# Patient Record
Sex: Male | Born: 1965
Health system: Southern US, Community
[De-identification: ages and names within clinical notes are randomized; demographics above are authoritative.]

## PROBLEM LIST (undated history)

## (undated) DIAGNOSIS — M549 Dorsalgia, unspecified: Secondary | ICD-10-CM

## (undated) DIAGNOSIS — K219 Gastro-esophageal reflux disease without esophagitis: Secondary | ICD-10-CM

## (undated) DIAGNOSIS — Z973 Presence of spectacles and contact lenses: Secondary | ICD-10-CM

## (undated) DIAGNOSIS — L409 Psoriasis, unspecified: Secondary | ICD-10-CM

## (undated) DIAGNOSIS — G8929 Other chronic pain: Secondary | ICD-10-CM

## (undated) DIAGNOSIS — I10 Essential (primary) hypertension: Secondary | ICD-10-CM

## (undated) DIAGNOSIS — G4730 Sleep apnea, unspecified: Secondary | ICD-10-CM

## (undated) DIAGNOSIS — K700 Alcoholic fatty liver: Secondary | ICD-10-CM

## (undated) DIAGNOSIS — F419 Anxiety disorder, unspecified: Secondary | ICD-10-CM

## (undated) DIAGNOSIS — M1990 Unspecified osteoarthritis, unspecified site: Secondary | ICD-10-CM

## (undated) DIAGNOSIS — I8500 Esophageal varices without bleeding: Secondary | ICD-10-CM

## (undated) DIAGNOSIS — H409 Unspecified glaucoma: Secondary | ICD-10-CM

## (undated) DIAGNOSIS — F1910 Other psychoactive substance abuse, uncomplicated: Secondary | ICD-10-CM

## (undated) DIAGNOSIS — L309 Dermatitis, unspecified: Secondary | ICD-10-CM

## (undated) HISTORY — DX: Other psychoactive substance abuse, uncomplicated: F19.10

## (undated) HISTORY — PX: MULTIPLE TOOTH EXTRACTIONS: SHX2053

## (undated) HISTORY — PX: ROTATOR CUFF REPAIR: SHX139

## (undated) HISTORY — DX: Unspecified osteoarthritis, unspecified site: M19.90

---

## 2005-08-19 ENCOUNTER — Ambulatory Visit (HOSPITAL_COMMUNITY): Admission: RE | Admit: 2005-08-19 | Discharge: 2005-08-19 | Payer: Self-pay | Admitting: Family Medicine

## 2008-06-13 ENCOUNTER — Ambulatory Visit (HOSPITAL_COMMUNITY): Admission: RE | Admit: 2008-06-13 | Discharge: 2008-06-13 | Payer: Self-pay | Admitting: Family Medicine

## 2008-09-04 ENCOUNTER — Ambulatory Visit: Admission: RE | Admit: 2008-09-04 | Discharge: 2008-09-04 | Payer: Self-pay | Admitting: Family Medicine

## 2009-07-02 ENCOUNTER — Emergency Department (HOSPITAL_COMMUNITY): Admission: EM | Admit: 2009-07-02 | Discharge: 2009-07-02 | Payer: Self-pay | Admitting: Emergency Medicine

## 2009-08-04 ENCOUNTER — Emergency Department (HOSPITAL_COMMUNITY): Admission: EM | Admit: 2009-08-04 | Discharge: 2009-08-04 | Payer: Self-pay | Admitting: Emergency Medicine

## 2010-04-05 ENCOUNTER — Emergency Department (HOSPITAL_COMMUNITY): Admission: EM | Admit: 2010-04-05 | Discharge: 2010-04-05 | Payer: Self-pay | Admitting: Emergency Medicine

## 2010-11-17 NOTE — Procedures (Signed)
NAMEJIDENNA, FIGGS             ACCOUNT NO.:  [id number]   MEDICAL RECORD NO.:  [id number]          PATIENT TYPE:  OUT   LOCATION:  SLEE                          FACILITY:  [facility]   PHYSICIAN:  Kofi A. Gerilyn Pilgrim, M.D. DATE OF BIRTH:  07/20/1965   DATE OF PROCEDURE:  09/04/2008  DATE OF DISCHARGE:  09/04/2008                             SLEEP DISORDER REPORT   NOCTURNAL POLYSOMNOGRAPHY REPORT   REFERRING PHYSICIAN:  Mila Homer. Sudie Bailey, M.D.   INDICATIONS:  This is a 45 year old man who presents with snoring,  hypersomnia, and is being evaluated for obstructive sleep apnea  syndrome.   MEDICATIONS:  1. Multivitamin.  2. Omega-3.  3. Fish oil.  4. Hydrocodone.  5. Ibuprofen.   Epworth sleepiness scale 16.  BMI 36.   ARCHITECTURAL SUMMARY:  This is a split night study with the first part  being a diagnostic, the second part being a titration study.  The total  recording time is 449 minutes.  Sleep efficiency 59%.  Sleep latency 24  minutes.  REM latency 180 minutes.   RESPIRATORY SUMMARY:  Baseline oxygen saturation 95 with the lowest  saturation 81.  The baseline AHI is 48.  The patient was subsequently  titrated between pressures of 6 and 11, optimal pressure of 11.   LIMB MOVEMENT SUMMARY:  PLM  index zero.   ELECTROCARDIOGRAM SUMMARY:  Average heart rate is 66 with occasional  PVCs observed.   IMPRESSION:  Severe obstructive sleep apnea syndrome, which responded  well to a continuous positive airway pressure of 11.     Kofi A. Gerilyn Pilgrim, M.D.  Electronically Signed    KAD/MEDQ  D:  09/07/2008  T:  09/08/2008  Job:  [id number]

## 2011-03-06 ENCOUNTER — Emergency Department (HOSPITAL_COMMUNITY)
Admission: EM | Admit: 2011-03-06 | Discharge: 2011-03-07 | Disposition: A | Discharge status: Home / Self Care | Payer: Self-pay | Attending: Emergency Medicine | Admitting: Emergency Medicine

## 2011-03-06 ENCOUNTER — Emergency Department (HOSPITAL_COMMUNITY): Payer: Self-pay

## 2011-03-06 ENCOUNTER — Encounter: Payer: Self-pay | Admitting: *Deleted

## 2011-03-06 ENCOUNTER — Other Ambulatory Visit: Payer: Self-pay

## 2011-03-06 DIAGNOSIS — R531 Weakness: Secondary | ICD-10-CM

## 2011-03-06 DIAGNOSIS — F17200 Nicotine dependence, unspecified, uncomplicated: Secondary | ICD-10-CM | POA: Insufficient documentation

## 2011-03-06 DIAGNOSIS — R079 Chest pain, unspecified: Secondary | ICD-10-CM | POA: Insufficient documentation

## 2011-03-06 DIAGNOSIS — E119 Type 2 diabetes mellitus without complications: Secondary | ICD-10-CM | POA: Insufficient documentation

## 2011-03-06 DIAGNOSIS — R5381 Other malaise: Secondary | ICD-10-CM | POA: Insufficient documentation

## 2011-03-06 DIAGNOSIS — R5383 Other fatigue: Secondary | ICD-10-CM | POA: Insufficient documentation

## 2011-03-06 DIAGNOSIS — B9789 Other viral agents as the cause of diseases classified elsewhere: Secondary | ICD-10-CM | POA: Insufficient documentation

## 2011-03-06 LAB — BASIC METABOLIC PANEL
BUN: 16 mg/dL (ref 6–23)
CO2: 28 mEq/L (ref 19–32)
Calcium: 9.2 mg/dL (ref 8.4–10.5)
Creatinine, Ser: 0.9 mg/dL (ref 0.50–1.35)
GFR calc non Af Amer: >60 mL/min (ref >60)
Glucose, Bld: 172 mg/dL — ABNORMAL HIGH (ref 70–99)

## 2011-03-06 LAB — CBC
Hemoglobin: 13.9 g/dL (ref 13.0–17.0)
MCH: 32.5 pg (ref 26.0–34.0)
MCHC: 34.5 g/dL (ref 30.0–36.0)
MCV: 94.2 fL (ref 78.0–100.0)
RBC: 4.28 MIL/uL (ref 4.22–5.81)

## 2011-03-06 MED ORDER — MORPHINE SULFATE 2 MG/ML IJ SOLN
2.0000 mg | Freq: Once | INTRAMUSCULAR | Status: AC
  Administered 2011-03-06: 2 mg via INTRAVENOUS
  Filled 2011-03-06: qty 1

## 2011-03-06 MED ORDER — SODIUM CHLORIDE 0.9 % IV SOLN: Freq: Once | INTRAVENOUS | Status: DC

## 2011-03-06 MED ORDER — ONDANSETRON HCL 4 MG/2ML IJ SOLN
4.0000 mg | Freq: Once | INTRAMUSCULAR | Status: AC
  Administered 2011-03-06: 4 mg via INTRAVENOUS
  Filled 2011-03-06: qty 2

## 2011-03-06 MED ORDER — SODIUM CHLORIDE 0.9 % IV BOLUS (SEPSIS)
1000.0000 mL | Freq: Once | INTRAVENOUS | Status: AC
  Administered 2011-03-06: 1000 mL via INTRAVENOUS

## 2011-03-06 MED ORDER — KETOROLAC TROMETHAMINE 30 MG/ML IJ SOLN
30.0000 mg | Freq: Once | INTRAMUSCULAR | Status: AC
  Administered 2011-03-06: 30 mg via INTRAVENOUS
  Filled 2011-03-06: qty 1

## 2011-03-06 NOTE — ED Notes (Signed)
Pt c/o decreased energy x 2days. Pt also c./o intermittent chest tightness, nausea, dizziness, and lightheaded since 5:30 pm today.

## 2011-03-06 NOTE — ED Provider Notes (Signed)
History     CSN: [id number] Arrival date & time: 03/06/2011 10:39 PM  Chief Complaint  Patient presents with  . Chest Pain  . Nausea  . Headache   Patient is a 45 y.o. male presenting with weakness. The history is provided by the patient and the spouse.  Weakness The primary symptoms include dizziness. Primary symptoms comment: Patient feeling general malaise all day today. Went to work tonight, felt lightheaded, tired, was diaphoretic.  The symptoms began 12 to 24 hours ago. The symptoms are unchanged.  Dizziness also occurs with weakness.   Additional symptoms include weakness. Medical issues also include hypertension. Associated medical issues comments: Patient was checked by work nurse when he experienced the weakness and diaphoresis. His blood pressure was elevated  with diastolic of 112. He is not  on antihpertensives..    Past Medical History  Diagnosis Date  . Diabetes mellitus     diet controlled    Past Surgical History  Procedure Date  . Rotator cuff repair     Family History  Problem Relation Age of Onset  . Diabetes Father     History  Substance Use Topics  . Smoking status: Current Some Day Smoker  . Smokeless tobacco: Not on file  . Alcohol Use: No      Review of Systems  Neurological: Positive for dizziness and weakness.  All other systems reviewed and are negative.    Physical Exam  BP 169/98  Pulse 69  Temp(Src) 98.3 F (36.8 C) (Oral)  Resp 20  Ht 5' 6.5" (1.689 m)  Wt 220 lb (99.791 kg)  BMI 34.98 kg/m2  SpO2 98%  Physical Exam  Nursing note and vitals reviewed. Constitutional: He is oriented to person, place, and time. He appears well-developed and well-nourished. No distress.  HENT:  Head: Normocephalic.  Right Ear: External ear normal.  Mouth/Throat: Oropharynx is clear and moist.  Eyes: EOM are normal.  Neck: Normal range of motion.  Cardiovascular: Normal rate, normal heart sounds and intact distal pulses.     Pulmonary/Chest: Effort normal. No respiratory distress. He has no wheezes. He has no rales. He exhibits no tenderness.  Abdominal: Soft.  Musculoskeletal: Normal range of motion.  Neurological: He is alert and oriented to person, place, and time.  Skin: Skin is warm and dry.    ED Course  Procedures   Date: 03/06/2011 2251  Rate: 65  Rhythm: normal sinus rhythm  QRS Axis: normal  Intervals: normal  ST/T Wave abnormalities: normal  Conduction Disutrbances:none  Narrative Interpretation:   Old EKG Reviewed: none available  Results for orders placed during the hospital encounter of 03/06/11  CBC      Component Value Range   WBC 11.0 (*) 4.0 - 10.5 (K/uL)   RBC 4.28  4.22 - 5.81 (MIL/uL)   Hemoglobin 13.9  13.0 - 17.0 (g/dL)   HCT 40.9  81.1 - 91.4 (%)   MCV 94.2  78.0 - 100.0 (fL)   MCH 32.5  26.0 - 34.0 (pg)   MCHC 34.5  30.0 - 36.0 (g/dL)   RDW 78.2  95.6 - 21.3 (%)   Platelets 177  150 - 400 (K/uL)  BASIC METABOLIC PANEL      Component Value Range   Sodium 131 (*) 135 - 145 (mEq/L)   Potassium 3.8  3.5 - 5.1 (mEq/L)   Chloride 94 (*) 96 - 112 (mEq/L)   CO2 28  19 - 32 (mEq/L)   Glucose, Bld 172 (*) 70 -  99 (mg/dL)   BUN 16  6 - 23 (mg/dL)   Creatinine, Ser 1.61  0.50 - 1.35 (mg/dL)   Calcium 9.2  8.4 - 09.6 (mg/dL)   GFR calc non Af Amer >60  >60 (mL/min)   GFR calc Af Amer >60  >60 (mL/min)  CARDIAC PANEL(CRET KIN+CKTOT+MB+TROPI)      Component Value Range   Total CK 185  7 - 232 (U/L)   CK, MB 4.1 (*) 0.3 - 4.0 (ng/mL)   Troponin I <0.30  <0.30 (ng/mL)   Relative Index 2.2  0.0 - 2.5    Dg Chest Portable 1 View  03/06/2011  *RADIOLOGY REPORT*  Clinical Data: Chest pain.  Short of breath.  PORTABLE CHEST - 1 VIEW  Comparison: None.  Findings: Shallow inspiration.  Borderline heart size with normal pulmonary vascularity.  No focal airspace consolidation in the lungs.  No pneumothorax.  No blunting of costophrenic angles.  IMPRESSION: No evidence of active  pulmonary disease.  Original Report Authenticated By: Marlon Pel, M.D.   Patient with generalized weakness, fatigue, lightheadedness that began today. Labs are unremarkable, EKG and chest xray normal. Patient has received IVF, taken PO fluids. He has ambulated in the department. Initially hypertensive, improvement while here. Pt feels improved after observation and/or treatment in ED.  MDM Reviewed: nursing note and vitals Interpretation: labs, ECG and x-ray     Nicoletta Dress. Colon Branch, MD 03/07/11 0454

## 2012-04-19 ENCOUNTER — Encounter (HOSPITAL_COMMUNITY): Payer: Self-pay | Admitting: *Deleted

## 2012-04-19 ENCOUNTER — Inpatient Hospital Stay (HOSPITAL_COMMUNITY)
Admission: EM | Admit: 2012-04-19 | Discharge: 2012-04-22 | DRG: 757 | Disposition: A | Discharge status: Home / Self Care | Payer: BC Managed Care – PPO | Source: Ambulatory Visit | Attending: Family Medicine | Admitting: Family Medicine

## 2012-04-19 ENCOUNTER — Emergency Department (HOSPITAL_COMMUNITY): Payer: BC Managed Care – PPO

## 2012-04-19 DIAGNOSIS — M5020 Other cervical disc displacement, unspecified cervical region: Principal | ICD-10-CM | POA: Diagnosis present

## 2012-04-19 DIAGNOSIS — G8929 Other chronic pain: Secondary | ICD-10-CM | POA: Diagnosis present

## 2012-04-19 DIAGNOSIS — F17200 Nicotine dependence, unspecified, uncomplicated: Secondary | ICD-10-CM | POA: Diagnosis present

## 2012-04-19 DIAGNOSIS — E876 Hypokalemia: Secondary | ICD-10-CM | POA: Diagnosis present

## 2012-04-19 DIAGNOSIS — I10 Essential (primary) hypertension: Secondary | ICD-10-CM | POA: Diagnosis present

## 2012-04-19 DIAGNOSIS — E871 Hypo-osmolality and hyponatremia: Secondary | ICD-10-CM | POA: Diagnosis present

## 2012-04-19 DIAGNOSIS — IMO0001 Reserved for inherently not codable concepts without codable children: Secondary | ICD-10-CM | POA: Diagnosis present

## 2012-04-19 DIAGNOSIS — E8881 Metabolic syndrome: Secondary | ICD-10-CM | POA: Diagnosis present

## 2012-04-19 DIAGNOSIS — R079 Chest pain, unspecified: Secondary | ICD-10-CM

## 2012-04-19 DIAGNOSIS — M5030 Other cervical disc degeneration, unspecified cervical region: Secondary | ICD-10-CM | POA: Diagnosis present

## 2012-04-19 DIAGNOSIS — K219 Gastro-esophageal reflux disease without esophagitis: Secondary | ICD-10-CM | POA: Diagnosis present

## 2012-04-19 DIAGNOSIS — Y92009 Unspecified place in unspecified non-institutional (private) residence as the place of occurrence of the external cause: Secondary | ICD-10-CM

## 2012-04-19 DIAGNOSIS — M5124 Other intervertebral disc displacement, thoracic region: Secondary | ICD-10-CM | POA: Diagnosis present

## 2012-04-19 DIAGNOSIS — E785 Hyperlipidemia, unspecified: Secondary | ICD-10-CM | POA: Diagnosis present

## 2012-04-19 DIAGNOSIS — E669 Obesity, unspecified: Secondary | ICD-10-CM | POA: Diagnosis present

## 2012-04-19 DIAGNOSIS — Z79899 Other long term (current) drug therapy: Secondary | ICD-10-CM

## 2012-04-19 DIAGNOSIS — Z888 Allergy status to other drugs, medicaments and biological substances status: Secondary | ICD-10-CM

## 2012-04-19 DIAGNOSIS — F3289 Other specified depressive episodes: Secondary | ICD-10-CM | POA: Diagnosis present

## 2012-04-19 DIAGNOSIS — T502X5A Adverse effect of carbonic-anhydrase inhibitors, benzothiadiazides and other diuretics, initial encounter: Secondary | ICD-10-CM | POA: Diagnosis present

## 2012-04-19 DIAGNOSIS — F411 Generalized anxiety disorder: Secondary | ICD-10-CM | POA: Diagnosis present

## 2012-04-19 DIAGNOSIS — M25519 Pain in unspecified shoulder: Secondary | ICD-10-CM | POA: Diagnosis present

## 2012-04-19 DIAGNOSIS — F329 Major depressive disorder, single episode, unspecified: Secondary | ICD-10-CM | POA: Diagnosis present

## 2012-04-19 LAB — TROPONIN I: Troponin I: <0.3 ng/mL (ref <0.30)

## 2012-04-19 LAB — CBC WITH DIFFERENTIAL/PLATELET
Eosinophils Relative: 6 % — ABNORMAL HIGH (ref 0–5)
HCT: 40.2 % (ref 39.0–52.0)
Hemoglobin: 14 g/dL (ref 13.0–17.0)
Lymphocytes Relative: 35 % (ref 12–46)
Lymphs Abs: 1.9 10*3/uL (ref 0.7–4.0)
MCV: 92.8 fL (ref 78.0–100.0)
Monocytes Absolute: 0.5 10*3/uL (ref 0.1–1.0)
Monocytes Relative: 9 % (ref 3–12)
Platelets: 212 10*3/uL (ref 150–400)
RBC: 4.33 MIL/uL (ref 4.22–5.81)
WBC: 5.5 10*3/uL (ref 4.0–10.5)

## 2012-04-19 LAB — D-DIMER, QUANTITATIVE: D-Dimer, Quant: <0.27 ug/mL-FEU (ref 0.00–0.48)

## 2012-04-19 LAB — BASIC METABOLIC PANEL
BUN: 10 mg/dL (ref 6–23)
CO2: 26 mEq/L (ref 19–32)
Calcium: 10 mg/dL (ref 8.4–10.5)
Glucose, Bld: 125 mg/dL — ABNORMAL HIGH (ref 70–99)
Sodium: 132 mEq/L — ABNORMAL LOW (ref 135–145)

## 2012-04-19 LAB — GLUCOSE, CAPILLARY

## 2012-04-19 MED ORDER — OXYCODONE HCL 5 MG PO TABS
5.0000 mg | ORAL_TABLET | ORAL | Status: DC | PRN
  Administered 2012-04-19 – 2012-04-23 (×6): 5 mg via ORAL
  Filled 2012-04-19 (×7): qty 1

## 2012-04-19 MED ORDER — INSULIN ASPART 100 UNIT/ML ~~LOC~~ SOLN
0.0000 [IU] | Freq: Every day | SUBCUTANEOUS | Status: DC
Start: 2012-04-19 — End: 2012-04-23
  Administered 2012-04-22: 2 [IU] via SUBCUTANEOUS

## 2012-04-19 MED ORDER — ACETAMINOPHEN 650 MG RE SUPP: 650.0000 mg | Freq: Four times a day (QID) | RECTAL | Status: DC | PRN

## 2012-04-19 MED ORDER — ACETAMINOPHEN 325 MG PO TABS
650.0000 mg | ORAL_TABLET | Freq: Four times a day (QID) | ORAL | Status: DC | PRN
  Administered 2012-04-20: 650 mg via ORAL
  Filled 2012-04-19: qty 2

## 2012-04-19 MED ORDER — HYDROMORPHONE HCL PF 1 MG/ML IJ SOLN
2.0000 mg | INTRAMUSCULAR | Status: DC | PRN
  Administered 2012-04-19 – 2012-04-20 (×6): 2 mg via INTRAVENOUS
  Filled 2012-04-19: qty 2
  Filled 2012-04-19: qty 1
  Filled 2012-04-19 (×2): qty 2
  Filled 2012-04-19: qty 1
  Filled 2012-04-19 (×2): qty 2

## 2012-04-19 MED ORDER — ASPIRIN 81 MG PO CHEW
324.0000 mg | CHEWABLE_TABLET | Freq: Once | ORAL | Status: AC
  Administered 2012-04-19: 324 mg via ORAL
  Filled 2012-04-19: qty 4

## 2012-04-19 MED ORDER — SODIUM CHLORIDE 0.9 % IV SOLN
INTRAVENOUS | Status: DC
  Administered 2012-04-19: 1000 mL via INTRAVENOUS
  Administered 2012-04-20: 11:00:00 via INTRAVENOUS
  Administered 2012-04-21: 1000 mL via INTRAVENOUS

## 2012-04-19 MED ORDER — SODIUM CHLORIDE 0.9 % IJ SOLN
3.0000 mL | Freq: Two times a day (BID) | INTRAMUSCULAR | Status: DC
  Administered 2012-04-19 – 2012-04-22 (×4): 3 mL via INTRAVENOUS
  Filled 2012-04-19 (×2): qty 3

## 2012-04-19 MED ORDER — LOSARTAN POTASSIUM 50 MG PO TABS
50.0000 mg | ORAL_TABLET | Freq: Every day | ORAL | Status: DC
  Administered 2012-04-19 – 2012-04-22 (×4): 50 mg via ORAL
  Filled 2012-04-19 (×5): qty 1

## 2012-04-19 MED ORDER — MORPHINE SULFATE 4 MG/ML IJ SOLN
4.0000 mg | Freq: Once | INTRAMUSCULAR | Status: AC
  Administered 2012-04-19: 4 mg via INTRAVENOUS
  Filled 2012-04-19: qty 1

## 2012-04-19 MED ORDER — PANTOPRAZOLE SODIUM 40 MG PO TBEC
40.0000 mg | DELAYED_RELEASE_TABLET | Freq: Every day | ORAL | Status: DC
  Administered 2012-04-20 – 2012-04-22 (×3): 40 mg via ORAL
  Filled 2012-04-19 (×3): qty 1

## 2012-04-19 MED ORDER — INSULIN ASPART 100 UNIT/ML ~~LOC~~ SOLN
0.0000 [IU] | Freq: Three times a day (TID) | SUBCUTANEOUS | Status: DC
  Administered 2012-04-20 (×2): 2 [IU] via SUBCUTANEOUS
  Administered 2012-04-21 – 2012-04-22 (×2): 3 [IU] via SUBCUTANEOUS
  Administered 2012-04-22 (×2): 2 [IU] via SUBCUTANEOUS

## 2012-04-19 MED ORDER — LORAZEPAM 1 MG PO TABS
2.0000 mg | ORAL_TABLET | Freq: Four times a day (QID) | ORAL | Status: DC | PRN
  Administered 2012-04-19: 2 mg via ORAL
  Filled 2012-04-19: qty 1

## 2012-04-19 MED ORDER — ASPIRIN EC 325 MG PO TBEC
325.0000 mg | DELAYED_RELEASE_TABLET | Freq: Every day | ORAL | Status: DC
  Administered 2012-04-20 – 2012-04-22 (×3): 325 mg via ORAL
  Filled 2012-04-19 (×4): qty 1

## 2012-04-19 MED ORDER — NITROGLYCERIN 0.4 MG SL SUBL
0.4000 mg | SUBLINGUAL_TABLET | Freq: Once | SUBLINGUAL | Status: AC
  Administered 2012-04-19: 0.4 mg via SUBLINGUAL
  Filled 2012-04-19: qty 25

## 2012-04-19 MED ORDER — CITALOPRAM HYDROBROMIDE 20 MG PO TABS
20.0000 mg | ORAL_TABLET | Freq: Every day | ORAL | Status: DC
  Administered 2012-04-19 – 2012-04-22 (×4): 20 mg via ORAL
  Filled 2012-04-19 (×5): qty 1

## 2012-04-19 MED ORDER — ENOXAPARIN SODIUM 40 MG/0.4ML ~~LOC~~ SOLN
40.0000 mg | SUBCUTANEOUS | Status: DC
  Administered 2012-04-19 – 2012-04-20 (×2): 40 mg via SUBCUTANEOUS
  Filled 2012-04-19 (×3): qty 0.4

## 2012-04-19 MED ORDER — HYDROCHLOROTHIAZIDE 25 MG PO TABS
25.0000 mg | ORAL_TABLET | Freq: Every evening | ORAL | Status: DC
  Administered 2012-04-19 – 2012-04-22 (×3): 25 mg via ORAL
  Filled 2012-04-19 (×5): qty 1

## 2012-04-19 MED ORDER — ONDANSETRON HCL 4 MG/2ML IJ SOLN
4.0000 mg | Freq: Once | INTRAMUSCULAR | Status: AC
  Administered 2012-04-19: 4 mg via INTRAVENOUS
  Filled 2012-04-19: qty 2

## 2012-04-19 NOTE — ED Notes (Signed)
Pt c/o left underarm area pain that radiates down left arm into left pinkie finger, causing left pinkie finger to experience numbness at times, pt states that it is hard for him to make a fist with left hand due to weakness in left arm area, pt does admit to pain in neck area at times, denies any injury. Pain started a few days ago, denies any n/v, sob, does admit to diaphoresis at times. ekg performed in triage

## 2012-04-19 NOTE — ED Notes (Signed)
Pt reports has had some left sided chest pain and pain in left shoulder for the past 3 days.  Today woke up around 1pm with severe pain in left side of chest radiating to left shoulder, shoulder blade, and down left arm.  Reports numbness in left pinky.  Denies SOB or nausea.  Denies any injury.  Denies anything making pain better or worse.  Reports has had episodes of breaking out in a sweat.  Describes as sharp pain.

## 2012-04-19 NOTE — ED Provider Notes (Signed)
History     CSN: [id number]  Arrival date & time 04/19/12  1615   First MD Initiated Contact with Patient 04/19/12 1642      Chief Complaint  Patient presents with  . Shoulder Pain  . Neck Pain    (Consider location/radiation/quality/duration/timing/severity/associated sxs/prior treatment) HPI...... left anterior chest pain with radiation to shoulder and left upper arm with associated diaphoresis;  no nausea or dyspnea.  Cardiac risk factors include lipids, diabetes, hypertension, cigarette smoking.  Symptoms started 3-4 days ago and are worsening. Now constant.  Nothing makes it better or worse.  He works third shift at [employer].  Past Medical History  Diagnosis Date  . Diabetes mellitus     diet controlled    Past Surgical History  Procedure Date  . Rotator cuff repair     Family History  Problem Relation Age of Onset  . Diabetes Father     History  Substance Use Topics  . Smoking status: Current Some Day Smoker  . Smokeless tobacco: Not on file  . Alcohol Use: No      Review of Systems  All other systems reviewed and are negative.    Allergies  Neosporin + pain relief max st  Home Medications   Current Outpatient Rx  Name Route Sig Dispense Refill  . CITALOPRAM HYDROBROMIDE 20 MG PO TABS Oral Take 20 mg by mouth daily.      Marland Kitchen HYDROCHLOROTHIAZIDE 25 MG PO TABS Oral Take 25 mg by mouth every evening.    Marland Kitchen HYDROCODONE-ACETAMINOPHEN 10-500 MG PO TABS Oral Take 1 tablet by mouth every 6 (six) hours as needed. For pain    . IBUPROFEN 800 MG PO TABS Oral Take 800 mg by mouth every 8 (eight) hours as needed. For pain    . LORAZEPAM 2 MG PO TABS Oral Take 2 mg by mouth every 6 (six) hours as needed. For anxiety    . METFORMIN HCL 500 MG PO TABS Oral Take 500 mg by mouth daily.    . ADULT MULTIVITAMIN W/MINERALS CH Oral Take 1 tablet by mouth daily.    . OXYCODONE-ACETAMINOPHEN 7.5-325 MG PO TABS Oral Take 1 tablet by mouth every 4 (four) hours as needed. For  pain      BP 132/85  Pulse 79  Temp 98 F (36.7 C) (Oral)  Resp 22  Ht 5\' 7"  (1.702 m)  Wt 197 lb (89.359 kg)  BMI 30.85 kg/m2  SpO2 100%  Physical Exam  Nursing note and vitals reviewed. Constitutional: He is oriented to person, place, and time. He appears well-developed and well-nourished.  HENT:  Head: Normocephalic and atraumatic.  Eyes: Conjunctivae normal and EOM are normal. Pupils are equal, round, and reactive to light.  Neck: Normal range of motion. Neck supple.  Cardiovascular: Normal rate, regular rhythm and normal heart sounds.   Pulmonary/Chest: Effort normal and breath sounds normal.  Abdominal: Soft. Bowel sounds are normal.  Musculoskeletal: Normal range of motion.  Neurological: He is alert and oriented to person, place, and time.  Skin: Skin is warm and dry.  Psychiatric: He has a normal mood and affect.    ED Course  Procedures (including critical care time)  Labs Reviewed  CBC WITH DIFFERENTIAL - Abnormal; Notable for the following:    Eosinophils Relative 6 (*)     All other components within normal limits  BASIC METABOLIC PANEL - Abnormal; Notable for the following:    Sodium 132 (*)     Potassium 3.4 (*)  Glucose, Bld 125 (*)     All other components within normal limits  TROPONIN I   Dg Chest Portable 1 View  04/19/2012  *RADIOLOGY REPORT*  Clinical Data: Chest pain.  PORTABLE CHEST - 1 VIEW  Comparison: 03/06/2011.  Findings: The cardiac silhouette, mediastinal and hilar contours are within normal limits and stable.  The lungs are clear.  No pleural effusion.  The bony thorax is intact.  IMPRESSION: Normal chest x-ray.   Original Report Authenticated By: P. Loralie Champagne, M.D.      No diagnosis found.   Date: 04/19/2012  Rate:76  Rhythm: normal sinus rhythm  QRS Axis: normal  Intervals: normal  ST/T Wave abnormalities: normal  Conduction Disutrbances: none  Narrative Interpretation: unremarkable     MDM  Patient has  substantial risk factor profile for acute coronary syndrome.  EKG normal. Troponin normal.  Nitroglycerin seemed to help.  Discussed with Dr. [name]. Admit.        Donnetta Hutching, MD 04/19/12 1836

## 2012-04-19 NOTE — ED Notes (Signed)
Dr. Adriana Simas states that temp. Orders placed for pt to go upstairs

## 2012-04-20 ENCOUNTER — Observation Stay (HOSPITAL_COMMUNITY): Payer: BC Managed Care – PPO

## 2012-04-20 DIAGNOSIS — R072 Precordial pain: Secondary | ICD-10-CM

## 2012-04-20 DIAGNOSIS — R079 Chest pain, unspecified: Secondary | ICD-10-CM

## 2012-04-20 LAB — BASIC METABOLIC PANEL
CO2: 28 mEq/L (ref 19–32)
Calcium: 9.2 mg/dL (ref 8.4–10.5)
Creatinine, Ser: 0.8 mg/dL (ref 0.50–1.35)
GFR calc non Af Amer: >90 mL/min (ref >90)
Sodium: 135 mEq/L (ref 135–145)

## 2012-04-20 LAB — GLUCOSE, CAPILLARY
Glucose-Capillary: 134 mg/dL — ABNORMAL HIGH (ref 70–99)
Glucose-Capillary: 194 mg/dL — ABNORMAL HIGH (ref 70–99)
Glucose-Capillary: 132 mg/dL — ABNORMAL HIGH (ref 70–99)

## 2012-04-20 LAB — HEMOGLOBIN A1C
Hgb A1c MFr Bld: 7.1 % — ABNORMAL HIGH (ref <5.7)
Mean Plasma Glucose: 157 mg/dL — ABNORMAL HIGH (ref <117)

## 2012-04-20 MED ORDER — POTASSIUM CHLORIDE CRYS ER 20 MEQ PO TBCR
20.0000 meq | EXTENDED_RELEASE_TABLET | Freq: Every day | ORAL | Status: DC
  Administered 2012-04-20 – 2012-04-22 (×3): 20 meq via ORAL
  Filled 2012-04-20 (×4): qty 1

## 2012-04-20 MED ORDER — SODIUM CHLORIDE 0.9 % IJ SOLN
INTRAMUSCULAR | Status: AC
  Administered 2012-04-20: 10 mL
  Filled 2012-04-20: qty 3

## 2012-04-20 MED ORDER — HYDROMORPHONE HCL PF 2 MG/ML IJ SOLN
4.0000 mg | INTRAMUSCULAR | Status: DC | PRN
  Administered 2012-04-20 – 2012-04-21 (×4): 4 mg via INTRAVENOUS
  Filled 2012-04-20: qty 4
  Filled 2012-04-20 (×3): qty 2

## 2012-04-20 MED ORDER — ALPRAZOLAM 0.5 MG PO TABS
1.0000 mg | ORAL_TABLET | Freq: Four times a day (QID) | ORAL | Status: DC | PRN
  Administered 2012-04-21 (×2): 1 mg via ORAL
  Filled 2012-04-20: qty 1
  Filled 2012-04-20: qty 2

## 2012-04-20 NOTE — Care Management Note (Signed)
    Page 1 of 1   04/21/2012     11:32:39 AM   CARE MANAGEMENT NOTE 04/21/2012  Patient:  Shawn Velazquez, Shawn Velazquez   Account Number:  [id number]  Date Initiated:  04/20/2012  Documentation initiated by:  Sharrie Rothman  Subjective/Objective Assessment:   Pt admitted from home with CP. Pt lives with wife and will return home at discharge. Pt is independent with ADL's. Pt does have cpap for home use.     Action/Plan:   No CM or HH needs noted.   Anticipated DC Date:  04/21/2012   Anticipated DC Plan:  ACUTE TO ACUTE TRANS      DC Planning Services  CM consult      Choice offered to / List presented to:             Status of service:  Completed, signed off Medicare Important Message given?   (If response is "NO", the following Medicare IM given date fields will be blank) Date Medicare IM given:   Date Additional Medicare IM given:    Discharge Disposition:  ACUTE TO ACUTE TRANS  Per UR Regulation:    If discussed at Long Length of Stay Meetings, dates discussed:    Comments:  04/20/12 1500 Tammy Blackwell, RNBSN CM

## 2012-04-20 NOTE — Progress Notes (Signed)
Patient ID: BERTIL BRICKEY, male   DOB: 1966/02/12, 46 y.o.   MRN: [id number]   CARDIOLOGY CONSULT NOTE  Patient ID: PSALM SCHAPPELL MRN: [id number], DOB/AGE: 01/02/1966   Admit date: 04/19/2012 Date of Consult: 04/20/2012   Primary Physician: No primary provider on file. Primary Cardiologist: none  Pt. Profile Pleasant 46 year old married white male admitted with left shoulder and arm pain. Asked to consult by Dr. [name] for possible coronary artery disease with multiple risk factors.    Problem List  Past Medical History  Diagnosis Date  . Diabetes mellitus     diet controlled    Past Surgical History  Procedure Date  . Rotator cuff repair      Allergies  Allergies  Allergen Reactions  . Neosporin + Pain Relief Max St (Neomy-Bacit-Polymyx-Pramoxine) Rash    HPI  Discomfort began in his left shoulder then down into his arm and then into his 2 small fingers. It has been constant for the last day or so.  He has had one episode of diaphoresis per the nurse here. No other symptoms.  His neck hurts when he hyperextends it. There no other neurological complaints.  He does have multiple cardiac risk factors including age, sex, tobacco use, type 2 diabetes which is well-controlled per his wife for 2 years, hyperlipidemia but not on therapy at the present time, and hypertension.  Troponin has been negative. EKG is normal. Echocardiogram done today and pending.  He denies any prehospital exertional symptoms consistent with angina or ischemic heart disease.  Inpatient Medications     . aspirin  324 mg Oral Once  . aspirin EC  325 mg Oral Daily  . citalopram  20 mg Oral Daily  . enoxaparin (LOVENOX) injection  40 mg Subcutaneous Q24H  . hydrochlorothiazide  25 mg Oral QPM  . insulin aspart  0-15 Units Subcutaneous TID WC  . insulin aspart  0-5 Units Subcutaneous QHS  . losartan  50 mg Oral Daily  .  morphine injection  4 mg Intravenous Once  . nitroGLYCERIN   0.4 mg Sublingual Once  . ondansetron  4 mg Intravenous Once  . pantoprazole  40 mg Oral Daily  . sodium chloride  3 mL Intravenous Q12H    Family History Family History  Problem Relation Age of Onset  . Diabetes Father      Social History He is married. She has several children. He works night shift. He has several grandchildren. He smokes. He quit earlier for 5 years. He does not drink. His daughter is an echo Best boy at [employer].   Review of Systems  General:  No chills, fever, night sweats or weight changes.  Cardiovascular:  No chest pain, dyspnea on exertion, edema, orthopnea, palpitations, paroxysmal nocturnal dyspnea. Dermatological: No rash, lesions/masses Respiratory: No cough, dyspnea Urologic: No hematuria, dysuria Abdominal:   No nausea, vomiting, diarrhea, bright red blood per rectum, melena, or hematemesis Neurologic:  No visual changes, wkns, changes in mental status. All other systems reviewed and are otherwise negative except as noted above.  Physical Exam  Blood pressure 131/80, pulse 71, temperature 97.6 F (36.4 C), temperature source Oral, resp. rate 12, height 5\' 7"  (1.702 m), weight 199 lb 6.4 oz (90.447 kg), SpO2 93.00%.  General: Pleasant, NAD, obese Psych: Normal affect. Neuro: Alert and oriented X 3. Moves all extremities spontaneously. HEENT: Normal  Neck: Neck discomfort worse when hyperextends. Lungs:  Resp regular and unlabored, CTA. Heart: RRR no s3, s4, or murmurs. Abdomen: Soft,  non-tender, non-distended, BS + x 4.  Extremities: No clubbing, cyanosis or edema. DP/PT/Radials 2+ and equal bilaterally.  Labs   [facility] 04/20/12 0831 04/20/12 0305 04/19/12 2115 04/19/12 1702  CKTOTAL -- -- -- --  CKMB -- -- -- --  TROPONINI <0.30 <0.30 <0.30 <0.30   Lab Results  Component Value Date   WBC 5.5 04/19/2012   HGB 14.0 04/19/2012   HCT 40.2 04/19/2012   MCV 92.8 04/19/2012   PLT 212 04/19/2012    Lab 04/20/12 0306  NA 135  K  3.4*  CL 98  CO2 28  BUN 9  CREATININE 0.80  CALCIUM 9.2  PROT --  BILITOT --  ALKPHOS --  ALT --  AST --  GLUCOSE 118*   No results found for this basename: CHOL, HDL, LDLCALC, TRIG   Lab Results  Component Value Date   DDIMER <0.27 04/19/2012    Radiology/Studies  Dg Chest Portable 1 View  04/19/2012  *RADIOLOGY REPORT*  Clinical Data: Chest pain.  PORTABLE CHEST - 1 VIEW  Comparison: 03/06/2011.  Findings: The cardiac silhouette, mediastinal and hilar contours are within normal limits and stable.  The lungs are clear.  No pleural effusion.  The bony thorax is intact.  IMPRESSION: Normal chest x-ray.   Original Report Authenticated By: P. Loralie Champagne, M.D.     ECG Normal sinus rhythm, normal EKG  ASSESSMENT AND PLAN  This is noncardiac discomfort. He is most likely from his cervical spine. Spine x-rays pending per family and nurse. Note that d-dimer was also negative.  I've had a nice discussion with the patient and his wife. With his multiple cardiac risk factors, I have strongly advised him to quit smoking and to get on a statin with Dr. Sudie Bailey. I've also advised aspirin 81 mg a day. We'll check on echo and if normal no further cardiac evaluation. Thank for the consultation. Blood sugar control and blood pressure control also emphasized.   Signed, Valera Castle, MD 04/20/2012, 1:14 PM

## 2012-04-20 NOTE — Progress Notes (Signed)
*  PRELIMINARY RESULTS* Echocardiogram 2D Echocardiogram has been performed.  Shawn Velazquez 04/20/2012, 10:45 AM

## 2012-04-20 NOTE — Progress Notes (Signed)
Shawn, Velazquez             ACCOUNT NO.:  [id number]  MEDICAL RECORD NO.:  [id number]  LOCATION:  [facility]                         FACILITY:  [facility]  PHYSICIAN:  Mila Homer. Sudie Bailey, M.D.DATE OF BIRTH:  Apr 18, 1966  DATE OF PROCEDURE: DATE OF DISCHARGE:                                PROGRESS NOTE   SUBJECTIVE:  This 46 year old was admitted to the hospital last night with chest pain.  Given multiple risk factors for coronary artery disease, there was concern he might be having a myocardial infarction.  He told me he has been having pains starting in the posterior cervical region radiating to the left shoulder and down the arm to the left hand now for 3 or 4 days.  This pain was initially intermittent and now has become almost constant.  The pain is so severe that 2 mg Dilaudid IV every 2 hours has not been totally controlling it.  The pain actually starts in the posterior cervical spine, by his account around C6-C7 and then radiates somewhat inferior to this by a couple of inches and then into the left shoulder and down the left arm to the hand where he has experienced weakness of the muscles of the hand and numbness of the fingers.  He really has not had pain in the anterior chest but has had some pain in the left pectoralis muscle.  OBJECTIVE:  VITAL SIGNS:  Temperature is 97.6, pulse 70, respiratory rate 18, blood pressure 140/80. HEART:  Regular rhythm and a rate of 70 without murmur. LUNGS:  Clear throughout, and he is moving air well. GENERAL:  His color is good.  His speech is normal.  He has had 2 EKGs, both essentially normal except on his last EKG, he had 1 PVC noted.  His cardiac enzymes have been negative with troponins all less than 0.30.  His BMP has been normal except for a potassium of 3.4.  Sugars have been high of 134, low of 100.  Admission CBC was normal.  His D-dimer was likewise normal.  Admission chest x-ray was felt to be normal.  ASSESSMENT: 1.  Probable cervical radiculopathy. 2. Type 2 diabetes. 3. Obesity. 4. Tobacco use disorder. 5. Hypercholesterolemia. 6. Benign essential hypertension. 7. Depression. 8. Chronic shoulder pain from an injury.  PLAN:  He has had his cardiac echo, but I do not have the results of this yet.  I have scheduled him for a cervical spine x-ray and a thoracic spine x-ray.  He will also need an MRI of the cervical spine to rule out disk, tumor, etc.  I discussed all this with the patient and his wife.  I am increasing his hydromorphone dose.     Mila Homer. Sudie Bailey, M.D.     SDK/MEDQ  D:  04/20/2012  T:  04/20/2012  Job:  [id number]

## 2012-04-20 NOTE — H&P (Signed)
Shawn Velazquez, Shawn Velazquez             ACCOUNT NO.:  [id number]  MEDICAL RECORD NO.:  [id number]  LOCATION:  [facility]                         FACILITY:  [facility]  PHYSICIAN:  Rojelio Uhrich L. [name], M.D.DATE OF BIRTH:  [birth year]  DATE OF ADMISSION:  04/19/2012 DATE OF DISCHARGE:  LH                             HISTORY & PHYSICAL   REASON FOR ADMISSION:  Chest pain.  HISTORY:  Shawn Velazquez is a 46 year old, who came to the emergency room because of chest discomfort.  This has been in the left anterior chest and into his arm and shoulder on the left.  He said it started 3-4 days ago.  It seems to be getting worse.  He has not noticed that it has been exertional.  He has not had nausea, vomiting, or diaphoresis associated with it.  PAST MEDICAL HISTORY:  Positive for multiple cardiac risk factors including diabetes, hypertension, hyperlipidemia, and he does smoke about a packet of cigarettes daily.  PAST SURGICAL HISTORY:  Surgically he has had a rotator cuff repair.  FAMILY HISTORY:  Positive for diabetes but he is not certain of any family history of heart disease.  SOCIAL HISTORY:  He does smoke about a packet of cigarettes daily.  He is employed.  He does not use alcohol.  REVIEW OF SYSTEMS:  Except as mentioned is essentially negative.  He does have a history of anxiety and depression as well.  At home, he takes citalopram 20 mg daily, hydrochlorothiazide 25 mg daily, hydrocodone with acetaminophen every 6 hours as needed for pain and oxycodone with acetaminophen for more severe pain, ibuprofen 800 mg every 8 hours, lorazepam 2 mg every 6 hours as needed, metformin 500 mg daily, a multiple vitamin.  PHYSICAL EXAMINATION:  HEENT:  His pupils are reactive.  Nose and throat are clear.  Mucous membranes are moist. NECK:  Supple without masses. GENERAL:  He appears to be in some acute distress due to pain. CHEST:  He does not have any distinct chest wall pain. HEART:  Regular. ABDOMEN:   Soft. EXTREMITIES:  Showed no edema. CENTRAL NERVOUS SYSTEM:  Grossly intact.  EKG did not show any acute abnormalities.  His chest pain may be from other problems.  He will have laboratory work to rule out myocardial infarction.  He will have an echocardiogram.  He will need cardiology consultation.  I am going to have him get a D-dimer and if it is elevated, he will need a CT chest.     [name] L. [name], M.D.     ELH/MEDQ  D:  04/19/2012  T:  04/20/2012  Job:  [id number]

## 2012-04-20 NOTE — Progress Notes (Signed)
UR Chart Review Completed  

## 2012-04-21 ENCOUNTER — Encounter (HOSPITAL_COMMUNITY): Payer: Self-pay | Admitting: Anesthesiology

## 2012-04-21 ENCOUNTER — Inpatient Hospital Stay (HOSPITAL_COMMUNITY): Payer: BC Managed Care – PPO | Admitting: Anesthesiology

## 2012-04-21 ENCOUNTER — Inpatient Hospital Stay (HOSPITAL_COMMUNITY): Payer: BC Managed Care – PPO

## 2012-04-21 ENCOUNTER — Encounter (HOSPITAL_COMMUNITY)
Admission: EM | Disposition: A | Discharge status: Home / Self Care | Payer: Self-pay | Source: Ambulatory Visit | Attending: Family Medicine

## 2012-04-21 HISTORY — PX: POSTERIOR CERVICAL LAMINECTOMY: SHX2248

## 2012-04-21 LAB — GLUCOSE, CAPILLARY: Glucose-Capillary: 108 mg/dL — ABNORMAL HIGH (ref 70–99)

## 2012-04-21 SURGERY — POSTERIOR CERVICAL LAMINECTOMY
Anesthesia: General | Site: Spine Cervical | Laterality: Left | Wound class: Clean

## 2012-04-21 MED ORDER — ROCURONIUM BROMIDE 100 MG/10ML IV SOLN
INTRAVENOUS | Status: DC | PRN
  Administered 2012-04-21: 50 mg via INTRAVENOUS

## 2012-04-21 MED ORDER — CEFAZOLIN SODIUM-DEXTROSE 2-3 GM-% IV SOLR
INTRAVENOUS | Status: DC | PRN
  Administered 2012-04-21: 2 g via INTRAVENOUS

## 2012-04-21 MED ORDER — BACITRACIN ZINC 500 UNIT/GM EX OINT
TOPICAL_OINTMENT | CUTANEOUS | Status: DC | PRN
  Administered 2012-04-21: 1 via TOPICAL

## 2012-04-21 MED ORDER — ONDANSETRON HCL 4 MG/2ML IJ SOLN: 4.0000 mg | INTRAMUSCULAR | Status: DC | PRN

## 2012-04-21 MED ORDER — HYDROMORPHONE HCL PF 1 MG/ML IJ SOLN
0.2500 mg | INTRAMUSCULAR | Status: DC | PRN
  Administered 2012-04-21 (×2): 0.5 mg via INTRAVENOUS

## 2012-04-21 MED ORDER — SODIUM CHLORIDE 0.9 % IJ SOLN: 3.0000 mL | INTRAMUSCULAR | Status: DC | PRN

## 2012-04-21 MED ORDER — HYDROMORPHONE HCL PF 1 MG/ML IJ SOLN
0.5000 mg | INTRAMUSCULAR | Status: DC | PRN
  Administered 2012-04-21 – 2012-04-22 (×5): 1 mg via INTRAVENOUS

## 2012-04-21 MED ORDER — CYCLOBENZAPRINE HCL 10 MG PO TABS
10.0000 mg | ORAL_TABLET | Freq: Three times a day (TID) | ORAL | Status: DC | PRN
  Administered 2012-04-21 – 2012-04-23 (×2): 10 mg via ORAL
  Filled 2012-04-21 (×2): qty 1

## 2012-04-21 MED ORDER — MENTHOL 3 MG MT LOZG: 1.0000 | LOZENGE | OROMUCOSAL | Status: DC | PRN

## 2012-04-21 MED ORDER — LIDOCAINE HCL (CARDIAC) 20 MG/ML IV SOLN
INTRAVENOUS | Status: DC | PRN
  Administered 2012-04-21: 100 mg via INTRAVENOUS

## 2012-04-21 MED ORDER — HYDROMORPHONE HCL PF 1 MG/ML IJ SOLN
3.0000 mg | INTRAMUSCULAR | Status: DC | PRN
  Administered 2012-04-21 (×2): 3 mg via INTRAVENOUS
  Filled 2012-04-21 (×3): qty 3

## 2012-04-21 MED ORDER — ONDANSETRON HCL 4 MG/2ML IJ SOLN
INTRAMUSCULAR | Status: DC | PRN
  Administered 2012-04-21: 4 mg via INTRAVENOUS

## 2012-04-21 MED ORDER — FENTANYL CITRATE 0.05 MG/ML IJ SOLN
INTRAMUSCULAR | Status: DC | PRN
  Administered 2012-04-21: 100 ug via INTRAVENOUS
  Administered 2012-04-21 (×2): 50 ug via INTRAVENOUS
  Administered 2012-04-21: 100 ug via INTRAVENOUS
  Administered 2012-04-21 (×2): 50 ug via INTRAVENOUS

## 2012-04-21 MED ORDER — ACETAMINOPHEN 325 MG PO TABS: 650.0000 mg | ORAL_TABLET | ORAL | Status: DC | PRN

## 2012-04-21 MED ORDER — THROMBIN 5000 UNITS EX KIT
PACK | CUTANEOUS | Status: DC | PRN
  Administered 2012-04-21 (×2): 5000 [IU] via TOPICAL

## 2012-04-21 MED ORDER — BUPIVACAINE HCL (PF) 0.25 % IJ SOLN
INTRAMUSCULAR | Status: DC | PRN
  Administered 2012-04-21: 6 mL

## 2012-04-21 MED ORDER — POTASSIUM CHLORIDE IN NACL 20-0.9 MEQ/L-% IV SOLN
INTRAVENOUS | Status: DC
  Administered 2012-04-21: 15:00:00 via INTRAVENOUS
  Filled 2012-04-21 (×2): qty 1000

## 2012-04-21 MED ORDER — SODIUM CHLORIDE 0.9 % IJ SOLN
3.0000 mL | Freq: Two times a day (BID) | INTRAMUSCULAR | Status: DC
  Administered 2012-04-22: 3 mL via INTRAVENOUS

## 2012-04-21 MED ORDER — PHENOL 1.4 % MT LIQD: 1.0000 | OROMUCOSAL | Status: DC | PRN

## 2012-04-21 MED ORDER — SODIUM CHLORIDE 0.9 % IV SOLN
250.0000 mL | INTRAVENOUS | Status: DC
  Administered 2012-04-21: 250 mL via INTRAVENOUS

## 2012-04-21 MED ORDER — LACTATED RINGERS IV SOLN
INTRAVENOUS | Status: DC | PRN
  Administered 2012-04-21: 18:00:00 via INTRAVENOUS

## 2012-04-21 MED ORDER — CYCLOBENZAPRINE HCL 10 MG PO TABS: 10.0000 mg | ORAL_TABLET | Freq: Three times a day (TID) | ORAL | Status: DC | PRN

## 2012-04-21 MED ORDER — MIDAZOLAM HCL 5 MG/5ML IJ SOLN
INTRAMUSCULAR | Status: DC | PRN
  Administered 2012-04-21: 2 mg via INTRAVENOUS

## 2012-04-21 MED ORDER — VECURONIUM BROMIDE 10 MG IV SOLR
INTRAVENOUS | Status: DC | PRN
  Administered 2012-04-21 (×3): 1 mg via INTRAVENOUS

## 2012-04-21 MED ORDER — ACETAMINOPHEN 650 MG RE SUPP: 650.0000 mg | RECTAL | Status: DC | PRN

## 2012-04-21 MED ORDER — 0.9 % SODIUM CHLORIDE (POUR BTL) OPTIME
TOPICAL | Status: DC | PRN
  Administered 2012-04-21: 1000 mL

## 2012-04-21 MED ORDER — SODIUM CHLORIDE 0.9 % IJ SOLN
3.0000 mL | Freq: Two times a day (BID) | INTRAMUSCULAR | Status: DC
  Administered 2012-04-21 (×2): 3 mL via INTRAVENOUS

## 2012-04-21 MED ORDER — HYDROMORPHONE HCL PF 1 MG/ML IJ SOLN
0.5000 mg | INTRAMUSCULAR | Status: DC | PRN
  Filled 2012-04-21 (×4): qty 1

## 2012-04-21 MED ORDER — PROPOFOL 10 MG/ML IV BOLUS
INTRAVENOUS | Status: DC | PRN
  Administered 2012-04-21: 200 mg via INTRAVENOUS

## 2012-04-21 MED ORDER — SODIUM CHLORIDE 0.9 % IR SOLN
Status: DC | PRN
  Administered 2012-04-21: 19:00:00

## 2012-04-21 MED ORDER — OXYCODONE-ACETAMINOPHEN 5-325 MG PO TABS
1.0000 | ORAL_TABLET | ORAL | Status: DC | PRN
  Administered 2012-04-21 – 2012-04-22 (×2): 2 via ORAL
  Administered 2012-04-22: 1 via ORAL
  Administered 2012-04-23 (×2): 2 via ORAL
  Filled 2012-04-21: qty 1
  Filled 2012-04-21 (×5): qty 2

## 2012-04-21 MED ORDER — SUCCINYLCHOLINE CHLORIDE 20 MG/ML IJ SOLN
INTRAMUSCULAR | Status: DC | PRN
  Administered 2012-04-21: 100 mg via INTRAVENOUS

## 2012-04-21 MED ORDER — HEMOSTATIC AGENTS (NO CHARGE) OPTIME
TOPICAL | Status: DC | PRN
  Administered 2012-04-21: 1 via TOPICAL

## 2012-04-21 MED ORDER — LIDOCAINE-EPINEPHRINE 1 %-1:100000 IJ SOLN
INTRAMUSCULAR | Status: DC | PRN
  Administered 2012-04-21: 9 mL

## 2012-04-21 MED ORDER — EPHEDRINE SULFATE 50 MG/ML IJ SOLN
INTRAMUSCULAR | Status: DC | PRN
  Administered 2012-04-21 (×2): 10 mg via INTRAVENOUS

## 2012-04-21 MED ORDER — CEFAZOLIN SODIUM 1-5 GM-% IV SOLN
1.0000 g | Freq: Three times a day (TID) | INTRAVENOUS | Status: AC
  Administered 2012-04-21 – 2012-04-22 (×2): 1 g via INTRAVENOUS
  Filled 2012-04-21 (×2): qty 50

## 2012-04-21 MED ORDER — SODIUM CHLORIDE 0.9 % IV SOLN: 250.0000 mL | INTRAVENOUS | Status: DC

## 2012-04-21 SURGICAL SUPPLY — 68 items
ADH SKN CLS APL DERMABOND .7 (GAUZE/BANDAGES/DRESSINGS) ×1
APL SKNCLS STERI-STRIP NONHPOA (GAUZE/BANDAGES/DRESSINGS) ×1
BAG DECANTER FOR FLEXI CONT (MISCELLANEOUS) ×2 IMPLANT
BENZOIN TINCTURE PRP APPL 2/3 (GAUZE/BANDAGES/DRESSINGS) ×3 IMPLANT
BLADE SURG 11 STRL SS (BLADE) ×1 IMPLANT
BLADE SURG ROTATE 9660 (MISCELLANEOUS) ×2 IMPLANT
BUR MATCHSTICK NEURO 3.0 LAGG (BURR) ×2 IMPLANT
CANISTER SUCTION 2500CC (MISCELLANEOUS) ×2 IMPLANT
CLOTH BEACON ORANGE TIMEOUT ST (SAFETY) ×2 IMPLANT
CONT SPEC 4OZ CLIKSEAL STRL BL (MISCELLANEOUS) ×2 IMPLANT
DECANTER SPIKE VIAL GLASS SM (MISCELLANEOUS) ×2 IMPLANT
DERMABOND ADVANCED (GAUZE/BANDAGES/DRESSINGS) ×1
DERMABOND ADVANCED .7 DNX12 (GAUZE/BANDAGES/DRESSINGS) ×1 IMPLANT
DRAPE LAPAROTOMY 100X72 PEDS (DRAPES) ×2 IMPLANT
DRAPE MICROSCOPE ZEISS OPMI (DRAPES) ×1 IMPLANT
DRAPE POUCH INSTRU U-SHP 10X18 (DRAPES) ×2 IMPLANT
DRAPE SURG 17X23 STRL (DRAPES) ×3 IMPLANT
DRSG OPSITE 4X5.5 SM (GAUZE/BANDAGES/DRESSINGS) ×4 IMPLANT
ELECT BLADE 4.0 EZ CLEAN MEGAD (MISCELLANEOUS) ×2
ELECT REM PT RETURN 9FT ADLT (ELECTROSURGICAL) ×2
ELECTRODE BLDE 4.0 EZ CLN MEGD (MISCELLANEOUS) IMPLANT
ELECTRODE REM PT RTRN 9FT ADLT (ELECTROSURGICAL) ×1 IMPLANT
EVACUATOR 1/8 PVC DRAIN (DRAIN) ×1 IMPLANT
GAUZE SPONGE 4X4 16PLY XRAY LF (GAUZE/BANDAGES/DRESSINGS) IMPLANT
GLOVE BIO SURGEON STRL SZ7.5 (GLOVE) ×1 IMPLANT
GLOVE BIO SURGEON STRL SZ8 (GLOVE) ×2 IMPLANT
GLOVE BIOGEL PI IND STRL 6.5 (GLOVE) IMPLANT
GLOVE BIOGEL PI IND STRL 7.0 (GLOVE) IMPLANT
GLOVE BIOGEL PI INDICATOR 6.5 (GLOVE) ×1
GLOVE BIOGEL PI INDICATOR 7.0 (GLOVE) ×1
GLOVE EXAM NITRILE LRG STRL (GLOVE) IMPLANT
GLOVE EXAM NITRILE MD LF STRL (GLOVE) ×1 IMPLANT
GLOVE EXAM NITRILE XL STR (GLOVE) IMPLANT
GLOVE EXAM NITRILE XS STR PU (GLOVE) IMPLANT
GLOVE INDICATOR 8.5 STRL (GLOVE) ×3 IMPLANT
GLOVE SS BIOGEL STRL SZ 6.5 (GLOVE) IMPLANT
GLOVE SUPERSENSE BIOGEL SZ 6.5 (GLOVE) ×2
GOWN BRE IMP SLV AUR LG STRL (GOWN DISPOSABLE) ×1 IMPLANT
GOWN BRE IMP SLV AUR XL STRL (GOWN DISPOSABLE) ×2 IMPLANT
GOWN STRL REIN 2XL LVL4 (GOWN DISPOSABLE) IMPLANT
KIT BASIN OR (CUSTOM PROCEDURE TRAY) ×2 IMPLANT
KIT ROOM TURNOVER OR (KITS) ×2 IMPLANT
MARKER SKIN DUAL TIP RULER LAB (MISCELLANEOUS) ×2 IMPLANT
NDL HYPO 25X1 1.5 SAFETY (NEEDLE) ×1 IMPLANT
NDL SPNL 20GX3.5 QUINCKE YW (NEEDLE) ×1 IMPLANT
NEEDLE HYPO 25X1 1.5 SAFETY (NEEDLE) ×2 IMPLANT
NEEDLE SPNL 20GX3.5 QUINCKE YW (NEEDLE) ×2 IMPLANT
NS IRRIG 1000ML POUR BTL (IV SOLUTION) ×2 IMPLANT
PACK LAMINECTOMY NEURO (CUSTOM PROCEDURE TRAY) ×2 IMPLANT
PAD ARMBOARD 7.5X6 YLW CONV (MISCELLANEOUS) ×6 IMPLANT
PIN MAYFIELD SKULL DISP (PIN) ×2 IMPLANT
RUBBERBAND STERILE (MISCELLANEOUS) ×2 IMPLANT
SPONGE GAUZE 4X4 12PLY (GAUZE/BANDAGES/DRESSINGS) ×1 IMPLANT
SPONGE LAP 4X18 X RAY DECT (DISPOSABLE) IMPLANT
SPONGE SURGIFOAM ABS GEL SZ50 (HEMOSTASIS) ×2 IMPLANT
STRIP CLOSURE SKIN 1/2X4 (GAUZE/BANDAGES/DRESSINGS) ×2 IMPLANT
SUT ETHILON 4 0 PS 2 18 (SUTURE) IMPLANT
SUT VIC AB 0 CT1 18XCR BRD8 (SUTURE) ×1 IMPLANT
SUT VIC AB 0 CT1 8-18 (SUTURE) ×2
SUT VIC AB 2-0 CT1 18 (SUTURE) ×2 IMPLANT
SUT VICRYL 4-0 PS2 18IN ABS (SUTURE) ×2 IMPLANT
SYR 20ML ECCENTRIC (SYRINGE) ×2 IMPLANT
TAPE CLOTH SURG 4X10 WHT LF (GAUZE/BANDAGES/DRESSINGS) ×1 IMPLANT
TAPE STRIPS DRAPE STRL (GAUZE/BANDAGES/DRESSINGS) ×1 IMPLANT
TOWEL OR 17X24 6PK STRL BLUE (TOWEL DISPOSABLE) ×2 IMPLANT
TOWEL OR 17X26 10 PK STRL BLUE (TOWEL DISPOSABLE) ×2 IMPLANT
TRAY FOLEY CATH 14FRSI W/METER (CATHETERS) IMPLANT
WATER STERILE IRR 1000ML POUR (IV SOLUTION) ×2 IMPLANT

## 2012-04-21 NOTE — Progress Notes (Signed)
Gave report to Posey Boyer, Industrial/product designer.  Pt to be transported via [facility] to [facility] Rm[address].  Sch[place], Candelaria Stagers 04/21/2012

## 2012-04-21 NOTE — Preoperative (Signed)
Beta Blockers   Reason not to administer Beta Blockers:Not Applicable 

## 2012-04-21 NOTE — Anesthesia Postprocedure Evaluation (Signed)
Anesthesia Post Note  Patient: Shawn Velazquez  Procedure(s) Performed: Procedure(s) (LRB): POSTERIOR CERVICAL LAMINECTOMY (Left)  Anesthesia type: general  Patient location: PACU  Post pain: Pain level controlled  Post assessment: Patient's Cardiovascular Status Stable  Last Vitals:  Filed Vitals:   04/21/12 2040  BP: 138/74  Pulse: 100  Temp: 36.8 C  Resp: 14    Post vital signs: Reviewed and stable  Level of consciousness: sedated  Complications: No apparent anesthesia complications

## 2012-04-21 NOTE — Anesthesia Preprocedure Evaluation (Addendum)
Anesthesia Evaluation  Patient identified by MRN, date of birth, ID band Patient awake    Reviewed: Allergy & Precautions, H&P , NPO status , Patient's Chart, lab work & pertinent test results, reviewed documented beta blocker date and time   History of Anesthesia Complications Negative for: history of anesthetic complications  Airway Mallampati: II TM Distance: >3 FB Neck ROM: Limited    Dental  (+) Edentulous Upper, Partial Lower and Dental Advisory Given   Pulmonary sleep apnea and Continuous Positive Airway Pressure Ventilation , Current Smoker,          Cardiovascular hypertension, Pt. on medications     Neuro/Psych    GI/Hepatic Neg liver ROS, GERD-  Medicated and Controlled,  Endo/Other  diabetes, Type 2, Oral Hypoglycemic Agents  Renal/GU negative Renal ROS     Musculoskeletal negative musculoskeletal ROS (+)   Abdominal   Peds  Hematology negative hematology ROS (+)   Anesthesia Other Findings   Reproductive/Obstetrics                          Anesthesia Physical Anesthesia Plan  ASA: III  Anesthesia Plan: General   Post-op Pain Management:    Induction: Intravenous  Airway Management Planned: Oral ETT  Additional Equipment:   Intra-op Plan:   Post-operative Plan: Extubation in OR  Informed Consent: I have reviewed the patients History and Physical, chart, labs and discussed the procedure including the risks, benefits and alternatives for the proposed anesthesia with the patient or authorized representative who has indicated his/her understanding and acceptance.     Plan Discussed with: CRNA, Anesthesiologist and Surgeon  Anesthesia Plan Comments:         Anesthesia Quick Evaluation

## 2012-04-21 NOTE — Progress Notes (Signed)
Attempted to call report x2 to [facility], nurse was busy both times.  Left name and number for nurse to return call.  [facility] here to take patient to [facility] now.  Schonewitz, Candelaria Stagers 04/21/2012

## 2012-04-21 NOTE — Progress Notes (Signed)
UR Chart Review Completed  

## 2012-04-21 NOTE — Transfer of Care (Signed)
Immediate Anesthesia Transfer of Care Note  Patient: Shawn Velazquez  Procedure(s) Performed: Procedure(s) (LRB) with comments: POSTERIOR CERVICAL LAMINECTOMY (Left) - Posterior Cervical laminectomy/foraminotomy and diskectomy, left cervical seven-thoracic one  Patient Location: PACU  Anesthesia Type: General  Level of Consciousness: awake, alert  and oriented  Airway & Oxygen Therapy: Patient Spontanous Breathing  Post-op Assessment: Report given to PACU RN and Post -op Vital signs reviewed and stable  Post vital signs: Reviewed and stable  Complications: No apparent anesthesia complications

## 2012-04-21 NOTE — H&P (Signed)
Shawn Velazquez is an 46 y.o. male.   Chief Complaint: Neck pain left arm pain weakness HPI: Patient is a 46 year old gentleman has a week of left chest wall pain and radiation with pain and numbness in the surface of his left arm that lasted fingers of his left hand he has noticed weakness in the left and patient was emerged from was admitted and was worked up for chest pain which was negative ultimately C-spine MRI was obtained which showed some solidus is a large disc herniation C7-T1 displaced left C8 nerve root side this didn't correlate with his clinical syndrome is a patient was transferred from any [facility] the [facility] for my evaluation. Currently patient denies any right arm symptoms that is a little bit of chronic back pain but otherwise been a systematic for that he arrived evidence of cervical recovery prior to week ago when this began his left arm. He denies any pain into his thumb or forefinger denies any right arm symptoms denies any difficulty walking or bowel bladder complains.  Past Medical History  Diagnosis Date  . Diabetes mellitus     diet controlled    Past Surgical History  Procedure Date  . Rotator cuff repair     Family History  Problem Relation Age of Onset  . Diabetes Father    Social History:  reports that he has been smoking Cigarettes.  He has been smoking about .5 packs per day. He does not have any smokeless tobacco history on file. He reports that he does not drink alcohol or use illicit drugs.  Allergies:  Allergies  Allergen Reactions  . Neosporin + Pain Relief Max St (Neomy-Bacit-Polymyx-Pramoxine) Rash    Medications Prior to Admission  Medication Sig Dispense Refill  . citalopram (CELEXA) 20 MG tablet Take 20 mg by mouth daily.        . hydrochlorothiazide (HYDRODIURIL) 25 MG tablet Take 25 mg by mouth every evening.      Marland Kitchen HYDROcodone-acetaminophen (LORTAB) 10-500 MG per tablet Take 1 tablet by mouth every 6 (six) hours as needed. For pain        . ibuprofen (ADVIL,MOTRIN) 800 MG tablet Take 800 mg by mouth every 8 (eight) hours as needed. For pain      . LORazepam (ATIVAN) 2 MG tablet Take 2 mg by mouth every 6 (six) hours as needed. For anxiety      . losartan (COZAAR) 50 MG tablet Take 50 mg by mouth daily.      . metFORMIN (GLUCOPHAGE) 500 MG tablet Take 500 mg by mouth daily.      . Multiple Vitamin (MULTIVITAMIN WITH MINERALS) TABS Take 1 tablet by mouth daily.      Marland Kitchen oxyCODONE-acetaminophen (PERCOCET) 7.5-325 MG per tablet Take 1 tablet by mouth every 4 (four) hours as needed. For pain        Results for orders placed during the hospital encounter of 04/19/12 (from the past 48 hour(s))  CBC WITH DIFFERENTIAL     Status: Abnormal   Collection Time   04/19/12  5:02 PM      Component Value Range Comment   WBC 5.5  4.0 - 10.5 K/uL    RBC 4.33  4.22 - 5.81 MIL/uL    Hemoglobin 14.0  13.0 - 17.0 g/dL    HCT 16.1  09.6 - 04.5 %    MCV 92.8  78.0 - 100.0 fL    MCH 32.3  26.0 - 34.0 pg    MCHC 34.8  30.0 - 36.0 g/dL    RDW 16.1  09.6 - 04.5 %    Platelets 212  150 - 400 K/uL    Neutrophils Relative 49  43 - 77 %    Neutro Abs 2.7  1.7 - 7.7 K/uL    Lymphocytes Relative 35  12 - 46 %    Lymphs Abs 1.9  0.7 - 4.0 K/uL    Monocytes Relative 9  3 - 12 %    Monocytes Absolute 0.5  0.1 - 1.0 K/uL    Eosinophils Relative 6 (*) 0 - 5 %    Eosinophils Absolute 0.3  0.0 - 0.7 K/uL    Basophils Relative 1  0 - 1 %    Basophils Absolute 0.0  0.0 - 0.1 K/uL   BASIC METABOLIC PANEL     Status: Abnormal   Collection Time   04/19/12  5:02 PM      Component Value Range Comment   Sodium 132 (*) 135 - 145 mEq/L    Potassium 3.4 (*) 3.5 - 5.1 mEq/L    Chloride 96  96 - 112 mEq/L    CO2 26  19 - 32 mEq/L    Glucose, Bld 125 (*) 70 - 99 mg/dL    BUN 10  6 - 23 mg/dL    Creatinine, Ser 4.09  0.50 - 1.35 mg/dL    Calcium 81.1  8.4 - 10.5 mg/dL    GFR calc non Af Amer >90  >90 mL/min    GFR calc Af Amer >90  >90 mL/min   TROPONIN I      Status: Normal   Collection Time   04/19/12  5:02 PM      Component Value Range Comment   Troponin I <0.30  <0.30 ng/mL   D-DIMER, QUANTITATIVE     Status: Normal   Collection Time   04/19/12  9:00 PM      Component Value Range Comment   D-Dimer, Quant <0.27  0.00 - 0.48 ug/mL-FEU   HEMOGLOBIN A1C     Status: Abnormal   Collection Time   04/19/12  9:00 PM      Component Value Range Comment   Hemoglobin A1C 7.1 (*) <5.7 %    Mean Plasma Glucose 157 (*) <117 mg/dL   TROPONIN I     Status: Normal   Collection Time   04/19/12  9:15 PM      Component Value Range Comment   Troponin I <0.30  <0.30 ng/mL   GLUCOSE, CAPILLARY     Status: Abnormal   Collection Time   04/19/12  9:41 PM      Component Value Range Comment   Glucose-Capillary 100 (*) 70 - 99 mg/dL    Comment 1 Notify RN      Comment 2 Documented in Chart     TROPONIN I     Status: Normal   Collection Time   04/20/12  3:05 AM      Component Value Range Comment   Troponin I <0.30  <0.30 ng/mL   BASIC METABOLIC PANEL     Status: Abnormal   Collection Time   04/20/12  3:06 AM      Component Value Range Comment   Sodium 135  135 - 145 mEq/L    Potassium 3.4 (*) 3.5 - 5.1 mEq/L    Chloride 98  96 - 112 mEq/L    CO2 28  19 - 32 mEq/L    Glucose, Bld 118 (*) 70 -  99 mg/dL    BUN 9  6 - 23 mg/dL    Creatinine, Ser 0.34  0.50 - 1.35 mg/dL    Calcium 9.2  8.4 - 74.2 mg/dL    GFR calc non Af Amer >90  >90 mL/min    GFR calc Af Amer >90  >90 mL/min   GLUCOSE, CAPILLARY     Status: Abnormal   Collection Time   04/20/12  7:39 AM      Component Value Range Comment   Glucose-Capillary 134 (*) 70 - 99 mg/dL    Comment 1 Notify RN     TROPONIN I     Status: Normal   Collection Time   04/20/12  8:31 AM      Component Value Range Comment   Troponin I <0.30  <0.30 ng/mL   GLUCOSE, CAPILLARY     Status: Abnormal   Collection Time   04/20/12 11:34 AM      Component Value Range Comment   Glucose-Capillary 109 (*) 70 - 99 mg/dL     Comment 1 Notify RN     GLUCOSE, CAPILLARY     Status: Abnormal   Collection Time   04/20/12  4:44 PM      Component Value Range Comment   Glucose-Capillary 194 (*) 70 - 99 mg/dL    Comment 1 Notify RN      Comment 2 Documented in Chart     GLUCOSE, CAPILLARY     Status: Abnormal   Collection Time   04/20/12  7:41 PM      Component Value Range Comment   Glucose-Capillary 132 (*) 70 - 99 mg/dL   GLUCOSE, CAPILLARY     Status: Normal   Collection Time   04/20/12  9:05 PM      Component Value Range Comment   Glucose-Capillary 85  70 - 99 mg/dL   GLUCOSE, CAPILLARY     Status: Abnormal   Collection Time   04/21/12  7:55 AM      Component Value Range Comment   Glucose-Capillary 157 (*) 70 - 99 mg/dL    Dg Cervical Spine Complete  04/20/2012  *RADIOLOGY REPORT*  Clinical Data: Back and shoulder pain.  No injury.  CERVICAL SPINE - COMPLETE 4+ VIEW  Comparison: None.  Findings: The lateral view images through the bottom of C6. Prevertebral soft tissues are within normal limits.  Mild loss of intervertebral disc height and osteophyte formation at C6-C7 and less so C5-C6.  Apparent mild right-sided neural foraminal narrowing at C3-C4 through C5-C6.  There is also apparent multilevel left-sided neural foraminal narrowing which could be secondary to suboptimal patient positioning on the oblique view.  Lateral masses symmetric.  Odontoid process and body of C2 intact.  Motion degraded swimmer's view.  Cervicothoracic junction grossly unremarkable.  IMPRESSION: Lower cervical spondylosis, without acute finding.   Original Report Authenticated By: Consuello Bossier, M.D.    Dg Thoracic Spine W/swimmers  04/20/2012  *RADIOLOGY REPORT*  Clinical Data: Pain without trauma.  THORACIC SPINE - 2 VIEW + SWIMMERS  Comparison: None.  Findings: The AP view demonstrates minimal S-shaped thoracic spine curvature.  Normal paraspinous contours.  The lateral view images from approximately the bottom of T2 through  the mid L1 level .  Maintenance of vertebral body height alignment across these levels.  Intervertebral disc heights are maintained.  IMPRESSION: No acute osseous abnormality.   Original Report Authenticated By: Consuello Bossier, M.D.    Mr Cervical Spine Wo Contrast  04/21/2012  *RADIOLOGY REPORT*  Clinical Data: Neck pain with cervical radiculopathy.  Left arm weakness and numbness.  MRI CERVICAL SPINE WITHOUT CONTRAST  Technique:  Multiplanar and multiecho pulse sequences of the cervical spine, to include the craniocervical junction and cervicothoracic junction, were obtained according to standard protocol without intravenous contrast.  Comparison: Cervical radiographs 04/20/2012  Findings: Image quality degraded by patient motion.  Normal alignment.  Negative for fracture or mass lesion.  Spinal cord evaluation is limited by motion however no cord edema is identified.  C2-3:  Mild disc degeneration with mild spondylosis.  No significant stenosis.  C3-4:  Disc degeneration and spondylosis.  Left-sided uncinate spurs causing left foraminal encroachment and mild spinal stenosis. There is facet hypertrophy bilaterally.  C4-5:  Mild disc degeneration and mild facet degeneration.  C5-6:  Disc degeneration and spondylosis with cord flattening and mild to moderate spinal stenosis.  Mild foraminal narrowing bilaterally due to spurring.  C6-7: Disc degeneration and spondylosis with flattening of the cord and mild spinal stenosis.  C7-T1:   Moderately large left-sided disc protrusion extending into the foramen and compressing the left C8 nerve root.  There is mass effect on the cord and left foraminal encroachment.  This may be related to the patient's left arm weakness and numbness.  IMPRESSION: Moderately large left-sided disc protrusion  C7-T1 with compression of the left C8 nerve root in the foramen.  Disc degeneration and spondylosis with mild to moderate spinal stenosis at C5-6 and C6-7.  In addition, there is  significant left foraminal encroachment at C3-4 due to a large osteophyte.   Original Report Authenticated By: Camelia Phenes, M.D.    Dg Chest Portable 1 View  04/19/2012  *RADIOLOGY REPORT*  Clinical Data: Chest pain.  PORTABLE CHEST - 1 VIEW  Comparison: 03/06/2011.  Findings: The cardiac silhouette, mediastinal and hilar contours are within normal limits and stable.  The lungs are clear.  No pleural effusion.  The bony thorax is intact.  IMPRESSION: Normal chest x-ray.   Original Report Authenticated By: P. Loralie Champagne, M.D.     Review of Systems  Constitutional: Negative.   HENT: Positive for neck pain.   Eyes: Negative.   Respiratory: Negative.   Cardiovascular: Negative.   Gastrointestinal: Negative.   Genitourinary: Negative.   Musculoskeletal: Positive for myalgias and joint pain.  Skin: Negative.   Neurological: Positive for tingling and tremors.  Psychiatric/Behavioral: Negative.     Blood pressure 121/72, pulse 70, temperature 98.3 F (36.8 C), temperature source Oral, resp. rate 18, height 5\' 7"  (1.702 m), weight 91.4 kg (201 lb 8 oz), SpO2 96.00%. Physical Exam  Constitutional: He is oriented to person, place, and time. He appears well-developed.  HENT:  Head: Normocephalic.  Eyes: Pupils are equal, round, and reactive to light.  Neck: Normal range of motion.  Respiratory: Effort normal.  GI: Soft.  Neurological: He is alert and oriented to person, place, and time. He has normal strength. GCS eye subscore is 4. GCS verbal subscore is 5. GCS motor subscore is 6.  Reflex Scores:      Patellar reflexes are 0 on the right side and 0 on the left side.      Achilles reflexes are 0 on the right side and 0 on the left side.      Neck has full range of motion although it does exacerbate some pain strength is 505 and his deltoid bilaterally bicep and tricep bilaterally wrist flexion extension and intrinsics in the right  hand is 5 out of 5 in the left hand he is 4 weakness in  his hand intrinsics grip strength and posterior interossei     Assessment/Plan 4707 presents with left arm weakness and weakness consistent with a C8 radiculopathy MRI scan showing large dyscrasia C7-T1 Daine Gip is reviewed the results of the MRI with the patient and recommended a posterior cervical laminectomy foraminotomy discectomy at C7-T1 level the respiratory breast as well as appear of course expected outcome alternatives of surgery he understands and agrees to proceed foreward.  Shalia Bartko P 04/21/2012, 12:56 PM

## 2012-04-21 NOTE — Op Note (Signed)
Preoperative diagnosis: Left C8 radicular radiculopathy from large ruptured disc C7-T1  Postoperative diagnosis: Same  Procedure: Posterior cervical laminectomy foraminotomy and discectomy at C7-T1 on the left with microscopic discectomy microscopic dissection of the left C8 nerve root  Surgeon: Jillyn Hidden Teya Otterson  Anesthesia: Gen.  EBL: Minimal  History of present illness: Patient is a very pleasant 46 year old gentleman who presented [facility] pain numbness tingling and weakness workup revealed large disc herniation at C7-T1 displaced the left C8 nerve root and due to patient's weakness on clinical exam progression clinical syndrome failed conservative treatment he was recommended posterior cervical laminectomy foraminotomy discectomy at C7-T1 the wrist nevus of the operation as well as perioperative course and expectations of outcome alternatives of surgery were all spine the patient he understood and agreed to proceed forward.  Operative procedure: Patient brought into the or was induced under general anesthesia positioned prone on in pins the neck in neutral position the back side of his neck was prepped and draped in routine sterile fashion the spinous processes of C7 and T1 were identified and after infiltration 10 cc lidocaine with epi a midline incision was made and Bovie light cautery was used to take down the subcutaneous tissues and subperiosteal dissections care on the lamina of C7 and T1. Interoperative x-ray confirmed identification of the C5-6 disc space which was due to space above what I thought was C7-T1 to confirm that we are at the appropriate level. Then using a high-speed drill the medial border pedicle T1 was drilled down as well as into the foramen using a 33mmand1mm to the Kerrison punch laminotomy was begun the C8 proximal nerve root was identified the T1 pedicle was palpated the C8 foramen was unroofed then a with a titanium nerve  hook the C8 nerve root was lifted up off of the pedicle and several very large fragments of disc were teased out from underneath the C8 nerve root. The foramen was then explored with a titanium nerve root as well as a black micro-nerve hook as was a 7 room dissector to confirm no additional fragments the foramen was easily excepting a hook out the C8 foramen I palpated medially inferiorly and superiorly and all this confirm no additional fragments. The C8 nerve root was widely decompressed. The wounds and copious irrigated meticulous hemostasis was maintained a drain was placed and the wounds closed in layers with after Vicryl the skin was closed running 4 septic or benzoin and Steri-Strips applied patient recovered in stable condition. At the end of the case all needle counts and sponge counts were correct.

## 2012-04-21 NOTE — Discharge Summary (Signed)
NAMEPINO, DUECK NO.:  [id number]  MEDICAL RECORD NO.:  [id number]  LOCATION:  [id number]                        FACILITY:  [facility]  PHYSICIAN:  Mila Homer. Sudie Bailey, M.D.DATE OF BIRTH:  05/20/1966  DATE OF ADMISSION:  04/19/2012 DATE OF DISCHARGE:  LH                              DISCHARGE SUMMARY   HISTORY:  A 46 year old who was admitted to the hospital October 16 with chest and shoulder pain which was initially felt to be possible coronary in origin.  He had a 3 day hospitalization extending from October 16 to April 21, 2012.  The morning following admission he noted more pain radiating down his left arm along with numbness in his fingers and lost his strength in the left hand.  The pain originated in the posterior cervical region at that time.  His admission white cell count was normal.  His BMP showed a sodium 132, potassium 3.4.  His glucose was 157 and recheck in the hospital ranged between 85 and 194.  His D-dimer was less than 0.27, A1c 7.1%.  His chest x-ray was felt to be normal.  He had 2 EKGs both of which were normal except for 1 PVC noted on the 2nd one.  His thoracic spine x-ray showed no abnormality extending laterally from T2 through the mid L1 level.  The cervical spine x-ray showed lower cervical spondylosis.  There is mild loss of intervertebral disk height and osteophyte formation at C6-C7 and C5-C6.  The MRI of the cervical spine showed generalized disk degeneration and spondylosis at C5-6 with disk degeneration and spondylosis with cord flattening and mild to moderate spinal stenosis.  At C6-7 there was also flattening of the cord and disk degeneration and mild spinal stenosis. At C7-T1 he had a moderately large left-sided disk protrusion extending into the foramen compressing the left C8 nerve root.  There was mass effect on the cord and left foraminal encroachment.  In the hospital he was on IV fluids and initially clear  liquids.  He was put on Dilaudid IV.  Dilaudid even 4 mg IV every 3 hours did not control his pain.  The 3rd morning, once I had the results of the radiological study, I arranged for transfer to [facility] after talking to Dr. Donalee Citrin, neurosurgery.  FINAL DISCHARGE DIAGNOSES: 1. Left cervical radiculopathy secondary to a C7-T1 disk. 2. Risk factors for coronary artery disease. 3. Tobacco use disorder. 4. Uncontrolled type 2 diabetes. 5. Hyperlipidemia. 6. Obesity. 7. Metabolic syndrome. 8. Hypokalemia and hyponatremia, probably secondary to diuretics. 9.  PLAN:  Again, plan is transfer to [facility] today for neurosurgical evaluation.     Mila Homer. Sudie Bailey, M.D.     SDK/MEDQ  D:  04/21/2012  T:  04/21/2012  Job:  [id number]

## 2012-04-21 NOTE — Progress Notes (Signed)
NAMEJAWAUN, Shawn Velazquez NO.:  [id number]  MEDICAL RECORD NO.:  [id number]  LOCATION:  [id number]                        FACILITY:  [facility]  PHYSICIAN:  Mila Homer. Sudie Bailey, M.D.DATE OF BIRTH:  06-04-1966  DATE OF PROCEDURE: DATE OF DISCHARGE:                                PROGRESS NOTE   SUBJECTIVE:  His pain has been somewhat better but the hydromorphone 4 mg has been a little bit too strong for him.  He went 6 hours last night without pain but then woke up and again had severe pain radiating down the left arm.  He has also had weakness in the muscles of the left hand.  OBJECTIVE:  VITAL SIGNS:  Temperature is 98.3, pulse 75, respiratory rate 18, blood pressure 144/81. GENERAL:  He is sitting up in bed.  His wife is in the room with him. He is in moderate distress.  He is well-developed and obese. HEART:  His heart has a regular rhythm, rate of 70. LUNGS:  His lungs appear clear. MUSCULOSKELETAL:  He has decreased grip strength in left hand.  His thoracic spine x-ray was felt to be normal.  His cervical spine x- ray showed mild loss of intervertebral disk height and osteophyte formation at C6-7 and less at C5-C6 and possible right-sided neural foraminal narrowing at C3-C4 through C5-C6.  There is also apparent multilevel left-sided neural foraminal narrowing but this is felt to be a soft finding because it might have had to do with suboptimal patient positioning with the oblique view.  ASSESSMENT:  Cervical radiculopathy.  PLAN:  I am decreasing the hydromorphone to 3 mg q.3 hours IV.  I have cut his IV back from 75 to 10 mL an hour.  He continues on clear liquids in case he needs surgical intervention later on today.  The MRI results of his cervical spine are not on the system yet and those results will determine how he is treated today.  I discussed all this with the patient and his wife.     Mila Homer. Sudie Bailey, M.D.     SDK/MEDQ  D:  04/21/2012  T:   04/21/2012  Job:  [id number]

## 2012-04-21 NOTE — OR Nursing (Signed)
Dr. Wynetta Emery aware of patient's allergies, and okayed medications to be given.

## 2012-04-22 LAB — GLUCOSE, CAPILLARY
Glucose-Capillary: 135 mg/dL — ABNORMAL HIGH (ref 70–99)
Glucose-Capillary: 129 mg/dL — ABNORMAL HIGH (ref 70–99)
Glucose-Capillary: 165 mg/dL — ABNORMAL HIGH (ref 70–99)

## 2012-04-22 NOTE — Progress Notes (Signed)
Subjective: Patient reports Significant improvement pain his left arm numbness is also improved  Objective: Vital signs in last 24 hours: Temp:  [97.5 F (36.4 C)-98.4 F (36.9 C)] 98.2 F (36.8 C) (10/19 0532) Pulse Rate:  [67-100] 99  (10/19 0532) Resp:  [14-18] 17  (10/19 0532) BP: (110-147)/(60-88) 110/60 mmHg (10/19 0532) SpO2:  [95 %-100 %] 100 % (10/19 0532)  Intake/Output from previous day: 10/18 0701 - 10/19 0700 In: 2038 [Velazquez.O.:838; I.V.:1200] Out: 40 [Blood:40] Intake/Output this shift: Total I/O In: -  Out: 550 [Urine:550]  Strength significantly improved the left arm triceps at 4+ to 5 out of 5 and intrinsics and grip strength are also improved wound is clean and dry  Lab Results:  [facility] 04/19/12 1702  WBC 5.5  HGB 14.0  HCT 40.2  PLT 212   BMET  Basename 04/20/12 0306 04/19/12 1702  NA 135 132*  K 3.4* 3.4*  CL 98 96  CO2 28 26  GLUCOSE 118* 125*  BUN 9 10  CREATININE 0.80 0.84  CALCIUM 9.2 10.0    Studies/Results: Dg Cervical Spine 1 View  04/21/2012  *RADIOLOGY REPORT*  Clinical Data: Posterior cervical laminectomy C7-T1  DG CERVICAL SPINE - 1 VIEW  Comparison: MRI cervical spine dated 04/20/2012  Findings: Single lateral view demonstrates a surgical probe posterior to C5-6.  IMPRESSION: Surgical probe posterior to C5-6.   Original Report Authenticated By: Charline Bills, M.D.    Dg Cervical Spine Complete  04/20/2012  *RADIOLOGY REPORT*  Clinical Data: Back and shoulder pain.  No injury.  CERVICAL SPINE - COMPLETE 4+ VIEW  Comparison: None.  Findings: The lateral view images through the bottom of C6. Prevertebral soft tissues are within normal limits.  Mild loss of intervertebral disc height and osteophyte formation at C6-C7 and less so C5-C6.  Apparent mild right-sided neural foraminal narrowing at C3-C4 through C5-C6.  There is also apparent multilevel left-sided neural foraminal narrowing which could be secondary to suboptimal patient  positioning on the oblique view.  Lateral masses symmetric.  Odontoid process and body of C2 intact.  Motion degraded swimmer's view.  Cervicothoracic junction grossly unremarkable.  IMPRESSION: Lower cervical spondylosis, without acute finding.   Original Report Authenticated By: Consuello Bossier, M.D.    Dg Thoracic Spine W/swimmers  04/20/2012  *RADIOLOGY REPORT*  Clinical Data: Pain without trauma.  THORACIC SPINE - 2 VIEW + SWIMMERS  Comparison: None.  Findings: The AP view demonstrates minimal S-shaped thoracic spine curvature.  Normal paraspinous contours.  The lateral view images from approximately the bottom of T2 through the mid L1 level .  Maintenance of vertebral body height alignment across these levels.  Intervertebral disc heights are maintained.  IMPRESSION: No acute osseous abnormality.   Original Report Authenticated By: Consuello Bossier, M.D.    Mr Cervical Spine Wo Contrast  04/21/2012  *RADIOLOGY REPORT*  Clinical Data: Neck pain with cervical radiculopathy.  Left arm weakness and numbness.  MRI CERVICAL SPINE WITHOUT CONTRAST  Technique:  Multiplanar and multiecho pulse sequences of the cervical spine, to include the craniocervical junction and cervicothoracic junction, were obtained according to standard protocol without intravenous contrast.  Comparison: Cervical radiographs 04/20/2012  Findings: Image quality degraded by patient motion.  Normal alignment.  Negative for fracture or mass lesion.  Spinal cord evaluation is limited by motion however no cord edema is identified.  C2-3:  Mild disc degeneration with mild spondylosis.  No significant stenosis.  C3-4:  Disc degeneration and spondylosis.  Left-sided uncinate  spurs causing left foraminal encroachment and mild spinal stenosis. There is facet hypertrophy bilaterally.  C4-5:  Mild disc degeneration and mild facet degeneration.  C5-6:  Disc degeneration and spondylosis with cord flattening and mild to moderate spinal stenosis.  Mild  foraminal narrowing bilaterally due to spurring.  C6-7: Disc degeneration and spondylosis with flattening of the cord and mild spinal stenosis.  C7-T1:   Moderately large left-sided disc protrusion extending into the foramen and compressing the left C8 nerve root.  There is mass effect on the cord and left foraminal encroachment.  This may be related to the patient's left arm weakness and numbness.  IMPRESSION: Moderately large left-sided disc protrusion  C7-T1 with compression of the left C8 nerve root in the foramen.  Disc degeneration and spondylosis with mild to moderate spinal stenosis at C5-6 and C6-7.  In addition, there is significant left foraminal encroachment at C3-4 due to a large osteophyte.   Original Report Authenticated By: Camelia Phenes, M.D.     Assessment/Plan: Posterior day 1 from posterior cervical foraminotomies discectomies and requiring IV Dilaudid and we'll try to transition to by mouth pain medication day possible discharge tomorrow  LOS: 3 days     Shawn Velazquez 04/22/2012, 9:17 AM

## 2012-04-22 NOTE — Progress Notes (Signed)
RT Note: pt using his own CPAP machine.

## 2012-04-23 LAB — GLUCOSE, CAPILLARY
Glucose-Capillary: 144 mg/dL — ABNORMAL HIGH (ref 70–99)
Glucose-Capillary: 113 mg/dL — ABNORMAL HIGH (ref 70–99)

## 2012-04-23 MED ORDER — CYCLOBENZAPRINE HCL 10 MG PO TABS: 10.0000 mg | ORAL_TABLET | Freq: Three times a day (TID) | ORAL | Status: DC | PRN

## 2012-04-23 MED ORDER — OXYCODONE-ACETAMINOPHEN 5-325 MG PO TABS: 1.0000 | ORAL_TABLET | ORAL | Status: DC | PRN

## 2012-04-23 NOTE — Progress Notes (Signed)
Subjective: Patient reports He still much better no arm pain numbness is improved  Objective: Vital signs in last 24 hours: Temp:  [98.2 F (36.8 C)-98.9 F (37.2 C)] 98.8 F (37.1 C) (10/20 0120) Pulse Rate:  [58-77] 58  (10/20 0700) Resp:  [18-20] 20  (10/20 0700) BP: (115-143)/(65-81) 131/81 mmHg (10/20 0700) SpO2:  [96 %-100 %] 96 % (10/20 0700) Weight:  [90.084 kg (198 lb 9.6 oz)] 90.084 kg (198 lb 9.6 oz) (10/20 0454)  Intake/Output from previous day: 10/19 0701 - 10/20 0700 In: 960 [P.O.:960] Out: 665 [Urine:550; Drains:115] Intake/Output this shift:    Tricep and grip strength significantly improved still some intrinsic weakness wounds dry  Lab Results: No results found for this basename: WBC:2,HGB:2,HCT:2,PLT:2 in the last 72 hours BMET No results found for this basename: NA:2,K:2,CL:2,CO2:2,GLUCOSE:2,BUN:2,CREATININE:2,CALCIUM:2 in the last 72 hours  Studies/Results: Dg Cervical Spine 1 View  04/21/2012  *RADIOLOGY REPORT*  Clinical Data: Posterior cervical laminectomy C7-T1  DG CERVICAL SPINE - 1 VIEW  Comparison: MRI cervical spine dated 04/20/2012  Findings: Single lateral view demonstrates a surgical probe posterior to C5-6.  IMPRESSION: Surgical probe posterior to C5-6.   Original Report Authenticated By: Charline Bills, M.D.     Assessment/Plan: Strength is improved will discharge her scheduled followup in one week  LOS: 4 days     Sarita Hakanson P 04/23/2012, 8:44 AM

## 2012-04-23 NOTE — Progress Notes (Signed)
Pt d/c to home by car with family. Assessment stable. Prescriptions given. Pt verbalizes understanding of d/c instructions. 

## 2012-04-23 NOTE — Discharge Summary (Signed)
  Physician Discharge Summary  Patient ID: Shawn Velazquez MRN: [id number] DOB/AGE: Nov 30, 1965 46 y.o.  Admit date: 04/19/2012 Discharge date: 04/23/2012  Admission Diagnoses:C8 radiculopathy from HNP C7-T1  Discharge Diagnoses: same Active Problems:  * No active hospital problems. *    Discharged Condition: good  Hospital Course: patient is in the hospital and transferred from any pen for evaluation of left arm pain numbness and weakness in his left hand workup revealed a large disc herniation C7-T1 patient was urgently taken the operative room at night underwent a C7-T1 laminectomy foraminotomy and microdiscectomy postoperatively patient did very well recovered in the floor on the floor had significant proven of his left arm pain numbness and tingling and strength in his improvement. Patient convalesced well the next 24 hours weaned off of his IV pain medication was still be discharged home scheduled followup in 41-2 weeks.  Consults: Significant Diagnostic Studies: Treatments:C7-T1 left-sided laminectomy foraminotomies and discectomy Discharge Exam: Blood pressure 131/81, pulse 58, temperature 98.8 F (37.1 C), temperature source Oral, resp. rate 20, height 5\' 7"  (1.702 m), weight 90.084 kg (198 lb 9.6 oz), SpO2 96.00%. Strength 5 out of 5 and with some 4-5 weakness of his left hand intrinsic  Disposition: home     Medication List     As of 04/23/2012  8:47 AM    TAKE these medications         citalopram 20 MG tablet   Commonly known as: CELEXA   Take 20 mg by mouth daily.      cyclobenzaprine 10 MG tablet   Commonly known as: FLEXERIL   Take 1 tablet (10 mg total) by mouth 3 (three) times daily as needed for muscle spasms.      hydrochlorothiazide 25 MG tablet   Commonly known as: HYDRODIURIL   Take 25 mg by mouth every evening.      HYDROcodone-acetaminophen 10-500 MG per tablet   Commonly known as: LORTAB   Take 1 tablet by mouth every 6 (six) hours as  needed. For pain      ibuprofen 800 MG tablet   Commonly known as: ADVIL,MOTRIN   Take 800 mg by mouth every 8 (eight) hours as needed. For pain      LORazepam 2 MG tablet   Commonly known as: ATIVAN   Take 2 mg by mouth every 6 (six) hours as needed. For anxiety      losartan 50 MG tablet   Commonly known as: COZAAR   Take 50 mg by mouth daily.      metFORMIN 500 MG tablet   Commonly known as: GLUCOPHAGE   Take 500 mg by mouth daily.      multivitamin with minerals Tabs   Take 1 tablet by mouth daily.      oxyCODONE-acetaminophen 5-325 MG per tablet   Commonly known as: PERCOCET/ROXICET   Take 1-2 tablets by mouth every 4 (four) hours as needed.      oxyCODONE-acetaminophen 7.5-325 MG per tablet   Commonly known as: PERCOCET   Take 1 tablet by mouth every 4 (four) hours as needed. For pain         Signed: Raeann Offner P 04/23/2012, 8:47 AM

## 2012-04-25 ENCOUNTER — Encounter (HOSPITAL_COMMUNITY): Payer: Self-pay | Admitting: Neurosurgery

## 2012-04-26 ENCOUNTER — Encounter (HOSPITAL_COMMUNITY): Payer: Self-pay

## 2014-02-09 ENCOUNTER — Emergency Department (HOSPITAL_COMMUNITY): Payer: BC Managed Care – PPO

## 2014-02-09 ENCOUNTER — Emergency Department (HOSPITAL_COMMUNITY)
Admission: EM | Admit: 2014-02-09 | Discharge: 2014-02-09 | Disposition: A | Discharge status: Home / Self Care | Payer: BC Managed Care – PPO | Attending: Emergency Medicine | Admitting: Emergency Medicine

## 2014-02-09 ENCOUNTER — Encounter (HOSPITAL_COMMUNITY): Payer: Self-pay | Admitting: Emergency Medicine

## 2014-02-09 DIAGNOSIS — J3489 Other specified disorders of nose and nasal sinuses: Secondary | ICD-10-CM | POA: Insufficient documentation

## 2014-02-09 DIAGNOSIS — X500XXA Overexertion from strenuous movement or load, initial encounter: Secondary | ICD-10-CM | POA: Diagnosis not present

## 2014-02-09 DIAGNOSIS — Y9389 Activity, other specified: Secondary | ICD-10-CM | POA: Insufficient documentation

## 2014-02-09 DIAGNOSIS — R059 Cough, unspecified: Secondary | ICD-10-CM | POA: Diagnosis not present

## 2014-02-09 DIAGNOSIS — M546 Pain in thoracic spine: Secondary | ICD-10-CM | POA: Diagnosis present

## 2014-02-09 DIAGNOSIS — Y929 Unspecified place or not applicable: Secondary | ICD-10-CM | POA: Insufficient documentation

## 2014-02-09 DIAGNOSIS — S29019A Strain of muscle and tendon of unspecified wall of thorax, initial encounter: Secondary | ICD-10-CM

## 2014-02-09 DIAGNOSIS — Z888 Allergy status to other drugs, medicaments and biological substances status: Secondary | ICD-10-CM | POA: Diagnosis not present

## 2014-02-09 DIAGNOSIS — R05 Cough: Secondary | ICD-10-CM | POA: Insufficient documentation

## 2014-02-09 DIAGNOSIS — R0781 Pleurodynia: Secondary | ICD-10-CM

## 2014-02-09 DIAGNOSIS — S239XXA Sprain of unspecified parts of thorax, initial encounter: Secondary | ICD-10-CM | POA: Insufficient documentation

## 2014-02-09 DIAGNOSIS — R079 Chest pain, unspecified: Secondary | ICD-10-CM | POA: Diagnosis not present

## 2014-02-09 DIAGNOSIS — E119 Type 2 diabetes mellitus without complications: Secondary | ICD-10-CM | POA: Diagnosis not present

## 2014-02-09 DIAGNOSIS — F17200 Nicotine dependence, unspecified, uncomplicated: Secondary | ICD-10-CM | POA: Diagnosis not present

## 2014-02-09 DIAGNOSIS — Z79899 Other long term (current) drug therapy: Secondary | ICD-10-CM | POA: Diagnosis not present

## 2014-02-09 HISTORY — DX: Essential (primary) hypertension: I10

## 2014-02-09 MED ORDER — OXYCODONE-ACETAMINOPHEN 5-325 MG PO TABS
1.0000 | ORAL_TABLET | Freq: Once | ORAL | Status: AC
Start: 2014-02-09 — End: 2014-02-09
  Administered 2014-02-09: 1 via ORAL
  Filled 2014-02-09: qty 1

## 2014-02-09 MED ORDER — HYDROCODONE-ACETAMINOPHEN 5-325 MG PO TABS: 1.0000 | ORAL_TABLET | ORAL | Status: DC | PRN

## 2014-02-09 MED ORDER — CYCLOBENZAPRINE HCL 10 MG PO TABS: 10.0000 mg | ORAL_TABLET | Freq: Two times a day (BID) | ORAL | Status: DC | PRN

## 2014-02-09 NOTE — ED Notes (Signed)
Pt reports thoracic back pain x1 week that has progressively worsened over the last week. Pain now radiates to front left side in rib area. Pt reports having cold s/s at the time pain started with a strong cough. Reports he has to hold left side lower ribs when he cough.

## 2014-02-09 NOTE — ED Notes (Signed)
Patient with no complaints at this time. Respirations even and unlabored. Skin warm/dry. Discharge instructions reviewed with patient at this time. Patient given opportunity to voice concerns/ask questions. Patient discharged at this time and left Emergency Department with steady gait.   

## 2014-02-09 NOTE — ED Provider Notes (Signed)
CSN: [id number]     Arrival date & time 02/09/14  1330 History   First MD Initiated Contact with Patient 02/09/14 1402     Chief Complaint  Patient presents with  . Back Pain     (Consider location/radiation/quality/duration/timing/severity/associated sxs/prior Treatment) Patient is a 48 y.o. male presenting with back pain. The history is provided by the patient.  Back Pain Location:  Thoracic spine Quality:  Aching Duration:  1 week Timing:  Constant Progression:  Worsening Chronicity:  New RONNEL ZUERCHER is a 48 y.o. male who presents to the ED with thoracic back pain that started about a week ago. He had a cough and congestion prior to having the pain. He denies shortness of breath, fever or chills, n/v or other problems. He got over the cold for the most part but continue to have the back pain that radiates to the left ribs.   Past Medical History  Diagnosis Date  . Diabetes mellitus     diet controlled  . Hypertension    Past Surgical History  Procedure Laterality Date  . Rotator cuff repair    . Posterior cervical laminectomy  04/21/2012    Procedure: POSTERIOR CERVICAL LAMINECTOMY;  Surgeon: Elaina Hoops, MD;  Location: [facility];  Service: Neurosurgery;  Laterality: Left;  Posterior Cervical laminectomy/foraminotomy and diskectomy, left cervical seven-thoracic one   Family History  Problem Relation Age of Onset  . Diabetes Father    History  Substance Use Topics  . Smoking status: Current Some Day Smoker -- 0.50 packs/day  . Smokeless tobacco: Not on file  . Alcohol Use: No    Review of Systems  HENT: Positive for congestion.   Respiratory: Positive for cough.   Musculoskeletal: Positive for back pain.       Left rib tenderness on palpation.  all other systems negative    Allergies  Neosporin + pain relief max st  Home Medications   Prior to Admission medications   Medication Sig Start Date End Date Taking? Authorizing Provider  citalopram  (CELEXA) 20 MG tablet Take 20 mg by mouth daily.      Historical Provider, MD  cyclobenzaprine (FLEXERIL) 10 MG tablet Take 1 tablet (10 mg total) by mouth 3 (three) times daily as needed for muscle spasms. 04/23/12   Elaina Hoops, MD  hydrochlorothiazide (HYDRODIURIL) 25 MG tablet Take 25 mg by mouth every evening.    Historical Provider, MD  HYDROcodone-acetaminophen (LORTAB) 10-500 MG per tablet Take 1 tablet by mouth every 6 (six) hours as needed. For pain    Historical Provider, MD  ibuprofen (ADVIL,MOTRIN) 800 MG tablet Take 800 mg by mouth every 8 (eight) hours as needed. For pain    Historical Provider, MD  LORazepam (ATIVAN) 2 MG tablet Take 2 mg by mouth every 6 (six) hours as needed. For anxiety    Historical Provider, MD  losartan (COZAAR) 50 MG tablet Take 50 mg by mouth daily.    Historical Provider, MD  metFORMIN (GLUCOPHAGE) 500 MG tablet Take 500 mg by mouth daily.    Historical Provider, MD  Multiple Vitamin (MULTIVITAMIN WITH MINERALS) TABS Take 1 tablet by mouth daily.    Historical Provider, MD  oxyCODONE-acetaminophen (PERCOCET) 7.5-325 MG per tablet Take 1 tablet by mouth every 4 (four) hours as needed. For pain    Historical Provider, MD  oxyCODONE-acetaminophen (PERCOCET/ROXICET) 5-325 MG per tablet Take 1-2 tablets by mouth every 4 (four) hours as needed. 04/23/12   Elaina Hoops,  MD   BP 160/110  Pulse 72  Temp(Src) 99 F (37.2 C) (Oral)  Ht 5\' 6"  (1.676 m)  Wt 200 lb (90.719 kg)  BMI 32.30 kg/m2  SpO2 100% Physical Exam  Nursing note and vitals reviewed. Constitutional: He is oriented to person, place, and time. He appears well-developed and well-nourished. No distress.  HENT:  Head: Normocephalic and atraumatic.  Eyes: EOM are normal. Pupils are equal, round, and reactive to light.  Neck: Normal range of motion. Neck supple.  Cardiovascular: Normal rate and regular rhythm.   Pulmonary/Chest: Effort normal. No respiratory distress.  Abdominal: Soft. Bowel sounds  are normal. There is no tenderness.  Musculoskeletal: Normal range of motion. He exhibits no edema.       Thoracic back: He exhibits tenderness and spasm. He exhibits no deformity and normal pulse.       Back:  Neurological: He is alert and oriented to person, place, and time. He has normal strength. No cranial nerve deficit or sensory deficit. Coordination and gait normal.  Reflex Scores:      Bicep reflexes are 2+ on the right side and 2+ on the left side.      Brachioradialis reflexes are 2+ on the right side and 2+ on the left side.      Patellar reflexes are 2+ on the right side and 2+ on the left side.      Achilles reflexes are 2+ on the right side and 2+ on the left side. Skin: Skin is warm and dry.  Psychiatric: He has a normal mood and affect. His behavior is normal.    ED Course  Procedures X-ray, pain management, ice After Percocet and ice patient states the pain is better Dg Ribs Unilateral W/chest Left  02/09/2014   CLINICAL DATA:  BACK PAIN. Lateral to mid left rib pain. No known injury.  EXAM: LEFT RIBS AND CHEST - 3+ VIEW  COMPARISON:  04/19/2012  FINDINGS: No fracture or other bone lesions are seen involving the ribs. There is no evidence of pneumothorax or pleural effusion. Both lungs are clear. Heart size and mediastinal contours are within normal limits.  IMPRESSION: Negative.   Electronically Signed   By: Shon Hale M.D.   On: 02/09/2014 15:34    MDM  48 y.o. male with left thoracic pain that radiates to the left rib area that started about a week ago. Stable for discharge without neuro deficits. Will treat for pain and muscle spasm. I have reviewed this patient's vital signs, nurses notes, appropriate imaging and discussed findings with the patient and plan of care. He voices understanding and agrees to plan. He will return as needed for any problems.    Medication List    TAKE these medications       cyclobenzaprine 10 MG tablet  Commonly known as:  FLEXERIL    Take 1 tablet (10 mg total) by mouth 2 (two) times daily as needed for muscle spasms.      ASK your doctor about these medications       ALPRAZolam 1 MG tablet  Commonly known as:  XANAX  Take 1 mg by mouth 4 (four) times daily as needed for anxiety.     citalopram 40 MG tablet  Commonly known as:  CELEXA  Take 40 mg by mouth daily.     hydrochlorothiazide 25 MG tablet  Commonly known as:  HYDRODIURIL  Take 25 mg by mouth daily.     HYDROcodone-acetaminophen 5-325 MG per tablet  Commonly known as:  NORCO/VICODIN  Take 1 tablet by mouth every 4 (four) hours as needed.  Ask about: Which instructions should I use?     HYDROcodone-acetaminophen 5-325 MG per tablet  Commonly known as:  NORCO/VICODIN  Take 1-2 tablets by mouth every 6 (six) hours as needed for moderate pain.  Ask about: Which instructions should I use?     ibuprofen 800 MG tablet  Commonly known as:  ADVIL,MOTRIN  Take 800 mg by mouth every 8 (eight) hours as needed for moderate pain. For pain     losartan 50 MG tablet  Commonly known as:  COZAAR  Take 50-100 mg by mouth daily.     metFORMIN 500 MG tablet  Commonly known as:  GLUCOPHAGE  Take 500 mg by mouth 2 (two) times daily with a meal.     multivitamin with minerals Tabs tablet  Take 1 tablet by mouth daily.          Patient to follow up for his elevated BP. BP 168/96  Pulse 72  Temp(Src) 99 F (37.2 C) (Oral)  Ht 5\' 6"  (1.676 m)  Wt 200 lb (90.719 kg)  BMI 32.30 kg/m2  SpO2 100%   [facility], NP 02/09/14 [place], NP 02/09/14 1558

## 2014-02-10 NOTE — ED Provider Notes (Signed)
Medical screening examination/treatment/procedure(s) were performed by non-physician practitioner and as supervising physician I was immediately available for consultation/collaboration.   EKG Interpretation None       Nat Christen, MD 02/10/14 [phone]

## 2014-02-13 ENCOUNTER — Ambulatory Visit (HOSPITAL_COMMUNITY)
Admission: RE | Admit: 2014-02-13 | Discharge: 2014-02-13 | Disposition: A | Discharge status: Home / Self Care | Payer: BC Managed Care – PPO | Source: Ambulatory Visit | Attending: Family Medicine | Admitting: Family Medicine

## 2014-02-13 ENCOUNTER — Other Ambulatory Visit (HOSPITAL_COMMUNITY): Payer: Self-pay | Admitting: Family Medicine

## 2014-02-13 DIAGNOSIS — M546 Pain in thoracic spine: Secondary | ICD-10-CM

## 2014-02-19 ENCOUNTER — Other Ambulatory Visit (HOSPITAL_COMMUNITY): Payer: Self-pay | Admitting: Family Medicine

## 2014-02-19 DIAGNOSIS — R1012 Left upper quadrant pain: Secondary | ICD-10-CM

## 2014-02-20 ENCOUNTER — Other Ambulatory Visit (HOSPITAL_COMMUNITY): Payer: Self-pay | Admitting: Family Medicine

## 2014-02-20 ENCOUNTER — Ambulatory Visit (HOSPITAL_COMMUNITY)
Admission: RE | Admit: 2014-02-20 | Discharge: 2014-02-20 | Disposition: A | Discharge status: Home / Self Care | Payer: BC Managed Care – PPO | Source: Ambulatory Visit | Attending: Family Medicine | Admitting: Family Medicine

## 2014-02-20 DIAGNOSIS — R1012 Left upper quadrant pain: Secondary | ICD-10-CM

## 2014-02-20 DIAGNOSIS — R079 Chest pain, unspecified: Secondary | ICD-10-CM | POA: Diagnosis not present

## 2014-02-20 DIAGNOSIS — R0781 Pleurodynia: Secondary | ICD-10-CM

## 2014-02-21 ENCOUNTER — Ambulatory Visit (HOSPITAL_COMMUNITY)
Admission: RE | Admit: 2014-02-21 | Discharge: 2014-02-21 | Disposition: A | Discharge status: Home / Self Care | Payer: BC Managed Care – PPO | Source: Ambulatory Visit | Attending: Family Medicine | Admitting: Family Medicine

## 2014-02-21 DIAGNOSIS — K7689 Other specified diseases of liver: Secondary | ICD-10-CM | POA: Insufficient documentation

## 2014-02-21 DIAGNOSIS — R1012 Left upper quadrant pain: Secondary | ICD-10-CM | POA: Diagnosis present

## 2014-02-21 MED ORDER — IOHEXOL 300 MG/ML  SOLN
100.0000 mL | Freq: Once | INTRAMUSCULAR | Status: AC | PRN
  Administered 2014-02-21: 100 mL via INTRAVENOUS

## 2014-03-07 ENCOUNTER — Other Ambulatory Visit (HOSPITAL_COMMUNITY): Payer: Self-pay | Admitting: Family Medicine

## 2014-03-07 DIAGNOSIS — R079 Chest pain, unspecified: Secondary | ICD-10-CM

## 2014-03-08 ENCOUNTER — Other Ambulatory Visit (HOSPITAL_COMMUNITY): Payer: Self-pay | Admitting: Internal Medicine

## 2014-03-08 DIAGNOSIS — R079 Chest pain, unspecified: Secondary | ICD-10-CM

## 2014-03-13 ENCOUNTER — Ambulatory Visit (HOSPITAL_COMMUNITY)
Admission: RE | Admit: 2014-03-13 | Discharge: 2014-03-13 | Disposition: A | Discharge status: Home / Self Care | Payer: BC Managed Care – PPO | Source: Ambulatory Visit | Attending: Family Medicine | Admitting: Family Medicine

## 2014-03-13 DIAGNOSIS — K7689 Other specified diseases of liver: Secondary | ICD-10-CM | POA: Diagnosis not present

## 2014-03-13 DIAGNOSIS — R079 Chest pain, unspecified: Secondary | ICD-10-CM

## 2014-03-13 DIAGNOSIS — J438 Other emphysema: Secondary | ICD-10-CM | POA: Diagnosis not present

## 2014-03-13 LAB — POCT I-STAT CREATININE: CREATININE: 0.8 mg/dL (ref 0.50–1.35)

## 2014-03-13 MED ORDER — IOHEXOL 300 MG/ML  SOLN
80.0000 mL | Freq: Once | INTRAMUSCULAR | Status: AC | PRN
  Administered 2014-03-13: 80 mL via INTRAVENOUS

## 2014-03-26 ENCOUNTER — Other Ambulatory Visit: Payer: Self-pay | Admitting: Neurosurgery

## 2014-03-28 ENCOUNTER — Encounter (HOSPITAL_COMMUNITY): Payer: Self-pay

## 2014-03-29 ENCOUNTER — Encounter (HOSPITAL_COMMUNITY)
Admission: RE | Admit: 2014-03-29 | Discharge: 2014-03-29 | Disposition: A | Discharge status: Home / Self Care | Payer: BC Managed Care – PPO | Source: Ambulatory Visit | Attending: Neurosurgery | Admitting: Neurosurgery

## 2014-03-29 ENCOUNTER — Ambulatory Visit (HOSPITAL_COMMUNITY)
Admission: RE | Admit: 2014-03-29 | Discharge: 2014-03-29 | Disposition: A | Discharge status: Home / Self Care | Payer: BC Managed Care – PPO | Source: Ambulatory Visit | Attending: Anesthesiology | Admitting: Anesthesiology

## 2014-03-29 ENCOUNTER — Encounter (HOSPITAL_COMMUNITY): Payer: Self-pay

## 2014-03-29 DIAGNOSIS — Z01812 Encounter for preprocedural laboratory examination: Secondary | ICD-10-CM | POA: Insufficient documentation

## 2014-03-29 DIAGNOSIS — IMO0002 Reserved for concepts with insufficient information to code with codable children: Secondary | ICD-10-CM | POA: Insufficient documentation

## 2014-03-29 DIAGNOSIS — Z01818 Encounter for other preprocedural examination: Secondary | ICD-10-CM | POA: Insufficient documentation

## 2014-03-29 DIAGNOSIS — Z01810 Encounter for preprocedural cardiovascular examination: Secondary | ICD-10-CM | POA: Diagnosis not present

## 2014-03-29 HISTORY — DX: Unspecified glaucoma: H40.9

## 2014-03-29 HISTORY — DX: Sleep apnea, unspecified: G47.30

## 2014-03-29 HISTORY — DX: Psoriasis, unspecified: L40.9

## 2014-03-29 HISTORY — DX: Gastro-esophageal reflux disease without esophagitis: K21.9

## 2014-03-29 HISTORY — DX: Other chronic pain: G89.29

## 2014-03-29 HISTORY — DX: Dermatitis, unspecified: L30.9

## 2014-03-29 HISTORY — DX: Presence of spectacles and contact lenses: Z97.3

## 2014-03-29 HISTORY — DX: Anxiety disorder, unspecified: F41.9

## 2014-03-29 HISTORY — DX: Alcoholic fatty liver: K70.0

## 2014-03-29 HISTORY — DX: Dorsalgia, unspecified: M54.9

## 2014-03-29 LAB — COMPREHENSIVE METABOLIC PANEL
ALT: 78 U/L — ABNORMAL HIGH (ref 0–53)
AST: 48 U/L — ABNORMAL HIGH (ref 0–37)
Albumin: 4.3 g/dL (ref 3.5–5.2)
Alkaline Phosphatase: 67 U/L (ref 39–117)
Anion gap: 18 — ABNORMAL HIGH (ref 5–15)
BILIRUBIN TOTAL: 0.5 mg/dL (ref 0.3–1.2)
BUN: 10 mg/dL (ref 6–23)
CHLORIDE: 96 meq/L (ref 96–112)
CO2: 24 meq/L (ref 19–32)
CREATININE: 0.74 mg/dL (ref 0.50–1.35)
Calcium: 10.1 mg/dL (ref 8.4–10.5)
GLUCOSE: 96 mg/dL (ref 70–99)
Potassium: 4 mEq/L (ref 3.7–5.3)
Sodium: 138 mEq/L (ref 137–147)
Total Protein: 8 g/dL (ref 6.0–8.3)

## 2014-03-29 LAB — SURGICAL PCR SCREEN
MRSA, PCR: NEGATIVE
Staphylococcus aureus: NEGATIVE

## 2014-03-29 LAB — CBC
HCT: 46 % (ref 39.0–52.0)
Hemoglobin: 16.1 g/dL (ref 13.0–17.0)
MCH: 32.9 pg (ref 26.0–34.0)
MCHC: 35 g/dL (ref 30.0–36.0)
MCV: 94.1 fL (ref 78.0–100.0)
PLATELETS: 196 10*3/uL (ref 150–400)
RBC: 4.89 MIL/uL (ref 4.22–5.81)
RDW: 12.4 % (ref 11.5–15.5)
WBC: 9.2 10*3/uL (ref 4.0–10.5)

## 2014-03-29 NOTE — Progress Notes (Signed)
Spoke to [place], [place] for Dr. Saintclair Halsted to make MD aware to release orders.

## 2014-03-29 NOTE — Pre-Procedure Instructions (Addendum)
BRYLER DIBBLE  03/29/2014   Your procedure is scheduled on: Wednesday, April 03, 2014  Report to [facility] Admitting at 10:30 AM.  Call this number if you have problems the morning of surgery: [phone]   Remember:   Do not eat food or drink liquids after midnight Tuesday, April 02, 2014   Take these medicines the morning of surgery with A SIP OF WATER: citalopram (CELEXA),  If needed: ALPRAZolam Duanne Moron) for anxiety,  HYDROcodone- for pain  DO NOT TAKE ANY DIABETIC MEDICATION ON THE DAY OF SURGERY  Stop taking Aspirin, vitamins, and herbal medications. Do not take any NSAIDs ie: Ibuprofen, Advil, Naproxen or any medication containing Aspirin.    Do not wear jewelry, make-up or nail polish.  Do not wear lotions, powders, or perfumes. You may not wear deodorant.  Do not shave 48 hours prior to surgery. Men may shave face and neck.  Do not bring valuables to the hospital.  [facility] is not responsible for any belongings or valuables.               Contacts, dentures or bridgework may not be worn into surgery.  Leave suitcase in the car. After surgery it may be brought to your room.  For patients admitted to the hospital, discharge time is determined by your treatment team.               Patients discharged the day of surgery will not be allowed to drive home.  Name and phone number of your driver:   Special Instructions:  Special Instructions:Special Instructions: [facility] - Preparing for Surgery  Before surgery, you can play an important role.  Because skin is not sterile, your skin needs to be as free of germs as possible.  You can reduce the number of germs on you skin by washing with CHG (chlorahexidine gluconate) soap before surgery.  CHG is an antiseptic cleaner which kills germs and bonds with the skin to continue killing germs even after washing.  Please DO NOT use if you have an allergy to CHG or antibacterial soaps.  If your skin becomes  reddened/irritated stop using the CHG and inform your nurse when you arrive at Short Stay.  Do not shave (including legs and underarms) for at least 48 hours prior to the first CHG shower.  You may shave your face.  Please follow these instructions carefully:   1.  Shower with CHG Soap the night before surgery and the morning of Surgery.  2.  If you choose to wash your hair, wash your hair first as usual with your normal shampoo.  3.  After you shampoo, rinse your hair and body thoroughly to remove the Shampoo.  4.  Use CHG as you would any other liquid soap.  You can apply chg directly  to the skin and wash gently with scrungie or a clean washcloth.  5.  Apply the CHG Soap to your body ONLY FROM THE NECK DOWN.  Do not use on open wounds or open sores.  Avoid contact with your eyes, ears, mouth and genitals (private parts).  Wash genitals (private parts) with your normal soap.  6.  Wash thoroughly, paying special attention to the area where your surgery will be performed.  7.  Thoroughly rinse your body with warm water from the neck down.  8.  DO NOT shower/wash with your normal soap after using and rinsing off the CHG Soap.  9.  Pat yourself dry with  a clean towel.            10.  Wear clean pajamas.            11.  Place clean sheets on your bed the night of your first shower and do not sleep with pets.  Day of Surgery  Do not apply any lotions/deodorants the morning of surgery.  Please wear clean clothes to the hospital/surgery center.   Please read over the following fact sheets that you were given: Pain Booklet, Coughing and Deep Breathing, MRSA Information and Surgical Site Infection Prevention

## 2014-03-29 NOTE — Progress Notes (Signed)
Pt denies SOB, chest pain, and being under the care of a cardiologist. Pt denies having a chest x ray and EKG within the last year. Pt denies having a stress test, echo, and cath.

## 2014-04-03 ENCOUNTER — Ambulatory Visit (HOSPITAL_COMMUNITY)
Admission: RE | Admit: 2014-04-03 | Discharge: 2014-04-04 | Disposition: A | Discharge status: Home / Self Care | Payer: BC Managed Care – PPO | Source: Ambulatory Visit | Attending: Neurosurgery | Admitting: Neurosurgery

## 2014-04-03 ENCOUNTER — Encounter (HOSPITAL_COMMUNITY)
Admission: RE | Disposition: A | Discharge status: Home / Self Care | Payer: Self-pay | Source: Ambulatory Visit | Attending: Neurosurgery

## 2014-04-03 ENCOUNTER — Ambulatory Visit (HOSPITAL_COMMUNITY): Payer: BC Managed Care – PPO | Admitting: Anesthesiology

## 2014-04-03 ENCOUNTER — Ambulatory Visit (HOSPITAL_COMMUNITY): Payer: BC Managed Care – PPO

## 2014-04-03 ENCOUNTER — Encounter (HOSPITAL_COMMUNITY): Payer: Self-pay | Admitting: *Deleted

## 2014-04-03 ENCOUNTER — Encounter (HOSPITAL_COMMUNITY): Payer: BC Managed Care – PPO | Admitting: Anesthesiology

## 2014-04-03 DIAGNOSIS — K219 Gastro-esophageal reflux disease without esophagitis: Secondary | ICD-10-CM | POA: Diagnosis not present

## 2014-04-03 DIAGNOSIS — E118 Type 2 diabetes mellitus with unspecified complications: Secondary | ICD-10-CM | POA: Insufficient documentation

## 2014-04-03 DIAGNOSIS — M5124 Other intervertebral disc displacement, thoracic region: Secondary | ICD-10-CM | POA: Diagnosis not present

## 2014-04-03 DIAGNOSIS — I10 Essential (primary) hypertension: Secondary | ICD-10-CM | POA: Diagnosis not present

## 2014-04-03 DIAGNOSIS — G4730 Sleep apnea, unspecified: Secondary | ICD-10-CM | POA: Diagnosis not present

## 2014-04-03 DIAGNOSIS — M4804 Spinal stenosis, thoracic region: Secondary | ICD-10-CM | POA: Diagnosis not present

## 2014-04-03 DIAGNOSIS — F411 Generalized anxiety disorder: Secondary | ICD-10-CM | POA: Diagnosis not present

## 2014-04-03 DIAGNOSIS — R339 Retention of urine, unspecified: Secondary | ICD-10-CM | POA: Diagnosis not present

## 2014-04-03 DIAGNOSIS — F419 Anxiety disorder, unspecified: Secondary | ICD-10-CM | POA: Insufficient documentation

## 2014-04-03 DIAGNOSIS — F17200 Nicotine dependence, unspecified, uncomplicated: Secondary | ICD-10-CM | POA: Diagnosis not present

## 2014-04-03 DIAGNOSIS — E119 Type 2 diabetes mellitus without complications: Secondary | ICD-10-CM | POA: Diagnosis not present

## 2014-04-03 DIAGNOSIS — Z720 Tobacco use: Secondary | ICD-10-CM | POA: Insufficient documentation

## 2014-04-03 HISTORY — PX: THORACIC DISCECTOMY: SHX6113

## 2014-04-03 LAB — GLUCOSE, CAPILLARY
GLUCOSE-CAPILLARY: 140 mg/dL — AB (ref 70–99)
GLUCOSE-CAPILLARY: 194 mg/dL — AB (ref 70–99)
Glucose-Capillary: 225 mg/dL — ABNORMAL HIGH (ref 70–99)

## 2014-04-03 SURGERY — THORACIC DISCECTOMY
Anesthesia: General | Site: Back | Laterality: Left

## 2014-04-03 MED ORDER — HEMOSTATIC AGENTS (NO CHARGE) OPTIME
TOPICAL | Status: DC | PRN
  Administered 2014-04-03: 1 via TOPICAL

## 2014-04-03 MED ORDER — LIDOCAINE-EPINEPHRINE 1 %-1:100000 IJ SOLN
INTRAMUSCULAR | Status: DC | PRN
  Administered 2014-04-03: 5 mL

## 2014-04-03 MED ORDER — HYDROMORPHONE HCL 1 MG/ML IJ SOLN
0.2500 mg | INTRAMUSCULAR | Status: DC | PRN
  Administered 2014-04-03 (×4): 0.5 mg via INTRAVENOUS

## 2014-04-03 MED ORDER — ARTIFICIAL TEARS OP OINT
TOPICAL_OINTMENT | OPHTHALMIC | Status: DC | PRN
  Administered 2014-04-03: 1 via OPHTHALMIC

## 2014-04-03 MED ORDER — HYDROMORPHONE HCL 1 MG/ML IJ SOLN
INTRAMUSCULAR | Status: AC
  Filled 2014-04-03: qty 1

## 2014-04-03 MED ORDER — OXYCODONE HCL 5 MG PO TABS
5.0000 mg | ORAL_TABLET | Freq: Once | ORAL | Status: AC | PRN
  Administered 2014-04-03: 5 mg via ORAL

## 2014-04-03 MED ORDER — CYCLOBENZAPRINE HCL 10 MG PO TABS
ORAL_TABLET | ORAL | Status: AC
  Filled 2014-04-03: qty 1

## 2014-04-03 MED ORDER — PHENOL 1.4 % MT LIQD: 1.0000 | OROMUCOSAL | Status: DC | PRN

## 2014-04-03 MED ORDER — LACTATED RINGERS IV SOLN
INTRAVENOUS | Status: DC
  Administered 2014-04-03: 10:00:00 via INTRAVENOUS

## 2014-04-03 MED ORDER — ACETAMINOPHEN 650 MG RE SUPP: 650.0000 mg | RECTAL | Status: DC | PRN

## 2014-04-03 MED ORDER — BUPIVACAINE HCL (PF) 0.25 % IJ SOLN
INTRAMUSCULAR | Status: DC | PRN
  Administered 2014-04-03: 10 mL

## 2014-04-03 MED ORDER — DEXAMETHASONE SODIUM PHOSPHATE 4 MG/ML IJ SOLN
INTRAMUSCULAR | Status: AC
  Filled 2014-04-03: qty 2

## 2014-04-03 MED ORDER — PHENYLEPHRINE HCL 10 MG/ML IJ SOLN
20.0000 mg | INTRAMUSCULAR | Status: DC | PRN
  Administered 2014-04-03: 50 ug via INTRAVENOUS

## 2014-04-03 MED ORDER — FENTANYL CITRATE 0.05 MG/ML IJ SOLN
INTRAMUSCULAR | Status: AC
  Filled 2014-04-03: qty 5

## 2014-04-03 MED ORDER — CEFAZOLIN SODIUM 1-5 GM-% IV SOLN
1.0000 g | Freq: Three times a day (TID) | INTRAVENOUS | Status: AC
  Administered 2014-04-03 – 2014-04-04 (×2): 1 g via INTRAVENOUS
  Filled 2014-04-03 (×2): qty 50

## 2014-04-03 MED ORDER — CYCLOBENZAPRINE HCL 10 MG PO TABS
10.0000 mg | ORAL_TABLET | Freq: Three times a day (TID) | ORAL | Status: DC | PRN
  Administered 2014-04-03: 10 mg via ORAL
  Filled 2014-04-03: qty 1

## 2014-04-03 MED ORDER — OXYCODONE HCL 5 MG PO TABS
ORAL_TABLET | ORAL | Status: AC
  Filled 2014-04-03: qty 1

## 2014-04-03 MED ORDER — THROMBIN 5000 UNITS EX SOLR
CUTANEOUS | Status: DC | PRN
  Administered 2014-04-03 (×2): 5000 [IU] via TOPICAL

## 2014-04-03 MED ORDER — PROPOFOL 10 MG/ML IV BOLUS
INTRAVENOUS | Status: AC
  Filled 2014-04-03: qty 20

## 2014-04-03 MED ORDER — MIDAZOLAM HCL 2 MG/2ML IJ SOLN
INTRAMUSCULAR | Status: AC
  Filled 2014-04-03: qty 2

## 2014-04-03 MED ORDER — ROCURONIUM BROMIDE 50 MG/5ML IV SOLN
INTRAVENOUS | Status: AC
  Filled 2014-04-03: qty 1

## 2014-04-03 MED ORDER — LACTATED RINGERS IV SOLN
INTRAVENOUS | Status: DC | PRN
  Administered 2014-04-03 (×2): via INTRAVENOUS

## 2014-04-03 MED ORDER — ONDANSETRON HCL 4 MG/2ML IJ SOLN
INTRAMUSCULAR | Status: AC
  Filled 2014-04-03: qty 2

## 2014-04-03 MED ORDER — NEOSTIGMINE METHYLSULFATE 10 MG/10ML IV SOLN
INTRAVENOUS | Status: AC
  Filled 2014-04-03: qty 1

## 2014-04-03 MED ORDER — DIPHENHYDRAMINE HCL 50 MG/ML IJ SOLN
INTRAMUSCULAR | Status: DC | PRN
  Administered 2014-04-03: 12.5 mg via INTRAVENOUS

## 2014-04-03 MED ORDER — SODIUM CHLORIDE 0.9 % IJ SOLN
3.0000 mL | Freq: Two times a day (BID) | INTRAMUSCULAR | Status: DC
  Administered 2014-04-03: 3 mL via INTRAVENOUS

## 2014-04-03 MED ORDER — IBUPROFEN 800 MG PO TABS
800.0000 mg | ORAL_TABLET | Freq: Three times a day (TID) | ORAL | Status: DC | PRN
  Filled 2014-04-03: qty 1

## 2014-04-03 MED ORDER — 0.9 % SODIUM CHLORIDE (POUR BTL) OPTIME
TOPICAL | Status: DC | PRN
  Administered 2014-04-03: 1000 mL

## 2014-04-03 MED ORDER — ACETAMINOPHEN 325 MG PO TABS: 650.0000 mg | ORAL_TABLET | ORAL | Status: DC | PRN

## 2014-04-03 MED ORDER — HYDROCODONE-ACETAMINOPHEN 5-325 MG PO TABS
1.0000 | ORAL_TABLET | Freq: Four times a day (QID) | ORAL | Status: DC | PRN
  Administered 2014-04-03: 2 via ORAL
  Filled 2014-04-03: qty 2

## 2014-04-03 MED ORDER — CITALOPRAM HYDROBROMIDE 40 MG PO TABS
40.0000 mg | ORAL_TABLET | Freq: Every day | ORAL | Status: DC
  Administered 2014-04-04: 40 mg via ORAL
  Filled 2014-04-03: qty 1

## 2014-04-03 MED ORDER — LOSARTAN POTASSIUM 50 MG PO TABS
50.0000 mg | ORAL_TABLET | Freq: Every day | ORAL | Status: DC | PRN
  Filled 2014-04-03: qty 2

## 2014-04-03 MED ORDER — MENTHOL 3 MG MT LOZG: 1.0000 | LOZENGE | OROMUCOSAL | Status: DC | PRN

## 2014-04-03 MED ORDER — HYDROCHLOROTHIAZIDE 25 MG PO TABS
25.0000 mg | ORAL_TABLET | Freq: Every day | ORAL | Status: DC
  Administered 2014-04-04: 25 mg via ORAL
  Filled 2014-04-03: qty 1

## 2014-04-03 MED ORDER — LIDOCAINE HCL (CARDIAC) 20 MG/ML IV SOLN
INTRAVENOUS | Status: AC
  Filled 2014-04-03: qty 5

## 2014-04-03 MED ORDER — MIDAZOLAM HCL 5 MG/5ML IJ SOLN
INTRAMUSCULAR | Status: DC | PRN
  Administered 2014-04-03: 2 mg via INTRAVENOUS

## 2014-04-03 MED ORDER — SODIUM CHLORIDE 0.9 % IV SOLN: 250.0000 mL | INTRAVENOUS | Status: DC

## 2014-04-03 MED ORDER — CEFAZOLIN SODIUM-DEXTROSE 2-3 GM-% IV SOLR
INTRAVENOUS | Status: DC | PRN
  Administered 2014-04-03: 2 g via INTRAVENOUS

## 2014-04-03 MED ORDER — METFORMIN HCL 500 MG PO TABS
500.0000 mg | ORAL_TABLET | Freq: Every day | ORAL | Status: DC
  Administered 2014-04-04: 500 mg via ORAL
  Filled 2014-04-03 (×2): qty 1

## 2014-04-03 MED ORDER — PROPOFOL 10 MG/ML IV BOLUS
INTRAVENOUS | Status: DC | PRN
  Administered 2014-04-03: 200 mg via INTRAVENOUS

## 2014-04-03 MED ORDER — PHENYLEPHRINE 40 MCG/ML (10ML) SYRINGE FOR IV PUSH (FOR BLOOD PRESSURE SUPPORT)
PREFILLED_SYRINGE | INTRAVENOUS | Status: AC
  Filled 2014-04-03: qty 10

## 2014-04-03 MED ORDER — SODIUM CHLORIDE 0.9 % IJ SOLN: 3.0000 mL | INTRAMUSCULAR | Status: DC | PRN

## 2014-04-03 MED ORDER — EPHEDRINE SULFATE 50 MG/ML IJ SOLN
INTRAMUSCULAR | Status: DC | PRN
  Administered 2014-04-03 (×2): 10 mg via INTRAVENOUS

## 2014-04-03 MED ORDER — PHENYLEPHRINE HCL 10 MG/ML IJ SOLN
INTRAMUSCULAR | Status: DC | PRN
  Administered 2014-04-03 (×2): 80 ug via INTRAVENOUS
  Administered 2014-04-03: 160 ug via INTRAVENOUS

## 2014-04-03 MED ORDER — LIDOCAINE HCL (CARDIAC) 20 MG/ML IV SOLN
INTRAVENOUS | Status: DC | PRN
  Administered 2014-04-03: 40 mg via INTRAVENOUS

## 2014-04-03 MED ORDER — PHENYLEPHRINE HCL 10 MG/ML IJ SOLN
INTRAMUSCULAR | Status: AC
  Filled 2014-04-03: qty 2

## 2014-04-03 MED ORDER — ALPRAZOLAM 0.5 MG PO TABS: 1.0000 mg | ORAL_TABLET | Freq: Four times a day (QID) | ORAL | Status: DC | PRN

## 2014-04-03 MED ORDER — ADULT MULTIVITAMIN W/MINERALS CH
1.0000 | ORAL_TABLET | Freq: Every day | ORAL | Status: DC
  Administered 2014-04-04: 1 via ORAL
  Filled 2014-04-03: qty 1

## 2014-04-03 MED ORDER — ROCURONIUM BROMIDE 100 MG/10ML IV SOLN
INTRAVENOUS | Status: DC | PRN
  Administered 2014-04-03: 20 mg via INTRAVENOUS
  Administered 2014-04-03: 50 mg via INTRAVENOUS

## 2014-04-03 MED ORDER — ALUM & MAG HYDROXIDE-SIMETH 200-200-20 MG/5ML PO SUSP: 30.0000 mL | Freq: Four times a day (QID) | ORAL | Status: DC | PRN

## 2014-04-03 MED ORDER — HYDROMORPHONE HCL 1 MG/ML IJ SOLN
0.5000 mg | INTRAMUSCULAR | Status: DC | PRN
  Administered 2014-04-03 – 2014-04-04 (×4): 1 mg via INTRAVENOUS
  Filled 2014-04-03 (×4): qty 1

## 2014-04-03 MED ORDER — OXYCODONE-ACETAMINOPHEN 5-325 MG PO TABS
1.0000 | ORAL_TABLET | ORAL | Status: DC | PRN
  Administered 2014-04-03 – 2014-04-04 (×2): 2 via ORAL
  Filled 2014-04-03 (×2): qty 2

## 2014-04-03 MED ORDER — CEFAZOLIN SODIUM-DEXTROSE 2-3 GM-% IV SOLR
INTRAVENOUS | Status: AC
  Filled 2014-04-03: qty 50

## 2014-04-03 MED ORDER — NEOSTIGMINE METHYLSULFATE 10 MG/10ML IV SOLN
INTRAVENOUS | Status: DC | PRN
  Administered 2014-04-03: 3 mg via INTRAVENOUS

## 2014-04-03 MED ORDER — FENTANYL CITRATE 0.05 MG/ML IJ SOLN
INTRAMUSCULAR | Status: DC | PRN
  Administered 2014-04-03: 100 ug via INTRAVENOUS
  Administered 2014-04-03: 150 ug via INTRAVENOUS
  Administered 2014-04-03: 50 ug via INTRAVENOUS
  Administered 2014-04-03: 100 ug via INTRAVENOUS
  Administered 2014-04-03: 50 ug via INTRAVENOUS

## 2014-04-03 MED ORDER — GLYCOPYRROLATE 0.2 MG/ML IJ SOLN
INTRAMUSCULAR | Status: DC | PRN
  Administered 2014-04-03: 0.4 mg via INTRAVENOUS

## 2014-04-03 MED ORDER — OXYCODONE HCL 5 MG/5ML PO SOLN: 5.0000 mg | Freq: Once | ORAL | Status: AC | PRN

## 2014-04-03 MED ORDER — ONDANSETRON HCL 4 MG/2ML IJ SOLN
INTRAMUSCULAR | Status: DC | PRN
  Administered 2014-04-03: 4 mg via INTRAVENOUS

## 2014-04-03 MED ORDER — BACITRACIN 50000 UNITS IM SOLR
INTRAMUSCULAR | Status: DC | PRN
  Administered 2014-04-03: 15:00:00

## 2014-04-03 MED ORDER — ONDANSETRON HCL 4 MG/2ML IJ SOLN: 4.0000 mg | INTRAMUSCULAR | Status: DC | PRN

## 2014-04-03 MED ORDER — GLYCOPYRROLATE 0.2 MG/ML IJ SOLN
INTRAMUSCULAR | Status: AC
  Filled 2014-04-03: qty 2

## 2014-04-03 SURGICAL SUPPLY — 66 items
ADH SKN CLS APL DERMABOND .7 (GAUZE/BANDAGES/DRESSINGS) ×1
ADH SKN CLS LQ APL DERMABOND (GAUZE/BANDAGES/DRESSINGS) ×1
APL SKNCLS STERI-STRIP NONHPOA (GAUZE/BANDAGES/DRESSINGS) ×1
BAG DECANTER FOR FLEXI CONT (MISCELLANEOUS) ×2 IMPLANT
BENZOIN TINCTURE PRP APPL 2/3 (GAUZE/BANDAGES/DRESSINGS) ×2 IMPLANT
BLADE SURG 11 STRL SS (BLADE) ×2 IMPLANT
BLADE SURG ROTATE 9660 (MISCELLANEOUS) IMPLANT
BRUSH SCRUB EZ PLAIN DRY (MISCELLANEOUS) ×2 IMPLANT
BUR MATCHSTICK NEURO 3.0 LAGG (BURR) ×2 IMPLANT
BUR PRECISION FLUTE 6.0 (BURR) ×2 IMPLANT
CANISTER SUCT 3000ML (MISCELLANEOUS) ×2 IMPLANT
CONT SPEC 4OZ CLIKSEAL STRL BL (MISCELLANEOUS) ×2 IMPLANT
DECANTER SPIKE VIAL GLASS SM (MISCELLANEOUS) ×2 IMPLANT
DERMABOND ADHESIVE PROPEN (GAUZE/BANDAGES/DRESSINGS) ×1
DERMABOND ADVANCED (GAUZE/BANDAGES/DRESSINGS) ×1
DERMABOND ADVANCED .7 DNX12 (GAUZE/BANDAGES/DRESSINGS) ×1 IMPLANT
DERMABOND ADVANCED .7 DNX6 (GAUZE/BANDAGES/DRESSINGS) IMPLANT
DRAPE LAPAROTOMY 100X72X124 (DRAPES) ×2 IMPLANT
DRAPE MICROSCOPE LEICA (MISCELLANEOUS) ×2 IMPLANT
DRAPE POUCH INSTRU U-SHP 10X18 (DRAPES) ×2 IMPLANT
DRAPE PROXIMA HALF (DRAPES) IMPLANT
DRAPE SURG 17X23 STRL (DRAPES) ×2 IMPLANT
DRSG OPSITE 4X5.5 SM (GAUZE/BANDAGES/DRESSINGS) ×2 IMPLANT
DRSG OPSITE POSTOP 4X6 (GAUZE/BANDAGES/DRESSINGS) ×1 IMPLANT
ELECT REM PT RETURN 9FT ADLT (ELECTROSURGICAL) ×2
ELECTRODE REM PT RTRN 9FT ADLT (ELECTROSURGICAL) ×1 IMPLANT
GAUZE SPONGE 4X4 12PLY STRL (GAUZE/BANDAGES/DRESSINGS) ×2 IMPLANT
GAUZE SPONGE 4X4 16PLY XRAY LF (GAUZE/BANDAGES/DRESSINGS) IMPLANT
GLOVE BIO SURGEON STRL SZ8 (GLOVE) ×2 IMPLANT
GLOVE BIOGEL PI IND STRL 7.0 (GLOVE) IMPLANT
GLOVE BIOGEL PI IND STRL 8.5 (GLOVE) IMPLANT
GLOVE BIOGEL PI INDICATOR 7.0 (GLOVE) ×2
GLOVE BIOGEL PI INDICATOR 8.5 (GLOVE) ×1
GLOVE ECLIPSE 7.5 STRL STRAW (GLOVE) IMPLANT
GLOVE ECLIPSE 8.0 STRL XLNG CF (GLOVE) ×1 IMPLANT
GLOVE EXAM NITRILE LRG STRL (GLOVE) IMPLANT
GLOVE EXAM NITRILE MD LF STRL (GLOVE) IMPLANT
GLOVE EXAM NITRILE XL STR (GLOVE) IMPLANT
GLOVE EXAM NITRILE XS STR PU (GLOVE) IMPLANT
GLOVE INDICATOR 8.5 STRL (GLOVE) ×2 IMPLANT
GLOVE SS BIOGEL STRL SZ 6.5 (GLOVE) IMPLANT
GLOVE SUPERSENSE BIOGEL SZ 6.5 (GLOVE) ×3
GLOVE SURG SS PI 7.0 STRL IVOR (GLOVE) ×2 IMPLANT
GOWN STRL REUS W/ TWL LRG LVL3 (GOWN DISPOSABLE) ×1 IMPLANT
GOWN STRL REUS W/ TWL XL LVL3 (GOWN DISPOSABLE) ×2 IMPLANT
GOWN STRL REUS W/TWL 2XL LVL3 (GOWN DISPOSABLE) ×1 IMPLANT
GOWN STRL REUS W/TWL LRG LVL3 (GOWN DISPOSABLE) ×4
GOWN STRL REUS W/TWL XL LVL3 (GOWN DISPOSABLE) ×4
KIT BASIN OR (CUSTOM PROCEDURE TRAY) ×2 IMPLANT
KIT ROOM TURNOVER OR (KITS) ×2 IMPLANT
NDL SPNL 22GX3.5 QUINCKE BK (NEEDLE) ×1 IMPLANT
NEEDLE HYPO 22GX1.5 SAFETY (NEEDLE) ×2 IMPLANT
NEEDLE SPNL 22GX3.5 QUINCKE BK (NEEDLE) ×2 IMPLANT
NS IRRIG 1000ML POUR BTL (IV SOLUTION) ×2 IMPLANT
PACK LAMINECTOMY NEURO (CUSTOM PROCEDURE TRAY) ×2 IMPLANT
RUBBERBAND STERILE (MISCELLANEOUS) ×4 IMPLANT
SPONGE SURGIFOAM ABS GEL SZ50 (HEMOSTASIS) ×2 IMPLANT
STRIP CLOSURE SKIN 1/2X4 (GAUZE/BANDAGES/DRESSINGS) ×2 IMPLANT
SUT VIC AB 0 CT1 18XCR BRD8 (SUTURE) ×1 IMPLANT
SUT VIC AB 0 CT1 8-18 (SUTURE) ×2
SUT VIC AB 2-0 CT1 18 (SUTURE) ×2 IMPLANT
SUT VICRYL 4-0 PS2 18IN ABS (SUTURE) ×2 IMPLANT
SYR 20ML ECCENTRIC (SYRINGE) ×2 IMPLANT
TOWEL OR 17X24 6PK STRL BLUE (TOWEL DISPOSABLE) ×2 IMPLANT
TOWEL OR 17X26 10 PK STRL BLUE (TOWEL DISPOSABLE) ×2 IMPLANT
WATER STERILE IRR 1000ML POUR (IV SOLUTION) ×2 IMPLANT

## 2014-04-03 NOTE — Transfer of Care (Signed)
Immediate Anesthesia Transfer of Care Note  Patient: Shawn Velazquez  Procedure(s) Performed: Procedure(s): Left Thoracic Seven to Eight, Thoracic Eight to Nine Thoracic Discectomy  (Left)  Patient Location: PACU  Anesthesia Type:General  Level of Consciousness: awake and alert   Airway & Oxygen Therapy: Patient Spontanous Breathing and Patient connected to nasal cannula oxygen  Post-op Assessment: Report given to PACU RN and Post -op Vital signs reviewed and stable  Post vital signs: Reviewed and stable  Complications: No apparent anesthesia complications

## 2014-04-03 NOTE — Progress Notes (Signed)
Dr Tobias Alexander at bedside to see pt prior to transfer to ICU

## 2014-04-03 NOTE — Anesthesia Procedure Notes (Signed)
Procedure Name: Intubation Date/Time: 04/03/2014 1:55 PM Performed by: Ollen Bowl Pre-anesthesia Checklist: Patient identified, Emergency Drugs available, Suction available, Patient being monitored and Timeout performed Patient Re-evaluated:Patient Re-evaluated prior to inductionOxygen Delivery Method: Circle system utilized and Simple face mask Preoxygenation: Pre-oxygenation with 100% oxygen Intubation Type: IV induction Ventilation: Mask ventilation without difficulty and Oral airway inserted - appropriate to patient size Laryngoscope Size: Miller and 3 Grade View: Grade I Tube type: Oral Tube size: 7.5 mm Number of attempts: 1 Airway Equipment and Method: Patient positioned with wedge pillow and Stylet Placement Confirmation: ETT inserted through vocal cords under direct vision,  positive ETCO2 and breath sounds checked- equal and bilateral Secured at: 20 cm Tube secured with: Tape Dental Injury: Teeth and Oropharynx as per pre-operative assessment

## 2014-04-03 NOTE — Anesthesia Postprocedure Evaluation (Signed)
Anesthesia Post Note  Patient: Shawn Velazquez  Procedure(s) Performed: Procedure(s) (LRB): Left Thoracic Seven to Eight, Thoracic Eight to Nine Thoracic Discectomy  (Left)  Anesthesia type: general  Patient location: PACU  Post pain: Pain level controlled  Post assessment: Patient's Cardiovascular Status Stable  Last Vitals:  Filed Vitals:   04/03/14 1739  BP:   Pulse: 119  Temp: 36.2 C  Resp: 16    Post vital signs: Reviewed and stable  Level of consciousness: sedated  Complications: Hypotension and tachycardia on emergence, responsive to phenylepherine and volume.  Tachycardia improving.  D/c to ICU for bipap.

## 2014-04-03 NOTE — H&P (Signed)
Shawn Velazquez is an 48 y.o. male.   Chief Complaint: Back and left-sided chest and abdominal pain HPI: Patient is a 48 year old some is a progress worsening pain would radiate from his back across his lower rate along the tract of the bottom rib catheter between his umbilicus in his nipple line. This is been refractory to conservative treatment only occurs on the left the 9 a right-sided symptoms. Workup revealed a large disc herniation at T7-8 displaced the left t8 nerve root as well as severe stenosis at T8-9. The patient's progression of clinical syndrome and imaging findings and takes her treatment I recommended thoracic laminectomy and discectomy at T7-8 and decompressive laminectomy at T8-9. I extensively reviewed the risks and benefits of the operation the patient as well as perioperative course and expectations of outcome and alternatives of surgery he understood and agreed to proceed forward.  Past Medical History  Diagnosis Date  . Diabetes mellitus     diet controlled  . Hypertension   . Anxiety   . Chronic back pain   . Wears glasses   . Glaucoma     slight case  . Eczema   . Psoriasis   . GERD (gastroesophageal reflux disease)   . Fatty liver, alcoholic   . Sleep apnea     Past Surgical History  Procedure Laterality Date  . Rotator cuff repair    . Posterior cervical laminectomy  04/21/2012    Procedure: POSTERIOR CERVICAL LAMINECTOMY;  Surgeon: Elaina Hoops, MD;  Location: [facility];  Service: Neurosurgery;  Laterality: Left;  Posterior Cervical laminectomy/foraminotomy and diskectomy, left cervical seven-thoracic one  . Multiple tooth extractions      Family History  Problem Relation Age of Onset  . Diabetes Father   . Cancer - Lung Father   . Hypertension Other   . Cancer - Lung Other    Social History:  reports that he has been smoking E-cigarettes.  He has been smoking about 0.50 packs per day. He has never used smokeless tobacco. He reports that he drinks  alcohol. He reports that he does not use illicit drugs.  Allergies:  Allergies  Allergen Reactions  . Neosporin + Pain Relief Max St [Neomy-Bacit-Polymyx-Pramoxine] Rash    Medications Prior to Admission  Medication Sig Dispense Refill  . ALPRAZolam (XANAX) 1 MG tablet Take 1 mg by mouth 4 (four) times daily as needed for anxiety.      . citalopram (CELEXA) 40 MG tablet Take 40 mg by mouth daily.      . hydrochlorothiazide (HYDRODIURIL) 25 MG tablet Take 25 mg by mouth daily.       Marland Kitchen HYDROcodone-acetaminophen (NORCO/VICODIN) 5-325 MG per tablet Take 1-2 tablets by mouth every 6 (six) hours as needed for moderate pain.      Marland Kitchen ibuprofen (ADVIL,MOTRIN) 800 MG tablet Take 800 mg by mouth every 8 (eight) hours as needed for moderate pain. For pain      . losartan (COZAAR) 50 MG tablet Take 50-100 mg by mouth daily as needed (high blood pressure).       . metFORMIN (GLUCOPHAGE) 500 MG tablet Take 500 mg by mouth daily with breakfast.       . Multiple Vitamin (MULTIVITAMIN WITH MINERALS) TABS Take 1 tablet by mouth daily.        Results for orders placed during the hospital encounter of 04/03/14 (from the past 48 hour(s))  GLUCOSE, CAPILLARY     Status: Abnormal   Collection Time  04/03/14  9:38 AM      Result Value Ref Range   Glucose-Capillary 140 (*) 70 - 99 mg/dL   No results found.  Review of Systems  Constitutional: Negative.   Eyes: Negative.   Respiratory: Negative.   Cardiovascular: Negative.   Gastrointestinal: Negative.   Genitourinary: Negative.   Musculoskeletal: Positive for back pain, joint pain and myalgias.  Skin: Negative.   Neurological: Positive for tingling and sensory change.  Endo/Heme/Allergies: Negative.   Psychiatric/Behavioral: Negative.     Blood pressure 128/68, pulse 70, temperature 98.6 F (37 C), temperature source Oral, resp. rate 16, SpO2 99.00%. Physical Exam  Constitutional: He is oriented to person, place, and time. He appears  well-developed and well-nourished.  HENT:  Head: Normocephalic.  Eyes: Pupils are equal, round, and reactive to light.  Neck: Normal range of motion.  Cardiovascular: Normal rate.   Respiratory: Effort normal.  GI: Soft. Bowel sounds are normal.  Musculoskeletal: Normal range of motion.  Neurological: He is alert and oriented to person, place, and time. He has normal strength. GCS eye subscore is 4. GCS verbal subscore is 5. GCS motor subscore is 6.  Reflex Scores:      Tricep reflexes are 2+ on the right side and 2+ on the left side.      Bicep reflexes are 2+ on the right side and 2+ on the left side.      Brachioradialis reflexes are 2+ on the right side and 2+ on the left side.      Patellar reflexes are 2+ on the right side and 2+ on the left side.      Achilles reflexes are 2+ on the right side and 2+ on the left side. Strength is 5 out of 5 in his iliopsoas, quads, hip she's, gastrocs and into tibialis, EHL.  Skin: Skin is warm and dry.     Assessment/Plan For 9 years and presents for a left-sided T7-8 thoracic laminectomy and discectomy and T8-9 decompressive laminectomy  Shandra Szymborski P 04/03/2014, 1:41 PM

## 2014-04-03 NOTE — Anesthesia Preprocedure Evaluation (Addendum)
Anesthesia Evaluation    Airway Mallampati: II TM Distance: >3 FB Neck ROM: Full    Dental  (+) Edentulous Upper, Partial Lower, Dental Advisory Given   Pulmonary sleep apnea and Continuous Positive Airway Pressure Ventilation , Current Smoker,  breath sounds clear to auscultation        Cardiovascular hypertension, Pt. on medications Rhythm:Regular Rate:Normal     Neuro/Psych Anxiety    GI/Hepatic GERD-  Medicated and Controlled,  Endo/Other  diabetes, Type 2, Oral Hypoglycemic Agents  Renal/GU      Musculoskeletal   Abdominal   Peds  Hematology   Anesthesia Other Findings   Reproductive/Obstetrics                          Anesthesia Physical Anesthesia Plan  ASA: III  Anesthesia Plan: General   Post-op Pain Management:    Induction: Intravenous  Airway Management Planned: Oral ETT  Additional Equipment:   Intra-op Plan:   Post-operative Plan: Extubation in OR  Informed Consent: I have reviewed the patients History and Physical, chart, labs and discussed the procedure including the risks, benefits and alternatives for the proposed anesthesia with the patient or authorized representative who has indicated his/her understanding and acceptance.   Dental advisory given  Plan Discussed with: CRNA  Anesthesia Plan Comments:         Anesthesia Quick Evaluation

## 2014-04-03 NOTE — Progress Notes (Signed)
Placed pt. On CPAP of 10cm H2O via nasal mask with 4L O2 bled in. Pt. Is tolerating CPAP well at this time without any complications.

## 2014-04-03 NOTE — Op Note (Signed)
Operative diagnosis: Herniated nucleus pulposus T7-8 left and lumbar spinal stenosis T8-9  Postoperative diagnosis: Same  Procedure: Transpedicular discectomy T7-T8 and decompressive thoracic laminectomy T8-9 with microdissection of the left T7, T8 nerve roots  Surgeon: Dominica Severin Keshia Weare  Assistant: Kristeen Miss  Anesthesia: Gen.  EBL: Minimal  History of present illness: Patient is a very pleasant 48 year old gentleman has had progressive worsening back and left-sided chest wall pain rating down just underneath his ribs halfway between his nipple line his umbilicus. This is refractory to all forms of conservative treatment workup with an MRI scan showed a large disc herniation T7-8 displacing the left T7 and T8 nerve roots as well as spinal stenosis extending to above the T8-9. Due to his failure conservative treatment imaging findings and progression of clinical syndrome I recommended transpedicular discectomy at T7-T8 with extension of his laminotomy down to the T8-9 disc space. I extensively went over the risks and benefits of the operation the patient as well as perioperative course expectations of outcome and alternatives of surgery he understood and agreed to proceed forward.  Operative procedure: Patient is new since general anesthesia positioned prone flat rolls his back was prepped and draped in routine sterile fashion preoperative x-ray with AP thoracic file was used to identify the T8 rib counting up from the 12th rib then after infiltration 10 cc lidocaine to be midline incision was made and Bovie light cautery was used to gas exchange tissues subperiosteal dissections care lamina of T7-T8 and T9 interoperative x-ray identified the disc space rate above the T8 rib and T8 pedicle. So the inferior aspect of the T7 lamina was removed as well as the superior two thirds of the T8 lamina the lateral facet complex was drilled down and using a 2 into the Kerrison punch laminotomy was performed removing  ligament flavum exposing the thecal sac. Under MAC scalp illumination the lateral canal was identified the exiting T7 VII nerve root was immediately identified and this was actually flattened against the undersurface the facet joint and displaced inferiorly working up underneath it I removed a very large free fragment disc herniation that had migrated superiorly from the disc space and was distorting the T7 nerve root. After removal of the free fragment C7 nerve root resumed its normal anatomic position. The disc spaces identified and there was extensive amount of disc material underneath the thecal sac so I drilled down the pedicle T8-1 working into the disc space from the pedicle performed a transpedicular discectomy using nerve hooks and ball curettes the teased additional fragments out from underneath the thecal sac. At the discectomy there is no further stenosis on thecal sac or T7 the proximal T8 nerve root. I then extended the laminotomy inferiorly virtually through the complete aspect of the T8 lamina down to below the T8 pedicle and decompress the T8 nerve root. There is marked facet arthropathy causing some stenosis below the pedicle T8 as well. At the end decompress is no further stenosis at this level out of the wounds and copiously are good fixing space was maintained Gelfoam was laid up the dura the muscle and fascia were reapproximated with interrupted Vicryl's and skin was closed with 4 subcuticular Dermabond benzo and Steri-Strips 4 x 4's and sterile dressing were applied and patient recovered in stable condition. At the end of case on it counts sponge counts were correct.

## 2014-04-04 ENCOUNTER — Encounter (HOSPITAL_COMMUNITY): Payer: Self-pay | Admitting: Neurosurgery

## 2014-04-04 DIAGNOSIS — G4730 Sleep apnea, unspecified: Secondary | ICD-10-CM | POA: Diagnosis not present

## 2014-04-04 DIAGNOSIS — E118 Type 2 diabetes mellitus with unspecified complications: Secondary | ICD-10-CM | POA: Diagnosis not present

## 2014-04-04 DIAGNOSIS — M5124 Other intervertebral disc displacement, thoracic region: Secondary | ICD-10-CM | POA: Diagnosis not present

## 2014-04-04 DIAGNOSIS — Z720 Tobacco use: Secondary | ICD-10-CM | POA: Diagnosis not present

## 2014-04-04 DIAGNOSIS — I10 Essential (primary) hypertension: Secondary | ICD-10-CM | POA: Diagnosis not present

## 2014-04-04 DIAGNOSIS — F419 Anxiety disorder, unspecified: Secondary | ICD-10-CM | POA: Diagnosis not present

## 2014-04-04 DIAGNOSIS — R339 Retention of urine, unspecified: Secondary | ICD-10-CM | POA: Diagnosis not present

## 2014-04-04 DIAGNOSIS — K219 Gastro-esophageal reflux disease without esophagitis: Secondary | ICD-10-CM | POA: Diagnosis not present

## 2014-04-04 DIAGNOSIS — M4804 Spinal stenosis, thoracic region: Secondary | ICD-10-CM | POA: Diagnosis not present

## 2014-04-04 MED ORDER — OXYCODONE-ACETAMINOPHEN 5-325 MG PO TABS: 1.0000 | ORAL_TABLET | ORAL | Status: DC | PRN

## 2014-04-04 MED ORDER — OXYCODONE-ACETAMINOPHEN 5-325 MG PO TABS
1.0000 | ORAL_TABLET | ORAL | Status: DC | PRN
  Administered 2014-04-04: 2 via ORAL
  Filled 2014-04-04: qty 2

## 2014-04-04 MED ORDER — TAMSULOSIN HCL 0.4 MG PO CAPS
0.4000 mg | ORAL_CAPSULE | Freq: Every day | ORAL | Status: DC
  Administered 2014-04-04: 0.4 mg via ORAL
  Filled 2014-04-04: qty 1

## 2014-04-04 MED ORDER — TAMSULOSIN HCL 0.4 MG PO CAPS: 0.4000 mg | ORAL_CAPSULE | Freq: Every day | ORAL | Status: DC

## 2014-04-04 MED ORDER — IBUPROFEN 800 MG PO TABS
800.0000 mg | ORAL_TABLET | Freq: Three times a day (TID) | ORAL | Status: DC | PRN
  Filled 2014-04-04: qty 1

## 2014-04-04 MED ORDER — CYCLOBENZAPRINE HCL 10 MG PO TABS: 10.0000 mg | ORAL_TABLET | Freq: Three times a day (TID) | ORAL | Status: DC | PRN

## 2014-04-04 NOTE — Discharge Summary (Signed)
  Physician Discharge Summary  Patient ID: NINO AMANO MRN: [id number] DOB/AGE: [birth year] 48 y.o.  Admit date: 04/03/2014 Discharge date: 04/04/2014  Admission Diagnose: Herniated nucleus pulposus T7-8 spinal stenosis T8-9  Discharge Diagnoses: Same Active Problems:   HNP (herniated nucleus pulposus), thoracic   Discharged Condition: good  Hospital Course: Patient to the operating room underwent transpedicular dissected discectomy at T7-T8 and decompression at T9 postoperative patient did very well with the ICU because of sleep apnea and CPAP requirements was stable the morning of discharge except for some urinary retention. However this is improving throughout the morning will be discharged later this afternoon if her resolves  Consults: Significant Diagnostic Studies: Treatments: T7-T8 transpedicular discectomy T8 and decompression Discharge Exam: Blood pressure 137/69, pulse 103, temperature 98.8 F (37.1 C), temperature source Oral, resp. rate 16, height 5\' 6"  (1.676 m), weight 91.2 kg (201 lb 1 oz), SpO2 91.00%. Strength out of 5 wound clean dry and intact  Disposition: Home     Medication List         ALPRAZolam 1 MG tablet  Commonly known as:  XANAX  Take 1 mg by mouth 4 (four) times daily as needed for anxiety.     citalopram 40 MG tablet  Commonly known as:  CELEXA  Take 40 mg by mouth daily.     cyclobenzaprine 10 MG tablet  Commonly known as:  FLEXERIL  Take 1 tablet (10 mg total) by mouth 3 (three) times daily as needed for muscle spasms.     hydrochlorothiazide 25 MG tablet  Commonly known as:  HYDRODIURIL  Take 25 mg by mouth daily.     HYDROcodone-acetaminophen 5-325 MG per tablet  Commonly known as:  NORCO/VICODIN  Take 1-2 tablets by mouth every 6 (six) hours as needed for moderate pain.     ibuprofen 800 MG tablet  Commonly known as:  ADVIL,MOTRIN  Take 800 mg by mouth every 8 (eight) hours as needed for moderate pain. For pain     losartan 50 MG tablet  Commonly known as:  COZAAR  Take 50-100 mg by mouth daily as needed (high blood pressure).     metFORMIN 500 MG tablet  Commonly known as:  GLUCOPHAGE  Take 500 mg by mouth daily with breakfast.     multivitamin with minerals Tabs tablet  Take 1 tablet by mouth daily.     oxyCODONE-acetaminophen 5-325 MG per tablet  Commonly known as:  PERCOCET/ROXICET  Take 1-2 tablets by mouth every 4 (four) hours as needed for moderate pain.     tamsulosin 0.4 MG Caps capsule  Commonly known as:  FLOMAX  Take 1 capsule (0.4 mg total) by mouth daily.           Follow-up Information   Follow up with [facility] P, MD.   Specialty:  Neurosurgery   Contact information:   [address] [place] [address] [phone]       Signed: Burna Atlas P 04/04/2014, 8:19 AM

## 2014-04-04 NOTE — Progress Notes (Signed)
Patient ID: Shawn Velazquez, male   DOB: 10/20/1965, 48 y.o.   MRN: [id number] Is doing well no chest wall pain incisional pain only  Strength out of 5 wound clean dry and intact  Some issues with urinary retention however improving throughout the morning we'll continue to observe this morning and initiate Flomax and discharge if his retention resolves

## 2014-04-04 NOTE — Discharge Instructions (Signed)
No lifting no bending no twisting no driving a riding a car in that she's come back and forth to see me. Keep incision clean dry and intact. May remove the outer dressing to 3 days cover the Steri-Strips with Saran wrap for showers only

## 2014-10-26 IMAGING — CR DG CHEST 2V
2 series · 2 of 2 positions shown · non-contrast
Comparison: 02/20/2014

CLINICAL DATA: Preoperative evaluation for thoracic surgery

EXAM:
CHEST  2 VIEW

[w chest pa]
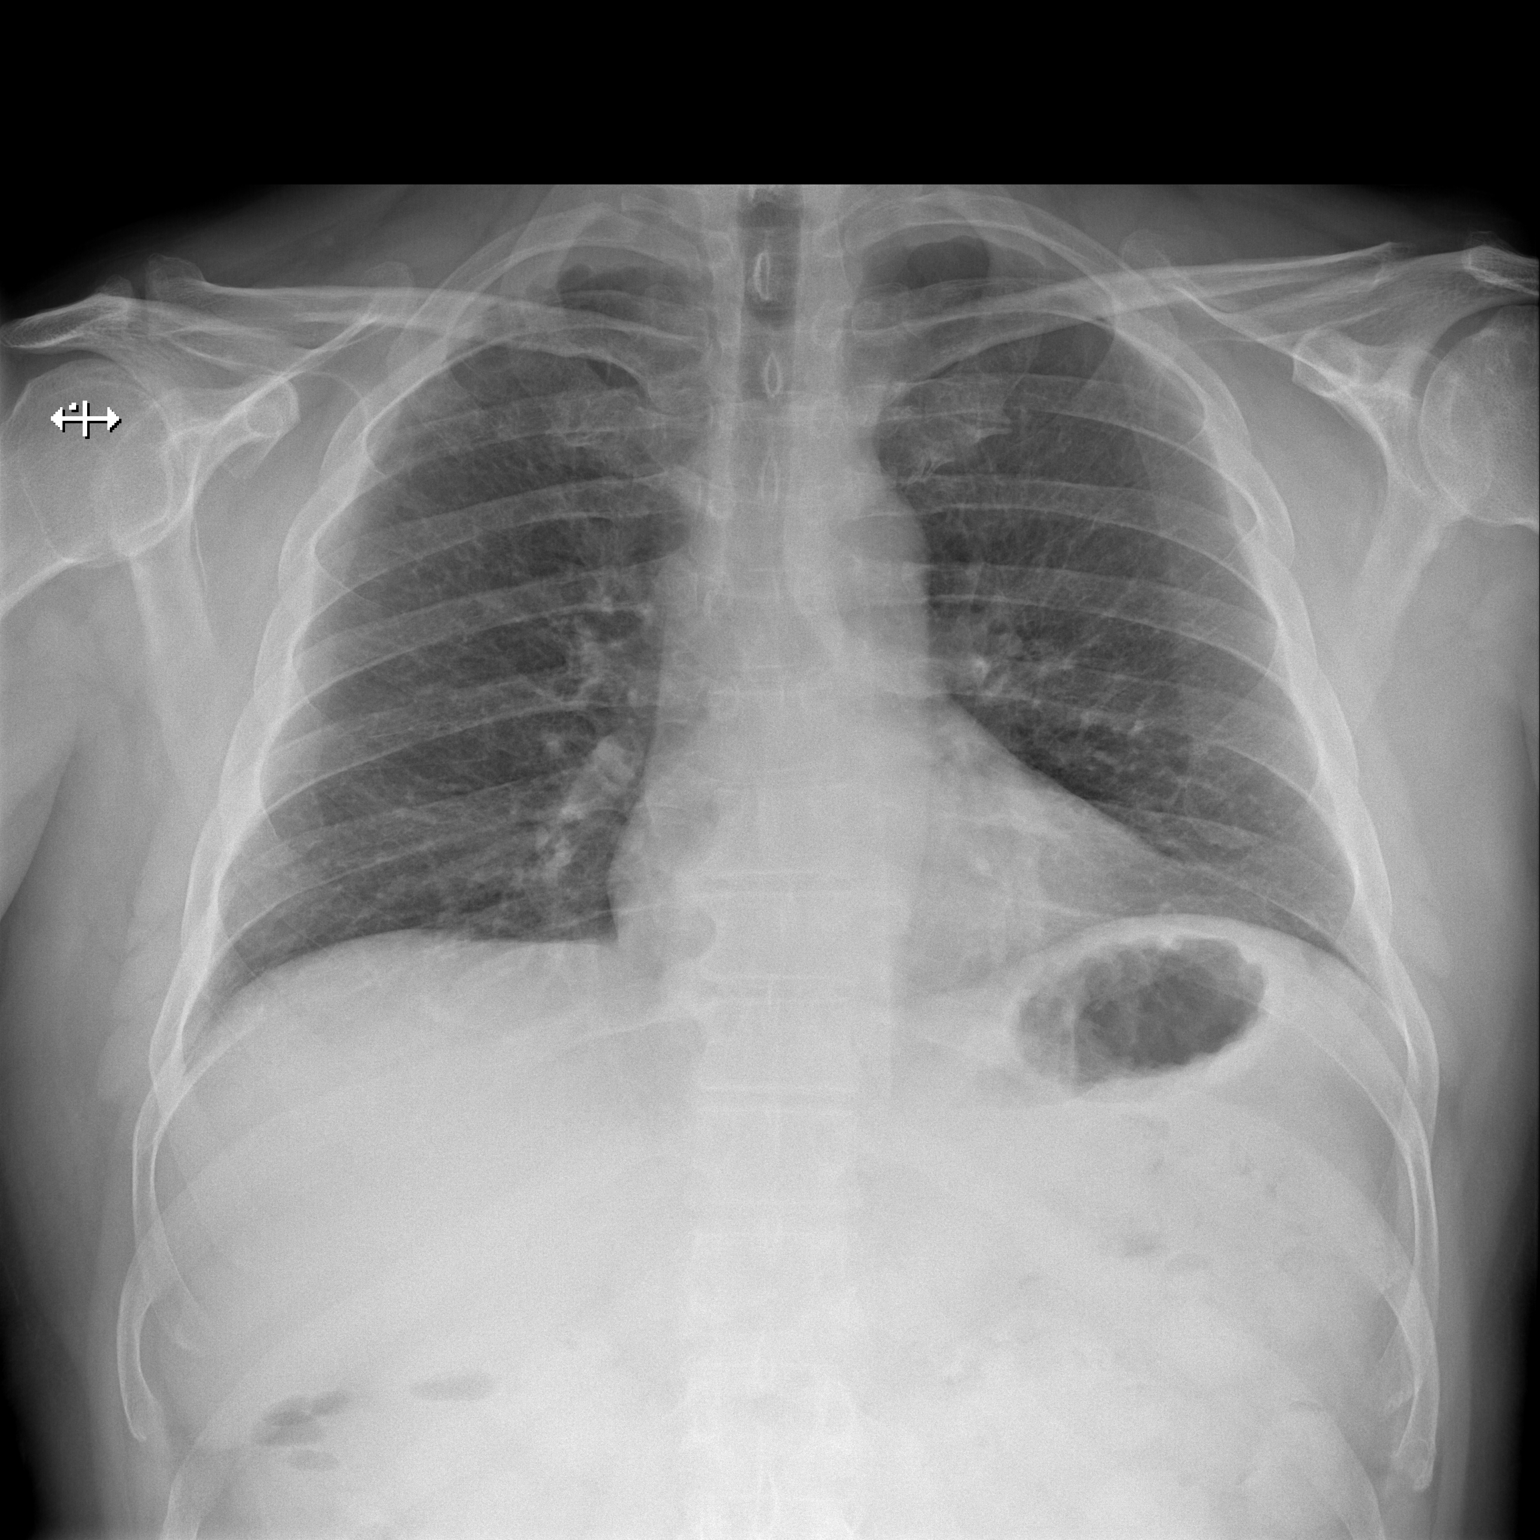

[w chest lat]
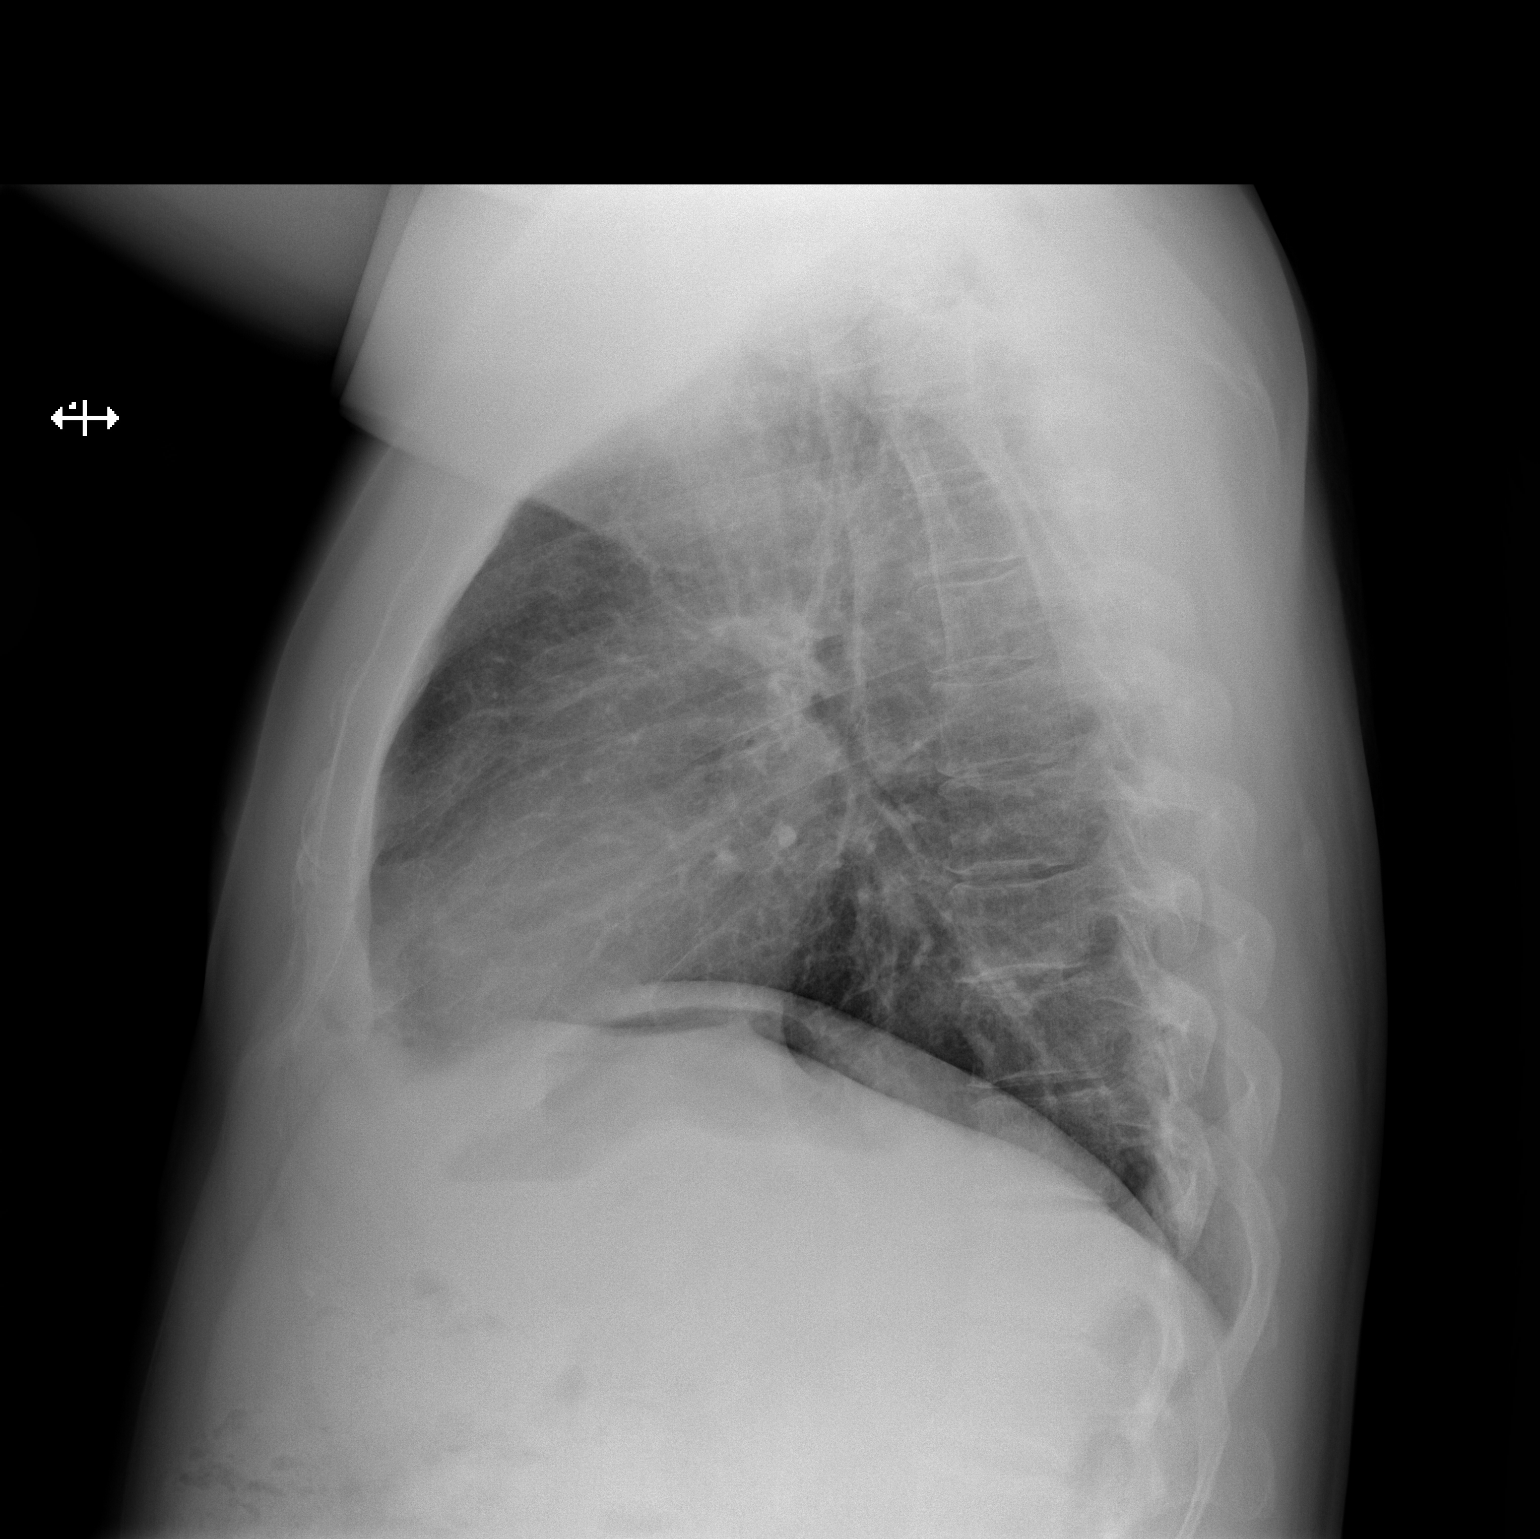

[2 of 2 positions shown; findings below may reference images not displayed]

FINDINGS: The heart size and mediastinal contours are within normal limits.
Both lungs are clear. The visualized skeletal structures are
unremarkable.
IMPRESSION: No active cardiopulmonary disease.

## 2014-10-31 IMAGING — CR DG THORACIC SPINE 2V
1 series · 1 of 1 positions shown · non-contrast
Comparison: MRI thoracic spine 03/17/2014

CLINICAL DATA: Intraoperative localization for spine surgery.

EXAM:
THORACIC SPINE - 2 VIEW

[ap]
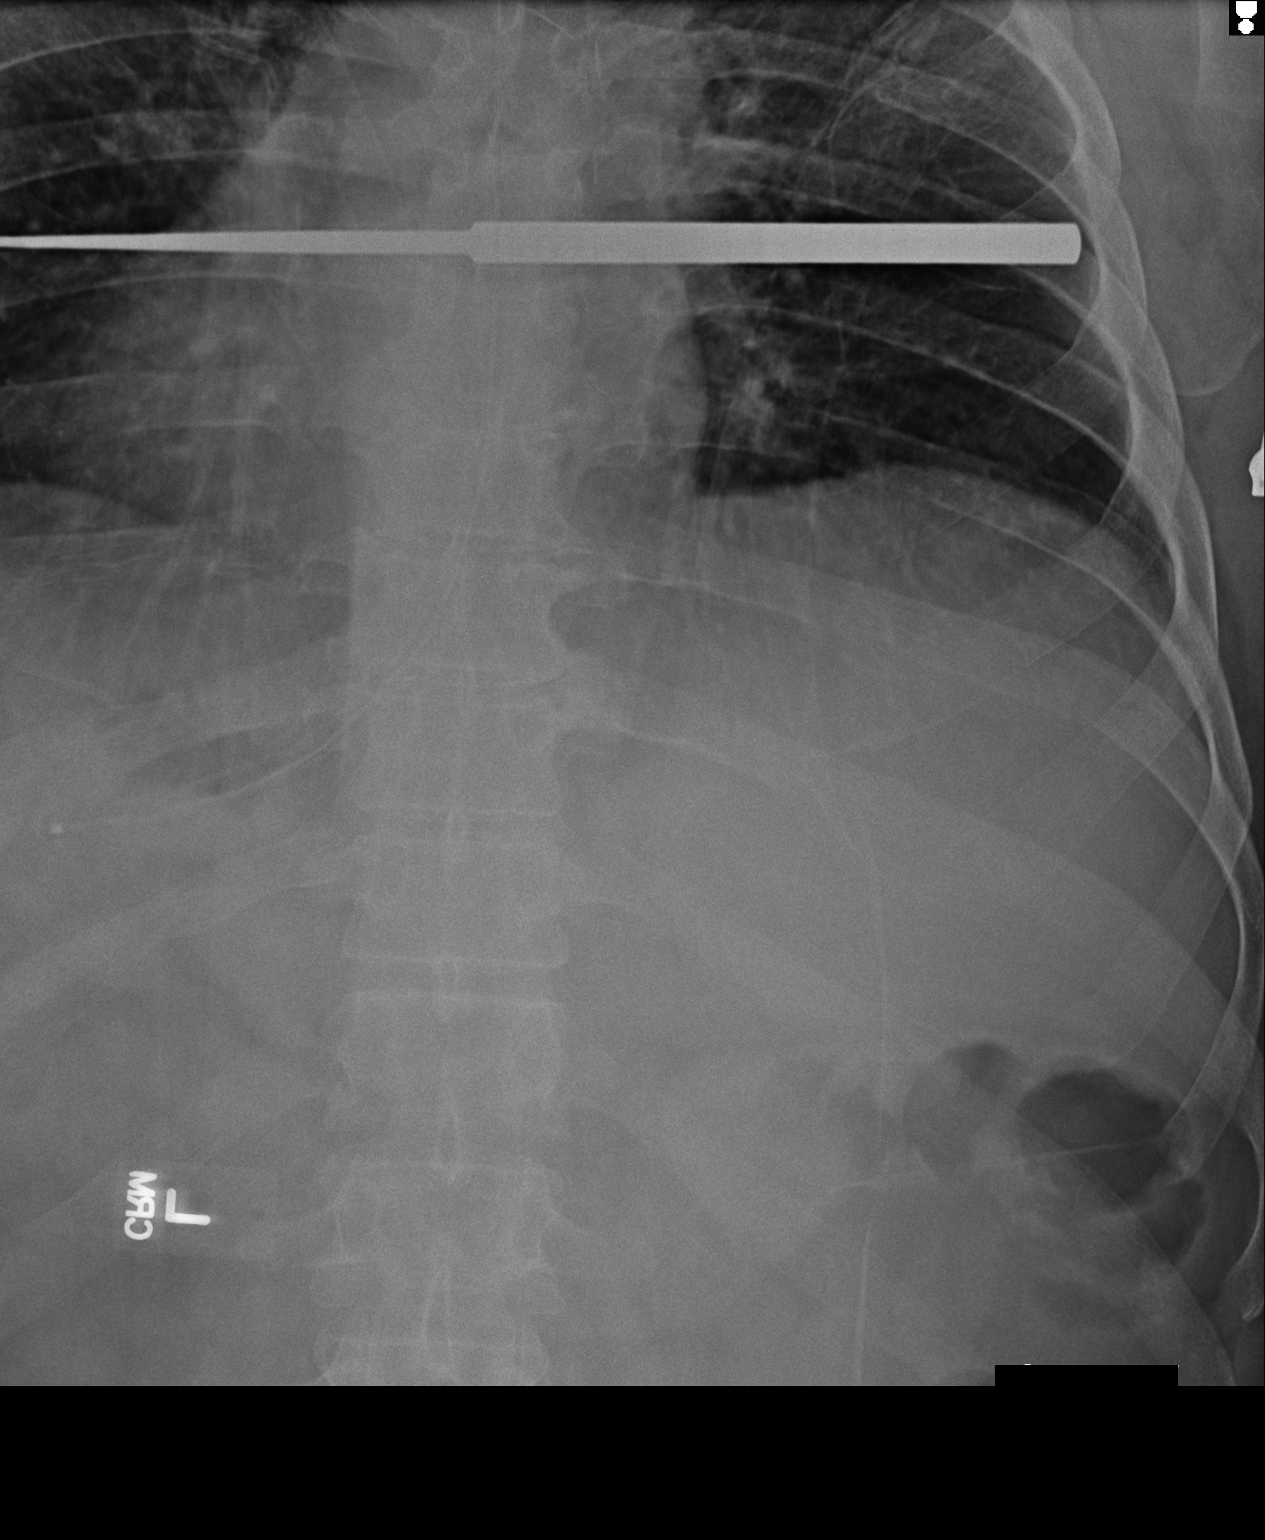

[1 of 1 positions shown; findings below may reference images not displayed]

FINDINGS: The first film demonstrates a surgical instrument marking the T6
level.

The second film demonstrates surgical retractors and a surgical
instrument marking the T7-8 level.
IMPRESSION: T6 and T7-8 marked intraoperatively.

## 2017-09-11 ENCOUNTER — Emergency Department (HOSPITAL_COMMUNITY)
Admission: EM | Admit: 2017-09-11 | Discharge: 2017-09-11 | Disposition: A | Discharge status: Home / Self Care | Payer: BLUE CROSS/BLUE SHIELD | Attending: Emergency Medicine | Admitting: Emergency Medicine

## 2017-09-11 ENCOUNTER — Encounter (HOSPITAL_COMMUNITY): Payer: Self-pay | Admitting: Emergency Medicine

## 2017-09-11 ENCOUNTER — Other Ambulatory Visit: Payer: Self-pay

## 2017-09-11 ENCOUNTER — Emergency Department (HOSPITAL_COMMUNITY): Payer: BLUE CROSS/BLUE SHIELD

## 2017-09-11 DIAGNOSIS — R42 Dizziness and giddiness: Secondary | ICD-10-CM | POA: Diagnosis not present

## 2017-09-11 DIAGNOSIS — E876 Hypokalemia: Secondary | ICD-10-CM | POA: Diagnosis not present

## 2017-09-11 DIAGNOSIS — I10 Essential (primary) hypertension: Secondary | ICD-10-CM | POA: Diagnosis not present

## 2017-09-11 DIAGNOSIS — R079 Chest pain, unspecified: Secondary | ICD-10-CM | POA: Diagnosis present

## 2017-09-11 DIAGNOSIS — R0789 Other chest pain: Secondary | ICD-10-CM | POA: Insufficient documentation

## 2017-09-11 DIAGNOSIS — F17210 Nicotine dependence, cigarettes, uncomplicated: Secondary | ICD-10-CM | POA: Diagnosis not present

## 2017-09-11 DIAGNOSIS — Z79899 Other long term (current) drug therapy: Secondary | ICD-10-CM | POA: Diagnosis not present

## 2017-09-11 HISTORY — DX: Dizziness and giddiness: R42

## 2017-09-11 LAB — BASIC METABOLIC PANEL
Anion gap: 16 — ABNORMAL HIGH (ref 5–15)
Anion gap: 11 (ref 5–15)
BUN: 7 mg/dL (ref 6–20)
BUN: 6 mg/dL (ref 6–20)
CALCIUM: 9.1 mg/dL (ref 8.9–10.3)
CO2: 22 mmol/L (ref 22–32)
CO2: 24 mmol/L (ref 22–32)
CREATININE: 0.83 mg/dL (ref 0.61–1.24)
Calcium: 8.8 mg/dL — ABNORMAL LOW (ref 8.9–10.3)
Chloride: 91 mmol/L — ABNORMAL LOW (ref 101–111)
Chloride: 99 mmol/L — ABNORMAL LOW (ref 101–111)
Creatinine, Ser: 0.92 mg/dL (ref 0.61–1.24)
GFR calc Af Amer: >60 mL/min (ref >60)
GFR calc Af Amer: >60 mL/min (ref >60)
GFR calc non Af Amer: >60 mL/min (ref >60)
GFR calc non Af Amer: >60 mL/min (ref >60)
GLUCOSE: 173 mg/dL — AB (ref 65–99)
GLUCOSE: 127 mg/dL — AB (ref 65–99)
Potassium: 2.9 mmol/L — ABNORMAL LOW (ref 3.5–5.1)
Potassium: 4.7 mmol/L (ref 3.5–5.1)
Sodium: 129 mmol/L — ABNORMAL LOW (ref 135–145)
Sodium: 134 mmol/L — ABNORMAL LOW (ref 135–145)

## 2017-09-11 LAB — CBC
HCT: 37.6 % — ABNORMAL LOW (ref 39.0–52.0)
Hemoglobin: 13.1 g/dL (ref 13.0–17.0)
MCH: 32.8 pg (ref 26.0–34.0)
MCHC: 34.8 g/dL (ref 30.0–36.0)
MCV: 94 fL (ref 78.0–100.0)
Platelets: 156 10*3/uL (ref 150–400)
RBC: 4 MIL/uL — ABNORMAL LOW (ref 4.22–5.81)
RDW: 12.2 % (ref 11.5–15.5)
WBC: 5.9 10*3/uL (ref 4.0–10.5)

## 2017-09-11 LAB — I-STAT TROPONIN, ED: TROPONIN I, POC: 0.02 ng/mL (ref 0.00–0.08)

## 2017-09-11 LAB — MAGNESIUM: Magnesium: 1.3 mg/dL — ABNORMAL LOW (ref 1.7–2.4)

## 2017-09-11 MED ORDER — POTASSIUM CHLORIDE CRYS ER 20 MEQ PO TBCR
40.0000 meq | EXTENDED_RELEASE_TABLET | Freq: Once | ORAL | Status: AC
  Administered 2017-09-11: 40 meq via ORAL
  Filled 2017-09-11: qty 2

## 2017-09-11 MED ORDER — MAGNESIUM SULFATE 2 GM/50ML IV SOLN
2.0000 g | Freq: Once | INTRAVENOUS | Status: AC
  Administered 2017-09-11: 2 g via INTRAVENOUS
  Filled 2017-09-11: qty 50

## 2017-09-11 MED ORDER — SODIUM CHLORIDE 0.9 % IV BOLUS (SEPSIS)
1000.0000 mL | Freq: Once | INTRAVENOUS | Status: AC
  Administered 2017-09-11: 1000 mL via INTRAVENOUS

## 2017-09-11 MED ORDER — LORAZEPAM 2 MG/ML IJ SOLN
1.0000 mg | Freq: Once | INTRAMUSCULAR | Status: AC
  Administered 2017-09-11: 1 mg via INTRAVENOUS
  Filled 2017-09-11: qty 1

## 2017-09-11 NOTE — ED Provider Notes (Addendum)
[facility] EMERGENCY DEPARTMENT Provider Note   CSN: [id number] Arrival date & time: 09/11/17  1725     History   Chief Complaint Chief Complaint  Patient presents with  . Chest Pain    HPI Shawn Velazquez is a 52 y.o. male.  HPI complains of dizziness meeting sensation of room spinning onset 3 days ago.  Them similar to vertigo he experienced for several decades. symptoms worse with changing positions and improved with remaining still.  No diplopia.  No headache.  No hearing change.  Symptoms associated with nausea, vomiting and dry heaves.  He thinks he may have vomited 20 times.  He treated himself with meclizine with partial relief.  Also complains of chest pain constant since 3 AM yesterday.  Pain is left-sided parasternal nonradiating and worse with reaching for something with left arm or changing positions.  Nonexertional.  Associated symptoms include mild shortness of breath Past Medical History:  Diagnosis Date  . Anxiety   . Chronic back pain   . Diabetes mellitus    diet controlled  . Eczema   . Fatty liver, alcoholic   . GERD (gastroesophageal reflux disease)   . Glaucoma    slight case  . Hypertension   . Psoriasis   . Sleep apnea   . Wears glasses   Vertigo since he was in his 63s  Patient Active Problem List   Diagnosis Date Noted  . HNP (herniated nucleus pulposus), thoracic 04/03/2014    Past Surgical History:  Procedure Laterality Date  . MULTIPLE TOOTH EXTRACTIONS    . POSTERIOR CERVICAL LAMINECTOMY  04/21/2012   Procedure: POSTERIOR CERVICAL LAMINECTOMY;  Surgeon: Elaina Hoops, MD;  Location: [place] [facility];  Service: Neurosurgery;  Laterality: Left;  Posterior Cervical laminectomy/foraminotomy and diskectomy, left cervical seven-thoracic one  . ROTATOR CUFF REPAIR    . THORACIC DISCECTOMY Left 04/03/2014   Procedure: Left Thoracic Seven to Eight, Thoracic Eight to Nine Thoracic Discectomy ;  Surgeon: Elaina Hoops, MD;  Location: [place] [facility];   Service: Neurosurgery;  Laterality: Left;       Home Medications    Prior to Admission medications   Medication Sig Start Date End Date Taking? Authorizing Provider  atorvastatin (LIPITOR) 40 MG tablet Take 40 mg by mouth every morning.   Yes [provider]  FLUoxetine (PROZAC) 10 MG tablet Take 30 mg by mouth daily.   Yes [provider]  glimepiride (AMARYL) 1 MG tablet Take 1 mg by mouth every morning.   Yes [provider]  hydrochlorothiazide (HYDRODIURIL) 25 MG tablet Take 25 mg by mouth daily.    Yes [provider]  ibuprofen (ADVIL,MOTRIN) 800 MG tablet Take 800 mg by mouth every 8 (eight) hours as needed for moderate pain. For pain   Yes [provider]  LORazepam (ATIVAN) 1 MG tablet Take 1 mg by mouth 3 (three) times daily.   Yes [provider]  losartan (COZAAR) 50 MG tablet Take 50-100 mg by mouth daily.    Yes [provider]  metFORMIN (GLUCOPHAGE-XR) 500 MG 24 hr tablet Take 500 mg by mouth daily with breakfast.   Yes [provider]  Multiple Vitamin (MULTIVITAMIN WITH MINERALS) TABS Take 1 tablet by mouth daily.   Yes [provider]    Family History Family History  Problem Relation Age of Onset  . Diabetes Father   . Cancer - Lung Father   . Hypertension Other   . Cancer - Lung  Other     Social History Social History   Tobacco Use  . Smoking status: Current Some Day Smoker    Packs/day: 0.50    Types: E-cigarettes  . Smokeless tobacco: Never Used  . Tobacco comment: pt trying to quit on his own  Substance Use Topics  . Alcohol use: Yes    Comment: social beer  . Drug use: No     Allergies   Neosporin + pain relief max st [neomy-bacit-polymyx-pramoxine]   Review of Systems Review of Systems  Constitutional: Negative.   HENT: Negative.   Respiratory: Positive for shortness of breath.   Cardiovascular: Positive for chest pain.  Gastrointestinal: Negative.     Musculoskeletal: Negative.   Skin: Negative.   Allergic/Immunologic: Positive for immunocompromised state.       Diabetic  Neurological: Positive for dizziness.  Psychiatric/Behavioral: Negative.   All other systems reviewed and are negative.    Physical Exam Updated Vital Signs BP (!) 162/92 (BP Location: Right Arm)   Pulse 84   Temp 99.4 F (37.4 C) (Oral)   Resp 18   Ht 5\' 6"  (1.676 m)   Wt 85.7 kg (189 lb)   SpO2 98%   BMI 30.51 kg/m   Physical Exam  Constitutional: He appears distressed.  Anxious appearing  HENT:  Head: Normocephalic and atraumatic.  Eyes: Conjunctivae are normal. Pupils are equal, round, and reactive to light.  Bilateral tympanic membranes normal  Neck: Neck supple. No tracheal deviation present. No thyromegaly present.  No bruit  Cardiovascular: Normal rate and regular rhythm.  No murmur heard. Pulmonary/Chest: Effort normal and breath sounds normal.  Abdominal: Soft. Bowel sounds are normal. He exhibits no distension. There is no tenderness.  Musculoskeletal: Normal range of motion. He exhibits no edema or tenderness.  Neurological: He is alert. Coordination normal.  Comes vertiginous upon  standing from a supine position.  Vertigo improved after he stares at a fixed point.  DTR symmetric bilaterally at knee jerk and ankle jerk and biceps was downgoing bilaterally.  Gait normal.  Cranial nerves II through XII grossly intact.  Motor strength 5/5 overall  Skin: Skin is warm and dry. No rash noted.  Psychiatric: He has a normal mood and affect.  Nursing note and vitals reviewed.    ED Treatments / Results  Labs (all labs ordered are listed, but only abnormal results are displayed) Labs Reviewed  BASIC METABOLIC PANEL - Abnormal; Notable for the following components:      Result Value   Sodium 129 (*)    Potassium 2.9 (*)    Chloride 91 (*)    Glucose, Bld 173 (*)    Anion gap 16 (*)    All other components within normal limits  CBC -  Abnormal; Notable for the following components:   RBC 4.00 (*)    HCT 37.6 (*)    All other components within normal limits  I-STAT TROPONIN, ED    EKG  EKG Interpretation  Date/Time:  Sunday September 11 2017 17:35:20 EDT Ventricular Rate:  98 PR Interval:    QRS Duration: 125 QT Interval:  392 QTC Calculation: 501 R Axis:   23 Text Interpretation:  Sinus rhythm Right bundle branch block Baseline wander in lead(s) V5 V6 Right bundle branch block New since previous tracing Confirmed by [place], Inocente Salles [phone]) on 09/11/2017 6:01:24 PM       Radiology Dg Chest 2 View  Result Date: 09/11/2017 CLINICAL DATA:  52 year old male with chest tightness and dizziness.  EXAM: CHEST - 2 VIEW COMPARISON:  Chest radiograph dated 03/29/2014 FINDINGS: The heart size and mediastinal contours are within normal limits. Both lungs are clear. The visualized skeletal structures are unremarkable. IMPRESSION: No active cardiopulmonary disease. Electronically Signed   By: Anner Crete M.D.   On: 09/11/2017 18:20    Procedures Procedures (including critical care time)  Medications Ordered in ED Medications  sodium chloride 0.9 % bolus 1,000 mL (not administered)  potassium chloride SA (K-DUR,KLOR-CON) CR tablet 40 mEq (not administered)  LORazepam (ATIVAN) injection 1 mg (not administered)   Results for orders placed or performed during the hospital encounter of 52/77/82  Basic metabolic panel  Result Value Ref Range   Sodium 129 (L) 135 - 145 mmol/L   Potassium 2.9 (L) 3.5 - 5.1 mmol/L   Chloride 91 (L) 101 - 111 mmol/L   CO2 22 22 - 32 mmol/L   Glucose, Bld 173 (H) 65 - 99 mg/dL   BUN 7 6 - 20 mg/dL   Creatinine, Ser 0.92 0.61 - 1.24 mg/dL   Calcium 9.1 8.9 - 10.3 mg/dL   GFR calc non Af Amer >60 >60 mL/min   GFR calc Af Amer >60 >60 mL/min   Anion gap 16 (H) 5 - 15  CBC  Result Value Ref Range   WBC 5.9 4.0 - 10.5 K/uL   RBC 4.00 (L) 4.22 - 5.81 MIL/uL   Hemoglobin 13.1 13.0 - 17.0 g/dL     HCT 37.6 (L) 39.0 - 52.0 %   MCV 94.0 78.0 - 100.0 fL   MCH 32.8 26.0 - 34.0 pg   MCHC 34.8 30.0 - 36.0 g/dL   RDW 12.2 11.5 - 15.5 %   Platelets 156 150 - 400 K/uL  Magnesium  Result Value Ref Range   Magnesium 1.3 (L) 1.7 - 2.4 mg/dL  Basic metabolic panel  Result Value Ref Range   Sodium 134 (L) 135 - 145 mmol/L   Potassium 4.7 3.5 - 5.1 mmol/L   Chloride 99 (L) 101 - 111 mmol/L   CO2 24 22 - 32 mmol/L   Glucose, Bld 127 (H) 65 - 99 mg/dL   BUN 6 6 - 20 mg/dL   Creatinine, Ser 0.83 0.61 - 1.24 mg/dL   Calcium 8.8 (L) 8.9 - 10.3 mg/dL   GFR calc non Af Amer >60 >60 mL/min   GFR calc Af Amer >60 >60 mL/min   Anion gap 11 5 - 15  I-stat troponin, ED  Result Value Ref Range   Troponin i, poc 0.02 0.00 - 0.08 ng/mL   Comment 3           Dg Chest 2 View  Result Date: 09/11/2017 CLINICAL DATA:  52 year old male with chest tightness and dizziness. EXAM: CHEST - 2 VIEW COMPARISON:  Chest radiograph dated 03/29/2014 FINDINGS: The heart size and mediastinal contours are within normal limits. Both lungs are clear. The visualized skeletal structures are unremarkable. IMPRESSION: No active cardiopulmonary disease. Electronically Signed   By: Anner Crete M.D.   On: 09/11/2017 18:20    Initial Impression / Assessment and Plan / ED Course  I have reviewed the triage vital signs and the nursing notes.  Pertinent labs & imaging results that were available during my care of the patient were reviewed by me and considered in my medical decision making (see chart for details).     7:30 PM feels much improved after treatment with intravenous hydration with normal saline, intravenous magnesium and IV Ativan he  is alert no longer anxious appearing sitting up in bed appears comfortable 10:15 PM feels much improved and ready to go home.  Vertigo is almost gone chest pain almost gone.  He is steady on his feet able to ambulate without difficulty. Patient reports he has prescription for  lorazepam written for him which he can pick up tomorrow [facility] Controlled Substance reporting System queried Vertigo is felt to be peripheral in etiology.  Chest pain felt to be noncardiac.  Hypokalemia and  hypomagnesemia and anion gap. felt to be secondary to vomiting and anxiety.  Resolved after treatment with intravenous hydration plan follow-up with Dr.Knowlton would suggest blood pressure recheck 3 weeks I counseled patient for 5 minutes on smoking cessation Final Clinical Impressions(s) / ED Diagnoses  Diagnoses #1 vertigo Final diagnoses:  None   #2 atypical chest pain #3 hypokalemia #4 hypomagnesemia ED Discharge Orders    None     #5 tobacco abuse Orlie Dakin, MD 09/11/17 2224    Orlie Dakin, MD 09/11/17 2227

## 2017-09-11 NOTE — ED Notes (Signed)
Upon calling lab to obtain BMP results they state specimen was hemolyzed. This RN drew specimen and delivered to lab at this time.

## 2017-09-11 NOTE — Discharge Instructions (Signed)
Take Ativan (lorazepam)and meclizine as needed for dizziness.  Call Dr. Karie Kirks tomorrow to schedule an office visit.  Your blood pressure should be rechecked in the next 3 weeks.  Today's was elevated at 141/82.  Ask Dr. Karie Kirks to help you to stop smoking.

## 2017-09-11 NOTE — ED Triage Notes (Addendum)
Patient c/o midsternal chest pain/thightness with numbness in left hand and fingers that started on Friday. Per patient shortness of breath, nausea, vomiting, dizziness an weakness. Denies any cardiac hx.

## 2017-12-06 DIAGNOSIS — I10 Essential (primary) hypertension: Secondary | ICD-10-CM | POA: Diagnosis not present

## 2017-12-06 DIAGNOSIS — R945 Abnormal results of liver function studies: Secondary | ICD-10-CM | POA: Diagnosis not present

## 2017-12-06 DIAGNOSIS — G4726 Circadian rhythm sleep disorder, shift work type: Secondary | ICD-10-CM | POA: Diagnosis not present

## 2017-12-06 DIAGNOSIS — E1165 Type 2 diabetes mellitus with hyperglycemia: Secondary | ICD-10-CM | POA: Diagnosis not present

## 2018-04-19 DIAGNOSIS — I10 Essential (primary) hypertension: Secondary | ICD-10-CM | POA: Diagnosis not present

## 2018-04-19 DIAGNOSIS — E119 Type 2 diabetes mellitus without complications: Secondary | ICD-10-CM | POA: Diagnosis not present

## 2018-04-19 DIAGNOSIS — E7800 Pure hypercholesterolemia, unspecified: Secondary | ICD-10-CM | POA: Diagnosis not present

## 2018-04-19 DIAGNOSIS — E1165 Type 2 diabetes mellitus with hyperglycemia: Secondary | ICD-10-CM | POA: Diagnosis not present

## 2018-04-19 DIAGNOSIS — Z125 Encounter for screening for malignant neoplasm of prostate: Secondary | ICD-10-CM | POA: Diagnosis not present

## 2018-04-24 DIAGNOSIS — R945 Abnormal results of liver function studies: Secondary | ICD-10-CM | POA: Diagnosis not present

## 2018-04-24 DIAGNOSIS — G4726 Circadian rhythm sleep disorder, shift work type: Secondary | ICD-10-CM | POA: Diagnosis not present

## 2018-04-24 DIAGNOSIS — Z23 Encounter for immunization: Secondary | ICD-10-CM | POA: Diagnosis not present

## 2018-04-24 DIAGNOSIS — E1165 Type 2 diabetes mellitus with hyperglycemia: Secondary | ICD-10-CM | POA: Diagnosis not present

## 2018-04-24 DIAGNOSIS — F1020 Alcohol dependence, uncomplicated: Secondary | ICD-10-CM | POA: Diagnosis not present

## 2018-08-27 ENCOUNTER — Other Ambulatory Visit: Payer: Self-pay

## 2018-08-27 ENCOUNTER — Emergency Department (HOSPITAL_COMMUNITY)
Admission: EM | Admit: 2018-08-27 | Discharge: 2018-08-27 | Disposition: A | Discharge status: Home / Self Care | Payer: BLUE CROSS/BLUE SHIELD | Attending: Emergency Medicine | Admitting: Emergency Medicine

## 2018-08-27 ENCOUNTER — Encounter (HOSPITAL_COMMUNITY): Payer: Self-pay | Admitting: Emergency Medicine

## 2018-08-27 DIAGNOSIS — E119 Type 2 diabetes mellitus without complications: Secondary | ICD-10-CM | POA: Insufficient documentation

## 2018-08-27 DIAGNOSIS — K0889 Other specified disorders of teeth and supporting structures: Secondary | ICD-10-CM | POA: Diagnosis not present

## 2018-08-27 DIAGNOSIS — Z7984 Long term (current) use of oral hypoglycemic drugs: Secondary | ICD-10-CM | POA: Insufficient documentation

## 2018-08-27 DIAGNOSIS — I10 Essential (primary) hypertension: Secondary | ICD-10-CM | POA: Insufficient documentation

## 2018-08-27 DIAGNOSIS — F17290 Nicotine dependence, other tobacco product, uncomplicated: Secondary | ICD-10-CM | POA: Diagnosis not present

## 2018-08-27 DIAGNOSIS — K047 Periapical abscess without sinus: Secondary | ICD-10-CM | POA: Insufficient documentation

## 2018-08-27 DIAGNOSIS — K029 Dental caries, unspecified: Secondary | ICD-10-CM | POA: Insufficient documentation

## 2018-08-27 DIAGNOSIS — Z79899 Other long term (current) drug therapy: Secondary | ICD-10-CM | POA: Insufficient documentation

## 2018-08-27 MED ORDER — CLINDAMYCIN HCL 300 MG PO CAPS: 300.0000 mg | ORAL_CAPSULE | Freq: Four times a day (QID) | ORAL | 0 refills | Status: DC

## 2018-08-27 MED ORDER — CLINDAMYCIN PHOSPHATE 600 MG/50ML IV SOLN
600.0000 mg | Freq: Once | INTRAVENOUS | Status: AC
  Administered 2018-08-27: 600 mg via INTRAVENOUS
  Filled 2018-08-27: qty 50

## 2018-08-27 MED ORDER — OXYCODONE-ACETAMINOPHEN 5-325 MG PO TABS: 1.0000 | ORAL_TABLET | ORAL | 0 refills | Status: DC | PRN

## 2018-08-27 MED ORDER — KETOROLAC TROMETHAMINE 30 MG/ML IJ SOLN
30.0000 mg | Freq: Once | INTRAMUSCULAR | Status: AC
  Administered 2018-08-27: 30 mg via INTRAVENOUS
  Filled 2018-08-27: qty 1

## 2018-08-27 MED ORDER — OXYCODONE-ACETAMINOPHEN 5-325 MG PO TABS
1.0000 | ORAL_TABLET | Freq: Once | ORAL | Status: AC
  Administered 2018-08-27: 1 via ORAL
  Filled 2018-08-27: qty 1

## 2018-08-27 NOTE — ED Provider Notes (Signed)
[facility] EMERGENCY DEPARTMENT Provider Note   CSN: [id number] Arrival date & time: 08/27/18  1507    History   Chief Complaint Chief Complaint  Patient presents with  . Facial Swelling    HPI Shawn Velazquez is a 53 y.o. male.     HPI   Shawn Velazquez is a 53 y.o. male who presents to the Emergency Department complaining of swelling of his right lower jaw and dental pain for several days.  He states this is been a waxing and waning problem for a while.  He was seen by his dentist for this recently and told he needed to have 2 teeth extracted.  He states he woke this morning with swelling of his jaw and a numb tingling sensation of his lower lip.  He states he has not been on antibiotics for this recently.  He denies fever, chills, throat pain or difficulty swallowing, neck pain, speech difficulties, numbness or weakness of the remaining face and shortness of breath.      Past Medical History:  Diagnosis Date  . Anxiety   . Chronic back pain   . Diabetes mellitus    diet controlled  . Eczema   . Fatty liver, alcoholic   . GERD (gastroesophageal reflux disease)   . Glaucoma    slight case  . Hypertension   . Psoriasis   . Sleep apnea   . Wears glasses     Patient Active Problem List   Diagnosis Date Noted  . Atypical chest pain 09/11/2017  . Vertigo 09/11/2017  . HNP (herniated nucleus pulposus), thoracic 04/03/2014    Past Surgical History:  Procedure Laterality Date  . MULTIPLE TOOTH EXTRACTIONS    . POSTERIOR CERVICAL LAMINECTOMY  04/21/2012   Procedure: POSTERIOR CERVICAL LAMINECTOMY;  Surgeon: Elaina Hoops, MD;  Location: [place] [facility];  Service: Neurosurgery;  Laterality: Left;  Posterior Cervical laminectomy/foraminotomy and diskectomy, left cervical seven-thoracic one  . ROTATOR CUFF REPAIR    . THORACIC DISCECTOMY Left 04/03/2014   Procedure: Left Thoracic Seven to Eight, Thoracic Eight to Nine Thoracic Discectomy ;  Surgeon: Elaina Hoops, MD;   Location: [place] [facility];  Service: Neurosurgery;  Laterality: Left;        Home Medications    Prior to Admission medications   Medication Sig Start Date End Date Taking? Authorizing Provider  atorvastatin (LIPITOR) 40 MG tablet Take 40 mg by mouth every morning.    [provider]  clindamycin (CLEOCIN) 300 MG capsule Take 1 capsule (300 mg total) by mouth 4 (four) times daily. 08/27/18   Avien Taha, PA-C  FLUoxetine (PROZAC) 10 MG tablet Take 30 mg by mouth daily.    [provider]  glimepiride (AMARYL) 1 MG tablet Take 1 mg by mouth every morning.    [provider]  hydrochlorothiazide (HYDRODIURIL) 25 MG tablet Take 25 mg by mouth daily.     [provider]  ibuprofen (ADVIL,MOTRIN) 800 MG tablet Take 800 mg by mouth every 8 (eight) hours as needed for moderate pain. For pain    [provider]  LORazepam (ATIVAN) 1 MG tablet Take 1 mg by mouth 3 (three) times daily.    [provider]  losartan (COZAAR) 50 MG tablet Take 50-100 mg by mouth daily.     [provider]  metFORMIN (GLUCOPHAGE-XR) 500 MG 24 hr tablet Take 500 mg by mouth daily with breakfast.    [provider]  Multiple Vitamin (MULTIVITAMIN WITH  MINERALS) TABS Take 1 tablet by mouth daily.    [provider]  oxyCODONE-acetaminophen (PERCOCET/ROXICET) 5-325 MG tablet Take 1 tablet by mouth every 4 (four) hours as needed. 08/27/18   Kem Parkinson, PA-C    Family History Family History  Problem Relation Age of Onset  . Diabetes Father   . Cancer - Lung Father   . Hypertension Other   . Cancer - Lung Other     Social History Social History   Tobacco Use  . Smoking status: Current Some Day Smoker    Packs/day: 1.00    Types: E-cigarettes, Cigarettes  . Smokeless tobacco: Never Used  . Tobacco comment: pt trying to quit on his own  Substance Use Topics  . Alcohol use: Yes    Comment: social beer  . Drug use: No      Allergies   Neosporin + pain relief max st [neomy-bacit-polymyx-pramoxine]   Review of Systems Review of Systems  Constitutional: Negative for appetite change and fever.  HENT: Positive for dental problem and facial swelling. Negative for congestion, sore throat, trouble swallowing and voice change.        Numbness tingling of the lower lip  Eyes: Negative for pain and visual disturbance.  Respiratory: Negative for chest tightness and shortness of breath.   Cardiovascular: Negative for chest pain.  Musculoskeletal: Negative for neck pain and neck stiffness.  Neurological: Negative for dizziness, syncope, facial asymmetry and speech difficulty.  Hematological: Negative for adenopathy.     Physical Exam Updated Vital Signs BP (!) 167/93 (BP Location: Right Arm)   Pulse 70   Temp (!) 97.4 F (36.3 C) (Oral)   Resp 19   Ht 5\' 6"  (1.676 m)   Wt 90.7 kg   SpO2 99%   BMI 32.28 kg/m   Physical Exam Vitals signs and nursing note reviewed.  Constitutional:      General: He is not in acute distress.    Appearance: Normal appearance. He is not ill-appearing.  HENT:     Head: Atraumatic.     Right Ear: Tympanic membrane and ear canal normal.     Left Ear: Tympanic membrane and ear canal normal.     Mouth/Throat:     Mouth: Mucous membranes are moist.     Dentition: Dental tenderness and dental caries present.     Pharynx: Oropharynx is clear. No oropharyngeal exudate or posterior oropharyngeal erythema.     Comments: Multiple dental caries with tenderness to palpation of the right lower second premolar and first molar.  Second premolar is decayed to the level of the gingiva.  Focal edema and induration along the right lower mandible.  No fluctuance or erythema of the gingiva. no edema of the lower lip Eyes:     Extraocular Movements: Extraocular movements intact.     Conjunctiva/sclera: Conjunctivae normal.     Pupils: Pupils are equal, round, and reactive to light.  Neck:      Musculoskeletal: Normal range of motion. No neck rigidity or muscular tenderness.  Cardiovascular:     Rate and Rhythm: Normal rate and regular rhythm.     Pulses: Normal pulses.  Pulmonary:     Effort: Pulmonary effort is normal.     Breath sounds: Normal breath sounds.  Musculoskeletal: Normal range of motion.  Skin:    General: Skin is warm.     Capillary Refill: Capillary refill takes less than 2 seconds.     Findings: No rash.  Neurological:  General: No focal deficit present.     Mental Status: He is alert.     GCS: GCS eye subscore is 4. GCS verbal subscore is 5. GCS motor subscore is 6.     Cranial Nerves: No dysarthria or facial asymmetry.     Sensory: Sensation is intact. No sensory deficit.     Motor: Motor function is intact. No weakness.     Coordination: Coordination normal.     Gait: Gait is intact. Gait normal.     Comments: CN II-XII intact.  Speech is clear, no pronator drift, normal finger-nose and heel shin testing.  Strength is strong and symmetrical bilaterally,  no facial weakness or dysarthria.      ED Treatments / Results  Labs (all labs ordered are listed, but only abnormal results are displayed) Labs Reviewed - No data to display  EKG None  Radiology No results found.  Procedures Procedures (including critical care time)  Medications Ordered in ED Medications  ketorolac (TORADOL) 30 MG/ML injection 30 mg (has no administration in time range)  clindamycin (CLEOCIN) IVPB 600 mg (600 mg Intravenous New Bag/Given 08/27/18 1725)  oxyCODONE-acetaminophen (PERCOCET/ROXICET) 5-325 MG per tablet 1 tablet (1 tablet Oral Given 08/27/18 1726)     Initial Impression / Assessment and Plan / ED Course  I have reviewed the triage vital signs and the nursing notes.  Pertinent labs & imaging results that were available during my care of the patient were reviewed by me and considered in my medical decision making (see chart for details).         Patient is well-appearing.  Nontoxic-appearing.  Airway is patent no concerning symptoms for Ludewig's angina.  He reports numbness and tingling localized to the lower lip only.  There is no focal neuro deficits on his exam.   I feel this is likely related to a developing dental abscess.  There is no drainable abscess on his exam today.  Patient does have a dentist and agrees to close follow-up.  Short course of pain medication and antibiotics prescribed.  He appears appropriate for discharge home and agrees to plan.  Strict return precautions were discussed.  Final Clinical Impressions(s) / ED Diagnoses   Final diagnoses:  Dental abscess    ED Discharge Orders         Ordered    clindamycin (CLEOCIN) 300 MG capsule  4 times daily     08/27/18 1803    oxyCODONE-acetaminophen (PERCOCET/ROXICET) 5-325 MG tablet  Every 4 hours PRN     08/27/18 1803           Kem Parkinson, PA-C 08/27/18 1815    Nat Christen, MD 08/30/18 1600

## 2018-08-27 NOTE — Discharge Instructions (Addendum)
Warm salt water rinses 2-3 times a day.  Take the antibiotic as directed until its finished.  Be sure to follow-up with your dentist this week.

## 2018-08-27 NOTE — ED Notes (Signed)
Here for dental abscess  Facial swelling where teeth need repair/removal

## 2018-08-27 NOTE — ED Triage Notes (Signed)
Swelling and pain to rt jaw area for past few days.  Seen by the dentist recently but not given abx.  Needs teeth extracted in this area

## 2018-08-28 DIAGNOSIS — E1165 Type 2 diabetes mellitus with hyperglycemia: Secondary | ICD-10-CM | POA: Diagnosis not present

## 2018-08-28 DIAGNOSIS — M272 Inflammatory conditions of jaws: Secondary | ICD-10-CM | POA: Diagnosis not present

## 2018-08-28 DIAGNOSIS — E7800 Pure hypercholesterolemia, unspecified: Secondary | ICD-10-CM | POA: Diagnosis not present

## 2018-08-28 DIAGNOSIS — I10 Essential (primary) hypertension: Secondary | ICD-10-CM | POA: Diagnosis not present

## 2018-12-18 DIAGNOSIS — R945 Abnormal results of liver function studies: Secondary | ICD-10-CM | POA: Diagnosis not present

## 2018-12-18 DIAGNOSIS — Z79891 Long term (current) use of opiate analgesic: Secondary | ICD-10-CM | POA: Diagnosis not present

## 2018-12-18 DIAGNOSIS — I10 Essential (primary) hypertension: Secondary | ICD-10-CM | POA: Diagnosis not present

## 2018-12-18 DIAGNOSIS — E1165 Type 2 diabetes mellitus with hyperglycemia: Secondary | ICD-10-CM | POA: Diagnosis not present

## 2019-03-13 DIAGNOSIS — F332 Major depressive disorder, recurrent severe without psychotic features: Secondary | ICD-10-CM | POA: Diagnosis not present

## 2019-03-13 DIAGNOSIS — Z23 Encounter for immunization: Secondary | ICD-10-CM | POA: Diagnosis not present

## 2019-03-21 DIAGNOSIS — E119 Type 2 diabetes mellitus without complications: Secondary | ICD-10-CM | POA: Diagnosis not present

## 2019-03-21 DIAGNOSIS — Z1329 Encounter for screening for other suspected endocrine disorder: Secondary | ICD-10-CM | POA: Diagnosis not present

## 2019-03-21 DIAGNOSIS — Z1321 Encounter for screening for nutritional disorder: Secondary | ICD-10-CM | POA: Diagnosis not present

## 2019-03-21 DIAGNOSIS — E782 Mixed hyperlipidemia: Secondary | ICD-10-CM | POA: Diagnosis not present

## 2019-03-21 DIAGNOSIS — R5383 Other fatigue: Secondary | ICD-10-CM | POA: Diagnosis not present

## 2019-03-21 DIAGNOSIS — I10 Essential (primary) hypertension: Secondary | ICD-10-CM | POA: Diagnosis not present

## 2019-04-04 DIAGNOSIS — I10 Essential (primary) hypertension: Secondary | ICD-10-CM | POA: Diagnosis not present

## 2019-04-04 DIAGNOSIS — E119 Type 2 diabetes mellitus without complications: Secondary | ICD-10-CM | POA: Diagnosis not present

## 2019-04-04 DIAGNOSIS — G4700 Insomnia, unspecified: Secondary | ICD-10-CM | POA: Diagnosis not present

## 2019-04-04 DIAGNOSIS — R945 Abnormal results of liver function studies: Secondary | ICD-10-CM | POA: Diagnosis not present

## 2019-04-13 DIAGNOSIS — I10 Essential (primary) hypertension: Secondary | ICD-10-CM | POA: Diagnosis not present

## 2019-04-13 DIAGNOSIS — E1165 Type 2 diabetes mellitus with hyperglycemia: Secondary | ICD-10-CM | POA: Diagnosis not present

## 2019-07-26 DIAGNOSIS — E782 Mixed hyperlipidemia: Secondary | ICD-10-CM | POA: Diagnosis not present

## 2019-07-26 DIAGNOSIS — E119 Type 2 diabetes mellitus without complications: Secondary | ICD-10-CM | POA: Diagnosis not present

## 2019-07-26 DIAGNOSIS — E1165 Type 2 diabetes mellitus with hyperglycemia: Secondary | ICD-10-CM | POA: Diagnosis not present

## 2019-07-26 DIAGNOSIS — I10 Essential (primary) hypertension: Secondary | ICD-10-CM | POA: Diagnosis not present

## 2019-08-03 DIAGNOSIS — F332 Major depressive disorder, recurrent severe without psychotic features: Secondary | ICD-10-CM | POA: Diagnosis not present

## 2019-08-03 DIAGNOSIS — R945 Abnormal results of liver function studies: Secondary | ICD-10-CM | POA: Diagnosis not present

## 2019-08-03 DIAGNOSIS — I10 Essential (primary) hypertension: Secondary | ICD-10-CM | POA: Diagnosis not present

## 2019-08-03 DIAGNOSIS — G4700 Insomnia, unspecified: Secondary | ICD-10-CM | POA: Diagnosis not present

## 2019-10-22 DIAGNOSIS — K760 Fatty (change of) liver, not elsewhere classified: Secondary | ICD-10-CM | POA: Diagnosis not present

## 2019-10-24 DIAGNOSIS — G4733 Obstructive sleep apnea (adult) (pediatric): Secondary | ICD-10-CM | POA: Diagnosis not present

## 2019-11-09 DIAGNOSIS — E119 Type 2 diabetes mellitus without complications: Secondary | ICD-10-CM | POA: Diagnosis not present

## 2019-11-09 DIAGNOSIS — I10 Essential (primary) hypertension: Secondary | ICD-10-CM | POA: Diagnosis not present

## 2019-11-09 DIAGNOSIS — E1165 Type 2 diabetes mellitus with hyperglycemia: Secondary | ICD-10-CM | POA: Diagnosis not present

## 2019-11-09 DIAGNOSIS — E782 Mixed hyperlipidemia: Secondary | ICD-10-CM | POA: Diagnosis not present

## 2019-11-14 DIAGNOSIS — F332 Major depressive disorder, recurrent severe without psychotic features: Secondary | ICD-10-CM | POA: Diagnosis not present

## 2019-11-14 DIAGNOSIS — I10 Essential (primary) hypertension: Secondary | ICD-10-CM | POA: Diagnosis not present

## 2019-11-14 DIAGNOSIS — G4700 Insomnia, unspecified: Secondary | ICD-10-CM | POA: Diagnosis not present

## 2019-11-14 DIAGNOSIS — R945 Abnormal results of liver function studies: Secondary | ICD-10-CM | POA: Diagnosis not present

## 2019-11-28 DIAGNOSIS — I10 Essential (primary) hypertension: Secondary | ICD-10-CM | POA: Diagnosis not present

## 2019-11-28 DIAGNOSIS — R945 Abnormal results of liver function studies: Secondary | ICD-10-CM | POA: Diagnosis not present

## 2019-11-28 DIAGNOSIS — F332 Major depressive disorder, recurrent severe without psychotic features: Secondary | ICD-10-CM | POA: Diagnosis not present

## 2019-11-28 DIAGNOSIS — G4700 Insomnia, unspecified: Secondary | ICD-10-CM | POA: Diagnosis not present

## 2019-12-12 DIAGNOSIS — E1165 Type 2 diabetes mellitus with hyperglycemia: Secondary | ICD-10-CM | POA: Diagnosis not present

## 2019-12-26 DIAGNOSIS — E1165 Type 2 diabetes mellitus with hyperglycemia: Secondary | ICD-10-CM | POA: Diagnosis not present

## 2020-01-09 DIAGNOSIS — E1165 Type 2 diabetes mellitus with hyperglycemia: Secondary | ICD-10-CM | POA: Diagnosis not present

## 2020-02-06 DIAGNOSIS — E1165 Type 2 diabetes mellitus with hyperglycemia: Secondary | ICD-10-CM | POA: Diagnosis not present

## 2020-02-11 DIAGNOSIS — E1165 Type 2 diabetes mellitus with hyperglycemia: Secondary | ICD-10-CM | POA: Diagnosis not present

## 2020-02-20 DIAGNOSIS — E1165 Type 2 diabetes mellitus with hyperglycemia: Secondary | ICD-10-CM | POA: Diagnosis not present

## 2020-03-06 DIAGNOSIS — R945 Abnormal results of liver function studies: Secondary | ICD-10-CM | POA: Diagnosis not present

## 2020-03-06 DIAGNOSIS — E782 Mixed hyperlipidemia: Secondary | ICD-10-CM | POA: Diagnosis not present

## 2020-03-06 DIAGNOSIS — E1165 Type 2 diabetes mellitus with hyperglycemia: Secondary | ICD-10-CM | POA: Diagnosis not present

## 2020-03-06 DIAGNOSIS — I10 Essential (primary) hypertension: Secondary | ICD-10-CM | POA: Diagnosis not present

## 2020-03-06 DIAGNOSIS — Z712 Person consulting for explanation of examination or test findings: Secondary | ICD-10-CM | POA: Diagnosis not present

## 2020-03-06 DIAGNOSIS — K760 Fatty (change of) liver, not elsewhere classified: Secondary | ICD-10-CM | POA: Diagnosis not present

## 2020-03-06 DIAGNOSIS — E119 Type 2 diabetes mellitus without complications: Secondary | ICD-10-CM | POA: Diagnosis not present

## 2020-04-08 DIAGNOSIS — I10 Essential (primary) hypertension: Secondary | ICD-10-CM | POA: Diagnosis not present

## 2020-04-08 DIAGNOSIS — R945 Abnormal results of liver function studies: Secondary | ICD-10-CM | POA: Diagnosis not present

## 2020-04-08 DIAGNOSIS — G4700 Insomnia, unspecified: Secondary | ICD-10-CM | POA: Diagnosis not present

## 2020-04-08 DIAGNOSIS — F332 Major depressive disorder, recurrent severe without psychotic features: Secondary | ICD-10-CM | POA: Diagnosis not present

## 2020-04-21 DIAGNOSIS — K760 Fatty (change of) liver, not elsewhere classified: Secondary | ICD-10-CM | POA: Diagnosis not present

## 2020-04-21 DIAGNOSIS — Z712 Person consulting for explanation of examination or test findings: Secondary | ICD-10-CM | POA: Diagnosis not present

## 2020-04-21 DIAGNOSIS — I10 Essential (primary) hypertension: Secondary | ICD-10-CM | POA: Diagnosis not present

## 2020-04-21 DIAGNOSIS — E119 Type 2 diabetes mellitus without complications: Secondary | ICD-10-CM | POA: Diagnosis not present

## 2020-04-22 DIAGNOSIS — H1031 Unspecified acute conjunctivitis, right eye: Secondary | ICD-10-CM | POA: Diagnosis not present

## 2020-04-22 DIAGNOSIS — I10 Essential (primary) hypertension: Secondary | ICD-10-CM | POA: Diagnosis not present

## 2020-04-22 DIAGNOSIS — E876 Hypokalemia: Secondary | ICD-10-CM | POA: Diagnosis not present

## 2020-04-29 DIAGNOSIS — Z23 Encounter for immunization: Secondary | ICD-10-CM | POA: Diagnosis not present

## 2020-04-29 DIAGNOSIS — H01111 Allergic dermatitis of right upper eyelid: Secondary | ICD-10-CM | POA: Diagnosis not present

## 2020-07-01 ENCOUNTER — Encounter: Payer: Self-pay | Admitting: Nurse Practitioner

## 2020-07-01 ENCOUNTER — Ambulatory Visit (INDEPENDENT_AMBULATORY_CARE_PROVIDER_SITE_OTHER): Payer: BC Managed Care – PPO | Admitting: Nurse Practitioner

## 2020-07-01 ENCOUNTER — Other Ambulatory Visit: Payer: Self-pay

## 2020-07-01 VITALS — BP 166/100 | HR 90 | Temp 98.8°F | Resp 20 | Ht 66.0 in | Wt 203.0 lb

## 2020-07-01 DIAGNOSIS — F419 Anxiety disorder, unspecified: Secondary | ICD-10-CM | POA: Diagnosis not present

## 2020-07-01 DIAGNOSIS — I10 Essential (primary) hypertension: Secondary | ICD-10-CM | POA: Insufficient documentation

## 2020-07-01 DIAGNOSIS — M2550 Pain in unspecified joint: Secondary | ICD-10-CM | POA: Diagnosis not present

## 2020-07-01 DIAGNOSIS — Z139 Encounter for screening, unspecified: Secondary | ICD-10-CM

## 2020-07-01 DIAGNOSIS — Z7689 Persons encountering health services in other specified circumstances: Secondary | ICD-10-CM

## 2020-07-01 DIAGNOSIS — E785 Hyperlipidemia, unspecified: Secondary | ICD-10-CM | POA: Insufficient documentation

## 2020-07-01 DIAGNOSIS — F1020 Alcohol dependence, uncomplicated: Secondary | ICD-10-CM | POA: Insufficient documentation

## 2020-07-01 DIAGNOSIS — Z1211 Encounter for screening for malignant neoplasm of colon: Secondary | ICD-10-CM | POA: Insufficient documentation

## 2020-07-01 DIAGNOSIS — E1165 Type 2 diabetes mellitus with hyperglycemia: Secondary | ICD-10-CM | POA: Diagnosis not present

## 2020-07-01 LAB — POCT URINALYSIS DIPSTICK
Bilirubin, UA: NEGATIVE
Blood, UA: NEGATIVE
Glucose, UA: POSITIVE — AB
Ketones, UA: NEGATIVE
Leukocytes, UA: NEGATIVE
Nitrite, UA: NEGATIVE
Protein, UA: NEGATIVE
Spec Grav, UA: 1.015 (ref 1.010–1.025)
Urobilinogen, UA: 0.2 E.U./dL
pH, UA: 7 (ref 5.0–8.0)

## 2020-07-01 NOTE — Addendum Note (Signed)
Addended by: Dellia Cloud on: 07/01/2020 04:11 PM   Modules accepted: Orders

## 2020-07-01 NOTE — Assessment & Plan Note (Signed)
-  BP 166/100 today -taking HCTZ 25 mg daily -taking losartan daily -BP likely elevated d/t alcohol abuse

## 2020-07-01 NOTE — Assessment & Plan Note (Signed)
-  no A1c to review today -takes atorvastatin daily

## 2020-07-01 NOTE — Progress Notes (Signed)
Acute Office Visit  Subjective:    Patient ID: Shawn Velazquez, male    DOB: 19-Aug-1965, 54 y.o.   MRN: [id number]  Chief Complaint  Patient presents with  . New Patient (Initial Visit)    HPI Patient is in today for new patient visit.  Transferring care from Dr. Scharlene Gloss. Last physical about 6 months ago. Last A1c was over 3 months ago. He is diabetic and uses insulin for glycemic control. He states his CBG has been so good, that he has not been needing humalog in the AM. He has hand pain related to arthritis.   His mother committed suicide 50 years ago, and he feels guilty about being a burden on her.  He has 3 biological children, and only 1 is still in contact with him. He would like counseling.  Past Medical History:  Diagnosis Date  . Anxiety   . Arthritis    affects hands, shoulder, neck, knees, hips, ankles, toes  . Chronic back pain   . Diabetes mellitus    diet controlled  . Eczema   . Fatty liver, alcoholic   . GERD (gastroesophageal reflux disease)   . Glaucoma    slight case  . Hypertension   . Psoriasis   . Sleep apnea   . Substance abuse (HCC)   . Vertigo 09/11/2017  . Wears glasses     Past Surgical History:  Procedure Laterality Date  . MULTIPLE TOOTH EXTRACTIONS    . POSTERIOR CERVICAL LAMINECTOMY  04/21/2012   Procedure: POSTERIOR CERVICAL LAMINECTOMY;  Surgeon: Mariam Dollar, MD;  Location: [facility];  Service: Neurosurgery;  Laterality: Left;  Posterior Cervical laminectomy/foraminotomy and diskectomy, left cervical seven-thoracic one  . ROTATOR CUFF REPAIR    . THORACIC DISCECTOMY Left 04/03/2014   Procedure: Left Thoracic Seven to Eight, Thoracic Eight to Nine Thoracic Discectomy ;  Surgeon: Mariam Dollar, MD;  Location: [facility];  Service: Neurosurgery;  Laterality: Left;    Family History  Problem Relation Age of Onset  . Diabetes Father   . Cancer - Lung Father   . Hypertension Other   . Cancer - Lung Other     Social History    Socioeconomic History  . Marital status: Divorced    Spouse name: Not on file  . Number of children: Not on file  . Years of education: Not on file  . Highest education level: Not on file  Occupational History    Comment: Truck to storage  Tobacco Use  . Smoking status: Current Some Day Smoker    Packs/day: 0.25    Years: 40.00    Pack years: 10.00    Types: E-cigarettes, Cigarettes  . Smokeless tobacco: Never Used  . Tobacco comment: pt trying to quit on his own  Vaping Use  . Vaping Use: Former  Substance and Sexual Activity  . Alcohol use: Yes    Comment: Either Smirnoff Smash-4 per day or Fireball-10 airplane bottles in a day  . Drug use: No  . Sexual activity: Yes    Birth control/protection: None  Other Topics Concern  . Not on file  Social History Narrative  . Not on file   Social Determinants of Health   Financial Resource Strain: Not on file  Food Insecurity: Not on file  Transportation Needs: Not on file  Physical Activity: Not on file  Stress: Not on file  Social Connections: Not on file  Intimate Partner Violence: Not on file  Outpatient Medications Prior to Visit  Medication Sig Dispense Refill  . atorvastatin (LIPITOR) 40 MG tablet Take 40 mg by mouth every morning.    Marland Kitchen FLUoxetine (PROZAC) 10 MG tablet Take 30 mg by mouth daily.    . hydrochlorothiazide (HYDRODIURIL) 25 MG tablet Take 25 mg by mouth daily.     Marland Kitchen ibuprofen (ADVIL,MOTRIN) 800 MG tablet Take 800 mg by mouth every 8 (eight) hours as needed for moderate pain. For pain    . LORazepam (ATIVAN) 1 MG tablet Take 1 mg by mouth 3 (three) times daily.    Marland Kitchen losartan (COZAAR) 50 MG tablet Take 50-100 mg by mouth daily.    . metFORMIN (GLUCOPHAGE-XR) 500 MG 24 hr tablet Take 500 mg by mouth daily with breakfast.    . clindamycin (CLEOCIN) 300 MG capsule Take 1 capsule (300 mg total) by mouth 4 (four) times daily. (Patient not taking: Reported on 07/01/2020) 28 capsule 0  . glimepiride (AMARYL)  1 MG tablet Take 1 mg by mouth every morning. (Patient not taking: Reported on 07/01/2020)    . Multiple Vitamin (MULTIVITAMIN WITH MINERALS) TABS Take 1 tablet by mouth daily. (Patient not taking: Reported on 07/01/2020)    . oxyCODONE-acetaminophen (PERCOCET/ROXICET) 5-325 MG tablet Take 1 tablet by mouth every 4 (four) hours as needed. (Patient not taking: Reported on 07/01/2020) 10 tablet 0   No facility-administered medications prior to visit.    Allergies  Allergen Reactions  . Neosporin + Pain Relief Max St [Neomy-Bacit-Polymyx-Pramoxine] Rash    Review of Systems  Constitutional: Negative.   Respiratory: Negative.   Cardiovascular: Negative.   Endocrine: Negative.   Musculoskeletal: Positive for arthralgias.  Psychiatric/Behavioral: Positive for dysphoric mood. Negative for self-injury and suicidal ideas. The patient is nervous/anxious.        Objective:    Physical Exam Constitutional:      Appearance: He is obese.  Cardiovascular:     Rate and Rhythm: Normal rate and regular rhythm.     Pulses: Normal pulses.     Heart sounds: Normal heart sounds.  Pulmonary:     Effort: Pulmonary effort is normal.     Breath sounds: Normal breath sounds.  Musculoskeletal:        General: Deformity present.     Comments: To joints of fingers  Neurological:     Mental Status: He is alert.  Psychiatric:     Comments: Tearful when bringing up his mother's suicide approx 50 years ago; drinks 10 mini-bottles of Fireball or 4 Smirnoff Smashes     BP (!) 166/100   Pulse 90   Temp 98.8 F (37.1 C)   Resp 20   Ht 5\' 6"  (1.676 m)   Wt 203 lb (92.1 kg)   SpO2 96%   BMI 32.77 kg/m  Wt Readings from Last 3 Encounters:  07/01/20 203 lb (92.1 kg)  08/27/18 200 lb (90.7 kg)  09/11/17 189 lb (85.7 kg)    Health Maintenance Due  Topic Date Due  . Hepatitis C Screening  Never done  . PNEUMOCOCCAL POLYSACCHARIDE VACCINE AGE 32-64 HIGH RISK  Never done  . FOOT EXAM  Never done  .  OPHTHALMOLOGY EXAM  Never done  . COVID-19 Vaccine (1) Never done  . HIV Screening  Never done  . TETANUS/TDAP  Never done  . COLONOSCOPY (Pts 45-49yrs Insurance coverage will need to be confirmed)  Never done  . HEMOGLOBIN A1C  10/18/2012  . INFLUENZA VACCINE  Never done  There are no preventive care reminders to display for this patient.   No results found for: TSH Lab Results  Component Value Date   WBC 5.9 09/11/2017   HGB 13.1 09/11/2017   HCT 37.6 (L) 09/11/2017   MCV 94.0 09/11/2017   PLT 156 09/11/2017   Lab Results  Component Value Date   NA 134 (L) 09/11/2017   K 4.7 09/11/2017   CO2 24 09/11/2017   GLUCOSE 127 (H) 09/11/2017   BUN 6 09/11/2017   CREATININE 0.83 09/11/2017   BILITOT 0.5 03/29/2014   ALKPHOS 67 03/29/2014   AST 48 (H) 03/29/2014   ALT 78 (H) 03/29/2014   PROT 8.0 03/29/2014   ALBUMIN 4.3 03/29/2014   CALCIUM 8.8 (L) 09/11/2017   ANIONGAP 11 09/11/2017   No results found for: CHOL No results found for: HDL No results found for: LDLCALC No results found for: TRIG No results found for: CHOLHDL Lab Results  Component Value Date   HGBA1C 7.1 (H) 04/19/2012       Assessment & Plan:   Problem List Items Addressed This Visit      Cardiovascular and Mediastinum   Hypertension, essential    -BP 166/100 today -taking HCTZ 25 mg daily -taking losartan daily -BP likely elevated d/t alcohol abuse        Endocrine   Type II diabetes mellitus, uncontrolled (HCC)    -no A1c to review today -uses insulin to manage DM -novolog per sliding scale; he will bring this in at next visit -Taking lantus 22 units at bedtime -taking Farxiga 10 mg per day; states his urine has a strong smell since starting this, will check U/A today -taking metformin BID -taking ARB and statin        Other   Anxiety    -has been taking ativan in addition to EtOH abuse -taking fluoxetine daily -refer to psych      Alcohol use disorder, moderate, dependence  (HCC)    -drinks 4 Smirnoff Smashes of 10 mini-bottles of Fireball per night -he reports that he is self-medicating for arthritis pain as well as emotion pain from his mother's suicide -refer to psychiatry, Dr. Reece Levy      Hyperlipidemia    -no A1c to review today -takes atorvastatin daily      Encounter to establish care    -will screen for HCV and HIV prior to next appt      Polyarthralgia    -has pain that affects hands, shoulders, knees, hips -will check autoimmune panel prior to next appt -will consider rheumatology vs ortho referral -tylenol limited d/t EtOH abuse -has taken ibuprofen in the past; stopped narcotics d/t abuse          No orders of the defined types were placed in this encounter.    Noreene Larsson, NP

## 2020-07-01 NOTE — Assessment & Plan Note (Addendum)
-  no A1c to review today -uses insulin to manage DM -novolog per sliding scale; he will bring this in at next visit -Taking lantus 22 units at bedtime -taking Farxiga 10 mg per day; states his urine has a strong smell since starting this -U/A today was negative for UTI -taking metformin BID -taking ARB and statin

## 2020-07-01 NOTE — Assessment & Plan Note (Addendum)
-  has been taking ativan in addition to EtOH abuse -taking fluoxetine daily -refer to psych

## 2020-07-01 NOTE — Patient Instructions (Addendum)
Shawn Velazquez, it is great to see you again today.  Please bring your medications and sliding scale in with you to your next appointment.  We will request records from Dr. Scharlene Gloss office.  For depression, I sent a referral to Dr. Jae Dire office in [place]. They will contact you to set up an appointment.  For your arthritis pain, I am drawing labs to determine if we need to see orthopedics or rheumatology.

## 2020-07-01 NOTE — Assessment & Plan Note (Signed)
-  drinks 4 Smirnoff Smashes of 10 mini-bottles of Fireball per night -he reports that he is self-medicating for arthritis pain as well as emotion pain from his mother's suicide -refer to psychiatry, Dr. Betti Cruz

## 2020-07-01 NOTE — Assessment & Plan Note (Signed)
-  has pain that affects hands, shoulders, knees, hips -will check autoimmune panel prior to next appt -will consider rheumatology vs ortho referral -tylenol limited d/t EtOH abuse -has taken ibuprofen in the past; stopped narcotics d/t abuse

## 2020-07-01 NOTE — Assessment & Plan Note (Signed)
-  will screen for HCV and HIV prior to next appt

## 2020-07-03 LAB — AUTOIMMUNE PROFILE
Anti Nuclear Antibody (ANA): NEGATIVE
Complement C3, Serum: 133 mg/dL (ref 82–167)
dsDNA Ab: <1 IU/mL (ref 0–9)

## 2020-07-03 LAB — RHEUMATOID FACTOR: Rheumatoid fact SerPl-aCnc: <10 IU/mL (ref <14.0)

## 2020-07-03 LAB — SEDIMENTATION RATE: Sed Rate: 17 mm/hr (ref 0–30)

## 2020-07-03 LAB — C-REACTIVE PROTEIN: CRP: 2 mg/L (ref 0–10)

## 2020-07-03 NOTE — Progress Notes (Signed)
Autoimmune panel is negative. Other labs have not resulted (may not have been drawn bc he needs to be fasting). We will discuss next steps at his appt.

## 2020-07-10 DIAGNOSIS — Z7689 Persons encountering health services in other specified circumstances: Secondary | ICD-10-CM | POA: Diagnosis not present

## 2020-07-10 DIAGNOSIS — Z139 Encounter for screening, unspecified: Secondary | ICD-10-CM | POA: Diagnosis not present

## 2020-07-10 DIAGNOSIS — M2550 Pain in unspecified joint: Secondary | ICD-10-CM | POA: Diagnosis not present

## 2020-07-10 DIAGNOSIS — E1165 Type 2 diabetes mellitus with hyperglycemia: Secondary | ICD-10-CM | POA: Diagnosis not present

## 2020-07-10 DIAGNOSIS — E785 Hyperlipidemia, unspecified: Secondary | ICD-10-CM | POA: Diagnosis not present

## 2020-07-11 LAB — CBC WITH DIFFERENTIAL/PLATELET
Basophils Absolute: 0.1 10*3/uL (ref 0.0–0.2)
Basos: 1 %
EOS (ABSOLUTE): 0.4 10*3/uL (ref 0.0–0.4)
Eos: 4 %
Hematocrit: 44.4 % (ref 37.5–51.0)
Hemoglobin: 14.9 g/dL (ref 13.0–17.7)
Immature Grans (Abs): 0 10*3/uL (ref 0.0–0.1)
Immature Granulocytes: 0 %
Lymphocytes Absolute: 2.3 10*3/uL (ref 0.7–3.1)
Lymphs: 25 %
MCH: 32.9 pg (ref 26.6–33.0)
MCHC: 33.6 g/dL (ref 31.5–35.7)
MCV: 98 fL — ABNORMAL HIGH (ref 79–97)
Monocytes Absolute: 1 10*3/uL — ABNORMAL HIGH (ref 0.1–0.9)
Monocytes: 11 %
Neutrophils Absolute: 5.4 10*3/uL (ref 1.4–7.0)
Neutrophils: 59 %
Platelets: 149 10*3/uL — ABNORMAL LOW (ref 150–450)
RBC: 4.53 x10E6/uL (ref 4.14–5.80)
RDW: 11.8 % (ref 11.6–15.4)
WBC: 9.2 10*3/uL (ref 3.4–10.8)

## 2020-07-11 LAB — HIV ANTIBODY (ROUTINE TESTING W REFLEX): HIV Screen 4th Generation wRfx: NONREACTIVE

## 2020-07-11 LAB — CMP14+EGFR
ALT: 73 IU/L — ABNORMAL HIGH (ref 0–44)
AST: 78 IU/L — ABNORMAL HIGH (ref 0–40)
Albumin/Globulin Ratio: 1.4 (ref 1.2–2.2)
Albumin: 4.3 g/dL (ref 3.8–4.9)
Alkaline Phosphatase: 190 IU/L — ABNORMAL HIGH (ref 44–121)
BUN/Creatinine Ratio: 15 (ref 9–20)
BUN: 17 mg/dL (ref 6–24)
Bilirubin Total: 0.7 mg/dL (ref 0.0–1.2)
CO2: 23 mmol/L (ref 20–29)
Calcium: 10.7 mg/dL — ABNORMAL HIGH (ref 8.7–10.2)
Chloride: 95 mmol/L — ABNORMAL LOW (ref 96–106)
Creatinine, Ser: 1.13 mg/dL (ref 0.76–1.27)
GFR calc Af Amer: 84 mL/min/{1.73_m2} (ref >59)
GFR calc non Af Amer: 72 mL/min/{1.73_m2} (ref >59)
Globulin, Total: 3 g/dL (ref 1.5–4.5)
Glucose: 266 mg/dL — ABNORMAL HIGH (ref 65–99)
Potassium: 3.8 mmol/L (ref 3.5–5.2)
Sodium: 137 mmol/L (ref 134–144)
Total Protein: 7.3 g/dL (ref 6.0–8.5)

## 2020-07-11 LAB — HEMOGLOBIN A1C
Est. average glucose Bld gHb Est-mCnc: 160 mg/dL
Hgb A1c MFr Bld: 7.2 % — ABNORMAL HIGH (ref 4.8–5.6)

## 2020-07-11 LAB — HCV INTERPRETATION

## 2020-07-11 LAB — LIPID PANEL WITH LDL/HDL RATIO
Cholesterol, Total: 118 mg/dL (ref 100–199)
HDL: 46 mg/dL (ref >39)
LDL Chol Calc (NIH): 46 mg/dL (ref 0–99)
LDL/HDL Ratio: 1 ratio (ref 0.0–3.6)
Triglycerides: 150 mg/dL — ABNORMAL HIGH (ref 0–149)
VLDL Cholesterol Cal: 26 mg/dL (ref 5–40)

## 2020-07-11 LAB — HCV AB W/RFLX TO VERIFICATION: HCV Ab: <0.1 s/co ratio (ref 0.0–0.9)

## 2020-07-15 ENCOUNTER — Ambulatory Visit: Payer: BC Managed Care – PPO | Admitting: Nurse Practitioner

## 2020-07-15 ENCOUNTER — Encounter: Payer: Self-pay | Admitting: Nurse Practitioner

## 2020-07-15 ENCOUNTER — Other Ambulatory Visit: Payer: Self-pay

## 2020-07-15 DIAGNOSIS — E1165 Type 2 diabetes mellitus with hyperglycemia: Secondary | ICD-10-CM | POA: Diagnosis not present

## 2020-07-15 DIAGNOSIS — M130 Polyarthritis, unspecified: Secondary | ICD-10-CM | POA: Diagnosis not present

## 2020-07-15 DIAGNOSIS — M2550 Pain in unspecified joint: Secondary | ICD-10-CM

## 2020-07-15 DIAGNOSIS — E785 Hyperlipidemia, unspecified: Secondary | ICD-10-CM

## 2020-07-15 NOTE — Patient Instructions (Signed)
We are checking labs today d/t high blood calcium.  Based on labs, we may change your medications.

## 2020-07-15 NOTE — Assessment & Plan Note (Signed)
Lab Results  Component Value Date   CALCIUM 10.7 (H) 07/10/2020    -calcium elevates; albumin normal -will check ionized calcium, vit D and PTH -if those are WNL, will consider changing HCTZ, then maybe oncology consult if med change does not help

## 2020-07-15 NOTE — Assessment & Plan Note (Signed)
Lab Results  Component Value Date   HGBA1C 7.2 (H) 07/10/2020   -goal A1c < 7, but this is much improved for him -continue novolog per sliding scale -continue faxiga 10 mg daily -continue metformin BID -on ARB and statin -continue lantus 22 units at bedtime

## 2020-07-15 NOTE — Assessment & Plan Note (Addendum)
-  autoimmune panel is negative -referred to ortho -tylenol limited d/t elevated liver enzymes in setting of EtOH abuse -affects hands, shoulder, knees, and hips; hands are worse today

## 2020-07-15 NOTE — Progress Notes (Signed)
Established Patient Office Visit  Subjective:  Patient ID: Shawn Velazquez, male    DOB: [birth year]  Age: 55 y.o. MRN: [id number]  CC:  Chief Complaint  Patient presents with  . Follow-up  . Diabetes    HPI Shawn Velazquez presents for lab follow-up.  Past Medical History:  Diagnosis Date  . Anxiety   . Arthritis    affects hands, shoulder, neck, knees, hips, ankles, toes  . Chronic back pain   . Diabetes mellitus    diet controlled  . Eczema   . Fatty liver, alcoholic   . GERD (gastroesophageal reflux disease)   . Glaucoma    slight case  . Hypertension   . Psoriasis   . Sleep apnea   . Substance abuse (HCC)   . Vertigo 09/11/2017  . Wears glasses     Past Surgical History:  Procedure Laterality Date  . MULTIPLE TOOTH EXTRACTIONS    . POSTERIOR CERVICAL LAMINECTOMY  04/21/2012   Procedure: POSTERIOR CERVICAL LAMINECTOMY;  Surgeon: Mariam Dollar, MD;  Location: [facility];  Service: Neurosurgery;  Laterality: Left;  Posterior Cervical laminectomy/foraminotomy and diskectomy, left cervical seven-thoracic one  . ROTATOR CUFF REPAIR    . THORACIC DISCECTOMY Left 04/03/2014   Procedure: Left Thoracic Seven to Eight, Thoracic Eight to Nine Thoracic Discectomy ;  Surgeon: Mariam Dollar, MD;  Location: [facility];  Service: Neurosurgery;  Laterality: Left;    Family History  Problem Relation Age of Onset  . Diabetes Father   . Cancer - Lung Father   . Hypertension Other   . Cancer - Lung Other     Social History   Socioeconomic History  . Marital status: Divorced    Spouse name: Not on file  . Number of children: Not on file  . Years of education: Not on file  . Highest education level: Not on file  Occupational History    Comment: Truck to storage  Tobacco Use  . Smoking status: Current Some Day Smoker    Packs/day: 0.25    Years: 40.00    Pack years: 10.00    Types: E-cigarettes, Cigarettes  . Smokeless tobacco: Never Used  . Tobacco comment: pt  trying to quit on his own  Vaping Use  . Vaping Use: Former  Substance and Sexual Activity  . Alcohol use: Yes    Comment: Either Smirnoff Smash-4 per day or Fireball-10 airplane bottles in a day  . Drug use: No  . Sexual activity: Yes    Birth control/protection: None  Other Topics Concern  . Not on file  Social History Narrative  . Not on file   Social Determinants of Health   Financial Resource Strain: Not on file  Food Insecurity: Not on file  Transportation Needs: Not on file  Physical Activity: Not on file  Stress: Not on file  Social Connections: Not on file  Intimate Partner Violence: Not on file    Outpatient Medications Prior to Visit  Medication Sig Dispense Refill  . atorvastatin (LIPITOR) 40 MG tablet Take 40 mg by mouth every morning.    Marland Kitchen FLUoxetine (PROZAC) 10 MG tablet Take 30 mg by mouth daily.    . hydrochlorothiazide (HYDRODIURIL) 25 MG tablet Take 25 mg by mouth daily.     Marland Kitchen ibuprofen (ADVIL,MOTRIN) 800 MG tablet Take 800 mg by mouth every 8 (eight) hours as needed for moderate pain. For pain    . LORazepam (ATIVAN) 1 MG tablet Take 1  mg by mouth 3 (three) times daily.    Marland Kitchen losartan (COZAAR) 50 MG tablet Take 50-100 mg by mouth daily.    . metFORMIN (GLUCOPHAGE-XR) 500 MG 24 hr tablet Take 500 mg by mouth daily with breakfast.     No facility-administered medications prior to visit.    Allergies  Allergen Reactions  . Neosporin + Pain Relief Max St [Neomy-Bacit-Polymyx-Pramoxine] Rash    ROS Review of Systems  Constitutional: Negative.   Respiratory: Negative.   Cardiovascular: Negative.   Endocrine: Negative.   Musculoskeletal: Positive for arthralgias, back pain and neck pain.      Objective:    Physical Exam Constitutional:      Appearance: He is obese.  Cardiovascular:     Rate and Rhythm: Normal rate and regular rhythm.     Pulses: Normal pulses.     Heart sounds: Normal heart sounds.  Pulmonary:     Effort: Pulmonary effort is  normal.     Breath sounds: Normal breath sounds.  Musculoskeletal:        General: No swelling, tenderness, deformity or signs of injury.  Neurological:     Mental Status: He is alert.  Psychiatric:        Mood and Affect: Mood normal.        Behavior: Behavior normal.        Thought Content: Thought content normal.        Judgment: Judgment normal.     BP 137/87   Pulse 100   Temp 97.9 F (36.6 C)   Resp 18   Ht 5\' 6"  (1.676 m)   Wt 200 lb (90.7 kg)   SpO2 95%   BMI 32.28 kg/m  Wt Readings from Last 3 Encounters:  07/15/20 200 lb (90.7 kg)  07/01/20 203 lb (92.1 kg)  08/27/18 200 lb (90.7 kg)     Health Maintenance Due  Topic Date Due  . PNEUMOCOCCAL POLYSACCHARIDE VACCINE AGE 56-64 HIGH RISK  Never done  . FOOT EXAM  Never done  . OPHTHALMOLOGY EXAM  Never done  . COVID-19 Vaccine (1) Never done  . TETANUS/TDAP  Never done  . COLONOSCOPY (Pts 45-9yrs Insurance coverage will need to be confirmed)  Never done  . INFLUENZA VACCINE  Never done    There are no preventive care reminders to display for this patient.  No results found for: TSH Lab Results  Component Value Date   WBC 9.2 07/10/2020   HGB 14.9 07/10/2020   HCT 44.4 07/10/2020   MCV 98 (H) 07/10/2020   PLT 149 (L) 07/10/2020   Lab Results  Component Value Date   NA 137 07/10/2020   K 3.8 07/10/2020   CO2 23 07/10/2020   GLUCOSE 266 (H) 07/10/2020   BUN 17 07/10/2020   CREATININE 1.13 07/10/2020   BILITOT 0.7 07/10/2020   ALKPHOS 190 (H) 07/10/2020   AST 78 (H) 07/10/2020   ALT 73 (H) 07/10/2020   PROT 7.3 07/10/2020   ALBUMIN 4.3 07/10/2020   CALCIUM 10.7 (H) 07/10/2020   ANIONGAP 11 09/11/2017   Lab Results  Component Value Date   CHOL 118 07/10/2020   Lab Results  Component Value Date   HDL 46 07/10/2020   Lab Results  Component Value Date   LDLCALC 46 07/10/2020   Lab Results  Component Value Date   TRIG 150 (H) 07/10/2020   No results found for: [facility] Lab Results   Component Value Date   HGBA1C 7.2 (H) 07/10/2020  Assessment & Plan:   Problem List Items Addressed This Visit      Endocrine   Type II diabetes mellitus, uncontrolled ([facility])    Lab Results  Component Value Date   HGBA1C 7.2 (H) 07/10/2020   -goal A1c < 7, but this is much improved for him -continue novolog per sliding scale -continue faxiga 10 mg daily -continue metformin BID -on ARB and statin -continue lantus 22 units at bedtime        Other   Hyperlipidemia    Lab Results  Component Value Date   CHOL 118 07/10/2020   HDL 46 07/10/2020   LDLCALC 46 07/10/2020   TRIG 150 (H) 07/10/2020   His LDL looks great, goal < 70       Polyarthralgia    -autoimmune panel is negative -referred to ortho -tylenol limited d/t elevated liver enzymes in setting of EtOH abuse -affects hands, shoulder, knees, and hips; hands are worse today      Hypercalcemia - Primary    Lab Results  Component Value Date   CALCIUM 10.7 (H) 07/10/2020    -calcium elevates; albumin normal -will check ionized calcium, vit D and PTH -if those are WNL, will consider changing HCTZ, then maybe oncology consult if med change does not help      Relevant Orders   Vitamin D (25 hydroxy)   PTH, Intact and Calcium    Other Visit Diagnoses    Polyarthritis       Relevant Orders   Ambulatory referral to Orthopedic Surgery      No orders of the defined types were placed in this encounter.   Follow-up: Return in about 3 months (around 10/13/2020) for lab follow-up.    Noreene Larsson, NP

## 2020-07-15 NOTE — Assessment & Plan Note (Signed)
Lab Results  Component Value Date   CHOL 118 07/10/2020   HDL 46 07/10/2020   LDLCALC 46 07/10/2020   TRIG 150 (H) 07/10/2020   His LDL looks great, goal < 70

## 2020-07-16 LAB — PTH, INTACT AND CALCIUM
Calcium: 10.1 mg/dL (ref 8.7–10.2)
PTH: 16 pg/mL (ref 15–65)

## 2020-07-16 LAB — VITAMIN D 25 HYDROXY (VIT D DEFICIENCY, FRACTURES): Vit D, 25-Hydroxy: 71.6 ng/mL (ref 30.0–100.0)

## 2020-07-16 NOTE — Progress Notes (Signed)
The labs we did yesterday showed that your calcium is back in the normal range. No need to change meds.

## 2020-08-06 ENCOUNTER — Telehealth: Payer: Self-pay

## 2020-08-06 ENCOUNTER — Other Ambulatory Visit: Payer: Self-pay | Admitting: Nurse Practitioner

## 2020-08-06 MED ORDER — LORAZEPAM 1 MG PO TABS: 1.0000 mg | ORAL_TABLET | Freq: Three times a day (TID) | ORAL | 2 refills | Status: DC

## 2020-08-06 NOTE — Telephone Encounter (Signed)
Sent!

## 2020-08-06 NOTE — Telephone Encounter (Signed)
Patient came in and stated [facility] said his lorazepam was denied can you reach out to the patient [phone]

## 2020-08-06 NOTE — Telephone Encounter (Signed)
Can you send this in, if it came through the rx request the nurses cannot approve.

## 2020-08-13 ENCOUNTER — Encounter: Payer: Self-pay | Admitting: Orthopaedic Surgery

## 2020-08-25 ENCOUNTER — Telehealth: Payer: Self-pay

## 2020-08-25 NOTE — Telephone Encounter (Signed)
FMLA forms received  Copied Noted Sleeved

## 2020-09-01 ENCOUNTER — Other Ambulatory Visit: Payer: Self-pay

## 2020-09-01 ENCOUNTER — Telehealth: Payer: Self-pay

## 2020-09-01 DIAGNOSIS — I10 Essential (primary) hypertension: Secondary | ICD-10-CM

## 2020-09-01 MED ORDER — HYDROCHLOROTHIAZIDE 25 MG PO TABS: 25.0000 mg | ORAL_TABLET | Freq: Every day | ORAL | 2 refills | Status: DC

## 2020-09-01 NOTE — Telephone Encounter (Signed)
Med refill hydrochlorothiazide (HYDRODIURIL) 25 mg  Pharmacy: [place]

## 2020-09-01 NOTE — Telephone Encounter (Signed)
Rx refilled.

## 2020-09-02 ENCOUNTER — Other Ambulatory Visit: Payer: Self-pay

## 2020-09-02 DIAGNOSIS — I10 Essential (primary) hypertension: Secondary | ICD-10-CM

## 2020-09-02 MED ORDER — LOSARTAN POTASSIUM 50 MG PO TABS: 50.0000 mg | ORAL_TABLET | Freq: Every day | ORAL | 2 refills | Status: DC

## 2020-09-04 DIAGNOSIS — Z8489 Family history of other specified conditions: Secondary | ICD-10-CM

## 2020-09-05 NOTE — Telephone Encounter (Signed)
Complete lvm letting patient know

## 2020-09-29 ENCOUNTER — Other Ambulatory Visit: Payer: Self-pay

## 2020-09-29 ENCOUNTER — Telehealth: Payer: Self-pay

## 2020-09-29 DIAGNOSIS — M2550 Pain in unspecified joint: Secondary | ICD-10-CM

## 2020-09-29 MED ORDER — IBUPROFEN 800 MG PO TABS: 800.0000 mg | ORAL_TABLET | Freq: Three times a day (TID) | ORAL | 0 refills | Status: DC | PRN

## 2020-09-29 NOTE — Telephone Encounter (Signed)
Rx sent in

## 2020-09-29 NOTE — Telephone Encounter (Signed)
Patient walkin need med refill  ibuprofen (ADVIL,MOTRIN) 800 MG tablet   Pharmacy: [place]

## 2020-09-30 ENCOUNTER — Telehealth: Payer: Self-pay

## 2020-09-30 ENCOUNTER — Other Ambulatory Visit: Payer: Self-pay

## 2020-09-30 DIAGNOSIS — F419 Anxiety disorder, unspecified: Secondary | ICD-10-CM

## 2020-09-30 MED ORDER — FLUOXETINE HCL 10 MG PO TABS: 30.0000 mg | ORAL_TABLET | Freq: Every day | ORAL | 2 refills | Status: DC

## 2020-09-30 NOTE — Telephone Encounter (Signed)
Patient  called needs med refill:  FLUoxetine (PROZAC) 10 MG tablet  Pharmacy: [facility]

## 2020-09-30 NOTE — Telephone Encounter (Signed)
Rx refilled.

## 2020-10-15 ENCOUNTER — Ambulatory Visit: Payer: BC Managed Care – PPO | Admitting: Nurse Practitioner

## 2020-10-15 ENCOUNTER — Other Ambulatory Visit: Payer: Self-pay

## 2020-10-15 ENCOUNTER — Encounter: Payer: Self-pay | Admitting: Nurse Practitioner

## 2020-10-15 DIAGNOSIS — F1020 Alcohol dependence, uncomplicated: Secondary | ICD-10-CM

## 2020-10-15 DIAGNOSIS — E1165 Type 2 diabetes mellitus with hyperglycemia: Secondary | ICD-10-CM

## 2020-10-15 DIAGNOSIS — F419 Anxiety disorder, unspecified: Secondary | ICD-10-CM

## 2020-10-15 DIAGNOSIS — E785 Hyperlipidemia, unspecified: Secondary | ICD-10-CM | POA: Diagnosis not present

## 2020-10-15 DIAGNOSIS — I10 Essential (primary) hypertension: Secondary | ICD-10-CM

## 2020-10-15 DIAGNOSIS — M2550 Pain in unspecified joint: Secondary | ICD-10-CM

## 2020-10-15 MED ORDER — LOSARTAN POTASSIUM 50 MG PO TABS: 50.0000 mg | ORAL_TABLET | Freq: Every day | ORAL | 1 refills | Status: DC

## 2020-10-15 MED ORDER — DAPAGLIFLOZIN PROPANEDIOL 10 MG PO TABS: 10.0000 mg | ORAL_TABLET | Freq: Every day | ORAL | 1 refills | Status: DC

## 2020-10-15 MED ORDER — ATORVASTATIN CALCIUM 40 MG PO TABS: 40.0000 mg | ORAL_TABLET | Freq: Every morning | ORAL | 1 refills | Status: DC

## 2020-10-15 MED ORDER — HYDROCHLOROTHIAZIDE 25 MG PO TABS: 25.0000 mg | ORAL_TABLET | Freq: Every day | ORAL | 1 refills | Status: DC

## 2020-10-15 MED ORDER — LANTUS SOLOSTAR 100 UNIT/ML ~~LOC~~ SOPN: 22.0000 [IU] | PEN_INJECTOR | Freq: Every day | SUBCUTANEOUS | 3 refills | Status: DC

## 2020-10-15 MED ORDER — PEN NEEDLES 32G X 4 MM MISC: 1.0000 | Freq: Four times a day (QID) | 11 refills | Status: DC

## 2020-10-15 MED ORDER — IBUPROFEN 800 MG PO TABS: 800.0000 mg | ORAL_TABLET | Freq: Three times a day (TID) | ORAL | 0 refills | Status: DC | PRN

## 2020-10-15 MED ORDER — INSULIN LISPRO (1 UNIT DIAL) 100 UNIT/ML (KWIKPEN): PEN_INJECTOR | SUBCUTANEOUS | 3 refills | Status: DC

## 2020-10-15 MED ORDER — FLUOXETINE HCL 10 MG PO TABS: 30.0000 mg | ORAL_TABLET | Freq: Every day | ORAL | 1 refills | Status: DC

## 2020-10-15 NOTE — Progress Notes (Signed)
Acute Office Visit  Subjective:    Patient ID: Shawn Velazquez, male    DOB: 07-16-1965, 55 y.o.   MRN: [id number]  Chief Complaint  Patient presents with  . Anxiety    Follow up    HPI Patient is in today for lab follow-up.  He has hx of DM, and last A1c was 7.2/  He takes novolog per sliding scale as well as farxiga and BID metformin.  He takes 22 units of lantus at bedtime.  His blood sugar has been good in the AM and at lunch, and he may just need insulin at dinner.  At his last visit, he had polyarthralgia and was referred to ortho after having a negative autoimmune panel. There have been no ortho visits, and his referral was denied d/t unable to contact.  He states that he was scared to go to the ortho doc, and decided to just deal with the pain.  He had hypercalcemia previously, but that resolved at the time of his last labs.  He states he is still drinking.  Past Medical History:  Diagnosis Date  . Anxiety   . Arthritis    affects hands, shoulder, neck, knees, hips, ankles, toes  . Chronic back pain   . Diabetes mellitus    diet controlled  . Eczema   . Fatty liver, alcoholic   . GERD (gastroesophageal reflux disease)   . Glaucoma    slight case  . Hypertension   . Psoriasis   . Sleep apnea   . Substance abuse ([place])   . Vertigo 09/11/2017  . Wears glasses     Past Surgical History:  Procedure Laterality Date  . MULTIPLE TOOTH EXTRACTIONS    . POSTERIOR CERVICAL LAMINECTOMY  04/21/2012   Procedure: POSTERIOR CERVICAL LAMINECTOMY;  Surgeon: Elaina Hoops, MD;  Location: [facility];  Service: Neurosurgery;  Laterality: Left;  Posterior Cervical laminectomy/foraminotomy and diskectomy, left cervical seven-thoracic one  . ROTATOR CUFF REPAIR    . THORACIC DISCECTOMY Left 04/03/2014   Procedure: Left Thoracic Seven to Eight, Thoracic Eight to Nine Thoracic Discectomy ;  Surgeon: Elaina Hoops, MD;  Location: [facility];  Service: Neurosurgery;  Laterality:  Left;    Family History  Problem Relation Age of Onset  . Diabetes Father   . Cancer - Lung Father   . Hypertension Other   . Cancer - Lung Other     Social History   Socioeconomic History  . Marital status: Divorced    Spouse name: Not on file  . Number of children: Not on file  . Years of education: Not on file  . Highest education level: Not on file  Occupational History    Comment: Truck to storage  Tobacco Use  . Smoking status: Current Some Day Smoker    Packs/day: 0.25    Years: 40.00    Pack years: 10.00    Types: E-cigarettes, Cigarettes  . Smokeless tobacco: Never Used  . Tobacco comment: pt trying to quit on his own  Vaping Use  . Vaping Use: Former  Substance and Sexual Activity  . Alcohol use: Yes    Comment: Either Smirnoff Smash-4 per day or Fireball-10 airplane bottles in a day  . Drug use: No  . Sexual activity: Yes    Birth control/protection: None  Other Topics Concern  . Not on file  Social History Narrative  . Not on file   Social Determinants of Health   Financial Resource Strain:  Not on file  Food Insecurity: Not on file  Transportation Needs: Not on file  Physical Activity: Not on file  Stress: Not on file  Social Connections: Not on file  Intimate Partner Violence: Not on file    Outpatient Medications Prior to Visit  Medication Sig Dispense Refill  . LORazepam (ATIVAN) 1 MG tablet Take 1 tablet (1 mg total) by mouth 3 (three) times daily. 90 tablet 2  . metFORMIN (GLUCOPHAGE-XR) 500 MG 24 hr tablet Take 500 mg by mouth daily with breakfast.    . atorvastatin (LIPITOR) 40 MG tablet Take 40 mg by mouth every morning.    Marland Kitchen FLUoxetine (PROZAC) 10 MG tablet Take 3 tablets (30 mg total) by mouth daily. 30 tablet 2  . hydrochlorothiazide (HYDRODIURIL) 25 MG tablet Take 1 tablet (25 mg total) by mouth daily. 30 tablet 2  . ibuprofen (ADVIL) 800 MG tablet Take 1 tablet (800 mg total) by mouth every 8 (eight) hours as needed for moderate  pain. For pain 30 tablet 0  . losartan (COZAAR) 50 MG tablet Take 1-2 tablets (50-100 mg total) by mouth daily. 30 tablet 2   No facility-administered medications prior to visit.    Allergies  Allergen Reactions  . Neosporin + Pain Relief Max St [Neomy-Bacit-Polymyx-Pramoxine] Rash    Review of Systems  Constitutional: Negative.   Respiratory: Negative.   Cardiovascular: Negative.   Endocrine: Negative.   Psychiatric/Behavioral: Negative for self-injury and suicidal ideas. The patient is nervous/anxious.        Objective:    Physical Exam Constitutional:      Appearance: Normal appearance.  Cardiovascular:     Rate and Rhythm: Normal rate and regular rhythm.     Pulses: Normal pulses.     Heart sounds: Normal heart sounds.  Pulmonary:     Effort: Pulmonary effort is normal.     Breath sounds: Normal breath sounds.  Musculoskeletal:        General: Normal range of motion.  Neurological:     Mental Status: He is alert.  Psychiatric:        Mood and Affect: Mood normal.        Behavior: Behavior normal.        Thought Content: Thought content normal.        Judgment: Judgment normal.     BP 104/70   Pulse 90   Temp 98.9 F (37.2 C)   Resp 20   Ht 5' 6"  (1.676 m)   Wt 194 lb (88 kg)   SpO2 90%   BMI 31.31 kg/m  Wt Readings from Last 3 Encounters:  10/15/20 194 lb (88 kg)  07/15/20 200 lb (90.7 kg)  07/01/20 203 lb (92.1 kg)    Health Maintenance Due  Topic Date Due  . PNEUMOCOCCAL POLYSACCHARIDE VACCINE AGE 31-64 HIGH RISK  Never done  . COVID-19 Vaccine (1) Never done  . FOOT EXAM  Never done  . OPHTHALMOLOGY EXAM  Never done  . TETANUS/TDAP  Never done  . COLONOSCOPY (Pts 45-87yr Insurance coverage will need to be confirmed)  Never done    There are no preventive care reminders to display for this patient.   No results found for: TSH Lab Results  Component Value Date   WBC 9.2 07/10/2020   HGB 14.9 07/10/2020   HCT 44.4 07/10/2020   MCV 98  (H) 07/10/2020   PLT 149 (L) 07/10/2020   Lab Results  Component Value Date   NA 137 07/10/2020  K 3.8 07/10/2020   CO2 23 07/10/2020   GLUCOSE 266 (H) 07/10/2020   BUN 17 07/10/2020   CREATININE 1.13 07/10/2020   BILITOT 0.7 07/10/2020   ALKPHOS 190 (H) 07/10/2020   AST 78 (H) 07/10/2020   ALT 73 (H) 07/10/2020   PROT 7.3 07/10/2020   ALBUMIN 4.3 07/10/2020   CALCIUM 10.1 07/15/2020   ANIONGAP 11 09/11/2017   Lab Results  Component Value Date   CHOL 118 07/10/2020   Lab Results  Component Value Date   HDL 46 07/10/2020   Lab Results  Component Value Date   LDLCALC 46 07/10/2020   Lab Results  Component Value Date   TRIG 150 (H) 07/10/2020   No results found for: CHOLHDL Lab Results  Component Value Date   HGBA1C 7.2 (H) 07/10/2020       Assessment & Plan:   Problem List Items Addressed This Visit      Cardiovascular and Mediastinum   Hypertension, essential    -taking losartan and HCTZ      Relevant Medications   atorvastatin (LIPITOR) 40 MG tablet   hydrochlorothiazide (HYDRODIURIL) 25 MG tablet   losartan (COZAAR) 50 MG tablet   Other Relevant Orders   CBC with Differential/Platelet   CMP14+EGFR   Lipid Panel With LDL/HDL Ratio     Endocrine   Type II diabetes mellitus, uncontrolled (HCC)    -will check A1c today -he uses novolog per sliding scale, but we need a copy of this -on ARB and statin      Relevant Medications   insulin glargine (LANTUS SOLOSTAR) 100 UNIT/ML Solostar Pen   insulin lispro (HUMALOG KWIKPEN) 100 UNIT/ML KwikPen   atorvastatin (LIPITOR) 40 MG tablet   losartan (COZAAR) 50 MG tablet   Other Relevant Orders   Hemoglobin A1c   Microalbumin / creatinine urine ratio     Other   Anxiety   Relevant Medications   FLUoxetine (PROZAC) 10 MG tablet   Alcohol use disorder, moderate, dependence (HCC)    -still drinking -was referred to psych, no evidence that he has gone to psych      Hyperlipidemia    -ordered  labs -taking atorvastatin      Relevant Medications   atorvastatin (LIPITOR) 40 MG tablet   hydrochlorothiazide (HYDRODIURIL) 25 MG tablet   losartan (COZAAR) 50 MG tablet   Other Relevant Orders   Lipid Panel With LDL/HDL Ratio   Polyarthralgia    -he did not set up ortho appointment -we discussed that if his pain is worse, we can set him up a new appt      Relevant Medications   ibuprofen (ADVIL) 800 MG tablet       Meds ordered this encounter  Medications  . insulin glargine (LANTUS SOLOSTAR) 100 UNIT/ML Solostar Pen    Sig: Inject 22 Units into the skin daily.    Dispense:  30 mL    Refill:  3  . insulin lispro (HUMALOG KWIKPEN) 100 UNIT/ML KwikPen    Sig: Custom Sliding Scale for use with meals. For blood sugar 70-139, take 0 units For blood sugar 140-180, take 6 units For blood sugar 181-240, take 8 units For blood sugar 241-300, take 10 units For blood sugar 301-350, take 12 units For blood sugar 351-400, take 14 units. For blood sugar > 400, take 16 units and call MD.    Dispense:  15 mL    Refill:  3  . Insulin Pen Needle (PEN NEEDLES) 32G X 4 MM  MISC    Sig: 1 each by Does not apply route in the morning, at noon, in the evening, and at bedtime.    Dispense:  100 each    Refill:  11  . atorvastatin (LIPITOR) 40 MG tablet    Sig: Take 1 tablet (40 mg total) by mouth every morning.    Dispense:  90 tablet    Refill:  1  . FLUoxetine (PROZAC) 10 MG tablet    Sig: Take 3 tablets (30 mg total) by mouth daily.    Dispense:  270 tablet    Refill:  1  . hydrochlorothiazide (HYDRODIURIL) 25 MG tablet    Sig: Take 1 tablet (25 mg total) by mouth daily.    Dispense:  90 tablet    Refill:  1  . ibuprofen (ADVIL) 800 MG tablet    Sig: Take 1 tablet (800 mg total) by mouth every 8 (eight) hours as needed for moderate pain. For pain    Dispense:  270 tablet    Refill:  0  . losartan (COZAAR) 50 MG tablet    Sig: Take 1 tablet (50 mg total) by mouth daily.    Dispense:   90 tablet    Refill:  [place], NP

## 2020-10-15 NOTE — Addendum Note (Signed)
Addended by: Demetrius Revel on: 10/15/2020 08:42 AM   Modules accepted: Orders

## 2020-10-15 NOTE — Patient Instructions (Signed)
Fasting labs today

## 2020-10-15 NOTE — Assessment & Plan Note (Signed)
-  ordered labs -taking atorvastatin

## 2020-10-15 NOTE — Assessment & Plan Note (Addendum)
-  will check A1c today -he uses novolog per sliding scale, but we need a copy of this -on ARB and statin

## 2020-10-15 NOTE — Assessment & Plan Note (Signed)
-  he did not set up ortho appointment -we discussed that if his pain is worse, we can set him up a new appt

## 2020-10-15 NOTE — Assessment & Plan Note (Signed)
-  taking losartan and HCTZ

## 2020-10-15 NOTE — Assessment & Plan Note (Signed)
-  still drinking -was referred to psych, no evidence that he has gone to psych

## 2020-10-16 ENCOUNTER — Other Ambulatory Visit: Payer: Self-pay | Admitting: Nurse Practitioner

## 2020-10-16 DIAGNOSIS — E1165 Type 2 diabetes mellitus with hyperglycemia: Secondary | ICD-10-CM

## 2020-10-16 LAB — CMP14+EGFR
Albumin/Globulin Ratio: 1.4 (ref 1.2–2.2)
Glucose: 120 mg/dL — ABNORMAL HIGH (ref 65–99)

## 2020-10-16 LAB — CBC WITH DIFFERENTIAL/PLATELET
Basos: 1 %
Platelets: 166 10*3/uL (ref 150–450)

## 2020-10-16 NOTE — Progress Notes (Signed)
A1c has increased to 9.1 from 7.2.  Liver function tests are still elevated, and that is likely from alcohol.  I'm going to refer you to endocrinology, because they have a diabetes educator that can help you make better choices with your diet.

## 2020-10-17 LAB — CMP14+EGFR
ALT: 75 IU/L — ABNORMAL HIGH (ref 0–44)
AST: 97 IU/L — ABNORMAL HIGH (ref 0–40)
Albumin: 4.6 g/dL (ref 3.8–4.9)
Alkaline Phosphatase: 195 IU/L — ABNORMAL HIGH (ref 44–121)
BUN/Creatinine Ratio: 11 (ref 9–20)
BUN: 14 mg/dL (ref 6–24)
Bilirubin Total: 1.1 mg/dL (ref 0.0–1.2)
CO2: 21 mmol/L (ref 20–29)
Calcium: 10.1 mg/dL (ref 8.7–10.2)
Chloride: 98 mmol/L (ref 96–106)
Creatinine, Ser: 1.22 mg/dL (ref 0.76–1.27)
Globulin, Total: 3.3 g/dL (ref 1.5–4.5)
Potassium: 3.6 mmol/L (ref 3.5–5.2)
Sodium: 141 mmol/L (ref 134–144)
Total Protein: 7.9 g/dL (ref 6.0–8.5)
eGFR: 70 mL/min/{1.73_m2} (ref >59)

## 2020-10-17 LAB — LIPID PANEL WITH LDL/HDL RATIO
Cholesterol, Total: 125 mg/dL (ref 100–199)
HDL: 44 mg/dL (ref >39)
LDL Chol Calc (NIH): 56 mg/dL (ref 0–99)
LDL/HDL Ratio: 1.3 ratio (ref 0.0–3.6)
Triglycerides: 142 mg/dL (ref 0–149)
VLDL Cholesterol Cal: 25 mg/dL (ref 5–40)

## 2020-10-17 LAB — CBC WITH DIFFERENTIAL/PLATELET
Basophils Absolute: 0.1 10*3/uL (ref 0.0–0.2)
EOS (ABSOLUTE): 0.3 10*3/uL (ref 0.0–0.4)
Eos: 5 %
Hematocrit: 44.8 % (ref 37.5–51.0)
Hemoglobin: 15 g/dL (ref 13.0–17.7)
Immature Grans (Abs): 0 10*3/uL (ref 0.0–0.1)
Immature Granulocytes: 0 %
Lymphocytes Absolute: 2.1 10*3/uL (ref 0.7–3.1)
Lymphs: 29 %
MCH: 32.5 pg (ref 26.6–33.0)
MCHC: 33.5 g/dL (ref 31.5–35.7)
MCV: 97 fL (ref 79–97)
Monocytes Absolute: 0.7 10*3/uL (ref 0.1–0.9)
Monocytes: 9 %
Neutrophils Absolute: 4 10*3/uL (ref 1.4–7.0)
Neutrophils: 56 %
RBC: 4.62 x10E6/uL (ref 4.14–5.80)
RDW: 13 % (ref 11.6–15.4)
WBC: 7.2 10*3/uL (ref 3.4–10.8)

## 2020-10-17 LAB — HEMOGLOBIN A1C
Est. average glucose Bld gHb Est-mCnc: 214 mg/dL
Hgb A1c MFr Bld: 9.1 % — ABNORMAL HIGH (ref 4.8–5.6)

## 2020-10-17 LAB — MICROALBUMIN / CREATININE URINE RATIO
Creatinine, Urine: 74.5 mg/dL
Microalb/Creat Ratio: 13 mg/g creat (ref 0–29)
Microalbumin, Urine: 9.7 ug/mL

## 2020-10-29 ENCOUNTER — Encounter: Payer: Self-pay | Admitting: Nurse Practitioner

## 2020-10-29 ENCOUNTER — Ambulatory Visit: Payer: BC Managed Care – PPO | Admitting: Nurse Practitioner

## 2020-10-29 ENCOUNTER — Other Ambulatory Visit: Payer: Self-pay

## 2020-10-29 VITALS — BP 155/98 | HR 82 | Ht 66.0 in | Wt 194.8 lb

## 2020-10-29 DIAGNOSIS — E782 Mixed hyperlipidemia: Secondary | ICD-10-CM | POA: Diagnosis not present

## 2020-10-29 DIAGNOSIS — E1165 Type 2 diabetes mellitus with hyperglycemia: Secondary | ICD-10-CM | POA: Diagnosis not present

## 2020-10-29 DIAGNOSIS — I10 Essential (primary) hypertension: Secondary | ICD-10-CM | POA: Diagnosis not present

## 2020-10-29 MED ORDER — INSULIN LISPRO (1 UNIT DIAL) 100 UNIT/ML (KWIKPEN): 5.0000 [IU] | PEN_INJECTOR | Freq: Three times a day (TID) | SUBCUTANEOUS | 3 refills | Status: DC

## 2020-10-29 NOTE — Patient Instructions (Signed)
Diabetes Mellitus and Nutrition, Adult When you have diabetes, or diabetes mellitus, it is very important to have healthy eating habits because your blood sugar (glucose) levels are greatly affected by what you eat and drink. Eating healthy foods in the right amounts, at about the same times every day, can help you:  Control your blood glucose.  Lower your risk of heart disease.  Improve your blood pressure.  Reach or maintain a healthy weight. What can affect my meal plan? Every person with diabetes is different, and each person has different needs for a meal plan. Your health care provider may recommend that you work with a dietitian to make a meal plan that is best for you. Your meal plan may vary depending on factors such as:  The calories you need.  The medicines you take.  Your weight.  Your blood glucose, blood pressure, and cholesterol levels.  Your activity level.  Other health conditions you have, such as heart or kidney disease. How do carbohydrates affect me? Carbohydrates, also called carbs, affect your blood glucose level more than any other type of food. Eating carbs naturally raises the amount of glucose in your blood. Carb counting is a method for keeping track of how many carbs you eat. Counting carbs is important to keep your blood glucose at a healthy level, especially if you use insulin or take certain oral diabetes medicines. It is important to know how many carbs you can safely have in each meal. This is different for every person. Your dietitian can help you calculate how many carbs you should have at each meal and for each snack. How does alcohol affect me? Alcohol can cause a sudden decrease in blood glucose (hypoglycemia), especially if you use insulin or take certain oral diabetes medicines. Hypoglycemia can be a life-threatening condition. Symptoms of hypoglycemia, such as sleepiness, dizziness, and confusion, are similar to symptoms of having too much  alcohol.  Do not drink alcohol if: ? Your health care provider tells you not to drink. ? You are pregnant, may be pregnant, or are planning to become pregnant.  If you drink alcohol: ? Do not drink on an empty stomach. ? Limit how much you use to:  0-1 drink a day for women.  0-2 drinks a day for men. ? Be aware of how much alcohol is in your drink. In the [place] one drink equals one 12 oz bottle of beer (355 mL), one 5 oz glass of wine (148 mL), or one 1 oz glass of hard liquor (44 mL). ? Keep yourself hydrated with water, diet soda, or unsweetened iced tea.  Keep in mind that regular soda, juice, and other mixers may contain a lot of sugar and must be counted as carbs. What are tips for following this plan? Reading food labels  Start by checking the serving size on the "Nutrition Facts" label of packaged foods and drinks. The amount of calories, carbs, fats, and other nutrients listed on the label is based on one serving of the item. Many items contain more than one serving per package.  Check the total grams (g) of carbs in one serving. You can calculate the number of servings of carbs in one serving by dividing the total carbs by 15. For example, if a food has 30 g of total carbs per serving, it would be equal to 2 servings of carbs.  Check the number of grams (g) of saturated fats and trans fats in one serving. Choose foods that have   a low amount or none of these fats.  Check the number of milligrams (mg) of salt (sodium) in one serving. Most people should limit total sodium intake to less than 2,300 mg per day.  Always check the nutrition information of foods labeled as "low-fat" or "nonfat." These foods may be higher in added sugar or refined carbs and should be avoided.  Talk to your dietitian to identify your daily goals for nutrients listed on the label. Shopping  Avoid buying canned, pre-made, or processed foods. These foods tend to be high in fat, sodium, and added  sugar.  Shop around the outside edge of the grocery store. This is where you will most often find fresh fruits and vegetables, bulk grains, fresh meats, and fresh dairy. Cooking  Use low-heat cooking methods, such as baking, instead of high-heat cooking methods like deep frying.  Cook using healthy oils, such as olive, canola, or sunflower oil.  Avoid cooking with butter, cream, or high-fat meats. Meal planning  Eat meals and snacks regularly, preferably at the same times every day. Avoid going long periods of time without eating.  Eat foods that are high in fiber, such as fresh fruits, vegetables, beans, and whole grains. Talk with your dietitian about how many servings of carbs you can eat at each meal.  Eat 4-6 oz (112-168 g) of lean protein each day, such as lean meat, chicken, fish, eggs, or tofu. One ounce (oz) of lean protein is equal to: ? 1 oz (28 g) of meat, chicken, or fish. ? 1 egg. ?  cup (62 g) of tofu.  Eat some foods each day that contain healthy fats, such as avocado, nuts, seeds, and fish.   What foods should I eat? Fruits Berries. Apples. Oranges. Peaches. Apricots. Plums. Grapes. Mango. Papaya. Pomegranate. Kiwi. Cherries. Vegetables Lettuce. Spinach. Leafy greens, including kale, chard, collard greens, and mustard greens. Beets. Cauliflower. Cabbage. Broccoli. Carrots. Green beans. Tomatoes. Peppers. Onions. Cucumbers. Brussels sprouts. Grains Whole grains, such as whole-wheat or whole-grain bread, crackers, tortillas, cereal, and pasta. Unsweetened oatmeal. Quinoa. Brown or wild rice. Meats and other proteins Seafood. Poultry without skin. Lean cuts of poultry and beef. Tofu. Nuts. Seeds. Dairy Low-fat or fat-free dairy products such as milk, yogurt, and cheese. The items listed above may not be a complete list of foods and beverages you can eat. Contact a dietitian for more information. What foods should I avoid? Fruits Fruits canned with  syrup. Vegetables Canned vegetables. Frozen vegetables with butter or cream sauce. Grains Refined white flour and flour products such as bread, pasta, snack foods, and cereals. Avoid all processed foods. Meats and other proteins Fatty cuts of meat. Poultry with skin. Breaded or fried meats. Processed meat. Avoid saturated fats. Dairy Full-fat yogurt, cheese, or milk. Beverages Sweetened drinks, such as soda or iced tea. The items listed above may not be a complete list of foods and beverages you should avoid. Contact a dietitian for more information. Questions to ask a health care provider  Do I need to meet with a diabetes educator?  Do I need to meet with a dietitian?  What number can I call if I have questions?  When are the best times to check my blood glucose? Where to find more information:  American Diabetes Association: diabetes.org  Academy of Nutrition and Dietetics: www.eatright.org  National Institute of Diabetes and Digestive and Kidney Diseases: www.niddk.nih.gov  Association of Diabetes Care and Education Specialists: www.diabeteseducator.org Summary  It is important to have healthy eating   habits because your blood sugar (glucose) levels are greatly affected by what you eat and drink.  A healthy meal plan will help you control your blood glucose and maintain a healthy lifestyle.  Your health care provider may recommend that you work with a dietitian to make a meal plan that is best for you.  Keep in mind that carbohydrates (carbs) and alcohol have immediate effects on your blood glucose levels. It is important to count carbs and to use alcohol carefully. This information is not intended to replace advice given to you by your health care provider. Make sure you discuss any questions you have with your health care provider. Document Revised: 05/29/2019 Document Reviewed: 05/29/2019 Elsevier Patient Education  2021 [employer]  

## 2020-10-29 NOTE — Progress Notes (Signed)
Endocrinology Consult Note       10/29/2020, 4:56 PM   Subjective:    Patient ID: Shawn Velazquez, male    DOB: 11-Apr-1966.  Shawn Velazquez is being seen in consultation for management of currently uncontrolled symptomatic diabetes requested by  Noreene Larsson, NP.   Past Medical History:  Diagnosis Date  . Anxiety   . Arthritis    affects hands, shoulder, neck, knees, hips, ankles, toes  . Chronic back pain   . Diabetes mellitus    diet controlled  . Eczema   . Fatty liver, alcoholic   . GERD (gastroesophageal reflux disease)   . Glaucoma    slight case  . Hypertension   . Psoriasis   . Sleep apnea   . Substance abuse ([place])   . Vertigo 09/11/2017  . Wears glasses     Past Surgical History:  Procedure Laterality Date  . MULTIPLE TOOTH EXTRACTIONS    . POSTERIOR CERVICAL LAMINECTOMY  04/21/2012   Procedure: POSTERIOR CERVICAL LAMINECTOMY;  Surgeon: Elaina Hoops, MD;  Location: [facility];  Service: Neurosurgery;  Laterality: Left;  Posterior Cervical laminectomy/foraminotomy and diskectomy, left cervical seven-thoracic one  . ROTATOR CUFF REPAIR    . THORACIC DISCECTOMY Left 04/03/2014   Procedure: Left Thoracic Seven to Eight, Thoracic Eight to Nine Thoracic Discectomy ;  Surgeon: Elaina Hoops, MD;  Location: [facility];  Service: Neurosurgery;  Laterality: Left;    Social History   Socioeconomic History  . Marital status: Divorced    Spouse name: Not on file  . Number of children: Not on file  . Years of education: Not on file  . Highest education level: Not on file  Occupational History    Comment: Truck to storage  Tobacco Use  . Smoking status: Current Some Day Smoker    Packs/day: 0.25    Years: 40.00    Pack years: 10.00    Types: E-cigarettes, Cigarettes  . Smokeless tobacco: Never Used  . Tobacco comment: pt trying to quit on his own  Vaping Use  . Vaping Use: Former   Substance and Sexual Activity  . Alcohol use: Yes    Comment: Either Smirnoff Smash-4 per day or Fireball-10 airplane bottles in a day  . Drug use: No  . Sexual activity: Yes    Birth control/protection: None  Other Topics Concern  . Not on file  Social History Narrative  . Not on file   Social Determinants of Health   Financial Resource Strain: Not on file  Food Insecurity: Not on file  Transportation Needs: Not on file  Physical Activity: Not on file  Stress: Not on file  Social Connections: Not on file    Family History  Problem Relation Age of Onset  . Diabetes Father   . Cancer - Lung Father   . Hypertension Other   . Cancer - Lung Other     Outpatient Encounter Medications as of 10/29/2020  Medication Sig  . atorvastatin (LIPITOR) 40 MG tablet Take 1 tablet (40 mg total) by mouth every morning.  Marland Kitchen FLUoxetine (PROZAC) 10 MG capsule Take 30 mg by mouth daily.  . hydrochlorothiazide (HYDRODIURIL)  25 MG tablet Take 1 tablet (25 mg total) by mouth daily.  Marland Kitchen ibuprofen (ADVIL) 800 MG tablet Take 1 tablet (800 mg total) by mouth every 8 (eight) hours as needed for moderate pain. For pain  . insulin glargine (LANTUS SOLOSTAR) 100 UNIT/ML Solostar Pen Inject 22 Units into the skin daily.  . Insulin Pen Needle (PEN NEEDLES) 32G X 4 MM MISC 1 each by Does not apply route in the morning, at noon, in the evening, and at bedtime.  Marland Kitchen LORazepam (ATIVAN) 1 MG tablet Take 1 tablet (1 mg total) by mouth 3 (three) times daily.  Marland Kitchen losartan (COZAAR) 50 MG tablet Take 1 tablet (50 mg total) by mouth daily.  . meclizine (ANTIVERT) 25 MG tablet Take 25 mg by mouth 3 (three) times daily as needed for dizziness.  . metFORMIN (GLUCOPHAGE-XR) 500 MG 24 hr tablet Take 500 mg by mouth daily with breakfast.  . tiZANidine (ZANAFLEX) 2 MG tablet SMARTSIG:1 Tablet(s) By Mouth 1 to 3 Times Daily PRN  . [DISCONTINUED] dapagliflozin propanediol (FARXIGA) 10 MG TABS tablet Take 1 tablet (10 mg total) by  mouth daily before breakfast.  . [DISCONTINUED] FLUoxetine (PROZAC) 10 MG tablet Take 3 tablets (30 mg total) by mouth daily.  . [DISCONTINUED] insulin lispro (HUMALOG KWIKPEN) 100 UNIT/ML KwikPen Custom Sliding Scale for use with meals. For blood sugar 70-139, take 0 units For blood sugar 140-180, take 6 units For blood sugar 181-240, take 8 units For blood sugar 241-300, take 10 units For blood sugar 301-350, take 12 units For blood sugar 351-400, take 14 units. For blood sugar > 400, take 16 units and call MD.  . insulin lispro (HUMALOG KWIKPEN) 100 UNIT/ML KwikPen Inject 5-11 Units into the skin 3 (three) times daily.  . [DISCONTINUED] insulin lispro (HUMALOG KWIKPEN) 100 UNIT/ML KwikPen Inject 5-11 Units into the skin 3 (three) times daily. Custom Sliding Scale for use with meals. For blood sugar 70-139, take 0 units For blood sugar 140-180, take 6 units For blood sugar 181-240, take 8 units For blood sugar 241-300, take 10 units For blood sugar 301-350, take 12 units For blood sugar 351-400, take 14 units. For blood sugar > 400, take 16 units and call MD.   No facility-administered encounter medications on file as of 10/29/2020.    ALLERGIES: Allergies  Allergen Reactions  . Neosporin + Pain Relief Max St [Neomy-Bacit-Polymyx-Pramoxine] Rash    VACCINATION STATUS:  There is no immunization history on file for this patient.  Diabetes He presents for his initial diabetic visit. He has type 2 diabetes mellitus. Onset time: was diagnosed at approx age of 59. His disease course has been worsening. There are no hypoglycemic associated symptoms. Associated symptoms include blurred vision, fatigue, polydipsia and polyuria. There are no hypoglycemic complications. Symptoms are stable. Diabetic complications include nephropathy. Risk factors for coronary artery disease include diabetes mellitus, dyslipidemia, hypertension, male sex, obesity and sedentary lifestyle. Current diabetic treatment includes  intensive insulin program and oral agent (monotherapy). He is compliant with treatment some of the time. His weight is fluctuating minimally. He is following a generally unhealthy diet. When asked about meal planning, he reported none. He has not had a previous visit with a dietitian. He rarely participates in exercise. (He presents today for his consultation, with no meter or logs to review.  His most recent A1c was 9.1% on 10/15/20.  He routinely monitors glucose twice daily.  He admits to drinking mostly water, some soda and alcohol quite heavily.  He reports he eats 1-2 meals per day with frequent snacks.  He does not engage in routine physical activity.  He does not report any episodes of hypoglycemia.) An ACE inhibitor/angiotensin II receptor blocker is being taken. He does not see a podiatrist.Eye exam is current.  Hyperlipidemia This is a chronic problem. The current episode started more than 1 year ago. The problem is controlled. Recent lipid tests were reviewed and are normal. Exacerbating diseases include chronic renal disease, diabetes and obesity. Factors aggravating his hyperlipidemia include thiazides and fatty foods. Current antihyperlipidemic treatment includes statins. The current treatment provides mild improvement of lipids. Compliance problems include adherence to diet, adherence to exercise and psychosocial issues.  Risk factors for coronary artery disease include diabetes mellitus, dyslipidemia, family history, hypertension, male sex, obesity and a sedentary lifestyle.  Hypertension This is a chronic problem. The current episode started more than 1 year ago. The problem is unchanged. The problem is uncontrolled. Associated symptoms include blurred vision. There are no associated agents to hypertension. Risk factors for coronary artery disease include diabetes mellitus, dyslipidemia, family history, male gender, obesity and sedentary lifestyle. Past treatments include diuretics and  angiotensin blockers. The current treatment provides mild improvement. Compliance problems include diet, exercise and psychosocial issues.  Hypertensive end-organ damage includes kidney disease. Identifiable causes of hypertension include chronic renal disease.     Review of systems  Constitutional: + Minimally fluctuating body weight,  current Body mass index is 31.44 kg/m. , + fatigue, no subjective hyperthermia, no subjective hypothermia Eyes: + blurry vision, no xerophthalmia ENT: no sore throat, no nodules palpated in throat, no dysphagia/odynophagia, no hoarseness Cardiovascular: no chest pain, no shortness of breath, no palpitations, no leg swelling Respiratory: no cough, no shortness of breath Gastrointestinal: no nausea/vomiting/diarrhea Musculoskeletal: no muscle/joint aches Skin: no rashes, no hyperemia Neurological: no tremors, no numbness, no tingling, no dizziness Psychiatric: no depression, no anxiety  Objective:     BP (!) 155/98 (BP Location: Left Arm, Patient Position: Sitting)   Pulse 82   Ht 5\' 6"  (1.676 m)   Wt 194 lb 12.8 oz (88.4 kg)   BMI 31.44 kg/m   Wt Readings from Last 3 Encounters:  10/29/20 194 lb 12.8 oz (88.4 kg)  10/15/20 194 lb (88 kg)  07/15/20 200 lb (90.7 kg)     BP Readings from Last 3 Encounters:  10/29/20 (!) 155/98  10/15/20 104/70  07/15/20 137/87     Physical Exam- Limited  Constitutional:  Body mass index is 31.44 kg/m. , not in acute distress, normal state of mind Eyes:  EOMI, no exophthalmos Neck: Supple Cardiovascular: RRR, no murmers, rubs, or gallops, no edema Respiratory: Adequate breathing efforts, no crackles, rales, rhonchi, or wheezing Musculoskeletal: no gross deformities, strength intact in all four extremities, no gross restriction of joint movements Skin:  no rashes, no hyperemia Neurological: no tremor with outstretched hands    CMP ( most recent) CMP     Component Value Date/Time   NA 141 10/15/2020  0846   K 3.6 10/15/2020 0846   CL 98 10/15/2020 0846   CO2 21 10/15/2020 0846   GLUCOSE 120 (H) 10/15/2020 0846   GLUCOSE 127 (H) 09/11/2017 2143   BUN 14 10/15/2020 0846   CREATININE 1.22 10/15/2020 0846   CALCIUM 10.1 10/15/2020 0846   PROT 7.9 10/15/2020 0846   ALBUMIN 4.6 10/15/2020 0846   AST 97 (H) 10/15/2020 0846   ALT 75 (H) 10/15/2020 0846   ALKPHOS 195 (H) 10/15/2020 0539  BILITOT 1.1 10/15/2020 0846   GFRNONAA 72 07/10/2020 1005   GFRAA 84 07/10/2020 1005     Diabetic Labs (most recent): Lab Results  Component Value Date   HGBA1C 9.1 (H) 10/15/2020   HGBA1C 7.2 (H) 07/10/2020   HGBA1C 7.1 (H) 04/19/2012     Lipid Panel ( most recent) Lipid Panel     Component Value Date/Time   CHOL 125 10/15/2020 0846   TRIG 142 10/15/2020 0846   HDL 44 10/15/2020 0846   LDLCALC 56 10/15/2020 0846   LABVLDL 25 10/15/2020 0846      No results found for: TSH, FREET4         Assessment & Plan:   1) Uncontrolled type 2 diabetes mellitus with hyperglycemia ([place])  He presents today for his consultation, with no meter or logs to review.  His most recent A1c was 9.1% on 10/15/20.  He routinely monitors glucose twice daily.  He admits to drinking mostly water, some soda and alcohol quite heavily.  He reports he eats 1-2 meals per day with frequent snacks.  He does not engage in routine physical activity.  He does not report any episodes of hypoglycemia.  - Shawn Velazquez has currently uncontrolled symptomatic type 2 DM since 55 years of age, with most recent A1c of 9.1 %.   -Recent labs reviewed.  - I had a long discussion with him about the progressive nature of diabetes and the pathology behind its complications. -his diabetes is complicated by chronic alcohol abuse, mild CKD and he remains at a high risk for more acute and chronic complications which include CAD, CVA, CKD, retinopathy, and neuropathy. These are all discussed in detail with him.  - I have counseled  him on diet  and weight management by adopting a carbohydrate restricted/protein rich diet. Patient is encouraged to switch to unprocessed or minimally processed complex starch and increased protein intake (animal or plant source), fruits, and vegetables. -  he is advised to stick to a routine mealtimes to eat 3 meals a day and avoid unnecessary snacks (to snack only to correct hypoglycemia).   - he acknowledges that there is a room for improvement in his food and drink choices. - Suggestion is made for him to avoid simple carbohydrates  from his diet including Cakes, Sweet Desserts, Ice Cream, Soda (diet and regular), Sweet Tea, Candies, Chips, Cookies, Store Bought Juices, Alcohol in Excess of  1-2 drinks a day, Artificial Sweeteners, Coffee Creamer, and "Sugar-free" Products. This will help patient to have more stable blood glucose profile and potentially avoid unintended weight gain.  - I have approached him with the following individualized plan to manage  his diabetes and patient agrees:   -He is advised to continue Lantus 22 units SQ nightly.  Will adjust his Humalog to 5-11 units SQ TID with meals if glucose is above 90 and he is eating.  Specific instructions on how to titrate insulin dose based on glucose readings given to patient in writing.  He demonstrated his ability to use the SSI scale correctly with me today.  -he is encouraged to start monitoring glucose 4 times daily, before meals and before bed, to log their readings on the clinic sheets provided, and bring them to review at follow up appointment in 2 weeks.  - he is warned not to take insulin without proper monitoring per orders. - Adjustment parameters are given to him for hypo and hyperglycemia in writing. - he is encouraged to call clinic for  blood glucose levels less than 70 or above 300 mg /dl. - he is advised to continue Metformin 500 mg ER daily with breakfast, therapeutically suitable for patient . - his Wilder Glade will be  discontinued, risk outweighs benefit for this patient.  - he will be considered for incretin therapy as appropriate next visit.  - Specific targets for  A1c;  LDL, HDL,  and Triglycerides were discussed with the patient.  2) Blood Pressure /Hypertension:  his blood pressure is not controlled to target.   he is advised to continue his current medications including Losartan 50 mg po daily and HCTZ 25 mg p.o. daily with breakfast.  3) Lipids/Hyperlipidemia:    Review of his recent lipid panel from 10/15/20 showed controlled  LDL at 56 .  he  is advised to continue Lipitor 40 mg daily at bedtime.  Side effects and precautions discussed with him.  4)  Weight/Diet:  his Body mass index is 31.44 kg/m.  -  clearly complicating his diabetes care.   he is a candidate for weight loss. I discussed with him the fact that loss of 5 - 10% of his  current body weight will have the most impact on his diabetes management.  Exercise, and detailed carbohydrates information provided  -  detailed on discharge instructions.  5) Chronic Care/Health Maintenance: -he is on ACEI/ARB and Statin medications and is encouraged to initiate and continue to follow up with Ophthalmology, Dentist, Podiatrist at least yearly or according to recommendations, and advised to stay away from smoking. I have recommended yearly flu vaccine and pneumonia vaccine at least every 5 years; moderate intensity exercise for up to 150 minutes weekly; and sleep for at least 7 hours a day.  - he is advised to maintain close follow up with Noreene Larsson, NP for primary care needs, as well as his other providers for optimal and coordinated care.   - Time spent in this patient care: 60 min, of which > 50% was spent in counseling him about his diabetes and the rest reviewing his blood glucose logs, discussing his hypoglycemia and hyperglycemia episodes, reviewing his current and previous labs/studies (including abstraction from other facilities) and  medications doses and developing a long term treatment plan based on the latest standards of care/guidelines; and documenting his care.    Please refer to Patient Instructions for Blood Glucose Monitoring and Insulin/Medications Dosing Guide" in media tab for additional information. Please also refer to "Patient Self Inventory" in the Media tab for reviewed elements of pertinent patient history.  Shirlean Mylar participated in the discussions, expressed understanding, and voiced agreement with the above plans.  All questions were answered to his satisfaction. he is encouraged to contact clinic should he have any questions or concerns prior to his return visit.   Follow up plan: - Return in about 2 weeks (around 11/12/2020) for Diabetes F/U, Bring meter and logs, ABI next visit.  Rayetta Pigg, [facility] [address] [place], [place] [address] Phone: [phone] Fax: [phone]  10/29/2020, 4:56 PM

## 2020-11-13 ENCOUNTER — Ambulatory Visit: Payer: BC Managed Care – PPO | Admitting: Nurse Practitioner

## 2020-11-13 NOTE — Patient Instructions (Incomplete)

## 2020-11-21 ENCOUNTER — Other Ambulatory Visit: Payer: Self-pay

## 2020-11-21 ENCOUNTER — Ambulatory Visit: Payer: BC Managed Care – PPO | Admitting: Nurse Practitioner

## 2020-11-21 ENCOUNTER — Encounter: Payer: Self-pay | Admitting: Nurse Practitioner

## 2020-11-21 VITALS — BP 143/92 | HR 70 | Ht 64.0 in | Wt 198.0 lb

## 2020-11-21 DIAGNOSIS — E782 Mixed hyperlipidemia: Secondary | ICD-10-CM

## 2020-11-21 DIAGNOSIS — I10 Essential (primary) hypertension: Secondary | ICD-10-CM

## 2020-11-21 DIAGNOSIS — E1165 Type 2 diabetes mellitus with hyperglycemia: Secondary | ICD-10-CM | POA: Diagnosis not present

## 2020-11-21 NOTE — Progress Notes (Signed)
Endocrinology Follow Up Note       11/21/2020, 9:07 AM   Subjective:    Patient ID: Shawn Velazquez, male    DOB: 08-Apr-1966.  Shawn Velazquez is being seen in follow up after being seen in consultation for management of currently uncontrolled symptomatic diabetes requested by  Noreene Larsson, NP.   Past Medical History:  Diagnosis Date  . Anxiety   . Arthritis    affects hands, shoulder, neck, knees, hips, ankles, toes  . Chronic back pain   . Diabetes mellitus    diet controlled  . Eczema   . Fatty liver, alcoholic   . GERD (gastroesophageal reflux disease)   . Glaucoma    slight case  . Hypertension   . Psoriasis   . Sleep apnea   . Substance abuse ([place])   . Vertigo 09/11/2017  . Wears glasses     Past Surgical History:  Procedure Laterality Date  . MULTIPLE TOOTH EXTRACTIONS    . POSTERIOR CERVICAL LAMINECTOMY  04/21/2012   Procedure: POSTERIOR CERVICAL LAMINECTOMY;  Surgeon: Elaina Hoops, MD;  Location: [place] [facility];  Service: Neurosurgery;  Laterality: Left;  Posterior Cervical laminectomy/foraminotomy and diskectomy, left cervical seven-thoracic one  . ROTATOR CUFF REPAIR    . THORACIC DISCECTOMY Left 04/03/2014   Procedure: Left Thoracic Seven to Eight, Thoracic Eight to Nine Thoracic Discectomy ;  Surgeon: Elaina Hoops, MD;  Location: [place] [facility];  Service: Neurosurgery;  Laterality: Left;    Social History   Socioeconomic History  . Marital status: Divorced    Spouse name: Not on file  . Number of children: Not on file  . Years of education: Not on file  . Highest education level: Not on file  Occupational History    Comment: Truck to storage  Tobacco Use  . Smoking status: Current Some Day Smoker    Packs/day: 0.25    Years: 40.00    Pack years: 10.00    Types: E-cigarettes, Cigarettes  . Smokeless tobacco: Never Used  . Tobacco comment: pt trying to quit on his own  Vaping  Use  . Vaping Use: Former  Substance and Sexual Activity  . Alcohol use: Yes    Comment: Either Smirnoff Smash-4 per day or Fireball-10 airplane bottles in a day  . Drug use: No  . Sexual activity: Yes    Birth control/protection: None  Other Topics Concern  . Not on file  Social History Narrative  . Not on file   Social Determinants of Health   Financial Resource Strain: Not on file  Food Insecurity: Not on file  Transportation Needs: Not on file  Physical Activity: Not on file  Stress: Not on file  Social Connections: Not on file    Family History  Problem Relation Age of Onset  . Diabetes Father   . Cancer - Lung Father   . Hypertension Other   . Cancer - Lung Other     Outpatient Encounter Medications as of 11/21/2020  Medication Sig  . atorvastatin (LIPITOR) 40 MG tablet Take 1 tablet (40 mg total) by mouth every morning.  Marland Kitchen FLUoxetine (PROZAC) 10 MG capsule Take 30 mg  by mouth daily.  . hydrochlorothiazide (HYDRODIURIL) 25 MG tablet Take 1 tablet (25 mg total) by mouth daily.  Marland Kitchen ibuprofen (ADVIL) 800 MG tablet Take 1 tablet (800 mg total) by mouth every 8 (eight) hours as needed for moderate pain. For pain  . insulin glargine (LANTUS SOLOSTAR) 100 UNIT/ML Solostar Pen Inject 22 Units into the skin daily.  . insulin lispro (HUMALOG KWIKPEN) 100 UNIT/ML KwikPen Inject 5-11 Units into the skin 3 (three) times daily.  . Insulin Pen Needle (PEN NEEDLES) 32G X 4 MM MISC 1 each by Does not apply route in the morning, at noon, in the evening, and at bedtime.  Marland Kitchen LORazepam (ATIVAN) 1 MG tablet Take 1 tablet (1 mg total) by mouth 3 (three) times daily.  Marland Kitchen losartan (COZAAR) 50 MG tablet Take 1 tablet (50 mg total) by mouth daily.  . meclizine (ANTIVERT) 25 MG tablet Take 25 mg by mouth 3 (three) times daily as needed for dizziness.  . metFORMIN (GLUCOPHAGE-XR) 500 MG 24 hr tablet Take 500 mg by mouth daily with breakfast.  . tiZANidine (ZANAFLEX) 2 MG tablet SMARTSIG:1 Tablet(s)  By Mouth 1 to 3 Times Daily PRN   No facility-administered encounter medications on file as of 11/21/2020.    ALLERGIES: Allergies  Allergen Reactions  . Neosporin + Pain Relief Max St [Neomy-Bacit-Polymyx-Pramoxine] Rash    VACCINATION STATUS:  There is no immunization history on file for this patient.  Diabetes He presents for his follow-up diabetic visit. He has type 2 diabetes mellitus. Onset time: was diagnosed at approx age of 27. His disease course has been improving. There are no hypoglycemic associated symptoms. Associated symptoms include blurred vision, fatigue, polydipsia and polyuria. There are no hypoglycemic complications. Symptoms are improving. Diabetic complications include nephropathy. Risk factors for coronary artery disease include diabetes mellitus, dyslipidemia, hypertension, male sex, obesity and sedentary lifestyle. Current diabetic treatment includes intensive insulin program and oral agent (monotherapy). He is compliant with treatment some of the time. His weight is fluctuating minimally. He is following a generally unhealthy diet. When asked about meal planning, he reported none. He has not had a previous visit with a dietitian. He rarely participates in exercise. His home blood glucose trend is decreasing steadily. His breakfast blood glucose range is generally 130-140 mg/dl. His lunch blood glucose range is generally 180-200 mg/dl. His dinner blood glucose range is generally 180-200 mg/dl. His bedtime blood glucose range is generally 140-180 mg/dl. (He presents today with his meter and logs showing improving glycemic profile overall.  Analysis of his meter shows 7-day average of 182, 14-day average of 206, 30-day average of 241.  He is still adjusting to his new eating pattern with 3 meals per day and his insulin injection via SSI.  He has been taking his Humalog at bedtime on some occasions, which we discussed today as being a safety hazard.  There are no episodes of  hypoglycemia documented.) An ACE inhibitor/angiotensin II receptor blocker is being taken. He does not see a podiatrist.Eye exam is current.  Hyperlipidemia This is a chronic problem. The current episode started more than 1 year ago. The problem is controlled. Recent lipid tests were reviewed and are normal. Exacerbating diseases include chronic renal disease, diabetes and obesity. Factors aggravating his hyperlipidemia include thiazides and fatty foods. Current antihyperlipidemic treatment includes statins. The current treatment provides mild improvement of lipids. Compliance problems include adherence to diet, adherence to exercise and psychosocial issues.  Risk factors for coronary artery disease include diabetes mellitus,  dyslipidemia, family history, hypertension, male sex, obesity and a sedentary lifestyle.  Hypertension This is a chronic problem. The current episode started more than 1 year ago. The problem is unchanged. The problem is uncontrolled. Associated symptoms include blurred vision. There are no associated agents to hypertension. Risk factors for coronary artery disease include diabetes mellitus, dyslipidemia, family history, male gender, obesity and sedentary lifestyle. Past treatments include diuretics and angiotensin blockers. The current treatment provides mild improvement. Compliance problems include diet, exercise and psychosocial issues.  Hypertensive end-organ damage includes kidney disease. Identifiable causes of hypertension include chronic renal disease.     Review of systems  Constitutional: + Minimally fluctuating body weight,  current Body mass index is 33.99 kg/m. , + fatigue-improving, no subjective hyperthermia, no subjective hypothermia Eyes: + blurry vision-improving, no xerophthalmia ENT: no sore throat, no nodules palpated in throat, no dysphagia/odynophagia, no hoarseness Cardiovascular: no chest pain, no shortness of breath, no palpitations, no leg  swelling Respiratory: no cough, no shortness of breath Gastrointestinal: no nausea/vomiting/diarrhea Musculoskeletal: no muscle/joint aches Skin: no rashes, no hyperemia Neurological: no tremors, no numbness, no tingling, no dizziness Psychiatric: no depression, no anxiety  Objective:     BP (!) 143/92   Pulse 70   Ht 5\' 4"  (1.626 m)   Wt 198 lb (89.8 kg)   BMI 33.99 kg/m   Wt Readings from Last 3 Encounters:  11/21/20 198 lb (89.8 kg)  10/29/20 194 lb 12.8 oz (88.4 kg)  10/15/20 194 lb (88 kg)     BP Readings from Last 3 Encounters:  11/21/20 (!) 143/92  10/29/20 (!) 155/98  10/15/20 104/70      Physical Exam- Limited  Constitutional:  Body mass index is 33.99 kg/m. , not in acute distress, normal state of mind Eyes:  EOMI, no exophthalmos Neck: Supple Cardiovascular: RRR, no murmurs, rubs, or gallops, no edema Respiratory: Adequate breathing efforts, no crackles, rales, rhonchi, or wheezing Musculoskeletal: no gross deformities, strength intact in all four extremities, no gross restriction of joint movements Skin:  no rashes, no hyperemia Neurological: no tremor with outstretched hands   POCT ABI Results 11/21/20   Right ABI:  1.33      Left ABI:  1.29  Right leg systolic / diastolic: 470/962 mmHg Left leg systolic / diastolic: 836/62 mmHg  Arm systolic / diastolic: 947/65 mmHG  Detailed report will be scanned into patient chart.    CMP ( most recent) CMP     Component Value Date/Time   NA 141 10/15/2020 0846   K 3.6 10/15/2020 0846   CL 98 10/15/2020 0846   CO2 21 10/15/2020 0846   GLUCOSE 120 (H) 10/15/2020 0846   GLUCOSE 127 (H) 09/11/2017 2143   BUN 14 10/15/2020 0846   CREATININE 1.22 10/15/2020 0846   CALCIUM 10.1 10/15/2020 0846   PROT 7.9 10/15/2020 0846   ALBUMIN 4.6 10/15/2020 0846   AST 97 (H) 10/15/2020 0846   ALT 75 (H) 10/15/2020 0846   ALKPHOS 195 (H) 10/15/2020 0846   BILITOT 1.1 10/15/2020 0846   GFRNONAA 72 07/10/2020 1005    GFRAA 84 07/10/2020 1005     Diabetic Labs (most recent): Lab Results  Component Value Date   HGBA1C 9.1 (H) 10/15/2020   HGBA1C 7.2 (H) 07/10/2020   HGBA1C 7.1 (H) 04/19/2012     Lipid Panel ( most recent) Lipid Panel     Component Value Date/Time   CHOL 125 10/15/2020 0846   TRIG 142 10/15/2020 0846   HDL 44  10/15/2020 0846   LDLCALC 56 10/15/2020 0846   LABVLDL 25 10/15/2020 0846      No results found for: TSH, FREET4         Assessment & Plan:   1) Uncontrolled type 2 diabetes mellitus with hyperglycemia ([place])  He presents today with his meter and logs showing improving glycemic profile overall.  Analysis of his meter shows 7-day average of 182, 14-day average of 206, 30-day average of 241.  He is still adjusting to his new eating pattern with 3 meals per day and his insulin injection via SSI.  He has been taking his Humalog at bedtime on some occasions, which we discussed today as being a safety hazard.  There are no episodes of hypoglycemia documented.  - Shawn Velazquez has currently uncontrolled symptomatic type 2 DM since 55 years of age, with most recent A1c of 9.1 %.   -Recent labs reviewed.  - I had a long discussion with him about the progressive nature of diabetes and the pathology behind its complications. -his diabetes is complicated by chronic alcohol abuse, mild CKD and he remains at a high risk for more acute and chronic complications which include CAD, CVA, CKD, retinopathy, and neuropathy. These are all discussed in detail with him.  - Nutritional counseling repeated at each appointment due to patients tendency to fall back in to old habits.  - The patient admits there is a room for improvement in their diet and drink choices. -  Suggestion is made for the patient to avoid simple carbohydrates from their diet including Cakes, Sweet Desserts / Pastries, Ice Cream, Soda (diet and regular), Sweet Tea, Candies, Chips, Cookies, Sweet Pastries, Store  Bought Juices, Alcohol in Excess of 1-2 drinks a day, Artificial Sweeteners, Coffee Creamer, and "Sugar-free" Products. This will help patient to have stable blood glucose profile and potentially avoid unintended weight gain.   - I encouraged the patient to switch to unprocessed or minimally processed complex starch and increased protein intake (animal or plant source), fruits, and vegetables.   - Patient is advised to stick to a routine mealtimes to eat 3 meals a day and avoid unnecessary snacks (to snack only to correct hypoglycemia).  - I have approached him with the following individualized plan to manage  his diabetes and patient agrees:   -Based on his improving glycemic profile, he is advised to continue current medication regimen.  He is advised to continue Lantus 22 units SQ nightly, continue Humalos 5-11 units TID with meals if glucose is above 90 and he is eating (specific instructions on how to titrate insulin dose given to patient in writing and we discussed in detail once again today).  He is warned not to take his Humalog at bedtime to avoid nocturnal hypoglycemia.  He can continue Metformin 500 mg ER daily with breakfast.    -he is encouraged to continue monitoring blood glucose 4 times daily, before meals and before bed, and to call the clinic if he has readings less than 70 or greater than 300 for 3 tests in a row.   - he is warned not to take insulin without proper monitoring per orders. - Adjustment parameters are given to him for hypo and hyperglycemia in writing.  - he will be considered for incretin therapy as appropriate next visit.  - Specific targets for  A1c;  LDL, HDL,  and Triglycerides were discussed with the patient.  2) Blood Pressure /Hypertension:  his blood pressure is improving but not  controlled to target.   he is advised to continue his current medications including Losartan 50 mg po daily and HCTZ 25 mg p.o. daily with breakfast.  3) Lipids/Hyperlipidemia:     Review of his recent lipid panel from 10/15/20 showed controlled  LDL at 56 .  he  is advised to continue Lipitor 40 mg daily at bedtime.  Side effects and precautions discussed with him.  4)  Weight/Diet:  his Body mass index is 33.99 kg/m.  -  clearly complicating his diabetes care.   he is a candidate for weight loss. I discussed with him the fact that loss of 5 - 10% of his  current body weight will have the most impact on his diabetes management.  Exercise, and detailed carbohydrates information provided  -  detailed on discharge instructions.  5) Chronic Care/Health Maintenance: -he is on ACEI/ARB and Statin medications and is encouraged to initiate and continue to follow up with Ophthalmology, Dentist, Podiatrist at least yearly or according to recommendations, and advised to stay away from smoking. I have recommended yearly flu vaccine and pneumonia vaccine at least every 5 years; moderate intensity exercise for up to 150 minutes weekly; and sleep for at least 7 hours a day.  - he is advised to maintain close follow up with Noreene Larsson, NP for primary care needs, as well as his other providers for optimal and coordinated care.     I spent 30 minutes in the care of the patient today including review of labs from [place], Lipids, Thyroid Function, Hematology (current and previous including abstractions from other facilities); face-to-face time discussing  his blood glucose readings/logs, discussing hypoglycemia and hyperglycemia episodes and symptoms, medications doses, his options of short and long term treatment based on the latest standards of care / guidelines;  discussion about incorporating lifestyle medicine;  and documenting the encounter.    Please refer to Patient Instructions for Blood Glucose Monitoring and Insulin/Medications Dosing Guide"  in media tab for additional information. Please  also refer to " Patient Self Inventory" in the Media  tab for reviewed elements of pertinent  patient history.  Shirlean Mylar participated in the discussions, expressed understanding, and voiced agreement with the above plans.  All questions were answered to his satisfaction. he is encouraged to contact clinic should he have any questions or concerns prior to his return visit.   Follow up plan: - Return in about 3 months (around 02/21/2021) for Diabetes F/U with A1c in office, No previsit labs, Bring meter and logs.  Rayetta Pigg, [facility] [address] [place], [place] [address] Phone: [phone] Fax: [phone]  11/21/2020, 9:07 AM

## 2020-11-21 NOTE — Patient Instructions (Signed)

## 2021-01-16 ENCOUNTER — Ambulatory Visit: Payer: BC Managed Care – PPO | Admitting: Nurse Practitioner

## 2021-02-05 ENCOUNTER — Ambulatory Visit: Payer: BC Managed Care – PPO | Admitting: Nurse Practitioner

## 2021-02-05 ENCOUNTER — Other Ambulatory Visit: Payer: Self-pay

## 2021-02-05 ENCOUNTER — Encounter: Payer: Self-pay | Admitting: Nurse Practitioner

## 2021-02-05 VITALS — BP 146/80 | HR 76 | Ht 65.5 in | Wt 202.0 lb

## 2021-02-05 DIAGNOSIS — E1165 Type 2 diabetes mellitus with hyperglycemia: Secondary | ICD-10-CM

## 2021-02-05 DIAGNOSIS — I10 Essential (primary) hypertension: Secondary | ICD-10-CM

## 2021-02-05 DIAGNOSIS — E782 Mixed hyperlipidemia: Secondary | ICD-10-CM | POA: Diagnosis not present

## 2021-02-05 MED ORDER — LOSARTAN POTASSIUM 50 MG PO TABS: 50.0000 mg | ORAL_TABLET | Freq: Every day | ORAL | 1 refills | Status: DC

## 2021-02-05 MED ORDER — ATORVASTATIN CALCIUM 40 MG PO TABS: 40.0000 mg | ORAL_TABLET | Freq: Every morning | ORAL | 1 refills | Status: DC

## 2021-02-05 MED ORDER — HYDROCHLOROTHIAZIDE 25 MG PO TABS: 25.0000 mg | ORAL_TABLET | Freq: Every day | ORAL | 1 refills | Status: DC

## 2021-02-05 MED ORDER — LANTUS SOLOSTAR 100 UNIT/ML ~~LOC~~ SOPN: 22.0000 [IU] | PEN_INJECTOR | Freq: Every day | SUBCUTANEOUS | 3 refills | Status: DC

## 2021-02-05 MED ORDER — LORAZEPAM 1 MG PO TABS: 1.0000 mg | ORAL_TABLET | Freq: Three times a day (TID) | ORAL | 2 refills | Status: DC

## 2021-02-05 MED ORDER — INSULIN LISPRO (1 UNIT DIAL) 100 UNIT/ML (KWIKPEN): 5.0000 [IU] | PEN_INJECTOR | Freq: Three times a day (TID) | SUBCUTANEOUS | 3 refills | Status: DC

## 2021-02-05 NOTE — Assessment & Plan Note (Signed)
-  checking A1c with labs -he states he doesn't have labs scheduled with endocrinology -taking lantus and humalog as well as metformin

## 2021-02-05 NOTE — Patient Instructions (Signed)
Please have labs drawn today

## 2021-02-05 NOTE — Assessment & Plan Note (Signed)
-  checking lipids -goal LDL < 70

## 2021-02-05 NOTE — Progress Notes (Signed)
Established Patient Office Visit  Subjective:  Patient ID: Shawn Velazquez, male    DOB: [birth year]  Age: 55 y.o. MRN: [id number]  CC:  Chief Complaint  Patient presents with   Diabetes    Follow up    HPI Shawn Velazquez presents for lab follow-up.  He states that his blood sugar has been up and down, but he sees endocrinology for this.  His BP is elevated. He doesn't check his BP at home.   Past Medical History:  Diagnosis Date   Anxiety    Arthritis    affects hands, shoulder, neck, knees, hips, ankles, toes   Chronic back pain    Diabetes mellitus    diet controlled   Eczema    Fatty liver, alcoholic    GERD (gastroesophageal reflux disease)    Glaucoma    slight case   Hypertension    Psoriasis    Sleep apnea    Substance abuse ([place])    Vertigo 09/11/2017   Wears glasses     Past Surgical History:  Procedure Laterality Date   MULTIPLE TOOTH EXTRACTIONS     POSTERIOR CERVICAL LAMINECTOMY  04/21/2012   Procedure: POSTERIOR CERVICAL LAMINECTOMY;  Surgeon: Elaina Hoops, MD;  Location: [place] [facility];  Service: Neurosurgery;  Laterality: Left;  Posterior Cervical laminectomy/foraminotomy and diskectomy, left cervical seven-thoracic one   ROTATOR CUFF REPAIR     THORACIC DISCECTOMY Left 04/03/2014   Procedure: Left Thoracic Seven to Eight, Thoracic Eight to Nine Thoracic Discectomy ;  Surgeon: Elaina Hoops, MD;  Location: [place] [facility];  Service: Neurosurgery;  Laterality: Left;    Family History  Problem Relation Age of Onset   Diabetes Father    Cancer - Lung Father    Hypertension Other    Cancer - Lung Other     Social History   Socioeconomic History   Marital status: Divorced    Spouse name: Not on file   Number of children: Not on file   Years of education: Not on file   Highest education level: Not on file  Occupational History    Comment: Truck to storage  Tobacco Use   Smoking status: Some Days    Packs/day: 0.25    Years: 40.00    Pack  years: 10.00    Types: E-cigarettes, Cigarettes   Smokeless tobacco: Never   Tobacco comments:    pt trying to quit on his own  Vaping Use   Vaping Use: Former  Substance and Sexual Activity   Alcohol use: Yes    Comment: Either Smirnoff Smash-4 per day or Fireball-10 airplane bottles in a day   Drug use: No   Sexual activity: Yes    Birth control/protection: None  Other Topics Concern   Not on file  Social History Narrative   Not on file   Social Determinants of Health   Financial Resource Strain: Not on file  Food Insecurity: Not on file  Transportation Needs: Not on file  Physical Activity: Not on file  Stress: Not on file  Social Connections: Not on file  Intimate Partner Violence: Not on file    Outpatient Medications Prior to Visit  Medication Sig Dispense Refill   atorvastatin (LIPITOR) 40 MG tablet Take 1 tablet (40 mg total) by mouth every morning. 90 tablet 1   FLUoxetine (PROZAC) 10 MG capsule Take 30 mg by mouth daily.     hydrochlorothiazide (HYDRODIURIL) 25 MG tablet Take 1 tablet (25 mg total)  by mouth daily. 90 tablet 1   ibuprofen (ADVIL) 800 MG tablet Take 1 tablet (800 mg total) by mouth every 8 (eight) hours as needed for moderate pain. For pain 270 tablet 0   insulin glargine (LANTUS SOLOSTAR) 100 UNIT/ML Solostar Pen Inject 22 Units into the skin daily. 30 mL 3   insulin lispro (HUMALOG KWIKPEN) 100 UNIT/ML KwikPen Inject 5-11 Units into the skin 3 (three) times daily. 15 mL 3   Insulin Pen Needle (PEN NEEDLES) 32G X 4 MM MISC 1 each by Does not apply route in the morning, at noon, in the evening, and at bedtime. 100 each 11   LORazepam (ATIVAN) 1 MG tablet Take 1 tablet (1 mg total) by mouth 3 (three) times daily. 90 tablet 2   losartan (COZAAR) 50 MG tablet Take 1 tablet (50 mg total) by mouth daily. 90 tablet 1   meclizine (ANTIVERT) 25 MG tablet Take 25 mg by mouth 3 (three) times daily as needed for dizziness.     metFORMIN (GLUCOPHAGE-XR) 500 MG 24  hr tablet Take 500 mg by mouth daily with breakfast.     tiZANidine (ZANAFLEX) 2 MG tablet SMARTSIG:1 Tablet(s) By Mouth 1 to 3 Times Daily PRN     No facility-administered medications prior to visit.    Allergies  Allergen Reactions   Neosporin + Pain Relief Max St [Neomy-Bacit-Polymyx-Pramoxine] Rash    ROS Review of Systems  Constitutional: Negative.   Respiratory: Negative.    Cardiovascular: Negative.   Musculoskeletal: Negative.   Psychiatric/Behavioral: Negative.       Objective:    Physical Exam  BP (!) 146/80 (BP Location: Left Arm, Patient Position: Sitting, Cuff Size: Large)   Pulse 76   Ht 5' 5.5" (1.664 m)   Wt 202 lb (91.6 kg)   SpO2 95%   BMI 33.10 kg/m  Wt Readings from Last 3 Encounters:  02/05/21 202 lb (91.6 kg)  11/21/20 198 lb (89.8 kg)  10/29/20 194 lb 12.8 oz (88.4 kg)     Health Maintenance Due  Topic Date Due   PNEUMOCOCCAL POLYSACCHARIDE VACCINE AGE 63-64 HIGH RISK  Never done   Pneumococcal Vaccine 31-67 Years old (1 - PCV) Never done   FOOT EXAM  Never done   OPHTHALMOLOGY EXAM  Never done   COLONOSCOPY (Pts 45-33yr Insurance coverage will need to be confirmed)  Never done   Zoster Vaccines- Shingrix (1 of 2) Never done   INFLUENZA VACCINE  02/02/2021    There are no preventive care reminders to display for this patient.  No results found for: TSH Lab Results  Component Value Date   WBC 7.2 10/15/2020   HGB 15.0 10/15/2020   HCT 44.8 10/15/2020   MCV 97 10/15/2020   PLT 166 10/15/2020   Lab Results  Component Value Date   NA 141 10/15/2020   K 3.6 10/15/2020   CO2 21 10/15/2020   GLUCOSE 120 (H) 10/15/2020   BUN 14 10/15/2020   CREATININE 1.22 10/15/2020   BILITOT 1.1 10/15/2020   ALKPHOS 195 (H) 10/15/2020   AST 97 (H) 10/15/2020   ALT 75 (H) 10/15/2020   PROT 7.9 10/15/2020   ALBUMIN 4.6 10/15/2020   CALCIUM 10.1 10/15/2020   ANIONGAP 11 09/11/2017   EGFR 70 10/15/2020   Lab Results  Component Value Date    CHOL 125 10/15/2020   Lab Results  Component Value Date   HDL 44 10/15/2020   Lab Results  Component Value Date   LDLCALC  56 10/15/2020   Lab Results  Component Value Date   TRIG 142 10/15/2020   No results found for: CHOLHDL Lab Results  Component Value Date   HGBA1C 9.1 (H) 10/15/2020      Assessment & Plan:   Problem List Items Addressed This Visit       Cardiovascular and Mediastinum   Hypertension, essential - Primary    -BP slightly elevated -takes HCTZ and losartan       Relevant Orders   CBC with Differential/Platelet   CMP14+EGFR   Lipid Panel With LDL/HDL Ratio     Endocrine   Type II diabetes mellitus, uncontrolled (HCC)    -checking A1c with labs -he states he doesn't have labs scheduled with endocrinology -taking lantus and humalog as well as metformin       Relevant Orders   Lipid Panel With LDL/HDL Ratio   Hemoglobin A1c     Other   Hyperlipidemia    -checking lipids -goal LDL < 70       Relevant Orders   CBC with Differential/Platelet   CMP14+EGFR   Lipid Panel With LDL/HDL Ratio    No orders of the defined types were placed in this encounter.   Follow-up: Return in about 6 months (around 08/08/2021) for Physical Exam (same-day fasting labs).    Noreene Larsson, NP

## 2021-02-05 NOTE — Assessment & Plan Note (Signed)
-  BP slightly elevated -takes HCTZ and losartan

## 2021-02-06 LAB — CMP14+EGFR
ALT: 44 IU/L (ref 0–44)
AST: 59 IU/L — ABNORMAL HIGH (ref 0–40)
Albumin/Globulin Ratio: 1.4 (ref 1.2–2.2)
Albumin: 4 g/dL (ref 3.8–4.9)
Alkaline Phosphatase: 157 IU/L — ABNORMAL HIGH (ref 44–121)
BUN/Creatinine Ratio: 9 (ref 9–20)
BUN: 10 mg/dL (ref 6–24)
Bilirubin Total: 0.6 mg/dL (ref 0.0–1.2)
CO2: 25 mmol/L (ref 20–29)
Calcium: 9.3 mg/dL (ref 8.7–10.2)
Chloride: 102 mmol/L (ref 96–106)
Creatinine, Ser: 1.15 mg/dL (ref 0.76–1.27)
Globulin, Total: 2.8 g/dL (ref 1.5–4.5)
Glucose: 248 mg/dL — ABNORMAL HIGH (ref 65–99)
Potassium: 3.8 mmol/L (ref 3.5–5.2)
Sodium: 142 mmol/L (ref 134–144)
Total Protein: 6.8 g/dL (ref 6.0–8.5)
eGFR: 75 mL/min/{1.73_m2} (ref >59)

## 2021-02-06 LAB — CBC WITH DIFFERENTIAL/PLATELET
Basophils Absolute: 0.1 10*3/uL (ref 0.0–0.2)
Basos: 1 %
EOS (ABSOLUTE): 0.3 10*3/uL (ref 0.0–0.4)
Eos: 4 %
Hematocrit: 41.1 % (ref 37.5–51.0)
Hemoglobin: 13.5 g/dL (ref 13.0–17.7)
Immature Grans (Abs): 0 10*3/uL (ref 0.0–0.1)
Immature Granulocytes: 0 %
Lymphocytes Absolute: 2.3 10*3/uL (ref 0.7–3.1)
Lymphs: 28 %
MCH: 32.1 pg (ref 26.6–33.0)
MCHC: 32.8 g/dL (ref 31.5–35.7)
MCV: 98 fL — ABNORMAL HIGH (ref 79–97)
Monocytes Absolute: 0.6 10*3/uL (ref 0.1–0.9)
Monocytes: 8 %
Neutrophils Absolute: 4.8 10*3/uL (ref 1.4–7.0)
Neutrophils: 59 %
Platelets: 141 10*3/uL — ABNORMAL LOW (ref 150–450)
RBC: 4.2 x10E6/uL (ref 4.14–5.80)
RDW: 12.3 % (ref 11.6–15.4)
WBC: 8.1 10*3/uL (ref 3.4–10.8)

## 2021-02-06 LAB — LIPID PANEL WITH LDL/HDL RATIO
Cholesterol, Total: 128 mg/dL (ref 100–199)
HDL: 51 mg/dL (ref >39)
LDL Chol Calc (NIH): 62 mg/dL (ref 0–99)
LDL/HDL Ratio: 1.2 ratio (ref 0.0–3.6)
Triglycerides: 78 mg/dL (ref 0–149)
VLDL Cholesterol Cal: 15 mg/dL (ref 5–40)

## 2021-02-06 LAB — HEMOGLOBIN A1C
Est. average glucose Bld gHb Est-mCnc: 249 mg/dL
Hgb A1c MFr Bld: 10.3 % — ABNORMAL HIGH (ref 4.8–5.6)

## 2021-02-06 NOTE — Progress Notes (Signed)
Liver function tests are improving, however, A1c is up at 10.3 from 9.1. Follow-up with endocrinology for diabetes management.

## 2021-02-27 ENCOUNTER — Ambulatory Visit: Payer: BC Managed Care – PPO | Admitting: Nurse Practitioner

## 2021-03-19 ENCOUNTER — Other Ambulatory Visit: Payer: Self-pay | Admitting: Nurse Practitioner

## 2021-03-19 ENCOUNTER — Ambulatory Visit: Payer: BC Managed Care – PPO | Admitting: Nurse Practitioner

## 2021-03-19 ENCOUNTER — Telehealth: Payer: Self-pay

## 2021-03-19 DIAGNOSIS — M2550 Pain in unspecified joint: Secondary | ICD-10-CM

## 2021-03-19 MED ORDER — IBUPROFEN 800 MG PO TABS: 800.0000 mg | ORAL_TABLET | Freq: Three times a day (TID) | ORAL | 0 refills | Status: DC | PRN

## 2021-03-19 NOTE — Telephone Encounter (Signed)
Patient called need med refill   ibuprofen (ADVIL) 800 MG tablet   Pharmacy: [place]

## 2021-03-19 NOTE — Telephone Encounter (Signed)
Sent!

## 2021-05-18 ENCOUNTER — Other Ambulatory Visit: Payer: Self-pay | Admitting: Internal Medicine

## 2021-05-18 ENCOUNTER — Encounter: Payer: Self-pay | Admitting: Internal Medicine

## 2021-05-18 ENCOUNTER — Other Ambulatory Visit: Payer: Self-pay

## 2021-05-18 ENCOUNTER — Ambulatory Visit: Payer: BC Managed Care – PPO | Admitting: Internal Medicine

## 2021-05-18 DIAGNOSIS — J209 Acute bronchitis, unspecified: Secondary | ICD-10-CM | POA: Diagnosis not present

## 2021-05-18 MED ORDER — AZITHROMYCIN 250 MG PO TABS: ORAL_TABLET | ORAL | 0 refills | Status: AC

## 2021-05-18 MED ORDER — ALBUTEROL SULFATE HFA 108 (90 BASE) MCG/ACT IN AERS: 2.0000 | INHALATION_SPRAY | Freq: Four times a day (QID) | RESPIRATORY_TRACT | 0 refills | Status: DC | PRN

## 2021-05-18 NOTE — Progress Notes (Signed)
Virtual Visit via Telephone Note   This visit type was conducted due to national recommendations for restrictions regarding the COVID-19 Pandemic (e.g. social distancing) in an effort to limit this patient's exposure and mitigate transmission in our community.  Due to his co-morbid illnesses, this patient is at least at moderate risk for complications without adequate follow up.  This format is felt to be most appropriate for this patient at this time.  The patient did not have access to video technology/had technical difficulties with video requiring transitioning to audio format only (telephone).  All issues noted in this document were discussed and addressed.  No physical exam could be performed with this format.  Evaluation Performed:  Follow-up visit  Date:  05/18/2021   ID:  Shawn Velazquez, DOB 03-Dec-1965, MRN [id number]  Patient Location: Home Provider Location: Office/Clinic  Participants: Patient Location of Patient: Home Location of Provider: Telehealth Consent was obtain for visit to be over via telehealth. I verified that I am speaking with the correct person using two identifiers.  PCP:  Noreene Larsson, NP   Chief Complaint: Cough and chest tightness  History of Present Illness:    Shawn Velazquez is a 55 y.o. male who has televisit for complaint of cough, chest tightness and back pain while coughing.  He recently quit smoking.  He has tried Mucinex and NyQuil with some relief.  He also reports mild dyspnea and wheezing at times.  He denies any fever, chills.  His home COVID test was negative.  The patient does not have symptoms concerning for COVID-19 infection (fever, chills, cough, or new shortness of breath).   Past Medical, Surgical, Social History, Allergies, and Medications have been Reviewed.  Past Medical History:  Diagnosis Date   Anxiety    Arthritis    affects hands, shoulder, neck, knees, hips, ankles, toes   Chronic back pain    Diabetes  mellitus    diet controlled   Eczema    Fatty liver, alcoholic    GERD (gastroesophageal reflux disease)    Glaucoma    slight case   Hypertension    Psoriasis    Sleep apnea    Substance abuse ([place])    Vertigo 09/11/2017   Wears glasses    Past Surgical History:  Procedure Laterality Date   MULTIPLE TOOTH EXTRACTIONS     POSTERIOR CERVICAL LAMINECTOMY  04/21/2012   Procedure: POSTERIOR CERVICAL LAMINECTOMY;  Surgeon: Elaina Hoops, MD;  Location: [place];  Service: Neurosurgery;  Laterality: Left;  Posterior Cervical laminectomy/foraminotomy and diskectomy, left cervical seven-thoracic one   ROTATOR CUFF REPAIR     THORACIC DISCECTOMY Left 04/03/2014   Procedure: Left Thoracic Seven to Eight, Thoracic Eight to Nine Thoracic Discectomy ;  Surgeon: Elaina Hoops, MD;  Location: [facility];  Service: Neurosurgery;  Laterality: Left;     Current Meds  Medication Sig   atorvastatin (LIPITOR) 40 MG tablet Take 1 tablet (40 mg total) by mouth every morning.   FLUoxetine (PROZAC) 10 MG capsule Take 30 mg by mouth daily.   hydrochlorothiazide (HYDRODIURIL) 25 MG tablet Take 1 tablet (25 mg total) by mouth daily.   ibuprofen (ADVIL) 800 MG tablet Take 1 tablet (800 mg total) by mouth every 8 (eight) hours as needed for moderate pain. For pain   insulin glargine (LANTUS SOLOSTAR) 100 UNIT/ML Solostar Pen Inject 22 Units into the skin daily.   insulin lispro (HUMALOG KWIKPEN) 100 UNIT/ML KwikPen Inject 5-11  Units into the skin 3 (three) times daily.   Insulin Pen Needle (PEN NEEDLES) 32G X 4 MM MISC 1 each by Does not apply route in the morning, at noon, in the evening, and at bedtime.   LORazepam (ATIVAN) 1 MG tablet Take 1 tablet (1 mg total) by mouth 3 (three) times daily.   losartan (COZAAR) 50 MG tablet Take 1 tablet (50 mg total) by mouth daily.   meclizine (ANTIVERT) 25 MG tablet Take 25 mg by mouth 3 (three) times daily as needed for dizziness.   metFORMIN (GLUCOPHAGE-XR) 500 MG 24  hr tablet Take 500 mg by mouth daily with breakfast.   tiZANidine (ZANAFLEX) 2 MG tablet SMARTSIG:1 Tablet(s) By Mouth 1 to 3 Times Daily PRN     Allergies:   Neosporin + pain relief max st [neomy-bacit-polymyx-pramoxine]   ROS:   Please see the history of present illness.     All other systems reviewed and are negative.   Labs/Other Tests and Data Reviewed:    Recent Labs: 02/05/2021: ALT 44; BUN 10; Creatinine, Ser 1.15; Hemoglobin 13.5; Platelets 141; Potassium 3.8; Sodium 142   Recent Lipid Panel Lab Results  Component Value Date/Time   CHOL 128 02/05/2021 02:39 PM   TRIG 78 02/05/2021 02:39 PM   HDL 51 02/05/2021 02:39 PM   LDLCALC 62 02/05/2021 02:39 PM    Wt Readings from Last 3 Encounters:  02/05/21 202 lb (91.6 kg)  11/21/20 198 lb (89.8 kg)  10/29/20 194 lb 12.8 oz (88.4 kg)      ASSESSMENT & PLAN:    Acute bronchitis Started azithromycin Would avoid starting steroid due to uncontrolled type II DM Continue Mucinex or Robitussin as needed for cough Albuterol as needed for dyspnea or wheezing If persistent, will get imaging Advised to refrain from smoking  Time:   Today, I have spent 15 minutes reviewing the chart, including problem list, medications, and with the patient with telehealth technology discussing the above problems.   Medication Adjustments/Labs and Tests Ordered: Current medicines are reviewed at length with the patient today.  Concerns regarding medicines are outlined above.   Tests Ordered: No orders of the defined types were placed in this encounter.   Medication Changes: No orders of the defined types were placed in this encounter.    Note: This dictation was prepared with Dragon dictation along with smaller phrase technology. Similar sounding words can be transcribed inadequately or may not be corrected upon review. Any transcriptional errors that result from this process are unintentional.      Disposition:  Follow up   Signed, Lindell Spar, MD  05/18/2021 5:03 PM     [employer]

## 2021-05-25 ENCOUNTER — Telehealth: Payer: Self-pay | Admitting: Nurse Practitioner

## 2021-05-25 NOTE — Telephone Encounter (Signed)
Pt called in requesting note for being seen via tele visit on 11/14

## 2021-05-26 ENCOUNTER — Encounter: Payer: Self-pay | Admitting: *Deleted

## 2021-05-26 NOTE — Telephone Encounter (Signed)
Note has been provided and left for pt to pick up please let him know

## 2021-05-26 NOTE — Telephone Encounter (Signed)
Called patient and left a voicemail that letter is ready to be picked up

## 2021-06-02 ENCOUNTER — Telehealth: Payer: Self-pay | Admitting: Nurse Practitioner

## 2021-06-02 ENCOUNTER — Other Ambulatory Visit: Payer: Self-pay | Admitting: *Deleted

## 2021-06-02 MED ORDER — FLUOXETINE HCL 10 MG PO CAPS: 30.0000 mg | ORAL_CAPSULE | Freq: Every day | ORAL | 0 refills | Status: DC

## 2021-06-02 NOTE — Telephone Encounter (Signed)
Pt . Called in for refill on   FLUoxetine (PROZAC) 10 MG capsule   Pt has been having a hard time getting med filled. Also states that pharm faxed over refill request as well.   [facility] on [address]

## 2021-06-02 NOTE — Telephone Encounter (Signed)
Refill sent in to pharmacy 

## 2021-06-05 ENCOUNTER — Other Ambulatory Visit: Payer: Self-pay | Admitting: Nurse Practitioner

## 2021-06-17 ENCOUNTER — Other Ambulatory Visit: Payer: Self-pay | Admitting: Nurse Practitioner

## 2021-06-17 MED ORDER — FLUOXETINE HCL 10 MG PO CAPS: 30.0000 mg | ORAL_CAPSULE | Freq: Every day | ORAL | 0 refills | Status: DC

## 2021-07-17 ENCOUNTER — Ambulatory Visit: Payer: BC Managed Care – PPO | Admitting: Family Medicine

## 2021-07-17 ENCOUNTER — Encounter: Payer: Self-pay | Admitting: Family Medicine

## 2021-07-17 ENCOUNTER — Other Ambulatory Visit: Payer: Self-pay

## 2021-07-17 ENCOUNTER — Ambulatory Visit: Payer: BC Managed Care – PPO

## 2021-07-17 DIAGNOSIS — J989 Respiratory disorder, unspecified: Secondary | ICD-10-CM | POA: Diagnosis not present

## 2021-07-17 DIAGNOSIS — E782 Mixed hyperlipidemia: Secondary | ICD-10-CM | POA: Diagnosis not present

## 2021-07-17 DIAGNOSIS — I10 Essential (primary) hypertension: Secondary | ICD-10-CM | POA: Diagnosis not present

## 2021-07-17 DIAGNOSIS — J209 Acute bronchitis, unspecified: Secondary | ICD-10-CM | POA: Diagnosis not present

## 2021-07-17 MED ORDER — AZITHROMYCIN 250 MG PO TABS: ORAL_TABLET | ORAL | 0 refills | Status: AC

## 2021-07-17 MED ORDER — MONTELUKAST SODIUM 10 MG PO TABS: 10.0000 mg | ORAL_TABLET | Freq: Every day | ORAL | 3 refills | Status: DC

## 2021-07-17 MED ORDER — PROMETHAZINE-DM 6.25-15 MG/5ML PO SYRP: ORAL_SOLUTION | ORAL | 0 refills | Status: DC

## 2021-07-17 MED ORDER — BENZONATATE 100 MG PO CAPS: 100.0000 mg | ORAL_CAPSULE | Freq: Two times a day (BID) | ORAL | 0 refills | Status: DC | PRN

## 2021-07-17 NOTE — Patient Instructions (Addendum)
F/U in office in 2 weeks, call if you need to be seen sooner  Please come for flu and covid test this morning, wAIT for flu result,  Need  lipid, cmp and eGFR, hBA1C  Montelukast is prescribed, and phenergan DM  , also azithromycin  Work excuse to return on 07/19/2021, out today  Thanks for choosing [facility], we consider it a privelige to serve you.

## 2021-07-17 NOTE — Progress Notes (Signed)
Virtual Visit via Telephone Note  I connected with NYREE YONKER on 07/17/21 at 11:00 AM EST by telephone and verified that I am speaking with the correct person using two identifiers.  Location: Patient: home Provider: office   I discussed the limitations, risks, security and privacy concerns of performing an evaluation and management service by telephone and the availability of in person appointments. I also discussed with the patient that there may be a patient responsible charge related to this service. The patient expressed understanding and agreed to proceed.   History of Present Illness: 3 day h/o cough, watery eyes , runny nose, no fever, body aches or chills.No known sick contact   Observations/Objective: There were no vitals taken for this visit.  Good communication with no confusion and intact memory. Alert and oriented x 3 No signs of respiratory distress during speech    Assessment and Plan: Acute bronchitis Z pack , singulair and phenergan DM prescribed. Work excuse to return 07/19/2021  Respiratory illness Swab for flu and covid, will treat if either is positive.Work excuse to return 07/19/2021  Type II diabetes mellitus, uncontrolled ([place]) Mr. Cordoba is reminded of the importance of commitment to daily physical activity for 30 minutes or more, as able and the need to limit carbohydrate intake to 30 to 60 grams per meal to help with blood sugar control.   The need to take medication as prescribed, test blood sugar as directed, and to call between visits if there is a concern that blood sugar is uncontrolled is also discussed.   Mr. Shatswell is reminded of the importance of daily foot exam, annual eye examination, and good blood sugar, blood pressure and cholesterol control. Updated lab needed at/ before next visit.   Diabetic Labs Latest Ref Rng & Units 02/05/2021 10/15/2020 07/10/2020 09/11/2017 09/11/2017  HbA1c 4.8 - 5.6 % 10.3(H) 9.1(H) 7.2(H) - -   Micro/Creat Ratio 0 - 29 mg/g creat - 13 - - -  Chol 100 - 199 mg/dL 128 125 118 - -  HDL >39 mg/dL 51 44 46 - -  Calc LDL 0 - 99 mg/dL 62 56 46 - -  Triglycerides 0 - 149 mg/dL 78 142 150(H) - -  Creatinine 0.76 - 1.27 mg/dL 1.15 1.22 1.13 0.83 0.92   BP/Weight 02/05/2021 11/21/2020 10/29/2020 10/15/2020 07/15/2020 07/01/2020 9/62/9528  Systolic BP 413 244 010 272 536 644 034  Diastolic BP 80 92 98 70 87 100 99  Wt. (Lbs) 202 198 194.8 194 200 203 200  BMI 33.1 33.99 31.44 31.31 32.28 32.77 32.28   No flowsheet data found.      Hyperlipidemia Hyperlipidemia:Low fat diet discussed and encouraged.   Lipid Panel  Lab Results  Component Value Date   CHOL 128 02/05/2021   HDL 51 02/05/2021   LDLCALC 62 02/05/2021   TRIG 78 02/05/2021     Updated lab needed at/ before next visit.   Hypertension, essential DASH diet and commitment to daily physical activity for a minimum of 30 minutes discussed and encouraged, as a part of hypertension management. The importance of attaining a healthy weight is also discussed.  BP/Weight 02/05/2021 11/21/2020 10/29/2020 10/15/2020 07/15/2020 07/01/2020 7/42/5956  Systolic BP 387 564 332 951 884 166 063  Diastolic BP 80 92 98 70 87 100 99  Wt. (Lbs) 202 198 194.8 194 200 203 200  BMI 33.1 33.99 31.44 31.31 32.28 32.77 32.28   Need offic e eval with meds      Follow Up  Instructions:    I discussed the assessment and treatment plan with the patient. The patient was provided an opportunity to ask questions and all were answered. The patient agreed with the plan and demonstrated an understanding of the instructions.   The patient was advised to call back or seek an in-person evaluation if the symptoms worsen or if the condition fails to improve as anticipated.  I provided 18 minutes of non-face-to-face time during this encounter.   Tula Nakayama, MD

## 2021-07-19 LAB — NOVEL CORONAVIRUS, NAA: SARS-CoV-2, NAA: NOT DETECTED

## 2021-07-19 LAB — SARS-COV-2, NAA 2 DAY TAT

## 2021-07-20 LAB — POCT INFLUENZA A/B
Influenza A, POC: NEGATIVE
Influenza B, POC: NEGATIVE

## 2021-07-20 NOTE — Progress Notes (Signed)
Patient came in to be swabbed for flu. Flu test was negative

## 2021-07-21 ENCOUNTER — Encounter: Payer: Self-pay | Admitting: Family Medicine

## 2021-07-21 DIAGNOSIS — J209 Acute bronchitis, unspecified: Secondary | ICD-10-CM | POA: Insufficient documentation

## 2021-07-21 DIAGNOSIS — J989 Respiratory disorder, unspecified: Secondary | ICD-10-CM | POA: Insufficient documentation

## 2021-07-21 DIAGNOSIS — G4730 Sleep apnea, unspecified: Secondary | ICD-10-CM | POA: Insufficient documentation

## 2021-07-21 NOTE — Assessment & Plan Note (Signed)
Swab for flu and covid, will treat if either is positive.Work excuse to return 07/19/2021

## 2021-07-21 NOTE — Assessment & Plan Note (Signed)
DASH diet and commitment to daily physical activity for a minimum of 30 minutes discussed and encouraged, as a part of hypertension management. The importance of attaining a healthy weight is also discussed.  BP/Weight 02/05/2021 11/21/2020 10/29/2020 10/15/2020 07/15/2020 07/01/2020 02/26/2352  Systolic BP 614 431 540 086 761 950 932  Diastolic BP 80 92 98 70 87 100 99  Wt. (Lbs) 202 198 194.8 194 200 203 200  BMI 33.1 33.99 31.44 31.31 32.28 32.77 32.28   Need offic e eval with meds

## 2021-07-21 NOTE — Assessment & Plan Note (Signed)
Hyperlipidemia:Low fat diet discussed and encouraged.   Lipid Panel  Lab Results  Component Value Date   CHOL 128 02/05/2021   HDL 51 02/05/2021   LDLCALC 62 02/05/2021   TRIG 78 02/05/2021     Updated lab needed at/ before next visit.

## 2021-07-21 NOTE — Assessment & Plan Note (Addendum)
Z pack , singulair and phenergan DM prescribed. Work excuse to return 07/19/2021

## 2021-07-21 NOTE — Assessment & Plan Note (Signed)
Mr. Shawn Velazquez is reminded of the importance of commitment to daily physical activity for 30 minutes or more, as able and the need to limit carbohydrate intake to 30 to 60 grams per meal to help with blood sugar control.   The need to take medication as prescribed, test blood sugar as directed, and to call between visits if there is a concern that blood sugar is uncontrolled is also discussed.   Mr. Shawn Velazquez is reminded of the importance of daily foot exam, annual eye examination, and good blood sugar, blood pressure and cholesterol control. Updated lab needed at/ before next visit.   Diabetic Labs Latest Ref Rng & Units 02/05/2021 10/15/2020 07/10/2020 09/11/2017 09/11/2017  HbA1c 4.8 - 5.6 % 10.3(H) 9.1(H) 7.2(H) - -  Micro/Creat Ratio 0 - 29 mg/g creat - 13 - - -  Chol 100 - 199 mg/dL 128 125 118 - -  HDL >39 mg/dL 51 44 46 - -  Calc LDL 0 - 99 mg/dL 62 56 46 - -  Triglycerides 0 - 149 mg/dL 78 142 150(H) - -  Creatinine 0.76 - 1.27 mg/dL 1.15 1.22 1.13 0.83 0.92   BP/Weight 02/05/2021 11/21/2020 10/29/2020 10/15/2020 07/15/2020 07/01/2020 02/12/314  Systolic BP 945 859 292 446 286 381 771  Diastolic BP 80 92 98 70 87 100 99  Wt. (Lbs) 202 198 194.8 194 200 203 200  BMI 33.1 33.99 31.44 31.31 32.28 32.77 32.28   No flowsheet data found.

## 2021-07-22 ENCOUNTER — Other Ambulatory Visit: Payer: Self-pay | Admitting: Internal Medicine

## 2021-07-22 ENCOUNTER — Telehealth: Payer: Self-pay

## 2021-07-22 NOTE — Telephone Encounter (Signed)
Patient calling about lab results from last week for his flu and covid.

## 2021-07-22 NOTE — Telephone Encounter (Signed)
Patient aware.

## 2021-07-31 ENCOUNTER — Encounter: Payer: Self-pay | Admitting: Nurse Practitioner

## 2021-07-31 ENCOUNTER — Ambulatory Visit: Payer: BC Managed Care – PPO | Admitting: Nurse Practitioner

## 2021-07-31 ENCOUNTER — Other Ambulatory Visit: Payer: Self-pay

## 2021-07-31 VITALS — BP 178/100 | HR 83 | Resp 16 | Ht 66.0 in | Wt 200.1 lb

## 2021-07-31 DIAGNOSIS — E785 Hyperlipidemia, unspecified: Secondary | ICD-10-CM | POA: Diagnosis not present

## 2021-07-31 DIAGNOSIS — Z1211 Encounter for screening for malignant neoplasm of colon: Secondary | ICD-10-CM | POA: Diagnosis not present

## 2021-07-31 DIAGNOSIS — Z23 Encounter for immunization: Secondary | ICD-10-CM

## 2021-07-31 DIAGNOSIS — F419 Anxiety disorder, unspecified: Secondary | ICD-10-CM

## 2021-07-31 DIAGNOSIS — E1165 Type 2 diabetes mellitus with hyperglycemia: Secondary | ICD-10-CM

## 2021-07-31 DIAGNOSIS — I10 Essential (primary) hypertension: Secondary | ICD-10-CM

## 2021-07-31 DIAGNOSIS — F32A Depression, unspecified: Secondary | ICD-10-CM

## 2021-07-31 MED ORDER — FLUOXETINE HCL 10 MG PO CAPS: ORAL_CAPSULE | ORAL | 0 refills | Status: DC

## 2021-07-31 MED ORDER — AMLODIPINE BESYLATE 2.5 MG PO TABS: 2.5000 mg | ORAL_TABLET | Freq: Every day | ORAL | 0 refills | Status: DC

## 2021-07-31 NOTE — Patient Instructions (Addendum)
Take prozac 40mg  daily for your anxiety and depression.   Please get your diabetic eye exam done.  Take amlodipine 2.5 mg for your blood pressure, in addition to your other blood pressure medication. Check your blood pressure 3 times weekly please write down the number and bring to your next appointment.   Get your shingles and flu vaccine today.  Please call your endocrinologist office to schedule appointment for your diabetes,   It is important that you exercise regularly at least 30 minutes 5 times a week.  Think about what you will eat, plan ahead. Choose " clean, green, fresh or frozen" over canned, processed or packaged foods which are more sugary, salty and fatty. 70 to 75% of food eaten should be vegetables and fruit. Three meals at set times with snacks allowed between meals, but they must be fruit or vegetables. Aim to eat over a 12 hour period , example 7 am to 7 pm, and STOP after  your last meal of the day. Drink water,generally about 64 ounces per day, no other drink is as healthy. Fruit juice is best enjoyed in a healthy way, by EATING the fruit.  Thanks for choosing [facility], we consider it a privelige to serve you.

## 2021-07-31 NOTE — Progress Notes (Signed)
° °  Shawn Velazquez     MRN: [id number]      DOB: 1966-06-19   HPI Mr. Shawn Velazquez is here for follow up and re-evaluation of chronic medical conditions, medication management and review of any available recent lab and radiology data.  Preventive health is updated, specifically  Cancer screening and Immunization.   Questions or concerns regarding consultations or procedures which the PT has had in the interim are  addressed. The PT denies any adverse reactions to current medications since the last visit.  There are no new concerns.  There are no specific complaints   Pt has not followed up with endo for his DM.     ROS Denies recent fever or chills. Denies sinus pressure, nasal congestion, ear pain or sore throat. Denies chest congestion, productive cough or wheezing. Denies chest pains, palpitations and leg swelling Denies abdominal pain, nausea, vomiting,diarrhea or constipation.   Denies dysuria, frequency, hesitancy or incontinence. Denies joint pain, swelling and limitation in mobility. Denies headaches, seizures, numbness, or tingling. Has depression, anxiety  .       PE  BP (!) 178/100    Pulse 83    Resp 16    Ht 5\' 6"  (1.676 m)    Wt 200 lb 1.9 oz (90.8 kg)    SpO2 95%    BMI 32.30 kg/m   Patient alert and oriented and in no cardiopulmonary distress.  Chest: Clear to auscultation bilaterally.  CVS: S1, S2 no murmurs, no S3.Regular rate.  ABD: Soft non tender.   Ext: No edema  MS: Adequate ROM spine, shoulders, hips and knees.  Skin: Intact, no ulcerations or rash noted.  Psych: Good eye contact, normal affect. Memory intact not depressed appearing appears anxious     Assessment & Plan

## 2021-08-01 ENCOUNTER — Other Ambulatory Visit: Payer: Self-pay | Admitting: Nurse Practitioner

## 2021-08-01 DIAGNOSIS — F419 Anxiety disorder, unspecified: Secondary | ICD-10-CM | POA: Insufficient documentation

## 2021-08-01 DIAGNOSIS — F32A Depression, unspecified: Secondary | ICD-10-CM | POA: Insufficient documentation

## 2021-08-01 DIAGNOSIS — R748 Abnormal levels of other serum enzymes: Secondary | ICD-10-CM

## 2021-08-01 LAB — CMP14+EGFR
ALT: 60 IU/L — ABNORMAL HIGH (ref 0–44)
AST: 95 IU/L — ABNORMAL HIGH (ref 0–40)
Albumin/Globulin Ratio: 1.4 (ref 1.2–2.2)
Albumin: 4.6 g/dL (ref 3.8–4.9)
Alkaline Phosphatase: 243 IU/L — ABNORMAL HIGH (ref 44–121)
BUN/Creatinine Ratio: 10 (ref 9–20)
BUN: 12 mg/dL (ref 6–24)
Bilirubin Total: 1.5 mg/dL — ABNORMAL HIGH (ref 0.0–1.2)
CO2: 28 mmol/L (ref 20–29)
Calcium: 10.4 mg/dL — ABNORMAL HIGH (ref 8.7–10.2)
Chloride: 100 mmol/L (ref 96–106)
Creatinine, Ser: 1.25 mg/dL (ref 0.76–1.27)
Globulin, Total: 3.3 g/dL (ref 1.5–4.5)
Glucose: 158 mg/dL — ABNORMAL HIGH (ref 70–99)
Potassium: 4.3 mmol/L (ref 3.5–5.2)
Sodium: 141 mmol/L (ref 134–144)
Total Protein: 7.9 g/dL (ref 6.0–8.5)
eGFR: 67 mL/min/{1.73_m2} (ref >59)

## 2021-08-01 LAB — CBC WITH DIFFERENTIAL/PLATELET
Basophils Absolute: 0.1 10*3/uL (ref 0.0–0.2)
Basos: 1 %
EOS (ABSOLUTE): 0.2 10*3/uL (ref 0.0–0.4)
Eos: 2 %
Hematocrit: 42.4 % (ref 37.5–51.0)
Hemoglobin: 14.4 g/dL (ref 13.0–17.7)
Immature Grans (Abs): 0 10*3/uL (ref 0.0–0.1)
Immature Granulocytes: 0 %
Lymphocytes Absolute: 1.6 10*3/uL (ref 0.7–3.1)
Lymphs: 17 %
MCH: 33 pg (ref 26.6–33.0)
MCHC: 34 g/dL (ref 31.5–35.7)
MCV: 97 fL (ref 79–97)
Monocytes Absolute: 0.6 10*3/uL (ref 0.1–0.9)
Monocytes: 7 %
Neutrophils Absolute: 6.6 10*3/uL (ref 1.4–7.0)
Neutrophils: 73 %
Platelets: 146 10*3/uL — ABNORMAL LOW (ref 150–450)
RBC: 4.37 x10E6/uL (ref 4.14–5.80)
RDW: 11.8 % (ref 11.6–15.4)
WBC: 9.1 10*3/uL (ref 3.4–10.8)

## 2021-08-01 LAB — LIPID PANEL
Chol/HDL Ratio: 2.3 ratio (ref 0.0–5.0)
Cholesterol, Total: 130 mg/dL (ref 100–199)
HDL: 56 mg/dL (ref >39)
LDL Chol Calc (NIH): 59 mg/dL (ref 0–99)
Triglycerides: 78 mg/dL (ref 0–149)
VLDL Cholesterol Cal: 15 mg/dL (ref 5–40)

## 2021-08-01 LAB — HEMOGLOBIN A1C
Est. average glucose Bld gHb Est-mCnc: 235 mg/dL
Hgb A1c MFr Bld: 9.8 % — ABNORMAL HIGH (ref 4.8–5.6)

## 2021-08-01 NOTE — Assessment & Plan Note (Addendum)
GAD 7; 21, PHQ 9 ; 16. Takes prozac 30mg  daily, pt told to increase dose to 40mg  daily Continue lorazepam 1mg  TID PRN Pt refered to psych for counseling and med management, denies SI, HI.  follow up in 4 weeks

## 2021-08-01 NOTE — Assessment & Plan Note (Signed)
Check lipid panel.  Takes atorvastatin 40 mg daily

## 2021-08-01 NOTE — Assessment & Plan Note (Signed)
Lab Results  Component Value Date   HGBA1C 9.8 (H) 07/31/2021  pt advised to follow up with endo.  Need for regular follow up with edno discussed with pt he verbalized understanding.  Pt stated that he will get his diabetic eye exam done at his opthamologist office. Foot exam completed today.

## 2021-08-01 NOTE — Progress Notes (Signed)
Pls review labs with pt.  Pt should follow up with edno as discussed for management of his DM. Lipid level is normal. Liver enzymes are elevated, pt should avoid,tylenol,  alcohol to avoid liver damage he has been referred to GI.    Pt should follw up with me for his BP as planned.  Thanks

## 2021-08-01 NOTE — Assessment & Plan Note (Addendum)
DASH diet and commitment to daily physical activity for a minimum of 30 minutes discussed and encouraged, as a part of hypertension management. The importance of attaining a healthy weight is also discussed.  BP/Weight 07/31/2021 02/05/2021 11/21/2020 10/29/2020 10/15/2020 07/15/2020 16/57/9038  Systolic BP 333 832 919 166 060 045 997  Diastolic BP 741 80 92 98 70 87 100  Wt. (Lbs) 200.12 202 198 194.8 194 200 203  BMI 32.3 33.1 33.99 31.44 31.31 32.28 32.77  takes HCTZ 25mg  daily, losartan 50mg  daily. States that he takes both med daily BP not at goal , start amlodipine 2.5mg  daily, follow up in 4 weeks.  Denies HA, dizziness, syncope, cp

## 2021-08-04 ENCOUNTER — Encounter: Payer: Self-pay | Admitting: Internal Medicine

## 2021-08-04 ENCOUNTER — Telehealth: Payer: Self-pay | Admitting: *Deleted

## 2021-08-04 NOTE — Chronic Care Management (AMB) (Signed)
°  Care Management   Outreach Note  08/04/2021 Name: Shawn Velazquez MRN: [id number] DOB: [birth year]  Referred by: Renee Rival, FNP Reason for referral : Care Coordination (Initial outreach to schedule referral with SW )   An unsuccessful telephone outreach was attempted today. The patient was referred to the case management team for assistance with care management and care coordination.   Follow Up Plan:  A HIPAA compliant phone message was left for the patient providing contact information and requesting a return call. The care management team will reach out to the patient again over the next 7 days. If patient returns call to provider office, please advise to call [place] at [phone].  [facility]  Direct Dial: [phone]

## 2021-08-05 ENCOUNTER — Emergency Department (HOSPITAL_COMMUNITY): Payer: BC Managed Care – PPO

## 2021-08-05 ENCOUNTER — Encounter (HOSPITAL_COMMUNITY): Payer: Self-pay | Admitting: Emergency Medicine

## 2021-08-05 ENCOUNTER — Other Ambulatory Visit: Payer: Self-pay

## 2021-08-05 ENCOUNTER — Emergency Department (HOSPITAL_COMMUNITY)
Admission: EM | Admit: 2021-08-05 | Discharge: 2021-08-06 | Disposition: A | Discharge status: Home / Self Care | Payer: BC Managed Care – PPO | Attending: Emergency Medicine | Admitting: Emergency Medicine

## 2021-08-05 DIAGNOSIS — I10 Essential (primary) hypertension: Secondary | ICD-10-CM | POA: Diagnosis not present

## 2021-08-05 DIAGNOSIS — S060X1A Concussion with loss of consciousness of 30 minutes or less, initial encounter: Secondary | ICD-10-CM | POA: Diagnosis not present

## 2021-08-05 DIAGNOSIS — Y92481 Parking lot as the place of occurrence of the external cause: Secondary | ICD-10-CM | POA: Diagnosis not present

## 2021-08-05 DIAGNOSIS — E119 Type 2 diabetes mellitus without complications: Secondary | ICD-10-CM | POA: Insufficient documentation

## 2021-08-05 DIAGNOSIS — Z794 Long term (current) use of insulin: Secondary | ICD-10-CM | POA: Insufficient documentation

## 2021-08-05 DIAGNOSIS — Z23 Encounter for immunization: Secondary | ICD-10-CM | POA: Insufficient documentation

## 2021-08-05 DIAGNOSIS — Y9301 Activity, walking, marching and hiking: Secondary | ICD-10-CM | POA: Diagnosis not present

## 2021-08-05 DIAGNOSIS — Z7982 Long term (current) use of aspirin: Secondary | ICD-10-CM | POA: Diagnosis not present

## 2021-08-05 DIAGNOSIS — W19XXXA Unspecified fall, initial encounter: Secondary | ICD-10-CM | POA: Insufficient documentation

## 2021-08-05 DIAGNOSIS — Z79899 Other long term (current) drug therapy: Secondary | ICD-10-CM | POA: Diagnosis not present

## 2021-08-05 DIAGNOSIS — Z7984 Long term (current) use of oral hypoglycemic drugs: Secondary | ICD-10-CM | POA: Diagnosis not present

## 2021-08-05 DIAGNOSIS — S0990XA Unspecified injury of head, initial encounter: Secondary | ICD-10-CM | POA: Diagnosis not present

## 2021-08-05 DIAGNOSIS — S161XXA Strain of muscle, fascia and tendon at neck level, initial encounter: Secondary | ICD-10-CM | POA: Insufficient documentation

## 2021-08-05 DIAGNOSIS — S0003XA Contusion of scalp, initial encounter: Secondary | ICD-10-CM | POA: Diagnosis not present

## 2021-08-05 DIAGNOSIS — M4312 Spondylolisthesis, cervical region: Secondary | ICD-10-CM | POA: Diagnosis not present

## 2021-08-05 MED ORDER — TETANUS-DIPHTH-ACELL PERTUSSIS 5-2.5-18.5 LF-MCG/0.5 IM SUSY
0.5000 mL | PREFILLED_SYRINGE | Freq: Once | INTRAMUSCULAR | Status: AC
  Administered 2021-08-06: 0.5 mL via INTRAMUSCULAR
  Filled 2021-08-05: qty 0.5

## 2021-08-05 NOTE — ED Triage Notes (Signed)
Pt drank too much at dinner and fell in parking lot at [employer]. Pt has large hemotoma to the back of the head. The fall was not witnessed.

## 2021-08-05 NOTE — ED Provider Notes (Signed)
[facility] EMERGENCY DEPARTMENT Provider Note   CSN: [id number] Arrival date & time: 08/05/21  1918     History  Chief Complaint  Patient presents with   Shawn Velazquez is a 56 y.o. male.  The history is provided by the patient.  Fall This is a new problem. The problem occurs constantly. The problem has not changed since onset.Associated symptoms include headaches. Pertinent negatives include no chest pain and no abdominal pain. Nothing aggravates the symptoms. Nothing relieves the symptoms.  Patient presents after a fall.  Patient has history of alcohol use disorder and was walking in a parking lot when he fell.  This happened several hours ago.  He reports he lost consciousness.  He now has headache and neck pain.  No vomiting.  He takes aspirin, but no other anticoagulation He denies any other acute injuries   Past Medical History:  Diagnosis Date   Anxiety    Arthritis    affects hands, shoulder, neck, knees, hips, ankles, toes   Chronic back pain    Diabetes mellitus    diet controlled   Eczema    Fatty liver, alcoholic    GERD (gastroesophageal reflux disease)    Glaucoma    slight case   Hypertension    Psoriasis    Sleep apnea    Substance abuse ([place])    Vertigo 09/11/2017   Wears glasses     Home Medications Prior to Admission medications   Medication Sig Start Date End Date Taking? Authorizing Provider  albuterol (VENTOLIN HFA) 108 (90 Base) MCG/ACT inhaler INHALE 2 PUFFS INTO THE LUNGS EVERY 6 HOURS AS NEEDED FOR WHEEZING OR SHORTNESS OF BREATH 05/18/21   Lindell Spar, MD  amLODipine (NORVASC) 2.5 MG tablet Take 1 tablet (2.5 mg total) by mouth daily. 07/31/21   Paseda, Dewaine Conger, FNP  atorvastatin (LIPITOR) 40 MG tablet Take 1 tablet (40 mg total) by mouth every morning. 02/05/21   Noreene Larsson, NP  FLUoxetine (PROZAC) 10 MG capsule Take 4 tablets daily making  40mg  daily 07/31/21   Paseda, Dewaine Conger, FNP  hydrochlorothiazide (HYDRODIURIL)  25 MG tablet Take 1 tablet (25 mg total) by mouth daily. 02/05/21   Noreene Larsson, NP  ibuprofen (ADVIL) 800 MG tablet Take 1 tablet (800 mg total) by mouth every 8 (eight) hours as needed for moderate pain. For pain 03/19/21   Noreene Larsson, NP  insulin glargine (LANTUS SOLOSTAR) 100 UNIT/ML Solostar Pen Inject 22 Units into the skin daily. 02/05/21   Noreene Larsson, NP  insulin lispro (HUMALOG KWIKPEN) 100 UNIT/ML KwikPen Inject 5-11 Units into the skin 3 (three) times daily. 02/05/21   Noreene Larsson, NP  Insulin Pen Needle (PEN NEEDLES) 32G X 4 MM MISC 1 each by Does not apply route in the morning, at noon, in the evening, and at bedtime. 10/15/20   Noreene Larsson, NP  LORazepam (ATIVAN) 1 MG tablet TAKE 1 TABLET BY MOUTH THREE TIMES DAILY 07/22/21   Lindell Spar, MD  losartan (COZAAR) 50 MG tablet Take 1 tablet (50 mg total) by mouth daily. 02/05/21   Noreene Larsson, NP  meclizine (ANTIVERT) 25 MG tablet Take 25 mg by mouth 3 (three) times daily as needed for dizziness.    [provider]  metFORMIN (GLUCOPHAGE-XR) 500 MG 24 hr tablet Take 500 mg by mouth daily with breakfast.    [provider]  montelukast (SINGULAIR) 10 MG tablet Take  1 tablet (10 mg total) by mouth at bedtime. 07/17/21   Fayrene Helper, MD  tiZANidine (ZANAFLEX) 2 MG tablet SMARTSIG:1 Tablet(s) By Mouth 1 to 3 Times Daily PRN 09/02/20   [provider]      Allergies    Neosporin + pain relief max st [neomy-bacit-polymyx-pramoxine]    Review of Systems   Review of Systems  Constitutional:  Negative for fever.  Cardiovascular:  Negative for chest pain.  Gastrointestinal:  Negative for abdominal pain.  Musculoskeletal:  Positive for neck pain.  Neurological:  Positive for headaches.   Physical Exam Updated Vital Signs BP 139/85 (BP Location: Left Arm)    Pulse 91    Temp (!) 97.5 F (36.4 C) (Oral)    Resp 17    Ht 1.676 m (5\' 6" )    Wt 93.9 kg    SpO2 98%    BMI 33.41 kg/m  Physical  Exam CONSTITUTIONAL: Disheveled, no acute distress HEAD: Hematoma noted to posterior scalp, no signs of trauma EYES: EOMI/PERRL ENMT: Mucous membranes moist NECK: supple no meningeal signs SPINE/BACK: Mild tenderness noted cervical spine, no thoracic or lumbar tenderness, no bruising/crepitance/stepoffs noted to spine CV: S1/S2 noted, no murmurs/rubs/gallops noted LUNGS: Lungs are clear to auscultation bilaterally, no apparent distress ABDOMEN: soft NEURO: Pt is awake/alert/appropriate, moves all extremitiesx4.  No facial droop.  Patient is ambulatory No arm or leg drift.  appropriate strength is noted in upper lower extremities EXTREMITIES: pulses normal/equal, full ROM, no deformities SKIN: warm, color normal PSYCH: no abnormalities of mood noted, alert and oriented to situation  ED Results / Procedures / Treatments   Labs (all labs ordered are listed, but only abnormal results are displayed) Labs Reviewed - No data to display  EKG None  Radiology CT Head Wo Contrast  Result Date: 08/05/2021 CLINICAL DATA:  Pt drank too much at dinner and fell in parking lot at restaurant. Pt has large hematoma to the back of the head. Initial encounter EXAM: CT HEAD WITHOUT CONTRAST CT CERVICAL SPINE WITHOUT CONTRAST TECHNIQUE: Multidetector CT imaging of the head and cervical spine was performed following the standard protocol without intravenous contrast. Multiplanar CT image reconstructions of the cervical spine were also generated. RADIATION DOSE REDUCTION: This exam was performed according to the departmental dose-optimization program which includes automated exposure control, adjustment of the mA and/or kV according to patient size and/or use of iterative reconstruction technique. COMPARISON:  None. FINDINGS: CT HEAD FINDINGS Brain: No evidence of acute infarction, hemorrhage, hydrocephalus, extra-axial collection or mass lesion/mass effect. Vascular: No hyperdense vessel or unexpected  calcification. Skull: Normal. Negative for fracture or focal lesion. Sinuses/Orbits: No acute finding. Other: Large scalp hematoma is noted posteriorly on the left consistent with the recent injury. CT CERVICAL SPINE FINDINGS Alignment: Loss of the normal cervical lordosis is noted. Mild degenerative anterolisthesis of C4 on C5 is noted. Skull base and vertebrae: 7 cervical segments are well visualized. Multilevel disc space narrowing is noted from C5 to T1 with osteophytic changes. Facet hypertrophic changes are noted worst at C4-5 with the resultant anterolisthesis. The odontoid is within normal limits. No acute fracture or acute facet abnormality is noted. Soft tissues and spinal canal: Surrounding soft tissue structures show vascular calcifications. Upper chest: Visualized lung apices are within normal limits. Other: None IMPRESSION: CT of the head: No acute intracranial abnormality noted. Large posterior scalp hematoma on the left related to the recent injury. CT of the cervical spine: Degenerative changes with anterolisthesis of C4 on C5.  No acute abnormality is noted. Electronically Signed   By: Inez Catalina M.D.   On: 08/05/2021 23:59   CT Cervical Spine Wo Contrast  Result Date: 08/05/2021 CLINICAL DATA:  Pt drank too much at dinner and fell in parking lot at restaurant. Pt has large hematoma to the back of the head. Initial encounter EXAM: CT HEAD WITHOUT CONTRAST CT CERVICAL SPINE WITHOUT CONTRAST TECHNIQUE: Multidetector CT imaging of the head and cervical spine was performed following the standard protocol without intravenous contrast. Multiplanar CT image reconstructions of the cervical spine were also generated. RADIATION DOSE REDUCTION: This exam was performed according to the departmental dose-optimization program which includes automated exposure control, adjustment of the mA and/or kV according to patient size and/or use of iterative reconstruction technique. COMPARISON:  None. FINDINGS: CT  HEAD FINDINGS Brain: No evidence of acute infarction, hemorrhage, hydrocephalus, extra-axial collection or mass lesion/mass effect. Vascular: No hyperdense vessel or unexpected calcification. Skull: Normal. Negative for fracture or focal lesion. Sinuses/Orbits: No acute finding. Other: Large scalp hematoma is noted posteriorly on the left consistent with the recent injury. CT CERVICAL SPINE FINDINGS Alignment: Loss of the normal cervical lordosis is noted. Mild degenerative anterolisthesis of C4 on C5 is noted. Skull base and vertebrae: 7 cervical segments are well visualized. Multilevel disc space narrowing is noted from C5 to T1 with osteophytic changes. Facet hypertrophic changes are noted worst at C4-5 with the resultant anterolisthesis. The odontoid is within normal limits. No acute fracture or acute facet abnormality is noted. Soft tissues and spinal canal: Surrounding soft tissue structures show vascular calcifications. Upper chest: Visualized lung apices are within normal limits. Other: None IMPRESSION: CT of the head: No acute intracranial abnormality noted. Large posterior scalp hematoma on the left related to the recent injury. CT of the cervical spine: Degenerative changes with anterolisthesis of C4 on C5. No acute abnormality is noted. Electronically Signed   By: Inez Catalina M.D.   On: 08/05/2021 23:59    Procedures Procedures    Medications Ordered in ED Medications  Tdap (BOOSTRIX) injection 0.5 mL (has no administration in time range)    ED Course/ Medical Decision Making/ A&P         Glasgow Coma Scale Score: 15      NEXUS Criteria Score: 2            Medical Decision Making Amount and/or Complexity of Data Reviewed Radiology: ordered.  Risk Prescription drug management.   This patient presents to the ED for concern of fall with head and cervical spine trauma, this involves an extensive number of treatment options, and is a complaint that carries with it a high risk of  complications and morbidity.  The differential diagnosis includes subarachnoid hemorrhage, subdural hematoma, skull fracture, cervical spine fracture  Comorbidities that complicate the patient evaluation: Patients presentation is complicated by their history of alcohol use, diabetes  Social Determinants of Health: Patients alcohol use increases the complexity of managing their presentation  Additional history obtained: Additional history obtained from family   Imaging Studies ordered: I ordered imaging studies including CT scan head and C-spine I independently visualized and interpreted imaging which showed no acute brain injury, no acute cervical fracture I agree with the radiologist interpretation   Medicines ordered and prescription drug management: I ordered medication including tetanus for scalp abrasion    Reevaluation: After the interventions noted above, I reevaluated the patient and found that they have :improved  Complexity of problems addressed: Patients presentation is most consistent  with  acute complicated illness/injury requiring diagnostic workup      Disposition: After consideration of the diagnostic results and the patients response to treatment,  I feel that the patent would benefit from discharge .   Patient presented after a fall after drinking alcohol.  He had signs of head injury, and also had neck tenderness.  All imaging is negative for acute process.  No signs of any occult cervical spine injury, patient has no focal weakness. No other signs of acute traumatic injury         Final Clinical Impression(s) / ED Diagnoses Final diagnoses:  Fall, initial encounter  Concussion with loss of consciousness of 30 minutes or less, initial encounter  Strain of neck muscle, initial encounter    Rx / DC Orders ED Discharge Orders     None         Ripley Fraise, MD 08/06/21 0011

## 2021-08-06 NOTE — Discharge Instructions (Signed)
You have neck pain, possibly from a cervical strain and/or pinched nerve.  ° °SEEK IMMEDIATE MEDICAL ATTENTION IF: °You develop difficulties swallowing or breathing.  °You have new or worse numbness, weakness, tingling, or movement problems in your arms or legs.  °You develop increasing pain which is uncontrolled with medications.  °You have change in bowel or bladder function, or other concerns. ° ° ° °

## 2021-08-10 ENCOUNTER — Telehealth: Payer: Self-pay | Admitting: Nurse Practitioner

## 2021-08-10 NOTE — Chronic Care Management (AMB) (Signed)
°  Care Management   Note  08/10/2021 Name: JONAH GINGRAS MRN: [id number] DOB: [birth year]  Shirlean Mylar is a 56 y.o. year old male who is a primary care patient of Renee Rival, FNP. I reached out to Shirlean Mylar by phone today in response to a referral sent by Mr. Phat Dalton Carrero's primary care provider.   Mr. Alpern was given information about care management services today including:  Care management services include personalized support from designated clinical staff supervised by his physician, including individualized plan of care and coordination with other care providers 24/7 contact phone numbers for assistance for urgent and routine care needs. The patient may stop care management services at any time by phone call to the office staff.  Patient agreed to services and verbal consent obtained.   Follow up plan: Face to Face appointment with care management team member scheduled for: 09/25/21  [place] [facility]  Direct Dial: [phone]

## 2021-08-10 NOTE — Telephone Encounter (Signed)
Pt returning call for lab results  

## 2021-08-11 NOTE — Telephone Encounter (Signed)
Copy of results mailed to pt per [facility]

## 2021-08-13 ENCOUNTER — Ambulatory Visit: Payer: BC Managed Care – PPO | Admitting: Nurse Practitioner

## 2021-08-28 ENCOUNTER — Encounter: Payer: Self-pay | Admitting: Nurse Practitioner

## 2021-08-28 ENCOUNTER — Ambulatory Visit: Payer: BC Managed Care – PPO | Admitting: Nurse Practitioner

## 2021-08-28 ENCOUNTER — Other Ambulatory Visit: Payer: Self-pay

## 2021-08-28 DIAGNOSIS — Z794 Long term (current) use of insulin: Secondary | ICD-10-CM | POA: Diagnosis not present

## 2021-08-28 DIAGNOSIS — I10 Essential (primary) hypertension: Secondary | ICD-10-CM

## 2021-08-28 DIAGNOSIS — E1165 Type 2 diabetes mellitus with hyperglycemia: Secondary | ICD-10-CM

## 2021-08-28 MED ORDER — HYDROCHLOROTHIAZIDE 25 MG PO TABS: 25.0000 mg | ORAL_TABLET | Freq: Every day | ORAL | 1 refills | Status: DC

## 2021-08-28 MED ORDER — LOSARTAN POTASSIUM 50 MG PO TABS: 50.0000 mg | ORAL_TABLET | Freq: Every day | ORAL | 1 refills | Status: DC

## 2021-08-28 MED ORDER — AMLODIPINE BESYLATE 2.5 MG PO TABS: ORAL_TABLET | ORAL | 0 refills | Status: DC

## 2021-08-28 MED ORDER — METFORMIN HCL ER 500 MG PO TB24: ORAL_TABLET | ORAL | 2 refills | Status: DC

## 2021-08-28 NOTE — Addendum Note (Signed)
Addended by: Renee Rival on: 08/28/2021 02:22 PM   Modules accepted: Orders

## 2021-08-28 NOTE — Assessment & Plan Note (Addendum)
BP Readings from Last 3 Encounters:  08/28/21 (!) 160/90  08/06/21 129/76  07/31/21 (!) 178/100  Uncontrolled condition, started on amlodipine 2.5 mg at his last visit 4 weeks ago. Patient states that he has been taking amlodipine 2.5 mg daily, hydrochlorothiazide 25 mg daily, losartan 50 mg daily for his blood pressure.   Patient states that he has been checking his blood pressure at home, patient states that he did not bring the blood pressure log that was given to him. increase amlodipine to 5mg  daily , was previously taking amlodipine 2.5mg  daily.  Amlodipine increased to 5 mg today. Start amlodipine 5 mg daily continue losartan 50 mg daily, hydrochlorothiazide 25 mg daily.  Medications refilled today   Dash diet discussed, patient encouraged to exercise 30 minutes 5 times daily. Follow-up in 4 weeks to recheck blood pressure. Patient told to check blood pressure 3 times weekly record blood pressure and bring log to next follow-up appointment.

## 2021-08-28 NOTE — Progress Notes (Signed)
° °  Shawn Velazquez     MRN: [id number]      DOB: [birth year]   HPI Shawn Velazquez with medical history significant for hypertension, uncontrolled type 2 diabetes, anxiety, hyperlipidemia presents for follow-up for his hypertension.  Patient states that he has been taking amlodipine 2.5 mg daily, hydrochlorothiazide 25 mg daily, losartan 50 mg daily for his blood pressure.  Patient states that he has been checking his blood pressure at home, patient states that he did not bring the blood pressure log that was given to him.   Patient states that he has not called endocrinology office to schedule his follow-up appointment.   ROS Denies recent fever or chills. Denies sinus pressure, nasal congestion, ear pain or sore throat. Denies chest congestion, productive cough or wheezing. Denies chest pains, palpitations and leg swelling Denies abdominal pain, nausea, vomiting,diarrhea or constipation.   Denies dysuria, frequency, hesitancy or incontinence. Denies depression, anxiety or insomnia.    PE  BP (!) 162/85    Pulse 78    Ht 5\' 6"  (1.676 m)    Wt 203 lb 1.3 oz (92.1 kg)    SpO2 97%    BMI 32.78 kg/m   Patient alert and oriented and in no cardiopulmonary distress.  Chest: Clear to auscultation bilaterally.  CVS: S1, S2 no murmurs, no S3.Regular rate.  ABD: Soft non tender.   MS: Adequate ROM spine, shoulders, hips and knees.  Psych: Good eye contact, normal affect. Memory intact not anxious or depressed appearing.   Assessment & Plan

## 2021-08-28 NOTE — Patient Instructions (Signed)
Please start taking amlodipine 5mg  daily, check your blood pressure three times  at home, write down your readings and bring to you follow up appointment in 4 weeks   It is very important that you  call the endocrinologist office to schedule your follow up for Diabetes.    It is important that you exercise regularly at least 30 minutes 5 times a week.  Think about what you will eat, plan ahead. Choose " clean, green, fresh or frozen" over canned, processed or packaged foods which are more sugary, salty and fatty. 70 to 75% of food eaten should be vegetables and fruit. Three meals at set times with snacks allowed between meals, but they must be fruit or vegetables. Aim to eat over a 12 hour period , example 7 am to 7 pm, and STOP after  your last meal of the day. Drink water,generally about 64 ounces per day, no other drink is as healthy. Fruit juice is best enjoyed in a healthy way, by EATING the fruit.  Thanks for choosing [facility], we consider it a privelige to serve you.

## 2021-08-28 NOTE — Assessment & Plan Note (Addendum)
Uncontrolled diabetes, followed by endocrinologist, last visit to Endo was 9 months ago.  Takes Lantus 22 units daily, Humalog 5 to 11 units 3 times daily, metformin 500 mg 24-hour tablets dail Lab Results  Component Value Date   HGBA1C 9.8 (H) 07/31/2021   During his last visit about 4 weeks ago patient was encouraged to call endocrinologist for follow-up for uncontrolled diabetes, up tIill now patient has not call the endocrinology to schedule a follow-up appointment.  The need to call the endocrinologist office discussed with patient today, patient encouraged to call the endocrinologist office today and schedule an appointment for his type 2 diabetes he verbalized understanding.   Patient encouraged to avoid sweet sugar soda, eat a healthy diet consisting mainly of vegetables and protein drink plenty of water,  engage in vigorous exercise 30 minutes 5 times a week, lose weight  Pt told to start taking metformin -xr 1000 mg daily , continue lantus 22 units daily, humalog 5-11 units  TID and follow up with endo

## 2021-08-29 ENCOUNTER — Other Ambulatory Visit: Payer: Self-pay | Admitting: Internal Medicine

## 2021-08-31 ENCOUNTER — Other Ambulatory Visit: Payer: Self-pay | Admitting: Nurse Practitioner

## 2021-08-31 MED ORDER — LORAZEPAM 1 MG PO TABS: 1.0000 mg | ORAL_TABLET | Freq: Two times a day (BID) | ORAL | 0 refills | Status: DC | PRN

## 2021-08-31 NOTE — Telephone Encounter (Signed)
LVM for pt to call the office.

## 2021-09-22 NOTE — Progress Notes (Deleted)
? ?Referring Provider:Paseda, Dewaine Conger, FNP ?Primary Care Physician:  Renee Rival, FNP ?Primary Gastroenterologist:  Dr. Abbey Chatters ? ?No chief complaint on file. ? ? ?HPI:   ?Shawn Velazquez is a 56 y.o. male presenting today at the request of Renee Rival, FNP for colon cancer screening and elevated liver enzymes. ? ?Per chart review, it appears patient has had elevated liver enzymes since 2015.  Most recent labs 07/31/2021 with total bilirubin 1.5, alk phos 243, AST 95, ALT 60.  ?Also with mild thrombocytopenia with platelets in the 140s. ?HCV antibody negative January 2022. ?No recent abdominal imaging on file. ?CT abdomen with contrast August 2015 with fatty liver. ? ?Today:  ? ? ?Past Medical History:  ?Diagnosis Date  ? Anxiety   ? Arthritis   ? affects hands, shoulder, neck, knees, hips, ankles, toes  ? Chronic back pain   ? Diabetes mellitus   ? diet controlled  ? Eczema   ? Fatty liver, alcoholic   ? GERD (gastroesophageal reflux disease)   ? Glaucoma   ? slight case  ? Hypertension   ? Psoriasis   ? Sleep apnea   ? Substance abuse ([place])   ? Vertigo 09/11/2017  ? Wears glasses   ? ? ?Past Surgical History:  ?Procedure Laterality Date  ? MULTIPLE TOOTH EXTRACTIONS    ? POSTERIOR CERVICAL LAMINECTOMY  04/21/2012  ? Procedure: POSTERIOR CERVICAL LAMINECTOMY;  Surgeon: Elaina Hoops, MD;  Location: [facility];  Service: Neurosurgery;  Laterality: Left;  Posterior Cervical laminectomy/foraminotomy and diskectomy, left cervical seven-thoracic one  ? ROTATOR CUFF REPAIR    ? THORACIC DISCECTOMY Left 04/03/2014  ? Procedure: Left Thoracic Seven to Eight, Thoracic Eight to Nine Thoracic Discectomy ;  Surgeon: Elaina Hoops, MD;  Location: [facility];  Service: Neurosurgery;  Laterality: Left;  ? ? ?Current Outpatient Medications  ?Medication Sig Dispense Refill  ? albuterol (VENTOLIN HFA) 108 (90 Base) MCG/ACT inhaler INHALE 2 PUFFS INTO THE LUNGS EVERY 6 HOURS AS NEEDED FOR WHEEZING OR SHORTNESS OF  BREATH 20.1 g 0  ? amLODipine (NORVASC) 2.5 MG tablet Take amlodipine 20m daily 90 tablet 0  ? atorvastatin (LIPITOR) 40 MG tablet Take 1 tablet (40 mg total) by mouth every morning. 90 tablet 1  ? FLUoxetine (PROZAC) 10 MG capsule Take 4 tablets daily making  418mdaily 270 capsule 0  ? hydrochlorothiazide (HYDRODIURIL) 25 MG tablet Take 1 tablet (25 mg total) by mouth daily. 90 tablet 1  ? ibuprofen (ADVIL) 800 MG tablet Take 1 tablet (800 mg total) by mouth every 8 (eight) hours as needed for moderate pain. For pain 270 tablet 0  ? insulin glargine (LANTUS SOLOSTAR) 100 UNIT/ML Solostar Pen Inject 22 Units into the skin daily. 30 mL 3  ? insulin lispro (HUMALOG KWIKPEN) 100 UNIT/ML KwikPen Inject 5-11 Units into the skin 3 (three) times daily. 15 mL 3  ? Insulin Pen Needle (PEN NEEDLES) 32G X 4 MM MISC 1 each by Does not apply route in the morning, at noon, in the evening, and at bedtime. 100 each 11  ? LORazepam (ATIVAN) 1 MG tablet Take 1 tablet (1 mg total) by mouth 2 (two) times daily as needed for anxiety. 60 tablet 0  ? losartan (COZAAR) 50 MG tablet Take 1 tablet (50 mg total) by mouth daily. 90 tablet 1  ? meclizine (ANTIVERT) 25 MG tablet Take 25 mg by mouth 3 (three) times daily as needed for dizziness.    ?  metFORMIN (GLUCOPHAGE-XR) 500 MG 24 hr tablet Take 1043m once daily 60 tablet 2  ? montelukast (SINGULAIR) 10 MG tablet Take 1 tablet (10 mg total) by mouth at bedtime. 30 tablet 3  ? tiZANidine (ZANAFLEX) 2 MG tablet SMARTSIG:1 Tablet(s) By Mouth 1 to 3 Times Daily PRN    ? ?No current facility-administered medications for this visit.  ? ? ?Allergies as of 09/24/2021 - Review Complete 08/28/2021  ?Allergen Reaction Noted  ? Neosporin + pain relief max st [neomy-bacit-polymyx-pramoxine] Rash 04/19/2012  ? ? ?Family History  ?Problem Relation Age of Onset  ? Diabetes Father   ? Cancer - Lung Father   ? Hypertension Other   ? Cancer - Lung Other   ? ? ?Social History  ? ?Socioeconomic History  ?  Marital status: Divorced  ?  Spouse name: Not on file  ? Number of children: Not on file  ? Years of education: Not on file  ? Highest education level: Not on file  ?Occupational History  ?  Comment: Truck to storage  ?Tobacco Use  ? Smoking status: Former  ?  Packs/day: 0.25  ?  Years: 40.00  ?  Pack years: 10.00  ?  Types: E-cigarettes, Cigarettes  ?  Quit date: 05/11/2021  ?  Years since quitting: 0.3  ? Smokeless tobacco: Never  ? Tobacco comments:  ?  pt trying to quit on his own  ?Vaping Use  ? Vaping Use: Former  ?Substance and Sexual Activity  ? Alcohol use: Yes  ?  Comment: Either Smirnoff Smash-4 per day or Fireball-10 airplane bottles in a day  ? Drug use: No  ? Sexual activity: Yes  ?  Birth control/protection: None  ?Other Topics Concern  ? Not on file  ?Social History Narrative  ? Not on file  ? ?Social Determinants of Health  ? ?Financial Resource Strain: Not on file  ?Food Insecurity: Not on file  ?Transportation Needs: Not on file  ?Physical Activity: Not on file  ?Stress: Not on file  ?Social Connections: Not on file  ?Intimate Partner Violence: Not on file  ? ? ?Review of Systems: ?Gen: Denies any fever, chills, fatigue, weight loss, lack of appetite.  ?CV: Denies chest pain, heart palpitations, peripheral edema, syncope.  ?Resp: Denies shortness of breath at rest or with exertion. Denies wheezing or cough.  ?GI: Denies dysphagia or odynophagia. Denies jaundice, hematemesis, fecal incontinence. ?GU : Denies urinary burning, urinary frequency, urinary hesitancy ?MS: Denies joint pain, muscle weakness, cramps, or limitation of movement.  ?Derm: Denies rash, itching, dry skin ?Psych: Denies depression, anxiety, memory loss, and confusion ?Heme: Denies bruising, bleeding, and enlarged lymph nodes. ? ?Physical Exam: ?There were no vitals taken for this visit. ?General:   Alert and oriented. Pleasant and cooperative. Well-nourished and well-developed.  ?Head:  Normocephalic and atraumatic. ?Eyes:   Without icterus, sclera clear and conjunctiva pink.  ?Ears:  Normal auditory acuity. ?Lungs:  Clear to auscultation bilaterally. No wheezes, rales, or rhonchi. No distress.  ?Heart:  S1, S2 present without murmurs appreciated.  ?Abdomen:  +BS, soft, non-tender and non-distended. No HSM noted. No guarding or rebound. No masses appreciated.  ?Rectal:  Deferred  ?Msk:  Symmetrical without gross deformities. Normal posture. ?Extremities:  Without edema. ?Neurologic:  Alert and  oriented x4;  grossly normal neurologically. ?Skin:  Intact without significant lesions or rashes. ?Psych:  Alert and cooperative. Normal mood and affect. ? ? ? ?Assessment:  ? ? ? ?Plan:  ?*** ? ? ?KJordan  Jodi Mourning, PA-C ?[facility] ?09/24/2021 ?  ?

## 2021-09-24 ENCOUNTER — Ambulatory Visit: Payer: BC Managed Care – PPO | Admitting: Gastroenterology

## 2021-09-25 ENCOUNTER — Other Ambulatory Visit: Payer: Self-pay | Admitting: Nurse Practitioner

## 2021-09-25 ENCOUNTER — Ambulatory Visit: Payer: BC Managed Care – PPO

## 2021-09-26 ENCOUNTER — Other Ambulatory Visit: Payer: Self-pay | Admitting: Nurse Practitioner

## 2021-09-26 MED ORDER — FLUOXETINE HCL 10 MG PO CAPS: ORAL_CAPSULE | ORAL | 0 refills | Status: DC

## 2021-10-07 ENCOUNTER — Telehealth: Payer: Self-pay | Admitting: *Deleted

## 2021-10-07 NOTE — Chronic Care Management (AMB) (Signed)
?  Care Management  ? ?Note ? ?10/07/2021 ?Name: Shawn Velazquez MRN: [id number] DOB: April 26, 1966 ? ?Shawn Velazquez is a 56 y.o. year old male who is a primary care patient of Renee Rival, FNP and is actively engaged with the care management team. I reached out to Shirlean Mylar by phone today to assist with re-scheduling an initial visit with the Licensed Clinical Social Worker ? ?Follow up plan: ?Unsuccessful telephone outreach attempt made. A HIPAA compliant phone message was left for the patient providing contact information and requesting a return call.  ?The care management team will reach out to the patient again over the next 14 days.  ?If patient returns call to provider office, please advise to call [place] at [phone]. ? ?Laverda Sorenson  ?Care Guide, Embedded Care Coordination ?[place]  Care Management  ?Direct Dial: [phone] ? ?

## 2021-10-09 ENCOUNTER — Ambulatory Visit: Payer: BC Managed Care – PPO | Admitting: Nurse Practitioner

## 2021-10-19 NOTE — Chronic Care Management (AMB) (Signed)
?  Care Management  ? ?Note ? ?10/19/2021 ?Name: TEVIN SHILLINGFORD MRN: [id number] DOB: [birth year] ? ?JOHATHAN PROVINCE is a 56 y.o. year old male who is a primary care patient of Renee Rival, FNP and is actively engaged with the care management team. I reached out to Shirlean Mylar by phone today to assist with re-scheduling an initial visit with the Licensed Clinical Social Worker ? ?Follow up plan: ?Unsuccessful telephone outreach attempt made. A HIPAA compliant phone message was left for the patient providing contact information and requesting a return call.  ?The care management team will reach out to the patient again over the next 7 days.  ?If patient returns call to provider office, please advise to call [place] at [phone]. ? ?Laverda Sorenson  ?Care Guide, Embedded Care Coordination ?[place]  Care Management  ?Direct Dial: [phone] ? ?

## 2021-10-27 NOTE — Chronic Care Management (AMB) (Signed)
?  Care Management  ? ?Note ? ?10/27/2021 ?Name: [name] MRN: [id number] DOB: 04-24-1966 ? ?Shawn Velazquez is a 56 y.o. year old male who is a primary care patient of Renee Rival, FNP and is actively engaged with the care management team. I reached out to Shirlean Mylar by phone today to assist with re-scheduling an initial visit with the Licensed Clinical Social Worker ? ?Follow up plan: ?A third unsuccessful telephone outreach attempt made. A HIPAA compliant phone message was left for the patient providing contact information and requesting a return call. Unable to make contact on outreach attempts x 3. PCP Renee Rival, FNP notified via routed documentation in medical record. We have been unable to make contact with the patient for follow up. The care management team is available to follow up with the patient after provider conversation with the patient regarding recommendation for care management engagement and subsequent re-referral to the care management team.  ? ?Laverda Sorenson  ?Care Guide, Embedded Care Coordination ?[place]  Care Management  ?Direct Dial: [phone] ? ?

## 2021-10-29 ENCOUNTER — Encounter: Payer: Self-pay | Admitting: Nurse Practitioner

## 2021-10-29 ENCOUNTER — Ambulatory Visit: Payer: BC Managed Care – PPO | Admitting: Nurse Practitioner

## 2021-10-29 VITALS — BP 129/82 | HR 96 | Ht 66.0 in | Wt 198.0 lb

## 2021-10-29 DIAGNOSIS — E782 Mixed hyperlipidemia: Secondary | ICD-10-CM

## 2021-10-29 DIAGNOSIS — Z1211 Encounter for screening for malignant neoplasm of colon: Secondary | ICD-10-CM | POA: Diagnosis not present

## 2021-10-29 DIAGNOSIS — E1165 Type 2 diabetes mellitus with hyperglycemia: Secondary | ICD-10-CM | POA: Diagnosis not present

## 2021-10-29 DIAGNOSIS — I10 Essential (primary) hypertension: Secondary | ICD-10-CM | POA: Diagnosis not present

## 2021-10-29 DIAGNOSIS — F1020 Alcohol dependence, uncomplicated: Secondary | ICD-10-CM

## 2021-10-29 DIAGNOSIS — Z23 Encounter for immunization: Secondary | ICD-10-CM

## 2021-10-29 DIAGNOSIS — Z794 Long term (current) use of insulin: Secondary | ICD-10-CM

## 2021-10-29 DIAGNOSIS — E669 Obesity, unspecified: Secondary | ICD-10-CM | POA: Insufficient documentation

## 2021-10-29 LAB — POCT GLYCOSYLATED HEMOGLOBIN (HGB A1C): Hemoglobin A1C: 11.3 % — AB (ref 4.0–5.6)

## 2021-10-29 MED ORDER — METFORMIN HCL ER 500 MG PO TB24: ORAL_TABLET | ORAL | 3 refills | Status: DC

## 2021-10-29 MED ORDER — AMLODIPINE BESYLATE 5 MG PO TABS: 5.0000 mg | ORAL_TABLET | Freq: Every day | ORAL | 1 refills | Status: DC

## 2021-10-29 MED ORDER — LANTUS SOLOSTAR 100 UNIT/ML ~~LOC~~ SOPN: 26.0000 [IU] | PEN_INJECTOR | Freq: Every day | SUBCUTANEOUS | 3 refills | Status: DC

## 2021-10-29 NOTE — Addendum Note (Signed)
Addended by: Jill Side on: 10/29/2021 03:55 PM ? ? Modules accepted: Orders ? ?

## 2021-10-29 NOTE — Assessment & Plan Note (Signed)
Wt Readings from Last 3 Encounters:  ?10/29/21 198 lb (89.8 kg)  ?08/28/21 203 lb 1.3 oz (92.1 kg)  ?08/05/21 207 lb (93.9 kg)  ?Need to increase intake of whole foods consisting mainly of vegetables protein less carbohydrate drinking water instead of juice, engaging in regular daily exercises at least 30 minutes daily importance of portion control also discussed with patient.  Benefits of maintaining healthy weight discussed with patient verbalized understanding. ?

## 2021-10-29 NOTE — Assessment & Plan Note (Signed)
On atorvastatin 40 mg daily ?Check lipid panel ?Avoid fried fatty foods ?

## 2021-10-29 NOTE — Progress Notes (Signed)
? ?Shawn Velazquez     MRN: [id number]      DOB: [birth year] ? ? ?HPI ?Shawn Velazquez with past medical history of essential hypertension, type 2 diabetes mellitus with hyperglycemia, anxiety and depression, hyperlipidemia is here for follow up for blood pressure and diabetes. ? ?Type 2 diabetes .  During patient's last visit patient was encouraged to establish care with endocrinology for management of Shawn uncontrolled type 2 diabetes patient promised that he was going to do so but up till  now patient has not established care with endocrinology, he stated that he had a dental procedure and had to cancel it please endocrinology appointment . he has been checking CBG twice daily AM 130's, PM 200, takes lantus 22 units at night, humalog sliding scale . Pt sates that he has tried to stay away from soda, drink juice once in a while.  States that he feels more thirsty and has been having urinary frequency.  Denies hypoglycemia.  Due for diabetic eye exam patient stated that he will schedule an eye exam with Shawn Velazquez ? ?Due for second dose of shingles vaccine. ? ? ?ROS ?Denies recent fever or chills. ?Denies sinus pressure, nasal congestion, ear pain or sore throat. ?Denies chest congestion, productive cough or wheezing. ?Denies chest pains, palpitations and leg swelling ?Denies abdominal pain, nausea, vomiting,diarrhea or constipation.   ?Denies dysuria,., hesitancy or incontinence. ?Denies joint pain, swelling and limitation in mobility. ?Denies headaches, seizures, numbness, or tingling. ?. ? ? ?PE ? ?BP 129/82 (BP Location: Right Arm, Patient Position: Sitting, Cuff Size: Large)   Pulse 96   Ht 5' 6"  (1.676 m)   Wt 198 lb (89.8 kg)   SpO2 95%   BMI 31.96 kg/m?  ? ?Patient alert and oriented and in no cardiopulmonary distress. ? ?Chest: Clear to auscultation bilaterally. ? ?CVS: S1, S2 no murmurs, no S3.Regular rate. ? ?ABD: Soft non tender.  ? ?Ext: No edema ? ?MS: Adequate ROM spine, shoulders, hips  and knees. ? ?Psych: Good eye contact, normal affect. Memory intact not anxious or depressed appearing. ? ?CNS: CN 2-12 intact, power,  normal throughout.no focal deficits noted. ? ? ?Assessment & Plan ?Uncontrolled type 2 diabetes mellitus with hyperglycemia ([place]) ?Lab Results  ?Component Value Date  ? HGBA1C 11.3 (A) 10/29/2021  ? ?Currently on metformin 1000 mg daily, Humalog sliding scale, Lantus 22 units daily. ?Patient was encouraged to follow-up with endocrinology during Shawn last office visit up to now patient has not done so.  Need to reestablish care with endocrinology discussed with patient risk of having stroke heart attack due to uncontrolled diabetes discussed with patient he verbalized understanding. ?Due for eye exam patient states that he will schedule this with Shawn Velazquez. ?On ACEI, statin ? ?Start Lantus 26 units daily at bedtime,, metformin 1000 units 2 times daily with meals ?Continue to check blood sugar at home, follow-up with endocrinology. ? ? ?Hyperlipidemia ?On atorvastatin 40 mg daily ?Check lipid panel ?Avoid fried fatty foods ? ?Hypertension, essential ?BP Readings from Last 3 Encounters:  ?10/29/21 129/82  ?08/28/21 (!) 150/88  ?08/06/21 129/76  ?Chronic condition well-controlled on amlodipine 5 mg daily, hydrochlorothiazide 25 mg daily, losartan 50 mg daily ?Continue current medication ?DASH diet advised engage in daily exercises at least 150 minutes weekly ?CMP plus EGFR ordered ? ?Obesity (BMI 30-39.9) ?Wt Readings from Last 3 Encounters:  ?10/29/21 198 lb (89.8 kg)  ?08/28/21 203 lb 1.3 oz (92.1 kg)  ?08/05/21 207 lb (93.9 kg)  ?Need  to increase intake of whole foods consisting mainly of vegetables protein less carbohydrate drinking water instead of juice, engaging in regular daily exercises at least 30 minutes daily importance of portion control also discussed with patient.  Benefits of maintaining healthy weight discussed with patient verbalized understanding. ? ?Alcohol  use disorder, moderate, dependence ([place]) ?Still drinking alcohol ?Need to quit drinking alcohol discussed with patient he verbalized understanding.  ? ?

## 2021-10-29 NOTE — Assessment & Plan Note (Addendum)
Lab Results  ?Component Value Date  ? HGBA1C 11.3 (A) 10/29/2021  ? ?Currently on metformin 1000 mg daily, Humalog sliding scale, Lantus 22 units daily. ?Patient was encouraged to follow-up with endocrinology during his last office visit up to now patient has not done so.  Need to reestablish care with endocrinology discussed with patient risk of having stroke heart attack due to uncontrolled diabetes discussed with patient he verbalized understanding. ?Due for eye exam patient states that he will schedule this with his ophthalmologist. ?On ACEI, statin ? ?Start Lantus 26 units daily at bedtime,, metformin 1000 units 2 times daily with meals ?Continue to check blood sugar at home, follow-up with endocrinology. ? ?

## 2021-10-29 NOTE — Assessment & Plan Note (Addendum)
BP Readings from Last 3 Encounters:  ?10/29/21 129/82  ?08/28/21 (!) 150/88  ?08/06/21 129/76  ?Chronic condition well-controlled on amlodipine 5 mg daily, hydrochlorothiazide 25 mg daily, losartan 50 mg daily ?Continue current medication ?DASH diet advised engage in daily exercises at least 150 minutes weekly ?CMP plus EGFR ordered ?

## 2021-10-29 NOTE — Assessment & Plan Note (Signed)
Still drinking alcohol ?Need to quit drinking alcohol discussed with patient he verbalized understanding. ?

## 2021-10-29 NOTE — Patient Instructions (Addendum)
[place]leas Start Lantus 26 units daily at bedtime,, metformin 1000 units 2 times daily with meals ?Continue to check blood sugar at home, follow-up with endocrinology. ?Goal for fasting blood sugar ranges from 80 to 120 and 2 hours after any meal or at bedtime should be between 130 to 170.  ? ?Please schedule fasting labs done as soon as possible ? ? ? ?It is important that you exercise regularly at least 30 minutes 5 times a week.  ?Think about what you will eat, plan ahead. ?Choose " clean, green, fresh or frozen" over canned, processed or packaged foods which are more sugary, salty and fatty. ?70 to 75% of food eaten should be vegetables and fruit. ?Three meals at set times with snacks allowed between meals, but they must be fruit or vegetables. ?Aim to eat over a 12 hour period , example 7 am to 7 pm, and STOP after  your last meal of the day. ?Drink water,generally about 64 ounces per day, no other drink is as healthy. Fruit juice is best enjoyed in a healthy way, by EATING the fruit. ? ?Thanks for choosing [facility], we consider it a privelige to serve you.  ?

## 2021-11-24 ENCOUNTER — Telehealth: Payer: Self-pay

## 2021-11-24 ENCOUNTER — Other Ambulatory Visit: Payer: Self-pay

## 2021-11-24 DIAGNOSIS — F1020 Alcohol dependence, uncomplicated: Secondary | ICD-10-CM | POA: Diagnosis not present

## 2021-11-24 DIAGNOSIS — E1165 Type 2 diabetes mellitus with hyperglycemia: Secondary | ICD-10-CM | POA: Diagnosis not present

## 2021-11-24 DIAGNOSIS — R748 Abnormal levels of other serum enzymes: Secondary | ICD-10-CM | POA: Diagnosis not present

## 2021-11-24 DIAGNOSIS — I10 Essential (primary) hypertension: Secondary | ICD-10-CM | POA: Diagnosis not present

## 2021-11-24 DIAGNOSIS — Z794 Long term (current) use of insulin: Secondary | ICD-10-CM | POA: Diagnosis not present

## 2021-11-24 DIAGNOSIS — E782 Mixed hyperlipidemia: Secondary | ICD-10-CM | POA: Diagnosis not present

## 2021-11-24 MED ORDER — INSULIN LISPRO (1 UNIT DIAL) 100 UNIT/ML (KWIKPEN): 5.0000 [IU] | PEN_INJECTOR | Freq: Three times a day (TID) | SUBCUTANEOUS | 3 refills | Status: DC

## 2021-11-24 NOTE — Telephone Encounter (Signed)
Pt presented in office to have labs drawn. Pt wants to know if he can get a refill on humalog, last refilled in august by [facility] with 3 refills. Please advise.

## 2021-11-24 NOTE — Telephone Encounter (Signed)
Spoke with pt refilled insulin advised to contact endo he states that he is scheduled for 6/7

## 2021-11-25 ENCOUNTER — Other Ambulatory Visit: Payer: Self-pay | Admitting: Nurse Practitioner

## 2021-11-25 DIAGNOSIS — R748 Abnormal levels of other serum enzymes: Secondary | ICD-10-CM

## 2021-11-25 DIAGNOSIS — F1020 Alcohol dependence, uncomplicated: Secondary | ICD-10-CM

## 2021-11-25 NOTE — Progress Notes (Signed)
I have sent in a referral to GI for his elevated liver enzymes. Pt should stop drinking alcohol. He should also stop taking atorvastatin '40mg'$  at this time due to his elevated liver enzymes. Thanks

## 2021-11-26 ENCOUNTER — Other Ambulatory Visit: Payer: Self-pay | Admitting: Nurse Practitioner

## 2021-11-26 DIAGNOSIS — R748 Abnormal levels of other serum enzymes: Secondary | ICD-10-CM

## 2021-11-26 DIAGNOSIS — F1020 Alcohol dependence, uncomplicated: Secondary | ICD-10-CM

## 2021-11-26 LAB — CMP14+EGFR
ALT: 64 IU/L — ABNORMAL HIGH (ref 0–44)
AST: 88 IU/L — ABNORMAL HIGH (ref 0–40)
Albumin/Globulin Ratio: 1.1 — ABNORMAL LOW (ref 1.2–2.2)
Albumin: 3.9 g/dL (ref 3.8–4.9)
Alkaline Phosphatase: 329 IU/L — ABNORMAL HIGH (ref 44–121)
BUN/Creatinine Ratio: 10 (ref 9–20)
BUN: 12 mg/dL (ref 6–24)
Bilirubin Total: 1.6 mg/dL — ABNORMAL HIGH (ref 0.0–1.2)
CO2: 26 mmol/L (ref 20–29)
Calcium: 10.1 mg/dL (ref 8.7–10.2)
Chloride: 92 mmol/L — ABNORMAL LOW (ref 96–106)
Creatinine, Ser: 1.18 mg/dL (ref 0.76–1.27)
Globulin, Total: 3.6 g/dL (ref 1.5–4.5)
Glucose: 371 mg/dL — ABNORMAL HIGH (ref 70–99)
Potassium: 3.6 mmol/L (ref 3.5–5.2)
Sodium: 135 mmol/L (ref 134–144)
Total Protein: 7.5 g/dL (ref 6.0–8.5)
eGFR: 72 mL/min/{1.73_m2} (ref >59)

## 2021-11-26 LAB — LIPID PANEL
Chol/HDL Ratio: 3.2 ratio (ref 0.0–5.0)
Cholesterol, Total: 109 mg/dL (ref 100–199)
HDL: 34 mg/dL — ABNORMAL LOW (ref >39)
LDL Chol Calc (NIH): 57 mg/dL (ref 0–99)
Triglycerides: 95 mg/dL (ref 0–149)
VLDL Cholesterol Cal: 18 mg/dL (ref 5–40)

## 2021-11-26 LAB — MICROALBUMIN / CREATININE URINE RATIO
Creatinine, Urine: 69.3 mg/dL
Microalb/Creat Ratio: <4 mg/g creat (ref 0–29)
Microalbumin, Urine: <3 ug/mL

## 2021-11-26 NOTE — Progress Notes (Signed)
Urgent referral placed for GI evaluation

## 2021-11-26 NOTE — Progress Notes (Unsigned)
Gi

## 2021-12-01 ENCOUNTER — Encounter: Payer: Self-pay | Admitting: Internal Medicine

## 2021-12-08 NOTE — Patient Instructions (Signed)

## 2021-12-09 ENCOUNTER — Ambulatory Visit: Payer: BC Managed Care – PPO | Admitting: Nurse Practitioner

## 2021-12-09 ENCOUNTER — Other Ambulatory Visit: Payer: Self-pay | Admitting: Family Medicine

## 2021-12-09 ENCOUNTER — Encounter: Payer: Self-pay | Admitting: Nurse Practitioner

## 2021-12-09 VITALS — BP 133/81 | HR 89 | Ht 66.0 in | Wt 196.0 lb

## 2021-12-09 DIAGNOSIS — E1165 Type 2 diabetes mellitus with hyperglycemia: Secondary | ICD-10-CM | POA: Diagnosis not present

## 2021-12-09 DIAGNOSIS — E782 Mixed hyperlipidemia: Secondary | ICD-10-CM | POA: Diagnosis not present

## 2021-12-09 DIAGNOSIS — I10 Essential (primary) hypertension: Secondary | ICD-10-CM | POA: Diagnosis not present

## 2021-12-09 MED ORDER — DEXCOM G7 RECEIVER DEVI: 1.0000 | Freq: Once | 0 refills | Status: AC

## 2021-12-09 MED ORDER — LANTUS SOLOSTAR 100 UNIT/ML ~~LOC~~ SOPN: 30.0000 [IU] | PEN_INJECTOR | Freq: Every day | SUBCUTANEOUS | 3 refills | Status: DC

## 2021-12-09 MED ORDER — INSULIN LISPRO (1 UNIT DIAL) 100 UNIT/ML (KWIKPEN): 5.0000 [IU] | PEN_INJECTOR | Freq: Three times a day (TID) | SUBCUTANEOUS | 3 refills | Status: DC

## 2021-12-09 MED ORDER — DEXCOM G7 SENSOR MISC: 1.0000 "application " | 3 refills | Status: DC

## 2021-12-09 MED ORDER — GLUCOSE BLOOD VI STRP: ORAL_STRIP | 12 refills | Status: DC

## 2021-12-09 NOTE — Progress Notes (Signed)
Endocrinology Follow Up Note       12/09/2021, 2:17 PM   Subjective:    Patient ID: Shawn Velazquez, male    DOB: 09-21-1965.  Shawn Velazquez is being seen in follow up after being seen in consultation for management of currently uncontrolled symptomatic diabetes requested by  Shawn Rival, FNP.   Past Medical History:  Diagnosis Date   Anxiety    Arthritis    affects hands, shoulder, neck, knees, hips, ankles, toes   Chronic back pain    Diabetes mellitus    diet controlled   Eczema    Fatty liver, alcoholic    GERD (gastroesophageal reflux disease)    Glaucoma    slight case   Hypertension    Psoriasis    Sleep apnea    Substance abuse ([place])    Vertigo 09/11/2017   Wears glasses     Past Surgical History:  Procedure Laterality Date   MULTIPLE TOOTH EXTRACTIONS     POSTERIOR CERVICAL LAMINECTOMY  04/21/2012   Procedure: POSTERIOR CERVICAL LAMINECTOMY;  Surgeon: Elaina Hoops, MD;  Location: [facility];  Service: Neurosurgery;  Laterality: Left;  Posterior Cervical laminectomy/foraminotomy and diskectomy, left cervical seven-thoracic one   ROTATOR CUFF REPAIR     THORACIC DISCECTOMY Left 04/03/2014   Procedure: Left Thoracic Seven to Eight, Thoracic Eight to Nine Thoracic Discectomy ;  Surgeon: Elaina Hoops, MD;  Location: [facility];  Service: Neurosurgery;  Laterality: Left;    Social History   Socioeconomic History   Marital status: Divorced    Spouse name: Not on file   Number of children: Not on file   Years of education: Not on file   Highest education level: Not on file  Occupational History    Comment: Truck to storage  Tobacco Use   Smoking status: Former    Packs/day: 0.25    Years: 40.00    Pack years: 10.00    Types: E-cigarettes, Cigarettes    Quit date: 05/11/2021    Years since quitting: 0.5   Smokeless tobacco: Never   Tobacco comments:    pt trying to quit on  his own  Vaping Use   Vaping Use: Former  Substance and Sexual Activity   Alcohol use: Yes    Comment: Either Smirnoff Smash-4 per day or Fireball-10 airplane bottles in a day   Drug use: No   Sexual activity: Yes    Birth control/protection: None  Other Topics Concern   Not on file  Social History Narrative   Not on file   Social Determinants of Health   Financial Resource Strain: Not on file  Food Insecurity: Not on file  Transportation Needs: Not on file  Physical Activity: Not on file  Stress: Not on file  Social Connections: Not on file    Family History  Problem Relation Age of Onset   Diabetes Father    Cancer - Lung Father    Hypertension Other    Cancer - Lung Other     Outpatient Encounter Medications as of 12/09/2021  Medication Sig   Continuous Blood Gluc Receiver (DEXCOM G7 RECEIVER) DEVI 1 Device by Does not  apply route once for 1 dose.   Continuous Blood Gluc Sensor (DEXCOM G7 SENSOR) MISC Inject 1 application. into the skin as directed. Change sensor every 10 days as directed.   glucose blood test strip Use as instructed to monitor glucose 4 times daily   insulin lispro (HUMALOG KWIKPEN) 100 UNIT/ML KwikPen Inject 5-11 Units into the skin 3 (three) times daily.   albuterol (VENTOLIN HFA) 108 (90 Base) MCG/ACT inhaler INHALE 2 PUFFS INTO THE LUNGS EVERY 6 HOURS AS NEEDED FOR WHEEZING OR SHORTNESS OF BREATH   amLODipine (NORVASC) 5 MG tablet Take 1 tablet (5 mg total) by mouth daily.   FLUoxetine (PROZAC) 10 MG capsule Take 4 tablets daily making  '40mg'$  daily   hydrochlorothiazide (HYDRODIURIL) 25 MG tablet Take 1 tablet (25 mg total) by mouth daily.   ibuprofen (ADVIL) 800 MG tablet Take 1 tablet (800 mg total) by mouth every 8 (eight) hours as needed for moderate pain. For pain (Patient not taking: Reported on 10/29/2021)   insulin glargine (LANTUS SOLOSTAR) 100 UNIT/ML Solostar Pen Inject 30 Units into the skin at bedtime.   Insulin Pen Needle (PEN NEEDLES)  32G X 4 MM MISC 1 each by Does not apply route in the morning, at noon, in the evening, and at bedtime.   LORazepam (ATIVAN) 1 MG tablet Take 1 tablet (1 mg total) by mouth 2 (two) times daily as needed for anxiety.   losartan (COZAAR) 50 MG tablet Take 1 tablet (50 mg total) by mouth daily.   meclizine (ANTIVERT) 25 MG tablet Take 25 mg by mouth 3 (three) times daily as needed for dizziness.   metFORMIN (GLUCOPHAGE-XR) 500 MG 24 hr tablet Take '1000mg'$  twice daily with meals   montelukast (SINGULAIR) 10 MG tablet Take 1 tablet (10 mg total) by mouth at bedtime.   tiZANidine (ZANAFLEX) 2 MG tablet SMARTSIG:1 Tablet(s) By Mouth 1 to 3 Times Daily PRN   [DISCONTINUED] insulin glargine (LANTUS SOLOSTAR) 100 UNIT/ML Solostar Pen Inject 26 Units into the skin daily.   [DISCONTINUED] insulin lispro (HUMALOG KWIKPEN) 100 UNIT/ML KwikPen Inject 5-11 Units into the skin 3 (three) times daily. (Patient not taking: Reported on 12/09/2021)   No facility-administered encounter medications on file as of 12/09/2021.    ALLERGIES: Allergies  Allergen Reactions   Neosporin + Pain Relief Max St [Neomy-Bacit-Polymyx-Pramoxine] Rash    VACCINATION STATUS: Immunization History  Administered Date(s) Administered   Influenza,inj,Quad PF,6+ Mos 07/31/2021   Moderna Sars-Covid-2 Vaccination 01/09/2020, 02/06/2020   Tdap 08/06/2021   Zoster Recombinat (Shingrix) 07/31/2021, 10/29/2021    Diabetes He presents for his follow-up diabetic visit. He has type 2 diabetes mellitus. Onset time: was diagnosed at approx age of 44. His disease course has been worsening. There are no hypoglycemic associated symptoms. Associated symptoms include blurred vision, fatigue, polydipsia, polyuria and weight loss. There are no hypoglycemic complications. Symptoms are stable. Diabetic complications include nephropathy. Risk factors for coronary artery disease include diabetes mellitus, dyslipidemia, hypertension, male sex, obesity and  sedentary lifestyle. Current diabetic treatment includes oral agent (monotherapy) and insulin injections. He is compliant with treatment some of the time. His weight is decreasing steadily. He is following a generally unhealthy diet. When asked about meal planning, he reported none. He has not had a previous visit with a dietitian. He rarely participates in exercise. His breakfast blood glucose range is generally >200 mg/dl. His bedtime blood glucose range is generally >200 mg/dl. (He presents today, after a long absence, with limited readings showing gross hyperglycemia.  His most recent A1c on 4/27 was 11.3%.  He does have trouble with ETOH addiction.  He has not been on his Humalog in quite some time as PCP didn't feel comfortable filling it.  He recently had dental work done and can only consume liquids or soft foods until he gets dentures.  He denies any hypoglycemia.) An ACE inhibitor/angiotensin II receptor blocker is being taken. He does not see a podiatrist.Eye exam is current.  Hyperlipidemia This is a chronic problem. The current episode started more than 1 year ago. The problem is controlled. Recent lipid tests were reviewed and are normal. Exacerbating diseases include chronic renal disease, diabetes and obesity. Factors aggravating his hyperlipidemia include thiazides and fatty foods. Current antihyperlipidemic treatment includes statins. The current treatment provides mild improvement of lipids. Compliance problems include adherence to diet, adherence to exercise and psychosocial issues.  Risk factors for coronary artery disease include diabetes mellitus, dyslipidemia, family history, hypertension, male sex, obesity and a sedentary lifestyle.  Hypertension This is a chronic problem. The current episode started more than 1 year ago. The problem is unchanged. The problem is uncontrolled. Associated symptoms include blurred vision. There are no associated agents to hypertension. Risk factors for  coronary artery disease include diabetes mellitus, dyslipidemia, family history, male gender, obesity and sedentary lifestyle. Past treatments include diuretics and angiotensin blockers. The current treatment provides mild improvement. Compliance problems include diet, exercise and psychosocial issues.  Hypertensive end-organ damage includes kidney disease. Identifiable causes of hypertension include chronic renal disease.    Review of systems  Constitutional: + steadily decreasing body weight,  current Body mass index is 31.64 kg/m. , + fatigue, no subjective hyperthermia, no subjective hypothermia Eyes: + blurry vision no xerophthalmia ENT: no sore throat, no nodules palpated in throat, no dysphagia/odynophagia, no hoarseness Cardiovascular: no chest pain, no shortness of breath, no palpitations, no leg swelling Respiratory: no cough, no shortness of breath Gastrointestinal: no nausea/vomiting/diarrhea Musculoskeletal: no muscle/joint aches Skin: no rashes, no hyperemia Neurological: no tremors, no numbness, no tingling, no dizziness Psychiatric: no depression, no anxiety  Objective:     BP 133/81   Pulse 89   Ht '5\' 6"'$  (1.676 m)   Wt 196 lb (88.9 kg)   BMI 31.64 kg/m   Wt Readings from Last 3 Encounters:  12/09/21 196 lb (88.9 kg)  10/29/21 198 lb (89.8 kg)  08/28/21 203 lb 1.3 oz (92.1 kg)     BP Readings from Last 3 Encounters:  12/09/21 133/81  10/29/21 129/82  08/28/21 (!) 150/88      Physical Exam- Limited  Constitutional:  Body mass index is 31.64 kg/m. , not in acute distress, normal state of mind Eyes:  EOMI, no exophthalmos Neck: Supple Cardiovascular: RRR, no murmurs, rubs, or gallops, no edema Respiratory: Adequate breathing efforts, no crackles, rales, rhonchi, or wheezing Musculoskeletal: no gross deformities, strength intact in all four extremities, no gross restriction of joint movements Skin:  no rashes, no hyperemia Neurological: no tremor with  outstretched hands    CMP ( most recent) CMP     Component Value Date/Time   NA 135 11/24/2021 0817   K 3.6 11/24/2021 0817   CL 92 (L) 11/24/2021 0817   CO2 26 11/24/2021 0817   GLUCOSE 371 (H) 11/24/2021 0817   GLUCOSE 127 (H) 09/11/2017 2143   BUN 12 11/24/2021 0817   CREATININE 1.18 11/24/2021 0817   CALCIUM 10.1 11/24/2021 0817   PROT 7.5 11/24/2021 0817   ALBUMIN 3.9 11/24/2021 0817  AST 88 (H) 11/24/2021 0817   ALT 64 (H) 11/24/2021 0817   ALKPHOS 329 (H) 11/24/2021 0817   BILITOT 1.6 (H) 11/24/2021 0817   GFRNONAA 72 07/10/2020 1005   GFRAA 84 07/10/2020 1005     Diabetic Labs (most recent): Lab Results  Component Value Date   HGBA1C 11.3 (A) 10/29/2021   HGBA1C 9.8 (H) 07/31/2021   HGBA1C 10.3 (H) 02/05/2021     Lipid Panel ( most recent) Lipid Panel     Component Value Date/Time   CHOL 109 11/24/2021 0817   TRIG 95 11/24/2021 0817   HDL 34 (L) 11/24/2021 0817   CHOLHDL 3.2 11/24/2021 0817   LDLCALC 57 11/24/2021 0817   LABVLDL 18 11/24/2021 0817      No results found for: TSH, FREET4         Assessment & Plan:   1) Uncontrolled type 2 diabetes mellitus with hyperglycemia ([place])  He presents today, after a long absence, with limited readings showing gross hyperglycemia.  His most recent A1c on 4/27 was 11.3%.  He does have trouble with ETOH addiction.  He has not been on his Humalog in quite some time as PCP didn't feel comfortable filling it.  He recently had dental work done and can only consume liquids or soft foods until he gets dentures.  He denies any hypoglycemia.  - Shawn Velazquez has currently uncontrolled symptomatic type 2 DM since 56 years of age.   -Recent labs reviewed.  - I had a long discussion with him about the progressive nature of diabetes and the pathology behind its complications. -his diabetes is complicated by chronic alcohol abuse, mild CKD and he remains at a high risk for more acute and chronic complications  which include CAD, CVA, CKD, retinopathy, and neuropathy. These are all discussed in detail with him.  The following Lifestyle Medicine recommendations according to [facility]) were discussed and offered to patient and he agrees to start the journey:  A. Whole Foods, Plant-based plate comprising of fruits and vegetables, plant-based proteins, whole-grain carbohydrates was discussed in detail with the patient.   A list for source of those nutrients were also provided to the patient.  Patient will use only water or unsweetened tea for hydration. B.  The need to stay away from risky substances including alcohol, smoking; obtaining 7 to 9 hours of restorative sleep, at least 150 minutes of moderate intensity exercise weekly, the importance of healthy social connections,  and stress reduction techniques were discussed. C.  A full color page of  Calorie density of various food groups per pound showing examples of each food groups was provided to the patient.  - Nutritional counseling repeated at each appointment due to patients tendency to fall back in to old habits.  - The patient admits there is a room for improvement in their diet and drink choices. -  Suggestion is made for the patient to avoid simple carbohydrates from their diet including Cakes, Sweet Desserts / Pastries, Ice Cream, Soda (diet and regular), Sweet Tea, Candies, Chips, Cookies, Sweet Pastries, Store Bought Juices, Alcohol in Excess of 1-2 drinks a day, Artificial Sweeteners, Coffee Creamer, and "Sugar-free" Products. This will help patient to have stable blood glucose profile and potentially avoid unintended weight gain.   - I encouraged the patient to switch to unprocessed or minimally processed complex starch and increased protein intake (animal or plant source), fruits, and vegetables.   - Patient is advised to stick to  a routine mealtimes to eat 3 meals a day and avoid unnecessary snacks (to snack  only to correct hypoglycemia).  - I have approached him with the following individualized plan to manage  his diabetes and patient agrees:   -He is advised to increase his Lantus to 30 units SQ nightly and restart Humalog 5-11 units TID with meals if glucose is above 90 and he is eating (Specific instructions on how to titrate insulin dosage based on glucose readings given to patient in writing).  He is also advised to lower his Metformin to 1000 mg ER daily with breakfast (instead of 1000 mg ER BID).   -he is encouraged to restart monitoring blood glucose 4 times daily, before meals and before bed, and to call the clinic if he has readings less than 70 or greater than 300 for 3 tests in a row.   He could greatly benefit from CGM device, I did send in for [place] for him.  - he is warned not to take insulin without proper monitoring per orders. - Adjustment parameters are given to him for hypo and hyperglycemia in writing.  - he will be considered for incretin therapy as appropriate next visit.  - Specific targets for  A1c;  LDL, HDL,  and Triglycerides were discussed with the patient.  2) Blood Pressure /Hypertension:  his blood pressure is controlled to target.   he is advised to continue his current medications including Norvasc 5 mg po daily, HCTZ 25 mg po daily and Losartan 50 mg po daily.  3) Lipids/Hyperlipidemia:    Review of his recent lipid panel from 11/24/21 showed controlled  LDL at 57.  He is not currently on lipid lowering medications likely due to elevated LFTs.  4)  Weight/Diet:  his Body mass index is 31.64 kg/m.  -  clearly complicating his diabetes care.   he is a candidate for weight loss. I discussed with him the fact that loss of 5 - 10% of his  current body weight will have the most impact on his diabetes management.  Exercise, and detailed carbohydrates information provided  -  detailed on discharge instructions.  5) Chronic Care/Health Maintenance: -he is on  ACEI/ARB and Statin medications and is encouraged to initiate and continue to follow up with Ophthalmology, Dentist, Podiatrist at least yearly or according to recommendations, and advised to stay away from smoking. I have recommended yearly flu vaccine and pneumonia vaccine at least every 5 years; moderate intensity exercise for up to 150 minutes weekly; and sleep for at least 7 hours a day.  - he is advised to maintain close follow up with Shawn Rival, FNP for primary care needs, as well as his other providers for optimal and coordinated care.     I spent 45 minutes in the care of the patient today including review of labs from [facility], Lipids, Thyroid Function, Hematology (current and previous including abstractions from other facilities); face-to-face time discussing  his blood glucose readings/logs, discussing hypoglycemia and hyperglycemia episodes and symptoms, medications doses, his options of short and long term treatment based on the latest standards of care / guidelines;  discussion about incorporating lifestyle medicine;  and documenting the encounter.    Please refer to Patient Instructions for Blood Glucose Monitoring and Insulin/Medications Dosing Guide"  in media tab for additional information. Please  also refer to " Patient Self Inventory" in the Media  tab for reviewed elements of pertinent patient history.  Shirlean Mylar participated in  the discussions, expressed understanding, and voiced agreement with the above plans.  All questions were answered to his satisfaction. he is encouraged to contact clinic should he have any questions or concerns prior to his return visit.   Follow up plan: - Return in about 1 month (around 01/08/2022) for Diabetes F/U, Bring meter and logs.  Rayetta Pigg, [facility] [address] [place], [place] [address] Phone: [phone] Fax: [phone]  12/09/2021, 2:17 PM

## 2021-12-10 ENCOUNTER — Other Ambulatory Visit: Payer: Self-pay | Admitting: Nurse Practitioner

## 2021-12-18 LAB — GAMMA GT: GGT: 1014 IU/L (ref 0–65)

## 2021-12-18 LAB — SPECIMEN STATUS REPORT

## 2021-12-24 NOTE — Progress Notes (Deleted)
GI Office Note    Referring Provider: Renee Rival, FNP Primary Care Physician:  Renee Rival, FNP  Primary GI: Dr. Abbey Chatters  Chief Complaint   No chief complaint on file.    History of Present Illness   Shawn Velazquez is a 56 y.o. male presenting today at the request of Paseda, Folashade R, FNP for elevated liver enzymes.   LFTs appear to be elevated dating back to 2015. HCV negative, HIV negative January 2022.   Most recent labs 11/24/21 - Glucose 371, AST 88, ALT 64, alk phos 329, T. bili 1.6 (normal from August 2022 and prior).  Per review of labs, alk phos has been steadily rising, was 190 in January 2022, previously normal in 2015.  GGT 1014  Hemoglobin A1c 11.3 on 10/29/2021. PCP recently referred patient to endocrinology.   CBC 07/31/21 with low platelets 146, previously 141-149 dating back to January 2022.  No abdominal imaging on file since 02/21/2014 - mildly diffuse fatty liver. No ascites.   Today: Alcohol use Drug use? Recent tattoos? Recent travel Right-sided heart failure (congestive hepatopathy) Diabetes mellitus Obesity (nonalcoholic fatty liver disease) Inflammatory bowel disease? Early onset emphysema? Celiac disease? Thyroid disease?   Past Medical History:  Diagnosis Date   Anxiety    Arthritis    affects hands, shoulder, neck, knees, hips, ankles, toes   Chronic back pain    Diabetes mellitus    diet controlled   Eczema    Fatty liver, alcoholic    GERD (gastroesophageal reflux disease)    Glaucoma    slight case   Hypertension    Psoriasis    Sleep apnea    Substance abuse ([place])    Vertigo 09/11/2017   Wears glasses     Past Surgical History:  Procedure Laterality Date   MULTIPLE TOOTH EXTRACTIONS     POSTERIOR CERVICAL LAMINECTOMY  04/21/2012   Procedure: POSTERIOR CERVICAL LAMINECTOMY;  Surgeon: Elaina Hoops, MD;  Location: [place] [facility];  Service: Neurosurgery;  Laterality: Left;  Posterior Cervical  laminectomy/foraminotomy and diskectomy, left cervical seven-thoracic one   ROTATOR CUFF REPAIR     THORACIC DISCECTOMY Left 04/03/2014   Procedure: Left Thoracic Seven to Eight, Thoracic Eight to Nine Thoracic Discectomy ;  Surgeon: Elaina Hoops, MD;  Location: [place] [facility];  Service: Neurosurgery;  Laterality: Left;    Current Outpatient Medications  Medication Sig Dispense Refill   albuterol (VENTOLIN HFA) 108 (90 Base) MCG/ACT inhaler INHALE 2 PUFFS INTO THE LUNGS EVERY 6 HOURS AS NEEDED FOR WHEEZING OR SHORTNESS OF BREATH 20.1 g 0   amLODipine (NORVASC) 5 MG tablet Take 1 tablet (5 mg total) by mouth daily. 90 tablet 1   Continuous Blood Gluc Sensor (DEXCOM G7 SENSOR) MISC Inject 1 application. into the skin as directed. Change sensor every 10 days as directed. 6 each 3   FLUoxetine (PROZAC) 10 MG capsule TAKE 4 CAPSULES BY MOUTH EVERY DAY 260 capsule 0   glucose blood test strip Use as instructed to monitor glucose 4 times daily 100 each 12   hydrochlorothiazide (HYDRODIURIL) 25 MG tablet Take 1 tablet (25 mg total) by mouth daily. 90 tablet 1   ibuprofen (ADVIL) 800 MG tablet Take 1 tablet (800 mg total) by mouth every 8 (eight) hours as needed for moderate pain. For pain (Patient not taking: Reported on 10/29/2021) 270 tablet 0   insulin glargine (LANTUS SOLOSTAR) 100 UNIT/ML Solostar Pen Inject 30 Units into the skin at bedtime.  30 mL 3   insulin lispro (HUMALOG KWIKPEN) 100 UNIT/ML KwikPen Inject 5-11 Units into the skin 3 (three) times daily. 30 mL 3   Insulin Pen Needle (PEN NEEDLES) 32G X 4 MM MISC 1 each by Does not apply route in the morning, at noon, in the evening, and at bedtime. 100 each 11   LORazepam (ATIVAN) 1 MG tablet Take 1 tablet (1 mg total) by mouth 2 (two) times daily as needed for anxiety. 60 tablet 0   losartan (COZAAR) 50 MG tablet Take 1 tablet (50 mg total) by mouth daily. 90 tablet 1   meclizine (ANTIVERT) 25 MG tablet Take 25 mg by mouth 3 (three) times daily as  needed for dizziness.     metFORMIN (GLUCOPHAGE-XR) 500 MG 24 hr tablet Take 1043m twice daily with meals 120 tablet 3   montelukast (SINGULAIR) 10 MG tablet TAKE 1 TABLET(10 MG) BY MOUTH AT BEDTIME 30 tablet 3   tiZANidine (ZANAFLEX) 2 MG tablet SMARTSIG:1 Tablet(s) By Mouth 1 to 3 Times Daily PRN     No current facility-administered medications for this visit.    Allergies as of 12/28/2021 - Review Complete 12/09/2021  Allergen Reaction Noted   Neosporin + pain relief max st [neomy-bacit-polymyx-pramoxine] Rash 04/19/2012    Family History  Problem Relation Age of Onset   Diabetes Father    Cancer - Lung Father    Hypertension Other    Cancer - Lung Other     Social History   Socioeconomic History   Marital status: Divorced    Spouse name: Not on file   Number of children: Not on file   Years of education: Not on file   Highest education level: Not on file  Occupational History    Comment: Truck to storage  Tobacco Use   Smoking status: Former    Packs/day: 0.25    Years: 40.00    Total pack years: 10.00    Types: E-cigarettes, Cigarettes    Quit date: 05/11/2021    Years since quitting: 0.6   Smokeless tobacco: Never   Tobacco comments:    pt trying to quit on his own  Vaping Use   Vaping Use: Former  Substance and Sexual Activity   Alcohol use: Yes    Comment: Either Smirnoff Smash-4 per day or Fireball-10 airplane bottles in a day   Drug use: No   Sexual activity: Yes    Birth control/protection: None  Other Topics Concern   Not on file  Social History Narrative   Not on file   Social Determinants of Health   Financial Resource Strain: Not on file  Food Insecurity: Not on file  Transportation Needs: Not on file  Physical Activity: Not on file  Stress: Not on file  Social Connections: Not on file  Intimate Partner Violence: Not on file     Review of Systems   Gen: Denies any fever, chills, fatigue, weight loss, lack of appetite.  CV: Denies  chest pain, heart palpitations, peripheral edema, syncope.  Resp: Denies shortness of breath at rest or with exertion. Denies wheezing or cough.  GI: see HPI GU : Denies urinary burning, urinary frequency, urinary hesitancy MS: Denies joint pain, muscle weakness, cramps, or limitation of movement.  Derm: Denies rash, itching, dry skin Psych: Denies depression, anxiety, memory loss, and confusion Heme: Denies bruising, bleeding, and enlarged lymph nodes.   Physical Exam   There were no vitals taken for this visit.  General:   Alert  and oriented. Pleasant and cooperative. Well-nourished and well-developed.  Head:  Normocephalic and atraumatic. Eyes:  Without icterus, sclera clear and conjunctiva pink.  Ears:  Normal auditory acuity. Mouth:  No deformity or lesions, oral mucosa pink.  Lungs:  Clear to auscultation bilaterally. No wheezes, rales, or rhonchi. No distress.  Heart:  S1, S2 present without murmurs appreciated.  Abdomen:  +BS, soft, non-tender and non-distended. No HSM noted. No guarding or rebound. No masses appreciated.  Rectal:  Deferred  Msk:  Symmetrical without gross deformities. Normal posture. Extremities:  Without edema. Neurologic:  Alert and  oriented x4;  grossly normal neurologically. Skin:  Intact without significant lesions or rashes. Psych:  Alert and cooperative. Normal mood and affect.   Assessment   Shawn Velazquez is a 56 y.o. male with a history of uncontrolled diabetes, anxiety, fatty liver, GERD, and HTN*** presenting today for evaluation of elevated LFTs.   Elevated LFTs: Cholestatic pattern. Elevated Alk Phos to 329, GGT significantly elevated at 1014, AST and ALT mildly elevated at 88/64 respectively. Patient does have history of fatty liver noted on abdominal imaging from 2015. Diabetes also not under control with elevated glucose on labs and most recent HgbA1c 11.1.   PLAN   *** RUQ U/S ASAP CBC, Iron panel, TSH, Hep A Ab, Hep B surface  Ag, Hep B core Ab, Hep B surface Ab, repeat HCV Ab Repeat HFP in 1 month Work on dietary changes, carb consistent diet.  Fatty liver handout provided   Venetia Night, MSN, FNP-BC, AGACNP-BC [facility]

## 2021-12-28 ENCOUNTER — Ambulatory Visit: Payer: BC Managed Care – PPO | Admitting: Gastroenterology

## 2022-01-06 ENCOUNTER — Ambulatory Visit: Payer: BC Managed Care – PPO | Admitting: Nurse Practitioner

## 2022-01-14 ENCOUNTER — Telehealth: Payer: Self-pay

## 2022-01-14 ENCOUNTER — Other Ambulatory Visit: Payer: Self-pay

## 2022-01-14 ENCOUNTER — Other Ambulatory Visit: Payer: Self-pay | Admitting: Nurse Practitioner

## 2022-01-14 ENCOUNTER — Encounter: Payer: Self-pay | Admitting: *Deleted

## 2022-01-14 DIAGNOSIS — R748 Abnormal levels of other serum enzymes: Secondary | ICD-10-CM

## 2022-01-14 NOTE — Telephone Encounter (Signed)
Called pt no answer left vm to call back. Per fola pt needs to come in and have labs done next week. She also wants him to contact [facility] to r/s missed appt.

## 2022-02-04 ENCOUNTER — Encounter: Payer: Self-pay | Admitting: Nurse Practitioner

## 2022-02-04 ENCOUNTER — Ambulatory Visit: Payer: BC Managed Care – PPO | Admitting: Nurse Practitioner

## 2022-02-04 VITALS — BP 135/82 | HR 81 | Ht 66.0 in | Wt 189.0 lb

## 2022-02-04 DIAGNOSIS — E1165 Type 2 diabetes mellitus with hyperglycemia: Secondary | ICD-10-CM | POA: Diagnosis not present

## 2022-02-04 DIAGNOSIS — E782 Mixed hyperlipidemia: Secondary | ICD-10-CM

## 2022-02-04 DIAGNOSIS — I10 Essential (primary) hypertension: Secondary | ICD-10-CM

## 2022-02-04 LAB — POCT GLYCOSYLATED HEMOGLOBIN (HGB A1C): HbA1c POC (<> result, manual entry): 10.1 % (ref 4.0–5.6)

## 2022-02-04 MED ORDER — LANTUS SOLOSTAR 100 UNIT/ML ~~LOC~~ SOPN: 40.0000 [IU] | PEN_INJECTOR | Freq: Every day | SUBCUTANEOUS | 3 refills | Status: DC

## 2022-02-04 MED ORDER — INSULIN LISPRO (1 UNIT DIAL) 100 UNIT/ML (KWIKPEN): 8.0000 [IU] | PEN_INJECTOR | Freq: Three times a day (TID) | SUBCUTANEOUS | 3 refills | Status: DC

## 2022-02-04 NOTE — Progress Notes (Signed)
Endocrinology Follow Up Note       02/04/2022, 4:41 PM   Subjective:    Patient ID: Shawn Velazquez, male    DOB: 1965/11/20.  Shawn Velazquez is being seen in follow up after being seen in consultation for management of currently uncontrolled symptomatic diabetes requested by  Renee Rival, FNP.   Past Medical History:  Diagnosis Date   Anxiety    Arthritis    affects hands, shoulder, neck, knees, hips, ankles, toes   Chronic back pain    Diabetes mellitus    diet controlled   Eczema    Fatty liver, alcoholic    GERD (gastroesophageal reflux disease)    Glaucoma    slight case   Hypertension    Psoriasis    Sleep apnea    Substance abuse ([place])    Vertigo 09/11/2017   Wears glasses     Past Surgical History:  Procedure Laterality Date   MULTIPLE TOOTH EXTRACTIONS     POSTERIOR CERVICAL LAMINECTOMY  04/21/2012   Procedure: POSTERIOR CERVICAL LAMINECTOMY;  Surgeon: Elaina Hoops, MD;  Location: [facility];  Service: Neurosurgery;  Laterality: Left;  Posterior Cervical laminectomy/foraminotomy and diskectomy, left cervical seven-thoracic one   ROTATOR CUFF REPAIR     THORACIC DISCECTOMY Left 04/03/2014   Procedure: Left Thoracic Seven to Eight, Thoracic Eight to Nine Thoracic Discectomy ;  Surgeon: Elaina Hoops, MD;  Location: [facility];  Service: Neurosurgery;  Laterality: Left;    Social History   Socioeconomic History   Marital status: Divorced    Spouse name: Not on file   Number of children: Not on file   Years of education: Not on file   Highest education level: Not on file  Occupational History    Comment: Truck to storage  Tobacco Use   Smoking status: Former    Packs/day: 0.25    Years: 40.00    Total pack years: 10.00    Types: E-cigarettes, Cigarettes    Quit date: 05/11/2021    Years since quitting: 0.7   Smokeless tobacco: Never   Tobacco comments:    pt trying to  quit on his own  Vaping Use   Vaping Use: Former  Substance and Sexual Activity   Alcohol use: Yes    Comment: Either Smirnoff Smash-4 per day or Fireball-10 airplane bottles in a day   Drug use: No   Sexual activity: Yes    Birth control/protection: None  Other Topics Concern   Not on file  Social History Narrative   Not on file   Social Determinants of Health   Financial Resource Strain: Not on file  Food Insecurity: Not on file  Transportation Needs: Not on file  Physical Activity: Not on file  Stress: Not on file  Social Connections: Not on file    Family History  Problem Relation Age of Onset   Diabetes Father    Cancer - Lung Father    Hypertension Other    Cancer - Lung Other     Outpatient Encounter Medications as of 02/04/2022  Medication Sig   albuterol (VENTOLIN HFA) 108 (90 Base) MCG/ACT inhaler INHALE 2 PUFFS INTO  THE LUNGS EVERY 6 HOURS AS NEEDED FOR WHEEZING OR SHORTNESS OF BREATH   amLODipine (NORVASC) 5 MG tablet Take 1 tablet (5 mg total) by mouth daily.   Continuous Blood Gluc Sensor (DEXCOM G7 SENSOR) MISC Inject 1 application. into the skin as directed. Change sensor every 10 days as directed.   FLUoxetine (PROZAC) 10 MG capsule TAKE 4 CAPSULES BY MOUTH EVERY DAY   glucose blood test strip Use as instructed to monitor glucose 4 times daily   hydrochlorothiazide (HYDRODIURIL) 25 MG tablet Take 1 tablet (25 mg total) by mouth daily.   ibuprofen (ADVIL) 800 MG tablet Take 1 tablet (800 mg total) by mouth every 8 (eight) hours as needed for moderate pain. For pain (Patient not taking: Reported on 10/29/2021)   insulin glargine (LANTUS SOLOSTAR) 100 UNIT/ML Solostar Pen Inject 40 Units into the skin at bedtime.   insulin lispro (HUMALOG KWIKPEN) 100 UNIT/ML KwikPen Inject 8-14 Units into the skin 3 (three) times daily.   Insulin Pen Needle (PEN NEEDLES) 32G X 4 MM MISC 1 each by Does not apply route in the morning, at noon, in the evening, and at bedtime.    LORazepam (ATIVAN) 1 MG tablet Take 1 tablet (1 mg total) by mouth 2 (two) times daily as needed for anxiety.   losartan (COZAAR) 50 MG tablet Take 1 tablet (50 mg total) by mouth daily.   meclizine (ANTIVERT) 25 MG tablet Take 25 mg by mouth 3 (three) times daily as needed for dizziness.   metFORMIN (GLUCOPHAGE-XR) 500 MG 24 hr tablet Take '1000mg'$  twice daily with meals   montelukast (SINGULAIR) 10 MG tablet TAKE 1 TABLET(10 MG) BY MOUTH AT BEDTIME   tiZANidine (ZANAFLEX) 2 MG tablet SMARTSIG:1 Tablet(s) By Mouth 1 to 3 Times Daily PRN   [DISCONTINUED] insulin glargine (LANTUS SOLOSTAR) 100 UNIT/ML Solostar Pen Inject 30 Units into the skin at bedtime.   [DISCONTINUED] insulin lispro (HUMALOG KWIKPEN) 100 UNIT/ML KwikPen Inject 5-11 Units into the skin 3 (three) times daily.   No facility-administered encounter medications on file as of 02/04/2022.    ALLERGIES: Allergies  Allergen Reactions   Neosporin + Pain Relief Max St [Neomy-Bacit-Polymyx-Pramoxine] Rash    VACCINATION STATUS: Immunization History  Administered Date(s) Administered   Influenza,inj,Quad PF,6+ Mos 07/31/2021   Moderna Sars-Covid-2 Vaccination 01/09/2020, 02/06/2020   Tdap 08/06/2021   Zoster Recombinat (Shingrix) 07/31/2021, 10/29/2021    Diabetes He presents for his follow-up diabetic visit. He has type 2 diabetes mellitus. Onset time: was diagnosed at approx age of 56. His disease course has been fluctuating. There are no hypoglycemic associated symptoms. Associated symptoms include blurred vision, fatigue, polydipsia, polyuria and weight loss. There are no hypoglycemic complications. Symptoms are stable. Diabetic complications include nephropathy. Risk factors for coronary artery disease include diabetes mellitus, dyslipidemia, hypertension, male sex, obesity and sedentary lifestyle. Current diabetic treatment includes oral agent (monotherapy) and intensive insulin program. He is compliant with treatment most of the  time. His weight is decreasing steadily. He is following a generally unhealthy diet. When asked about meal planning, he reported none. He has not had a previous visit with a dietitian. He rarely participates in exercise. His home blood glucose trend is fluctuating dramatically. (He presents today with his meter and logs showing drastically fluctuating glycemic profile overall.  His POCT A1c today is 10.1%, improving from last visit of 11.3%.  He brought his Dexcom with him today for assistance in applying.  He notes he has not quite drinking ETOH yet, but  has cut back.  ) An ACE inhibitor/angiotensin II receptor blocker is being taken. He does not see a podiatrist.Eye exam is current.  Hyperlipidemia This is a chronic problem. The current episode started more than 1 year ago. The problem is controlled. Recent lipid tests were reviewed and are normal. Exacerbating diseases include chronic renal disease, diabetes and obesity. Factors aggravating his hyperlipidemia include thiazides and fatty foods. Current antihyperlipidemic treatment includes statins. The current treatment provides mild improvement of lipids. Compliance problems include adherence to diet, adherence to exercise and psychosocial issues.  Risk factors for coronary artery disease include diabetes mellitus, dyslipidemia, family history, hypertension, male sex, obesity and a sedentary lifestyle.  Hypertension This is a chronic problem. The current episode started more than 1 year ago. The problem is unchanged. The problem is uncontrolled. Associated symptoms include blurred vision. There are no associated agents to hypertension. Risk factors for coronary artery disease include diabetes mellitus, dyslipidemia, family history, male gender, obesity and sedentary lifestyle. Past treatments include diuretics and angiotensin blockers. The current treatment provides mild improvement. Compliance problems include diet, exercise and psychosocial issues.   Hypertensive end-organ damage includes kidney disease. Identifiable causes of hypertension include chronic renal disease.     Review of systems  Constitutional: + steadily decreasing body weight,  current Body mass index is 30.51 kg/m. , + fatigue, no subjective hyperthermia, no subjective hypothermia Eyes: + blurry vision no xerophthalmia ENT: no sore throat, no nodules palpated in throat, no dysphagia/odynophagia, no hoarseness Cardiovascular: no chest pain, no shortness of breath, no palpitations, no leg swelling Respiratory: no cough, no shortness of breath Gastrointestinal: no nausea/vomiting/diarrhea Musculoskeletal: no muscle/joint aches Skin: no rashes, no hyperemia Neurological: no tremors, no numbness, no tingling, no dizziness Psychiatric: no depression, no anxiety  Objective:     BP 135/82   Pulse 81   Ht '5\' 6"'$  (1.676 m)   Wt 189 lb (85.7 kg)   BMI 30.51 kg/m   Wt Readings from Last 3 Encounters:  02/04/22 189 lb (85.7 kg)  12/09/21 196 lb (88.9 kg)  10/29/21 198 lb (89.8 kg)     BP Readings from Last 3 Encounters:  02/04/22 135/82  12/09/21 133/81  10/29/21 129/82      Physical Exam- Limited  Constitutional:  Body mass index is 30.51 kg/m. , not in acute distress, normal state of mind Eyes:  EOMI, no exophthalmos Neck: Supple Cardiovascular: RRR, no murmurs, rubs, or gallops, no edema Respiratory: Adequate breathing efforts, no crackles, rales, rhonchi, or wheezing Musculoskeletal: no gross deformities, strength intact in all four extremities, no gross restriction of joint movements Skin:  no rashes, no hyperemia Neurological: no tremor with outstretched hands    CMP ( most recent) CMP     Component Value Date/Time   NA 135 11/24/2021 0817   K 3.6 11/24/2021 0817   CL 92 (L) 11/24/2021 0817   CO2 26 11/24/2021 0817   GLUCOSE 371 (H) 11/24/2021 0817   GLUCOSE 127 (H) 09/11/2017 2143   BUN 12 11/24/2021 0817   CREATININE 1.18 11/24/2021 0817    CALCIUM 10.1 11/24/2021 0817   PROT 7.5 11/24/2021 0817   ALBUMIN 3.9 11/24/2021 0817   AST 88 (H) 11/24/2021 0817   ALT 64 (H) 11/24/2021 0817   ALKPHOS 329 (H) 11/24/2021 0817   BILITOT 1.6 (H) 11/24/2021 0817   GFRNONAA 72 07/10/2020 1005   GFRAA 84 07/10/2020 1005     Diabetic Labs (most recent): Lab Results  Component Value Date   HGBA1C  10.1 02/04/2022   HGBA1C 11.3 (A) 10/29/2021   HGBA1C 9.8 (H) 07/31/2021     Lipid Panel ( most recent) Lipid Panel     Component Value Date/Time   CHOL 109 11/24/2021 0817   TRIG 95 11/24/2021 0817   HDL 34 (L) 11/24/2021 0817   CHOLHDL 3.2 11/24/2021 0817   LDLCALC 57 11/24/2021 0817   LABVLDL 18 11/24/2021 0817      No results found for: "TSH", "FREET4"         Assessment & Plan:   1) Uncontrolled type 2 diabetes mellitus with hyperglycemia ([place])  He presents today with his meter and logs showing drastically fluctuating glycemic profile overall.  His POCT A1c today is 10.1%, improving from last visit of 11.3%.  He brought his Dexcom with him today for assistance in applying.  He notes he has not quite drinking ETOH yet, but has cut back.    - KELII CHITTUM has currently uncontrolled symptomatic type 2 DM since 56 years of age.   -Recent labs reviewed.  - I had a long discussion with him about the progressive nature of diabetes and the pathology behind its complications. -his diabetes is complicated by chronic alcohol abuse, mild CKD and he remains at a high risk for more acute and chronic complications which include CAD, CVA, CKD, retinopathy, and neuropathy. These are all discussed in detail with him.  The following Lifestyle Medicine recommendations according to [employer]) were discussed and offered to patient and he agrees to start the journey:  A. Whole Foods, Plant-based plate comprising of fruits and vegetables, plant-based proteins, whole-grain carbohydrates was discussed in  detail with the patient.   A list for source of those nutrients were also provided to the patient.  Patient will use only water or unsweetened tea for hydration. B.  The need to stay away from risky substances including alcohol, smoking; obtaining 7 to 9 hours of restorative sleep, at least 150 minutes of moderate intensity exercise weekly, the importance of healthy social connections,  and stress reduction techniques were discussed. C.  A full color page of  Calorie density of various food groups per pound showing examples of each food groups was provided to the patient.  - Nutritional counseling repeated at each appointment due to patients tendency to fall back in to old habits.  - The patient admits there is a room for improvement in their diet and drink choices. -  Suggestion is made for the patient to avoid simple carbohydrates from their diet including Cakes, Sweet Desserts / Pastries, Ice Cream, Soda (diet and regular), Sweet Tea, Candies, Chips, Cookies, Sweet Pastries, Store Bought Juices, Alcohol in Excess of 1-2 drinks a day, Artificial Sweeteners, Coffee Creamer, and "Sugar-free" Products. This will help patient to have stable blood glucose profile and potentially avoid unintended weight gain.   - I encouraged the patient to switch to unprocessed or minimally processed complex starch and increased protein intake (animal or plant source), fruits, and vegetables.   - Patient is advised to stick to a routine mealtimes to eat 3 meals a day and avoid unnecessary snacks (to snack only to correct hypoglycemia).  - I have approached him with the following individualized plan to manage  his diabetes and patient agrees:   -He is advised to increase his Lantus to 40 units SQ nightly and increase his Humalog to 8-14 units TID with meals if glucose is above 90 and he is eating (Specific instructions on  how to titrate insulin dosage based on glucose readings given to patient in writing).  He is also  advised to continue his Metformin 500 mg ER twice daily.   -he is encouraged to restart monitoring blood glucose 4 times daily, before meals and before bed, and to call the clinic if he has readings less than 70 or greater than 300 for 3 tests in a row.     - he is warned not to take insulin without proper monitoring per orders. - Adjustment parameters are given to him for hypo and hyperglycemia in writing.  - Specific targets for  A1c;  LDL, HDL,  and Triglycerides were discussed with the patient.  2) Blood Pressure /Hypertension:  his blood pressure is controlled to target.   he is advised to continue his current medications including Norvasc 5 mg po daily, HCTZ 25 mg po daily and Losartan 50 mg po daily.  3) Lipids/Hyperlipidemia:    Review of his recent lipid panel from 11/24/21 showed controlled  LDL at 57.  He is not currently on lipid lowering medications likely due to elevated LFTs.  4)  Weight/Diet:  his Body mass index is 30.51 kg/m.  -  clearly complicating his diabetes care.   he is a candidate for weight loss. I discussed with him the fact that loss of 5 - 10% of his  current body weight will have the most impact on his diabetes management.  Exercise, and detailed carbohydrates information provided  -  detailed on discharge instructions.  5) Chronic Care/Health Maintenance: -he is on ACEI/ARB and Statin medications and is encouraged to initiate and continue to follow up with Ophthalmology, Dentist, Podiatrist at least yearly or according to recommendations, and advised to stay away from smoking. I have recommended yearly flu vaccine and pneumonia vaccine at least every 5 years; moderate intensity exercise for up to 150 minutes weekly; and sleep for at least 7 hours a day.  - he is advised to maintain close follow up with Renee Rival, FNP for primary care needs, as well as his other providers for optimal and coordinated care.     I spent 44 minutes in the care of the  patient today including review of labs from [place], Lipids, Thyroid Function, Hematology (current and previous including abstractions from other facilities); face-to-face time discussing  his blood glucose readings/logs, discussing hypoglycemia and hyperglycemia episodes and symptoms, medications doses, his options of short and long term treatment based on the latest standards of care / guidelines;  discussion about incorporating lifestyle medicine;  and documenting the encounter. Risk reduction counseling performed per USPSTF guidelines to reduce obesity and cardiovascular risk factors.     Please refer to Patient Instructions for Blood Glucose Monitoring and Insulin/Medications Dosing Guide"  in media tab for additional information. Please  also refer to " Patient Self Inventory" in the Media  tab for reviewed elements of pertinent patient history.  Shirlean Mylar participated in the discussions, expressed understanding, and voiced agreement with the above plans.  All questions were answered to his satisfaction. he is encouraged to contact clinic should he have any questions or concerns prior to his return visit.   Follow up plan: - Return in about 3 months (around 05/07/2022) for Diabetes F/U with A1c in office, No previsit labs, Bring meter and logs.  Rayetta Pigg, [facility] [address] [place], [place] [address] Phone: [phone] Fax: [phone]  02/04/2022, 4:41 PM

## 2022-02-10 ENCOUNTER — Ambulatory Visit: Payer: BC Managed Care – PPO | Admitting: Internal Medicine

## 2022-02-16 ENCOUNTER — Other Ambulatory Visit: Payer: Self-pay | Admitting: Nurse Practitioner

## 2022-02-18 DIAGNOSIS — K700 Alcoholic fatty liver: Secondary | ICD-10-CM | POA: Diagnosis not present

## 2022-02-18 DIAGNOSIS — M1990 Unspecified osteoarthritis, unspecified site: Secondary | ICD-10-CM | POA: Insufficient documentation

## 2022-02-18 DIAGNOSIS — G8929 Other chronic pain: Secondary | ICD-10-CM | POA: Insufficient documentation

## 2022-02-18 DIAGNOSIS — F1910 Other psychoactive substance abuse, uncomplicated: Secondary | ICD-10-CM | POA: Insufficient documentation

## 2022-02-18 DIAGNOSIS — F419 Anxiety disorder, unspecified: Secondary | ICD-10-CM | POA: Diagnosis not present

## 2022-02-18 DIAGNOSIS — K219 Gastro-esophageal reflux disease without esophagitis: Secondary | ICD-10-CM | POA: Insufficient documentation

## 2022-02-18 DIAGNOSIS — E119 Type 2 diabetes mellitus without complications: Secondary | ICD-10-CM

## 2022-02-18 DIAGNOSIS — L309 Dermatitis, unspecified: Secondary | ICD-10-CM | POA: Insufficient documentation

## 2022-02-18 DIAGNOSIS — H409 Unspecified glaucoma: Secondary | ICD-10-CM | POA: Insufficient documentation

## 2022-02-18 DIAGNOSIS — L409 Psoriasis, unspecified: Secondary | ICD-10-CM | POA: Insufficient documentation

## 2022-03-03 DIAGNOSIS — F419 Anxiety disorder, unspecified: Secondary | ICD-10-CM | POA: Diagnosis not present

## 2022-03-03 DIAGNOSIS — Z125 Encounter for screening for malignant neoplasm of prostate: Secondary | ICD-10-CM | POA: Diagnosis not present

## 2022-03-03 DIAGNOSIS — E1165 Type 2 diabetes mellitus with hyperglycemia: Secondary | ICD-10-CM | POA: Diagnosis not present

## 2022-03-03 DIAGNOSIS — I10 Essential (primary) hypertension: Secondary | ICD-10-CM | POA: Diagnosis not present

## 2022-03-04 ENCOUNTER — Ambulatory Visit (INDEPENDENT_AMBULATORY_CARE_PROVIDER_SITE_OTHER): Payer: BC Managed Care – PPO | Admitting: Internal Medicine

## 2022-03-04 ENCOUNTER — Encounter: Payer: Self-pay | Admitting: *Deleted

## 2022-03-04 ENCOUNTER — Other Ambulatory Visit: Payer: Self-pay | Admitting: *Deleted

## 2022-03-04 ENCOUNTER — Encounter: Payer: Self-pay | Admitting: Internal Medicine

## 2022-03-04 ENCOUNTER — Encounter: Payer: BC Managed Care – PPO | Admitting: Nurse Practitioner

## 2022-03-04 VITALS — BP 115/69 | HR 90 | Temp 97.5°F | Ht 66.0 in | Wt 188.2 lb

## 2022-03-04 DIAGNOSIS — K219 Gastro-esophageal reflux disease without esophagitis: Secondary | ICD-10-CM | POA: Diagnosis not present

## 2022-03-04 DIAGNOSIS — Z1211 Encounter for screening for malignant neoplasm of colon: Secondary | ICD-10-CM

## 2022-03-04 DIAGNOSIS — Z789 Other specified health status: Secondary | ICD-10-CM

## 2022-03-04 DIAGNOSIS — K760 Fatty (change of) liver, not elsewhere classified: Secondary | ICD-10-CM

## 2022-03-04 DIAGNOSIS — M1990 Unspecified osteoarthritis, unspecified site: Secondary | ICD-10-CM | POA: Diagnosis not present

## 2022-03-04 DIAGNOSIS — R7989 Other specified abnormal findings of blood chemistry: Secondary | ICD-10-CM | POA: Diagnosis not present

## 2022-03-04 DIAGNOSIS — G8929 Other chronic pain: Secondary | ICD-10-CM | POA: Diagnosis not present

## 2022-03-04 DIAGNOSIS — F1090 Alcohol use, unspecified, uncomplicated: Secondary | ICD-10-CM

## 2022-03-04 DIAGNOSIS — F419 Anxiety disorder, unspecified: Secondary | ICD-10-CM | POA: Diagnosis not present

## 2022-03-04 DIAGNOSIS — K700 Alcoholic fatty liver: Secondary | ICD-10-CM | POA: Diagnosis not present

## 2022-03-04 IMAGING — CT CT HEAD W/O CM
3 series · 14 of 47 positions shown, 16 images · non-contrast
Comparison: None.

CLINICAL DATA: Pt drank too much at dinner and fell in parking lot
at restaurant. Pt has large hematoma to the back of the head.
Initial encounter



[Series 2: head w o · axial · 0.45mm/px · z∈[+235,+365]mm · 8 of 32 slices shown, 10 images]
[im 3/32  brain]
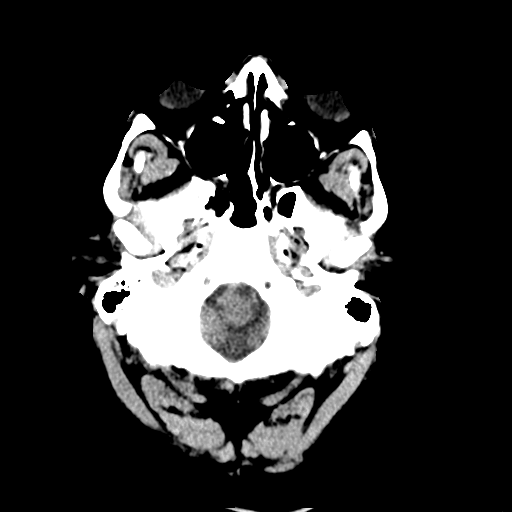
[im 3/32  bone]
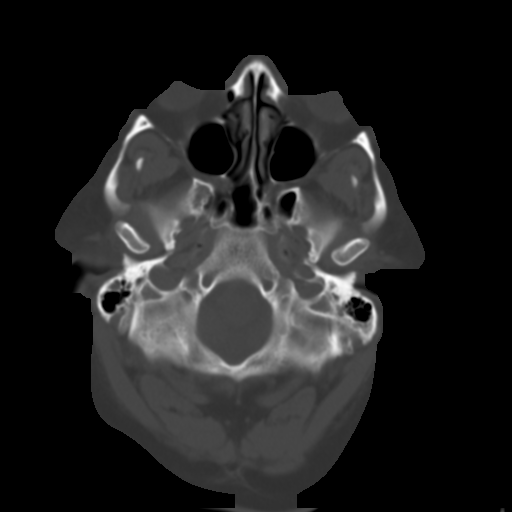
[im 7/32  brain]
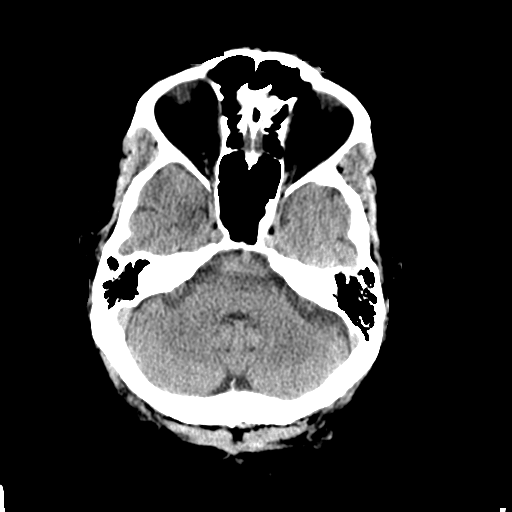
[im 10/32  brain]
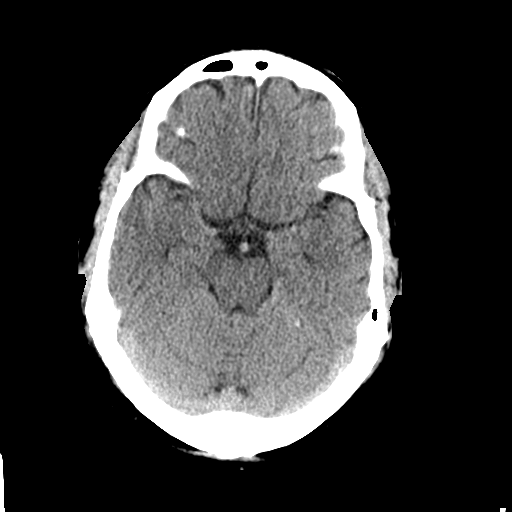
[im 14/32  brain]
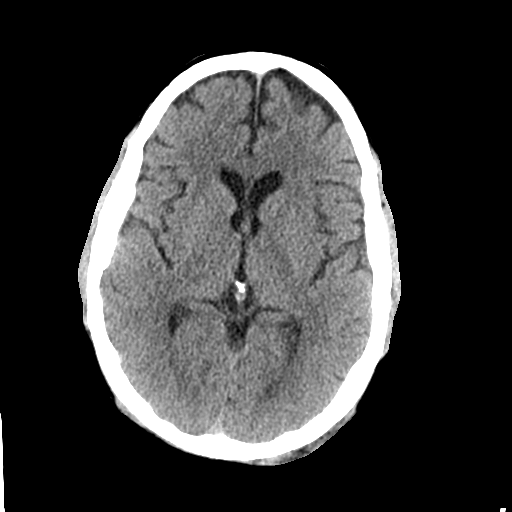
[im 18/32  brain]
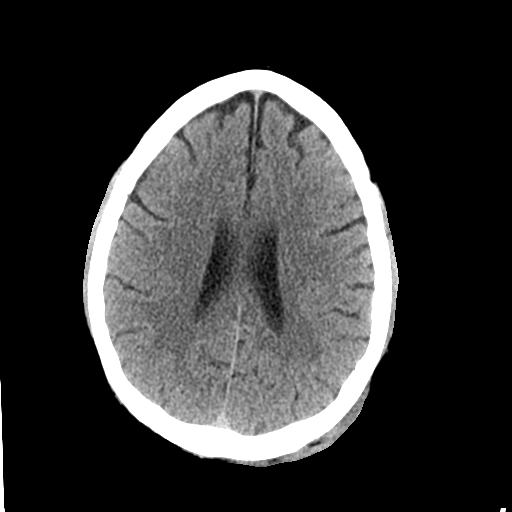
[im 18/32  bone]
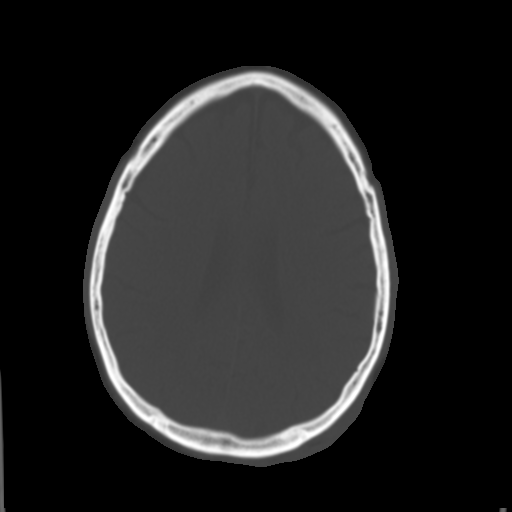
[im 22/32  brain]
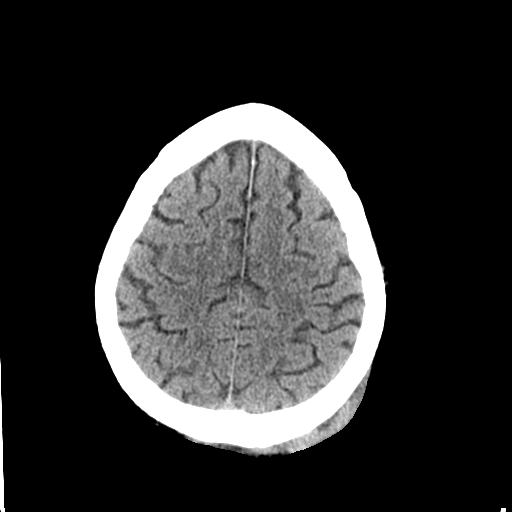
[im 25/32  brain]
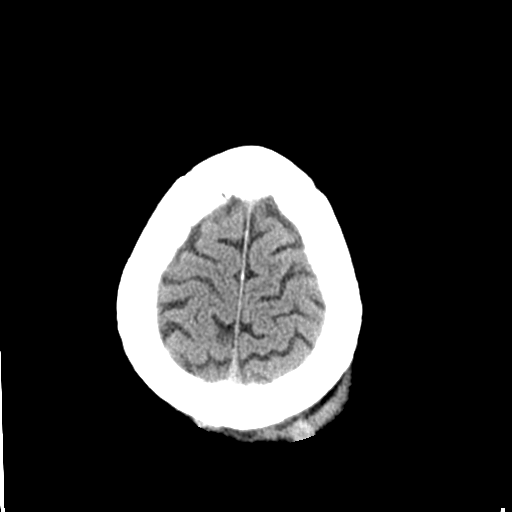
[im 29/32  brain]
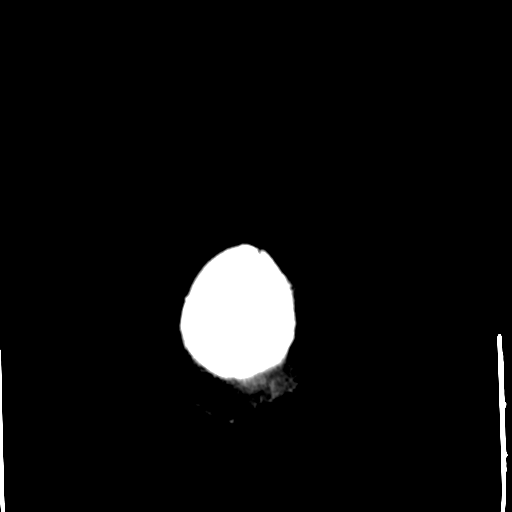

[Series 4: coronal soft · coronal · 0.31mm/px · 3 of 70 slices shown]
[im 24/70  brain]
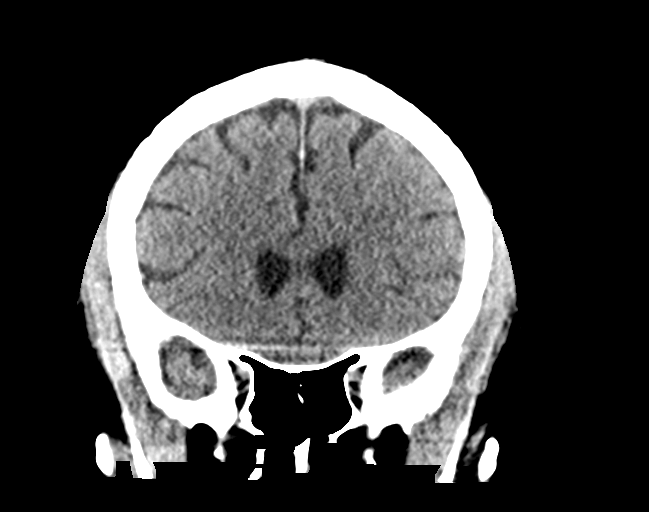
[im 31/70  brain]
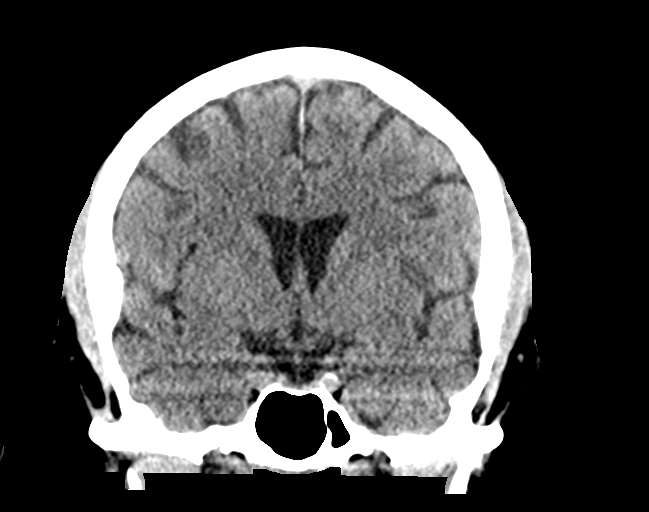
[im 39/70  brain]
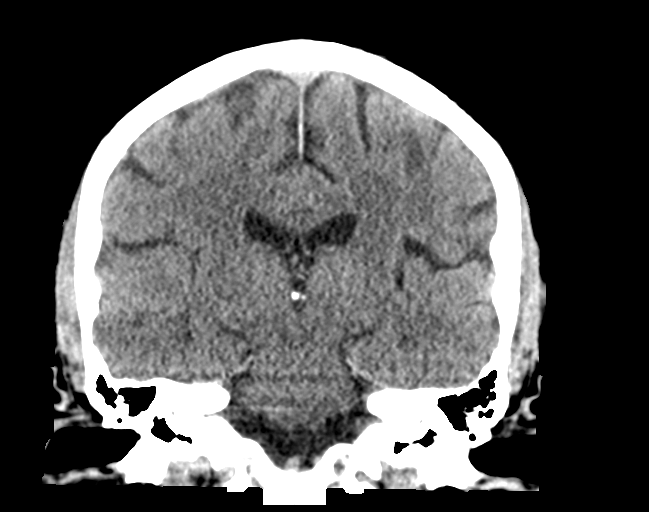

[Series 5: sagittal soft · sagittal · 0.31mm/px · 3 of 55 slices shown]
[im 19/55  brain]
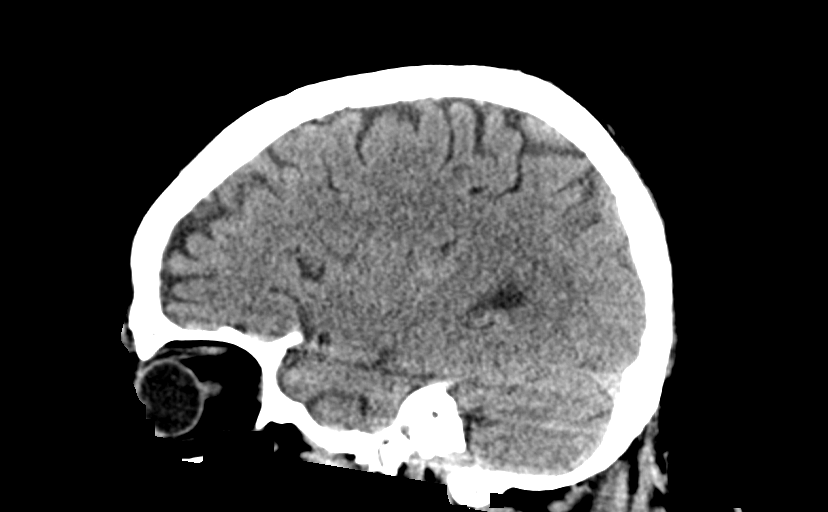
[im 28/55  brain]
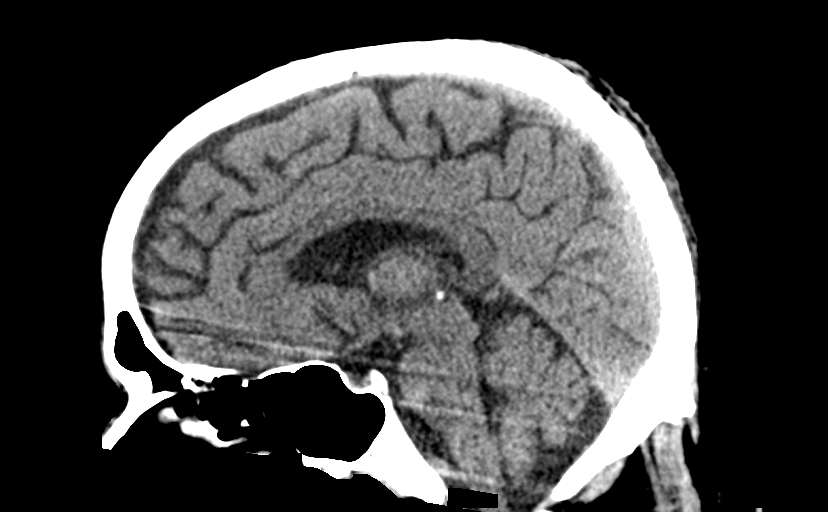
[im 37/55  brain]
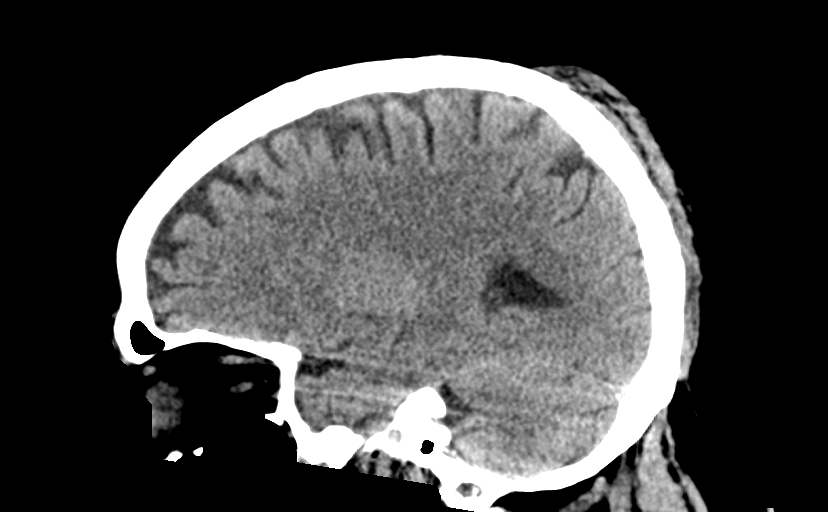

[14 of 47 positions shown; findings below may reference images not displayed]

FINDINGS: CT HEAD FINDINGS

Brain: No evidence of acute infarction, hemorrhage, hydrocephalus,
extra-axial collection or mass lesion/mass effect.

Vascular: No hyperdense vessel or unexpected calcification.

Skull: Normal. Negative for fracture or focal lesion.

Sinuses/Orbits: No acute finding.

Other: Large scalp hematoma is noted posteriorly on the left
consistent with the recent injury.

CT CERVICAL SPINE FINDINGS

Alignment: Loss of the normal cervical lordosis is noted. Mild
degenerative anterolisthesis of C4 on C5 is noted.

Skull base and vertebrae: 7 cervical segments are well visualized.
Multilevel disc space narrowing is noted from C5 to T1 with
osteophytic changes. Facet hypertrophic changes are noted worst at
C4-5 with the resultant anterolisthesis. The odontoid is within
normal limits. No acute fracture or acute facet abnormality is
noted.

Soft tissues and spinal canal: Surrounding soft tissue structures
show vascular calcifications.

Upper chest: Visualized lung apices are within normal limits.

Other: None
IMPRESSION: CT of the head: No acute intracranial abnormality noted.

Large posterior scalp hematoma on the left related to the recent
injury.

CT of the cervical spine: Degenerative changes with anterolisthesis
of C4 on C5. No acute abnormality is noted.

## 2022-03-04 MED ORDER — PEG 3350-KCL-NA BICARB-NACL 420 G PO SOLR: 4000.0000 mL | Freq: Once | ORAL | 0 refills | Status: AC

## 2022-03-04 NOTE — Patient Instructions (Signed)
For colon cancer screening purposes, we will schedule you for colonoscopy today.  For your chronic acid reflux, continue on Nexium as needed.  In regards to your abnormal liver function tests, likely this is a combination of fatty liver worsened by alcohol use.  Continue to work on alcohol cessation.  I think it is great that you have cut down immensely in regards to this.  I am going to check blood work today at [facility] to further work-up.  This will be a one-time necessary step to rule out other causes of abnormal liver test.  I am also going to check an ultrasound with elastography at [facility].  Recommend 1-2# weight loss per week until ideal body weight through exercise & diet. Low fat/cholesterol diet.   Avoid sweets, sodas, fruit juices, sweetened beverages like tea, etc. Gradually increase exercise from 15 min daily up to 1 hr per day 5 days/week. Limit alcohol use.  Further recommendations to follow.  It was very nice meeting you today.  Dr. Abbey Chatters  Nonalcoholic Fatty Liver Disease Diet, Adult Nonalcoholic fatty liver disease is a condition that causes fat to build up in and around the liver. The disease makes it harder for the liver to work the way that it should. Following a healthy diet can help to keep nonalcoholic fatty liver disease under control. It can also help to prevent or improve conditions that are associated with the disease, such as heart disease, diabetes, high blood pressure, and abnormal cholesterol levels. Along with regular exercise, this diet: Promotes weight loss. Helps to control blood sugar levels. Helps to improve the way that the body uses insulin. What are tips for following this plan? Reading food labels  Always check food labels for: The amount of saturated fat in a food. You should limit your intake of saturated fat. Saturated fat is found in foods that come from animals, including meat and dairy products such as butter, cheese, and whole  milk. The amount of fiber in a food. You should choose high-fiber foods such as fruits, vegetables, and whole grains. Try to get 25-30 grams (g) of fiber a day.   Cooking When cooking, use heart-healthy oils that are high in monounsaturated fats. These include olive oil, canola oil, and avocado oil. Limit frying or deep-frying foods. Cook foods using healthy methods such as baking, boiling, steaming, and grilling instead. Meal planning You may want to keep track of how many calories you take in. Eating the right amount of calories will help you achieve a healthy weight. Meeting with a registered dietitian can help you get started. Limit how often you eat takeout and fast food. These foods are usually very high in fat, salt, and sugar. Use the glycemic index (GI) to plan your meals. The index tells you how quickly a food will raise your blood sugar. Choose low-GI foods (GI less than 55). These foods take a longer time to raise blood sugar. A registered dietitian can help you identify foods lower on the GI scale. Lifestyle You may want to follow a Mediterranean diet. This diet includes a lot of vegetables, lean meats or fish, whole grains, fruits, and healthy oils and fats. What foods can I eat?    Fruits Bananas. Apples. Oranges. Grapes. Papaya. Mango. Pomegranate. Kiwi. Grapefruit. Cherries. Vegetables Lettuce. Spinach. Peas. Beets. Cauliflower. Cabbage. Broccoli. Carrots. Tomatoes. Squash. Eggplant. Herbs. Peppers. Onions. Cucumbers. Brussels sprouts. Yams and sweet potatoes. Beans. Lentils. Grains Whole wheat or whole-grain foods, including breads, crackers, cereals, and pasta.  Stone-ground whole wheat. Unsweetened oatmeal. Bulgur. Barley. Quinoa. Brown or wild rice. Corn or whole wheat flour tortillas. Meats and other proteins Lean meats. Poultry. Tofu. Seafood and shellfish. Dairy Low-fat or fat-free dairy products, such as yogurt, cottage cheese, or cheese. Beverages Water. Sugar-free  drinks. Tea. Coffee. Low-fat or skim milk. Milk alternatives, such as soy or almond milk. Real fruit juice. Fats and oils Avocado. Canola or olive oil. Nuts and nut butters. Seeds. Seasonings and condiments Mustard. Relish. Low-fat, low-sugar ketchup and barbecue sauce. Low-fat or fat-free mayonnaise. Sweets and desserts Sugar-free sweets. The items listed above may not be a complete list of foods and beverages you can eat. Contact a dietitian for more information. What foods should I limit or avoid? Meats and other proteins Limit red meat to 1-2 times a week. Dairy [employer]. Fats and oils Palm oil and coconut oil. Fried foods. Other foods Processed foods. Foods that contain a lot of salt or sodium. Sweets and desserts Sweets that contain sugar. Beverages Sweetened drinks, such as sweet tea, milkshakes, iced sweet drinks, and sodas. Alcohol. The items listed above may not be a complete list of foods and beverages you should avoid. Contact a dietitian for more information. Where to find more information The [employer] of Diabetes and Digestive and Kidney Diseases: [employer] Summary Nonalcoholic fatty liver disease is a condition that causes fat to build up in and around the liver. Following a healthy diet can help to keep nonalcoholic fatty liver disease under control. Your diet should be rich in fruits, vegetables, whole grains, and lean proteins. Limit your intake of saturated fat. Saturated fat is found in foods that come from animals, including meat and dairy products such as butter, cheese, and whole milk. This diet promotes weight loss, helps to control blood sugar levels, and helps to improve the way that the body uses insulin. This information is not intended to replace advice given to you by your health care provider. Make sure you discuss any questions you have with your health care provider. Document Revised: 10/13/2018 Document Reviewed: 07/13/2018 Elsevier  Patient Education  [place].

## 2022-03-04 NOTE — Progress Notes (Signed)
Primary Care Physician:  Celene Squibb, MD Primary Gastroenterologist:  Dr. Abbey Chatters  Chief Complaint  Patient presents with   Colonoscopy    Last one was 10+ years done at [facility] in [place].     HPI:   Shawn Velazquez is a 56 y.o. male who presents to the clinic today by referral from his PCP Kathrynn Speed for evaluation.  Patient with chronic GERD maintained on as needed over-the-counter Nexium.  No dysphagia odynophagia.  No epigastric or chest pain.  No melena hematochezia.  Due for screening colonoscopy.  States his last one was over 10 years ago.  States it was normal.  No family history of colorectal malignancy.  No unintentional weight loss.  No abdominal pain.  Has had chronically abnormal aminotransferases for over a year.  Last imaging I can see was a CT abdomen pelvis in 2015 which showed fatty liver.  Risk factors for fatty liver include obesity and diabetes.  Does note significant alcohol use history.  For nearly 3 years he was drinking multiple liquor drinks daily after work.  He states in the last 8 months he has immensely cut down on this having drinks 1-2 times per week.  No family history of liver disease.  No herbal supplements.  Recent LFTs showed alk phos 329, AST 88, ALT 64, T. bili 1.6, GGT 1014.  Denies any itching.  No family history of autoimmune disease.  Denies any exposure to viral hepatitis.  No IV drug use.  Past Medical History:  Diagnosis Date   Anxiety    Arthritis    affects hands, shoulder, neck, knees, hips, ankles, toes   Chronic back pain    Diabetes mellitus    diet controlled   Eczema    Fatty liver, alcoholic    GERD (gastroesophageal reflux disease)    Glaucoma    slight case   Hypertension    Psoriasis    Sleep apnea    Substance abuse ([place])    Vertigo 09/11/2017   Wears glasses     Past Surgical History:  Procedure Laterality Date   MULTIPLE TOOTH EXTRACTIONS     POSTERIOR CERVICAL LAMINECTOMY  04/21/2012    Procedure: POSTERIOR CERVICAL LAMINECTOMY;  Surgeon: Elaina Hoops, MD;  Location: [place] [facility];  Service: Neurosurgery;  Laterality: Left;  Posterior Cervical laminectomy/foraminotomy and diskectomy, left cervical seven-thoracic one   ROTATOR CUFF REPAIR     THORACIC DISCECTOMY Left 04/03/2014   Procedure: Left Thoracic Seven to Eight, Thoracic Eight to Nine Thoracic Discectomy ;  Surgeon: Elaina Hoops, MD;  Location: [place] [facility];  Service: Neurosurgery;  Laterality: Left;    Current Outpatient Medications  Medication Sig Dispense Refill   albuterol (VENTOLIN HFA) 108 (90 Base) MCG/ACT inhaler INHALE 2 PUFFS INTO THE LUNGS EVERY 6 HOURS AS NEEDED FOR WHEEZING OR SHORTNESS OF BREATH 20.1 g 0   amLODipine (NORVASC) 5 MG tablet Take 1 tablet (5 mg total) by mouth daily. 90 tablet 1   Continuous Blood Gluc Sensor (DEXCOM G7 SENSOR) MISC Inject 1 application. into the skin as directed. Change sensor every 10 days as directed. 6 each 3   FLUoxetine (PROZAC) 10 MG capsule TAKE 4 CAPSULES BY MOUTH EVERY DAY 260 capsule 0   glucose blood test strip Use as instructed to monitor glucose 4 times daily 100 each 12   hydrochlorothiazide (HYDRODIURIL) 25 MG tablet Take 1 tablet (25 mg total) by mouth daily. 90 tablet 1   ibuprofen (  ADVIL) 800 MG tablet Take 1 tablet (800 mg total) by mouth every 8 (eight) hours as needed for moderate pain. For pain 270 tablet 0   insulin glargine (LANTUS SOLOSTAR) 100 UNIT/ML Solostar Pen Inject 40 Units into the skin at bedtime. 30 mL 3   insulin lispro (HUMALOG KWIKPEN) 100 UNIT/ML KwikPen Inject 8-14 Units into the skin 3 (three) times daily. 40 mL 3   Insulin Pen Needle (PEN NEEDLES) 32G X 4 MM MISC 1 each by Does not apply route in the morning, at noon, in the evening, and at bedtime. 100 each 11   losartan (COZAAR) 50 MG tablet Take 1 tablet (50 mg total) by mouth daily. 90 tablet 1   meclizine (ANTIVERT) 25 MG tablet Take 25 mg by mouth 3 (three) times daily as needed for  dizziness.     metFORMIN (GLUCOPHAGE-XR) 500 MG 24 hr tablet Take 1015m twice daily with meals 120 tablet 3   montelukast (SINGULAIR) 10 MG tablet TAKE 1 TABLET(10 MG) BY MOUTH AT BEDTIME 30 tablet 3   tiZANidine (ZANAFLEX) 2 MG tablet SMARTSIG:1 Tablet(s) By Mouth 1 to 3 Times Daily PRN     No current facility-administered medications for this visit.    Allergies as of 03/04/2022 - Review Complete 03/04/2022  Allergen Reaction Noted   Neosporin + pain relief max st [neomy-bacit-polymyx-pramoxine] Rash 04/19/2012    Family History  Problem Relation Age of Onset   Diabetes Father    Cancer - Lung Father    Hypertension Other    Cancer - Lung Other     Social History   Socioeconomic History   Marital status: Divorced    Spouse name: Not on file   Number of children: Not on file   Years of education: Not on file   Highest education level: Not on file  Occupational History    Comment: Truck to storage  Tobacco Use   Smoking status: Every Day    Packs/day: 0.25    Years: 40.00    Total pack years: 10.00    Types: E-cigarettes, Cigarettes    Last attempt to quit: 05/11/2021    Years since quitting: 0.8   Smokeless tobacco: Never   Tobacco comments:    pt trying to quit on his own  Vaping Use   Vaping Use: Former  Substance and Sexual Activity   Alcohol use: Yes    Alcohol/week: 12.0 standard drinks of alcohol    Types: 12 Cans of beer per week    Comment: Either Smirnoff Smash-4 per day or Fireball-10 airplane bottles in a day   Drug use: No   Sexual activity: Yes    Birth control/protection: None  Other Topics Concern   Not on file  Social History Narrative   Not on file   Social Determinants of Health   Financial Resource Strain: Not on file  Food Insecurity: Not on file  Transportation Needs: Not on file  Physical Activity: Not on file  Stress: Not on file  Social Connections: Not on file  Intimate Partner Violence: Not on file    Subjective: Review  of Systems  Constitutional:  Negative for chills and fever.  HENT:  Negative for congestion and hearing loss.   Eyes:  Negative for blurred vision and double vision.  Respiratory:  Negative for cough and shortness of breath.   Cardiovascular:  Negative for chest pain and palpitations.  Gastrointestinal:  Positive for heartburn. Negative for abdominal pain, blood in stool, constipation, diarrhea, melena  and vomiting.  Genitourinary:  Negative for dysuria and urgency.  Musculoskeletal:  Negative for joint pain and myalgias.  Skin:  Negative for itching and rash.  Neurological:  Negative for dizziness and headaches.  Psychiatric/Behavioral:  Negative for depression. The patient is not nervous/anxious.        Objective: BP 115/69 (BP Location: Right Arm, Patient Position: Sitting, Cuff Size: Normal)   Pulse 90   Temp (!) 97.5 F (36.4 C) (Temporal)   Ht _0  (1.676 m)   Wt 188 lb 3.2 oz (85.4 kg)   SpO2 98%   BMI 30.38 kg/m  Physical Exam Constitutional:      Appearance: Normal appearance.  HENT:     Head: Normocephalic and atraumatic.  Eyes:     Extraocular Movements: Extraocular movements intact.     Conjunctiva/sclera: Conjunctivae normal.  Cardiovascular:     Rate and Rhythm: Normal rate and regular rhythm.  Pulmonary:     Effort: Pulmonary effort is normal.     Breath sounds: Normal breath sounds.  Abdominal:     General: Bowel sounds are normal.     Palpations: Abdomen is soft.  Musculoskeletal:        General: Normal range of motion.     Cervical back: Normal range of motion and neck supple.  Skin:    General: Skin is warm.  Neurological:     General: No focal deficit present.     Mental Status: He is alert and oriented to person, place, and time.  Psychiatric:        Mood and Affect: Mood normal.        Behavior: Behavior normal.      Assessment: *Chronic GERD *Colon cancer screening *Abnormal LFTs *Fatty liver   Plan: Chronic GERD, mild,  intermittent.  Well-controlled on as needed over-the-counter Nexium.  We will continue.  No alarm symptoms today to warrant further investigation with upper endoscopy.  Will schedule for screening colonoscopy.The risks including infection, bleed, or perforation as well as benefits, limitations, alternatives and imponderables have been reviewed with the patient. Questions have been answered. All parties agreeable.  In regards to his abnormal liver function test, likely this is a combination of fatty liver disease related to obesity, diabetes, worsened in the setting of chronic alcohol use.  Congratulated patient on decreasing his alcohol consumption immensely.  Recommended cessation today.  We will perform full serological work-up to rule out other causes.  Ultrasound with elastography ordered.  Recommend 1-2# weight loss per week until ideal body weight through exercise & diet. Low fat/cholesterol diet.   Avoid sweets, sodas, fruit juices, sweetened beverages like tea, etc. Gradually increase exercise from 15 min daily up to 1 hr per day 5 days/week. Limit alcohol use.  Thank you Vena Rua for the kind referral.   03/04/2022 8:16 AM   Disclaimer: This note was dictated with voice recognition software. Similar sounding words can inadvertently be transcribed and may not be corrected upon review.

## 2022-03-09 DIAGNOSIS — R7989 Other specified abnormal findings of blood chemistry: Secondary | ICD-10-CM | POA: Diagnosis not present

## 2022-03-09 DIAGNOSIS — K219 Gastro-esophageal reflux disease without esophagitis: Secondary | ICD-10-CM | POA: Diagnosis not present

## 2022-03-09 DIAGNOSIS — K760 Fatty (change of) liver, not elsewhere classified: Secondary | ICD-10-CM | POA: Diagnosis not present

## 2022-03-09 DIAGNOSIS — Z1211 Encounter for screening for malignant neoplasm of colon: Secondary | ICD-10-CM | POA: Diagnosis not present

## 2022-03-11 ENCOUNTER — Encounter: Payer: BC Managed Care – PPO | Admitting: Nurse Practitioner

## 2022-03-12 LAB — COMPREHENSIVE METABOLIC PANEL
ALT: 58 IU/L — ABNORMAL HIGH (ref 0–44)
AST: 111 IU/L — ABNORMAL HIGH (ref 0–40)
Albumin/Globulin Ratio: 1.2 (ref 1.2–2.2)
Albumin: 4 g/dL (ref 3.8–4.9)
Alkaline Phosphatase: 188 IU/L — ABNORMAL HIGH (ref 44–121)
BUN/Creatinine Ratio: 5 — ABNORMAL LOW (ref 9–20)
BUN: 5 mg/dL — ABNORMAL LOW (ref 6–24)
Bilirubin Total: 1.3 mg/dL — ABNORMAL HIGH (ref 0.0–1.2)
CO2: 22 mmol/L (ref 20–29)
Calcium: 9.6 mg/dL (ref 8.7–10.2)
Chloride: 100 mmol/L (ref 96–106)
Creatinine, Ser: 1 mg/dL (ref 0.76–1.27)
Globulin, Total: 3.3 g/dL (ref 1.5–4.5)
Glucose: 171 mg/dL — ABNORMAL HIGH (ref 70–99)
Potassium: 3.2 mmol/L — ABNORMAL LOW (ref 3.5–5.2)
Sodium: 139 mmol/L (ref 134–144)
Total Protein: 7.3 g/dL (ref 6.0–8.5)
eGFR: 88 mL/min/{1.73_m2} (ref >59)

## 2022-03-12 LAB — ANTI-SMOOTH MUSCLE ANTIBODY, IGG: Smooth Muscle Ab: 8 Units (ref 0–19)

## 2022-03-12 LAB — IMMUNOGLOBULINS A/E/G/M, SERUM
IgE (Immunoglobulin E), Serum: 25 IU/mL (ref 6–495)
IgG (Immunoglobin G), Serum: 1410 mg/dL (ref 603–1613)
IgM (Immunoglobulin M), Srm: 248 mg/dL — ABNORMAL HIGH (ref 20–172)

## 2022-03-12 LAB — ANA: ANA Titer 1: NEGATIVE

## 2022-03-12 LAB — ACUTE VIRAL HEPATITIS (HAV, HBV, HCV)
HCV Ab: NONREACTIVE
Hep A IgM: NEGATIVE
Hep B C IgM: NEGATIVE
Hepatitis B Surface Ag: NEGATIVE

## 2022-03-12 LAB — MITOCHONDRIAL ANTIBODIES: Mitochondrial Ab: <20 Units (ref 0.0–20.0)

## 2022-03-12 LAB — IRON,TIBC AND FERRITIN PANEL
Ferritin: 352 ng/mL (ref 30–400)
Iron Saturation: 18 % (ref 15–55)
Iron: 55 ug/dL (ref 38–169)
Total Iron Binding Capacity: 305 ug/dL (ref 250–450)
UIBC: 250 ug/dL (ref 111–343)

## 2022-03-12 LAB — PROTIME-INR
INR: 1.2 (ref 0.9–1.2)
Prothrombin Time: 12.7 s — ABNORMAL HIGH (ref 9.1–12.0)

## 2022-03-12 LAB — HCV INTERPRETATION

## 2022-03-12 LAB — CELIAC AB TTG DGP TIGA
Antigliadin Abs, IgA: 18 units (ref 0–19)
Gliadin IgG: 5 units (ref 0–19)
IgA/Immunoglobulin A, Serum: 725 mg/dL — ABNORMAL HIGH (ref 90–386)

## 2022-03-12 LAB — HEPATITIS B SURFACE ANTIBODY,QUALITATIVE: Hep B Surface Ab, Qual: NONREACTIVE

## 2022-03-12 LAB — TISSUE TRANSGLUTAMINASE ABS,IGG,IGA
Tissue Transglut Ab: 3 U/mL (ref 0–5)
Transglutaminase IgA: <2 U/mL (ref 0–3)

## 2022-03-12 LAB — ALPHA-1-ANTITRYPSIN: A-1 Antitrypsin: 156 mg/dL (ref 101–187)

## 2022-03-22 ENCOUNTER — Ambulatory Visit (HOSPITAL_COMMUNITY)
Admission: RE | Admit: 2022-03-22 | Discharge: 2022-03-22 | Disposition: A | Discharge status: Home / Self Care | Payer: BC Managed Care – PPO | Source: Ambulatory Visit | Attending: Internal Medicine | Admitting: Internal Medicine

## 2022-03-22 DIAGNOSIS — K760 Fatty (change of) liver, not elsewhere classified: Secondary | ICD-10-CM | POA: Diagnosis not present

## 2022-03-22 DIAGNOSIS — K828 Other specified diseases of gallbladder: Secondary | ICD-10-CM | POA: Diagnosis not present

## 2022-03-26 NOTE — Patient Instructions (Signed)
Shawn Velazquez  03/26/2022     '@PREFPERIOPPHARMACY'$ @   Your procedure is scheduled on  04/01/2022.   Report to [name] at  0830 A.M.   Call this number if you have problems the morning of surgery:  [phone]   Remember:  Follow the diet and prep instructions given to you by the office.      Take 20 units of your night time insulin the night before your procedure.     DO NOT take any medications for diabetes the morning of your procedure.     Bring extra Dexcom supplies with you.     Take these medicines the morning of surgery with A SIP OF WATER                amlodipine, nexium, prozac, antivert, zanaflex (if needed).     Do not wear jewelry, make-up or nail polish.  Do not wear lotions, powders, or perfumes, or deodorant.  Do not shave 48 hours prior to surgery.  Men may shave face and neck.  Do not bring valuables to the hospital.  [facility] is not responsible for any belongings or valuables.  Contacts, dentures or bridgework may not be worn into surgery.  Leave your suitcase in the car.  After surgery it may be brought to your room.  For patients admitted to the hospital, discharge time will be determined by your treatment team.  Patients discharged the day of surgery will not be allowed to drive home and must have someone with them for for 24 hours.     Special instructions:   DO NOT smoke tobacco or vape for 24 hours before your procedure.  Please read over the following fact sheets that you were given. Anesthesia Post-op Instructions and Care and Recovery After Surgery      Colonoscopy, Adult, Care After The following information offers guidance on how to care for yourself after your procedure. Your health care provider may also give you more specific instructions. If you have problems or questions, contact your health care provider. What can I expect after the procedure? After the procedure, it is common to have: A small amount of blood  in your stool for 24 hours after the procedure. Some gas. Mild cramping or bloating of your abdomen. Follow these instructions at home: Eating and drinking  Drink enough fluid to keep your urine pale yellow. Follow instructions from your health care provider about eating or drinking restrictions. Resume your normal diet as told by your health care provider. Avoid heavy or fried foods that are hard to digest. Activity Rest as told by your health care provider. Avoid sitting for a long time without moving. Get up to take short walks every 1-2 hours. This is important to improve blood flow and breathing. Ask for help if you feel weak or unsteady. Return to your normal activities as told by your health care provider. Ask your health care provider what activities are safe for you. Managing cramping and bloating  Try walking around when you have cramps or feel bloated. If directed, apply heat to your abdomen as told by your health care provider. Use the heat source that your health care provider recommends, such as a moist heat pack or a heating pad. Place a towel between your skin and the heat source. Leave the heat on for 20-30 minutes. Remove the heat if your skin turns bright red. This is especially important if you are unable to  feel pain, heat, or cold. You have a greater risk of getting burned. General instructions If you were given a sedative during the procedure, it can affect you for several hours. Do not drive or operate machinery until your health care provider says that it is safe. For the first 24 hours after the procedure: Do not sign important documents. Do not drink alcohol. Do your regular daily activities at a slower pace than normal. Eat soft foods that are easy to digest. Take over-the-counter and prescription medicines only as told by your health care provider. Keep all follow-up visits. This is important. Contact a health care provider if: You have blood in your stool  2-3 days after the procedure. Get help right away if: You have more than a small spotting of blood in your stool. You have large blood clots in your stool. You have swelling of your abdomen. You have nausea or vomiting. You have a fever. You have increasing pain in your abdomen that is not relieved with medicine. These symptoms may be an emergency. Get help right away. Call 911. Do not wait to see if the symptoms will go away. Do not drive yourself to the hospital. Summary After the procedure, it is common to have a small amount of blood in your stool. You may also have mild cramping and bloating of your abdomen. If you were given a sedative during the procedure, it can affect you for several hours. Do not drive or operate machinery until your health care provider says that it is safe. Get help right away if you have a lot of blood in your stool, nausea or vomiting, a fever, or increased pain in your abdomen. This information is not intended to replace advice given to you by your health care provider. Make sure you discuss any questions you have with your health care provider. Document Revised: 02/11/2021 Document Reviewed: 02/11/2021 Elsevier Patient Education  [place] After This sheet gives you information about how to care for yourself after your procedure. Your health care provider may also give you more specific instructions. If you have problems or questions, contact your health care provider. What can I expect after the procedure? After the procedure, it is common to have: Tiredness. Forgetfulness about what happened after the procedure. Impaired judgment for important decisions. Nausea or vomiting. Some difficulty with balance. Follow these instructions at home: For the time period you were told by your health care provider:     Rest as needed. Do not participate in activities where you could fall or become injured. Do not drive or  use machinery. Do not drink alcohol. Do not take sleeping pills or medicines that cause drowsiness. Do not make important decisions or sign legal documents. Do not take care of children on your own. Eating and drinking Follow the diet that is recommended by your health care provider. Drink enough fluid to keep your urine pale yellow. If you vomit: Drink water, juice, or soup when you can drink without vomiting. Make sure you have little or no nausea before eating solid foods. General instructions Have a responsible adult stay with you for the time you are told. It is important to have someone help care for you until you are awake and alert. Take over-the-counter and prescription medicines only as told by your health care provider. If you have sleep apnea, surgery and certain medicines can increase your risk for breathing problems. Follow instructions from your health care provider about wearing  your sleep device: Anytime you are sleeping, including during daytime naps. While taking prescription pain medicines, sleeping medicines, or medicines that make you drowsy. Avoid smoking. Keep all follow-up visits as told by your health care provider. This is important. Contact a health care provider if: You keep feeling nauseous or you keep vomiting. You feel light-headed. You are still sleepy or having trouble with balance after 24 hours. You develop a rash. You have a fever. You have redness or swelling around the IV site. Get help right away if: You have trouble breathing. You have new-onset confusion at home. Summary For several hours after your procedure, you may feel tired. You may also be forgetful and have poor judgment. Have a responsible adult stay with you for the time you are told. It is important to have someone help care for you until you are awake and alert. Rest as told. Do not drive or operate machinery. Do not drink alcohol or take sleeping pills. Get help right away if you  have trouble breathing, or if you suddenly become confused. This information is not intended to replace advice given to you by your health care provider. Make sure you discuss any questions you have with your health care provider. Document Revised: 05/26/2021 Document Reviewed: 05/24/2019 Elsevier Patient Education  [place].

## 2022-03-30 ENCOUNTER — Encounter (HOSPITAL_COMMUNITY)
Admission: RE | Admit: 2022-03-30 | Discharge: 2022-03-30 | Disposition: A | Discharge status: Home / Self Care | Payer: BC Managed Care – PPO | Source: Ambulatory Visit | Attending: Internal Medicine | Admitting: Internal Medicine

## 2022-03-30 ENCOUNTER — Encounter: Payer: Self-pay | Admitting: *Deleted

## 2022-03-30 DIAGNOSIS — I10 Essential (primary) hypertension: Secondary | ICD-10-CM

## 2022-03-31 ENCOUNTER — Encounter (HOSPITAL_COMMUNITY): Admission: RE | Admit: 2022-03-31 | Payer: BC Managed Care – PPO | Source: Ambulatory Visit

## 2022-04-01 ENCOUNTER — Encounter (HOSPITAL_COMMUNITY)
Admission: RE | Admit: 2022-04-01 | Discharge: 2022-04-01 | Disposition: A | Discharge status: Home / Self Care | Payer: BC Managed Care – PPO | Source: Ambulatory Visit | Attending: Internal Medicine | Admitting: Internal Medicine

## 2022-04-01 DIAGNOSIS — Z01810 Encounter for preprocedural cardiovascular examination: Secondary | ICD-10-CM | POA: Insufficient documentation

## 2022-04-01 DIAGNOSIS — I10 Essential (primary) hypertension: Secondary | ICD-10-CM | POA: Insufficient documentation

## 2022-04-01 NOTE — Patient Instructions (Addendum)
Shawn Velazquez  04/01/2022     '@PREFPERIOPPHARMACY'$ @   Your procedure is scheduled on Monday, 04/05/22.  Report to [facility] at 1000 A.M.  Call this number if you have problems the morning of surgery:  [phone]   Remember:  Do not eat or drink after midnight.      Take these medicines the morning of surgery with A SIP OF WATER amlodipine, nexium, prozac. Zanaflex and meclizine if needed. Take 1/2 of your usual Lantus (20 units) on Sunday night.    Do not wear jewelry, make-up or nail polish.  Do not wear lotions, powders, or perfumes, or deodorant.  Do not shave 48 hours prior to surgery.  Men may shave face and neck.  Do not bring valuables to the hospital.  [facility] is not responsible for any belongings or valuables.  Contacts, dentures or bridgework may not be worn into surgery.  Leave your suitcase in the car.  After surgery it may be brought to your room.  For patients admitted to the hospital, discharge time will be determined by your treatment team.  Patients discharged the day of surgery will not be allowed to drive home.   Name and phone number of your driver:   To be determined  Special instructions:  Follow diet and prep instructions given to you from office.  Please read over the following fact sheets that you were given. Care and Recovery After Surgery      Colonoscopy, Adult A colonoscopy is a procedure to look at the entire large intestine. This procedure is done using a long, thin, flexible tube that has a camera on the end. You may have a colonoscopy: As a part of normal colorectal screening. If you have certain symptoms, such as: A low number of red blood cells in your blood (anemia). Diarrhea that does not go away. Pain in your abdomen. Blood in your stool. A colonoscopy can help screen for and diagnose medical problems, including: An abnormal growth of cells or tissue (tumor). Abnormal growths within the lining of your intestine  (polyps). Inflammation. Areas of bleeding. Tell your health care provider about: Any allergies you have. All medicines you are taking, including vitamins, herbs, eye drops, creams, and over-the-counter medicines. Any problems you or family members have had with anesthetic medicines. Any bleeding problems you have. Any surgeries you have had. Any medical conditions you have. Any problems you have had with having bowel movements. Whether you are pregnant or may be pregnant. What are the risks? Generally, this is a safe procedure. However, problems may occur, including: Bleeding. Damage to your intestine. Allergic reactions to medicines given during the procedure. Infection. This is rare. What happens before the procedure? Eating and drinking restrictions Follow instructions from your health care provider about eating or drinking restrictions, which may include: A few days before the procedure: Follow a low-fiber diet. Avoid nuts, seeds, dried fruit, raw fruits, and vegetables. 1-3 days before the procedure: Eat only gelatin dessert or ice pops. Drink only clear liquids, such as water, clear juice, clear broth or bouillon, black coffee or tea, or clear soft drinks or sports drinks. Avoid liquids that contain red or purple dye. The day of the procedure: Do not eat solid foods. You may continue to drink clear liquids until up to 2 hours before the procedure. Do not eat or drink anything starting 2 hours before the procedure, or within the time period that your health care provider recommends. Bowel prep If you  were prescribed a bowel prep to take by mouth (orally) to clean out your colon: Take it as told by your health care provider. Starting the day before your procedure, you will need to drink a large amount of liquid medicine. The liquid will cause you to have many bowel movements of loose stool until your stool becomes almost clear or light green. If your skin or the opening between  the buttocks (anus) gets irritated from diarrhea, you may relieve the irritation using: Wipes with medicine in them, such as adult wet wipes with aloe and vitamin E. A product to soothe skin, such as petroleum jelly. If you vomit while drinking the bowel prep: Take a break for up to 60 minutes. Begin the bowel prep again. Call your health care provider if you keep vomiting or you cannot take the bowel prep without vomiting. To clean out your colon, you may also be given: Laxative medicines. These help you have a bowel movement. Instructions for enema use. An enema is liquid medicine injected into your rectum. Medicines Ask your health care provider about: Changing or stopping your regular medicines or supplements. This is especially important if you are taking iron supplements, diabetes medicines, or blood thinners. Taking medicines such as aspirin and ibuprofen. These medicines can thin your blood. Do not take these medicines unless your health care provider tells you to take them. Taking over-the-counter medicines, vitamins, herbs, and supplements. General instructions Ask your health care provider what steps will be taken to help prevent infection. These may include washing skin with a germ-killing soap. If you will be going home right after the procedure, plan to have a responsible adult: Take you home from the hospital or clinic. You will not be allowed to drive. Care for you for the time you are told. What happens during the procedure?  An IV will be inserted into one of your veins. You will be given a medicine to make you fall asleep (general anesthetic). You will lie on your side with your knees bent. A lubricant will be put on the tube. Then the tube will be: Inserted into your anus. Gently eased through all parts of your large intestine. Air will be sent into your colon to keep it open. This may cause some pressure or cramping. Images will be taken with the camera and will  appear on a screen. A small tissue sample may be removed to be looked at under a microscope (biopsy). The tissue may be sent to a lab for testing if any signs of problems are found. If small polyps are found, they may be removed and checked for cancer cells. When the procedure is finished, the tube will be removed. The procedure may vary among health care providers and hospitals. What happens after the procedure? Your blood pressure, heart rate, breathing rate, and blood oxygen level will be monitored until you leave the hospital or clinic. You may have a small amount of blood in your stool. You may pass gas and have mild cramping or bloating in your abdomen. This is caused by the air that was used to open your colon during the exam. If you were given a sedative during the procedure, it can affect you for several hours. Do not drive or operate machinery until your health care provider says that it is safe. It is up to you to get the results of your procedure. Ask your health care provider, or the department that is doing the procedure, when your results will be  ready. Summary A colonoscopy is a procedure to look at the entire large intestine. Follow instructions from your health care provider about eating and drinking before the procedure. If you were prescribed an oral bowel prep to clean out your colon, take it as told by your health care provider. During the colonoscopy, a flexible tube with a camera on its end is inserted into the anus and then passed into all parts of the large intestine. This information is not intended to replace advice given to you by your health care provider. Make sure you discuss any questions you have with your health care provider. Document Revised: 06/15/2021 Document Reviewed: 02/11/2021 Elsevier Patient Education  [place]. Colonoscopy, Adult, Care After The following information offers guidance on how to care for yourself after your procedure. Your  health care provider may also give you more specific instructions. If you have problems or questions, contact your health care provider. What can I expect after the procedure? After the procedure, it is common to have: A small amount of blood in your stool for 24 hours after the procedure. Some gas. Mild cramping or bloating of your abdomen. Follow these instructions at home: Eating and drinking  Drink enough fluid to keep your urine pale yellow. Follow instructions from your health care provider about eating or drinking restrictions. Resume your normal diet as told by your health care provider. Avoid heavy or fried foods that are hard to digest. Activity Rest as told by your health care provider. Avoid sitting for a long time without moving. Get up to take short walks every 1-2 hours. This is important to improve blood flow and breathing. Ask for help if you feel weak or unsteady. Return to your normal activities as told by your health care provider. Ask your health care provider what activities are safe for you. Managing cramping and bloating  Try walking around when you have cramps or feel bloated. If directed, apply heat to your abdomen as told by your health care provider. Use the heat source that your health care provider recommends, such as a moist heat pack or a heating pad. Place a towel between your skin and the heat source. Leave the heat on for 20-30 minutes. Remove the heat if your skin turns bright red. This is especially important if you are unable to feel pain, heat, or cold. You have a greater risk of getting burned. General instructions If you were given a sedative during the procedure, it can affect you for several hours. Do not drive or operate machinery until your health care provider says that it is safe. For the first 24 hours after the procedure: Do not sign important documents. Do not drink alcohol. Do your regular daily activities at a slower pace than  normal. Eat soft foods that are easy to digest. Take over-the-counter and prescription medicines only as told by your health care provider. Keep all follow-up visits. This is important. Contact a health care provider if: You have blood in your stool 2-3 days after the procedure. Get help right away if: You have more than a small spotting of blood in your stool. You have large blood clots in your stool. You have swelling of your abdomen. You have nausea or vomiting. You have a fever. You have increasing pain in your abdomen that is not relieved with medicine. These symptoms may be an emergency. Get help right away. Call 911. Do not wait to see if the symptoms will go away. Do not drive  yourself to the hospital. Summary After the procedure, it is common to have a small amount of blood in your stool. You may also have mild cramping and bloating of your abdomen. If you were given a sedative during the procedure, it can affect you for several hours. Do not drive or operate machinery until your health care provider says that it is safe. Get help right away if you have a lot of blood in your stool, nausea or vomiting, a fever, or increased pain in your abdomen. This information is not intended to replace advice given to you by your health care provider. Make sure you discuss any questions you have with your health care provider. Document Revised: 02/11/2021 Document Reviewed: 02/11/2021 Elsevier Patient Education  [place].

## 2022-04-02 ENCOUNTER — Encounter (HOSPITAL_COMMUNITY): Payer: BC Managed Care – PPO

## 2022-04-05 ENCOUNTER — Ambulatory Visit (HOSPITAL_COMMUNITY): Payer: BC Managed Care – PPO | Admitting: Anesthesiology

## 2022-04-05 ENCOUNTER — Encounter (HOSPITAL_COMMUNITY)
Admission: RE | Disposition: A | Discharge status: Home / Self Care | Payer: Self-pay | Source: Ambulatory Visit | Attending: Internal Medicine

## 2022-04-05 ENCOUNTER — Other Ambulatory Visit: Payer: Self-pay

## 2022-04-05 ENCOUNTER — Ambulatory Visit (HOSPITAL_COMMUNITY)
Admission: RE | Admit: 2022-04-05 | Discharge: 2022-04-05 | Disposition: A | Discharge status: Home / Self Care | Payer: BC Managed Care – PPO | Source: Ambulatory Visit | Attending: Internal Medicine | Admitting: Internal Medicine

## 2022-04-05 ENCOUNTER — Encounter (HOSPITAL_COMMUNITY): Payer: Self-pay

## 2022-04-05 DIAGNOSIS — I10 Essential (primary) hypertension: Secondary | ICD-10-CM | POA: Diagnosis not present

## 2022-04-05 DIAGNOSIS — Z1211 Encounter for screening for malignant neoplasm of colon: Secondary | ICD-10-CM | POA: Insufficient documentation

## 2022-04-05 DIAGNOSIS — K219 Gastro-esophageal reflux disease without esophagitis: Secondary | ICD-10-CM | POA: Insufficient documentation

## 2022-04-05 DIAGNOSIS — F17290 Nicotine dependence, other tobacco product, uncomplicated: Secondary | ICD-10-CM | POA: Diagnosis not present

## 2022-04-05 DIAGNOSIS — Z139 Encounter for screening, unspecified: Secondary | ICD-10-CM | POA: Diagnosis not present

## 2022-04-05 DIAGNOSIS — E119 Type 2 diabetes mellitus without complications: Secondary | ICD-10-CM | POA: Diagnosis not present

## 2022-04-05 DIAGNOSIS — K648 Other hemorrhoids: Secondary | ICD-10-CM | POA: Diagnosis not present

## 2022-04-05 DIAGNOSIS — D123 Benign neoplasm of transverse colon: Secondary | ICD-10-CM | POA: Diagnosis not present

## 2022-04-05 DIAGNOSIS — D124 Benign neoplasm of descending colon: Secondary | ICD-10-CM | POA: Diagnosis not present

## 2022-04-05 DIAGNOSIS — M1990 Unspecified osteoarthritis, unspecified site: Secondary | ICD-10-CM | POA: Insufficient documentation

## 2022-04-05 DIAGNOSIS — D120 Benign neoplasm of cecum: Secondary | ICD-10-CM | POA: Insufficient documentation

## 2022-04-05 DIAGNOSIS — K635 Polyp of colon: Secondary | ICD-10-CM | POA: Diagnosis not present

## 2022-04-05 DIAGNOSIS — I4510 Unspecified right bundle-branch block: Secondary | ICD-10-CM | POA: Insufficient documentation

## 2022-04-05 DIAGNOSIS — F17210 Nicotine dependence, cigarettes, uncomplicated: Secondary | ICD-10-CM | POA: Diagnosis not present

## 2022-04-05 DIAGNOSIS — Z7984 Long term (current) use of oral hypoglycemic drugs: Secondary | ICD-10-CM | POA: Diagnosis not present

## 2022-04-05 DIAGNOSIS — G4730 Sleep apnea, unspecified: Secondary | ICD-10-CM | POA: Insufficient documentation

## 2022-04-05 HISTORY — PX: COLONOSCOPY WITH PROPOFOL: SHX5780

## 2022-04-05 HISTORY — PX: POLYPECTOMY: SHX5525

## 2022-04-05 LAB — GLUCOSE, CAPILLARY: Glucose-Capillary: 173 mg/dL — ABNORMAL HIGH (ref 70–99)

## 2022-04-05 SURGERY — COLONOSCOPY WITH PROPOFOL
Anesthesia: General

## 2022-04-05 MED ORDER — LACTATED RINGERS IV SOLN: INTRAVENOUS | Status: DC

## 2022-04-05 MED ORDER — LIDOCAINE HCL (CARDIAC) PF 100 MG/5ML IV SOSY
PREFILLED_SYRINGE | INTRAVENOUS | Status: DC | PRN
  Administered 2022-04-05: 50 mg via INTRAVENOUS

## 2022-04-05 MED ORDER — PROPOFOL 10 MG/ML IV BOLUS
INTRAVENOUS | Status: DC | PRN
  Administered 2022-04-05: 100 mg via INTRAVENOUS
  Administered 2022-04-05: 50 mg via INTRAVENOUS

## 2022-04-05 MED ORDER — PROPOFOL 500 MG/50ML IV EMUL
INTRAVENOUS | Status: DC | PRN
  Administered 2022-04-05: 150 ug/kg/min via INTRAVENOUS

## 2022-04-05 NOTE — Anesthesia Preprocedure Evaluation (Signed)
Anesthesia Evaluation  Patient identified by MRN, date of birth, ID band Patient awake    Reviewed: Allergy & Precautions, NPO status , Patient's Chart, lab work & pertinent test results  Airway Mallampati: II  TM Distance: >3 FB Neck ROM: Full   Comment: POSTERIOR CERVICAL LAMINECTOMY Dental  (+) Dental Advisory Given, Edentulous Upper, Edentulous Lower   Pulmonary sleep apnea , Current Smoker,    Pulmonary exam normal breath sounds clear to auscultation       Cardiovascular hypertension, Pt. on medications Normal cardiovascular exam Rhythm:Regular Rate:Normal  01-Apr-2022 13:39:51 [employer]-AP-300 ROUTINE RECORD 24-Oct-1965 (38 yr) Male Caucasian Vent. rate 95 BPM PR interval 170 ms QRS duration 116 ms QT/QTcB 398/500 ms P-R-T axes 58 -7 38 Normal sinus rhythm Incomplete right bundle branch block Minimal voltage criteria for LVH, may be normal variant ( R in aVL ) Prolonged QT Abnormal ECG When compared with ECG of 11-Sep-2017 17:35, No significant change since last tracing Confirmed by Rudean Haskell [phone]) on 04/01/2022 2:40:42 PM   Neuro/Psych PSYCHIATRIC DISORDERS Anxiety Depression negative neurological ROS     GI/Hepatic GERD  Medicated,(+) neg Cirrhosis    substance abuse  alcohol use, Hepatitis - (high LFTs)  Endo/Other  diabetes, Poorly Controlled, Type 2, Oral Hypoglycemic Agents  Renal/GU negative Renal ROS  negative genitourinary   Musculoskeletal  (+) Arthritis , Osteoarthritis,    Abdominal   Peds negative pediatric ROS (+)  Hematology negative hematology ROS (+)   Anesthesia Other Findings   Reproductive/Obstetrics negative OB ROS                            Anesthesia Physical Anesthesia Plan  ASA: 3  Anesthesia Plan: General   Post-op Pain Management: Minimal or no pain anticipated   Induction: Intravenous  PONV Risk Score and  Plan: Propofol infusion  Airway Management Planned: Nasal Cannula and Natural Airway  Additional Equipment:   Intra-op Plan:   Post-operative Plan:   Informed Consent: I have reviewed the patients History and Physical, chart, labs and discussed the procedure including the risks, benefits and alternatives for the proposed anesthesia with the patient or authorized representative who has indicated his/her understanding and acceptance.       Plan Discussed with: CRNA and Surgeon  Anesthesia Plan Comments:         Anesthesia Quick Evaluation

## 2022-04-05 NOTE — Op Note (Signed)
[facility] Patient Name: Shawn Velazquez Procedure Date: 04/05/2022 10:30 AM MRN: [id number] Date of Birth: 1966-06-02 Attending MD: Elon Alas. Abbey Chatters DO CSN: [id number] Age: 56 Admit Type: Outpatient Procedure:                Colonoscopy Indications:              Screening for colorectal malignant neoplasm Providers:                Elon Alas. Abbey Chatters, DO, Caprice Kluver, Everardo Pacific Referring MD:              Medicines:                See the Anesthesia note for documentation of the                            administered medications Complications:            No immediate complications. Estimated Blood Loss:     Estimated blood loss was minimal. Procedure:                Pre-Anesthesia Assessment:                           - The anesthesia plan was to use monitored                            anesthesia care (MAC).                           After obtaining informed consent, the colonoscope                            was passed under direct vision. Throughout the                            procedure, the patient's blood pressure, pulse, and                            oxygen saturations were monitored continuously. The                            [id number]-HQ190L ([id number]) scope was introduced through                            the anus and advanced to the the cecum, identified                            by appendiceal orifice and ileocecal valve. The                            colonoscopy was performed without difficulty. The                            patient tolerated the procedure well. The quality                            of  the bowel preparation was evaluated using the                            BBPS [facility] Bowel Preparation Scale) with scores                            of: Right Colon = 2 (minor amount of residual                            staining, small fragments of stool and/or opaque                            liquid, but mucosa seen well), Transverse Colon = 2                             (minor amount of residual staining, small fragments                            of stool and/or opaque liquid, but mucosa seen                            well) and Left Colon = 3 (entire mucosa seen well                            with no residual staining, small fragments of stool                            or opaque liquid). The total BBPS score equals 7.                            The quality of the bowel preparation was fair. Scope In: 10:41:00 AM Scope Out: 10:59:00 AM Scope Withdrawal Time: 0 hours 15 minutes 5 seconds  Total Procedure Duration: 0 hours 18 minutes 0 seconds  Findings:      The perianal and digital rectal examinations were normal.      Non-bleeding internal hemorrhoids were found during endoscopy.      Two sessile polyps were found in the cecum. The polyps were 2 to 3 mm in       size. These polyps were removed with a cold snare. Resection and       retrieval were complete.      Three sessile polyps were found in the transverse colon. The polyps were       4 to 8 mm in size. These polyps were removed with a cold snare.       Resection and retrieval were complete.      A 8 mm polyp was found in the descending colon. The polyp was sessile.       The polyp was removed with a cold snare. Resection and retrieval were       complete.      The exam was otherwise without abnormality. Impression:               - Preparation of the colon was fair.                           -  Non-bleeding internal hemorrhoids.                           - Two 2 to 3 mm polyps in the cecum, removed with a                            cold snare. Resected and retrieved.                           - Three 4 to 8 mm polyps in the transverse colon,                            removed with a cold snare. Resected and retrieved.                           - One 8 mm polyp in the descending colon, removed                            with a cold snare. Resected and retrieved.                            - The examination was otherwise normal. Moderate Sedation:      Per Anesthesia Care Recommendation:           - Patient has a contact number available for                            emergencies. The signs and symptoms of potential                            delayed complications were discussed with the                            patient. Return to normal activities tomorrow.                            Written discharge instructions were provided to the                            patient.                           - Resume previous diet.                           - Continue present medications.                           - Await pathology results.                           - Repeat colonoscopy in 3 - 5 years for                            surveillance.                           -  Return to GI clinic in 6 months. Procedure Code(s):        --- Professional ---                           [phone], Colonoscopy, flexible; with removal of                            tumor(s), polyp(s), or other lesion(s) by snare                            technique Diagnosis Code(s):        --- Professional ---                           K63.5, Polyp of colon                           Z12.11, Encounter for screening for malignant                            neoplasm of colon                           K64.8, Other hemorrhoids CPT copyright 2019 American Medical Association. All rights reserved. The codes documented in this report are preliminary and upon coder review may  be revised to meet current compliance requirements. Elon Alas. Abbey Chatters, DO Charlotte [place], DO 04/05/2022 11:04:17 AM This report has been signed electronically. Number of Addenda: 0

## 2022-04-05 NOTE — Anesthesia Postprocedure Evaluation (Signed)
Anesthesia Post Note  Patient: Shawn Velazquez  Procedure(s) Performed: COLONOSCOPY WITH PROPOFOL POLYPECTOMY  Patient location during evaluation: Phase II Anesthesia Type: General Level of consciousness: awake and alert and oriented Pain management: pain level controlled Vital Signs Assessment: post-procedure vital signs reviewed and stable Respiratory status: spontaneous breathing, nonlabored ventilation and respiratory function stable Cardiovascular status: blood pressure returned to baseline and stable Postop Assessment: no apparent nausea or vomiting Anesthetic complications: no   No notable events documented.   Last Vitals:  Vitals:   04/05/22 1013 04/05/22 1104  BP: (!) 141/82 (!) 100/55  Pulse: 82 72  Resp: 18 18  Temp: 37.4 C 36.6 C  SpO2: 100% 100%    Last Pain:  Vitals:   04/05/22 1104  TempSrc: Oral  PainSc: 0-No pain                 [name] C [name]

## 2022-04-05 NOTE — H&P (Addendum)
Primary Care Physician:  Celene Squibb, MD Primary Gastroenterologist:  Dr. Abbey Chatters  Pre-Procedure History & Physical: HPI:  Shawn Velazquez is a 56 y.o. male is here for colonoscopy for colon cancer screening purposes.  Patient denies any family history of colorectal cancer.  No melena or hematochezia.  No abdominal pain or unintentional weight loss.  No change in bowel habits.  Overall feels well from a GI standpoint.  Past Medical History:  Diagnosis Date   Anxiety    Arthritis    affects hands, shoulder, neck, knees, hips, ankles, toes   Chronic back pain    Diabetes mellitus    diet controlled   Eczema    Fatty liver, alcoholic    GERD (gastroesophageal reflux disease)    Glaucoma    slight case   Hypertension    Psoriasis    Sleep apnea    Substance abuse ([place])    Vertigo 09/11/2017   Wears glasses     Past Surgical History:  Procedure Laterality Date   MULTIPLE TOOTH EXTRACTIONS     POSTERIOR CERVICAL LAMINECTOMY  04/21/2012   Procedure: POSTERIOR CERVICAL LAMINECTOMY;  Surgeon: Elaina Hoops, MD;  Location: [place] [facility];  Service: Neurosurgery;  Laterality: Left;  Posterior Cervical laminectomy/foraminotomy and diskectomy, left cervical seven-thoracic one   ROTATOR CUFF REPAIR     THORACIC DISCECTOMY Left 04/03/2014   Procedure: Left Thoracic Seven to Eight, Thoracic Eight to Nine Thoracic Discectomy ;  Surgeon: Elaina Hoops, MD;  Location: [place] [facility];  Service: Neurosurgery;  Laterality: Left;    Prior to Admission medications   Medication Sig Start Date End Date Taking? Authorizing Provider  amLODipine (NORVASC) 5 MG tablet Take 1 tablet (5 mg total) by mouth daily. 10/29/21  Yes Paseda, Dewaine Conger, FNP  esomeprazole (NEXIUM) 20 MG capsule Take 20 mg by mouth as needed.   Yes [provider]  FLUoxetine (PROZAC) 10 MG capsule TAKE 4 CAPSULES BY MOUTH EVERY DAY 02/17/22  Yes Paseda, Dewaine Conger, FNP  hydrochlorothiazide (HYDRODIURIL) 25 MG tablet Take 1 tablet  (25 mg total) by mouth daily. 08/28/21  Yes Paseda, Dewaine Conger, FNP  insulin glargine (LANTUS SOLOSTAR) 100 UNIT/ML Solostar Pen Inject 40 Units into the skin at bedtime. 02/04/22  Yes Reardon, Juanetta Beets, NP  insulin lispro (HUMALOG KWIKPEN) 100 UNIT/ML KwikPen Inject 8-14 Units into the skin 3 (three) times daily. 02/04/22  Yes Reardon, Juanetta Beets, NP  losartan (COZAAR) 50 MG tablet Take 1 tablet (50 mg total) by mouth daily. 08/28/21  Yes Paseda, Dewaine Conger, FNP  metFORMIN (GLUCOPHAGE-XR) 500 MG 24 hr tablet Take '1000mg'$  twice daily with meals 10/29/21  Yes Paseda, Dewaine Conger, FNP  Multiple Vitamin (MULTIVITAMIN) tablet Take 1 tablet by mouth daily.   Yes [provider]  albuterol (VENTOLIN HFA) 108 (90 Base) MCG/ACT inhaler INHALE 2 PUFFS INTO THE LUNGS EVERY 6 HOURS AS NEEDED FOR WHEEZING OR SHORTNESS OF BREATH 05/18/21   Lindell Spar, MD  Continuous Blood Gluc Sensor (DEXCOM G7 SENSOR) MISC Inject 1 application. into the skin as directed. Change sensor every 10 days as directed. 12/09/21   Brita Romp, NP  glucose blood test strip Use as instructed to monitor glucose 4 times daily 12/09/21   Brita Romp, NP  ibuprofen (ADVIL) 800 MG tablet Take 1 tablet (800 mg total) by mouth every 8 (eight) hours as needed for moderate pain. For pain 03/19/21   Noreene Larsson, NP  Insulin Pen Needle (  PEN NEEDLES) 32G X 4 MM MISC 1 each by Does not apply route in the morning, at noon, in the evening, and at bedtime. 10/15/20   Noreene Larsson, NP  meclizine (ANTIVERT) 25 MG tablet Take 25 mg by mouth 3 (three) times daily as needed for dizziness.    [provider]  montelukast (SINGULAIR) 10 MG tablet TAKE 1 TABLET(10 MG) BY MOUTH AT BEDTIME 12/10/21   Paseda, Folashade R, FNP  polyethylene glycol-electrolytes (NULYTELY) 420 g solution Take 4,000 mLs by mouth once. 03/04/22   [provider]    Allergies as of 03/04/2022 - Review Complete 03/04/2022  Allergen Reaction Noted    Neosporin + pain relief max st [neomy-bacit-polymyx-pramoxine] Rash 04/19/2012    Family History  Problem Relation Age of Onset   Diabetes Father    Cancer - Lung Father    Hypertension Other    Cancer - Lung Other     Social History   Socioeconomic History   Marital status: Divorced    Spouse name: Not on file   Number of children: Not on file   Years of education: Not on file   Highest education level: Not on file  Occupational History    Comment: Truck to storage  Tobacco Use   Smoking status: Every Day    Packs/day: 0.25    Years: 40.00    Total pack years: 10.00    Types: E-cigarettes, Cigarettes    Last attempt to quit: 05/11/2021    Years since quitting: 0.9   Smokeless tobacco: Never   Tobacco comments:    pt trying to quit on his own  Vaping Use   Vaping Use: Former  Substance and Sexual Activity   Alcohol use: Yes    Alcohol/week: 12.0 standard drinks of alcohol    Types: 12 Cans of beer per week    Comment: Either Smirnoff Smash-4 per day or Fireball-10 airplane bottles in a day   Drug use: No   Sexual activity: Yes    Birth control/protection: None  Other Topics Concern   Not on file  Social History Narrative   Not on file   Social Determinants of Health   Financial Resource Strain: Not on file  Food Insecurity: Not on file  Transportation Needs: Not on file  Physical Activity: Not on file  Stress: Not on file  Social Connections: Not on file  Intimate Partner Violence: Not on file    Review of Systems: See HPI, otherwise negative ROS  Physical Exam: Vital signs in last 24 hours: Temp:  [99.3 F (37.4 C)] 99.3 F (37.4 C) (10/02 1013) Pulse Rate:  [82] 82 (10/02 1013) Resp:  [18] 18 (10/02 1013) BP: (141)/(82) 141/82 (10/02 1013) SpO2:  [100 %] 100 % (10/02 1013) Weight:  [84.8 kg] 84.8 kg (10/02 1011)   General:   Alert,  Well-developed, well-nourished, pleasant and cooperative in NAD Head:  Normocephalic and atraumatic. Eyes:   Sclera clear, no icterus.   Conjunctiva pink. Ears:  Normal auditory acuity. Nose:  No deformity, discharge,  or lesions. Mouth:  No deformity or lesions, dentition normal. Neck:  Supple; no masses or thyromegaly. Lungs:  Clear throughout to auscultation.   No wheezes, crackles, or rhonchi. No acute distress. Heart:  Regular rate and rhythm; no murmurs, clicks, rubs,  or gallops. Abdomen:  Soft, nontender and nondistended. No masses, hepatosplenomegaly or hernias noted. Normal bowel sounds, without guarding, and without rebound.   Msk:  Symmetrical without gross deformities. Normal  posture. Extremities:  Without clubbing or edema. Neurologic:  Alert and  oriented x4;  grossly normal neurologically. Skin:  Intact without significant lesions or rashes. Cervical Nodes:  No significant cervical adenopathy. Psych:  Alert and cooperative. Normal mood and affect.  Impression/Plan: Shawn Velazquez is here for a colonoscopy to be performed for colon cancer screening purposes.  The risks of the procedure including infection, bleed, or perforation as well as benefits, limitations, alternatives and imponderables have been reviewed with the patient. Questions have been answered. All parties agreeable.

## 2022-04-05 NOTE — Transfer of Care (Signed)
Immediate Anesthesia Transfer of Care Note  Patient: Shawn Velazquez  Procedure(s) Performed: COLONOSCOPY WITH PROPOFOL POLYPECTOMY  Patient Location: Short Stay  Anesthesia Type:General  Level of Consciousness: drowsy  Airway & Oxygen Therapy: Patient Spontanous Breathing  Post-op Assessment: Report given to RN and Post -op Vital signs reviewed and stable  Post vital signs: Reviewed and stable  Last Vitals:  Vitals Value Taken Time  BP 100/55 04/05/22 1104  Temp 36.6 C 04/05/22 1104  Pulse 72 04/05/22 1104  Resp 18 04/05/22 1104  SpO2 100 % 04/05/22 1104    Last Pain:  Vitals:   04/05/22 1104  TempSrc: Oral  PainSc: 0-No pain      Patients Stated Pain Goal: 7 (93/57/01 7793)  Complications: No notable events documented.

## 2022-04-06 LAB — SURGICAL PATHOLOGY

## 2022-04-09 ENCOUNTER — Encounter (HOSPITAL_COMMUNITY): Payer: Self-pay | Admitting: Internal Medicine

## 2022-04-26 ENCOUNTER — Other Ambulatory Visit: Payer: Self-pay | Admitting: Nurse Practitioner

## 2022-04-29 ENCOUNTER — Other Ambulatory Visit: Payer: Self-pay | Admitting: Nurse Practitioner

## 2022-05-11 ENCOUNTER — Other Ambulatory Visit: Payer: Self-pay | Admitting: Nurse Practitioner

## 2022-05-11 ENCOUNTER — Ambulatory Visit: Payer: BC Managed Care – PPO | Admitting: Nurse Practitioner

## 2022-05-11 DIAGNOSIS — I10 Essential (primary) hypertension: Secondary | ICD-10-CM

## 2022-05-11 NOTE — Progress Notes (Unsigned)
GI Office Note    Referring Provider: Celene Squibb, MD Primary Care Physician:  Celene Squibb, MD  Primary Gastroenterologist:  Chief Complaint   No chief complaint on file.   History of Present Illness   Shawn Velazquez is a 56 y.o. male presenting today for follow-up.  Last seen in the office in August 2023.  At that time complained of chronic GERD, due for screening colonoscopy, chronically elevated LFTs.  CT abdomen pelvis in 2015 showed fatty liver.  Risk factors including obesity diabetes.  Significant alcohol use history.  For nearly 3 years she was drinking multiple liquor drinks daily after work, having only 1-2 drinks per week over the past 8 months.  No family history of liver disease.   Recent LFTs with alkaline phosphatase of 329, AST 88, ALT 64, total bilirubin 1.6, GGT 1014.  Acute hepatitis panel negative, alpha-1 antitrypsin level normal, ANA negative, anti-smooth muscle antibody negative, iron/TIBC/ferritin normal, AMA negative, hepatitis B surface antibody nonreactive, immunoglobulins unremarkable, TTG IgA less than 2,  Abdominal ultrasound with elastography September 2023: Mild gallbladder wall thickening, no gallstones.  Mild splenomegaly.  Fatty.  Median K PA of 6.4. Mild intermittent chronic GERD controlled with over-the-counter Nexium.   Medications   Current Outpatient Medications  Medication Sig Dispense Refill   albuterol (VENTOLIN HFA) 108 (90 Base) MCG/ACT inhaler INHALE 2 PUFFS INTO THE LUNGS EVERY 6 HOURS AS NEEDED FOR WHEEZING OR SHORTNESS OF BREATH 20.1 g 0   amLODipine (NORVASC) 5 MG tablet Take 1 tablet (5 mg total) by mouth daily. 90 tablet 1   Continuous Blood Gluc Sensor (DEXCOM G7 SENSOR) MISC Inject 1 application. into the skin as directed. Change sensor every 10 days as directed. 6 each 3   esomeprazole (NEXIUM) 20 MG capsule Take 20 mg by mouth as needed.     FLUoxetine (PROZAC) 10 MG capsule TAKE 4 CAPSULES BY MOUTH EVERY DAY 260  capsule 0   glucose blood test strip Use as instructed to monitor glucose 4 times daily 100 each 12   hydrochlorothiazide (HYDRODIURIL) 25 MG tablet Take 1 tablet (25 mg total) by mouth daily. 90 tablet 1   ibuprofen (ADVIL) 800 MG tablet Take 1 tablet (800 mg total) by mouth every 8 (eight) hours as needed for moderate pain. For pain 270 tablet 0   insulin glargine (LANTUS SOLOSTAR) 100 UNIT/ML Solostar Pen Inject 40 Units into the skin at bedtime. 30 mL 3   insulin lispro (HUMALOG KWIKPEN) 100 UNIT/ML KwikPen Inject 8-14 Units into the skin 3 (three) times daily. 40 mL 3   Insulin Pen Needle (PEN NEEDLES) 32G X 4 MM MISC 1 each by Does not apply route in the morning, at noon, in the evening, and at bedtime. 100 each 11   losartan (COZAAR) 50 MG tablet Take 1 tablet (50 mg total) by mouth daily. 90 tablet 1   meclizine (ANTIVERT) 25 MG tablet Take 25 mg by mouth 3 (three) times daily as needed for dizziness.     metFORMIN (GLUCOPHAGE-XR) 500 MG 24 hr tablet Take '1000mg'$  twice daily with meals 120 tablet 3   montelukast (SINGULAIR) 10 MG tablet TAKE 1 TABLET(10 MG) BY MOUTH AT BEDTIME 30 tablet 3   Multiple Vitamin (MULTIVITAMIN) tablet Take 1 tablet by mouth daily.     polyethylene glycol-electrolytes (NULYTELY) 420 g solution Take 4,000 mLs by mouth once.     No current facility-administered medications for this visit.    Allergies  Allergies as of 05/12/2022 - Review Complete 04/05/2022  Allergen Reaction Noted   Neosporin + pain relief max st [neomy-bacit-polymyx-pramoxine] Rash 04/19/2012     Past Medical History   Past Medical History:  Diagnosis Date   Anxiety    Arthritis    affects hands, shoulder, neck, knees, hips, ankles, toes   Chronic back pain    Diabetes mellitus    diet controlled   Eczema    Fatty liver, alcoholic    GERD (gastroesophageal reflux disease)    Glaucoma    slight case   Hypertension    Psoriasis    Sleep apnea    Substance abuse ([place])     Vertigo 09/11/2017   Wears glasses     Past Surgical History   Past Surgical History:  Procedure Laterality Date   COLONOSCOPY WITH PROPOFOL N/A 04/05/2022   Procedure: COLONOSCOPY WITH PROPOFOL;  Surgeon: [name], DO;  Location: [facility];  Service: Endoscopy;  Laterality: N/A;  10:45 AM,unable to reach pt to move up   [place]     POLYPECTOMY  04/05/2022   Procedure: POLYPECTOMY;  Surgeon: [name], DO;  Location: [facility];  Service: Endoscopy;;   POSTERIOR CERVICAL LAMINECTOMY  04/21/2012   Procedure: POSTERIOR CERVICAL LAMINECTOMY;  Surgeon: Elaina Hoops, MD;  Location: [place] [facility];  Service: Neurosurgery;  Laterality: Left;  Posterior Cervical laminectomy/foraminotomy and diskectomy, left cervical seven-thoracic one   ROTATOR CUFF REPAIR     THORACIC DISCECTOMY Left 04/03/2014   Procedure: Left Thoracic Seven to Eight, Thoracic Eight to Nine Thoracic Discectomy ;  Surgeon: Elaina Hoops, MD;  Location: [place] [facility];  Service: Neurosurgery;  Laterality: Left;    Past Family History   Family History  Problem Relation Age of Onset   Diabetes Father    Cancer - Lung Father    Hypertension Other    Cancer - Lung Other     Past Social History   Social History   Socioeconomic History   Marital status: Divorced    Spouse name: Not on file   Number of children: Not on file   Years of education: Not on file   Highest education level: Not on file  Occupational History    Comment: Truck to storage  Tobacco Use   Smoking status: Every Day    Packs/day: 0.25    Years: 40.00    Total pack years: 10.00    Types: E-cigarettes, Cigarettes    Last attempt to quit: 05/11/2021    Years since quitting: 1.0   Smokeless tobacco: Never   Tobacco comments:    pt trying to quit on his own  Vaping Use   Vaping Use: Former  Substance and Sexual Activity   Alcohol use: Yes    Alcohol/week: 12.0 standard drinks of alcohol    Types: 12 Cans of  beer per week    Comment: Either Smirnoff Smash-4 per day or Fireball-10 airplane bottles in a day   Drug use: No   Sexual activity: Yes    Birth control/protection: None  Other Topics Concern   Not on file  Social History Narrative   Not on file   Social Determinants of Health   Financial Resource Strain: Not on file  Food Insecurity: Not on file  Transportation Needs: Not on file  Physical Activity: Not on file  Stress: Not on file  Social Connections: Not on file  Intimate Partner Violence: Not on file  Review of Systems   General: Negative for anorexia, weight loss, fever, chills, fatigue, weakness. ENT: Negative for hoarseness, difficulty swallowing , nasal congestion. CV: Negative for chest pain, angina, palpitations, dyspnea on exertion, peripheral edema.  Respiratory: Negative for dyspnea at rest, dyspnea on exertion, cough, sputum, wheezing.  GI: See history of present illness. GU:  Negative for dysuria, hematuria, urinary incontinence, urinary frequency, nocturnal urination.  Endo: Negative for unusual weight change.     Physical Exam   There were no vitals taken for this visit.   General: Well-nourished, well-developed in no acute distress.  Eyes: No icterus. Mouth: Oropharyngeal mucosa moist and pink , no lesions erythema or exudate. Lungs: Clear to auscultation bilaterally.  Heart: Regular rate and rhythm, no murmurs rubs or gallops.  Abdomen: Bowel sounds are normal, nontender, nondistended, no hepatosplenomegaly or masses,  no abdominal bruits or hernia , no rebound or guarding.  Rectal: ***  Extremities: No lower extremity edema. No clubbing or deformities. Neuro: Alert and oriented x 4   Skin: Warm and dry, no jaundice.   Psych: Alert and cooperative, normal mood and affect.  Labs   *** Imaging Studies   No results found.  Assessment       PLAN   ***   Laureen Ochs. Bobby Rumpf, [place], [facility]

## 2022-05-12 ENCOUNTER — Encounter: Payer: Self-pay | Admitting: Nurse Practitioner

## 2022-05-12 ENCOUNTER — Ambulatory Visit: Payer: BC Managed Care – PPO | Admitting: Nurse Practitioner

## 2022-05-12 ENCOUNTER — Ambulatory Visit: Payer: BC Managed Care – PPO | Admitting: Gastroenterology

## 2022-05-12 ENCOUNTER — Encounter: Payer: Self-pay | Admitting: Gastroenterology

## 2022-05-12 VITALS — BP 115/73 | HR 73 | Ht 66.0 in | Wt 190.6 lb

## 2022-05-12 VITALS — BP 118/78 | HR 75 | Temp 98.3°F | Ht 66.0 in | Wt 193.4 lb

## 2022-05-12 DIAGNOSIS — E782 Mixed hyperlipidemia: Secondary | ICD-10-CM | POA: Diagnosis not present

## 2022-05-12 DIAGNOSIS — R7989 Other specified abnormal findings of blood chemistry: Secondary | ICD-10-CM | POA: Diagnosis not present

## 2022-05-12 DIAGNOSIS — E1165 Type 2 diabetes mellitus with hyperglycemia: Secondary | ICD-10-CM | POA: Diagnosis not present

## 2022-05-12 DIAGNOSIS — I10 Essential (primary) hypertension: Secondary | ICD-10-CM

## 2022-05-12 DIAGNOSIS — R1030 Lower abdominal pain, unspecified: Secondary | ICD-10-CM | POA: Insufficient documentation

## 2022-05-12 LAB — POCT GLYCOSYLATED HEMOGLOBIN (HGB A1C): Hemoglobin A1C: 6.4 % — AB (ref 4.0–5.6)

## 2022-05-12 MED ORDER — INSULIN LISPRO (1 UNIT DIAL) 100 UNIT/ML (KWIKPEN): 8.0000 [IU] | PEN_INJECTOR | Freq: Three times a day (TID) | SUBCUTANEOUS | 3 refills | Status: DC

## 2022-05-12 MED ORDER — METFORMIN HCL ER 500 MG PO TB24: 500.0000 mg | ORAL_TABLET | Freq: Two times a day (BID) | ORAL | 3 refills | Status: DC

## 2022-05-12 MED ORDER — PEN NEEDLES 32G X 4 MM MISC: 1.0000 | Freq: Four times a day (QID) | 3 refills | Status: DC

## 2022-05-12 MED ORDER — LANTUS SOLOSTAR 100 UNIT/ML ~~LOC~~ SOPN: 35.0000 [IU] | PEN_INJECTOR | Freq: Every day | SUBCUTANEOUS | 3 refills | Status: DC

## 2022-05-12 NOTE — Patient Instructions (Signed)
Labs as discussed. Would consider CT scan if labs suggest underlying inflammation or worsening abdominal pain. Further recommendations to follow.

## 2022-05-12 NOTE — Progress Notes (Signed)
Endocrinology Follow Up Note       05/12/2022, 11:29 AM   Subjective:    Patient ID: Shawn Velazquez, male    DOB: 25-Apr-1966.  Shawn Velazquez is being seen in follow up after being seen in consultation for management of currently uncontrolled symptomatic diabetes requested by  Celene Squibb, MD.   Past Medical History:  Diagnosis Date   Anxiety    Arthritis    affects hands, shoulder, neck, knees, hips, ankles, toes   Chronic back pain    Diabetes mellitus    diet controlled   Eczema    Fatty liver, alcoholic    GERD (gastroesophageal reflux disease)    Glaucoma    slight case   Hypertension    Psoriasis    Sleep apnea    Substance abuse ([place])    Vertigo 09/11/2017   Wears glasses     Past Surgical History:  Procedure Laterality Date   COLONOSCOPY WITH PROPOFOL N/A 04/05/2022   Procedure: COLONOSCOPY WITH PROPOFOL;  Surgeon: [name], DO;  Location: [facility];  Service: Endoscopy;  Laterality: N/A;  10:45 AM,unable to reach pt to move up   [place]     POLYPECTOMY  04/05/2022   Procedure: POLYPECTOMY;  Surgeon: [name], DO;  Location: [facility];  Service: Endoscopy;;   POSTERIOR CERVICAL LAMINECTOMY  04/21/2012   Procedure: POSTERIOR CERVICAL LAMINECTOMY;  Surgeon: Elaina Hoops, MD;  Location: [facility];  Service: Neurosurgery;  Laterality: Left;  Posterior Cervical laminectomy/foraminotomy and diskectomy, left cervical seven-thoracic one   ROTATOR CUFF REPAIR     THORACIC DISCECTOMY Left 04/03/2014   Procedure: Left Thoracic Seven to Eight, Thoracic Eight to Nine Thoracic Discectomy ;  Surgeon: Elaina Hoops, MD;  Location: [facility];  Service: Neurosurgery;  Laterality: Left;    Social History   Socioeconomic History   Marital status: Divorced    Spouse name: Not on file   Number of children: Not on file   Years of education: Not on file    Highest education level: Not on file  Occupational History    Comment: Truck to storage  Tobacco Use   Smoking status: Former    Packs/day: 0.25    Years: 40.00    Total pack years: 10.00    Types: E-cigarettes, Cigarettes    Quit date: 05/09/2022   Smokeless tobacco: Never   Tobacco comments:    pt trying to quit on his own  Vaping Use   Vaping Use: Former  Substance and Sexual Activity   Alcohol use: Yes    Alcohol/week: 12.0 standard drinks of alcohol    Types: 12 Cans of beer per week    Comment: Either Smirnoff Smash-4 per day or Fireball-10 airplane bottles in a day   Drug use: No   Sexual activity: Yes    Birth control/protection: None  Other Topics Concern   Not on file  Social History Narrative   Not on file   Social Determinants of Health   Financial Resource Strain: Not on file  Food Insecurity: Not on file  Transportation Needs: Not on file  Physical Activity: Not  on file  Stress: Not on file  Social Connections: Not on file    Family History  Problem Relation Age of Onset   Diabetes Father    Cancer - Lung Father    Hypertension Other    Cancer - Lung Other     Outpatient Encounter Medications as of 05/12/2022  Medication Sig   acetaminophen (TYLENOL) 325 MG tablet Take 650 mg by mouth as needed for mild pain, headache or moderate pain.   albuterol (VENTOLIN HFA) 108 (90 Base) MCG/ACT inhaler INHALE 2 PUFFS INTO THE LUNGS EVERY 6 HOURS AS NEEDED FOR WHEEZING OR SHORTNESS OF BREATH   amLODipine (NORVASC) 5 MG tablet Take 1 tablet (5 mg total) by mouth daily.   Continuous Blood Gluc Sensor (DEXCOM G7 SENSOR) MISC Inject 1 application. into the skin as directed. Change sensor every 10 days as directed.   FLUoxetine (PROZAC) 10 MG capsule TAKE 4 CAPSULES BY MOUTH EVERY DAY   glucose blood test strip Use as instructed to monitor glucose 4 times daily   hydrochlorothiazide (HYDRODIURIL) 25 MG tablet Take 1 tablet (25 mg total) by mouth daily.   losartan  (COZAAR) 50 MG tablet Take 1 tablet (50 mg total) by mouth daily.   montelukast (SINGULAIR) 10 MG tablet TAKE 1 TABLET(10 MG) BY MOUTH AT BEDTIME   Multiple Vitamin (MULTIVITAMIN) tablet Take 1 tablet by mouth daily.   omeprazole (PRILOSEC OTC) 20 MG tablet Take 20 mg by mouth every other day.   [DISCONTINUED] insulin glargine (LANTUS SOLOSTAR) 100 UNIT/ML Solostar Pen Inject 40 Units into the skin at bedtime.   [DISCONTINUED] insulin lispro (HUMALOG KWIKPEN) 100 UNIT/ML KwikPen Inject 8-14 Units into the skin 3 (three) times daily.   [DISCONTINUED] Insulin Pen Needle (PEN NEEDLES) 32G X 4 MM MISC 1 each by Does not apply route in the morning, at noon, in the evening, and at bedtime.   [DISCONTINUED] metFORMIN (GLUCOPHAGE-XR) 500 MG 24 hr tablet Take '1000mg'$  twice daily with meals (Patient taking differently: 500 mg 2 (two) times daily with a meal. Take 500 mg twice daily with meals)   insulin glargine (LANTUS SOLOSTAR) 100 UNIT/ML Solostar Pen Inject 35 Units into the skin at bedtime.   insulin lispro (HUMALOG KWIKPEN) 100 UNIT/ML KwikPen Inject 8-14 Units into the skin 3 (three) times daily.   Insulin Pen Needle (PEN NEEDLES) 32G X 4 MM MISC 1 each by Does not apply route in the morning, at noon, in the evening, and at bedtime. Use to inject insulin 4 times daily   metFORMIN (GLUCOPHAGE-XR) 500 MG 24 hr tablet Take 1 tablet (500 mg total) by mouth 2 (two) times daily with a meal. Take '1000mg'$  twice daily with meals   [DISCONTINUED] esomeprazole (NEXIUM) 20 MG capsule Take 20 mg by mouth as needed. (Patient not taking: Reported on 05/12/2022)   [DISCONTINUED] ibuprofen (ADVIL) 800 MG tablet Take 1 tablet (800 mg total) by mouth every 8 (eight) hours as needed for moderate pain. For pain (Patient not taking: Reported on 05/12/2022)   [DISCONTINUED] meclizine (ANTIVERT) 25 MG tablet Take 25 mg by mouth 3 (three) times daily as needed for dizziness. (Patient not taking: Reported on 05/12/2022)    [DISCONTINUED] polyethylene glycol-electrolytes (NULYTELY) 420 g solution Take 4,000 mLs by mouth once. (Patient not taking: Reported on 05/12/2022)   No facility-administered encounter medications on file as of 05/12/2022.    ALLERGIES: Allergies  Allergen Reactions   Neosporin + Pain Relief Max St [Neomy-Bacit-Polymyx-Pramoxine] Rash    VACCINATION STATUS:  Immunization History  Administered Date(s) Administered   Influenza,inj,Quad PF,6+ Mos 07/31/2021   Moderna Sars-Covid-2 Vaccination 01/09/2020, 02/06/2020   Tdap 08/06/2021   Zoster Recombinat (Shingrix) 07/31/2021, 10/29/2021    Diabetes He presents for his follow-up diabetic visit. He has type 2 diabetes mellitus. Onset time: was diagnosed at approx age of 22. His disease course has been improving. Hypoglycemia symptoms include sweats and tremors. Associated symptoms include blurred vision, fatigue, polydipsia and polyuria. Pertinent negatives for diabetes include no weight loss. Hypoglycemia complications include nocturnal hypoglycemia. Symptoms are improving. Diabetic complications include nephropathy. Risk factors for coronary artery disease include diabetes mellitus, dyslipidemia, hypertension, male sex, obesity and sedentary lifestyle. Current diabetic treatment includes oral agent (monotherapy) and intensive insulin program. He is compliant with treatment most of the time. His weight is fluctuating minimally. He is following a generally unhealthy diet. When asked about meal planning, he reported none. He has not had a previous visit with a dietitian. He rarely participates in exercise. His home blood glucose trend is decreasing steadily. His overall blood glucose range is >200 mg/dl. (He presents today with his CGM and logs showing slightly above target glycemic profile but improving in recent weeks.  His POCT A1c today is 6.4%, improving from last visit of 10.1%.  Analysis of his CGM shows TIR 47%, TAR 53%, TBR <2% with a GMI of  8.3%.  Last week he celebrated his birthday and glucose was elevated because he drank alcohol.  He hasn't drank since then and glucose is MUCH better.) An ACE inhibitor/angiotensin II receptor blocker is being taken. He does not see a podiatrist.Eye exam is current.  Hyperlipidemia This is a chronic problem. The current episode started more than 1 year ago. The problem is controlled. Recent lipid tests were reviewed and are normal. Exacerbating diseases include chronic renal disease, diabetes and obesity. Factors aggravating his hyperlipidemia include thiazides and fatty foods. Current antihyperlipidemic treatment includes statins. The current treatment provides mild improvement of lipids. Compliance problems include adherence to diet, adherence to exercise and psychosocial issues.  Risk factors for coronary artery disease include diabetes mellitus, dyslipidemia, family history, hypertension, male sex, obesity and a sedentary lifestyle.  Hypertension This is a chronic problem. The current episode started more than 1 year ago. The problem is unchanged. The problem is uncontrolled. Associated symptoms include blurred vision and sweats. There are no associated agents to hypertension. Risk factors for coronary artery disease include diabetes mellitus, dyslipidemia, family history, male gender, obesity and sedentary lifestyle. Past treatments include diuretics and angiotensin blockers. The current treatment provides mild improvement. Compliance problems include diet, exercise and psychosocial issues.  Hypertensive end-organ damage includes kidney disease. Identifiable causes of hypertension include chronic renal disease.     Review of systems  Constitutional: + minimally fluctuating body weight,  current Body mass index is 30.76 kg/m. , + fatigue, no subjective hyperthermia, no subjective hypothermia Eyes: + blurry vision no xerophthalmia ENT: no sore throat, no nodules palpated in throat, no  dysphagia/odynophagia, no hoarseness Cardiovascular: no chest pain, no shortness of breath, no palpitations, no leg swelling Respiratory: no cough, no shortness of breath Gastrointestinal: no nausea/vomiting/diarrhea Musculoskeletal: no muscle/joint aches Skin: no rashes, no hyperemia Neurological: no tremors, no numbness, no tingling, no dizziness Psychiatric: no depression, no anxiety  Objective:     BP 115/73 (BP Location: Left Arm, Patient Position: Sitting, Cuff Size: Large)   Pulse 73   Ht '5\' 6"'$  (1.676 m)   Wt 190 lb 9.6 oz (86.5 kg)  BMI 30.76 kg/m   Wt Readings from Last 3 Encounters:  05/12/22 190 lb 9.6 oz (86.5 kg)  05/12/22 193 lb 6.4 oz (87.7 kg)  04/05/22 187 lb (84.8 kg)     BP Readings from Last 3 Encounters:  05/12/22 115/73  05/12/22 118/78  04/05/22 (!) 100/55      Physical Exam- Limited  Constitutional:  Body mass index is 30.76 kg/m. , not in acute distress, normal state of mind Eyes:  EOMI, no exophthalmos Neck: Supple Cardiovascular: RRR, no murmurs, rubs, or gallops, no edema Respiratory: Adequate breathing efforts, no crackles, rales, rhonchi, or wheezing Musculoskeletal: no gross deformities, strength intact in all four extremities, no gross restriction of joint movements Skin:  no rashes, no hyperemia Neurological: no tremor with outstretched hands    CMP ( most recent) CMP     Component Value Date/Time   NA 139 03/09/2022 0809   K 3.2 (L) 03/09/2022 0809   CL 100 03/09/2022 0809   CO2 22 03/09/2022 0809   GLUCOSE 171 (H) 03/09/2022 0809   GLUCOSE 127 (H) 09/11/2017 2143   BUN 5 (L) 03/09/2022 0809   CREATININE 1.00 03/09/2022 0809   CALCIUM 9.6 03/09/2022 0809   PROT 7.3 03/09/2022 0809   ALBUMIN 4.0 03/09/2022 0809   AST 111 (H) 03/09/2022 0809   ALT 58 (H) 03/09/2022 0809   ALKPHOS 188 (H) 03/09/2022 0809   BILITOT 1.3 (H) 03/09/2022 0809   GFRNONAA 72 07/10/2020 1005   GFRAA 84 07/10/2020 1005     Diabetic Labs (most  recent): Lab Results  Component Value Date   HGBA1C 6.4 (A) 05/12/2022   HGBA1C 10.1 02/04/2022   HGBA1C 11.3 (A) 10/29/2021     Lipid Panel ( most recent) Lipid Panel     Component Value Date/Time   CHOL 109 11/24/2021 0817   TRIG 95 11/24/2021 0817   HDL 34 (L) 11/24/2021 0817   CHOLHDL 3.2 11/24/2021 0817   LDLCALC 57 11/24/2021 0817   LABVLDL 18 11/24/2021 0817      No results found for: "TSH", "FREET4"         Assessment & Plan:   1) Controlled type 2 diabetes mellitus without complications ([place])  He presents today with his CGM and logs showing slightly above target glycemic profile but improving in recent weeks.  His POCT A1c today is 6.4%, improving from last visit of 10.1%.  Analysis of his CGM shows TIR 47%, TAR 53%, TBR <2% with a GMI of 8.3%.  Last week he celebrated his birthday and glucose was elevated because he drank alcohol.  He hasn't drank since then and glucose is MUCH better.  - Shawn Velazquez has currently uncontrolled symptomatic type 2 DM since 56 years of age.   -Recent labs reviewed.  - I had a long discussion with him about the progressive nature of diabetes and the pathology behind its complications. -his diabetes is complicated by chronic alcohol abuse, mild CKD and he remains at a high risk for more acute and chronic complications which include CAD, CVA, CKD, retinopathy, and neuropathy. These are all discussed in detail with him.  The following Lifestyle Medicine recommendations according to [facility]) were discussed and offered to patient and he agrees to start the journey:  A. Whole Foods, Plant-based plate comprising of fruits and vegetables, plant-based proteins, whole-grain carbohydrates was discussed in detail with the patient.   A list for source of those nutrients were also provided to the patient.  Patient will use only water or unsweetened tea for hydration. B.  The need to stay away from risky  substances including alcohol, smoking; obtaining 7 to 9 hours of restorative sleep, at least 150 minutes of moderate intensity exercise weekly, the importance of healthy social connections,  and stress reduction techniques were discussed. C.  A full color page of  Calorie density of various food groups per pound showing examples of each food groups was provided to the patient.  - Nutritional counseling repeated at each appointment due to patients tendency to fall back in to old habits.  - The patient admits there is a room for improvement in their diet and drink choices. -  Suggestion is made for the patient to avoid simple carbohydrates from their diet including Cakes, Sweet Desserts / Pastries, Ice Cream, Soda (diet and regular), Sweet Tea, Candies, Chips, Cookies, Sweet Pastries, Store Bought Juices, Alcohol in Excess of 1-2 drinks a day, Artificial Sweeteners, Coffee Creamer, and "Sugar-free" Products. This will help patient to have stable blood glucose profile and potentially avoid unintended weight gain.   - I encouraged the patient to switch to unprocessed or minimally processed complex starch and increased protein intake (animal or plant source), fruits, and vegetables.   - Patient is advised to stick to a routine mealtimes to eat 3 meals a day and avoid unnecessary snacks (to snack only to correct hypoglycemia).  - I have approached him with the following individualized plan to manage  his diabetes and patient agrees:   -He is advised to lower his Lantus to 35 units SQ nightly and continue his Humalog 8-14 units TID with meals if glucose is above 90 and he is eating (Specific instructions on how to titrate insulin dosage based on glucose readings given to patient in writing).  He can continue his Metformin 500 mg ER twice daily.   -he is encouraged to continue monitoring blood glucose 4 times daily (using his CGM), before meals and before bed, and to call the clinic if he has readings less  than 70 or greater than 300 for 3 tests in a row.     - he is warned not to take insulin without proper monitoring per orders. - Adjustment parameters are given to him for hypo and hyperglycemia in writing.  - Specific targets for  A1c;  LDL, HDL,  and Triglycerides were discussed with the patient.  2) Blood Pressure /Hypertension:  his blood pressure is controlled to target.   he is advised to continue his current medications including Norvasc 5 mg po daily, HCTZ 25 mg po daily and Losartan 50 mg po daily.  3) Lipids/Hyperlipidemia:    Review of his recent lipid panel from 11/24/21 showed controlled  LDL at 57.  He is not currently on lipid lowering medications likely due to elevated LFTs.  4)  Weight/Diet:  his Body mass index is 30.76 kg/m.  -  clearly complicating his diabetes care.   he is a candidate for weight loss. I discussed with him the fact that loss of 5 - 10% of his  current body weight will have the most impact on his diabetes management.  Exercise, and detailed carbohydrates information provided  -  detailed on discharge instructions.  5) Chronic Care/Health Maintenance: -he is on ACEI/ARB and Statin medications and is encouraged to initiate and continue to follow up with Ophthalmology, Dentist, Podiatrist at least yearly or according to recommendations, and advised to stay away from smoking. I have recommended yearly flu  vaccine and pneumonia vaccine at least every 5 years; moderate intensity exercise for up to 150 minutes weekly; and sleep for at least 7 hours a day.  - he is advised to maintain close follow up with Celene Squibb, MD for primary care needs, as well as his other providers for optimal and coordinated care.      I spent 31 minutes in the care of the patient today including review of labs from [facility], Lipids, Thyroid Function, Hematology (current and previous including abstractions from other facilities); face-to-face time discussing  his blood glucose  readings/logs, discussing hypoglycemia and hyperglycemia episodes and symptoms, medications doses, his options of short and long term treatment based on the latest standards of care / guidelines;  discussion about incorporating lifestyle medicine;  and documenting the encounter. Risk reduction counseling performed per USPSTF guidelines to reduce obesity and cardiovascular risk factors.     Please refer to Patient Instructions for Blood Glucose Monitoring and Insulin/Medications Dosing Guide"  in media tab for additional information. Please  also refer to " Patient Self Inventory" in the Media  tab for reviewed elements of pertinent patient history.  Shirlean Mylar participated in the discussions, expressed understanding, and voiced agreement with the above plans.  All questions were answered to his satisfaction. he is encouraged to contact clinic should he have any questions or concerns prior to his return visit.   Follow up plan: - Return in about 4 months (around 09/10/2022) for Diabetes F/U with A1c in office, Bring meter and logs, No previsit labs.  Rayetta Pigg, [facility] [address] [place], [place] [address] Phone: [phone] Fax: [phone]  05/12/2022, 11:29 AM

## 2022-05-13 ENCOUNTER — Other Ambulatory Visit: Payer: Self-pay

## 2022-05-13 MED ORDER — METFORMIN HCL ER 500 MG PO TB24: 500.0000 mg | ORAL_TABLET | Freq: Two times a day (BID) | ORAL | 0 refills | Status: DC

## 2022-06-10 DIAGNOSIS — R112 Nausea with vomiting, unspecified: Secondary | ICD-10-CM | POA: Diagnosis not present

## 2022-06-10 DIAGNOSIS — R42 Dizziness and giddiness: Secondary | ICD-10-CM | POA: Diagnosis not present

## 2022-07-22 DIAGNOSIS — M7989 Other specified soft tissue disorders: Secondary | ICD-10-CM | POA: Diagnosis not present

## 2022-07-22 DIAGNOSIS — G4700 Insomnia, unspecified: Secondary | ICD-10-CM | POA: Diagnosis not present

## 2022-07-22 DIAGNOSIS — F419 Anxiety disorder, unspecified: Secondary | ICD-10-CM | POA: Diagnosis not present

## 2022-07-22 DIAGNOSIS — R110 Nausea: Secondary | ICD-10-CM | POA: Diagnosis not present

## 2022-07-22 DIAGNOSIS — I10 Essential (primary) hypertension: Secondary | ICD-10-CM | POA: Diagnosis not present

## 2022-08-02 ENCOUNTER — Emergency Department (HOSPITAL_COMMUNITY): Payer: BC Managed Care – PPO

## 2022-08-02 ENCOUNTER — Inpatient Hospital Stay (HOSPITAL_COMMUNITY)
Admission: EM | Admit: 2022-08-02 | Discharge: 2022-08-05 | DRG: 433 | Disposition: A | Discharge status: Home / Self Care | Payer: BC Managed Care – PPO | Attending: Family Medicine | Admitting: Family Medicine

## 2022-08-02 ENCOUNTER — Other Ambulatory Visit: Payer: Self-pay

## 2022-08-02 ENCOUNTER — Inpatient Hospital Stay (HOSPITAL_COMMUNITY): Payer: BC Managed Care – PPO

## 2022-08-02 ENCOUNTER — Encounter (HOSPITAL_COMMUNITY): Payer: Self-pay | Admitting: *Deleted

## 2022-08-02 DIAGNOSIS — I864 Gastric varices: Secondary | ICD-10-CM | POA: Diagnosis present

## 2022-08-02 DIAGNOSIS — Z79899 Other long term (current) drug therapy: Secondary | ICD-10-CM | POA: Diagnosis not present

## 2022-08-02 DIAGNOSIS — F1010 Alcohol abuse, uncomplicated: Secondary | ICD-10-CM | POA: Diagnosis present

## 2022-08-02 DIAGNOSIS — H409 Unspecified glaucoma: Secondary | ICD-10-CM | POA: Diagnosis present

## 2022-08-02 DIAGNOSIS — K8020 Calculus of gallbladder without cholecystitis without obstruction: Secondary | ICD-10-CM | POA: Diagnosis not present

## 2022-08-02 DIAGNOSIS — M7989 Other specified soft tissue disorders: Secondary | ICD-10-CM | POA: Diagnosis present

## 2022-08-02 DIAGNOSIS — I8510 Secondary esophageal varices without bleeding: Secondary | ICD-10-CM | POA: Diagnosis not present

## 2022-08-02 DIAGNOSIS — F419 Anxiety disorder, unspecified: Secondary | ICD-10-CM | POA: Diagnosis not present

## 2022-08-02 DIAGNOSIS — L409 Psoriasis, unspecified: Secondary | ICD-10-CM | POA: Diagnosis present

## 2022-08-02 DIAGNOSIS — R739 Hyperglycemia, unspecified: Secondary | ICD-10-CM | POA: Diagnosis not present

## 2022-08-02 DIAGNOSIS — R601 Generalized edema: Secondary | ICD-10-CM | POA: Diagnosis present

## 2022-08-02 DIAGNOSIS — F32A Depression, unspecified: Secondary | ICD-10-CM | POA: Diagnosis not present

## 2022-08-02 DIAGNOSIS — Z794 Long term (current) use of insulin: Secondary | ICD-10-CM | POA: Diagnosis not present

## 2022-08-02 DIAGNOSIS — Z833 Family history of diabetes mellitus: Secondary | ICD-10-CM

## 2022-08-02 DIAGNOSIS — T380X5A Adverse effect of glucocorticoids and synthetic analogues, initial encounter: Secondary | ICD-10-CM | POA: Diagnosis not present

## 2022-08-02 DIAGNOSIS — K7460 Unspecified cirrhosis of liver: Secondary | ICD-10-CM | POA: Diagnosis not present

## 2022-08-02 DIAGNOSIS — E785 Hyperlipidemia, unspecified: Secondary | ICD-10-CM | POA: Diagnosis not present

## 2022-08-02 DIAGNOSIS — E8770 Fluid overload, unspecified: Secondary | ICD-10-CM | POA: Diagnosis present

## 2022-08-02 DIAGNOSIS — Z9109 Other allergy status, other than to drugs and biological substances: Secondary | ICD-10-CM

## 2022-08-02 DIAGNOSIS — R7989 Other specified abnormal findings of blood chemistry: Secondary | ICD-10-CM | POA: Diagnosis present

## 2022-08-02 DIAGNOSIS — K4090 Unilateral inguinal hernia, without obstruction or gangrene, not specified as recurrent: Secondary | ICD-10-CM | POA: Diagnosis not present

## 2022-08-02 DIAGNOSIS — K7011 Alcoholic hepatitis with ascites: Secondary | ICD-10-CM | POA: Diagnosis not present

## 2022-08-02 DIAGNOSIS — R188 Other ascites: Secondary | ICD-10-CM | POA: Diagnosis not present

## 2022-08-02 DIAGNOSIS — K648 Other hemorrhoids: Secondary | ICD-10-CM | POA: Diagnosis present

## 2022-08-02 DIAGNOSIS — I8500 Esophageal varices without bleeding: Secondary | ICD-10-CM | POA: Diagnosis not present

## 2022-08-02 DIAGNOSIS — E669 Obesity, unspecified: Secondary | ICD-10-CM | POA: Diagnosis present

## 2022-08-02 DIAGNOSIS — I10 Essential (primary) hypertension: Secondary | ICD-10-CM | POA: Diagnosis not present

## 2022-08-02 DIAGNOSIS — Z801 Family history of malignant neoplasm of trachea, bronchus and lung: Secondary | ICD-10-CM | POA: Diagnosis not present

## 2022-08-02 DIAGNOSIS — R0602 Shortness of breath: Secondary | ICD-10-CM | POA: Diagnosis not present

## 2022-08-02 DIAGNOSIS — R0609 Other forms of dyspnea: Secondary | ICD-10-CM | POA: Diagnosis not present

## 2022-08-02 DIAGNOSIS — Z86010 Personal history of colonic polyps: Secondary | ICD-10-CM

## 2022-08-02 DIAGNOSIS — K769 Liver disease, unspecified: Secondary | ICD-10-CM | POA: Diagnosis not present

## 2022-08-02 DIAGNOSIS — E876 Hypokalemia: Secondary | ICD-10-CM | POA: Diagnosis present

## 2022-08-02 DIAGNOSIS — K760 Fatty (change of) liver, not elsewhere classified: Secondary | ICD-10-CM | POA: Diagnosis present

## 2022-08-02 DIAGNOSIS — Z8249 Family history of ischemic heart disease and other diseases of the circulatory system: Secondary | ICD-10-CM | POA: Diagnosis not present

## 2022-08-02 DIAGNOSIS — Z6834 Body mass index (BMI) 34.0-34.9, adult: Secondary | ICD-10-CM | POA: Diagnosis not present

## 2022-08-02 DIAGNOSIS — E1165 Type 2 diabetes mellitus with hyperglycemia: Secondary | ICD-10-CM | POA: Diagnosis not present

## 2022-08-02 DIAGNOSIS — F17210 Nicotine dependence, cigarettes, uncomplicated: Secondary | ICD-10-CM | POA: Diagnosis not present

## 2022-08-02 DIAGNOSIS — K7031 Alcoholic cirrhosis of liver with ascites: Secondary | ICD-10-CM | POA: Diagnosis not present

## 2022-08-02 DIAGNOSIS — E871 Hypo-osmolality and hyponatremia: Secondary | ICD-10-CM | POA: Diagnosis not present

## 2022-08-02 DIAGNOSIS — Z7984 Long term (current) use of oral hypoglycemic drugs: Secondary | ICD-10-CM

## 2022-08-02 DIAGNOSIS — K219 Gastro-esophageal reflux disease without esophagitis: Secondary | ICD-10-CM | POA: Diagnosis present

## 2022-08-02 LAB — BODY FLUID CELL COUNT WITH DIFFERENTIAL
Eos, Fluid: 0 %
Lymphs, Fluid: 69 %
Monocyte-Macrophage-Serous Fluid: 19 % — ABNORMAL LOW (ref 50–90)
Neutrophil Count, Fluid: 12 % (ref 0–25)
Total Nucleated Cell Count, Fluid: 174 cu mm (ref 0–1000)

## 2022-08-02 LAB — COMPREHENSIVE METABOLIC PANEL
ALT: 77 U/L — ABNORMAL HIGH (ref 0–44)
AST: 248 U/L — ABNORMAL HIGH (ref 15–41)
Albumin: 2.6 g/dL — ABNORMAL LOW (ref 3.5–5.0)
Alkaline Phosphatase: 262 U/L — ABNORMAL HIGH (ref 38–126)
Anion gap: 10 (ref 5–15)
BUN: 5 mg/dL — ABNORMAL LOW (ref 6–20)
CO2: 28 mmol/L (ref 22–32)
Calcium: 8.4 mg/dL — ABNORMAL LOW (ref 8.9–10.3)
Chloride: 96 mmol/L — ABNORMAL LOW (ref 98–111)
Creatinine, Ser: 0.87 mg/dL (ref 0.61–1.24)
GFR, Estimated: >60 mL/min (ref >60)
Glucose, Bld: 155 mg/dL — ABNORMAL HIGH (ref 70–99)
Potassium: 2.4 mmol/L — CL (ref 3.5–5.1)
Sodium: 134 mmol/L — ABNORMAL LOW (ref 135–145)
Total Bilirubin: 3.1 mg/dL — ABNORMAL HIGH (ref 0.3–1.2)
Total Protein: 6.8 g/dL (ref 6.5–8.1)

## 2022-08-02 LAB — URINALYSIS, ROUTINE W REFLEX MICROSCOPIC
Bilirubin Urine: NEGATIVE
Glucose, UA: NEGATIVE mg/dL
Hgb urine dipstick: NEGATIVE
Ketones, ur: NEGATIVE mg/dL
Leukocytes,Ua: NEGATIVE
Nitrite: NEGATIVE
Protein, ur: NEGATIVE mg/dL
Specific Gravity, Urine: 1.004 — ABNORMAL LOW (ref 1.005–1.030)
pH: 7 (ref 5.0–8.0)

## 2022-08-02 LAB — MAGNESIUM: Magnesium: 1.8 mg/dL (ref 1.7–2.4)

## 2022-08-02 LAB — CBC
HCT: 35 % — ABNORMAL LOW (ref 39.0–52.0)
Hemoglobin: 13.1 g/dL (ref 13.0–17.0)
MCH: 38.9 pg — ABNORMAL HIGH (ref 26.0–34.0)
MCHC: 37.4 g/dL — ABNORMAL HIGH (ref 30.0–36.0)
MCV: 103.9 fL — ABNORMAL HIGH (ref 80.0–100.0)
Platelets: 158 10*3/uL (ref 150–400)
RBC: 3.37 MIL/uL — ABNORMAL LOW (ref 4.22–5.81)
RDW: 13.2 % (ref 11.5–15.5)
WBC: 9.1 10*3/uL (ref 4.0–10.5)
nRBC: 0 % (ref 0.0–0.2)

## 2022-08-02 LAB — LIPASE, BLOOD: Lipase: 68 U/L — ABNORMAL HIGH (ref 11–51)

## 2022-08-02 LAB — GRAM STAIN

## 2022-08-02 LAB — BRAIN NATRIURETIC PEPTIDE: B Natriuretic Peptide: 96 pg/mL (ref 0.0–100.0)

## 2022-08-02 LAB — PROTIME-INR
INR: 1.7 — ABNORMAL HIGH (ref 0.8–1.2)
Prothrombin Time: 20.1 seconds — ABNORMAL HIGH (ref 11.4–15.2)

## 2022-08-02 LAB — GLUCOSE, CAPILLARY: Glucose-Capillary: 188 mg/dL — ABNORMAL HIGH (ref 70–99)

## 2022-08-02 MED ORDER — ACETAMINOPHEN 650 MG RE SUPP: 650.0000 mg | Freq: Four times a day (QID) | RECTAL | Status: DC | PRN

## 2022-08-02 MED ORDER — POTASSIUM CHLORIDE CRYS ER 20 MEQ PO TBCR
60.0000 meq | EXTENDED_RELEASE_TABLET | Freq: Once | ORAL | Status: AC
  Administered 2022-08-02: 60 meq via ORAL
  Filled 2022-08-02: qty 3

## 2022-08-02 MED ORDER — THIAMINE HCL 100 MG/ML IJ SOLN
100.0000 mg | Freq: Every day | INTRAMUSCULAR | Status: DC
  Filled 2022-08-02: qty 2

## 2022-08-02 MED ORDER — IOHEXOL 300 MG/ML  SOLN
100.0000 mL | Freq: Once | INTRAMUSCULAR | Status: AC | PRN
  Administered 2022-08-02: 100 mL via INTRAVENOUS

## 2022-08-02 MED ORDER — MONTELUKAST SODIUM 10 MG PO TABS
10.0000 mg | ORAL_TABLET | Freq: Every day | ORAL | Status: DC
  Administered 2022-08-02 – 2022-08-04 (×3): 10 mg via ORAL
  Filled 2022-08-02 (×3): qty 1

## 2022-08-02 MED ORDER — POTASSIUM CHLORIDE 10 MEQ/100ML IV SOLN
INTRAVENOUS | Status: AC
  Administered 2022-08-02: 10 meq
  Filled 2022-08-02: qty 100

## 2022-08-02 MED ORDER — FLUOXETINE HCL 20 MG PO CAPS
40.0000 mg | ORAL_CAPSULE | Freq: Every day | ORAL | Status: DC
  Administered 2022-08-03 – 2022-08-05 (×3): 40 mg via ORAL
  Filled 2022-08-02 (×3): qty 2

## 2022-08-02 MED ORDER — FOLIC ACID 1 MG PO TABS
1.0000 mg | ORAL_TABLET | Freq: Every day | ORAL | Status: DC
  Administered 2022-08-03 – 2022-08-05 (×3): 1 mg via ORAL
  Filled 2022-08-02 (×3): qty 1

## 2022-08-02 MED ORDER — LORAZEPAM 2 MG/ML IJ SOLN: 1.0000 mg | INTRAMUSCULAR | Status: DC | PRN

## 2022-08-02 MED ORDER — ALBUMIN HUMAN 25 % IV SOLN
INTRAVENOUS | Status: AC
  Administered 2022-08-02: 25 g via INTRAVENOUS
  Filled 2022-08-02: qty 100

## 2022-08-02 MED ORDER — ONDANSETRON HCL 4 MG/2ML IJ SOLN: 4.0000 mg | Freq: Four times a day (QID) | INTRAMUSCULAR | Status: DC | PRN

## 2022-08-02 MED ORDER — INSULIN ASPART 100 UNIT/ML IJ SOLN
0.0000 [IU] | Freq: Every day | INTRAMUSCULAR | Status: DC
  Administered 2022-08-03 – 2022-08-04 (×2): 4 [IU] via SUBCUTANEOUS

## 2022-08-02 MED ORDER — ADULT MULTIVITAMIN W/MINERALS CH
1.0000 | ORAL_TABLET | Freq: Every day | ORAL | Status: DC
  Administered 2022-08-03 – 2022-08-05 (×3): 1 via ORAL
  Filled 2022-08-02 (×3): qty 1

## 2022-08-02 MED ORDER — HEPARIN SODIUM (PORCINE) 5000 UNIT/ML IJ SOLN
5000.0000 [IU] | Freq: Three times a day (TID) | INTRAMUSCULAR | Status: DC
  Administered 2022-08-02 – 2022-08-05 (×8): 5000 [IU] via SUBCUTANEOUS
  Filled 2022-08-02 (×9): qty 1

## 2022-08-02 MED ORDER — ALBUMIN HUMAN 25 % IV SOLN
25.0000 g | Freq: Once | INTRAVENOUS | Status: AC
  Filled 2022-08-02: qty 100

## 2022-08-02 MED ORDER — THIAMINE MONONITRATE 100 MG PO TABS
100.0000 mg | ORAL_TABLET | Freq: Every day | ORAL | Status: DC
  Administered 2022-08-03 – 2022-08-05 (×3): 100 mg via ORAL
  Filled 2022-08-02 (×3): qty 1

## 2022-08-02 MED ORDER — LORAZEPAM 1 MG PO TABS
1.0000 mg | ORAL_TABLET | ORAL | Status: DC | PRN
  Administered 2022-08-02: 2 mg via ORAL
  Administered 2022-08-05: 1 mg via ORAL
  Filled 2022-08-02: qty 1
  Filled 2022-08-02: qty 2

## 2022-08-02 MED ORDER — PREDNISOLONE 15 MG/5ML PO SOLN
40.0000 mg | Freq: Every day | ORAL | Status: DC
  Administered 2022-08-03 – 2022-08-05 (×3): 40 mg via ORAL
  Filled 2022-08-02 (×4): qty 15

## 2022-08-02 MED ORDER — INSULIN ASPART 100 UNIT/ML IJ SOLN
0.0000 [IU] | Freq: Three times a day (TID) | INTRAMUSCULAR | Status: DC
  Administered 2022-08-03: 3 [IU] via SUBCUTANEOUS
  Administered 2022-08-03: 1 [IU] via SUBCUTANEOUS
  Administered 2022-08-03: 3 [IU] via SUBCUTANEOUS
  Administered 2022-08-04: 7 [IU] via SUBCUTANEOUS
  Administered 2022-08-04: 5 [IU] via SUBCUTANEOUS
  Administered 2022-08-04: 3 [IU] via SUBCUTANEOUS
  Administered 2022-08-05: 2 [IU] via SUBCUTANEOUS
  Administered 2022-08-05: 5 [IU] via SUBCUTANEOUS

## 2022-08-02 MED ORDER — ACETAMINOPHEN 325 MG PO TABS: 650.0000 mg | ORAL_TABLET | Freq: Four times a day (QID) | ORAL | Status: DC | PRN

## 2022-08-02 MED ORDER — POTASSIUM CHLORIDE 10 MEQ/100ML IV SOLN
10.0000 meq | Freq: Once | INTRAVENOUS | Status: AC
  Administered 2022-08-02: 10 meq via INTRAVENOUS
  Filled 2022-08-02: qty 100

## 2022-08-02 MED ORDER — ONDANSETRON HCL 4 MG PO TABS: 4.0000 mg | ORAL_TABLET | Freq: Four times a day (QID) | ORAL | Status: DC | PRN

## 2022-08-02 MED ORDER — POTASSIUM CHLORIDE 10 MEQ/100ML IV SOLN
10.0000 meq | Freq: Once | INTRAVENOUS | Status: DC
  Administered 2022-08-02 (×2): 10 meq via INTRAVENOUS
  Filled 2022-08-02: qty 100

## 2022-08-02 MED ORDER — POTASSIUM CHLORIDE 10 MEQ/100ML IV SOLN
10.0000 meq | INTRAVENOUS | Status: AC
  Administered 2022-08-02: 10 meq via INTRAVENOUS
  Filled 2022-08-02: qty 100

## 2022-08-02 NOTE — Hospital Course (Signed)
57 year old male with a history of hypertension, hyperlipidemia, diabetes mellitus type 2, fatty liver, and depression presenting with 2-week history of increasing lower extremity edema and increasing abdominal girth.  The patient states that he was having trouble bending over to get his socks on on the day of admission.  He has had some subjective chills but denies any fever.  He denies any headache, chest pain, cough, hemoptysis, diarrhea, abdominal pain, dysuria, hematuria.  There is no hematochezia or melena.  He has intermittent nausea and vomiting a couple times a week without any blood or coffee grounds.  He has had some dyspnea on exertion.  He denies any orthopnea. The patient has drank alcohol daily for at least 30 years.  He continues to smoke about 1/2 pack/day.  He has approximately 20-pack-year history.  His last drink was in the evening of 08/01/2022. In the ED, the patient was afebrile and hemodynamically stable with oxygen saturation 100% room air.  WBC 9.1, hemoglobin 13.1, platelets 1-58,000.  Sodium 134, potassium 2.4, bicarbonate 20, BUN 5, creatinine 0.7.  AST 248, ALT 77, alkaline phosphatase 262, total bilirubin 3.1.  Lipase 68.  BNP 96.  CT of the abdomen and pelvis showed nodular liver.  There is moderate ascites.  There was gastric and esophageal varices.  There is mild edema of the colon from the cecum to the splenic flexure.  There was subcutaneous edema in the flanks.  The patient was started on IV furosemide.  GI was consulted to assist with management.

## 2022-08-02 NOTE — Procedures (Signed)
PreOperative Dx: Ascites Postoperative Dx: Ascites Procedure:   US guided paracentesis Radiologist:  Thornton Papas Anesthesia:  10 ml of 1% lidocaine Specimen:  3.5 L of yellow ascitic fluid EBL:   < 1 ml Complications:  None

## 2022-08-02 NOTE — ED Provider Notes (Signed)
[facility] Provider Note   CSN: [id number] Arrival date & time: 08/02/22  1112     History  Chief Complaint  Patient presents with   Leg Swelling    Shawn Velazquez is a 57 y.o. male with a past medical history of poorly controlled type 2 diabetes, hyperlipidemia, alcohol use disorder and hypertension presenting today with leg swelling.  Also reports his abdomen is swollen.  This has been going on for the past 5 days and he has been becoming increasingly dyspneic on exertion.  No history of heart failure.  Does report that he drinks alcohol daily.  Says that he drinks "two smash drinks a day."  Says that it is not every day.  HPI     Home Medications Prior to Admission medications   Medication Sig Start Date End Date Taking? Authorizing Provider  acetaminophen (TYLENOL) 325 MG tablet Take 650 mg by mouth as needed for mild pain, headache or moderate pain.   Yes [provider]  albuterol (VENTOLIN HFA) 108 (90 Base) MCG/ACT inhaler INHALE 2 PUFFS INTO THE LUNGS EVERY 6 HOURS AS NEEDED FOR WHEEZING OR SHORTNESS OF BREATH 05/18/21  Yes Lindell Spar, MD  amLODipine (NORVASC) 5 MG tablet Take 1 tablet (5 mg total) by mouth daily. 10/29/21  Yes Paseda, Folashade R, FNP  BELSOMRA 15 MG TABS Take 1 tablet by mouth daily. 07/22/22  Yes [provider]  FLUoxetine (PROZAC) 10 MG capsule TAKE 4 CAPSULES BY MOUTH EVERY DAY 02/17/22  Yes Paseda, Dewaine Conger, FNP  hydrochlorothiazide (HYDRODIURIL) 25 MG tablet Take 1 tablet (25 mg total) by mouth daily. 08/28/21  Yes Paseda, Dewaine Conger, FNP  insulin glargine (LANTUS SOLOSTAR) 100 UNIT/ML Solostar Pen Inject 35 Units into the skin at bedtime. 05/12/22  Yes Reardon, Juanetta Beets, NP  insulin lispro (HUMALOG KWIKPEN) 100 UNIT/ML KwikPen Inject 8-14 Units into the skin 3 (three) times daily. 05/12/22  Yes Reardon, Juanetta Beets, NP  losartan (COZAAR) 50 MG tablet Take 1 tablet (50 mg total) by  mouth daily. 08/28/21  Yes Paseda, Dewaine Conger, FNP  metFORMIN (GLUCOPHAGE-XR) 500 MG 24 hr tablet Take 1 tablet (500 mg total) by mouth 2 (two) times daily with a meal. 05/13/22  Yes Reardon, Whitney J, NP  montelukast (SINGULAIR) 10 MG tablet TAKE 1 TABLET(10 MG) BY MOUTH AT BEDTIME 12/10/21  Yes Paseda, Folashade R, FNP  Multiple Vitamin (MULTIVITAMIN) tablet Take 1 tablet by mouth daily.   Yes [provider]  omeprazole (PRILOSEC OTC) 20 MG tablet Take 20 mg by mouth every other day.   Yes [provider]  propranolol (INDERAL) 10 MG tablet Take 1 tablet by mouth 3 (three) times daily. 07/23/22  Yes [provider]  Continuous Blood Gluc Sensor (DEXCOM G7 SENSOR) MISC Inject 1 application. into the skin as directed. Change sensor every 10 days as directed. 12/09/21   Brita Romp, NP  glucose blood test strip Use as instructed to monitor glucose 4 times daily 12/09/21   Brita Romp, NP  Insulin Pen Needle (PEN NEEDLES) 32G X 4 MM MISC 1 each by Does not apply route in the morning, at noon, in the evening, and at bedtime. Use to inject insulin 4 times daily 05/12/22   Brita Romp, NP      Allergies    Neosporin + pain relief max st [neomy-bacit-polymyx-pramoxine]    Review of Systems   Review of Systems  Physical Exam Updated  Vital Signs BP 117/81   Pulse 75   Resp 18   Ht '5\' 6"'$  (1.676 m)   Wt 95.7 kg   SpO2 100%   BMI 34.06 kg/m  Physical Exam Vitals and nursing note reviewed.  Constitutional:      General: He is not in acute distress.    Appearance: Normal appearance. He is not ill-appearing.  HENT:     Head: Normocephalic and atraumatic.  Eyes:     General: No scleral icterus.    Conjunctiva/sclera: Conjunctivae normal.  Cardiovascular:     Rate and Rhythm: Normal rate and regular rhythm.  Pulmonary:     Effort: Pulmonary effort is normal. No respiratory distress.  Abdominal:     General: Abdomen is flat. There is distension.      Palpations: Abdomen is soft.  Musculoskeletal:     Right lower leg: Edema present.     Left lower leg: Edema present.     Comments: 1+ edema to the level of knees  Skin:    Findings: No rash.  Neurological:     Mental Status: He is alert.  Psychiatric:        Mood and Affect: Mood normal.     ED Results / Procedures / Treatments   Labs (all labs ordered are listed, but only abnormal results are displayed) Labs Reviewed  LIPASE, BLOOD - Abnormal; Notable for the following components:      Result Value   Lipase 68 (*)    All other components within normal limits  COMPREHENSIVE METABOLIC PANEL - Abnormal; Notable for the following components:   Sodium 134 (*)    Potassium 2.4 (*)    Chloride 96 (*)    Glucose, Bld 155 (*)    BUN 5 (*)    Calcium 8.4 (*)    Albumin 2.6 (*)    AST 248 (*)    ALT 77 (*)    Alkaline Phosphatase 262 (*)    Total Bilirubin 3.1 (*)    All other components within normal limits  CBC - Abnormal; Notable for the following components:   RBC 3.37 (*)    HCT 35.0 (*)    MCV 103.9 (*)    MCH 38.9 (*)    MCHC 37.4 (*)    All other components within normal limits  URINALYSIS, ROUTINE W REFLEX MICROSCOPIC - Abnormal; Notable for the following components:   Specific Gravity, Urine 1.004 (*)    All other components within normal limits  PROTIME-INR - Abnormal; Notable for the following components:   Prothrombin Time 20.1 (*)    INR 1.7 (*)    All other components within normal limits  BRAIN NATRIURETIC PEPTIDE  MAGNESIUM    EKG None  Radiology CT ABDOMEN PELVIS W CONTRAST  Result Date: 08/02/2022 CLINICAL DATA:  Abdominal pain, weakness, leg swelling and abdominal swelling for 1 week. EXAM: CT ABDOMEN AND PELVIS WITH CONTRAST TECHNIQUE: Multidetector CT imaging of the abdomen and pelvis was performed using the standard protocol following bolus administration of intravenous contrast. RADIATION DOSE REDUCTION: This exam was performed according to  the departmental dose-optimization program which includes automated exposure control, adjustment of the mA and/or kV according to patient size and/or use of iterative reconstruction technique. CONTRAST:  143m OMNIPAQUE IOHEXOL 300 MG/ML  SOLN COMPARISON:  02/21/2014 FINDINGS: Lower chest: No acute abnormality. No pleural fluid or airspace consolidation. Hepatobiliary: There is hypertrophy of the lateral segment of left hepatic lobe and caudate lobe of liver. The contour the  liver appears irregular and nodular. Heterogeneous appearance of the liver parenchyma with numerous tiny low-density foci are scattered throughout which measure up to 7 mm. These are new when compared with 02/21/2014. stones identified within the gallbladder. No significant gallbladder wall thickening or bile duct dilatation. Pancreas: Unremarkable. No pancreatic ductal dilatation or surrounding inflammatory changes. Spleen: Normal in size without focal abnormality. Adrenals/Urinary Tract: Adrenal glands are unremarkable. Kidneys are normal, without renal calculi, focal lesion, or hydronephrosis. Bladder is unremarkable. Stomach/Bowel: Stomach appears normal. No small bowel wall thickening, inflammation or distension. The appendix is visualized and is unremarkable. There is mild edema involving the colon from the cecum to the level of the splenic flexure Vascular/Lymphatic: Aortic atherosclerosis. The portal vein appears patent. Esophageal and gastric varices identified. Recanalization of the umbilical vein. No abdominopelvic adenopathy. Reproductive: Prostate is unremarkable. Other: There is a moderate volume of ascites within the abdomen and pelvis. No focal fluid collections identified. Right inguinal hernia contains fat as well as ascites. Musculoskeletal: Subcutaneous edema is identified along both flanks as well as the proximal portions of the lower extremities. No acute or suspicious osseous findings. IMPRESSION: 1. Morphologic features  of the liver compatible with cirrhosis. 2. Stigmata of portal venous hypertension identified including moderate volume of ascites, esophageal and gastric varices and recanalization of the umbilical vein. 3. Heterogeneous appearance of the liver parenchyma with numerous tiny low-density foci scattered throughout which measure up to 7 mm. These are new when compared with 02/21/2014. These are nonspecific and may reflect multiple dysplastic nodules. Consider follow-up imaging with nonemergent MRI of the liver without and with contrast material for further characterization 4. Gallstones. 5. Mild edema involving the colon from the cecum to the level of the splenic flexure. This is nonspecific and may be related to underlying liver disease. 6. Right inguinal hernia contains fat as well as ascites. 7. Subcutaneous edema is identified along both flanks as well as the proximal portions of the lower extremities. 8.  Aortic Atherosclerosis (ICD10-I70.0). Electronically Signed   By: Kerby Moors M.D.   On: 08/02/2022 13:38    Procedures Procedures   Medications Ordered in ED Medications  potassium chloride 10 mEq in 100 mL IVPB (has no administration in time range)  potassium chloride 10 mEq in 100 mL IVPB (10 mEq Intravenous New Bag/Given 08/02/22 1309)  potassium chloride SA (KLOR-CON M) CR tablet 60 mEq (has no administration in time range)  potassium chloride 10 mEq in 100 mL IVPB (has no administration in time range)  iohexol (OMNIPAQUE) 300 MG/ML solution 100 mL (100 mLs Intravenous Contrast Given 08/02/22 1325)    ED Course/ Medical Decision Making/ A&P Clinical Course as of 08/02/22 1408  Mon Aug 02, 2022  1402 CT ABDOMEN PELVIS W CONTRAST [AH]    Clinical Course User Index [AH] Rica Koyanagi                             Medical Decision Making Amount and/or Complexity of Data Reviewed Labs: ordered. Radiology: ordered.  Risk Prescription drug management. Decision regarding  hospitalization.     Past Medical History / Co-morbidities / Social History: Poorly controlled type 2 diabetes, hypertension and hyperlipidemia.  Also alcohol use disorder.   Additional history: Per chart review patient has had elevated liver enzymes for multiple years.  Also has a remote noted alcohol use disorder in 2021.  Previous imaging has revealed fatty liver.   Physical Exam: Pertinent physical  exam findings include Scleral icterus Ascites 3+ edema  Lab Tests: I ordered, and personally interpreted labs.  The pertinent results include: Potassium 2.4 ALT 77, AST 248, albumin 2.6, alk phos 262 and T. bili 3.1 Macrocemia   Imaging Studies: I ordered and independently visualized and interpreted CT and I agree with the radiologist that patient has varices and ascited 2/2 cirrhosis.     Consultations Obtained: I spoke with Dr. Merleen Milliner with gastroenterology and he says that their team will see the patient during his admission.  MDM/Disposition: This is a 57 year old male who presented today with leg swelling and abdominal swelling.  Has a history of alcohol use.  Workup remarkable for elevated liver enzymes and liver cirrhosis on CT.  Abdominal swelling is secondary to ascites.  At this time patient will require admission due to new onset liver cirrhosis.  GI to see the patient during his admission. Dr. Carles Collet Is the accepting physician.    I discussed this case with my attending physician Dr. Roderic Palau who cosigned this note including patient's presenting symptoms, physical exam, and planned diagnostics and interventions. Attending physician stated agreement with plan or made changes to plan which were implemented.     Final Clinical Impression(s) / ED Diagnoses Final diagnoses:  Alcoholic cirrhosis of liver with ascites ([place])  Hypokalemia    Rx / DC Orders ED Discharge Orders     None      Admit to Dr. Lavona Mound, [place], PA-C 08/02/22 1459    Milton Ferguson,  MD 08/03/22 1027

## 2022-08-02 NOTE — ED Notes (Signed)
Date and time results received: 08/02/22 1246 (use smartphrase ".now" to insert current time)  Test: postassium Critical Value: 2.4  Name of Provider Notified: Dr. Roderic Palau  Orders Received? Or Actions Taken?:  Pending orders

## 2022-08-02 NOTE — ED Triage Notes (Signed)
Pt had swelling that started in legs and now has worked up to his abdomen, started about a week ago, pt complains of weakness and SOB, pt denies any hx of CHF

## 2022-08-02 NOTE — Consult Note (Cosign Needed Addendum)
Gastroenterology Consult   Referring Provider: [name] Primary Care Physician:  Celene Squibb, MD Primary Gastroenterologist:  Dr. Abbey Chatters   Patient ID: Shawn Velazquez; [id number]; January 29, 1966   Admit date: 08/02/2022  LOS: 0 days   Date of Consultation: 08/02/2022  Reason for Consultation:  Decompensated cirrhosis   History of Present Illness   Shawn Velazquez is a 57 y.o. year old male with history of hepatic steatosis, alcohol use, elastography in Sept 2023 with fatty liver, presenting now to the ED with week long history of SOB, weakness, lower extremity edema, and abdominal distension.   In the ED: AST 248, ALT 77, Alk Phos 262, Tbili 3.1, Potassium 2.4, Na 134. Creatinine 0.87, Hgb 13.1, platelets 158, lipase 68, CT today with cirrhosis, stigmata of portal hypertension with moderate volume ascites, esophageal and gastric varices, possibly dysplastic nodules on CT with recommendations for MRI non-urgently.   He notes for the past 3 weeks, he has had worsening lower extremity edema, extending up legs. He then noted abdominal distension, bloating. Denies abdominal pain. No N/V. Tissue hematochezia 2 weeks ago but no bleeding currently. No mental status changes or confusion. No changes in bowel habits but did have acute viral illness with N/V/D in December now resolved.   He reports drinking up to 3-4 beers per day.      Colonoscopy 04/05/2022: -Preparation of the colon was fair. -Non-bleeding internal hemorrhoids. -Two 2 to 3 mm polyps in the cecum, removed with a cold snare. Resected and retrieved. -Three 4 to 8 mm polyps in the transverse colon, removed with a cold snare. Resected and retrieved. -One 8 mm polyp in the descending colon, removed with a cold snare. Resected and retrieved. -The examination was otherwise normal. -Tubular adenomas -Three year surveillance.    Past Medical History:  Diagnosis Date   Anxiety    Arthritis    affects hands, shoulder,  neck, knees, hips, ankles, toes   Chronic back pain    Diabetes mellitus    diet controlled   Eczema    Fatty liver, alcoholic    GERD (gastroesophageal reflux disease)    Glaucoma    slight case   Hypertension    Psoriasis    Sleep apnea    Substance abuse ([place])    Vertigo 09/11/2017   Wears glasses     Past Surgical History:  Procedure Laterality Date   COLONOSCOPY WITH PROPOFOL N/A 04/05/2022   Procedure: COLONOSCOPY WITH PROPOFOL;  Surgeon: [name], DO;  Location: [facility];  Service: Endoscopy;  Laterality: N/A;  10:45 AM,unable to reach pt to move up   [place]     POLYPECTOMY  04/05/2022   Procedure: POLYPECTOMY;  Surgeon: [name], DO;  Location: [facility];  Service: Endoscopy;;   POSTERIOR CERVICAL LAMINECTOMY  04/21/2012   Procedure: POSTERIOR CERVICAL LAMINECTOMY;  Surgeon: Elaina Hoops, MD;  Location: [place] [facility];  Service: Neurosurgery;  Laterality: Left;  Posterior Cervical laminectomy/foraminotomy and diskectomy, left cervical seven-thoracic one   ROTATOR CUFF REPAIR     THORACIC DISCECTOMY Left 04/03/2014   Procedure: Left Thoracic Seven to Eight, Thoracic Eight to Nine Thoracic Discectomy ;  Surgeon: Elaina Hoops, MD;  Location: [place] [facility];  Service: Neurosurgery;  Laterality: Left;    Prior to Admission medications   Medication Sig Start Date End Date Taking? Authorizing Provider  acetaminophen (TYLENOL) 325 MG tablet Take 650 mg by mouth as needed for mild pain,  headache or moderate pain.   Yes [provider]  albuterol (VENTOLIN HFA) 108 (90 Base) MCG/ACT inhaler INHALE 2 PUFFS INTO THE LUNGS EVERY 6 HOURS AS NEEDED FOR WHEEZING OR SHORTNESS OF BREATH 05/18/21  Yes Lindell Spar, MD  amLODipine (NORVASC) 5 MG tablet Take 1 tablet (5 mg total) by mouth daily. 10/29/21  Yes Paseda, Folashade R, FNP  BELSOMRA 15 MG TABS Take 1 tablet by mouth daily. 07/22/22  Yes [provider]  FLUoxetine (PROZAC) 10  MG capsule TAKE 4 CAPSULES BY MOUTH EVERY DAY 02/17/22  Yes Paseda, Dewaine Conger, FNP  hydrochlorothiazide (HYDRODIURIL) 25 MG tablet Take 1 tablet (25 mg total) by mouth daily. 08/28/21  Yes Paseda, Dewaine Conger, FNP  insulin glargine (LANTUS SOLOSTAR) 100 UNIT/ML Solostar Pen Inject 35 Units into the skin at bedtime. 05/12/22  Yes Reardon, Juanetta Beets, NP  insulin lispro (HUMALOG KWIKPEN) 100 UNIT/ML KwikPen Inject 8-14 Units into the skin 3 (three) times daily. 05/12/22  Yes Reardon, Juanetta Beets, NP  losartan (COZAAR) 50 MG tablet Take 1 tablet (50 mg total) by mouth daily. 08/28/21  Yes Paseda, Dewaine Conger, FNP  metFORMIN (GLUCOPHAGE-XR) 500 MG 24 hr tablet Take 1 tablet (500 mg total) by mouth 2 (two) times daily with a meal. 05/13/22  Yes Reardon, Whitney J, NP  montelukast (SINGULAIR) 10 MG tablet TAKE 1 TABLET(10 MG) BY MOUTH AT BEDTIME 12/10/21  Yes Paseda, Folashade R, FNP  Multiple Vitamin (MULTIVITAMIN) tablet Take 1 tablet by mouth daily.   Yes [provider]  omeprazole (PRILOSEC OTC) 20 MG tablet Take 20 mg by mouth every other day.   Yes [provider]  propranolol (INDERAL) 10 MG tablet Take 1 tablet by mouth 3 (three) times daily. 07/23/22  Yes [provider]  Continuous Blood Gluc Sensor (DEXCOM G7 SENSOR) MISC Inject 1 application. into the skin as directed. Change sensor every 10 days as directed. 12/09/21   Brita Romp, NP  glucose blood test strip Use as instructed to monitor glucose 4 times daily 12/09/21   Brita Romp, NP  Insulin Pen Needle (PEN NEEDLES) 32G X 4 MM MISC 1 each by Does not apply route in the morning, at noon, in the evening, and at bedtime. Use to inject insulin 4 times daily 05/12/22   Brita Romp, NP    Current Facility-Administered Medications  Medication Dose Route Frequency Provider Last Rate Last Admin   potassium chloride 10 mEq in 100 mL IVPB  10 mEq Intravenous Once Milton Ferguson, MD       potassium chloride 10 mEq  in 100 mL IVPB  10 mEq Intravenous Q1 Hr x 3 Redwine, Madison A, PA-C       potassium chloride SA (KLOR-CON M) CR tablet 60 mEq  60 mEq Oral Once Redwine, Madison A, PA-C       Current Outpatient Medications  Medication Sig Dispense Refill   acetaminophen (TYLENOL) 325 MG tablet Take 650 mg by mouth as needed for mild pain, headache or moderate pain.     albuterol (VENTOLIN HFA) 108 (90 Base) MCG/ACT inhaler INHALE 2 PUFFS INTO THE LUNGS EVERY 6 HOURS AS NEEDED FOR WHEEZING OR SHORTNESS OF BREATH 20.1 g 0   amLODipine (NORVASC) 5 MG tablet Take 1 tablet (5 mg total) by mouth daily. 90 tablet 1   BELSOMRA 15 MG TABS Take 1 tablet by mouth daily.     FLUoxetine (PROZAC) 10 MG capsule TAKE 4 CAPSULES BY MOUTH EVERY DAY  260 capsule 0   hydrochlorothiazide (HYDRODIURIL) 25 MG tablet Take 1 tablet (25 mg total) by mouth daily. 90 tablet 1   insulin glargine (LANTUS SOLOSTAR) 100 UNIT/ML Solostar Pen Inject 35 Units into the skin at bedtime. 30 mL 3   insulin lispro (HUMALOG KWIKPEN) 100 UNIT/ML KwikPen Inject 8-14 Units into the skin 3 (three) times daily. 40 mL 3   losartan (COZAAR) 50 MG tablet Take 1 tablet (50 mg total) by mouth daily. 90 tablet 1   metFORMIN (GLUCOPHAGE-XR) 500 MG 24 hr tablet Take 1 tablet (500 mg total) by mouth 2 (two) times daily with a meal. 180 tablet 0   montelukast (SINGULAIR) 10 MG tablet TAKE 1 TABLET(10 MG) BY MOUTH AT BEDTIME 30 tablet 3   Multiple Vitamin (MULTIVITAMIN) tablet Take 1 tablet by mouth daily.     omeprazole (PRILOSEC OTC) 20 MG tablet Take 20 mg by mouth every other day.     propranolol (INDERAL) 10 MG tablet Take 1 tablet by mouth 3 (three) times daily.     Continuous Blood Gluc Sensor (DEXCOM G7 SENSOR) MISC Inject 1 application. into the skin as directed. Change sensor every 10 days as directed. 6 each 3   glucose blood test strip Use as instructed to monitor glucose 4 times daily 100 each 12   Insulin Pen Needle (PEN NEEDLES) 32G X 4 MM MISC 1 each  by Does not apply route in the morning, at noon, in the evening, and at bedtime. Use to inject insulin 4 times daily 200 each 3    Allergies as of 08/02/2022 - Review Complete 08/02/2022  Allergen Reaction Noted   Neosporin + pain relief max st [neomy-bacit-polymyx-pramoxine] Rash 04/19/2012    Family History  Problem Relation Age of Onset   Diabetes Father    Cancer - Lung Father    Hypertension Other    Cancer - Lung Other     Social History   Socioeconomic History   Marital status: Divorced    Spouse name: Not on file   Number of children: Not on file   Years of education: Not on file   Highest education level: Not on file  Occupational History    Comment: Truck to storage  Tobacco Use   Smoking status: Former    Packs/day: 0.25    Years: 40.00    Total pack years: 10.00    Types: E-cigarettes, Cigarettes    Quit date: 05/09/2022    Years since quitting: 0.2   Smokeless tobacco: Never   Tobacco comments:    pt trying to quit on his own  Vaping Use   Vaping Use: Former  Substance and Sexual Activity   Alcohol use: Yes    Alcohol/week: 12.0 standard drinks of alcohol    Types: 12 Cans of beer per week    Comment: Either Smirnoff Smash-4 per day or Fireball-10 airplane bottles in a day   Drug use: No   Sexual activity: Yes    Birth control/protection: None  Other Topics Concern   Not on file  Social History Narrative   Not on file   Social Determinants of Health   Financial Resource Strain: Not on file  Food Insecurity: Not on file  Transportation Needs: Not on file  Physical Activity: Not on file  Stress: Not on file  Social Connections: Not on file  Intimate Partner Violence: Not on file     Review of Systems   Gen: Denies any fever, chills, loss of appetite,  change in weight or weight loss CV: Denies chest pain, heart palpitations, syncope, edema  Resp: Denies shortness of breath with rest, cough, wheezing, coughing up blood, and pleurisy. GI:  see HPI GU : Denies urinary burning, blood in urine, urinary frequency, and urinary incontinence. MS: Denies joint pain, limitation of movement, swelling, cramps, and atrophy.  Derm: Denies rash, itching, dry skin, hives. Psych: Denies depression, anxiety, memory loss, hallucinations, and confusion. Heme: Denies bruising or bleeding Neuro:  Denies any headaches, dizziness, paresthesias, shaking  Physical Exam   Vital Signs in last 24 hours: Pulse Rate:  [62-75] 75 (01/29 1353) Resp:  [16-18] 18 (01/29 1353) BP: (115-117)/(73-81) 117/81 (01/29 1353) SpO2:  [99 %-100 %] 100 % (01/29 1353) Weight:  [95.7 kg] 95.7 kg (01/29 1128)    General:   Alert,  Well-developed, well-nourished, pleasant and cooperative in NAD Head:  Normocephalic and atraumatic. Eyes:  Sclera clear, no icterus.    Ears:  Normal auditory acuity. Mouth:  No deformity or lesions, dentition normal. Lungs:  Clear throughout to auscultation.    Heart:  S1 S2 present without murmurs Abdomen:  +BS, no TTP, tense ascites, anasarca to flanks, no rebound or guarding   Rectal: deferred   Msk:  Symmetrical without gross deformities. Normal posture. Extremities:  With pedal edema, 2+ pitting edema to knee, 1+ extending above knees Neurologic:  Alert and  oriented x4. Skin:  Intact without significant lesions or rashes. Psych:  Alert and cooperative. Normal mood and affect.  Intake/Output from previous day: No intake/output data recorded. Intake/Output this shift: No intake/output data recorded.   Labs/Studies   Recent Labs Recent Labs    08/02/22 1224  WBC 9.1  HGB 13.1  HCT 35.0*  PLT 158   BMET Recent Labs    08/02/22 1224  NA 134*  K 2.4*  CL 96*  CO2 28  GLUCOSE 155*  BUN 5*  CREATININE 0.87  CALCIUM 8.4*   LFT Recent Labs    08/02/22 1224  PROT 6.8  ALBUMIN 2.6*  AST 248*  ALT 77*  ALKPHOS 262*  BILITOT 3.1*   Lab Results  Component Value Date   INR 1.7 (H) 08/02/2022   INR 1.2  03/09/2022     Radiology/Studies DG Chest Portable 1 View  Result Date: 08/02/2022 CLINICAL DATA:  Shortness of breath EXAM: PORTABLE CHEST 1 VIEW COMPARISON:  09/11/2017 FINDINGS: The heart size and mediastinal contours are within normal limits. Mild diffuse interstitial opacity. The visualized skeletal structures are unremarkable. IMPRESSION: Mild diffuse bilateral interstitial pulmonary opacity, suggesting edema or atypical/viral infection. No focal airspace opacity. Electronically Signed   By: Delanna Ahmadi M.D.   On: 08/02/2022 14:27   CT ABDOMEN PELVIS W CONTRAST  Result Date: 08/02/2022 CLINICAL DATA:  Abdominal pain, weakness, leg swelling and abdominal swelling for 1 week. EXAM: CT ABDOMEN AND PELVIS WITH CONTRAST TECHNIQUE: Multidetector CT imaging of the abdomen and pelvis was performed using the standard protocol following bolus administration of intravenous contrast. RADIATION DOSE REDUCTION: This exam was performed according to the departmental dose-optimization program which includes automated exposure control, adjustment of the mA and/or kV according to patient size and/or use of iterative reconstruction technique. CONTRAST:  129m OMNIPAQUE IOHEXOL 300 MG/ML  SOLN COMPARISON:  02/21/2014 FINDINGS: Lower chest: No acute abnormality. No pleural fluid or airspace consolidation. Hepatobiliary: There is hypertrophy of the lateral segment of left hepatic lobe and caudate lobe of liver. The contour the liver appears irregular and nodular. Heterogeneous appearance of the  liver parenchyma with numerous tiny low-density foci are scattered throughout which measure up to 7 mm. These are new when compared with 02/21/2014. stones identified within the gallbladder. No significant gallbladder wall thickening or bile duct dilatation. Pancreas: Unremarkable. No pancreatic ductal dilatation or surrounding inflammatory changes. Spleen: Normal in size without focal abnormality. Adrenals/Urinary Tract: Adrenal  glands are unremarkable. Kidneys are normal, without renal calculi, focal lesion, or hydronephrosis. Bladder is unremarkable. Stomach/Bowel: Stomach appears normal. No small bowel wall thickening, inflammation or distension. The appendix is visualized and is unremarkable. There is mild edema involving the colon from the cecum to the level of the splenic flexure Vascular/Lymphatic: Aortic atherosclerosis. The portal vein appears patent. Esophageal and gastric varices identified. Recanalization of the umbilical vein. No abdominopelvic adenopathy. Reproductive: Prostate is unremarkable. Other: There is a moderate volume of ascites within the abdomen and pelvis. No focal fluid collections identified. Right inguinal hernia contains fat as well as ascites. Musculoskeletal: Subcutaneous edema is identified along both flanks as well as the proximal portions of the lower extremities. No acute or suspicious osseous findings. IMPRESSION: 1. Morphologic features of the liver compatible with cirrhosis. 2. Stigmata of portal venous hypertension identified including moderate volume of ascites, esophageal and gastric varices and recanalization of the umbilical vein. 3. Heterogeneous appearance of the liver parenchyma with numerous tiny low-density foci scattered throughout which measure up to 7 mm. These are new when compared with 02/21/2014. These are nonspecific and may reflect multiple dysplastic nodules. Consider follow-up imaging with nonemergent MRI of the liver without and with contrast material for further characterization 4. Gallstones. 5. Mild edema involving the colon from the cecum to the level of the splenic flexure. This is nonspecific and may be related to underlying liver disease. 6. Right inguinal hernia contains fat as well as ascites. 7. Subcutaneous edema is identified along both flanks as well as the proximal portions of the lower extremities. 8.  Aortic Atherosclerosis (ICD10-I70.0). Electronically Signed    By: Kerby Moors M.D.   On: 08/02/2022 13:38     Assessment   Shawn Velazquez is a 57 y.o. year old male with history of hepatic steatosis, alcohol use, elastography in Sept 2023 with fatty liver, presenting now to the ED with week long history of SOB, weakness, lower extremity edema, and abdominal distension.   Now presenting with decompensated cirrhosis with ascites/anasarca and alcoholic hepatitis in setting of continued alcohol intake. MELD 3.0 is 19 today. DF is 35.8 with PT control of 13.    Decompensated cirrhosis: [place] para, first ever today. Fluid analysis ordered. Will ensure negative for SBP. Low suspicion for SBP at this point. Will check AFP. Will need outpatient EGD for variceal screening. Absolute ETOH cessation. Recommend starting low dose diuretics tomorrow but electrolytes will need to be addressed first.   Alcoholic hepatitis: will start prednisolone as long as negative cell count. Prior Hep B, C serologies negative in Sept 2023. No other acute signs of infection.   Low-density liver lesions: recommend outpatient MRI. Possibly may be dysplastic nodules. Check AFP.    Hypokalemia: per hospitalist.     Plan / Recommendations    Check AFP.  Daily CMP, INR [place] para now with fluid analysis. I have also ordered IV albumin at start of para If negative for SBP, will start prednisolone Low sodium diet Potassium replacement Outpatient MRI Absolute ETOH cessation Will reassess tomorrow regarding candidacy for diuresis     08/02/2022, 2:31 PM  [name], PhD, ANP-BC [facility]

## 2022-08-02 NOTE — Progress Notes (Signed)
Patient tolerated right sided paracentesis procedure well today and 3.5 Liters of clear yellow ascites removed with labs collected and sent for processing. PT verbalized understanding of post procedure instructions and transported back to ED room assignment at this time via stretcher with no acute distress noted.

## 2022-08-02 NOTE — H&P (Addendum)
History and Physical    Patient: Shawn Velazquez WNU:[id number] DOB: 10-13-1965 DOA: 08/02/2022 DOS: the patient was seen and examined on 08/02/2022 PCP: Celene Squibb, MD  Patient coming from: Home  Chief Complaint:  Chief Complaint  Patient presents with   Leg Swelling   HPI: Shawn Velazquez is a 57 year old male with a history of hypertension, hyperlipidemia, diabetes mellitus type 2, fatty liver, and depression presenting with 2-week history of increasing lower extremity edema and increasing abdominal girth.  The patient states that he was having trouble bending over to get his socks on on the day of admission.  He has had some subjective chills but denies any fever.  He denies any headache, chest pain, cough, hemoptysis, diarrhea, abdominal pain, dysuria, hematuria.  There is no hematochezia or melena.  He has intermittent nausea and vomiting a couple times a week without any blood or coffee grounds.  He has had some dyspnea on exertion.  He denies any orthopnea. The patient has drank alcohol daily for at least 30 years.  He continues to smoke about 1/2 pack/day.  He has approximately 20-pack-year history.  His last drink was in the evening of 08/01/2022. In the ED, the patient was afebrile and hemodynamically stable with oxygen saturation 100% room air.  WBC 9.1, hemoglobin 13.1, platelets 1-58,000.  Sodium 134, potassium 2.4, bicarbonate 20, BUN 5, creatinine 0.7.  AST 248, ALT 77, alkaline phosphatase 262, total bilirubin 3.1.  Lipase 68.  BNP 96.  CT of the abdomen and pelvis showed nodular liver.  There is moderate ascites.  There was gastric and esophageal varices.  There is mild edema of the colon from the cecum to the splenic flexure.  There was subcutaneous edema in the flanks.  The patient was started on IV furosemide.  GI was consulted to assist with management.  Review of Systems: As mentioned in the history of present illness. All other systems reviewed and are negative. Past  Medical History:  Diagnosis Date   Anxiety    Arthritis    affects hands, shoulder, neck, knees, hips, ankles, toes   Chronic back pain    Diabetes mellitus    diet controlled   Eczema    Fatty liver, alcoholic    GERD (gastroesophageal reflux disease)    Glaucoma    slight case   Hypertension    Psoriasis    Sleep apnea    Substance abuse ([place])    Vertigo 09/11/2017   Wears glasses    Past Surgical History:  Procedure Laterality Date   COLONOSCOPY WITH PROPOFOL N/A 04/05/2022   Procedure: COLONOSCOPY WITH PROPOFOL;  Surgeon: [name], DO;  Location: [facility];  Service: Endoscopy;  Laterality: N/A;  10:45 AM,unable to reach pt to move up   [place]     POLYPECTOMY  04/05/2022   Procedure: POLYPECTOMY;  Surgeon: [name], DO;  Location: [facility];  Service: Endoscopy;;   POSTERIOR CERVICAL LAMINECTOMY  04/21/2012   Procedure: POSTERIOR CERVICAL LAMINECTOMY;  Surgeon: Elaina Hoops, MD;  Location: [facility];  Service: Neurosurgery;  Laterality: Left;  Posterior Cervical laminectomy/foraminotomy and diskectomy, left cervical seven-thoracic one   ROTATOR CUFF REPAIR     THORACIC DISCECTOMY Left 04/03/2014   Procedure: Left Thoracic Seven to Eight, Thoracic Eight to Nine Thoracic Discectomy ;  Surgeon: Elaina Hoops, MD;  Location: [place] [facility];  Service: Neurosurgery;  Laterality: Left;   Social History:  reports that he quit  smoking about 2 months ago. His smoking use included e-cigarettes and cigarettes. He has a 10.00 pack-year smoking history. He has never used smokeless tobacco. He reports current alcohol use of about 12.0 standard drinks of alcohol per week. He reports that he does not use drugs.  Allergies  Allergen Reactions   Neosporin + Pain Relief Max St [Neomy-Bacit-Polymyx-Pramoxine] Rash    Family History  Problem Relation Age of Onset   Diabetes Father    Cancer - Lung Father    Hypertension Other    Cancer - Lung Other      Prior to Admission medications   Medication Sig Start Date End Date Taking? Authorizing Provider  acetaminophen (TYLENOL) 325 MG tablet Take 650 mg by mouth as needed for mild pain, headache or moderate pain.   Yes [provider]  albuterol (VENTOLIN HFA) 108 (90 Base) MCG/ACT inhaler INHALE 2 PUFFS INTO THE LUNGS EVERY 6 HOURS AS NEEDED FOR WHEEZING OR SHORTNESS OF BREATH 05/18/21  Yes Lindell Spar, MD  amLODipine (NORVASC) 5 MG tablet Take 1 tablet (5 mg total) by mouth daily. 10/29/21  Yes Paseda, Folashade R, FNP  BELSOMRA 15 MG TABS Take 1 tablet by mouth daily. 07/22/22  Yes [provider]  FLUoxetine (PROZAC) 10 MG capsule TAKE 4 CAPSULES BY MOUTH EVERY DAY 02/17/22  Yes Paseda, Dewaine Conger, FNP  hydrochlorothiazide (HYDRODIURIL) 25 MG tablet Take 1 tablet (25 mg total) by mouth daily. 08/28/21  Yes Paseda, Dewaine Conger, FNP  insulin glargine (LANTUS SOLOSTAR) 100 UNIT/ML Solostar Pen Inject 35 Units into the skin at bedtime. 05/12/22  Yes Reardon, Juanetta Beets, NP  insulin lispro (HUMALOG KWIKPEN) 100 UNIT/ML KwikPen Inject 8-14 Units into the skin 3 (three) times daily. 05/12/22  Yes Reardon, Juanetta Beets, NP  losartan (COZAAR) 50 MG tablet Take 1 tablet (50 mg total) by mouth daily. 08/28/21  Yes Paseda, Dewaine Conger, FNP  metFORMIN (GLUCOPHAGE-XR) 500 MG 24 hr tablet Take 1 tablet (500 mg total) by mouth 2 (two) times daily with a meal. 05/13/22  Yes Reardon, Whitney J, NP  montelukast (SINGULAIR) 10 MG tablet TAKE 1 TABLET(10 MG) BY MOUTH AT BEDTIME 12/10/21  Yes Paseda, Folashade R, FNP  Multiple Vitamin (MULTIVITAMIN) tablet Take 1 tablet by mouth daily.   Yes [provider]  omeprazole (PRILOSEC OTC) 20 MG tablet Take 20 mg by mouth every other day.   Yes [provider]  propranolol (INDERAL) 10 MG tablet Take 1 tablet by mouth 3 (three) times daily. 07/23/22  Yes [provider]  Continuous Blood Gluc Sensor (DEXCOM G7 SENSOR) MISC Inject 1  application. into the skin as directed. Change sensor every 10 days as directed. 12/09/21   Brita Romp, NP  glucose blood test strip Use as instructed to monitor glucose 4 times daily 12/09/21   Brita Romp, NP  Insulin Pen Needle (PEN NEEDLES) 32G X 4 MM MISC 1 each by Does not apply route in the morning, at noon, in the evening, and at bedtime. Use to inject insulin 4 times daily 05/12/22   Brita Romp, NP    Physical Exam: Vitals:   08/02/22 1500 08/02/22 1530 08/02/22 1545 08/02/22 1600  BP: 129/78 127/77  112/69  Pulse: 71 68 67 66  Resp: 20 (!) '22 17 18  '$ SpO2: 97% 98% 99% 100%  Weight:      Height:      GENERAL:  A&O x 3, NAD, well developed, cooperative, follows commands HEENT: Section/AT,  No thrush, No icterus, No oral ulcers Neck:  No neck mass, No meningismus, soft, supple CV: RRR, no S3, no S4, no rub, no JVD Lungs:  CTA, no wheeze, no rhonchi, good air movement Abd: soft/NT +BS, +distended; + anasarca Ext: 2 + LE edema, no lymphangitis, no cyanosis, no rashes Neuro:  CN II-XII intact, strength 4/5 in RUE, RLE, strength 4/5 LUE, LLE; sensation intact bilateral; no dysmetria; babinski equivocal  Data Reviewed: Data reviewed above  Assessment and Plan: Decompensated liver cirrhosis with anasarca -Start IV furosemide -Start spironolactone -Echocardiogram -Urine protein creatinine ratio -Check alpha-fetoprotein -Accurate I's and O's -Daily weights -08/02/2022 CT abd/pelvis -Patient has had a history of fatty liver noted on imaging--likely ASH and NASH -Previous extensive workup for elevated LFTs>> Acute hepatitis panel negative, alpha-1 antitrypsin level normal, ANA negative, anti-smooth muscle antibody negative, iron/TIBC/ferritin normal, AMA negative, hepatitis B surface antibody nonreactive, immunoglobulins unremarkable, TTG IgA less than 2.  -paracentesis>>send for [facility]  Hypokalemia -Replete -Magnesium 1.8 -Hold HCTZ  Hypervolemic  hyponatremia -Anticipate improvement with IV diuresis  Transaminasemia -Previous extensive workup for elevated LFTs>> Acute hepatitis panel negative, alpha-1 antitrypsin level normal, ANA negative, anti-smooth muscle antibody negative, iron/TIBC/ferritin normal, AMA negative, hepatitis B surface antibody nonreactive, immunoglobulins unremarkable, TTG IgA less than 2.   Controlled diabetes mellitus type 2  -NovoLog sliding scale -Reduced dose Semglee -Hold metformin  Essential hypertension -Holding amlodipine and losartan to allow for blood pressure margin for diuresis  Class I obesity -BMI 34.06 -Lifestyle modification  Elevated Lipase -not clinically significant  Alcohol abuse -CIWA  Anxiety/depression -continue fluoxetine   Advance Care Planning: FULL  Consults: GI  Family Communication: spouse at bedside 1/29  Severity of Illness: The appropriate patient status for this patient is INPATIENT. Inpatient status is judged to be reasonable and necessary in order to provide the required intensity of service to ensure the patient's safety. The patient's presenting symptoms, physical exam findings, and initial radiographic and laboratory data in the context of their chronic comorbidities is felt to place them at high risk for further clinical deterioration. Furthermore, it is not anticipated that the patient will be medically stable for discharge from the hospital within 2 midnights of admission.   * I certify that at the point of admission it is my clinical judgment that the patient will require inpatient hospital care spanning beyond 2 midnights from the point of admission due to high intensity of service, high risk for further deterioration and high frequency of surveillance required.*  Author: Orson Eva, MD 08/02/2022 4:03 PM  For on call review [email or web address].

## 2022-08-03 ENCOUNTER — Inpatient Hospital Stay (HOSPITAL_COMMUNITY): Payer: BC Managed Care – PPO

## 2022-08-03 DIAGNOSIS — E876 Hypokalemia: Secondary | ICD-10-CM

## 2022-08-03 DIAGNOSIS — E669 Obesity, unspecified: Secondary | ICD-10-CM | POA: Diagnosis not present

## 2022-08-03 DIAGNOSIS — K7031 Alcoholic cirrhosis of liver with ascites: Secondary | ICD-10-CM | POA: Diagnosis not present

## 2022-08-03 DIAGNOSIS — F1010 Alcohol abuse, uncomplicated: Secondary | ICD-10-CM

## 2022-08-03 DIAGNOSIS — R0609 Other forms of dyspnea: Secondary | ICD-10-CM

## 2022-08-03 DIAGNOSIS — R601 Generalized edema: Secondary | ICD-10-CM

## 2022-08-03 LAB — ECHOCARDIOGRAM COMPLETE
Area-P 1/2: 4.31 cm2
Height: 66 in
S' Lateral: 2.9 cm
Weight: 3188.73 oz

## 2022-08-03 LAB — COMPREHENSIVE METABOLIC PANEL
ALT: 61 U/L — ABNORMAL HIGH (ref 0–44)
AST: 206 U/L — ABNORMAL HIGH (ref 15–41)
Albumin: 2.5 g/dL — ABNORMAL LOW (ref 3.5–5.0)
Alkaline Phosphatase: 232 U/L — ABNORMAL HIGH (ref 38–126)
Anion gap: 8 (ref 5–15)
BUN: 6 mg/dL (ref 6–20)
CO2: 26 mmol/L (ref 22–32)
Calcium: 8 mg/dL — ABNORMAL LOW (ref 8.9–10.3)
Chloride: 100 mmol/L (ref 98–111)
Creatinine, Ser: 0.78 mg/dL (ref 0.61–1.24)
GFR, Estimated: >60 mL/min (ref >60)
Glucose, Bld: 155 mg/dL — ABNORMAL HIGH (ref 70–99)
Potassium: 3 mmol/L — ABNORMAL LOW (ref 3.5–5.1)
Sodium: 134 mmol/L — ABNORMAL LOW (ref 135–145)
Total Bilirubin: 3.3 mg/dL — ABNORMAL HIGH (ref 0.3–1.2)
Total Protein: 5.9 g/dL — ABNORMAL LOW (ref 6.5–8.1)

## 2022-08-03 LAB — PROTIME-INR
INR: 1.8 — ABNORMAL HIGH (ref 0.8–1.2)
Prothrombin Time: 21 seconds — ABNORMAL HIGH (ref 11.4–15.2)

## 2022-08-03 LAB — CBC
HCT: 30.5 % — ABNORMAL LOW (ref 39.0–52.0)
Hemoglobin: 11.3 g/dL — ABNORMAL LOW (ref 13.0–17.0)
MCH: 38.6 pg — ABNORMAL HIGH (ref 26.0–34.0)
MCHC: 37 g/dL — ABNORMAL HIGH (ref 30.0–36.0)
MCV: 104.1 fL — ABNORMAL HIGH (ref 80.0–100.0)
Platelets: 111 10*3/uL — ABNORMAL LOW (ref 150–400)
RBC: 2.93 MIL/uL — ABNORMAL LOW (ref 4.22–5.81)
RDW: 13.4 % (ref 11.5–15.5)
WBC: 9.1 10*3/uL (ref 4.0–10.5)
nRBC: 0 % (ref 0.0–0.2)

## 2022-08-03 LAB — GLUCOSE, CAPILLARY
Glucose-Capillary: 138 mg/dL — ABNORMAL HIGH (ref 70–99)
Glucose-Capillary: 269 mg/dL — ABNORMAL HIGH (ref 70–99)
Glucose-Capillary: 275 mg/dL — ABNORMAL HIGH (ref 70–99)
Glucose-Capillary: 335 mg/dL — ABNORMAL HIGH (ref 70–99)

## 2022-08-03 LAB — HIV ANTIBODY (ROUTINE TESTING W REFLEX): HIV Screen 4th Generation wRfx: NONREACTIVE

## 2022-08-03 LAB — TSH: TSH: 1.256 u[IU]/mL (ref 0.350–4.500)

## 2022-08-03 LAB — HEMOGLOBIN A1C
Hgb A1c MFr Bld: 5.1 % (ref 4.8–5.6)
Mean Plasma Glucose: 99.67 mg/dL

## 2022-08-03 MED ORDER — SPIRONOLACTONE 25 MG PO TABS
100.0000 mg | ORAL_TABLET | Freq: Every day | ORAL | Status: DC
  Administered 2022-08-03 – 2022-08-05 (×3): 100 mg via ORAL
  Filled 2022-08-03 (×3): qty 4

## 2022-08-03 MED ORDER — MELATONIN 3 MG PO TABS
6.0000 mg | ORAL_TABLET | Freq: Once | ORAL | Status: AC
  Administered 2022-08-03: 6 mg via ORAL
  Filled 2022-08-03: qty 2

## 2022-08-03 MED ORDER — POTASSIUM CHLORIDE CRYS ER 20 MEQ PO TBCR
40.0000 meq | EXTENDED_RELEASE_TABLET | Freq: Two times a day (BID) | ORAL | Status: DC
  Administered 2022-08-03 – 2022-08-05 (×5): 40 meq via ORAL
  Filled 2022-08-03 (×5): qty 2

## 2022-08-03 MED ORDER — GADOBUTROL 1 MMOL/ML IV SOLN
10.0000 mL | Freq: Once | INTRAVENOUS | Status: AC | PRN
  Administered 2022-08-03: 10 mL via INTRAVENOUS

## 2022-08-03 MED ORDER — FUROSEMIDE 10 MG/ML IJ SOLN
40.0000 mg | Freq: Every day | INTRAMUSCULAR | Status: DC
  Administered 2022-08-03 – 2022-08-05 (×3): 40 mg via INTRAVENOUS
  Filled 2022-08-03 (×3): qty 4

## 2022-08-03 NOTE — Progress Notes (Signed)
Subjective: Feeling ok this morning. Tired. Took him a while to go to sleep last night.  Reports abdominal distention has improved, close to normal.  Swelling in legs about the same rate started couple weeks ago.  Denies confusion, trouble thinking, disorientation.  Had 3 bowel movements yesterday.  No hematochezia or melena.  No abdominal pain, nausea, vomiting.  Fianc at bedside.  Objective: Vital signs in last 24 hours: Temp:  [97.9 F (36.6 C)-99.1 F (37.3 C)] 98.7 F (37.1 C) (01/30 0522) Pulse Rate:  [62-88] 75 (01/30 0522) Resp:  [13-22] 20 (01/30 0522) BP: (109-137)/(66-81) 137/77 (01/30 0522) SpO2:  [95 %-100 %] 95 % (01/30 0522) FiO2 (%):  [21 %] 21 % (01/29 2153) Weight:  [90.4 kg-95.7 kg] 90.4 kg (01/29 1700) Last BM Date : 08/02/22 General:   Alert and oriented, pleasant, NAD, jaundiced.  Head:  Normocephalic and atraumatic. Eyes:  + scleral icterus. Abdomen:  Protuberant but soft abdomen. Non-tender. No rebound or guarding. Dry, clean dressing in right abdomen.  Msk:  Symmetrical without gross deformities.  Extremities:  With 3+ edema in lower legs. 1-2+ pitting edema extending to knees.  Neurologic:  Alert and  oriented x4;  grossly normal neurologically. No asterixis.  Psych:   Normal mood and affect.  Intake/Output from previous day: 01/29 0701 - 01/30 0700 In: 240 [P.O.:240] Out: -  Intake/Output this shift: No intake/output data recorded.  Lab Results: Recent Labs    08/02/22 1224 08/03/22 0452  WBC 9.1 9.1  HGB 13.1 11.3*  HCT 35.0* 30.5*  PLT 158 111*   BMET Recent Labs    08/02/22 1224 08/03/22 0452  NA 134* 134*  K 2.4* 3.0*  CL 96* 100  CO2 28 26  GLUCOSE 155* 155*  BUN 5* 6  CREATININE 0.87 0.78  CALCIUM 8.4* 8.0*   LFT Recent Labs    08/02/22 1224 08/03/22 0452  PROT 6.8 5.9*  ALBUMIN 2.6* 2.5*  AST 248* 206*  ALT 77* 61*  ALKPHOS 262* 232*  BILITOT 3.1* 3.3*   PT/INR Recent Labs    08/02/22 1224 08/03/22 0452   LABPROT 20.1* 21.0*  INR 1.7* 1.8*    Studies/Results: US Paracentesis  Result Date: 08/02/2022 Lavonia Dana, MD     08/02/2022  4:52 PM PreOperative Dx: Ascites Postoperative Dx: Ascites Procedure:   US guided paracentesis Radiologist:  Thornton Papas Anesthesia:  10 ml of 1% lidocaine Specimen:  3.5 L of yellow ascitic fluid EBL:   < 1 ml Complications:  None  DG Chest Portable 1 View  Result Date: 08/02/2022 CLINICAL DATA:  Shortness of breath EXAM: PORTABLE CHEST 1 VIEW COMPARISON:  09/11/2017 FINDINGS: The heart size and mediastinal contours are within normal limits. Mild diffuse interstitial opacity. The visualized skeletal structures are unremarkable. IMPRESSION: Mild diffuse bilateral interstitial pulmonary opacity, suggesting edema or atypical/viral infection. No focal airspace opacity. Electronically Signed   By: Delanna Ahmadi M.D.   On: 08/02/2022 14:27   CT ABDOMEN PELVIS W CONTRAST  Result Date: 08/02/2022 CLINICAL DATA:  Abdominal pain, weakness, leg swelling and abdominal swelling for 1 week. EXAM: CT ABDOMEN AND PELVIS WITH CONTRAST TECHNIQUE: Multidetector CT imaging of the abdomen and pelvis was performed using the standard protocol following bolus administration of intravenous contrast. RADIATION DOSE REDUCTION: This exam was performed according to the departmental dose-optimization program which includes automated exposure control, adjustment of the mA and/or kV according to patient size and/or use of iterative reconstruction technique. CONTRAST:  166m OMNIPAQUE IOHEXOL 300  MG/ML  SOLN COMPARISON:  02/21/2014 FINDINGS: Lower chest: No acute abnormality. No pleural fluid or airspace consolidation. Hepatobiliary: There is hypertrophy of the lateral segment of left hepatic lobe and caudate lobe of liver. The contour the liver appears irregular and nodular. Heterogeneous appearance of the liver parenchyma with numerous tiny low-density foci are scattered throughout which measure up to 7 mm.  These are new when compared with 02/21/2014. stones identified within the gallbladder. No significant gallbladder wall thickening or bile duct dilatation. Pancreas: Unremarkable. No pancreatic ductal dilatation or surrounding inflammatory changes. Spleen: Normal in size without focal abnormality. Adrenals/Urinary Tract: Adrenal glands are unremarkable. Kidneys are normal, without renal calculi, focal lesion, or hydronephrosis. Bladder is unremarkable. Stomach/Bowel: Stomach appears normal. No small bowel wall thickening, inflammation or distension. The appendix is visualized and is unremarkable. There is mild edema involving the colon from the cecum to the level of the splenic flexure Vascular/Lymphatic: Aortic atherosclerosis. The portal vein appears patent. Esophageal and gastric varices identified. Recanalization of the umbilical vein. No abdominopelvic adenopathy. Reproductive: Prostate is unremarkable. Other: There is a moderate volume of ascites within the abdomen and pelvis. No focal fluid collections identified. Right inguinal hernia contains fat as well as ascites. Musculoskeletal: Subcutaneous edema is identified along both flanks as well as the proximal portions of the lower extremities. No acute or suspicious osseous findings. IMPRESSION: 1. Morphologic features of the liver compatible with cirrhosis. 2. Stigmata of portal venous hypertension identified including moderate volume of ascites, esophageal and gastric varices and recanalization of the umbilical vein. 3. Heterogeneous appearance of the liver parenchyma with numerous tiny low-density foci scattered throughout which measure up to 7 mm. These are new when compared with 02/21/2014. These are nonspecific and may reflect multiple dysplastic nodules. Consider follow-up imaging with nonemergent MRI of the liver without and with contrast material for further characterization 4. Gallstones. 5. Mild edema involving the colon from the cecum to the level  of the splenic flexure. This is nonspecific and may be related to underlying liver disease. 6. Right inguinal hernia contains fat as well as ascites. 7. Subcutaneous edema is identified along both flanks as well as the proximal portions of the lower extremities. 8.  Aortic Atherosclerosis (ICD10-I70.0). Electronically Signed   By: Kerby Moors M.D.   On: 08/02/2022 13:38    Assessment: 57 y.o. year old male with history of hepatic steatosis, alcohol use, elastography in Sept 2023 with fatty liver, presenting now to the ED with week long history of SOB, weakness, lower extremity edema, and abdominal distension, found to have cirrhosis on CT this admission with moderate volume ascites, esophageal and gastric varices, possibly dysplastic hepatic nodules with recommendations for MRI non-urgently.    Decompensated cirrhosis:  With ascites/anasarca and evidence of esophageal and gastric varices per CT this admission. No  prior EGD.  MELD 3.0 is 20 today, up from 19 yesterday. Paracentesis yesterday yielding 3.5 L.  PMNs less than 250.  Gram stain negative.  Culture and acid-fast smear pending. Tense ascites has improved. He needs to be started on diuretic therapy. Lasix started by hospitalist. Will add Spironolactone as well. Cr is normal. K is still low but improved. Electrolytes will need to be monitored closely. No encephalopathy at this time. He will need close monitoring outpatient as well as outpatient EGD to assess varices.   Alcoholic hepatitis:  DF is 40.1 today, up from 35.8 yesterday using PT control of 13. With elevated DF and no sign of acute infection, prior Hep B,  C serologies negative in Sept 2023, he has been started on prednisolone today. Will need to calculate Lille score on 2/5.   Liver lesions:  New numerous tiny low-density foci scattered throughout liver parenchyma noted on CT this admission which may reflect multiple dysplastic nodules. AFP in process. Will need outpatient MRI for  further evaluation.   Hypokalemia:  Management per hospitalist. Needs close monitoring with starting diuretics today.   Plan: Agree with Lasix 40 mg daily started by hospitalist.  Add spironolactone 100 mg daily. Continue Prednisolone.  Calculate Lille score on 2/5.  Daily CMP and INR. Hypokalemia management per hospitalist. Follow-up on AFP Monitor for encephalopathy.  Strict 2g sodium diet restriction.  Outpatient MRI  Absolute alcohol cessation. Counseled on this.    LOS: 1 day    08/03/2022, 7:51 AM   Aliene Altes, PA-C [facility]

## 2022-08-03 NOTE — Progress Notes (Addendum)
PROGRESS NOTE  Shawn Velazquez RKY:[id number] DOB: 1965/12/27 DOA: 08/02/2022 PCP: Celene Squibb, MD  Brief History:  57 year old male with a history of hypertension, hyperlipidemia, diabetes mellitus type 2, fatty liver, and depression presenting with 2-week history of increasing lower extremity edema and increasing abdominal girth.  The patient states that he was having trouble bending over to get his socks on on the day of admission.  He has had some subjective chills but denies any fever.  He denies any headache, chest pain, cough, hemoptysis, diarrhea, abdominal pain, dysuria, hematuria.  There is no hematochezia or melena.  He has intermittent nausea and vomiting a couple times a week without any blood or coffee grounds.  He has had some dyspnea on exertion.  He denies any orthopnea. The patient has drank alcohol daily for at least 30 years.  He continues to smoke about 1/2 pack/day.  He has approximately 20-pack-year history.  His last drink was in the evening of 08/01/2022. In the ED, the patient was afebrile and hemodynamically stable with oxygen saturation 100% room air.  WBC 9.1, hemoglobin 13.1, platelets 1-58,000.  Sodium 134, potassium 2.4, bicarbonate 20, BUN 5, creatinine 0.7.  AST 248, ALT 77, alkaline phosphatase 262, total bilirubin 3.1.  Lipase 68.  BNP 96.  CT of the abdomen and pelvis showed nodular liver.  There is moderate ascites.  There was gastric and esophageal varices.  There is mild edema of the colon from the cecum to the splenic flexure.  There was subcutaneous edema in the flanks.  The patient was started on IV furosemide.  GI was consulted to assist with management.   Assessment/Plan: Decompensated liver cirrhosis with anasarca -Start IV furosemide -Start spironolactone -Echo--EF 60-65%, no WMA, normal RVF -Urine protein creatinine ratio -Check alpha-fetoprotein -Accurate I's and O's -Daily weights -08/02/2022 CT abd/pelvis -Patient has had a history of  fatty liver noted on imaging--likely ASH and NASH -Previous extensive workup for elevated LFTs>> Acute hepatitis panel negative, alpha-1 antitrypsin level normal, ANA negative, anti-smooth muscle antibody negative, iron/TIBC/ferritin normal, AMA negative, hepatitis B surface antibody nonreactive, immunoglobulins unremarkable, TTG IgA less than 2.  -paracentesis>>neg for SBP (3.5 L removed)   Hypokalemia -Repleted -Magnesium 1.8 -Hold HCTZ   Hypervolemic hyponatremia -Anticipate improvement with IV diuresis   Transaminasemia -Previous extensive workup for elevated LFTs>> Acute hepatitis panel negative, alpha-1 antitrypsin level normal, ANA negative, anti-smooth muscle antibody negative, iron/TIBC/ferritin normal, AMA negative, hepatitis B surface antibody nonreactive, immunoglobulins unremarkable, TTG IgA less than 2.    Controlled diabetes mellitus type 2  -NovoLog sliding scale -Reduced dose Semglee -Hold metformin -1/29 A1C--5.1   Essential hypertension -Holding amlodipine and losartan to allow for blood pressure margin for diuresis   Class I obesity -BMI 34.06 -Lifestyle modification   Elevated Lipase -not clinically significant   Alcohol abuse -CIWA   Anxiety/depression -continue fluoxetine  Liver lesions -incidental on CT abd -MR liver       Family Communication:   spouse updated at bedside 1/30  Consultants:  GI  Code Status:  FULL   DVT Prophylaxis:  [place] Heparin   Procedures: As Listed in Progress Note Above  Antibiotics: None    Subjective: Patient denies fevers, chills, headache, chest pain, dyspnea, nausea, vomiting, diarrhea, abdominal pain, dysuria, hematuria, hematochezia, and melena.   Objective: Vitals:   08/03/22 0426 08/03/22 0522 08/03/22 0942 08/03/22 1426  BP: 116/78 137/77 129/65 124/68  Pulse: 79 75 77 66  Resp:  $'20 18 20  'j$ Temp: 98.5 F (36.9 C) 98.7 F (37.1 C) 98.1 F (36.7 C) 99 F (37.2 C)  TempSrc: Oral  Oral Oral   SpO2: 97% 95% 97% 99%  Weight:      Height:        Intake/Output Summary (Last 24 hours) at 08/03/2022 1658 Last data filed at 08/03/2022 1300 Gross per 24 hour  Intake 720 ml  Output --  Net 720 ml   Weight change:  Exam:  General:  Pt is alert, follows commands appropriately, not in acute distress HEENT: No icterus, No thrush, No neck mass, Riley/AT Cardiovascular: RRR, S1/S2, no rubs, no gallops Respiratory: bibasilar crackles. No wheeze Abdomen: Soft/+BS, non tender, non distended, no guarding Extremities: 2 + LE edema, No lymphangitis, No petechiae, No rashes, no synovitis   Data Reviewed: I have personally reviewed following labs and imaging studies Basic Metabolic Panel: Recent Labs  Lab 08/02/22 1224 08/03/22 0452  NA 134* 134*  K 2.4* 3.0*  CL 96* 100  CO2 28 26  GLUCOSE 155* 155*  BUN 5* 6  CREATININE 0.87 0.78  CALCIUM 8.4* 8.0*  MG 1.8  --    Liver Function Tests: Recent Labs  Lab 08/02/22 1224 08/03/22 0452  AST 248* 206*  ALT 77* 61*  ALKPHOS 262* 232*  BILITOT 3.1* 3.3*  PROT 6.8 5.9*  ALBUMIN 2.6* 2.5*   Recent Labs  Lab 08/02/22 1224  LIPASE 68*   No results for input(s): "AMMONIA" in the last 168 hours. Coagulation Profile: Recent Labs  Lab 08/02/22 1224 08/03/22 0452  INR 1.7* 1.8*   CBC: Recent Labs  Lab 08/02/22 1224 08/03/22 0452  WBC 9.1 9.1  HGB 13.1 11.3*  HCT 35.0* 30.5*  MCV 103.9* 104.1*  PLT 158 111*   Cardiac Enzymes: No results for input(s): "CKTOTAL", "CKMB", "CKMBINDEX", "TROPONINI" in the last 168 hours. BNP: Invalid input(s): "POCBNP" CBG: Recent Labs  Lab 08/02/22 2205 08/03/22 0800 08/03/22 1215 08/03/22 1622  GLUCAP 188* 138* 269* 275*   HbA1C: Recent Labs    08/03/22 0452  HGBA1C 5.1   Urine analysis:    Component Value Date/Time   COLORURINE YELLOW 08/02/2022 1304   APPEARANCEUR CLEAR 08/02/2022 1304   LABSPEC 1.004 (L) 08/02/2022 1304   PHURINE 7.0 08/02/2022 1304   GLUCOSEU  NEGATIVE 08/02/2022 1304   HGBUR NEGATIVE 08/02/2022 1304   BILIRUBINUR NEGATIVE 08/02/2022 1304   BILIRUBINUR negative 07/01/2020 1608   KETONESUR NEGATIVE 08/02/2022 1304   PROTEINUR NEGATIVE 08/02/2022 1304   UROBILINOGEN 0.2 07/01/2020 1608   NITRITE NEGATIVE 08/02/2022 1304   LEUKOCYTESUR NEGATIVE 08/02/2022 1304   Sepsis Labs: '@LABRCNTIP'$ (procalcitonin:4,lacticidven:4) ) Recent Results (from the past 240 hour(s))  Culture, body fluid w Gram Stain-bottle     Status: None (Preliminary result)   Collection Time: 08/02/22  4:03 PM   Specimen: Peritoneal Washings  Result Value Ref Range Status   Specimen Description PERITONEAL  Final   Special Requests   Final    BOTTLES DRAWN AEROBIC AND ANAEROBIC Blood Culture adequate volume Performed at [facility], [address] [place], [place] [address]    Culture PENDING  Incomplete   Report Status PENDING  Incomplete  Gram stain     Status: None   Collection Time: 08/02/22  4:03 PM   Specimen: Peritoneal Washings  Result Value Ref Range Status   Specimen Description PERITONEAL  Final   Special Requests NONE  Final   Gram Stain   Final    NO  ORGANISMS SEEN WBC PRESENT, PREDOMINANTLY MONONUCLEAR CYTOSPIN SMEAR Performed at [facility], [address] [place], [place] [address]    Report Status 08/02/2022 FINAL  Final     Scheduled Meds:  FLUoxetine  40 mg Oral Daily   folic acid  1 mg Oral Daily   furosemide  40 mg Intravenous Daily   heparin  5,000 Units Subcutaneous Q8H   insulin aspart  0-5 Units Subcutaneous QHS   insulin aspart  0-9 Units Subcutaneous TID WC   montelukast  10 mg Oral QHS   multivitamin with minerals  1 tablet Oral Daily   potassium chloride  40 mEq Oral BID   prednisoLONE  40 mg Oral QAC breakfast   spironolactone  100 mg Oral Daily   thiamine  100 mg Oral Daily   Or   thiamine  100 mg Intravenous Daily   Continuous Infusions:  Procedures/Studies: ECHOCARDIOGRAM COMPLETE  Result Date:  08/03/2022    ECHOCARDIOGRAM REPORT   Patient Name:   TAYVEON LOMBARDO Date of Exam: 08/03/2022 Medical Rec #:  [id number]         Height:       66.0 in Accession #:    [id number]        Weight:       199.3 lb Date of Birth:  1965-12-18        BSA:          1.997 m Patient Age:    89 years          BP:           137/77 mmHg Patient Gender: M                 HR:           74 bpm. Exam Location:  [name] Procedure: 2D Echo, 3D Echo, Cardiac Doppler, Color Doppler and Strain Analysis Indications:    Dyspnea R06.00  History:        Patient has no prior history of Echocardiogram examinations.                 Signs/Symptoms:Dyspnea and Edema; Risk Factors:Hypertension,                 Diabetes, Dyslipidemia, Former Smoker and Sleep Apnea.  Sonographer:    Greer Pickerel Referring Phys: [id number] Jailee Jaquez  Sonographer Comments: Image acquisition challenging due to patient body habitus and Image acquisition challenging due to respiratory motion. Global longitudinal strain was attempted. IMPRESSIONS  1. Left ventricular ejection fraction, by estimation, is 60 to 65%. The left ventricle has normal function. The left ventricle has no regional wall motion abnormalities. There is mild left ventricular hypertrophy. Left ventricular diastolic parameters were normal. The average left ventricular global longitudinal strain is -21.1 %. The global longitudinal strain is normal.  2. Right ventricular systolic function was not well visualized. The right ventricular size is normal.  3. Left atrial size was moderately dilated.  4. The mitral valve is grossly normal. No evidence of mitral valve regurgitation. No evidence of mitral stenosis.  5. The aortic valve was not well visualized. There is moderate calcification of the aortic valve. Aortic valve regurgitation is not visualized. No aortic stenosis is present. Comparison(s): No prior Echocardiogram. FINDINGS  Left Ventricle: Left ventricular ejection fraction, by estimation, is 60 to 65%.  The left ventricle has normal function. The left ventricle has no regional wall motion abnormalities. The average left ventricular global longitudinal strain is -21.1 %. The  global longitudinal strain is normal. The left ventricular internal cavity size was normal in size. There is mild left ventricular hypertrophy. Left ventricular diastolic parameters were normal. Right Ventricle: The right ventricular size is normal. No increase in right ventricular wall thickness. Right ventricular systolic function was not well visualized. Left Atrium: Left atrial size was moderately dilated. Right Atrium: Right atrial size was normal in size. Pericardium: There is no evidence of pericardial effusion. Mitral Valve: The mitral valve is grossly normal. No evidence of mitral valve regurgitation. No evidence of mitral valve stenosis. Tricuspid Valve: The tricuspid valve is grossly normal. Tricuspid valve regurgitation is not demonstrated. No evidence of tricuspid stenosis. Aortic Valve: The aortic valve was not well visualized. There is moderate calcification of the aortic valve. Aortic valve regurgitation is not visualized. No aortic stenosis is present. Pulmonic Valve: The pulmonic valve was not well visualized. Pulmonic valve regurgitation is not visualized. No evidence of pulmonic stenosis. Aorta: The aortic root and ascending aorta are structurally normal, with no evidence of dilitation. Venous: The inferior vena cava was not well visualized. IAS/Shunts: The interatrial septum was not well visualized.  LEFT VENTRICLE PLAX 2D LVIDd:         4.60 cm   Diastology LVIDs:         2.90 cm   LV e' medial:    7.77 cm/s LV PW:         1.20 cm   LV E/e' medial:  12.5 LV IVS:        0.90 cm   LV e' lateral:   11.70 cm/s LVOT diam:     2.10 cm   LV E/e' lateral: 8.3 LV SV:         70 LV SV Index:   35        2D Longitudinal Strain LVOT Area:     3.46 cm  2D Strain GLS Avg:     -21.1 %                           3D Volume EF:                           3D EF:        56 %                          LV EDV:       221 ml                          LV ESV:       97 ml                          LV SV:        124 ml RIGHT VENTRICLE RV S prime:     13.50 cm/s TAPSE (M-mode): 1.8 cm LEFT ATRIUM             Index        RIGHT ATRIUM           Index LA diam:        3.80 cm 1.90 cm/m   RA Area:     12.50 cm LA Vol (A2C):   74.4 ml 37.26 ml/m  RA Volume:   21.10 ml  10.57 ml/m LA Vol (A4C):  78.7 ml 39.41 ml/m LA Biplane Vol: 81.5 ml 40.82 ml/m  AORTIC VALVE LVOT Vmax:   106.00 cm/s LVOT Vmean:  68.400 cm/s LVOT VTI:    0.203 m  AORTA Ao Root diam: 3.40 cm Ao Asc diam:  3.00 cm MITRAL VALVE               TRICUSPID VALVE MV Area (PHT): 4.31 cm    TR Peak grad:   4.7 mmHg MV Decel Time: 176 msec    TR Vmax:        108.00 cm/s MV E velocity: 97.40 cm/s MV A velocity: 81.50 cm/s  SHUNTS MV E/A ratio:  1.20        Systemic VTI:  0.20 m                            Systemic Diam: 2.10 cm Vishnu Priya Mallipeddi Electronically signed by Lorelee Cover Mallipeddi Signature Date/Time: 08/03/2022/1:10:55 PM    Final    US Paracentesis  Result Date: 08/02/2022 INDICATION: Ascites EXAM: ULTRASOUND GUIDED DIAGNOSTIC AND THERAPEUTIC PARACENTESIS MEDICATIONS: None. COMPLICATIONS: None immediate. PROCEDURE: Informed written consent was obtained from the patient after a discussion of the risks, benefits and alternatives to treatment. A timeout was performed prior to the initiation of the procedure. Initial ultrasound scanning demonstrates a large amount of ascites within the right lower abdominal quadrant. The right lower abdomen was prepped and draped in the usual sterile fashion. 1% lidocaine was used for local anesthesia. Following this, a 19 gauge, 7-cm, Yueh catheter was introduced. An ultrasound image was saved for documentation purposes. The paracentesis was performed. The catheter was removed and a dressing was applied. The patient tolerated the procedure well without  immediate post procedural complication. Patient received post-procedure intravenous albumin; see nursing notes for details. FINDINGS: A total of approximately 3.5 L of yellow ascitic fluid was removed. Samples were sent to the laboratory as requested by the clinical team. IMPRESSION: Successful ultrasound-guided paracentesis yielding 3.5 liters of peritoneal fluid. Electronically Signed   By: Lavonia Dana M.D.   On: 08/02/2022 16:52   DG Chest Portable 1 View  Result Date: 08/02/2022 CLINICAL DATA:  Shortness of breath EXAM: PORTABLE CHEST 1 VIEW COMPARISON:  09/11/2017 FINDINGS: The heart size and mediastinal contours are within normal limits. Mild diffuse interstitial opacity. The visualized skeletal structures are unremarkable. IMPRESSION: Mild diffuse bilateral interstitial pulmonary opacity, suggesting edema or atypical/viral infection. No focal airspace opacity. Electronically Signed   By: Delanna Ahmadi M.D.   On: 08/02/2022 14:27   CT ABDOMEN PELVIS W CONTRAST  Result Date: 08/02/2022 CLINICAL DATA:  Abdominal pain, weakness, leg swelling and abdominal swelling for 1 week. EXAM: CT ABDOMEN AND PELVIS WITH CONTRAST TECHNIQUE: Multidetector CT imaging of the abdomen and pelvis was performed using the standard protocol following bolus administration of intravenous contrast. RADIATION DOSE REDUCTION: This exam was performed according to the departmental dose-optimization program which includes automated exposure control, adjustment of the mA and/or kV according to patient size and/or use of iterative reconstruction technique. CONTRAST:  157m OMNIPAQUE IOHEXOL 300 MG/ML  SOLN COMPARISON:  02/21/2014 FINDINGS: Lower chest: No acute abnormality. No pleural fluid or airspace consolidation. Hepatobiliary: There is hypertrophy of the lateral segment of left hepatic lobe and caudate lobe of liver. The contour the liver appears irregular and nodular. Heterogeneous appearance of the liver parenchyma with numerous  tiny low-density foci are scattered throughout which measure up to 7 mm. These are  new when compared with 02/21/2014. stones identified within the gallbladder. No significant gallbladder wall thickening or bile duct dilatation. Pancreas: Unremarkable. No pancreatic ductal dilatation or surrounding inflammatory changes. Spleen: Normal in size without focal abnormality. Adrenals/Urinary Tract: Adrenal glands are unremarkable. Kidneys are normal, without renal calculi, focal lesion, or hydronephrosis. Bladder is unremarkable. Stomach/Bowel: Stomach appears normal. No small bowel wall thickening, inflammation or distension. The appendix is visualized and is unremarkable. There is mild edema involving the colon from the cecum to the level of the splenic flexure Vascular/Lymphatic: Aortic atherosclerosis. The portal vein appears patent. Esophageal and gastric varices identified. Recanalization of the umbilical vein. No abdominopelvic adenopathy. Reproductive: Prostate is unremarkable. Other: There is a moderate volume of ascites within the abdomen and pelvis. No focal fluid collections identified. Right inguinal hernia contains fat as well as ascites. Musculoskeletal: Subcutaneous edema is identified along both flanks as well as the proximal portions of the lower extremities. No acute or suspicious osseous findings. IMPRESSION: 1. Morphologic features of the liver compatible with cirrhosis. 2. Stigmata of portal venous hypertension identified including moderate volume of ascites, esophageal and gastric varices and recanalization of the umbilical vein. 3. Heterogeneous appearance of the liver parenchyma with numerous tiny low-density foci scattered throughout which measure up to 7 mm. These are new when compared with 02/21/2014. These are nonspecific and may reflect multiple dysplastic nodules. Consider follow-up imaging with nonemergent MRI of the liver without and with contrast material for further characterization 4.  Gallstones. 5. Mild edema involving the colon from the cecum to the level of the splenic flexure. This is nonspecific and may be related to underlying liver disease. 6. Right inguinal hernia contains fat as well as ascites. 7. Subcutaneous edema is identified along both flanks as well as the proximal portions of the lower extremities. 8.  Aortic Atherosclerosis (ICD10-I70.0). Electronically Signed   By: Kerby Moors M.D.   On: 08/02/2022 13:38    Orson Eva, DO  [facility]  If 7PM-7AM, please contact night-coverage [email or web address] Password [email or web address] 08/03/2022, 4:58 PM   LOS: 1 day

## 2022-08-03 NOTE — Progress Notes (Signed)
  Echocardiogram 2D Echocardiogram has been performed.  Shawn Velazquez 08/03/2022, 9:27 AM

## 2022-08-03 NOTE — TOC Progression Note (Signed)
  Transition of Care [facility]) Screening Note   Patient Details  Name: Shawn Velazquez Date of Birth: 09-02-1965   Transition of Care [facility]) CM/SW Contact:    Shade Flood, LCSW Phone Number: 08/03/2022, 10:15 AM    SA treatment resource lists added to pt's AVS.  Transition of Care Department [facility]) has reviewed patient and no TOC needs have been identified at this time. We will continue to monitor patient advancement through interdisciplinary progression rounds. If new patient transition needs arise, please place a TOC consult.

## 2022-08-03 NOTE — Progress Notes (Signed)
PROGRESS NOTE  CAPTAIN BLUCHER YOV:[id number] DOB: [birth year] DOA: 08/02/2022 PCP: Celene Squibb, MD  Brief History:  57 year old male with a history of hypertension, hyperlipidemia, diabetes mellitus type 2, fatty liver, and depression presenting with 2-week history of increasing lower extremity edema and increasing abdominal girth.  The patient states that he was having trouble bending over to get his socks on on the day of admission.  He has had some subjective chills but denies any fever.  He denies any headache, chest pain, cough, hemoptysis, diarrhea, abdominal pain, dysuria, hematuria.  There is no hematochezia or melena.  He has intermittent nausea and vomiting a couple times a week without any blood or coffee grounds.  He has had some dyspnea on exertion.  He denies any orthopnea. The patient has drank alcohol daily for at least 30 years.  He continues to smoke about 1/2 pack/day.  He has approximately 20-pack-year history.  His last drink was in the evening of 08/01/2022. In the ED, the patient was afebrile and hemodynamically stable with oxygen saturation 100% room air.  WBC 9.1, hemoglobin 13.1, platelets 1-58,000.  Sodium 134, potassium 2.4, bicarbonate 20, BUN 5, creatinine 0.7.  AST 248, ALT 77, alkaline phosphatase 262, total bilirubin 3.1.  Lipase 68.  BNP 96.  CT of the abdomen and pelvis showed nodular liver.  There is moderate ascites.  There was gastric and esophageal varices.  There is mild edema of the colon from the cecum to the splenic flexure.  There was subcutaneous edema in the flanks.  The patient was started on IV furosemide.  GI was consulted to assist with management.   Assessment/Plan: Decompensated liver cirrhosis with anasarca -Start IV furosemide -Start spironolactone -Echocardiogram -Urine protein creatinine ratio -Check alpha-fetoprotein -Accurate I's and O's -Daily weights -08/02/2022 CT abd/pelvis -Patient has had a history of fatty liver noted on  imaging--likely ASH and NASH -Previous extensive workup for elevated LFTs>> Acute hepatitis panel negative, alpha-1 antitrypsin level normal, ANA negative, anti-smooth muscle antibody negative, iron/TIBC/ferritin normal, AMA negative, hepatitis B surface antibody nonreactive, immunoglobulins unremarkable, TTG IgA less than 2.  -paracentesis>>send for SAAG   Hypokalemia -Replete -Magnesium 1.8 -Hold HCTZ   Hypervolemic hyponatremia -Anticipate improvement with IV diuresis   Transaminasemia -Previous extensive workup for elevated LFTs>> Acute hepatitis panel negative, alpha-1 antitrypsin level normal, ANA negative, anti-smooth muscle antibody negative, iron/TIBC/ferritin normal, AMA negative, hepatitis B surface antibody nonreactive, immunoglobulins unremarkable, TTG IgA less than 2.    Controlled diabetes mellitus type 2  -NovoLog sliding scale -Reduced dose Semglee -Hold metformin   Essential hypertension -Holding amlodipine and losartan to allow for blood pressure margin for diuresis   Class I obesity -BMI 34.06 -Lifestyle modification   Elevated Lipase -not clinically significant   Alcohol abuse -CIWA   Anxiety/depression -continue fluoxetine      Subjective:   Objective: Vitals:   08/03/22 0426 08/03/22 0522 08/03/22 0942 08/03/22 1426  BP: 116/78 137/77 129/65 124/68  Pulse: 79 75 77 66  Resp:  '20 18 20  '$ Temp: 98.5 F (36.9 C) 98.7 F (37.1 C) 98.1 F (36.7 C) 99 F (37.2 C)  TempSrc: Oral  Oral Oral  SpO2: 97% 95% 97% 99%  Weight:      Height:        Intake/Output Summary (Last 24 hours) at 08/03/2022 1524 Last data filed at 08/03/2022 1300 Gross per 24 hour  Intake 720 ml  Output --  Net 720 ml  Weight change:  Exam:  General:  Pt is alert, follows commands appropriately, not in acute distress HEENT: No icterus, No thrush, No neck mass, [place]/AT Cardiovascular: RRR, S1/S2, no rubs, no gallops Respiratory: CTA bilaterally, no wheezing, no  crackles, no rhonchi Abdomen: Soft/+BS, non tender, non distended, no guarding Extremities: No edema, No lymphangitis, No petechiae, No rashes, no synovitis   Data Reviewed: I have personally reviewed following labs and imaging studies Basic Metabolic Panel: Recent Labs  Lab 08/02/22 1224 08/03/22 0452  NA 134* 134*  K 2.4* 3.0*  CL 96* 100  CO2 28 26  GLUCOSE 155* 155*  BUN 5* 6  CREATININE 0.87 0.78  CALCIUM 8.4* 8.0*  MG 1.8  --    Liver Function Tests: Recent Labs  Lab 08/02/22 1224 08/03/22 0452  AST 248* 206*  ALT 77* 61*  ALKPHOS 262* 232*  BILITOT 3.1* 3.3*  PROT 6.8 5.9*  ALBUMIN 2.6* 2.5*   Recent Labs  Lab 08/02/22 1224  LIPASE 68*   No results for input(s): "AMMONIA" in the last 168 hours. Coagulation Profile: Recent Labs  Lab 08/02/22 1224 08/03/22 0452  INR 1.7* 1.8*   CBC: Recent Labs  Lab 08/02/22 1224 08/03/22 0452  WBC 9.1 9.1  HGB 13.1 11.3*  HCT 35.0* 30.5*  MCV 103.9* 104.1*  PLT 158 111*   Cardiac Enzymes: No results for input(s): "CKTOTAL", "CKMB", "CKMBINDEX", "TROPONINI" in the last 168 hours. BNP: Invalid input(s): "POCBNP" CBG: Recent Labs  Lab 08/02/22 2205 08/03/22 0800 08/03/22 1215  GLUCAP 188* 138* 269*   HbA1C: Recent Labs    08/03/22 0452  HGBA1C 5.1   Urine analysis:    Component Value Date/Time   COLORURINE YELLOW 08/02/2022 1304   APPEARANCEUR CLEAR 08/02/2022 1304   LABSPEC 1.004 (L) 08/02/2022 1304   PHURINE 7.0 08/02/2022 1304   GLUCOSEU NEGATIVE 08/02/2022 1304   HGBUR NEGATIVE 08/02/2022 1304   BILIRUBINUR NEGATIVE 08/02/2022 1304   BILIRUBINUR negative 07/01/2020 1608   KETONESUR NEGATIVE 08/02/2022 1304   PROTEINUR NEGATIVE 08/02/2022 1304   UROBILINOGEN 0.2 07/01/2020 1608   NITRITE NEGATIVE 08/02/2022 1304   LEUKOCYTESUR NEGATIVE 08/02/2022 1304   Sepsis Labs: '@LABRCNTIP'$ (procalcitonin:4,lacticidven:4) ) Recent Results (from the past 240 hour(s))  Culture, body fluid w Gram  Stain-bottle     Status: None (Preliminary result)   Collection Time: 08/02/22  4:03 PM   Specimen: Peritoneal Washings  Result Value Ref Range Status   Specimen Description PERITONEAL  Final   Special Requests   Final    BOTTLES DRAWN AEROBIC AND ANAEROBIC Blood Culture adequate volume Performed at [facility], [address] [place], [place] [address]    Culture PENDING  Incomplete   Report Status PENDING  Incomplete  Gram stain     Status: None   Collection Time: 08/02/22  4:03 PM   Specimen: Peritoneal Washings  Result Value Ref Range Status   Specimen Description PERITONEAL  Final   Special Requests NONE  Final   Gram Stain   Final    NO ORGANISMS SEEN WBC PRESENT, PREDOMINANTLY MONONUCLEAR CYTOSPIN SMEAR Performed at [facility], [address], [place], [place] [address]    Report Status 08/02/2022 FINAL  Final     Scheduled Meds:  FLUoxetine  40 mg Oral Daily   folic acid  1 mg Oral Daily   furosemide  40 mg Intravenous Daily   heparin  5,000 Units Subcutaneous Q8H   insulin aspart  0-5 Units Subcutaneous QHS   insulin aspart  0-9 Units Subcutaneous TID WC   montelukast  10 mg Oral QHS   multivitamin with minerals  1 tablet Oral Daily   potassium chloride  40 mEq Oral BID   prednisoLONE  40 mg Oral QAC breakfast   spironolactone  100 mg Oral Daily   thiamine  100 mg Oral Daily   Or   thiamine  100 mg Intravenous Daily   Continuous Infusions:  Procedures/Studies: ECHOCARDIOGRAM COMPLETE  Result Date: 08/03/2022    ECHOCARDIOGRAM REPORT   Patient Name:   JAHKARI MACLIN Date of Exam: 08/03/2022 Medical Rec #:  [id number]         Height:       66.0 in Accession #:    [id number]        Weight:       199.3 lb Date of Birth:  [birth year]        BSA:          1.997 m Patient Age:    47 years          BP:           137/77 mmHg Patient Gender: M                 HR:           74 bpm. Exam Location:  [name] Procedure: 2D Echo, 3D Echo, Cardiac Doppler, Color Doppler and  Strain Analysis Indications:    Dyspnea R06.00  History:        Patient has no prior history of Echocardiogram examinations.                 Signs/Symptoms:Dyspnea and Edema; Risk Factors:Hypertension,                 Diabetes, Dyslipidemia, Former Smoker and Sleep Apnea.  Sonographer:    Greer Pickerel Referring Phys: [phone] Charlann Wayne  Sonographer Comments: Image acquisition challenging due to patient body habitus and Image acquisition challenging due to respiratory motion. Global longitudinal strain was attempted. IMPRESSIONS  1. Left ventricular ejection fraction, by estimation, is 60 to 65%. The left ventricle has normal function. The left ventricle has no regional wall motion abnormalities. There is mild left ventricular hypertrophy. Left ventricular diastolic parameters were normal. The average left ventricular global longitudinal strain is -21.1 %. The global longitudinal strain is normal.  2. Right ventricular systolic function was not well visualized. The right ventricular size is normal.  3. Left atrial size was moderately dilated.  4. The mitral valve is grossly normal. No evidence of mitral valve regurgitation. No evidence of mitral stenosis.  5. The aortic valve was not well visualized. There is moderate calcification of the aortic valve. Aortic valve regurgitation is not visualized. No aortic stenosis is present. Comparison(s): No prior Echocardiogram. FINDINGS  Left Ventricle: Left ventricular ejection fraction, by estimation, is 60 to 65%. The left ventricle has normal function. The left ventricle has no regional wall motion abnormalities. The average left ventricular global longitudinal strain is -21.1 %. The global longitudinal strain is normal. The left ventricular internal cavity size was normal in size. There is mild left ventricular hypertrophy. Left ventricular diastolic parameters were normal. Right Ventricle: The right ventricular size is normal. No increase in right ventricular wall thickness.  Right ventricular systolic function was not well visualized. Left Atrium: Left atrial size was moderately dilated. Right Atrium: Right atrial size was normal in size. Pericardium: There is no evidence of pericardial effusion. Mitral Valve: The mitral valve  is grossly normal. No evidence of mitral valve regurgitation. No evidence of mitral valve stenosis. Tricuspid Valve: The tricuspid valve is grossly normal. Tricuspid valve regurgitation is not demonstrated. No evidence of tricuspid stenosis. Aortic Valve: The aortic valve was not well visualized. There is moderate calcification of the aortic valve. Aortic valve regurgitation is not visualized. No aortic stenosis is present. Pulmonic Valve: The pulmonic valve was not well visualized. Pulmonic valve regurgitation is not visualized. No evidence of pulmonic stenosis. Aorta: The aortic root and ascending aorta are structurally normal, with no evidence of dilitation. Venous: The inferior vena cava was not well visualized. IAS/Shunts: The interatrial septum was not well visualized.  LEFT VENTRICLE PLAX 2D LVIDd:         4.60 cm   Diastology LVIDs:         2.90 cm   LV e' medial:    7.77 cm/s LV PW:         1.20 cm   LV E/e' medial:  12.5 LV IVS:        0.90 cm   LV e' lateral:   11.70 cm/s LVOT diam:     2.10 cm   LV E/e' lateral: 8.3 LV SV:         70 LV SV Index:   35        2D Longitudinal Strain LVOT Area:     3.46 cm  2D Strain GLS Avg:     -21.1 %                           3D Volume EF:                          3D EF:        56 %                          LV EDV:       221 ml                          LV ESV:       97 ml                          LV SV:        124 ml RIGHT VENTRICLE RV S prime:     13.50 cm/s TAPSE (M-mode): 1.8 cm LEFT ATRIUM             Index        RIGHT ATRIUM           Index LA diam:        3.80 cm 1.90 cm/m   RA Area:     12.50 cm LA Vol (A2C):   74.4 ml 37.26 ml/m  RA Volume:   21.10 ml  10.57 ml/m LA Vol (A4C):   78.7 ml 39.41 ml/m LA  Biplane Vol: 81.5 ml 40.82 ml/m  AORTIC VALVE LVOT Vmax:   106.00 cm/s LVOT Vmean:  68.400 cm/s LVOT VTI:    0.203 m  AORTA Ao Root diam: 3.40 cm Ao Asc diam:  3.00 cm MITRAL VALVE               TRICUSPID VALVE MV Area (PHT): 4.31 cm    TR Peak grad:   4.7 mmHg  MV Decel Time: 176 msec    TR Vmax:        108.00 cm/s MV E velocity: 97.40 cm/s MV A velocity: 81.50 cm/s  SHUNTS MV E/A ratio:  1.20        Systemic VTI:  0.20 m                            Systemic Diam: 2.10 cm Vishnu Priya Mallipeddi Electronically signed by Lorelee Cover Mallipeddi Signature Date/Time: 08/03/2022/1:10:55 PM    Final    US Paracentesis  Result Date: 08/02/2022 INDICATION: Ascites EXAM: ULTRASOUND GUIDED DIAGNOSTIC AND THERAPEUTIC PARACENTESIS MEDICATIONS: None. COMPLICATIONS: None immediate. PROCEDURE: Informed written consent was obtained from the patient after a discussion of the risks, benefits and alternatives to treatment. A timeout was performed prior to the initiation of the procedure. Initial ultrasound scanning demonstrates a large amount of ascites within the right lower abdominal quadrant. The right lower abdomen was prepped and draped in the usual sterile fashion. 1% lidocaine was used for local anesthesia. Following this, a 19 gauge, 7-cm, Yueh catheter was introduced. An ultrasound image was saved for documentation purposes. The paracentesis was performed. The catheter was removed and a dressing was applied. The patient tolerated the procedure well without immediate post procedural complication. Patient received post-procedure intravenous albumin; see nursing notes for details. FINDINGS: A total of approximately 3.5 L of yellow ascitic fluid was removed. Samples were sent to the laboratory as requested by the clinical team. IMPRESSION: Successful ultrasound-guided paracentesis yielding 3.5 liters of peritoneal fluid. Electronically Signed   By: Lavonia Dana M.D.   On: 08/02/2022 16:52   DG Chest Portable 1  View  Result Date: 08/02/2022 CLINICAL DATA:  Shortness of breath EXAM: PORTABLE CHEST 1 VIEW COMPARISON:  09/11/2017 FINDINGS: The heart size and mediastinal contours are within normal limits. Mild diffuse interstitial opacity. The visualized skeletal structures are unremarkable. IMPRESSION: Mild diffuse bilateral interstitial pulmonary opacity, suggesting edema or atypical/viral infection. No focal airspace opacity. Electronically Signed   By: Delanna Ahmadi M.D.   On: 08/02/2022 14:27   CT ABDOMEN PELVIS W CONTRAST  Result Date: 08/02/2022 CLINICAL DATA:  Abdominal pain, weakness, leg swelling and abdominal swelling for 1 week. EXAM: CT ABDOMEN AND PELVIS WITH CONTRAST TECHNIQUE: Multidetector CT imaging of the abdomen and pelvis was performed using the standard protocol following bolus administration of intravenous contrast. RADIATION DOSE REDUCTION: This exam was performed according to the departmental dose-optimization program which includes automated exposure control, adjustment of the mA and/or kV according to patient size and/or use of iterative reconstruction technique. CONTRAST:  140m OMNIPAQUE IOHEXOL 300 MG/ML  SOLN COMPARISON:  02/21/2014 FINDINGS: Lower chest: No acute abnormality. No pleural fluid or airspace consolidation. Hepatobiliary: There is hypertrophy of the lateral segment of left hepatic lobe and caudate lobe of liver. The contour the liver appears irregular and nodular. Heterogeneous appearance of the liver parenchyma with numerous tiny low-density foci are scattered throughout which measure up to 7 mm. These are new when compared with 02/21/2014. stones identified within the gallbladder. No significant gallbladder wall thickening or bile duct dilatation. Pancreas: Unremarkable. No pancreatic ductal dilatation or surrounding inflammatory changes. Spleen: Normal in size without focal abnormality. Adrenals/Urinary Tract: Adrenal glands are unremarkable. Kidneys are normal, without  renal calculi, focal lesion, or hydronephrosis. Bladder is unremarkable. Stomach/Bowel: Stomach appears normal. No small bowel wall thickening, inflammation or distension. The appendix is visualized and is unremarkable. There is mild edema  involving the colon from the cecum to the level of the splenic flexure Vascular/Lymphatic: Aortic atherosclerosis. The portal vein appears patent. Esophageal and gastric varices identified. Recanalization of the umbilical vein. No abdominopelvic adenopathy. Reproductive: Prostate is unremarkable. Other: There is a moderate volume of ascites within the abdomen and pelvis. No focal fluid collections identified. Right inguinal hernia contains fat as well as ascites. Musculoskeletal: Subcutaneous edema is identified along both flanks as well as the proximal portions of the lower extremities. No acute or suspicious osseous findings. IMPRESSION: 1. Morphologic features of the liver compatible with cirrhosis. 2. Stigmata of portal venous hypertension identified including moderate volume of ascites, esophageal and gastric varices and recanalization of the umbilical vein. 3. Heterogeneous appearance of the liver parenchyma with numerous tiny low-density foci scattered throughout which measure up to 7 mm. These are new when compared with 02/21/2014. These are nonspecific and may reflect multiple dysplastic nodules. Consider follow-up imaging with nonemergent MRI of the liver without and with contrast material for further characterization 4. Gallstones. 5. Mild edema involving the colon from the cecum to the level of the splenic flexure. This is nonspecific and may be related to underlying liver disease. 6. Right inguinal hernia contains fat as well as ascites. 7. Subcutaneous edema is identified along both flanks as well as the proximal portions of the lower extremities. 8.  Aortic Atherosclerosis (ICD10-I70.0). Electronically Signed   By: Kerby Moors M.D.   On: 08/02/2022 13:38     Orson Eva, DO  [facility]  If 7PM-7AM, please contact night-coverage [email or web address] Password [email or web address] 08/03/2022, 3:24 PM   LOS: 1 day

## 2022-08-04 DIAGNOSIS — E876 Hypokalemia: Secondary | ICD-10-CM | POA: Diagnosis not present

## 2022-08-04 DIAGNOSIS — R7989 Other specified abnormal findings of blood chemistry: Secondary | ICD-10-CM

## 2022-08-04 DIAGNOSIS — K7031 Alcoholic cirrhosis of liver with ascites: Secondary | ICD-10-CM | POA: Diagnosis not present

## 2022-08-04 DIAGNOSIS — R601 Generalized edema: Secondary | ICD-10-CM | POA: Diagnosis not present

## 2022-08-04 DIAGNOSIS — T380X5A Adverse effect of glucocorticoids and synthetic analogues, initial encounter: Secondary | ICD-10-CM

## 2022-08-04 DIAGNOSIS — R739 Hyperglycemia, unspecified: Secondary | ICD-10-CM

## 2022-08-04 DIAGNOSIS — K7011 Alcoholic hepatitis with ascites: Secondary | ICD-10-CM

## 2022-08-04 LAB — CBC
HCT: 29.2 % — ABNORMAL LOW (ref 39.0–52.0)
Hemoglobin: 10.6 g/dL — ABNORMAL LOW (ref 13.0–17.0)
MCH: 38.1 pg — ABNORMAL HIGH (ref 26.0–34.0)
MCHC: 36.3 g/dL — ABNORMAL HIGH (ref 30.0–36.0)
MCV: 105 fL — ABNORMAL HIGH (ref 80.0–100.0)
Platelets: 97 10*3/uL — ABNORMAL LOW (ref 150–400)
RBC: 2.78 MIL/uL — ABNORMAL LOW (ref 4.22–5.81)
RDW: 13.1 % (ref 11.5–15.5)
WBC: 11.9 10*3/uL — ABNORMAL HIGH (ref 4.0–10.5)
nRBC: 0 % (ref 0.0–0.2)

## 2022-08-04 LAB — COMPREHENSIVE METABOLIC PANEL
ALT: 52 U/L — ABNORMAL HIGH (ref 0–44)
AST: 142 U/L — ABNORMAL HIGH (ref 15–41)
Albumin: 2.4 g/dL — ABNORMAL LOW (ref 3.5–5.0)
Alkaline Phosphatase: 221 U/L — ABNORMAL HIGH (ref 38–126)
Anion gap: 7 (ref 5–15)
BUN: 10 mg/dL (ref 6–20)
CO2: 26 mmol/L (ref 22–32)
Calcium: 7.9 mg/dL — ABNORMAL LOW (ref 8.9–10.3)
Chloride: 98 mmol/L (ref 98–111)
Creatinine, Ser: 0.76 mg/dL (ref 0.61–1.24)
GFR, Estimated: >60 mL/min (ref >60)
Glucose, Bld: 253 mg/dL — ABNORMAL HIGH (ref 70–99)
Potassium: 3.3 mmol/L — ABNORMAL LOW (ref 3.5–5.1)
Sodium: 131 mmol/L — ABNORMAL LOW (ref 135–145)
Total Bilirubin: 3 mg/dL — ABNORMAL HIGH (ref 0.3–1.2)
Total Protein: 5.7 g/dL — ABNORMAL LOW (ref 6.5–8.1)

## 2022-08-04 LAB — GLUCOSE, CAPILLARY
Glucose-Capillary: 231 mg/dL — ABNORMAL HIGH (ref 70–99)
Glucose-Capillary: 262 mg/dL — ABNORMAL HIGH (ref 70–99)
Glucose-Capillary: 341 mg/dL — ABNORMAL HIGH (ref 70–99)
Glucose-Capillary: 304 mg/dL — ABNORMAL HIGH (ref 70–99)

## 2022-08-04 LAB — PROTIME-INR
INR: 1.7 — ABNORMAL HIGH (ref 0.8–1.2)
Prothrombin Time: 20.1 seconds — ABNORMAL HIGH (ref 11.4–15.2)

## 2022-08-04 LAB — AFP TUMOR MARKER: AFP, Serum, Tumor Marker: 3.9 ng/mL (ref 0.0–8.4)

## 2022-08-04 LAB — CYTOLOGY - NON PAP

## 2022-08-04 LAB — ACID FAST SMEAR (AFB, MYCOBACTERIA): Acid Fast Smear: NEGATIVE

## 2022-08-04 LAB — MAGNESIUM: Magnesium: 1.5 mg/dL — ABNORMAL LOW (ref 1.7–2.4)

## 2022-08-04 MED ORDER — INSULIN GLARGINE-YFGN 100 UNIT/ML ~~LOC~~ SOLN
12.0000 [IU] | Freq: Every day | SUBCUTANEOUS | Status: DC
  Administered 2022-08-04 – 2022-08-05 (×2): 12 [IU] via SUBCUTANEOUS
  Filled 2022-08-04 (×4): qty 0.12

## 2022-08-04 MED ORDER — MAGNESIUM SULFATE 4 GM/100ML IV SOLN
4.0000 g | Freq: Once | INTRAVENOUS | Status: AC
  Administered 2022-08-04: 4 g via INTRAVENOUS
  Filled 2022-08-04: qty 100

## 2022-08-04 MED ORDER — INSULIN ASPART 100 UNIT/ML IJ SOLN
4.0000 [IU] | Freq: Three times a day (TID) | INTRAMUSCULAR | Status: DC
  Administered 2022-08-04 – 2022-08-05 (×4): 4 [IU] via SUBCUTANEOUS

## 2022-08-04 NOTE — Inpatient Diabetes Management (Signed)
Inpatient Diabetes Program Recommendations  AACE/ADA: New Consensus Statement on Inpatient Glycemic Control (2015)  Target Ranges:  Prepandial:   less than 140 mg/dL      Peak postprandial:   less than 180 mg/dL (1-2 hours)      Critically ill patients:  140 - 180 mg/dL   Lab Results  Component Value Date   GLUCAP 231 (H) 08/04/2022   HGBA1C 5.1 08/03/2022    Latest Reference Range & Units 08/03/22 08:00 08/03/22 12:15 08/03/22 16:22 08/03/22 21:10 08/04/22 07:23  Glucose-Capillary 70 - 99 mg/dL 138 (H) 269 (H) 275 (H) 335 (H) 231 (H)  (H): Data is abnormally high  Diabetes history: DM2 Outpatient Diabetes medications: Lantus 35 units qd, Humalog 8-14 units tid, Metformin 500 mg bid Current orders for Inpatient glycemic control: Novolog 0-9 units tid , 0-5 units hs correction, Prednisone 40 mg qd  Inpatient Diabetes Program Recommendations:   Please consider: -Semglee 10 units qd -Novolog 3 units tid meal coverage if eats 50% -Add carb modified to current diet order  Thank you, Bethena Roys E. Alishia Lebo, RN, MSN, CDE  Diabetes Coordinator Inpatient Glycemic Control Team Team Pager [phone] (8am-5pm) 08/04/2022 8:22 AM

## 2022-08-04 NOTE — Progress Notes (Signed)
Subjective: Patient reports he is feeling okay, just tired. Denies abdominal pain, nausea or vomiting. Tolerated breakfast this morning. Last BM was yesterday. Feels abdominal swelling has gone down some, continues with some abdominal and LE edema. No episodes of confusion.   Objective: Vital signs in last 24 hours: Temp:  [98 F (36.7 C)-99 F (37.2 C)] 98.2 F (36.8 C) (01/31 0902) Pulse Rate:  [66-89] 85 (01/31 0902) Resp:  [18-20] 18 (01/31 0902) BP: (111-131)/(63-73) 121/64 (01/31 0902) SpO2:  [94 %-99 %] 97 % (01/31 0902) Weight:  [91.6 kg] 91.6 kg (01/31 0617) Last BM Date : 08/03/22 General:   Alert and oriented, pleasant Head:  Normocephalic and atraumatic. Eyes:  No icterus, sclera clear. Conjuctiva pink.  Mouth:  Without lesions, mucosa pink and moist.  Heart:  S1, S2 present, no murmurs noted.  Lungs: Clear to auscultation bilaterally, without wheezing, rales, or rhonchi.  Abdomen:  Bowel sounds present, non-tender, abdomen full but soft. No HSM or hernias noted. No rebound or guarding. No masses appreciated  Msk:  Symmetrical without gross deformities. Normal posture. Pulses:  Normal pulses noted. Extremities:  Without clubbing or edema. Neurologic:  Alert and  oriented x4;  grossly normal neurologically. No asterixis  Skin:  Warm and dry, intact without significant lesions.  Psych:  Alert and cooperative. Normal mood and affect.   Lab Results: Recent Labs    08/02/22 1224 08/03/22 0452 08/04/22 0504  WBC 9.1 9.1 11.9*  HGB 13.1 11.3* 10.6*  HCT 35.0* 30.5* 29.2*  PLT 158 111* 97*   BMET Recent Labs    08/02/22 1224 08/03/22 0452 08/04/22 0504  NA 134* 134* 131*  K 2.4* 3.0* 3.3*  CL 96* 100 98  CO2 '28 26 26  '$ GLUCOSE 155* 155* 253*  BUN 5* 6 10  CREATININE 0.87 0.78 0.76  CALCIUM 8.4* 8.0* 7.9*   LFT Recent Labs    08/02/22 1224 08/03/22 0452 08/04/22 0504  PROT 6.8 5.9* 5.7*  ALBUMIN 2.6* 2.5* 2.4*  AST 248* 206* 142*  ALT 77* 61* 52*   ALKPHOS 262* 232* 221*  BILITOT 3.1* 3.3* 3.0*   PT/INR Recent Labs    08/02/22 1224 08/03/22 0452  LABPROT 20.1* 21.0*  INR 1.7* 1.8*   Studies/Results: MR LIVER W WO CONTRAST  Result Date: 08/03/2022 CLINICAL DATA:  Cirrhosis.  [place]. EXAM: MRI ABDOMEN WITHOUT AND WITH CONTRAST TECHNIQUE: Multiplanar multisequence MR imaging of the abdomen was performed both before and after the administration of intravenous contrast. CONTRAST:  31m GADAVIST GADOBUTROL 1 MMOL/ML IV SOLN COMPARISON:  CT 08/02/2022 and older. FINDINGS: Lower chest: Trace pleural fluid. Breathing motion along several series. Hepatobiliary: There is a micronodular liver. Overall liver is slightly enlarged. The liver is without areas of restricted diffusion. No hypervascular lesions with washout. The tiny areas of low-density on the prior CT scan are again seen as small subcentimeter foci which are dark on T2 foci which do not clearly show abnormal enhancement and may be multiple regenerative nodules. Main portal vein has a diameter of 13 mm of the liver hilum. No biliary ductal dilatation. Gallbladder is nondilated. Gallstones Pancreas: Mild pancreatic atrophy. No pancreatic duct dilatation. No enhancing pancreatic mass. Spleen: Spleen has preserved enhancement. Spleen is enlarged at 14.4 cm in AP length. Adrenals/Urinary Tract: Adrenal glands are preserved. No enhancing renal mass or collecting system dilatation. Stomach/Bowel: Stomach is nondilated. Mild gastric fold thickening. The visualized bowel is nondilated. Normal retrocecal appendix. Vascular/Lymphatic: Normal caliber aorta and IVC. There are scattered  varices identified including along the lower esophagus, upper stomach. There are several prominent lymph nodes identified in the upper abdomen including periceliac. Example series 14, image 42 measuring 2.3 x 0.8 cm. Portacaval node series 14, image 43 measuring 20 by 10 mm. These are nonspecific. Other:  Anasarca.  Scattered  mesenteric haziness and ascites. Musculoskeletal: Focal L3 spinal hemangioma. IMPRESSION: Evidence of chronic liver disease. Micronodular liver with multiple presumed regenerative nodules. No clear aggressive appearing lesions with restricted diffusion, hypervascularity or washout at this time. Mild ascites. Varices including along the stomach and lower esophagus. Mild splenic enlargement. The portal vein is patent. Nonspecific upper abdominal nodes. Electronically Signed   By: Jill Side M.D.   On: 08/03/2022 18:40   ECHOCARDIOGRAM COMPLETE  Result Date: 08/03/2022    ECHOCARDIOGRAM REPORT   Patient Name:   JIMMEY HENGEL Date of Exam: 08/03/2022 Medical Rec #:  [id number]         Height:       66.0 in Accession #:    [id number]        Weight:       199.3 lb Date of Birth:  1966/05/02        BSA:          1.997 m Patient Age:    57 years          BP:           137/77 mmHg Patient Gender: M                 HR:           74 bpm. Exam Location:  [name] Procedure: 2D Echo, 3D Echo, Cardiac Doppler, Color Doppler and Strain Analysis Indications:    Dyspnea R06.00  History:        Patient has no prior history of Echocardiogram examinations.                 Signs/Symptoms:Dyspnea and Edema; Risk Factors:Hypertension,                 Diabetes, Dyslipidemia, Former Smoker and Sleep Apnea.  Sonographer:    Greer Pickerel Referring Phys: [phone] DAVID TAT  Sonographer Comments: Image acquisition challenging due to patient body habitus and Image acquisition challenging due to respiratory motion. Global longitudinal strain was attempted. IMPRESSIONS  1. Left ventricular ejection fraction, by estimation, is 60 to 65%. The left ventricle has normal function. The left ventricle has no regional wall motion abnormalities. There is mild left ventricular hypertrophy. Left ventricular diastolic parameters were normal. The average left ventricular global longitudinal strain is -21.1 %. The global longitudinal strain is normal.   2. Right ventricular systolic function was not well visualized. The right ventricular size is normal.  3. Left atrial size was moderately dilated.  4. The mitral valve is grossly normal. No evidence of mitral valve regurgitation. No evidence of mitral stenosis.  5. The aortic valve was not well visualized. There is moderate calcification of the aortic valve. Aortic valve regurgitation is not visualized. No aortic stenosis is present. Comparison(s): No prior Echocardiogram. FINDINGS  Left Ventricle: Left ventricular ejection fraction, by estimation, is 60 to 65%. The left ventricle has normal function. The left ventricle has no regional wall motion abnormalities. The average left ventricular global longitudinal strain is -21.1 %. The global longitudinal strain is normal. The left ventricular internal cavity size was normal in size. There is mild left ventricular hypertrophy. Left ventricular diastolic parameters were normal. Right Ventricle:  The right ventricular size is normal. No increase in right ventricular wall thickness. Right ventricular systolic function was not well visualized. Left Atrium: Left atrial size was moderately dilated. Right Atrium: Right atrial size was normal in size. Pericardium: There is no evidence of pericardial effusion. Mitral Valve: The mitral valve is grossly normal. No evidence of mitral valve regurgitation. No evidence of mitral valve stenosis. Tricuspid Valve: The tricuspid valve is grossly normal. Tricuspid valve regurgitation is not demonstrated. No evidence of tricuspid stenosis. Aortic Valve: The aortic valve was not well visualized. There is moderate calcification of the aortic valve. Aortic valve regurgitation is not visualized. No aortic stenosis is present. Pulmonic Valve: The pulmonic valve was not well visualized. Pulmonic valve regurgitation is not visualized. No evidence of pulmonic stenosis. Aorta: The aortic root and ascending aorta are structurally normal, with no  evidence of dilitation. Venous: The inferior vena cava was not well visualized. IAS/Shunts: The interatrial septum was not well visualized.  LEFT VENTRICLE PLAX 2D LVIDd:         4.60 cm   Diastology LVIDs:         2.90 cm   LV e' medial:    7.77 cm/s LV PW:         1.20 cm   LV E/e' medial:  12.5 LV IVS:        0.90 cm   LV e' lateral:   11.70 cm/s LVOT diam:     2.10 cm   LV E/e' lateral: 8.3 LV SV:         70 LV SV Index:   35        2D Longitudinal Strain LVOT Area:     3.46 cm  2D Strain GLS Avg:     -21.1 %                           3D Volume EF:                          3D EF:        56 %                          LV EDV:       221 ml                          LV ESV:       97 ml                          LV SV:        124 ml RIGHT VENTRICLE RV S prime:     13.50 cm/s TAPSE (M-mode): 1.8 cm LEFT ATRIUM             Index        RIGHT ATRIUM           Index LA diam:        3.80 cm 1.90 cm/m   RA Area:     12.50 cm LA Vol (A2C):   74.4 ml 37.26 ml/m  RA Volume:   21.10 ml  10.57 ml/m LA Vol (A4C):   78.7 ml 39.41 ml/m LA Biplane Vol: 81.5 ml 40.82 ml/m  AORTIC VALVE LVOT Vmax:   106.00 cm/s LVOT Vmean:  68.400 cm/s LVOT VTI:  0.203 m  AORTA Ao Root diam: 3.40 cm Ao Asc diam:  3.00 cm MITRAL VALVE               TRICUSPID VALVE MV Area (PHT): 4.31 cm    TR Peak grad:   4.7 mmHg MV Decel Time: 176 msec    TR Vmax:        108.00 cm/s MV E velocity: 97.40 cm/s MV A velocity: 81.50 cm/s  SHUNTS MV E/A ratio:  1.20        Systemic VTI:  0.20 m                            Systemic Diam: 2.10 cm Vishnu Priya Mallipeddi Electronically signed by Lorelee Cover Mallipeddi Signature Date/Time: 08/03/2022/1:10:55 PM    Final    US Paracentesis  Result Date: 08/02/2022 INDICATION: Ascites EXAM: ULTRASOUND GUIDED DIAGNOSTIC AND THERAPEUTIC PARACENTESIS MEDICATIONS: None. COMPLICATIONS: None immediate. PROCEDURE: Informed written consent was obtained from the patient after a discussion of the risks, benefits and  alternatives to treatment. A timeout was performed prior to the initiation of the procedure. Initial ultrasound scanning demonstrates a large amount of ascites within the right lower abdominal quadrant. The right lower abdomen was prepped and draped in the usual sterile fashion. 1% lidocaine was used for local anesthesia. Following this, a 19 gauge, 7-cm, Yueh catheter was introduced. An ultrasound image was saved for documentation purposes. The paracentesis was performed. The catheter was removed and a dressing was applied. The patient tolerated the procedure well without immediate post procedural complication. Patient received post-procedure intravenous albumin; see nursing notes for details. FINDINGS: A total of approximately 3.5 L of yellow ascitic fluid was removed. Samples were sent to the laboratory as requested by the clinical team. IMPRESSION: Successful ultrasound-guided paracentesis yielding 3.5 liters of peritoneal fluid. Electronically Signed   By: Lavonia Dana M.D.   On: 08/02/2022 16:52   DG Chest Portable 1 View  Result Date: 08/02/2022 CLINICAL DATA:  Shortness of breath EXAM: PORTABLE CHEST 1 VIEW COMPARISON:  09/11/2017 FINDINGS: The heart size and mediastinal contours are within normal limits. Mild diffuse interstitial opacity. The visualized skeletal structures are unremarkable. IMPRESSION: Mild diffuse bilateral interstitial pulmonary opacity, suggesting edema or atypical/viral infection. No focal airspace opacity. Electronically Signed   By: Delanna Ahmadi M.D.   On: 08/02/2022 14:27   CT ABDOMEN PELVIS W CONTRAST  Result Date: 08/02/2022 CLINICAL DATA:  Abdominal pain, weakness, leg swelling and abdominal swelling for 1 week. EXAM: CT ABDOMEN AND PELVIS WITH CONTRAST TECHNIQUE: Multidetector CT imaging of the abdomen and pelvis was performed using the standard protocol following bolus administration of intravenous contrast. RADIATION DOSE REDUCTION: This exam was performed according to  the departmental dose-optimization program which includes automated exposure control, adjustment of the mA and/or kV according to patient size and/or use of iterative reconstruction technique. CONTRAST:  117m OMNIPAQUE IOHEXOL 300 MG/ML  SOLN COMPARISON:  02/21/2014 FINDINGS: Lower chest: No acute abnormality. No pleural fluid or airspace consolidation. Hepatobiliary: There is hypertrophy of the lateral segment of left hepatic lobe and caudate lobe of liver. The contour the liver appears irregular and nodular. Heterogeneous appearance of the liver parenchyma with numerous tiny low-density foci are scattered throughout which measure up to 7 mm. These are new when compared with 02/21/2014. stones identified within the gallbladder. No significant gallbladder wall thickening or bile duct dilatation. Pancreas: Unremarkable. No pancreatic ductal dilatation or surrounding inflammatory changes. Spleen:  Normal in size without focal abnormality. Adrenals/Urinary Tract: Adrenal glands are unremarkable. Kidneys are normal, without renal calculi, focal lesion, or hydronephrosis. Bladder is unremarkable. Stomach/Bowel: Stomach appears normal. No small bowel wall thickening, inflammation or distension. The appendix is visualized and is unremarkable. There is mild edema involving the colon from the cecum to the level of the splenic flexure Vascular/Lymphatic: Aortic atherosclerosis. The portal vein appears patent. Esophageal and gastric varices identified. Recanalization of the umbilical vein. No abdominopelvic adenopathy. Reproductive: Prostate is unremarkable. Other: There is a moderate volume of ascites within the abdomen and pelvis. No focal fluid collections identified. Right inguinal hernia contains fat as well as ascites. Musculoskeletal: Subcutaneous edema is identified along both flanks as well as the proximal portions of the lower extremities. No acute or suspicious osseous findings. IMPRESSION: 1. Morphologic features  of the liver compatible with cirrhosis. 2. Stigmata of portal venous hypertension identified including moderate volume of ascites, esophageal and gastric varices and recanalization of the umbilical vein. 3. Heterogeneous appearance of the liver parenchyma with numerous tiny low-density foci scattered throughout which measure up to 7 mm. These are new when compared with 02/21/2014. These are nonspecific and may reflect multiple dysplastic nodules. Consider follow-up imaging with nonemergent MRI of the liver without and with contrast material for further characterization 4. Gallstones. 5. Mild edema involving the colon from the cecum to the level of the splenic flexure. This is nonspecific and may be related to underlying liver disease. 6. Right inguinal hernia contains fat as well as ascites. 7. Subcutaneous edema is identified along both flanks as well as the proximal portions of the lower extremities. 8.  Aortic Atherosclerosis (ICD10-I70.0). Electronically Signed   By: Kerby Moors M.D.   On: 08/02/2022 13:38    Assessment: Zebulin Siegel. [place] 57 year old male with history of alcohol abuse, elastography in Sept 2023 with fatty liver, presenting now to the ED with week long history of SOB, weakness, lower extremity edema, and abdominal distension, found to have cirrhosis on CT this admission with moderate volume ascites, esophageal and gastric varices, possibly dysplastic hepatic nodules with recommendations for MRI non-urgently.     Decompensated cirrhosis:  With ascites/anasarca and evidence of esophageal and gastric varices per CT this admission. No prior EGD. MELD 3.0 is 21. Paracentesis Monday yielding 3.5 L.  PMNs less than 250.  Gram stain negative.  Culture with no growth and acid-fast smear pending.  On lasix '40mg'$  and spironolactone '100mg'$  daily, both started yesterday. Cr is normal. K improved to 3.3, however, sodium down to 131 from 134. Electrolytes will need to be monitored closely. No signs of  HE at this time. He will need close outpatient following of his cirrhosis outpatient EGD to assess varices.    Alcoholic hepatitis:  DF 73.5 yesterday, up from 35.8 Monday, started on prednisolone yesterday, DF is 32.8 today (PT control of 13). Will need to calculate Lille score on 2/5 to monitor for steroid responder.    Liver lesions:  New numerous tiny low-density foci scattered throughout liver parenchyma noted on CT this admission, may reflect multiple dysplastic nodules. AFP normal at 3.9. MRI ordered by hospitalist yesterday more consistent with micronodular liver with multiple presumed regenerative nodules, no washout or aggressive appearing lesions.   Hypokalemia/hyponatremia:  K improved to 3.3, sodium declined from 134 to 131. Management per hospitalist. Needs close monitoring with starting diuretics yesterday.    Plan: Continue with lasix '40mg'$  and spironolactone '100mg'$  daily  Continue prednisolone daily Calculate Lille score  on 2/5 Daily MELD labs (CMP, INR)  Hypokalemia and hyponatremia management per hospitalist Monitor for signs of HE Strict 2g sodium diet Complete ETOH cessation Outpatient EGD for varices    LOS: 2 days    08/04/2022, 9:38 AM  Uilani Sanville L. Alver Sorrow, MSN, APRN, AGNP-C Adult-Gerontology Nurse Practitioner [facility]

## 2022-08-04 NOTE — Progress Notes (Signed)
PROGRESS NOTE  [name] KCL:[id number] DOB: February 10, 1966 DOA: 08/02/2022 PCP: Celene Squibb, MD  Brief History:  57 year old male with a history of hypertension, hyperlipidemia, diabetes mellitus type 2, fatty liver, and depression presenting with 2-week history of increasing lower extremity edema and increasing abdominal girth.  The patient states that he was having trouble bending over to get his socks on on the day of admission.  He has had some subjective chills but denies any fever.  He denies any headache, chest pain, cough, hemoptysis, diarrhea, abdominal pain, dysuria, hematuria.  There is no hematochezia or melena.  He has intermittent nausea and vomiting a couple times a week without any blood or coffee grounds.  He has had some dyspnea on exertion.  He denies any orthopnea. The patient has drank alcohol daily for at least 30 years.  He continues to smoke about 1/2 pack/day.  He has approximately 20-pack-year history.  His last drink was in the evening of 08/01/2022. In the ED, the patient was afebrile and hemodynamically stable with oxygen saturation 100% room air.  WBC 9.1, hemoglobin 13.1, platelets 1-58,000.  Sodium 134, potassium 2.4, bicarbonate 20, BUN 5, creatinine 0.7.  AST 248, ALT 77, alkaline phosphatase 262, total bilirubin 3.1.  Lipase 68.  BNP 96.  CT of the abdomen and pelvis showed nodular liver.  There is moderate ascites.  There was gastric and esophageal varices.  There is mild edema of the colon from the cecum to the splenic flexure.  There was subcutaneous edema in the flanks.  The patient was started on IV furosemide.  GI was consulted to assist with management.   Assessment/Plan: Decompensated liver cirrhosis with anasarca -Start IV furosemide -Start spironolactone -Echo--EF 60-65%, no WMA, normal RVF -Urine protein creatinine ratio -Check alpha-fetoprotein -Accurate I's and O's -Daily weights -08/02/2022 CT abd/pelvis -Patient has had a history of  fatty liver noted on imaging--likely ASH and NASH -Previous extensive workup for elevated LFTs>> Acute hepatitis panel negative, alpha-1 antitrypsin level normal, ANA negative, anti-smooth muscle antibody negative, iron/TIBC/ferritin normal, AMA negative, hepatitis B surface antibody nonreactive, immunoglobulins unremarkable, TTG IgA less than 2.  -paracentesis>>neg for SBP (3.5 L removed)   Hypokalemia -oral replacement ordered, recheck in AM  -Magnesium pending -Hold HCTZ   Hypervolemic hyponatremia -Anticipate improvement with IV diuresis   Transaminasemia -Previous extensive workup for elevated LFTs>> Acute hepatitis panel negative, alpha-1 antitrypsin level normal, ANA negative, anti-smooth muscle antibody negative, iron/TIBC/ferritin normal, AMA negative, hepatitis B surface antibody nonreactive, immunoglobulins unremarkable, TTG IgA less than 2.    Controlled diabetes mellitus type 2 with steroid induced hyperglycemia -NovoLog sliding scale -added Semglee 12 units daily plus novolog 4 units TID with meal>50% plus continue SSI, increased CBG monitoring to 5 times per day.  -Hold metformin -1/29 A1C--5.1 CBG (last 3)  Recent Labs    08/03/22 2110 08/04/22 0723 08/04/22 1117  GLUCAP 335* 231* 262*      Essential hypertension -Holding amlodipine and losartan to allow for blood pressure margin for diuresis   Class I obesity -BMI 34.06 -Lifestyle modification   Elevated Lipase -not clinically significant   Alcohol abuse -CIWA   Anxiety/depression -continue fluoxetine  Liver lesions -incidental on CT abd -MR liver - consistent with chronic findings, no worrisome lesions noted.     Family Communication:   spouse updated at bedside 1/30  Consultants:  GI  Code Status:  FULL   DVT Prophylaxis:  [place] Heparin  Procedures: As Listed in Progress Note Above  Antibiotics: None    Subjective: Patient reports less abdominal distension today.     Objective: Vitals:   08/03/22 1426 08/03/22 2256 08/04/22 0617 08/04/22 0902  BP: 124/68 131/73 111/63 121/64  Pulse: 66 89 86 85  Resp: '20 18 20 18  '$ Temp: 99 F (37.2 C) 98 F (36.7 C) 98.7 F (37.1 C) 98.2 F (36.8 C)  TempSrc: Oral   Oral  SpO2: 99% 99% 94% 97%  Weight:   91.6 kg   Height:        Intake/Output Summary (Last 24 hours) at 08/04/2022 1143 Last data filed at 08/04/2022 0600 Gross per 24 hour  Intake 840 ml  Output --  Net 840 ml   Weight change: -4.109 kg Exam:  General:  Pt is alert, follows commands appropriately, not in acute distress HEENT: No icterus, No thrush, No neck mass, [place]/AT Cardiovascular: normal S1/S2, no rubs, no gallops Respiratory: diffuse bibasilar crackles. No wheeze or rales heard.  Abdomen: Soft/+BS, non tender, non distended, no guarding Extremities: 1-2 + pitting LE edema, No lymphangitis, No petechiae, No rashes, no synovitis   Data Reviewed: I have personally reviewed following labs and imaging studies Basic Metabolic Panel: Recent Labs  Lab 08/02/22 1224 08/03/22 0452 08/04/22 0504  NA 134* 134* 131*  K 2.4* 3.0* 3.3*  CL 96* 100 98  CO2 '28 26 26  '$ GLUCOSE 155* 155* 253*  BUN 5* 6 10  CREATININE 0.87 0.78 0.76  CALCIUM 8.4* 8.0* 7.9*  MG 1.8  --   --    Liver Function Tests: Recent Labs  Lab 08/02/22 1224 08/03/22 0452 08/04/22 0504  AST 248* 206* 142*  ALT 77* 61* 52*  ALKPHOS 262* 232* 221*  BILITOT 3.1* 3.3* 3.0*  PROT 6.8 5.9* 5.7*  ALBUMIN 2.6* 2.5* 2.4*   Recent Labs  Lab 08/02/22 1224  LIPASE 68*   No results for input(s): "AMMONIA" in the last 168 hours. Coagulation Profile: Recent Labs  Lab 08/02/22 1224 08/03/22 0452  INR 1.7* 1.8*   CBC: Recent Labs  Lab 08/02/22 1224 08/03/22 0452 08/04/22 0504  WBC 9.1 9.1 11.9*  HGB 13.1 11.3* 10.6*  HCT 35.0* 30.5* 29.2*  MCV 103.9* 104.1* 105.0*  PLT 158 111* 97*   Cardiac Enzymes: No results for input(s): "CKTOTAL", "CKMB",  "CKMBINDEX", "TROPONINI" in the last 168 hours. BNP: Invalid input(s): "POCBNP" CBG: Recent Labs  Lab 08/03/22 1215 08/03/22 1622 08/03/22 2110 08/04/22 0723 08/04/22 1117  GLUCAP 269* 275* 335* 231* 262*   HbA1C: Recent Labs    08/03/22 0452  HGBA1C 5.1   Urine analysis:    Component Value Date/Time   COLORURINE YELLOW 08/02/2022 1304   APPEARANCEUR CLEAR 08/02/2022 1304   LABSPEC 1.004 (L) 08/02/2022 1304   PHURINE 7.0 08/02/2022 1304   GLUCOSEU NEGATIVE 08/02/2022 1304   HGBUR NEGATIVE 08/02/2022 1304   BILIRUBINUR NEGATIVE 08/02/2022 1304   BILIRUBINUR negative 07/01/2020 1608   KETONESUR NEGATIVE 08/02/2022 1304   PROTEINUR NEGATIVE 08/02/2022 1304   UROBILINOGEN 0.2 07/01/2020 1608   NITRITE NEGATIVE 08/02/2022 1304   LEUKOCYTESUR NEGATIVE 08/02/2022 1304    Recent Results (from the past 240 hour(s))  Culture, body fluid w Gram Stain-bottle     Status: None (Preliminary result)   Collection Time: 08/02/22  4:03 PM   Specimen: Peritoneal Washings  Result Value Ref Range Status   Specimen Description PERITONEAL  Final   Special Requests   Final    BOTTLES  DRAWN AEROBIC AND ANAEROBIC Blood Culture adequate volume   Culture   Final    NO GROWTH 1 DAY Performed at [facility], [address] [place], [place] [address]    Report Status PENDING  Incomplete  Gram stain     Status: None   Collection Time: 08/02/22  4:03 PM   Specimen: Peritoneal Washings  Result Value Ref Range Status   Specimen Description PERITONEAL  Final   Special Requests NONE  Final   Gram Stain   Final    NO ORGANISMS SEEN WBC PRESENT, PREDOMINANTLY MONONUCLEAR CYTOSPIN SMEAR Performed at [facility], [address] [place], [place] [address]    Report Status 08/02/2022 FINAL  Final     Scheduled Meds:  FLUoxetine  40 mg Oral Daily   folic acid  1 mg Oral Daily   furosemide  40 mg Intravenous Daily   heparin  5,000 Units Subcutaneous Q8H   insulin aspart  0-5 Units  Subcutaneous QHS   insulin aspart  0-9 Units Subcutaneous TID WC   insulin aspart  4 Units Subcutaneous TID WC   insulin glargine-yfgn  12 Units Subcutaneous Daily   montelukast  10 mg Oral QHS   multivitamin with minerals  1 tablet Oral Daily   potassium chloride  40 mEq Oral BID   prednisoLONE  40 mg Oral QAC breakfast   spironolactone  100 mg Oral Daily   thiamine  100 mg Oral Daily   Or   thiamine  100 mg Intravenous Daily   Continuous Infusions:  Procedures/Studies: MR LIVER W WO CONTRAST  Result Date: 08/03/2022 CLINICAL DATA:  Cirrhosis.  [place]. EXAM: MRI ABDOMEN WITHOUT AND WITH CONTRAST TECHNIQUE: Multiplanar multisequence MR imaging of the abdomen was performed both before and after the administration of intravenous contrast. CONTRAST:  67m GADAVIST GADOBUTROL 1 MMOL/ML IV SOLN COMPARISON:  CT 08/02/2022 and older. FINDINGS: Lower chest: Trace pleural fluid. Breathing motion along several series. Hepatobiliary: There is a micronodular liver. Overall liver is slightly enlarged. The liver is without areas of restricted diffusion. No hypervascular lesions with washout. The tiny areas of low-density on the prior CT scan are again seen as small subcentimeter foci which are dark on T2 foci which do not clearly show abnormal enhancement and may be multiple regenerative nodules. Main portal vein has a diameter of 13 mm of the liver hilum. No biliary ductal dilatation. Gallbladder is nondilated. Gallstones Pancreas: Mild pancreatic atrophy. No pancreatic duct dilatation. No enhancing pancreatic mass. Spleen: Spleen has preserved enhancement. Spleen is enlarged at 14.4 cm in AP length. Adrenals/Urinary Tract: Adrenal glands are preserved. No enhancing renal mass or collecting system dilatation. Stomach/Bowel: Stomach is nondilated. Mild gastric fold thickening. The visualized bowel is nondilated. Normal retrocecal appendix. Vascular/Lymphatic: Normal caliber aorta and IVC. There are scattered  varices identified including along the lower esophagus, upper stomach. There are several prominent lymph nodes identified in the upper abdomen including periceliac. Example series 14, image 42 measuring 2.3 x 0.8 cm. Portacaval node series 14, image 43 measuring 20 by 10 mm. These are nonspecific. Other:  Anasarca.  Scattered mesenteric haziness and ascites. Musculoskeletal: Focal L3 spinal hemangioma. IMPRESSION: Evidence of chronic liver disease. Micronodular liver with multiple presumed regenerative nodules. No clear aggressive appearing lesions with restricted diffusion, hypervascularity or washout at this time. Mild ascites. Varices including along the stomach and lower esophagus. Mild splenic enlargement. The portal vein is patent. Nonspecific upper abdominal nodes. Electronically Signed   By: ALarose HiresD.  On: 08/03/2022 18:40   ECHOCARDIOGRAM COMPLETE  Result Date: 08/03/2022    ECHOCARDIOGRAM REPORT   Patient Name:   Shawn Velazquez Date of Exam: 08/03/2022 Medical Rec #:  [id number]         Height:       66.0 in Accession #:    [id number]        Weight:       199.3 lb Date of Birth:  Oct 10, 1965        BSA:          1.997 m Patient Age:    69 years          BP:           137/77 mmHg Patient Gender: M                 HR:           74 bpm. Exam Location:  [name] Procedure: 2D Echo, 3D Echo, Cardiac Doppler, Color Doppler and Strain Analysis Indications:    Dyspnea R06.00  History:        Patient has no prior history of Echocardiogram examinations.                 Signs/Symptoms:Dyspnea and Edema; Risk Factors:Hypertension,                 Diabetes, Dyslipidemia, Former Smoker and Sleep Apnea.  Sonographer:    Greer Pickerel Referring Phys: [phone] DAVID TAT  Sonographer Comments: Image acquisition challenging due to patient body habitus and Image acquisition challenging due to respiratory motion. Global longitudinal strain was attempted. IMPRESSIONS  1. Left ventricular ejection fraction, by  estimation, is 60 to 65%. The left ventricle has normal function. The left ventricle has no regional wall motion abnormalities. There is mild left ventricular hypertrophy. Left ventricular diastolic parameters were normal. The average left ventricular global longitudinal strain is -21.1 %. The global longitudinal strain is normal.  2. Right ventricular systolic function was not well visualized. The right ventricular size is normal.  3. Left atrial size was moderately dilated.  4. The mitral valve is grossly normal. No evidence of mitral valve regurgitation. No evidence of mitral stenosis.  5. The aortic valve was not well visualized. There is moderate calcification of the aortic valve. Aortic valve regurgitation is not visualized. No aortic stenosis is present. Comparison(s): No prior Echocardiogram. FINDINGS  Left Ventricle: Left ventricular ejection fraction, by estimation, is 60 to 65%. The left ventricle has normal function. The left ventricle has no regional wall motion abnormalities. The average left ventricular global longitudinal strain is -21.1 %. The global longitudinal strain is normal. The left ventricular internal cavity size was normal in size. There is mild left ventricular hypertrophy. Left ventricular diastolic parameters were normal. Right Ventricle: The right ventricular size is normal. No increase in right ventricular wall thickness. Right ventricular systolic function was not well visualized. Left Atrium: Left atrial size was moderately dilated. Right Atrium: Right atrial size was normal in size. Pericardium: There is no evidence of pericardial effusion. Mitral Valve: The mitral valve is grossly normal. No evidence of mitral valve regurgitation. No evidence of mitral valve stenosis. Tricuspid Valve: The tricuspid valve is grossly normal. Tricuspid valve regurgitation is not demonstrated. No evidence of tricuspid stenosis. Aortic Valve: The aortic valve was not well visualized. There is moderate  calcification of the aortic valve. Aortic valve regurgitation is not visualized. No aortic stenosis is present. Pulmonic Valve: The pulmonic valve was not  well visualized. Pulmonic valve regurgitation is not visualized. No evidence of pulmonic stenosis. Aorta: The aortic root and ascending aorta are structurally normal, with no evidence of dilitation. Venous: The inferior vena cava was not well visualized. IAS/Shunts: The interatrial septum was not well visualized.  LEFT VENTRICLE PLAX 2D LVIDd:         4.60 cm   Diastology LVIDs:         2.90 cm   LV e' medial:    7.77 cm/s LV PW:         1.20 cm   LV E/e' medial:  12.5 LV IVS:        0.90 cm   LV e' lateral:   11.70 cm/s LVOT diam:     2.10 cm   LV E/e' lateral: 8.3 LV SV:         70 LV SV Index:   35        2D Longitudinal Strain LVOT Area:     3.46 cm  2D Strain GLS Avg:     -21.1 %                           3D Volume EF:                          3D EF:        56 %                          LV EDV:       221 ml                          LV ESV:       97 ml                          LV SV:        124 ml RIGHT VENTRICLE RV S prime:     13.50 cm/s TAPSE (M-mode): 1.8 cm LEFT ATRIUM             Index        RIGHT ATRIUM           Index LA diam:        3.80 cm 1.90 cm/m   RA Area:     12.50 cm LA Vol (A2C):   74.4 ml 37.26 ml/m  RA Volume:   21.10 ml  10.57 ml/m LA Vol (A4C):   78.7 ml 39.41 ml/m LA Biplane Vol: 81.5 ml 40.82 ml/m  AORTIC VALVE LVOT Vmax:   106.00 cm/s LVOT Vmean:  68.400 cm/s LVOT VTI:    0.203 m  AORTA Ao Root diam: 3.40 cm Ao Asc diam:  3.00 cm MITRAL VALVE               TRICUSPID VALVE MV Area (PHT): 4.31 cm    TR Peak grad:   4.7 mmHg MV Decel Time: 176 msec    TR Vmax:        108.00 cm/s MV E velocity: 97.40 cm/s MV A velocity: 81.50 cm/s  SHUNTS MV E/A ratio:  1.20        Systemic VTI:  0.20 m  Systemic Diam: 2.10 cm Vishnu Priya Mallipeddi Electronically signed by Lorelee Cover Mallipeddi Signature Date/Time:  08/03/2022/1:10:55 PM    Final    US Paracentesis  Result Date: 08/02/2022 INDICATION: Ascites EXAM: ULTRASOUND GUIDED DIAGNOSTIC AND THERAPEUTIC PARACENTESIS MEDICATIONS: None. COMPLICATIONS: None immediate. PROCEDURE: Informed written consent was obtained from the patient after a discussion of the risks, benefits and alternatives to treatment. A timeout was performed prior to the initiation of the procedure. Initial ultrasound scanning demonstrates a large amount of ascites within the right lower abdominal quadrant. The right lower abdomen was prepped and draped in the usual sterile fashion. 1% lidocaine was used for local anesthesia. Following this, a 19 gauge, 7-cm, Yueh catheter was introduced. An ultrasound image was saved for documentation purposes. The paracentesis was performed. The catheter was removed and a dressing was applied. The patient tolerated the procedure well without immediate post procedural complication. Patient received post-procedure intravenous albumin; see nursing notes for details. FINDINGS: A total of approximately 3.5 L of yellow ascitic fluid was removed. Samples were sent to the laboratory as requested by the clinical team. IMPRESSION: Successful ultrasound-guided paracentesis yielding 3.5 liters of peritoneal fluid. Electronically Signed   By: Lavonia Dana M.D.   On: 08/02/2022 16:52   DG Chest Portable 1 View  Result Date: 08/02/2022 CLINICAL DATA:  Shortness of breath EXAM: PORTABLE CHEST 1 VIEW COMPARISON:  09/11/2017 FINDINGS: The heart size and mediastinal contours are within normal limits. Mild diffuse interstitial opacity. The visualized skeletal structures are unremarkable. IMPRESSION: Mild diffuse bilateral interstitial pulmonary opacity, suggesting edema or atypical/viral infection. No focal airspace opacity. Electronically Signed   By: Delanna Ahmadi M.D.   On: 08/02/2022 14:27   CT ABDOMEN PELVIS W CONTRAST  Result Date: 08/02/2022 CLINICAL DATA:  Abdominal pain,  weakness, leg swelling and abdominal swelling for 1 week. EXAM: CT ABDOMEN AND PELVIS WITH CONTRAST TECHNIQUE: Multidetector CT imaging of the abdomen and pelvis was performed using the standard protocol following bolus administration of intravenous contrast. RADIATION DOSE REDUCTION: This exam was performed according to the departmental dose-optimization program which includes automated exposure control, adjustment of the mA and/or kV according to patient size and/or use of iterative reconstruction technique. CONTRAST:  146m OMNIPAQUE IOHEXOL 300 MG/ML  SOLN COMPARISON:  02/21/2014 FINDINGS: Lower chest: No acute abnormality. No pleural fluid or airspace consolidation. Hepatobiliary: There is hypertrophy of the lateral segment of left hepatic lobe and caudate lobe of liver. The contour the liver appears irregular and nodular. Heterogeneous appearance of the liver parenchyma with numerous tiny low-density foci are scattered throughout which measure up to 7 mm. These are new when compared with 02/21/2014. stones identified within the gallbladder. No significant gallbladder wall thickening or bile duct dilatation. Pancreas: Unremarkable. No pancreatic ductal dilatation or surrounding inflammatory changes. Spleen: Normal in size without focal abnormality. Adrenals/Urinary Tract: Adrenal glands are unremarkable. Kidneys are normal, without renal calculi, focal lesion, or hydronephrosis. Bladder is unremarkable. Stomach/Bowel: Stomach appears normal. No small bowel wall thickening, inflammation or distension. The appendix is visualized and is unremarkable. There is mild edema involving the colon from the cecum to the level of the splenic flexure Vascular/Lymphatic: Aortic atherosclerosis. The portal vein appears patent. Esophageal and gastric varices identified. Recanalization of the umbilical vein. No abdominopelvic adenopathy. Reproductive: Prostate is unremarkable. Other: There is a moderate volume of ascites within  the abdomen and pelvis. No focal fluid collections identified. Right inguinal hernia contains fat as well as ascites. Musculoskeletal: Subcutaneous edema is identified along both flanks as  well as the proximal portions of the lower extremities. No acute or suspicious osseous findings. IMPRESSION: 1. Morphologic features of the liver compatible with cirrhosis. 2. Stigmata of portal venous hypertension identified including moderate volume of ascites, esophageal and gastric varices and recanalization of the umbilical vein. 3. Heterogeneous appearance of the liver parenchyma with numerous tiny low-density foci scattered throughout which measure up to 7 mm. These are new when compared with 02/21/2014. These are nonspecific and may reflect multiple dysplastic nodules. Consider follow-up imaging with nonemergent MRI of the liver without and with contrast material for further characterization 4. Gallstones. 5. Mild edema involving the colon from the cecum to the level of the splenic flexure. This is nonspecific and may be related to underlying liver disease. 6. Right inguinal hernia contains fat as well as ascites. 7. Subcutaneous edema is identified along both flanks as well as the proximal portions of the lower extremities. 8.  Aortic Atherosclerosis (ICD10-I70.0). Electronically Signed   By: Kerby Moors M.D.   On: 08/02/2022 13:38    Miking Usrey Wynetta Emery, MD  [facility]  If 7PM-7AM, please contact night-coverage [email or web address] Password [facility] 08/04/2022, 11:43 AM   LOS: 2 days

## 2022-08-05 ENCOUNTER — Encounter: Payer: Self-pay | Admitting: Internal Medicine

## 2022-08-05 ENCOUNTER — Other Ambulatory Visit: Payer: Self-pay

## 2022-08-05 ENCOUNTER — Telehealth: Payer: Self-pay | Admitting: Gastroenterology

## 2022-08-05 DIAGNOSIS — R7989 Other specified abnormal findings of blood chemistry: Secondary | ICD-10-CM

## 2022-08-05 DIAGNOSIS — K7031 Alcoholic cirrhosis of liver with ascites: Secondary | ICD-10-CM | POA: Diagnosis not present

## 2022-08-05 DIAGNOSIS — K7011 Alcoholic hepatitis with ascites: Secondary | ICD-10-CM | POA: Diagnosis not present

## 2022-08-05 DIAGNOSIS — R1030 Lower abdominal pain, unspecified: Secondary | ICD-10-CM

## 2022-08-05 DIAGNOSIS — R601 Generalized edema: Secondary | ICD-10-CM | POA: Diagnosis not present

## 2022-08-05 DIAGNOSIS — K760 Fatty (change of) liver, not elsewhere classified: Secondary | ICD-10-CM

## 2022-08-05 DIAGNOSIS — E876 Hypokalemia: Secondary | ICD-10-CM | POA: Diagnosis not present

## 2022-08-05 LAB — COMPREHENSIVE METABOLIC PANEL
ALT: 50 U/L — ABNORMAL HIGH (ref 0–44)
AST: 115 U/L — ABNORMAL HIGH (ref 15–41)
Albumin: 2.5 g/dL — ABNORMAL LOW (ref 3.5–5.0)
Alkaline Phosphatase: 216 U/L — ABNORMAL HIGH (ref 38–126)
Anion gap: 8 (ref 5–15)
BUN: 11 mg/dL (ref 6–20)
CO2: 25 mmol/L (ref 22–32)
Calcium: 7.8 mg/dL — ABNORMAL LOW (ref 8.9–10.3)
Chloride: 98 mmol/L (ref 98–111)
Creatinine, Ser: 0.84 mg/dL (ref 0.61–1.24)
GFR, Estimated: >60 mL/min (ref >60)
Glucose, Bld: 209 mg/dL — ABNORMAL HIGH (ref 70–99)
Potassium: 4 mmol/L (ref 3.5–5.1)
Sodium: 131 mmol/L — ABNORMAL LOW (ref 135–145)
Total Bilirubin: 2.6 mg/dL — ABNORMAL HIGH (ref 0.3–1.2)
Total Protein: 6 g/dL — ABNORMAL LOW (ref 6.5–8.1)

## 2022-08-05 LAB — CBC WITH DIFFERENTIAL/PLATELET
Abs Immature Granulocytes: 0.05 10*3/uL (ref 0.00–0.07)
Basophils Absolute: 0 10*3/uL (ref 0.0–0.1)
Basophils Relative: 0 %
Eosinophils Absolute: 0.2 10*3/uL (ref 0.0–0.5)
Eosinophils Relative: 2 %
HCT: 29.4 % — ABNORMAL LOW (ref 39.0–52.0)
Hemoglobin: 11.1 g/dL — ABNORMAL LOW (ref 13.0–17.0)
Immature Granulocytes: 0 %
Lymphocytes Relative: 16 %
Lymphs Abs: 2.1 10*3/uL (ref 0.7–4.0)
MCH: 39.4 pg — ABNORMAL HIGH (ref 26.0–34.0)
MCHC: 37.8 g/dL — ABNORMAL HIGH (ref 30.0–36.0)
MCV: 104.3 fL — ABNORMAL HIGH (ref 80.0–100.0)
Monocytes Absolute: 1.1 10*3/uL — ABNORMAL HIGH (ref 0.1–1.0)
Monocytes Relative: 8 %
Neutro Abs: 9.4 10*3/uL — ABNORMAL HIGH (ref 1.7–7.7)
Neutrophils Relative %: 74 %
Platelets: 97 10*3/uL — ABNORMAL LOW (ref 150–400)
RBC: 2.82 MIL/uL — ABNORMAL LOW (ref 4.22–5.81)
RDW: 13.2 % (ref 11.5–15.5)
WBC: 12.8 10*3/uL — ABNORMAL HIGH (ref 4.0–10.5)
nRBC: 0 % (ref 0.0–0.2)

## 2022-08-05 LAB — PROTIME-INR
INR: 1.6 — ABNORMAL HIGH (ref 0.8–1.2)
Prothrombin Time: 19.1 seconds — ABNORMAL HIGH (ref 11.4–15.2)

## 2022-08-05 LAB — GLUCOSE, CAPILLARY
Glucose-Capillary: 233 mg/dL — ABNORMAL HIGH (ref 70–99)
Glucose-Capillary: 200 mg/dL — ABNORMAL HIGH (ref 70–99)
Glucose-Capillary: 293 mg/dL — ABNORMAL HIGH (ref 70–99)

## 2022-08-05 LAB — MAGNESIUM: Magnesium: 1.9 mg/dL (ref 1.7–2.4)

## 2022-08-05 MED ORDER — METFORMIN HCL ER 500 MG PO TB24: 1000.0000 mg | ORAL_TABLET | Freq: Two times a day (BID) | ORAL | 1 refills | Status: DC

## 2022-08-05 MED ORDER — LANTUS SOLOSTAR 100 UNIT/ML ~~LOC~~ SOPN: 40.0000 [IU] | PEN_INJECTOR | Freq: Every day | SUBCUTANEOUS | 3 refills | Status: DC

## 2022-08-05 MED ORDER — SPIRONOLACTONE 100 MG PO TABS: 100.0000 mg | ORAL_TABLET | Freq: Every day | ORAL | 1 refills | Status: DC

## 2022-08-05 MED ORDER — VITAMIN B-1 100 MG PO TABS: 100.0000 mg | ORAL_TABLET | Freq: Every day | ORAL | 1 refills | Status: DC

## 2022-08-05 MED ORDER — PREDNISOLONE 15 MG/5ML PO SOLN: ORAL | 0 refills | Status: DC

## 2022-08-05 MED ORDER — FUROSEMIDE 40 MG PO TABS: 40.0000 mg | ORAL_TABLET | Freq: Every day | ORAL | 1 refills | Status: DC

## 2022-08-05 NOTE — Telephone Encounter (Signed)
Pt was made an appt with Vicente Males on 08/24/2022, labs were ordered and pt was instructed to go to the lab for bloodwork on 08/09/2022.

## 2022-08-05 NOTE — Discharge Summary (Signed)
Physician Discharge Summary  Shawn Velazquez TDV:[id number] DOB: [birth year] DOA: 08/02/2022  PCP: Celene Squibb, MD GI: [facility]   Admit date: 08/02/2022 Discharge date: 08/05/2022  Admitted From:  Home  Disposition: Home   Recommendations for Outpatient Follow-up:  Follow up with PCP in 1 weeks Follow up with Roseanne Kaufman [facility] on 08/24/22 Please obtain labs on 08/09/22 at [facility] as scheduled  Please avoid alcohol Complete prednisolone taper as prescribed for next 39 days   Discharge Condition: STABLE   CODE STATUS: FULL DIET: 2 gm sodium restricted    Brief Hospitalization Summary: Please see all hospital notes, images, labs for full details of the hospitalization. ADMISSION HPI:  57 year old male with a history of hypertension, hyperlipidemia, diabetes mellitus type 2, fatty liver, and depression presenting with 2-week history of increasing lower extremity edema and increasing abdominal girth.  The patient states that he was having trouble bending over to get his socks on on the day of admission.  He has had some subjective chills but denies any fever.  He denies any headache, chest pain, cough, hemoptysis, diarrhea, abdominal pain, dysuria, hematuria.  There is no hematochezia or melena.  He has intermittent nausea and vomiting a couple times a week without any blood or coffee grounds.  He has had some dyspnea on exertion.  He denies any orthopnea.  The patient has drank alcohol daily for at least 30 years.  He continues to smoke about 1/2 pack/day.  He has approximately 20-pack-year history.  His last drink was in the evening of 08/01/2022.  In the ED, the patient was afebrile and hemodynamically stable with oxygen saturation 100% room air.  WBC 9.1, hemoglobin 13.1, platelets 1-58,000.  Sodium 134, potassium 2.4, bicarbonate 20, BUN 5, creatinine 0.7.  AST 248, ALT 77, alkaline phosphatase 262, total bilirubin 3.1.  Lipase 68.  BNP 96.  CT of the abdomen and pelvis showed  nodular liver.  There is moderate ascites.  There was gastric and esophageal varices.  There is mild edema of the colon from the cecum to the splenic flexure.  There was subcutaneous edema in the flanks.  The patient was started on IV furosemide.  GI was consulted to assist with management.   HOSPITAL COURSE BY PROBLEM LISTING  Decompensated liver cirrhosis with anasarca -Start IV furosemide -Start spironolactone -Echo--EF 60-65%, no WMA, normal RVF -Urine protein creatinine ratio -Check alpha-fetoprotein -Accurate I's and O's -Daily weights -08/02/2022 CT abd/pelvis -Patient has had a history of fatty liver noted on imaging--likely ASH and NASH -Previous extensive workup for elevated LFTs>> Acute hepatitis panel negative, alpha-1 antitrypsin level normal, ANA negative, anti-smooth muscle antibody negative, iron/TIBC/ferritin normal, AMA negative, hepatitis B surface antibody nonreactive, immunoglobulins unremarkable, TTG IgA less than 2.  -paracentesis>>neg for SBP (3.5 L removed)   Hypokalemia -oral replacement given    -Magnesium repleted -Hold HCTZ -K up to 4.1 on spironolactone so we held additional K until he follows up with [facility] on 08/09/22 for repeat labs   Hypervolemic hyponatremia -Anticipate improvement with IV diuresis   Transaminasemia -Previous extensive workup for elevated LFTs>> Acute hepatitis panel negative, alpha-1 antitrypsin level normal, ANA negative, anti-smooth muscle antibody negative, iron/TIBC/ferritin normal, AMA negative, hepatitis B surface antibody nonreactive, immunoglobulins unremarkable, TTG IgA less than 2.    Controlled diabetes mellitus type 2 with steroid induced hyperglycemia -RESUME HOME BASAL BOLUS INSULIN AND METFORMIN -INCREASED LANTUS TO 40 UNITS AT DISCHARGE -INCREASED METFORMIN ER TO 1000 MG BID WITH MEALS DUE TO PATIENT  HAVING STEROID INDUCED HYPERGLYCEMIA AND WILL BE DISCHARGED HOME ON LONG PREDNISOLONE TAPER UP TO 39 DAYS PER GI  INSTRUCTIONS.  -1/29 A1C--5.1   Essential hypertension -held amlodipine and losartan to allow for blood pressure margin for diuresis -did not restart these at DC due to soft BPs.    Class I obesity -BMI 34.06 -Lifestyle modification   Elevated Lipase -not clinically significant   Alcohol abuse -CIWA   Anxiety/depression -continue fluoxetine   Liver lesions -incidental on CT abd -MR liver - consistent with chronic findings, no worrisome lesions noted.  Discharge Diagnoses:  Principal Problem:   Anasarca Active Problems:   Hypertension, essential   Class 1 obesity   Elevated LFTs   Alcohol abuse   Alcoholic cirrhosis of liver with ascites (HCC)   Hypokalemia   Alcoholic hepatitis with ascites   Discharge Instructions:  Allergies as of 08/05/2022       Reactions   Neosporin + Pain Relief Max St [neomy-bacit-polymyx-pramoxine] Rash        Medication List     STOP taking these medications    amLODipine 5 MG tablet Commonly known as: NORVASC   hydrochlorothiazide 25 MG tablet Commonly known as: HYDRODIURIL   losartan 50 MG tablet Commonly known as: COZAAR   propranolol 10 MG tablet Commonly known as: INDERAL       TAKE these medications    acetaminophen 325 MG tablet Commonly known as: TYLENOL Take 650 mg by mouth as needed for mild pain, headache or moderate pain.   albuterol 108 (90 Base) MCG/ACT inhaler Commonly known as: VENTOLIN HFA INHALE 2 PUFFS INTO THE LUNGS EVERY 6 HOURS AS NEEDED FOR WHEEZING OR SHORTNESS OF BREATH   Belsomra 15 MG Tabs Generic drug: Suvorexant Take 1 tablet by mouth daily.   Dexcom G7 Sensor Misc Inject 1 application. into the skin as directed. Change sensor every 10 days as directed.   FLUoxetine 10 MG capsule Commonly known as: PROZAC TAKE 4 CAPSULES BY MOUTH EVERY DAY   furosemide 40 MG tablet Commonly known as: Lasix Take 1 tablet (40 mg total) by mouth daily. Start taking on: August 06, 2022    glucose blood test strip Use as instructed to monitor glucose 4 times daily   insulin lispro 100 UNIT/ML KwikPen Commonly known as: HumaLOG KwikPen Inject 8-14 Units into the skin 3 (three) times daily.   Lantus SoloStar 100 UNIT/ML Solostar Pen Generic drug: insulin glargine Inject 40 Units into the skin at bedtime. What changed: how much to take   metFORMIN 500 MG 24 hr tablet Commonly known as: GLUCOPHAGE-XR Take 2 tablets (1,000 mg total) by mouth 2 (two) times daily with a meal. What changed: how much to take   montelukast 10 MG tablet Commonly known as: SINGULAIR TAKE 1 TABLET(10 MG) BY MOUTH AT BEDTIME   multivitamin tablet Take 1 tablet by mouth daily.   omeprazole 20 MG tablet Commonly known as: PRILOSEC OTC Take 20 mg by mouth every other day.   Pen Needles 32G X 4 MM Misc 1 each by Does not apply route in the morning, at noon, in the evening, and at bedtime. Use to inject insulin 4 times daily   prednisoLONE 15 MG/5ML Soln Commonly known as: PRELONE Take 13.3 mLs (40 mg total) by mouth daily before breakfast for 25 days, THEN 8.3 mLs (25 mg total) daily before breakfast for 7 days, THEN 5 mLs (15 mg total) daily before breakfast for 7 days, THEN 3 mLs (9 mg total)  daily before breakfast for 5 days. Start taking on: August 06, 2022   spironolactone 100 MG tablet Commonly known as: ALDACTONE Take 1 tablet (100 mg total) by mouth daily. Start taking on: August 06, 2022   thiamine 100 MG tablet Commonly known as: Vitamin B-1 Take 1 tablet (100 mg total) by mouth daily. Start taking on: August 06, 2022        Follow-up Information     [name], NP. Go on 08/24/2022.   Specialty: Gastroenterology Why: Hospital Follow Up Contact information: [place] [address] [phone]         Celene Squibb, MD. Schedule an appointment as soon as possible for a visit in 1 week(s).   Specialty: Internal Medicine Why: Hospital Follow  Up Contact information: [place] [address] [phone]                Allergies  Allergen Reactions   Neosporin + Pain Relief Max St [Neomy-Bacit-Polymyx-Pramoxine] Rash   Allergies as of 08/05/2022       Reactions   Neosporin + Pain Relief Max St [neomy-bacit-polymyx-pramoxine] Rash        Medication List     STOP taking these medications    amLODipine 5 MG tablet Commonly known as: NORVASC   hydrochlorothiazide 25 MG tablet Commonly known as: HYDRODIURIL   losartan 50 MG tablet Commonly known as: COZAAR   propranolol 10 MG tablet Commonly known as: INDERAL       TAKE these medications    acetaminophen 325 MG tablet Commonly known as: TYLENOL Take 650 mg by mouth as needed for mild pain, headache or moderate pain.   albuterol 108 (90 Base) MCG/ACT inhaler Commonly known as: VENTOLIN HFA INHALE 2 PUFFS INTO THE LUNGS EVERY 6 HOURS AS NEEDED FOR WHEEZING OR SHORTNESS OF BREATH   Belsomra 15 MG Tabs Generic drug: Suvorexant Take 1 tablet by mouth daily.   Dexcom G7 Sensor Misc Inject 1 application. into the skin as directed. Change sensor every 10 days as directed.   FLUoxetine 10 MG capsule Commonly known as: PROZAC TAKE 4 CAPSULES BY MOUTH EVERY DAY   furosemide 40 MG tablet Commonly known as: Lasix Take 1 tablet (40 mg total) by mouth daily. Start taking on: August 06, 2022   glucose blood test strip Use as instructed to monitor glucose 4 times daily   insulin lispro 100 UNIT/ML KwikPen Commonly known as: HumaLOG KwikPen Inject 8-14 Units into the skin 3 (three) times daily.   Lantus SoloStar 100 UNIT/ML Solostar Pen Generic drug: insulin glargine Inject 40 Units into the skin at bedtime. What changed: how much to take   metFORMIN 500 MG 24 hr tablet Commonly known as: GLUCOPHAGE-XR Take 2 tablets (1,000 mg total) by mouth 2 (two) times daily with a meal. What changed: how much to take   montelukast 10 MG  tablet Commonly known as: SINGULAIR TAKE 1 TABLET(10 MG) BY MOUTH AT BEDTIME   multivitamin tablet Take 1 tablet by mouth daily.   omeprazole 20 MG tablet Commonly known as: PRILOSEC OTC Take 20 mg by mouth every other day.   Pen Needles 32G X 4 MM Misc 1 each by Does not apply route in the morning, at noon, in the evening, and at bedtime. Use to inject insulin 4 times daily   prednisoLONE 15 MG/5ML Soln Commonly known as: PRELONE Take 13.3 mLs (40 mg total) by mouth daily before breakfast for 25 days,  THEN 8.3 mLs (25 mg total) daily before breakfast for 7 days, THEN 5 mLs (15 mg total) daily before breakfast for 7 days, THEN 3 mLs (9 mg total) daily before breakfast for 5 days. Start taking on: August 06, 2022   spironolactone 100 MG tablet Commonly known as: ALDACTONE Take 1 tablet (100 mg total) by mouth daily. Start taking on: August 06, 2022   thiamine 100 MG tablet Commonly known as: Vitamin B-1 Take 1 tablet (100 mg total) by mouth daily. Start taking on: August 06, 2022        Procedures/Studies: MR LIVER W WO CONTRAST  Result Date: 08/03/2022 CLINICAL DATA:  Cirrhosis.  [place]. EXAM: MRI ABDOMEN WITHOUT AND WITH CONTRAST TECHNIQUE: Multiplanar multisequence MR imaging of the abdomen was performed both before and after the administration of intravenous contrast. CONTRAST:  64m GADAVIST GADOBUTROL 1 MMOL/ML IV SOLN COMPARISON:  CT 08/02/2022 and older. FINDINGS: Lower chest: Trace pleural fluid. Breathing motion along several series. Hepatobiliary: There is a micronodular liver. Overall liver is slightly enlarged. The liver is without areas of restricted diffusion. No hypervascular lesions with washout. The tiny areas of low-density on the prior CT scan are again seen as small subcentimeter foci which are dark on T2 foci which do not clearly show abnormal enhancement and may be multiple regenerative nodules. Main portal vein has a diameter of 13 mm of the liver hilum. No  biliary ductal dilatation. Gallbladder is nondilated. Gallstones Pancreas: Mild pancreatic atrophy. No pancreatic duct dilatation. No enhancing pancreatic mass. Spleen: Spleen has preserved enhancement. Spleen is enlarged at 14.4 cm in AP length. Adrenals/Urinary Tract: Adrenal glands are preserved. No enhancing renal mass or collecting system dilatation. Stomach/Bowel: Stomach is nondilated. Mild gastric fold thickening. The visualized bowel is nondilated. Normal retrocecal appendix. Vascular/Lymphatic: Normal caliber aorta and IVC. There are scattered varices identified including along the lower esophagus, upper stomach. There are several prominent lymph nodes identified in the upper abdomen including periceliac. Example series 14, image 42 measuring 2.3 x 0.8 cm. Portacaval node series 14, image 43 measuring 20 by 10 mm. These are nonspecific. Other:  Anasarca.  Scattered mesenteric haziness and ascites. Musculoskeletal: Focal L3 spinal hemangioma. IMPRESSION: Evidence of chronic liver disease. Micronodular liver with multiple presumed regenerative nodules. No clear aggressive appearing lesions with restricted diffusion, hypervascularity or washout at this time. Mild ascites. Varices including along the stomach and lower esophagus. Mild splenic enlargement. The portal vein is patent. Nonspecific upper abdominal nodes. Electronically Signed   By: AJill SideM.D.   On: 08/03/2022 18:40   ECHOCARDIOGRAM COMPLETE  Result Date: 08/03/2022    ECHOCARDIOGRAM REPORT   Patient Name:   Shawn SANGERDate of Exam: 08/03/2022 Medical Rec #:  [id number]        Height:       66.0 in Accession #:    [id number]       Weight:       199.3 lb Date of Birth:  [birth year]       BSA:          1.997 m Patient Age:    89years          BP:           137/77 mmHg Patient Gender: M                 HR:           74 bpm. Exam [facility]  [facility]cedure: 2D  Echo, 3D Echo, Cardiac Doppler, Color Doppler and Strain Analysis  Indications:    Dyspnea R06.00  History:        Patient has no prior history of Echocardiogram examinations.                 Signs/Symptoms:Dyspnea and Edema; Risk Factors:Hypertension,                 Diabetes, Dyslipidemia, Former Smoker and Sleep Apnea.  Sonographer:    Greer Pickerel Referring Phys: [phone] DAVID TAT  Sonographer Comments: Image acquisition challenging due to patient body habitus and Image acquisition challenging due to respiratory motion. Global longitudinal strain was attempted. IMPRESSIONS  1. Left ventricular ejection fraction, by estimation, is 60 to 65%. The left ventricle has normal function. The left ventricle has no regional wall motion abnormalities. There is mild left ventricular hypertrophy. Left ventricular diastolic parameters were normal. The average left ventricular global longitudinal strain is -21.1 %. The global longitudinal strain is normal.  2. Right ventricular systolic function was not well visualized. The right ventricular size is normal.  3. Left atrial size was moderately dilated.  4. The mitral valve is grossly normal. No evidence of mitral valve regurgitation. No evidence of mitral stenosis.  5. The aortic valve was not well visualized. There is moderate calcification of the aortic valve. Aortic valve regurgitation is not visualized. No aortic stenosis is present. Comparison(s): No prior Echocardiogram. FINDINGS  Left Ventricle: Left ventricular ejection fraction, by estimation, is 60 to 65%. The left ventricle has normal function. The left ventricle has no regional wall motion abnormalities. The average left ventricular global longitudinal strain is -21.1 %. The global longitudinal strain is normal. The left ventricular internal cavity size was normal in size. There is mild left ventricular hypertrophy. Left ventricular diastolic parameters were normal. Right Ventricle: The right ventricular size is normal. No increase in right ventricular wall thickness. Right  ventricular systolic function was not well visualized. Left Atrium: Left atrial size was moderately dilated. Right Atrium: Right atrial size was normal in size. Pericardium: There is no evidence of pericardial effusion. Mitral Valve: The mitral valve is grossly normal. No evidence of mitral valve regurgitation. No evidence of mitral valve stenosis. Tricuspid Valve: The tricuspid valve is grossly normal. Tricuspid valve regurgitation is not demonstrated. No evidence of tricuspid stenosis. Aortic Valve: The aortic valve was not well visualized. There is moderate calcification of the aortic valve. Aortic valve regurgitation is not visualized. No aortic stenosis is present. Pulmonic Valve: The pulmonic valve was not well visualized. Pulmonic valve regurgitation is not visualized. No evidence of pulmonic stenosis. Aorta: The aortic root and ascending aorta are structurally normal, with no evidence of dilitation. Venous: The inferior vena cava was not well visualized. IAS/Shunts: The interatrial septum was not well visualized.  LEFT VENTRICLE PLAX 2D LVIDd:         4.60 cm   Diastology LVIDs:         2.90 cm   LV e' medial:    7.77 cm/s LV PW:         1.20 cm   LV E/e' medial:  12.5 LV IVS:        0.90 cm   LV e' lateral:   11.70 cm/s LVOT diam:     2.10 cm   LV E/e' lateral: 8.3 LV SV:         70 LV SV Index:   35        2D Longitudinal Strain  LVOT Area:     3.46 cm  2D Strain GLS Avg:     -21.1 %                           3D Volume EF:                          3D EF:        56 %                          LV EDV:       221 ml                          LV ESV:       97 ml                          LV SV:        124 ml RIGHT VENTRICLE RV S prime:     13.50 cm/s TAPSE (M-mode): 1.8 cm LEFT ATRIUM             Index        RIGHT ATRIUM           Index LA diam:        3.80 cm 1.90 cm/m   RA Area:     12.50 cm LA Vol (A2C):   74.4 ml 37.26 ml/m  RA Volume:   21.10 ml  10.57 ml/m LA Vol (A4C):   78.7 ml 39.41 ml/m LA Biplane  Vol: 81.5 ml 40.82 ml/m  AORTIC VALVE LVOT Vmax:   106.00 cm/s LVOT Vmean:  68.400 cm/s LVOT VTI:    0.203 m  AORTA Ao Root diam: 3.40 cm Ao Asc diam:  3.00 cm MITRAL VALVE               TRICUSPID VALVE MV Area (PHT): 4.31 cm    TR Peak grad:   4.7 mmHg MV Decel Time: 176 msec    TR Vmax:        108.00 cm/s MV E velocity: 97.40 cm/s MV A velocity: 81.50 cm/s  SHUNTS MV E/A ratio:  1.20        Systemic VTI:  0.20 m                            Systemic Diam: 2.10 cm Vishnu Priya Mallipeddi Electronically signed by Lorelee Cover Mallipeddi Signature Date/Time: 08/03/2022/1:10:55 PM    Final    US Paracentesis  Result Date: 08/02/2022 INDICATION: Ascites EXAM: ULTRASOUND GUIDED DIAGNOSTIC AND THERAPEUTIC PARACENTESIS MEDICATIONS: None. COMPLICATIONS: None immediate. PROCEDURE: Informed written consent was obtained from the patient after a discussion of the risks, benefits and alternatives to treatment. A timeout was performed prior to the initiation of the procedure. Initial ultrasound scanning demonstrates a large amount of ascites within the right lower abdominal quadrant. The right lower abdomen was prepped and draped in the usual sterile fashion. 1% lidocaine was used for local anesthesia. Following this, a 19 gauge, 7-cm, Yueh catheter was introduced. An ultrasound image was saved for documentation purposes. The paracentesis was performed. The catheter was removed and a dressing was applied. The patient tolerated the procedure well without immediate post procedural complication. Patient received post-procedure intravenous albumin; see nursing  notes for details. FINDINGS: A total of approximately 3.5 L of yellow ascitic fluid was removed. Samples were sent to the laboratory as requested by the clinical team. IMPRESSION: Successful ultrasound-guided paracentesis yielding 3.5 liters of peritoneal fluid. Electronically Signed   By: Lavonia Dana M.D.   On: 08/02/2022 16:52   DG Chest Portable 1 View  Result Date:  08/02/2022 CLINICAL DATA:  Shortness of breath EXAM: PORTABLE CHEST 1 VIEW COMPARISON:  09/11/2017 FINDINGS: The heart size and mediastinal contours are within normal limits. Mild diffuse interstitial opacity. The visualized skeletal structures are unremarkable. IMPRESSION: Mild diffuse bilateral interstitial pulmonary opacity, suggesting edema or atypical/viral infection. No focal airspace opacity. Electronically Signed   By: Delanna Ahmadi M.D.   On: 08/02/2022 14:27   CT ABDOMEN PELVIS W CONTRAST  Result Date: 08/02/2022 CLINICAL DATA:  Abdominal pain, weakness, leg swelling and abdominal swelling for 1 week. EXAM: CT ABDOMEN AND PELVIS WITH CONTRAST TECHNIQUE: Multidetector CT imaging of the abdomen and pelvis was performed using the standard protocol following bolus administration of intravenous contrast. RADIATION DOSE REDUCTION: This exam was performed according to the departmental dose-optimization program which includes automated exposure control, adjustment of the mA and/or kV according to patient size and/or use of iterative reconstruction technique. CONTRAST:  136m OMNIPAQUE IOHEXOL 300 MG/ML  SOLN COMPARISON:  02/21/2014 FINDINGS: Lower chest: No acute abnormality. No pleural fluid or airspace consolidation. Hepatobiliary: There is hypertrophy of the lateral segment of left hepatic lobe and caudate lobe of liver. The contour the liver appears irregular and nodular. Heterogeneous appearance of the liver parenchyma with numerous tiny low-density foci are scattered throughout which measure up to 7 mm. These are new when compared with 02/21/2014. stones identified within the gallbladder. No significant gallbladder wall thickening or bile duct dilatation. Pancreas: Unremarkable. No pancreatic ductal dilatation or surrounding inflammatory changes. Spleen: Normal in size without focal abnormality. Adrenals/Urinary Tract: Adrenal glands are unremarkable. Kidneys are normal, without renal calculi, focal  lesion, or hydronephrosis. Bladder is unremarkable. Stomach/Bowel: Stomach appears normal. No small bowel wall thickening, inflammation or distension. The appendix is visualized and is unremarkable. There is mild edema involving the colon from the cecum to the level of the splenic flexure Vascular/Lymphatic: Aortic atherosclerosis. The portal vein appears patent. Esophageal and gastric varices identified. Recanalization of the umbilical vein. No abdominopelvic adenopathy. Reproductive: Prostate is unremarkable. Other: There is a moderate volume of ascites within the abdomen and pelvis. No focal fluid collections identified. Right inguinal hernia contains fat as well as ascites. Musculoskeletal: Subcutaneous edema is identified along both flanks as well as the proximal portions of the lower extremities. No acute or suspicious osseous findings. IMPRESSION: 1. Morphologic features of the liver compatible with cirrhosis. 2. Stigmata of portal venous hypertension identified including moderate volume of ascites, esophageal and gastric varices and recanalization of the umbilical vein. 3. Heterogeneous appearance of the liver parenchyma with numerous tiny low-density foci scattered throughout which measure up to 7 mm. These are new when compared with 02/21/2014. These are nonspecific and may reflect multiple dysplastic nodules. Consider follow-up imaging with nonemergent MRI of the liver without and with contrast material for further characterization 4. Gallstones. 5. Mild edema involving the colon from the cecum to the level of the splenic flexure. This is nonspecific and may be related to underlying liver disease. 6. Right inguinal hernia contains fat as well as ascites. 7. Subcutaneous edema is identified along both flanks as well as the proximal portions of the lower extremities. 8.  Aortic  Atherosclerosis (ICD10-I70.0). Electronically Signed   By: Kerby Moors M.D.   On: 08/02/2022 13:38    Discharge Exam: Vitals:    08/05/22 0453 08/05/22 1215  BP: 137/73 138/82  Pulse: 80 70  Resp: 18 18  Temp: 98.1 F (36.7 C) 98.6 F (37 C)  SpO2: 97% 98%   Vitals:   08/04/22 2059 08/05/22 0453 08/05/22 0616 08/05/22 1215  BP: 132/77 137/73  138/82  Pulse: 75 80  70  Resp: '18 18  18  '$ Temp: 98.4 F (36.9 C) 98.1 F (36.7 C)  98.6 F (37 C)  TempSrc: Oral Oral  Oral  SpO2: 98% 97%  98%  Weight:   91.3 kg   Height:           Subjective: Patient reports less abdominal distension today.     Objective:       Vitals:    08/03/22 1426 08/03/22 2256 08/04/22 0617 08/04/22 0902  BP: 124/68 131/73 111/63 121/64  Pulse: 66 89 86 85  Resp: '20 18 20 18  '$ Temp: 99 F (37.2 C) 98 F (36.7 C) 98.7 F (37.1 C) 98.2 F (36.8 C)  TempSrc: Oral     Oral  SpO2: 99% 99% 94% 97%  Weight:     91.6 kg    Height:              Intake/Output Summary (Last 24 hours) at 08/04/2022 1143 Last data filed at 08/04/2022 0600    Gross per 24 hour  Intake 840 ml  Output --  Net 840 ml    Weight change: -4.109 kg Exam:   General:  Pt is alert, follows commands appropriately, not in acute distress HEENT: No icterus, No thrush, No neck mass, [place]/AT Cardiovascular: normal S1/S2, no rubs, no gallops Respiratory: no increased work of breathing, BBS clear.  No wheeze or rales heard.  Abdomen: Soft/+BS, non tender, non distended, no guarding Extremities: 1+ bilateral LE edema, No lymphangitis, No petechiae, No rashes, no synovitis     The results of significant diagnostics from this hospitalization (including imaging, microbiology, ancillary and laboratory) are listed below for reference.     Microbiology: Recent Results (from the past 240 hour(s))  Acid Fast Smear (AFB)     Status: None   Collection Time: 08/02/22  4:03 PM   Specimen: Peritoneal Cavity; Body Fluid  Result Value Ref Range Status   AFB Specimen Processing Concentration  Final   Acid Fast Smear Negative  Final    Comment: (NOTE) Performed At: [facility] [place], [place] [id number] Rush Farmer MD RX:[id number]    Source (AFB) PERITONEAL  Final    Comment: Performed at [facility], [address] [place], [place] [address]  Culture, body fluid w Gram Stain-bottle     Status: None (Preliminary result)   Collection Time: 08/02/22  4:03 PM   Specimen: Peritoneal Washings  Result Value Ref Range Status   Specimen Description   Final    PERITONEAL Performed at [facility], [address], [place], [place] [address]    Special Requests   Final    BOTTLES DRAWN AEROBIC AND ANAEROBIC Blood Culture adequate volume Performed at [facility], [address], [place], [place] [address]    Culture   Final    NO GROWTH 2 DAYS Performed at [facility] [address] [place],  [address]    Report Status PENDING  Incomplete  Gram stain     Status:  None   Collection Time: 08/02/22  4:03 PM   Specimen: Peritoneal Washings  Result Value Ref Range Status   Specimen Description PERITONEAL  Final   Special Requests NONE  Final   Gram Stain   Final    NO ORGANISMS SEEN WBC PRESENT, PREDOMINANTLY MONONUCLEAR CYTOSPIN SMEAR Performed at [facility], [address], [place], [place] [address]    Report Status 08/02/2022 FINAL  Final     Labs: BNP (last 3 results) Recent Labs    08/02/22 1224  BNP 06.3   Basic Metabolic Panel: Recent Labs  Lab 08/02/22 1224 08/03/22 0452 08/04/22 0504 08/04/22 1224 08/05/22 0506  NA 134* 134* 131*  --  131*  K 2.4* 3.0* 3.3*  --  4.0  CL 96* 100 98  --  98  CO2 '28 26 26  '$ --  25  GLUCOSE 155* 155* 253*  --  209*  BUN 5* 6 10  --  11  CREATININE 0.87 0.78 0.76  --  0.84  CALCIUM 8.4* 8.0* 7.9*  --  7.8*  MG 1.8  --   --  1.5* 1.9   Liver Function Tests: Recent Labs  Lab 08/02/22 1224 08/03/22 0452 08/04/22 0504 08/05/22 0506  AST 248* 206* 142* 115*  ALT 77* 61* 52* 50*  ALKPHOS 262* 232* 221* 216*  BILITOT 3.1* 3.3* 3.0* 2.6*  PROT 6.8 5.9*  5.7* 6.0*  ALBUMIN 2.6* 2.5* 2.4* 2.5*   Recent Labs  Lab 08/02/22 1224  LIPASE 68*   No results for input(s): "AMMONIA" in the last 168 hours. CBC: Recent Labs  Lab 08/02/22 1224 08/03/22 0452 08/04/22 0504 08/05/22 0506  WBC 9.1 9.1 11.9* 12.8*  NEUTROABS  --   --   --  9.4*  HGB 13.1 11.3* 10.6* 11.1*  HCT 35.0* 30.5* 29.2* 29.4*  MCV 103.9* 104.1* 105.0* 104.3*  PLT 158 111* 97* 97*   Cardiac Enzymes: No results for input(s): "CKTOTAL", "CKMB", "CKMBINDEX", "TROPONINI" in the last 168 hours. BNP: Invalid input(s): "POCBNP" CBG: Recent Labs  Lab 08/04/22 1618 08/04/22 2124 08/05/22 0320 08/05/22 0723 08/05/22 1111  GLUCAP 341* 304* 233* 200* 293*   D-Dimer No results for input(s): "DDIMER" in the last 72 hours. Hgb A1c Recent Labs    08/03/22 0452  HGBA1C 5.1   Lipid Profile No results for input(s): "CHOL", "HDL", "LDLCALC", "TRIG", "CHOLHDL", "LDLDIRECT" in the last 72 hours. Thyroid function studies Recent Labs    08/03/22 0452  TSH 1.256   Anemia work up No results for input(s): "VITAMINB12", "FOLATE", "FERRITIN", "TIBC", "IRON", "RETICCTPCT" in the last 72 hours. Urinalysis    Component Value Date/Time   COLORURINE YELLOW 08/02/2022 1304   APPEARANCEUR CLEAR 08/02/2022 1304   LABSPEC 1.004 (L) 08/02/2022 1304   PHURINE 7.0 08/02/2022 1304   GLUCOSEU NEGATIVE 08/02/2022 1304   HGBUR NEGATIVE 08/02/2022 1304   BILIRUBINUR NEGATIVE 08/02/2022 1304   BILIRUBINUR negative 07/01/2020 1608   KETONESUR NEGATIVE 08/02/2022 1304   PROTEINUR NEGATIVE 08/02/2022 1304   UROBILINOGEN 0.2 07/01/2020 1608   NITRITE NEGATIVE 08/02/2022 1304   LEUKOCYTESUR NEGATIVE 08/02/2022 1304   Sepsis Labs Recent Labs  Lab 08/02/22 1224 08/03/22 0452 08/04/22 0504 08/05/22 0506  WBC 9.1 9.1 11.9* 12.8*   Microbiology Recent Results (from the past 240 hour(s))  Acid Fast Smear (AFB)     Status: None   Collection Time: 08/02/22  4:03 PM   Specimen:  Peritoneal Cavity; Body Fluid  Result Value Ref Range Status   AFB  Specimen Processing Concentration  Final   Acid Fast Smear Negative  Final    Comment: (NOTE) Performed At: [facility] [place], [place] [id number] Rush Farmer MD OF:[id number]    Source (AFB) PERITONEAL  Final    Comment: Performed at [facility], [address] [place], [place] [address]  Culture, body fluid w Gram Stain-bottle     Status: None (Preliminary result)   Collection Time: 08/02/22  4:03 PM   Specimen: Peritoneal Washings  Result Value Ref Range Status   Specimen Description   Final    PERITONEAL Performed at [facility], [address] [place], [place] [address]    Special Requests   Final    BOTTLES DRAWN AEROBIC AND ANAEROBIC Blood Culture adequate volume Performed at [facility], [address] [place], [place] [address]    Culture   Final    NO GROWTH 2 DAYS Performed at [facility] [address] [place], [place] [address]    Report Status PENDING  Incomplete  Gram stain     Status: None   Collection Time: 08/02/22  4:03 PM   Specimen: Peritoneal Washings  Result Value Ref Range Status   Specimen Description PERITONEAL  Final   Special Requests NONE  Final   Gram Stain   Final    NO ORGANISMS SEEN WBC PRESENT, PREDOMINANTLY MONONUCLEAR CYTOSPIN SMEAR Performed at [facility], [address], [place], [place] [address]    Report Status 08/02/2022 FINAL  Final   Time coordinating discharge: 41 mins  SIGNED:  Irwin Brakeman, MD  [facility] 08/05/2022, 3:00 PM How to contact the [facility] Attending or Consulting provider [place] or covering provider during after hours [place], for this patient?  Check the care team in [facility] and look for a) attending/consulting [facility] provider listed and b) the [facility] team listed Log into [email or web address] and use [place]'s universal password to access. If you do not have the password, please contact the hospital  operator. Locate the [facility] provider you are looking for under [facility] and page to a number that you can be directly reached. If you still have difficulty reaching the provider, please page the [facility] (Director on Call) for the Hospitalists listed on amion for assistance.

## 2022-08-05 NOTE — Progress Notes (Addendum)
Gastroenterology Progress Note   Referring Provider: No ref. provider found Primary Care Physician:  Celene Squibb, MD Primary Gastroenterologist:  Elon Alas. Abbey Chatters, DO   Patient ID: Shawn Velazquez; [id number]; 09-08-1965   Subjective:    Feels ok. Tolerating diet.   Objective:   Vital signs in last 24 hours: Temp:  [98.1 F (36.7 C)-98.4 F (36.9 C)] 98.1 F (36.7 C) (02/01 0453) Pulse Rate:  [75-85] 80 (02/01 0453) Resp:  [18] 18 (02/01 0453) BP: (121-137)/(64-77) 137/73 (02/01 0453) SpO2:  [96 %-98 %] 97 % (02/01 0453) Weight:  [91.3 kg] 91.3 kg (02/01 0616) Last BM Date : 08/03/22 General:   Alert,  Well-developed, well-nourished, pleasant and cooperative in NAD Head:  Normocephalic and atraumatic. Eyes:  Sclera clear, no icterus.   Abdomen:  Soft, nontender, full but soft. Normal bowel sounds, without guarding, and without rebound.   Extremities:  Without clubbing, deformity or edema. Neurologic:  Alert and  oriented x4;  grossly normal neurologically. Skin:  Intact without significant lesions or rashes. Psych:  Alert and cooperative. Normal mood and affect.  Intake/Output from previous day: 01/31 0701 - 02/01 0700 In: 541.2 [P.O.:540; IV Piggyback:1.2] Out: -  Intake/Output this shift: No intake/output data recorded.  Lab Results: CBC Recent Labs    08/03/22 0452 08/04/22 0504 08/05/22 0506  WBC 9.1 11.9* 12.8*  HGB 11.3* 10.6* 11.1*  HCT 30.5* 29.2* 29.4*  MCV 104.1* 105.0* 104.3*  PLT 111* 97* 97*   BMET Recent Labs    08/03/22 0452 08/04/22 0504 08/05/22 0506  NA 134* 131* 131*  K 3.0* 3.3* 4.0  CL 100 98 98  CO2 '26 26 25  '$ GLUCOSE 155* 253* 209*  BUN '6 10 11  '$ CREATININE 0.78 0.76 0.84  CALCIUM 8.0* 7.9* 7.8*   LFTs Recent Labs    08/03/22 0452 08/04/22 0504 08/05/22 0506  BILITOT 3.3* 3.0* 2.6*  ALKPHOS 232* 221* 216*  AST 206* 142* 115*  ALT 61* 52* 50*  PROT 5.9* 5.7* 6.0*  ALBUMIN 2.5* 2.4* 2.5*   Recent Labs     08/02/22 1224  LIPASE 68*   PT/INR Recent Labs    08/02/22 1224 08/03/22 0452 08/04/22 1224  LABPROT 20.1* 21.0* 20.1*  INR 1.7* 1.8* 1.7*         Imaging Studies: MR LIVER W WO CONTRAST  Result Date: 08/03/2022 CLINICAL DATA:  Cirrhosis.  [place]. EXAM: MRI ABDOMEN WITHOUT AND WITH CONTRAST TECHNIQUE: Multiplanar multisequence MR imaging of the abdomen was performed both before and after the administration of intravenous contrast. CONTRAST:  81m GADAVIST GADOBUTROL 1 MMOL/ML IV SOLN COMPARISON:  CT 08/02/2022 and older. FINDINGS: Lower chest: Trace pleural fluid. Breathing motion along several series. Hepatobiliary: There is a micronodular liver. Overall liver is slightly enlarged. The liver is without areas of restricted diffusion. No hypervascular lesions with washout. The tiny areas of low-density on the prior CT scan are again seen as small subcentimeter foci which are dark on T2 foci which do not clearly show abnormal enhancement and may be multiple regenerative nodules. Main portal vein has a diameter of 13 mm of the liver hilum. No biliary ductal dilatation. Gallbladder is nondilated. Gallstones Pancreas: Mild pancreatic atrophy. No pancreatic duct dilatation. No enhancing pancreatic mass. Spleen: Spleen has preserved enhancement. Spleen is enlarged at 14.4 cm in AP length. Adrenals/Urinary Tract: Adrenal glands are preserved. No enhancing renal mass or collecting system dilatation. Stomach/Bowel: Stomach is nondilated. Mild gastric fold thickening. The visualized bowel is nondilated.  Normal retrocecal appendix. Vascular/Lymphatic: Normal caliber aorta and IVC. There are scattered varices identified including along the lower esophagus, upper stomach. There are several prominent lymph nodes identified in the upper abdomen including periceliac. Example series 14, image 42 measuring 2.3 x 0.8 cm. Portacaval node series 14, image 43 measuring 20 by 10 mm. These are nonspecific. Other:  Anasarca.   Scattered mesenteric haziness and ascites. Musculoskeletal: Focal L3 spinal hemangioma. IMPRESSION: Evidence of chronic liver disease. Micronodular liver with multiple presumed regenerative nodules. No clear aggressive appearing lesions with restricted diffusion, hypervascularity or washout at this time. Mild ascites. Varices including along the stomach and lower esophagus. Mild splenic enlargement. The portal vein is patent. Nonspecific upper abdominal nodes. Electronically Signed   By: Jill Side M.D.   On: 08/03/2022 18:40   ECHOCARDIOGRAM COMPLETE  Result Date: 08/03/2022    ECHOCARDIOGRAM REPORT   Patient Name:   Shawn Velazquez Date of Exam: 08/03/2022 Medical Rec #:  [id number]         Height:       66.0 in Accession #:    [id number]        Weight:       199.3 lb Date of Birth:  February 08, 1966        BSA:          1.997 m Patient Age:    57 years          BP:           137/77 mmHg Patient Gender: M                 HR:           74 bpm. Exam Location:  [name] Procedure: 2D Echo, 3D Echo, Cardiac Doppler, Color Doppler and Strain Analysis Indications:    Dyspnea R06.00  History:        Patient has no prior history of Echocardiogram examinations.                 Signs/Symptoms:Dyspnea and Edema; Risk Factors:Hypertension,                 Diabetes, Dyslipidemia, Former Smoker and Sleep Apnea.  Sonographer:    Greer Pickerel Referring Phys: [phone] DAVID TAT  Sonographer Comments: Image acquisition challenging due to patient body habitus and Image acquisition challenging due to respiratory motion. Global longitudinal strain was attempted. IMPRESSIONS  1. Left ventricular ejection fraction, by estimation, is 60 to 65%. The left ventricle has normal function. The left ventricle has no regional wall motion abnormalities. There is mild left ventricular hypertrophy. Left ventricular diastolic parameters were normal. The average left ventricular global longitudinal strain is -21.1 %. The global longitudinal strain is  normal.  2. Right ventricular systolic function was not well visualized. The right ventricular size is normal.  3. Left atrial size was moderately dilated.  4. The mitral valve is grossly normal. No evidence of mitral valve regurgitation. No evidence of mitral stenosis.  5. The aortic valve was not well visualized. There is moderate calcification of the aortic valve. Aortic valve regurgitation is not visualized. No aortic stenosis is present. Comparison(s): No prior Echocardiogram. FINDINGS  Left Ventricle: Left ventricular ejection fraction, by estimation, is 60 to 65%. The left ventricle has normal function. The left ventricle has no regional wall motion abnormalities. The average left ventricular global longitudinal strain is -21.1 %. The global longitudinal strain is normal. The left ventricular internal cavity size was normal in size. There is  mild left ventricular hypertrophy. Left ventricular diastolic parameters were normal. Right Ventricle: The right ventricular size is normal. No increase in right ventricular wall thickness. Right ventricular systolic function was not well visualized. Left Atrium: Left atrial size was moderately dilated. Right Atrium: Right atrial size was normal in size. Pericardium: There is no evidence of pericardial effusion. Mitral Valve: The mitral valve is grossly normal. No evidence of mitral valve regurgitation. No evidence of mitral valve stenosis. Tricuspid Valve: The tricuspid valve is grossly normal. Tricuspid valve regurgitation is not demonstrated. No evidence of tricuspid stenosis. Aortic Valve: The aortic valve was not well visualized. There is moderate calcification of the aortic valve. Aortic valve regurgitation is not visualized. No aortic stenosis is present. Pulmonic Valve: The pulmonic valve was not well visualized. Pulmonic valve regurgitation is not visualized. No evidence of pulmonic stenosis. Aorta: The aortic root and ascending aorta are structurally normal,  with no evidence of dilitation. Venous: The inferior vena cava was not well visualized. IAS/Shunts: The interatrial septum was not well visualized.  LEFT VENTRICLE PLAX 2D LVIDd:         4.60 cm   Diastology LVIDs:         2.90 cm   LV e' medial:    7.77 cm/s LV PW:         1.20 cm   LV E/e' medial:  12.5 LV IVS:        0.90 cm   LV e' lateral:   11.70 cm/s LVOT diam:     2.10 cm   LV E/e' lateral: 8.3 LV SV:         70 LV SV Index:   35        2D Longitudinal Strain LVOT Area:     3.46 cm  2D Strain GLS Avg:     -21.1 %                           3D Volume EF:                          3D EF:        56 %                          LV EDV:       221 ml                          LV ESV:       97 ml                          LV SV:        124 ml RIGHT VENTRICLE RV S prime:     13.50 cm/s TAPSE (M-mode): 1.8 cm LEFT ATRIUM             Index        RIGHT ATRIUM           Index LA diam:        3.80 cm 1.90 cm/m   RA Area:     12.50 cm LA Vol (A2C):   74.4 ml 37.26 ml/m  RA Volume:   21.10 ml  10.57 ml/m LA Vol (A4C):   78.7 ml 39.41 ml/m LA Biplane Vol: 81.5 ml 40.82 ml/m  AORTIC VALVE LVOT  Vmax:   106.00 cm/s LVOT Vmean:  68.400 cm/s LVOT VTI:    0.203 m  AORTA Ao Root diam: 3.40 cm Ao Asc diam:  3.00 cm MITRAL VALVE               TRICUSPID VALVE MV Area (PHT): 4.31 cm    TR Peak grad:   4.7 mmHg MV Decel Time: 176 msec    TR Vmax:        108.00 cm/s MV E velocity: 97.40 cm/s MV A velocity: 81.50 cm/s  SHUNTS MV E/A ratio:  1.20        Systemic VTI:  0.20 m                            Systemic Diam: 2.10 cm Vishnu Priya Mallipeddi Electronically signed by Lorelee Cover Mallipeddi Signature Date/Time: 08/03/2022/1:10:55 PM    Final    US Paracentesis  Result Date: 08/02/2022 INDICATION: Ascites EXAM: ULTRASOUND GUIDED DIAGNOSTIC AND THERAPEUTIC PARACENTESIS MEDICATIONS: None. COMPLICATIONS: None immediate. PROCEDURE: Informed written consent was obtained from the patient after a discussion of the risks, benefits and  alternatives to treatment. A timeout was performed prior to the initiation of the procedure. Initial ultrasound scanning demonstrates a large amount of ascites within the right lower abdominal quadrant. The right lower abdomen was prepped and draped in the usual sterile fashion. 1% lidocaine was used for local anesthesia. Following this, a 19 gauge, 7-cm, Yueh catheter was introduced. An ultrasound image was saved for documentation purposes. The paracentesis was performed. The catheter was removed and a dressing was applied. The patient tolerated the procedure well without immediate post procedural complication. Patient received post-procedure intravenous albumin; see nursing notes for details. FINDINGS: A total of approximately 3.5 L of yellow ascitic fluid was removed. Samples were sent to the laboratory as requested by the clinical team. IMPRESSION: Successful ultrasound-guided paracentesis yielding 3.5 liters of peritoneal fluid. Electronically Signed   By: Lavonia Dana M.D.   On: 08/02/2022 16:52   DG Chest Portable 1 View  Result Date: 08/02/2022 CLINICAL DATA:  Shortness of breath EXAM: PORTABLE CHEST 1 VIEW COMPARISON:  09/11/2017 FINDINGS: The heart size and mediastinal contours are within normal limits. Mild diffuse interstitial opacity. The visualized skeletal structures are unremarkable. IMPRESSION: Mild diffuse bilateral interstitial pulmonary opacity, suggesting edema or atypical/viral infection. No focal airspace opacity. Electronically Signed   By: Delanna Ahmadi M.D.   On: 08/02/2022 14:27   CT ABDOMEN PELVIS W CONTRAST  Result Date: 08/02/2022 CLINICAL DATA:  Abdominal pain, weakness, leg swelling and abdominal swelling for 1 week. EXAM: CT ABDOMEN AND PELVIS WITH CONTRAST TECHNIQUE: Multidetector CT imaging of the abdomen and pelvis was performed using the standard protocol following bolus administration of intravenous contrast. RADIATION DOSE REDUCTION: This exam was performed according to  the departmental dose-optimization program which includes automated exposure control, adjustment of the mA and/or kV according to patient size and/or use of iterative reconstruction technique. CONTRAST:  148m OMNIPAQUE IOHEXOL 300 MG/ML  SOLN COMPARISON:  02/21/2014 FINDINGS: Lower chest: No acute abnormality. No pleural fluid or airspace consolidation. Hepatobiliary: There is hypertrophy of the lateral segment of left hepatic lobe and caudate lobe of liver. The contour the liver appears irregular and nodular. Heterogeneous appearance of the liver parenchyma with numerous tiny low-density foci are scattered throughout which measure up to 7 mm. These are new when compared with 02/21/2014. stones identified within the gallbladder. No significant gallbladder wall thickening  or bile duct dilatation. Pancreas: Unremarkable. No pancreatic ductal dilatation or surrounding inflammatory changes. Spleen: Normal in size without focal abnormality. Adrenals/Urinary Tract: Adrenal glands are unremarkable. Kidneys are normal, without renal calculi, focal lesion, or hydronephrosis. Bladder is unremarkable. Stomach/Bowel: Stomach appears normal. No small bowel wall thickening, inflammation or distension. The appendix is visualized and is unremarkable. There is mild edema involving the colon from the cecum to the level of the splenic flexure Vascular/Lymphatic: Aortic atherosclerosis. The portal vein appears patent. Esophageal and gastric varices identified. Recanalization of the umbilical vein. No abdominopelvic adenopathy. Reproductive: Prostate is unremarkable. Other: There is a moderate volume of ascites within the abdomen and pelvis. No focal fluid collections identified. Right inguinal hernia contains fat as well as ascites. Musculoskeletal: Subcutaneous edema is identified along both flanks as well as the proximal portions of the lower extremities. No acute or suspicious osseous findings. IMPRESSION: 1. Morphologic features  of the liver compatible with cirrhosis. 2. Stigmata of portal venous hypertension identified including moderate volume of ascites, esophageal and gastric varices and recanalization of the umbilical vein. 3. Heterogeneous appearance of the liver parenchyma with numerous tiny low-density foci scattered throughout which measure up to 7 mm. These are new when compared with 02/21/2014. These are nonspecific and may reflect multiple dysplastic nodules. Consider follow-up imaging with nonemergent MRI of the liver without and with contrast material for further characterization 4. Gallstones. 5. Mild edema involving the colon from the cecum to the level of the splenic flexure. This is nonspecific and may be related to underlying liver disease. 6. Right inguinal hernia contains fat as well as ascites. 7. Subcutaneous edema is identified along both flanks as well as the proximal portions of the lower extremities. 8.  Aortic Atherosclerosis (ICD10-I70.0). Electronically Signed   By: Kerby Moors M.D.   On: 08/02/2022 13:38  [2 weeks]  Assessment:   Shawn Velazquez. Shawn Velazquez 57 year old male with history of alcohol abuse, elastography in Sept 2023 with fatty liver, presenting now to the ED with week long history of SOB, weakness, lower extremity edema, and abdominal distension, found to have cirrhosis on CT this admission with moderate volume ascites, esophageal and gastric varices, possibly dysplastic hepatic nodules with recommendations for MRI non-urgently.     Decompensated cirrhosis:  With ascites/anasarca and evidence of esophageal and gastric varices per CT this admission. No prior EGD. MELD 3.0 is 21. Paracentesis Monday yielding 3.5 L. PMNs less than 250.  Gram stain negative.  Culture with no growth and acid-fast smear pending.  On lasix '40mg'$  and spironolactone '100mg'$  daily, both started yesterday. Cr is normal. K improved, however, sodium down to 131 from 134. Electrolytes will need to be monitored closely. No signs  of HE at this time. He will need close outpatient following of his cirrhosis outpatient EGD to assess varices.    Alcoholic hepatitis:  DF 37.1 Tuesday, up from 35.8 Monday, started on prednisolone, today is day 3. Will need to calculate [place] score on 2/5 to monitor for steroid responder.    Liver lesions:  New numerous tiny low-density foci scattered throughout liver parenchyma noted on CT this admission, may reflect multiple dysplastic nodules. AFP normal at 3.9. MRI ordered by hospitalist yesterday more consistent with micronodular liver with multiple presumed regenerative nodules, no washout or aggressive appearing lesions.      Plan:   Continue diuretics. Continue prednisolone, complete '40mg'$  daily for 28 days, then taper over 14 additional days. He will need updated labs 08/09/22 to calculate [facility]  score.  2g Na diet.  ETOH cessation.  Outpatient EGD for varices.  GI signing off. Call with questions.    LOS: 3 days   Shawn Velazquez. Shawn Velazquez [facility] [phone] 2/1/20248:25 AM

## 2022-08-05 NOTE — Inpatient Diabetes Management (Signed)
Inpatient Diabetes Program Recommendations  AACE/ADA: New Consensus Statement on Inpatient Glycemic Control (2015)  Target Ranges:  Prepandial:   less than 140 mg/dL      Peak postprandial:   less than 180 mg/dL (1-2 hours)      Critically ill patients:  140 - 180 mg/dL   Lab Results  Component Value Date   GLUCAP 200 (H) 08/05/2022   HGBA1C 5.1 08/03/2022    Review of Glycemic Control  Latest Reference Range & Units 08/05/22 03:20 08/05/22 07:23  Glucose-Capillary 70 - 99 mg/dL 233 (H) 200 (H)  (H): Data is abnormally high Diabetes history: DM2 Outpatient Diabetes medications: Lantus 35 units qd, Humalog 8-14 units tid, Metformin 500 mg bid Current orders for Inpatient glycemic control: Novolog 0-9 units tid , 0-5 units hs correction, Novolog 4 units TID, Semglee 12 units QD Prednisone 40 mg qd   Inpatient Diabetes Program Recommendations:   If to remain inpatient consider: Increasing Semglee 22 units QD  Thanks, Bronson Curb, MSN, RNC-OB Diabetes Coordinator [phone] (8a-5p)

## 2022-08-05 NOTE — Discharge Instructions (Signed)

## 2022-08-05 NOTE — Telephone Encounter (Signed)
Patient needs follow up ov in 2-3 weeks. Ena Dawley, or myself.  He needs labs on 08/09/22: CMET, PT/INR

## 2022-08-06 ENCOUNTER — Telehealth: Payer: Self-pay | Admitting: *Deleted

## 2022-08-06 ENCOUNTER — Encounter: Payer: Self-pay | Admitting: *Deleted

## 2022-08-06 NOTE — Patient Outreach (Signed)
Care Coordination [facility] Note Transition Care Management Follow-up Telephone Call Date of discharge and from where: Thursday, 08/05/22 [facility]; anasarca; liver cirrhosis/ swelling secondary to ETOH How have you been since you were released from the hospital? "I am doing okay so far; I have not yet picked up my new prescriptions because they were not ready at the pharmacy yesterday, but I am going to pick them up today; I will take the med list to the pharmacy with me as you have suggested to make sure I have everything I am supposed to.  I would like to talk to the social worker about how to apply for disability, and yes, if you could have them call me to schedule a follow up call from the nurse, that would be great.  I just want to make sure I am doing everything like I am supposed to.  Right now, I am still working, but I hope to go on disability soon, I just need help understanding what I am supposed to do to get on disability" Any questions or concerns? Yes -- no hospital follow up PCP visit scheduled: facilitated scheduling of this appointment in real-time with scheduling care guide team-- scheduled 08/18/22 at 3:00 pm around patient preference/ current work schedule; verified patient has scheduled specialist provider office visits scheduled as recommended post-hospital discharge -- needs assistance in understanding how to complete/ perform disability application- sent request to scheduling care guide to schedule telephone visits with LCSW and RN CM Care Coordinator  Items Reviewed: Did the pt receive and understand the discharge instructions provided? Yes  thoroughly reviewed all aspects of AVS discharge instructions with patient: he verbalizes good understanding of same, but requires reinforcement around provider office visits/ medication changes: reinforcement provided today, request sent to scheduling care guide to facilitate scheduling follow up RN CM Care Coordinator telephone visit to  reinforce post- TOC call today Medications obtained and verified? Yes  Other? No  Any new allergies since your discharge? No  Dietary orders reviewed? Yes Do you have support at home? Yes  reports he is independent in self-care activities; sister and girlfriend assist as/ if indicated  Home Care and Equipment/Supplies: Were home health services ordered? no If so, what is the name of the agency? N/A  Has the agency set up a time to come to the patient's home? not applicable Were any new equipment or medical supplies ordered?  No What is the name of the medical supply agency? N/A Were you able to get the supplies/equipment? not applicable Do you have any questions related to the use of the equipment or supplies? No N/A  Functional Questionnaire: (I = Independent and D = Dependent) ADLs: I  Bathing/Dressing- I  Meal Prep- I  Eating- I  Maintaining continence- I  Transferring/Ambulation- I  Managing Meds- I  Follow up appointments reviewed:  PCP Hospital f/u appt confirmed? Yes  Scheduled to see PCP on 08/18/22 @ 3:00 pm-- facilitated scheduling of this visit in real-time through care coordination with scheduling care guide team [facility] f/u appt confirmed? Yes  Scheduled to see hematology provider on 08/18/22 @ 9:00 am; gastroenterology provider on 08/24/22 at 1:00 pm: verified this was recommended time frame per discharge notes Are transportation arrangements needed? No  If their condition worsens, is the pt aware to call PCP or go to the Emergency Dept.? Yes Was the patient provided with contact information for the PCP's office or ED? No patient declined- reports already has contact information for care  providers; confirmed with patient that numbers are included on AVS discharge instructions Was to pt encouraged to call back with questions or concerns? Yes  SDOH assessments and interventions completed:   Yes SDOH Interventions Today    Flowsheet Row Most Recent Value   SDOH Interventions   Food Insecurity Interventions Intervention Not Indicated  Transportation Interventions Intervention Not Indicated  [drives self,  girlfriend or sister assists if indicated]      Interventions Today    Flowsheet Row Most Recent Value  Chronic Disease Discussed/Reviewed   Chronic disease discussed/reviewed during today's visit Other  [cirrhosis]  General Interventions   General Interventions Discussed/Reviewed General Interventions Discussed, Referral to Nurse, Doctor Visits  [facility] hospital follow up PCP office visit within 2 weeks of discharge in real time,  confirmed patient has specialists visits scheduled]  Doctor Visits Discussed/Reviewed Doctor Visits Discussed  [thoroughly reviewed all scheduled provider office visits with patient today,  facilitated securing PCP hospital follow up visit in real-time]  Nutrition Interventions   Nutrition Discussed/Reviewed Nutrition Discussed  Pharmacy Interventions   Pharmacy Dicussed/Reviewed Pharmacy Topics Discussed  [full medication review completed,  confirmed patient self-manages medications,  denies questions/ concerns around meds,  to pick up newly prescribed meds today]       Care Coordination Interventions:  PCP follow up appointment requested Referred for Care Coordination Services:  RN Care Coordinator Sent request to scheduling care guide team to facilitate follow up telephone visit for LCSW    Encounter Outcome:  Pt. Visit Completed    Oneta Rack, RN, BSN, CCRN Alumnus RN CM Care Coordination/ Transition of [place] Management [phone]: direct office

## 2022-08-06 NOTE — Progress Notes (Signed)
  Care Coordination  Outreach Note  08/06/2022 Name: Shawn Velazquez MRN: [id number] DOB: 02/12/1966   Care Coordination Outreach Attempts: An unsuccessful telephone outreach was attempted today to offer the patient information about available care coordination services as a benefit of their health plan.   Follow Up Plan:  Additional outreach attempts will be made to offer the patient care coordination information and services.   Encounter Outcome:  Pt. Request to Call [place], [place] Direct Dial: [phone]

## 2022-08-06 NOTE — Progress Notes (Signed)
  Care Coordination  Note  08/06/2022 Name: Shawn Velazquez MRN: [id number] DOB: 04/07/1966  [name] is a 57 y.o. year old primary care patient of Celene Squibb, MD.   Follow up plan: Hospital Follow Up appointment scheduled with (Dr Court Joy) on (08/18/2022) at (3pm).  [place], [place] Direct Dial: [phone]

## 2022-08-07 LAB — CULTURE, BODY FLUID W GRAM STAIN -BOTTLE
Culture: NO GROWTH
Special Requests: ADEQUATE

## 2022-08-09 DIAGNOSIS — K7460 Unspecified cirrhosis of liver: Secondary | ICD-10-CM | POA: Diagnosis not present

## 2022-08-09 DIAGNOSIS — F1010 Alcohol abuse, uncomplicated: Secondary | ICD-10-CM | POA: Diagnosis not present

## 2022-08-09 DIAGNOSIS — E1165 Type 2 diabetes mellitus with hyperglycemia: Secondary | ICD-10-CM | POA: Diagnosis not present

## 2022-08-09 DIAGNOSIS — K7469 Other cirrhosis of liver: Secondary | ICD-10-CM | POA: Diagnosis not present

## 2022-08-09 DIAGNOSIS — I10 Essential (primary) hypertension: Secondary | ICD-10-CM | POA: Diagnosis not present

## 2022-08-09 DIAGNOSIS — E876 Hypokalemia: Secondary | ICD-10-CM | POA: Diagnosis not present

## 2022-08-17 NOTE — Progress Notes (Unsigned)
[name], [place] [address]   CLINIC:  Medical Oncology/Hematology  CONSULT NOTE  Patient Care Team: Celene Squibb, MD as PCP - General (Internal Medicine)  CHIEF COMPLAINTS/PURPOSE OF CONSULTATION:  Anemia and thrombocytopenia   HISTORY OF PRESENTING ILLNESS:   Shawn Velazquez 57 y.o. male is here at the request of NP Cecile Sheerer for evaluation and treatment of anemia and thrombocytopenia.  Labs sent by primary care NP (07/22/2022) show mild anemia with Hgb 12.3/MCV 97; platelets are 104.  Prior CBC (06/10/2022) showed Hgb 14.0/MCV 100 and platelets 107..  Labs from 03/03/2022 show Hgb 14.2/MCV 99 and platelets 117.  He has normal creatinine 0.99.   Patient was hospitalized from 08/02/2022 through 08/05/2022 and was diagnosed with decompensated liver cirrhosis requiring paracentesis.  CT abdomen/pelvis (08/02/2022) showed nodular liver consistent with cirrhosis as well as stigmata of portal venous hypertension including moderate volume ascites, esophageal and gastric varices, and recanalization of the umbilical vein; there were some scattered liver lesions noted, but MRI liver appeared benign.  Splenomegaly enlarged on MRI measuring about 14.4 cm.  He follows with [facility].  CBC on date of hospital discharge (08/05/2022) showed Hgb 11.1/MCV 104.3, platelets 97; he also has an elevation in white blood cells but was placed on oral steroids during hospitalization.  He denies any epistaxis, hematemesis, hematochezia, or melena.  He admits to easy bruising but denies petechial rash.  No B symptoms, masses, or lymphadenopathy.  He reports significant fatigue, ice pica, lightheadedness, dyspnea on exertion.  No history of blood transfusion.  He does not take any blood thinners, but takes 600 to 800 mg of ibuprofen 3 times a week.  He takes omeprazole daily.  He takes vitamin B1 and Centrum daily vitamin.  No history of autoimmune or  connective tissue disease; no recent infections; he does not drink tonic water, eat excessive walnuts, or consume quinine containing substances.  He had colonoscopy on 04/05/2022 with nonbleeding internal hemorrhoids, polyps x 6, otherwise normal.  He reports EGD 30+ years ago.  Past medical history includes liver cirrhosis, Vertigo, sleep apnea, psoriasis, hypertension, GERD, diabetes, chronic back pain, and anxiety/depression.  He works for [employer], but has not been back to work following his hospitalization for decompensated liver cirrhosis.  He reports history of heavy alcohol consumption for over 30 years, but has cut back to 1 to 2 cans of beer each week after his hospital stay.  He reports previous addiction to hydrocodone pills, but these were discontinued by his PCP at the patient's request.  He currently smokes 0.5 PPD cigarettes; he has been smoking 0.25-0.5 PPD since age 88, but had previously quit for about 10 years.  He denies any family history of blood problems.  Maternal grandfather had lung cancer.  His father is deceased secondary to mesothelioma.  MEDICAL HISTORY:  Past Medical History:  Diagnosis Date   Anxiety    Arthritis    affects hands, shoulder, neck, knees, hips, ankles, toes   Chronic back pain    Diabetes mellitus    diet controlled   Eczema    Fatty liver, alcoholic    GERD (gastroesophageal reflux disease)    Glaucoma    slight case   Hypertension    Psoriasis    Sleep apnea    Substance abuse ([place])    Vertigo 09/11/2017   Wears glasses    SURGICAL HISTORY: Past Surgical History:  Procedure Laterality Date  COLONOSCOPY WITH PROPOFOL N/A 04/05/2022   Procedure: COLONOSCOPY WITH PROPOFOL;  Surgeon: [name], DO;  [facility];  Service: Endoscopy;  Laterality: N/A;  10:45 AM,unable to reach pt to move up   [place]     POLYPECTOMY  04/05/2022   Procedure: POLYPECTOMY;  Surgeon: [name], DO;   Location[facility];  Service: Endoscopy;;   POSTERIOR CERVICAL LAMINECTOMY  04/21/2012   Procedure: POSTERIOR CERVICAL LAMINECTOMY;  Surgeon: Elaina Hoops, MD;  Location: [facility];  Service: Neurosurgery;  Laterality: Left;  Posterior Cervical laminectomy/foraminotomy and diskectomy, left cervical seven-thoracic one   ROTATOR CUFF REPAIR     THORACIC DISCECTOMY Left 04/03/2014   Procedure: Left Thoracic Seven to Eight, Thoracic Eight to Nine Thoracic Discectomy ;  Surgeon: Elaina Hoops, MD;  Location: [place] [facility];  Service: Neurosurgery;  Laterality: Left;    SOCIAL HISTORY: Social History   Socioeconomic History   Marital status: Divorced    Spouse name: Not on file   Number of children: Not on file   Years of education: Not on file   Highest education level: Not on file  Occupational History    Comment: Truck to storage  Tobacco Use   Smoking status: Former    Packs/day: 0.25    Years: 40.00    Total pack years: 10.00    Types: E-cigarettes, Cigarettes    Quit date: 05/09/2022    Years since quitting: 0.2   Smokeless tobacco: Never   Tobacco comments:    pt trying to quit on his own  Vaping Use   Vaping Use: Former  Substance and Sexual Activity   Alcohol use: Yes    Alcohol/week: 12.0 standard drinks of alcohol    Types: 12 Cans of beer per week    Comment: Either Smirnoff Smash-4 per day or Fireball-10 airplane bottles in a day   Drug use: No   Sexual activity: Yes    Birth control/protection: None  Other Topics Concern   Not on file  Social History Narrative   Not on file   Social Determinants of Health   Financial Resource Strain: Not on file  Food Insecurity: No Food Insecurity (08/06/2022)   Hunger Vital Sign    Worried About Running Out of Food in the Last Year: Never true    Ran Out of Food in the Last Year: Never true  Transportation Needs: No Transportation Needs (08/06/2022)   PRAPARE - Hydrologist (Medical): No     Lack of Transportation (Non-Medical): No  Physical Activity: Not on file  Stress: Not on file  Social Connections: Not on file  Intimate Partner Violence: Not on file    FAMILY HISTORY: Family History  Problem Relation Age of Onset   Diabetes Father    Cancer - Lung Father    Hypertension Other    Cancer - Lung Other     ALLERGIES:  is allergic to neosporin + pain relief max st [neomy-bacit-polymyx-pramoxine].  MEDICATIONS:  Current Outpatient Medications  Medication Sig Dispense Refill   acetaminophen (TYLENOL) 325 MG tablet Take 650 mg by mouth as needed for mild pain, headache or moderate pain.     albuterol (VENTOLIN HFA) 108 (90 Base) MCG/ACT inhaler INHALE 2 PUFFS INTO THE LUNGS EVERY 6 HOURS AS NEEDED FOR WHEEZING OR SHORTNESS OF BREATH 20.1 g 0   BELSOMRA 15 MG TABS Take 1 tablet by mouth daily.  Continuous Blood Gluc Sensor (DEXCOM G7 SENSOR) MISC Inject 1 application. into the skin as directed. Change sensor every 10 days as directed. 6 each 3   FLUoxetine (PROZAC) 10 MG capsule TAKE 4 CAPSULES BY MOUTH EVERY DAY 260 capsule 0   furosemide (LASIX) 40 MG tablet Take 1 tablet (40 mg total) by mouth daily. 30 tablet 1   glucose blood test strip Use as instructed to monitor glucose 4 times daily 100 each 12   insulin glargine (LANTUS SOLOSTAR) 100 UNIT/ML Solostar Pen Inject 40 Units into the skin at bedtime. 30 mL 3   insulin lispro (HUMALOG KWIKPEN) 100 UNIT/ML KwikPen Inject 8-14 Units into the skin 3 (three) times daily. 40 mL 3   Insulin Pen Needle (PEN NEEDLES) 32G X 4 MM MISC 1 each by Does not apply route in the morning, at noon, in the evening, and at bedtime. Use to inject insulin 4 times daily 200 each 3   metFORMIN (GLUCOPHAGE-XR) 500 MG 24 hr tablet Take 2 tablets (1,000 mg total) by mouth 2 (two) times daily with a meal. 180 tablet 1   montelukast (SINGULAIR) 10 MG tablet TAKE 1 TABLET(10 MG) BY MOUTH AT BEDTIME (Patient not taking: Reported on 08/06/2022) 30  tablet 3   Multiple Vitamin (MULTIVITAMIN) tablet Take 1 tablet by mouth daily.     omeprazole (PRILOSEC OTC) 20 MG tablet Take 20 mg by mouth every other day.     prednisoLONE (PRELONE) 15 MG/5ML SOLN Take 13.3 mLs (40 mg total) by mouth daily before breakfast for 25 days, THEN 8.3 mLs (25 mg total) daily before breakfast for 7 days, THEN 5 mLs (15 mg total) daily before breakfast for 7 days, THEN 3 mLs (9 mg total) daily before breakfast for 5 days. 450 mL 0   spironolactone (ALDACTONE) 100 MG tablet Take 1 tablet (100 mg total) by mouth daily. 30 tablet 1   thiamine (VITAMIN B-1) 100 MG tablet Take 1 tablet (100 mg total) by mouth daily. 30 tablet 1   No current facility-administered medications for this visit.    REVIEW OF SYSTEMS:    Review of Systems  Constitutional:  Positive for fatigue. Negative for appetite change, chills, diaphoresis, fever and unexpected weight change.  HENT:   Negative for lump/mass and nosebleeds.   Eyes:  Negative for eye problems.  Respiratory:  Positive for cough and shortness of breath (with exertion). Negative for hemoptysis.   Cardiovascular:  Negative for chest pain, leg swelling and palpitations.  Gastrointestinal:  Positive for nausea and vomiting. Negative for abdominal pain, blood in stool, constipation and diarrhea.  Genitourinary:  Negative for hematuria.   Skin: Negative.   Neurological:  Positive for dizziness and numbness. Negative for headaches and light-headedness.  Hematological:  Does not bruise/bleed easily.  Psychiatric/Behavioral:  Positive for depression and sleep disturbance.       PHYSICAL EXAMINATION:   ECOG PERFORMANCE STATUS: 1 - Symptomatic but completely ambulatory  There were no vitals filed for this visit. There were no vitals filed for this visit.  Physical Exam Constitutional:      Appearance: Normal appearance. He is obese.  HENT:     Head: Normocephalic and atraumatic.     Mouth/Throat:     Mouth: Mucous  membranes are moist.  Eyes:     Extraocular Movements: Extraocular movements intact.     Pupils: Pupils are equal, round, and reactive to light.  Cardiovascular:     Rate and Rhythm: Regular rhythm. Tachycardia present.  Pulses: Normal pulses.     Heart sounds: Normal heart sounds.  Pulmonary:     Effort: Pulmonary effort is normal.     Breath sounds: Normal breath sounds.  Abdominal:     General: Bowel sounds are normal.     Palpations: Abdomen is soft.     Tenderness: There is no abdominal tenderness.  Musculoskeletal:        General: No swelling.     Right lower leg: No edema.     Left lower leg: No edema.  Lymphadenopathy:     Cervical: No cervical adenopathy.  Skin:    General: Skin is warm and dry.  Neurological:     General: No focal deficit present.     Mental Status: He is alert and oriented to person, place, and time.  Psychiatric:        Mood and Affect: Mood normal.        Behavior: Behavior normal.     LABORATORY DATA:  I have reviewed the data as listed Recent Results (from the past 2160 hour(s))  AFP tumor marker     Status: None   Collection Time: 08/02/22 11:35 AM  Result Value Ref Range   AFP, Serum, Tumor Marker 3.9 0.0 - 8.4 ng/mL    Comment: (NOTE) Roche Diagnostics Electrochemiluminescence Immunoassay (ECLIA) Values obtained with different assay methods or kits cannot be used interchangeably.  Results cannot be interpreted as absolute evidence of the presence or absence of malignant disease. This test is not interpretable in pregnant females. Performed At: [facility] [place], [place] [id number] Rush Farmer MD [id number]   Lipase, blood     Status: Abnormal   Collection Time: 08/02/22 12:24 PM  Result Value Ref Range   Lipase 68 (H) 11 - 51 U/L    Comment: Performed at [facility], [address], [place], [place] [address]  Comprehensive metabolic panel     Status: Abnormal   Collection Time: 08/02/22 12:24  PM  Result Value Ref Range   Sodium 134 (L) 135 - 145 mmol/L   Potassium 2.4 (LL) 3.5 - 5.1 mmol/L    Comment: CRITICAL RESULT CALLED TO, READ BACK BY AND VERIFIED WITH: DEANNA FOWLER @ 1246 ON 08/02/22 C VARNER    Chloride 96 (L) 98 - 111 mmol/L   CO2 28 22 - 32 mmol/L   Glucose, Bld 155 (H) 70 - 99 mg/dL    Comment: Glucose reference range applies only to samples taken after fasting for at least 8 hours.   BUN 5 (L) 6 - 20 mg/dL   Creatinine, Ser 0.87 0.61 - 1.24 mg/dL   Calcium 8.4 (L) 8.9 - 10.3 mg/dL   Total Protein 6.8 6.5 - 8.1 g/dL   Albumin 2.6 (L) 3.5 - 5.0 g/dL   AST 248 (H) 15 - 41 U/L   ALT 77 (H) 0 - 44 U/L   Alkaline Phosphatase 262 (H) 38 - 126 U/L   Total Bilirubin 3.1 (H) 0.3 - 1.2 mg/dL   GFR, Estimated >60 >60 mL/min    Comment: (NOTE) Calculated using the CKD-EPI Creatinine Equation (2021)    Anion gap 10 5 - 15    Comment: Performed at [facility], [address], [place], [place] [address]  CBC     Status: Abnormal   Collection Time: 08/02/22 12:24 PM  Result Value Ref Range   WBC 9.1 4.0 - 10.5 K/uL   RBC 3.37 (L) 4.22 - 5.81 MIL/uL   Hemoglobin  13.1 13.0 - 17.0 g/dL   HCT 35.0 (L) 39.0 - 52.0 %   MCV 103.9 (H) 80.0 - 100.0 fL   MCH 38.9 (H) 26.0 - 34.0 pg   MCHC 37.4 (H) 30.0 - 36.0 g/dL   RDW 13.2 11.5 - 15.5 %   Platelets 158 150 - 400 K/uL   nRBC 0.0 0.0 - 0.2 %    Comment: Performed at [facility], [address], [place], [place] [address]  Brain natriuretic peptide     Status: None   Collection Time: 08/02/22 12:24 PM  Result Value Ref Range   B Natriuretic Peptide 96.0 0.0 - 100.0 pg/mL    Comment: Performed at [facility], [address], [place], [place] [address]  Magnesium     Status: None   Collection Time: 08/02/22 12:24 PM  Result Value Ref Range   Magnesium 1.8 1.7 - 2.4 mg/dL    Comment: Performed at [facility], [address], [place], [place] [address]  Protime-INR     Status: Abnormal   Collection Time: 08/02/22 12:24 PM   Result Value Ref Range   Prothrombin Time 20.1 (H) 11.4 - 15.2 seconds   INR 1.7 (H) 0.8 - 1.2    Comment: (NOTE) INR goal varies based on device and disease states. Performed at [facility], [address], [place], [place] [address]   Urinalysis, Routine w reflex microscopic -Urine, Clean Catch     Status: Abnormal   Collection Time: 08/02/22  1:04 PM  Result Value Ref Range   Color, Urine YELLOW YELLOW   APPearance CLEAR CLEAR   Specific Gravity, Urine 1.004 (L) 1.005 - 1.030   pH 7.0 5.0 - 8.0   Glucose, UA NEGATIVE NEGATIVE mg/dL   Hgb urine dipstick NEGATIVE NEGATIVE   Bilirubin Urine NEGATIVE NEGATIVE   Ketones, ur NEGATIVE NEGATIVE mg/dL   Protein, ur NEGATIVE NEGATIVE mg/dL   Nitrite NEGATIVE NEGATIVE   Leukocytes,Ua NEGATIVE NEGATIVE    Comment: Performed at [facility], [address], [place], [place] [address]  Acid Fast Smear (AFB)     Status: None   Collection Time: 08/02/22  4:03 PM   Specimen: Peritoneal Cavity; Body Fluid  Result Value Ref Range   AFB Specimen Processing Concentration    Acid Fast Smear Negative     Comment: (NOTE) Performed At: [facility] [place], [place] [id number] Rush Farmer MD [id number]    Source (AFB) PERITONEAL     Comment: Performed at [facility], [address], [place], [place] [address]  Body fluid cell count with differential     Status: Abnormal   Collection Time: 08/02/22  4:03 PM  Result Value Ref Range   Fluid Type-FCT Peritoneal    Color, Fluid YELLOW (A) YELLOW   Appearance, Fluid CLEAR CLEAR   Total Nucleated Cell Count, Fluid 174 0 - 1,000 cu mm   Neutrophil Count, Fluid 12 0 - 25 %   Lymphs, Fluid 69 %   Monocyte-Macrophage-Serous Fluid 19 (L) 50 - 90 %   Eos, Fluid 0 %   Other Cells, Fluid OTHER CELLS IDENTIFIED AS MESOTHELIAL CELLS %    Comment: Performed at [facility], [address], [place], [place] [address]  Culture, body fluid w Gram Stain-bottle     Status: None    Collection Time: 08/02/22  4:03 PM   Specimen: Peritoneal Washings  Result Value Ref Range   Specimen Description PERITONEAL    Special Requests  BOTTLES DRAWN AEROBIC AND ANAEROBIC Blood Culture adequate volume   Culture      NO GROWTH 5 DAYS Performed at [facility], [address], [place], [place] [address]    Report Status 08/07/2022 FINAL   Gram stain     Status: None   Collection Time: 08/02/22  4:03 PM   Specimen: Peritoneal Washings  Result Value Ref Range   Specimen Description PERITONEAL    Special Requests NONE    Gram Stain      NO ORGANISMS SEEN WBC PRESENT, PREDOMINANTLY MONONUCLEAR CYTOSPIN SMEAR Performed at [facility], [address] [place], [place] [address]    Report Status 08/02/2022 FINAL   Cytology - Non PAP; ascites     Status: None   Collection Time: 08/02/22  4:04 PM  Result Value Ref Range   CYTOLOGY - NON GYN      CYTOLOGY - NON PAP CASE: [id number] PATIENT: Shawn Velazquez Non-Gynecological Cytology Report     Clinical History: Hx of fatty liver; alcoholic cirrhosis of liver with ascites Specimen Submitted:  A. ASCITES, PARACENTESIS:   FINAL MICROSCOPIC DIAGNOSIS: - No malignant cells identified  SPECIMEN ADEQUACY: Satisfactory for evaluation  DIAGNOSTIC COMMENTS: Inflammation present.  GROSS: Received is/are 1000 ccs of clear yellow fluid. (CM:cm) Prepared: Smears:  0 Concentration Method (Thin Prep):  1 Cell Block:  1 Additional Studies:  n/a     Final Diagnosis performed by Tilford Pillar DO.   Electronically signed 08/04/2022 Technical component performed at [facility], [place] [address], [place], [place] [address].  Professional component performed at [facility], [place] [address], [place], [place] [address].  Immunohistochemistry Technical component (if applicable) was performed at [facility] . [address], [place], [place],  [address].    IMMUNOHISTOCHEMISTRY DISCLAIMER (if applicable): Some of these immunohistochemical stains may have been developed and the performance characteristics determine by [facility]. Some may not have been cleared or approved by the U.S. Food and Drug Administration. The FDA has determined that such clearance or approval is not necessary. This test is used for clinical purposes. It should not be regarded as investigational or for research. This laboratory is certified under the [place] (CLIA-88) as qualified to perform high complexity clinical laboratory testing.  The controls stained appropriately.   Glucose, capillary     Status: Abnormal   Collection Time: 08/02/22 10:05 PM  Result Value Ref Range   Glucose-Capillary 188 (H) 70 - 99 mg/dL    Comment: Glucose reference range applies only to samples taken after fasting for at least 8 hours.  Comprehensive metabolic panel     Status: Abnormal   Collection Time: 08/03/22  4:52 AM  Result Value Ref Range   Sodium 134 (L) 135 - 145 mmol/L   Potassium 3.0 (L) 3.5 - 5.1 mmol/L    Comment: DELTA CHECK NOTED   Chloride 100 98 - 111 mmol/L   CO2 26 22 - 32 mmol/L   Glucose, Bld 155 (H) 70 - 99 mg/dL    Comment: Glucose reference range applies only to samples taken after fasting for at least 8 hours.   BUN 6 6 - 20 mg/dL   Creatinine, Ser 0.78 0.61 - 1.24 mg/dL   Calcium 8.0 (L) 8.9 - 10.3 mg/dL   Total Protein 5.9 (L) 6.5 - 8.1 g/dL   Albumin 2.5 (L) 3.5 - 5.0 g/dL   AST 206 (H) 15 - 41 U/L  ALT 61 (H) 0 - 44 U/L   Alkaline Phosphatase 232 (H) 38 - 126 U/L   Total Bilirubin 3.3 (H) 0.3 - 1.2 mg/dL   GFR, Estimated >60 >60 mL/min    Comment: (NOTE) Calculated using the CKD-EPI Creatinine Equation (2021)    Anion gap 8 5 - 15    Comment: Performed at [facility], [address] [place], [place] [address]  Protime-INR     Status: Abnormal   Collection Time: 08/03/22  4:52 AM  Result  Value Ref Range   Prothrombin Time 21.0 (H) 11.4 - 15.2 seconds   INR 1.8 (H) 0.8 - 1.2    Comment: (NOTE) INR goal varies based on device and disease states. Performed at [facility], [address], [place], [place] [address]   CBC     Status: Abnormal   Collection Time: 08/03/22  4:52 AM  Result Value Ref Range   WBC 9.1 4.0 - 10.5 K/uL   RBC 2.93 (L) 4.22 - 5.81 MIL/uL   Hemoglobin 11.3 (L) 13.0 - 17.0 g/dL   HCT 30.5 (L) 39.0 - 52.0 %   MCV 104.1 (H) 80.0 - 100.0 fL   MCH 38.6 (H) 26.0 - 34.0 pg   MCHC 37.0 (H) 30.0 - 36.0 g/dL   RDW 13.4 11.5 - 15.5 %   Platelets 111 (L) 150 - 400 K/uL   nRBC 0.0 0.0 - 0.2 %    Comment: Performed at [facility], [address], [place], [place] [address]  TSH     Status: None   Collection Time: 08/03/22  4:52 AM  Result Value Ref Range   TSH 1.256 0.350 - 4.500 uIU/mL    Comment: Performed by a 3rd Generation assay with a functional sensitivity of <=0.01 uIU/mL. Performed at [facility], [address], [place],  [address]   Hemoglobin A1c     Status: None   Collection Time: 08/03/22  4:52 AM  Result Value Ref Range   Hgb A1c MFr Bld 5.1 4.8 - 5.6 %    Comment: (NOTE) Pre diabetes:          5.7%-6.4%  Diabetes:              >6.4%  Glycemic control for   <7.0% adults with diabetes    Mean Plasma Glucose 99.67 mg/dL    Comment: Performed at [facility] [address] [place], [place] [address]  HIV Antibody (routine testing w rflx)     Status: None   Collection Time: 08/03/22  4:52 AM  Result Value Ref Range   HIV Screen 4th Generation wRfx Non Reactive Non Reactive    Comment: Performed at [facility], [place] [address], [place], [place] [address]  Glucose, capillary     Status: Abnormal   Collection Time: 08/03/22  8:00 AM  Result Value Ref Range   Glucose-Capillary 138 (H) 70 - 99 mg/dL    Comment: Glucose reference range applies only to samples taken after fasting for at least 8 hours.  ECHOCARDIOGRAM  COMPLETE     Status: None   Collection Time: 08/03/22  9:27 AM  Result Value Ref Range   Weight 3,188.73 oz   Height 66 in   BP 137/77 mmHg   Area-P 1/2 4.31 cm2   S' Lateral 2.90 cm   Est EF 60 - 65%   Glucose, capillary     Status: Abnormal   Collection Time: 08/03/22 12:15 PM  Result Value Ref  Range   Glucose-Capillary 269 (H) 70 - 99 mg/dL    Comment: Glucose reference range applies only to samples taken after fasting for at least 8 hours.  Glucose, capillary     Status: Abnormal   Collection Time: 08/03/22  4:22 PM  Result Value Ref Range   Glucose-Capillary 275 (H) 70 - 99 mg/dL    Comment: Glucose reference range applies only to samples taken after fasting for at least 8 hours.  Glucose, capillary     Status: Abnormal   Collection Time: 08/03/22  9:10 PM  Result Value Ref Range   Glucose-Capillary 335 (H) 70 - 99 mg/dL    Comment: Glucose reference range applies only to samples taken after fasting for at least 8 hours.  Comprehensive metabolic panel     Status: Abnormal   Collection Time: 08/04/22  5:04 AM  Result Value Ref Range   Sodium 131 (L) 135 - 145 mmol/L   Potassium 3.3 (L) 3.5 - 5.1 mmol/L   Chloride 98 98 - 111 mmol/L   CO2 26 22 - 32 mmol/L   Glucose, Bld 253 (H) 70 - 99 mg/dL    Comment: Glucose reference range applies only to samples taken after fasting for at least 8 hours.   BUN 10 6 - 20 mg/dL   Creatinine, Ser 0.76 0.61 - 1.24 mg/dL   Calcium 7.9 (L) 8.9 - 10.3 mg/dL   Total Protein 5.7 (L) 6.5 - 8.1 g/dL   Albumin 2.4 (L) 3.5 - 5.0 g/dL   AST 142 (H) 15 - 41 U/L   ALT 52 (H) 0 - 44 U/L   Alkaline Phosphatase 221 (H) 38 - 126 U/L   Total Bilirubin 3.0 (H) 0.3 - 1.2 mg/dL   GFR, Estimated >60 >60 mL/min    Comment: (NOTE) Calculated using the CKD-EPI Creatinine Equation (2021)    Anion gap 7 5 - 15    Comment: Performed at [facility], [address] [place], [place] [address]  CBC     Status: Abnormal   Collection Time: 08/04/22  5:04 AM   Result Value Ref Range   WBC 11.9 (H) 4.0 - 10.5 K/uL   RBC 2.78 (L) 4.22 - 5.81 MIL/uL   Hemoglobin 10.6 (L) 13.0 - 17.0 g/dL   HCT 29.2 (L) 39.0 - 52.0 %   MCV 105.0 (H) 80.0 - 100.0 fL   MCH 38.1 (H) 26.0 - 34.0 pg   MCHC 36.3 (H) 30.0 - 36.0 g/dL   RDW 13.1 11.5 - 15.5 %   Platelets 97 (L) 150 - 400 K/uL    Comment: SPECIMEN CHECKED FOR CLOTS Immature Platelet Fraction may be clinically indicated, consider ordering this additional test [id number] CONSISTENT WITH PREVIOUS RESULT    nRBC 0.0 0.0 - 0.2 %    Comment: Performed at [facility], [address], [place], [place] [address]  Glucose, capillary     Status: Abnormal   Collection Time: 08/04/22  7:23 AM  Result Value Ref Range   Glucose-Capillary 231 (H) 70 - 99 mg/dL    Comment: Glucose reference range applies only to samples taken after fasting for at least 8 hours.  Glucose, capillary     Status: Abnormal   Collection Time: 08/04/22 11:17 AM  Result Value Ref Range   Glucose-Capillary 262 (H) 70 - 99 mg/dL    Comment: Glucose reference range applies only to samples taken after fasting for at least 8 hours.  Magnesium     Status: Abnormal  Collection Time: 08/04/22 12:24 PM  Result Value Ref Range   Magnesium 1.5 (L) 1.7 - 2.4 mg/dL    Comment: Performed at [facility], [address], [place], [place] [address]  Protime-INR     Status: Abnormal   Collection Time: 08/04/22 12:24 PM  Result Value Ref Range   Prothrombin Time 20.1 (H) 11.4 - 15.2 seconds   INR 1.7 (H) 0.8 - 1.2    Comment: (NOTE) INR goal varies based on device and disease states. Performed at [facility], [address], [place], [place] [address]   Glucose, capillary     Status: Abnormal   Collection Time: 08/04/22  4:18 PM  Result Value Ref Range   Glucose-Capillary 341 (H) 70 - 99 mg/dL    Comment: Glucose reference range applies only to samples taken after fasting for at least 8 hours.   Comment 1 Notify RN    Comment 2 Document in Chart    Glucose, capillary     Status: Abnormal   Collection Time: 08/04/22  9:24 PM  Result Value Ref Range   Glucose-Capillary 304 (H) 70 - 99 mg/dL    Comment: Glucose reference range applies only to samples taken after fasting for at least 8 hours.  Glucose, capillary     Status: Abnormal   Collection Time: 08/05/22  3:20 AM  Result Value Ref Range   Glucose-Capillary 233 (H) 70 - 99 mg/dL    Comment: Glucose reference range applies only to samples taken after fasting for at least 8 hours.  Comprehensive metabolic panel     Status: Abnormal   Collection Time: 08/05/22  5:06 AM  Result Value Ref Range   Sodium 131 (L) 135 - 145 mmol/L   Potassium 4.0 3.5 - 5.1 mmol/L    Comment: DELTA CHECK NOTED   Chloride 98 98 - 111 mmol/L   CO2 25 22 - 32 mmol/L   Glucose, Bld 209 (H) 70 - 99 mg/dL    Comment: Glucose reference range applies only to samples taken after fasting for at least 8 hours.   BUN 11 6 - 20 mg/dL   Creatinine, Ser 0.84 0.61 - 1.24 mg/dL   Calcium 7.8 (L) 8.9 - 10.3 mg/dL   Total Protein 6.0 (L) 6.5 - 8.1 g/dL   Albumin 2.5 (L) 3.5 - 5.0 g/dL   AST 115 (H) 15 - 41 U/L   ALT 50 (H) 0 - 44 U/L   Alkaline Phosphatase 216 (H) 38 - 126 U/L   Total Bilirubin 2.6 (H) 0.3 - 1.2 mg/dL   GFR, Estimated >60 >60 mL/min    Comment: (NOTE) Calculated using the CKD-EPI Creatinine Equation (2021)    Anion gap 8 5 - 15    Comment: Performed at [facility], [address] [place], [place] [address]  CBC with Differential/Platelet     Status: Abnormal   Collection Time: 08/05/22  5:06 AM  Result Value Ref Range   WBC 12.8 (H) 4.0 - 10.5 K/uL   RBC 2.82 (L) 4.22 - 5.81 MIL/uL   Hemoglobin 11.1 (L) 13.0 - 17.0 g/dL   HCT 29.4 (L) 39.0 - 52.0 %   MCV 104.3 (H) 80.0 - 100.0 fL   MCH 39.4 (H) 26.0 - 34.0 pg   MCHC 37.8 (H) 30.0 - 36.0 g/dL   RDW 13.2 11.5 - 15.5 %   Platelets 97 (L) 150 - 400 K/uL    Comment: SPECIMEN CHECKED FOR CLOTS Immature Platelet Fraction may be clinically  indicated, consider ordering this additional test [id number] CONSISTENT WITH PREVIOUS RESULT    nRBC 0.0 0.0 - 0.2 %   Neutrophils Relative % 74 %   Neutro Abs 9.4 (H) 1.7 - 7.7 K/uL   Lymphocytes Relative 16 %   Lymphs Abs 2.1 0.7 - 4.0 K/uL   Monocytes Relative 8 %   Monocytes Absolute 1.1 (H) 0.1 - 1.0 K/uL   Eosinophils Relative 2 %   Eosinophils Absolute 0.2 0.0 - 0.5 K/uL   Basophils Relative 0 %   Basophils Absolute 0.0 0.0 - 0.1 K/uL   Immature Granulocytes 0 %   Abs Immature Granulocytes 0.05 0.00 - 0.07 K/uL    Comment: Performed at [facility], [address] [place], [place] [address]  Magnesium     Status: None   Collection Time: 08/05/22  5:06 AM  Result Value Ref Range   Magnesium 1.9 1.7 - 2.4 mg/dL    Comment: Performed at [facility], [address], [place], [place] [address]  Glucose, capillary     Status: Abnormal   Collection Time: 08/05/22  7:23 AM  Result Value Ref Range   Glucose-Capillary 200 (H) 70 - 99 mg/dL    Comment: Glucose reference range applies only to samples taken after fasting for at least 8 hours.  Protime-INR     Status: Abnormal   Collection Time: 08/05/22  8:51 AM  Result Value Ref Range   Prothrombin Time 19.1 (H) 11.4 - 15.2 seconds   INR 1.6 (H) 0.8 - 1.2    Comment: (NOTE) INR goal varies based on device and disease states. Performed at [facility], [address], [place], [place] [address]   Glucose, capillary     Status: Abnormal   Collection Time: 08/05/22 11:11 AM  Result Value Ref Range   Glucose-Capillary 293 (H) 70 - 99 mg/dL    Comment: Glucose reference range applies only to samples taken after fasting for at least 8 hours.    RADIOGRAPHIC STUDIES: I have personally reviewed the radiological images as listed and agreed with the findings in the report. MR LIVER W WO CONTRAST  Result Date: 08/03/2022 CLINICAL DATA:  Cirrhosis.  [place]. EXAM: MRI ABDOMEN WITHOUT AND WITH CONTRAST TECHNIQUE: Multiplanar multisequence MR  imaging of the abdomen was performed both before and after the administration of intravenous contrast. CONTRAST:  61m GADAVIST GADOBUTROL 1 MMOL/ML IV SOLN COMPARISON:  CT 08/02/2022 and older. FINDINGS: Lower chest: Trace pleural fluid. Breathing motion along several series. Hepatobiliary: There is a micronodular liver. Overall liver is slightly enlarged. The liver is without areas of restricted diffusion. No hypervascular lesions with washout. The tiny areas of low-density on the prior CT scan are again seen as small subcentimeter foci which are dark on T2 foci which do not clearly show abnormal enhancement and may be multiple regenerative nodules. Main portal vein has a diameter of 13 mm of the liver hilum. No biliary ductal dilatation. Gallbladder is nondilated. Gallstones Pancreas: Mild pancreatic atrophy. No pancreatic duct dilatation. No enhancing pancreatic mass. Spleen: Spleen has preserved enhancement. Spleen is enlarged at 14.4 cm in AP length. Adrenals/Urinary Tract: Adrenal glands are preserved. No enhancing renal mass or collecting system dilatation. Stomach/Bowel: Stomach is nondilated. Mild gastric fold thickening. The visualized bowel is nondilated. Normal retrocecal appendix. Vascular/Lymphatic: Normal caliber aorta and IVC. There are scattered varices identified including along the lower esophagus, upper stomach. There are several prominent lymph nodes identified in the upper abdomen including periceliac. Example series 14, image 42  measuring 2.3 x 0.8 cm. Portacaval node series 14, image 43 measuring 20 by 10 mm. These are nonspecific. Other:  Anasarca.  Scattered mesenteric haziness and ascites. Musculoskeletal: Focal L3 spinal hemangioma. IMPRESSION: Evidence of chronic liver disease. Micronodular liver with multiple presumed regenerative nodules. No clear aggressive appearing lesions with restricted diffusion, hypervascularity or washout at this time. Mild ascites. Varices including along the  stomach and lower esophagus. Mild splenic enlargement. The portal vein is patent. Nonspecific upper abdominal nodes. Electronically Signed   By: Jill Side M.D.   On: 08/03/2022 18:40   ECHOCARDIOGRAM COMPLETE  Result Date: 08/03/2022    ECHOCARDIOGRAM REPORT   Patient Name:   Shawn Velazquez Date of Exam: 08/03/2022 Medical Rec #:  [id number]         Height:       66.0 in Accession #:    [id number]        Weight:       199.3 lb Date of Birth:  30-Mar-1966        BSA:          1.997 m Patient Age:    86 years          BP:           137/77 mmHg Patient Gender: M                 HR:           74 bpm. Exam Location:  [name] Procedure: 2D Echo, 3D Echo, Cardiac Doppler, Color Doppler and Strain Analysis Indications:    Dyspnea R06.00  History:        Patient has no prior history of Echocardiogram examinations.                 Signs/Symptoms:Dyspnea and Edema; Risk Factors:Hypertension,                 Diabetes, Dyslipidemia, Former Smoker and Sleep Apnea.  Sonographer:    Greer Pickerel Referring Phys: [phone] DAVID TAT  Sonographer Comments: Image acquisition challenging due to patient body habitus and Image acquisition challenging due to respiratory motion. Global longitudinal strain was attempted. IMPRESSIONS  1. Left ventricular ejection fraction, by estimation, is 60 to 65%. The left ventricle has normal function. The left ventricle has no regional wall motion abnormalities. There is mild left ventricular hypertrophy. Left ventricular diastolic parameters were normal. The average left ventricular global longitudinal strain is -21.1 %. The global longitudinal strain is normal.  2. Right ventricular systolic function was not well visualized. The right ventricular size is normal.  3. Left atrial size was moderately dilated.  4. The mitral valve is grossly normal. No evidence of mitral valve regurgitation. No evidence of mitral stenosis.  5. The aortic valve was not well visualized. There is moderate calcification  of the aortic valve. Aortic valve regurgitation is not visualized. No aortic stenosis is present. Comparison(s): No prior Echocardiogram. FINDINGS  Left Ventricle: Left ventricular ejection fraction, by estimation, is 60 to 65%. The left ventricle has normal function. The left ventricle has no regional wall motion abnormalities. The average left ventricular global longitudinal strain is -21.1 %. The global longitudinal strain is normal. The left ventricular internal cavity size was normal in size. There is mild left ventricular hypertrophy. Left ventricular diastolic parameters were normal. Right Ventricle: The right ventricular size is normal. No increase in right ventricular wall thickness. Right ventricular systolic function was not well visualized. Left Atrium: Left atrial size was  moderately dilated. Right Atrium: Right atrial size was normal in size. Pericardium: There is no evidence of pericardial effusion. Mitral Valve: The mitral valve is grossly normal. No evidence of mitral valve regurgitation. No evidence of mitral valve stenosis. Tricuspid Valve: The tricuspid valve is grossly normal. Tricuspid valve regurgitation is not demonstrated. No evidence of tricuspid stenosis. Aortic Valve: The aortic valve was not well visualized. There is moderate calcification of the aortic valve. Aortic valve regurgitation is not visualized. No aortic stenosis is present. Pulmonic Valve: The pulmonic valve was not well visualized. Pulmonic valve regurgitation is not visualized. No evidence of pulmonic stenosis. Aorta: The aortic root and ascending aorta are structurally normal, with no evidence of dilitation. Venous: The inferior vena cava was not well visualized. IAS/Shunts: The interatrial septum was not well visualized.  LEFT VENTRICLE PLAX 2D LVIDd:         4.60 cm   Diastology LVIDs:         2.90 cm   LV e' medial:    7.77 cm/s LV PW:         1.20 cm   LV E/e' medial:  12.5 LV IVS:        0.90 cm   LV e' lateral:    11.70 cm/s LVOT diam:     2.10 cm   LV E/e' lateral: 8.3 LV SV:         70 LV SV Index:   35        2D Longitudinal Strain LVOT Area:     3.46 cm  2D Strain GLS Avg:     -21.1 %                           3D Volume EF:                          3D EF:        56 %                          LV EDV:       221 ml                          LV ESV:       97 ml                          LV SV:        124 ml RIGHT VENTRICLE RV S prime:     13.50 cm/s TAPSE (M-mode): 1.8 cm LEFT ATRIUM             Index        RIGHT ATRIUM           Index LA diam:        3.80 cm 1.90 cm/m   RA Area:     12.50 cm LA Vol (A2C):   74.4 ml 37.26 ml/m  RA Volume:   21.10 ml  10.57 ml/m LA Vol (A4C):   78.7 ml 39.41 ml/m LA Biplane Vol: 81.5 ml 40.82 ml/m  AORTIC VALVE LVOT Vmax:   106.00 cm/s LVOT Vmean:  68.400 cm/s LVOT VTI:    0.203 m  AORTA Ao Root diam: 3.40 cm Ao Asc diam:  3.00 cm MITRAL VALVE  TRICUSPID VALVE MV Area (PHT): 4.31 cm    TR Peak grad:   4.7 mmHg MV Decel Time: 176 msec    TR Vmax:        108.00 cm/s MV E velocity: 97.40 cm/s MV A velocity: 81.50 cm/s  SHUNTS MV E/A ratio:  1.20        Systemic VTI:  0.20 m                            Systemic Diam: 2.10 cm Vishnu Priya Mallipeddi Electronically signed by Lorelee Cover Mallipeddi Signature Date/Time: 08/03/2022/1:10:55 PM    Final    US Paracentesis  Result Date: 08/02/2022 INDICATION: Ascites EXAM: ULTRASOUND GUIDED DIAGNOSTIC AND THERAPEUTIC PARACENTESIS MEDICATIONS: None. COMPLICATIONS: None immediate. PROCEDURE: Informed written consent was obtained from the patient after a discussion of the risks, benefits and alternatives to treatment. A timeout was performed prior to the initiation of the procedure. Initial ultrasound scanning demonstrates a large amount of ascites within the right lower abdominal quadrant. The right lower abdomen was prepped and draped in the usual sterile fashion. 1% lidocaine was used for local anesthesia. Following this, a 19  gauge, 7-cm, Yueh catheter was introduced. An ultrasound image was saved for documentation purposes. The paracentesis was performed. The catheter was removed and a dressing was applied. The patient tolerated the procedure well without immediate post procedural complication. Patient received post-procedure intravenous albumin; see nursing notes for details. FINDINGS: A total of approximately 3.5 L of yellow ascitic fluid was removed. Samples were sent to the laboratory as requested by the clinical team. IMPRESSION: Successful ultrasound-guided paracentesis yielding 3.5 liters of peritoneal fluid. Electronically Signed   By: Lavonia Dana M.D.   On: 08/02/2022 16:52   DG Chest Portable 1 View  Result Date: 08/02/2022 CLINICAL DATA:  Shortness of breath EXAM: PORTABLE CHEST 1 VIEW COMPARISON:  09/11/2017 FINDINGS: The heart size and mediastinal contours are within normal limits. Mild diffuse interstitial opacity. The visualized skeletal structures are unremarkable. IMPRESSION: Mild diffuse bilateral interstitial pulmonary opacity, suggesting edema or atypical/viral infection. No focal airspace opacity. Electronically Signed   By: Delanna Ahmadi M.D.   On: 08/02/2022 14:27   CT ABDOMEN PELVIS W CONTRAST  Result Date: 08/02/2022 CLINICAL DATA:  Abdominal pain, weakness, leg swelling and abdominal swelling for 1 week. EXAM: CT ABDOMEN AND PELVIS WITH CONTRAST TECHNIQUE: Multidetector CT imaging of the abdomen and pelvis was performed using the standard protocol following bolus administration of intravenous contrast. RADIATION DOSE REDUCTION: This exam was performed according to the departmental dose-optimization program which includes automated exposure control, adjustment of the mA and/or kV according to patient size and/or use of iterative reconstruction technique. CONTRAST:  1110m OMNIPAQUE IOHEXOL 300 MG/ML  SOLN COMPARISON:  02/21/2014 FINDINGS: Lower chest: No acute abnormality. No pleural fluid or airspace  consolidation. Hepatobiliary: There is hypertrophy of the lateral segment of left hepatic lobe and caudate lobe of liver. The contour the liver appears irregular and nodular. Heterogeneous appearance of the liver parenchyma with numerous tiny low-density foci are scattered throughout which measure up to 7 mm. These are new when compared with 02/21/2014. stones identified within the gallbladder. No significant gallbladder wall thickening or bile duct dilatation. Pancreas: Unremarkable. No pancreatic ductal dilatation or surrounding inflammatory changes. Spleen: Normal in size without focal abnormality. Adrenals/Urinary Tract: Adrenal glands are unremarkable. Kidneys are normal, without renal calculi, focal lesion, or hydronephrosis. Bladder is unremarkable. Stomach/Bowel: Stomach appears normal. No small  bowel wall thickening, inflammation or distension. The appendix is visualized and is unremarkable. There is mild edema involving the colon from the cecum to the level of the splenic flexure Vascular/Lymphatic: Aortic atherosclerosis. The portal vein appears patent. Esophageal and gastric varices identified. Recanalization of the umbilical vein. No abdominopelvic adenopathy. Reproductive: Prostate is unremarkable. Other: There is a moderate volume of ascites within the abdomen and pelvis. No focal fluid collections identified. Right inguinal hernia contains fat as well as ascites. Musculoskeletal: Subcutaneous edema is identified along both flanks as well as the proximal portions of the lower extremities. No acute or suspicious osseous findings. IMPRESSION: 1. Morphologic features of the liver compatible with cirrhosis. 2. Stigmata of portal venous hypertension identified including moderate volume of ascites, esophageal and gastric varices and recanalization of the umbilical vein. 3. Heterogeneous appearance of the liver parenchyma with numerous tiny low-density foci scattered throughout which measure up to 7 mm.  These are new when compared with 02/21/2014. These are nonspecific and may reflect multiple dysplastic nodules. Consider follow-up imaging with nonemergent MRI of the liver without and with contrast material for further characterization 4. Gallstones. 5. Mild edema involving the colon from the cecum to the level of the splenic flexure. This is nonspecific and may be related to underlying liver disease. 6. Right inguinal hernia contains fat as well as ascites. 7. Subcutaneous edema is identified along both flanks as well as the proximal portions of the lower extremities. 8.  Aortic Atherosclerosis (ICD10-I70.0). Electronically Signed   By: Kerby Moors M.D.   On: 08/02/2022 13:38     ASSESSMENT & PLAN:  1.  Thrombocytopenia & macrocytic anemia - Seen at the request of NP Cecile Sheerer for evaluation and treatment of anemia and thrombocytopenia. - Labs sent by primary care (07/22/2022) show mild anemia with Hgb 12.3/MCV 97; platelets are 104.  Prior CBC (06/10/2022) showed Hgb 14.0/MCV 100 and platelets 107..  Labs from 03/03/2022 show Hgb 14.2/MCV 99 and platelets 117.  He has normal creatinine 0.99.  - HIV negative (08/03/2022).  Viral hepatitis panel obtained in September 2023 was negative.  ANA and RF were negative. - Diagnosed with liver cirrhosis in January 2024 CT abdomen/pelvis (08/02/2022) showed nodular liver consistent with cirrhosis as well as stigmata of portal venous hypertension including moderate volume ascites, esophageal and gastric varices, and recanalization of the umbilical vein; there were some scattered liver lesions noted, but MRI liver appeared benign. Splenomegaly on MRI with spleen measuring about 14.4 cm. Follows with [facility].  - Most recent CBC from date of hospital discharge (08/05/2022) showed Hgb 11.1/MCV 104.3, platelets 97; elevation in white blood cells after being placed on oral steroids - History of heavy alcohol consumption for over 30 years,  but has cut back significantly following hospitalization and diagnosis of liver cirrhosis in February 2024 - Reports easy bruising.  No rectal bleeding, melena, or epistaxis.  No B symptoms. - No history of blood transfusions. - Takes daily vitamin B1 and multivitamin.  Ibuprofen approximately 3 times weekly. - No history of autoimmune or connective tissue disease.  No tonic water, walnuts, quinine containing substances. - Colonoscopy on 04/05/2022 with nonbleeding internal hemorrhoids, polyps x 6, otherwise normal.  He reports EGD 30+ years ago. - DIFFERENTIAL DIAGNOSIS favors thrombocytopenia and macrocytic anemia secondary to liver cirrhosis/splenomegaly.  He is also at risk for nutritional deficiencies and bone marrow suppression due to his alcohol abuse. - PLAN: We will check nutritional panel, immature platelet fraction, and SPEP. - Will check  stool cards x 3 and encourage ongoing GI follow-up - Patient advised to [place], since it is likely causing increased side effects of daytime somnolence due to his liver cirrhosis. - RTC in 1 month to discuss results and next steps  2.  Other history - PMH: Liver cirrhosis, vertigo, sleep apnea, psoriasis, hypertension, GERD, diabetes, chronic back pain, anxiety/depression - TOBACCO: Currently smokes 0.5 PPD cigarettes.  Has been smoking 0.25-0.5 PPD since age 9, but had quit for about 10 years.  Total pack-year history approximately 14. - ALCOHOL: Heavy alcohol consumption for over 30 years.  Has cut back to 1 to 2 cans of beer each week following diagnosis of liver cirrhosis in February 2024. - DRUG/SUBSTANCE ABUSE: Reports previous addiction to hydrocodone pills, which he self reported to his PCP; hydrocodone was discontinued by PCP at the request of the patient. - SOCIAL he works for [employer], but has not been back to work following hospitalization for cirrhosis in January/February 2024.  He is accompanied in clinic by his girlfriend,  [name]. - FAMILY: No family history of blood problems.  Maternal grandfather had lung cancer.  Father deceased secondary to mesothelioma.   PLAN SUMMARY: >> Labs today = light chains, immunofixation, SPEP, B12, MMA, copper, folate, CBC/D, CMP, ferritin, iron/TIBC, LDH, reticulocytes, immature platelet fraction >> Stool cards x 3 >> OFFICE visit in 3 to 4 weeks    All questions were answered. The patient knows to call the clinic with any problems, questions or concerns.  Medical decision making: Moderate  Time spent on visit: I spent 40 minutes counseling the patient face to face. The total time spent in the appointment was 55 minutes and more than 50% was on counseling.  I, Tarri Abernethy PA-C, have seen this patient in conjunction with Dr. Derek Jack.  Greater than 50% of visit was performed by Dr. Delton Coombes.   Harriett Rush, PA-C 08/18/2022 10:14 AM  DR. Markus Casten: I have independently evaluated this patient and formulated my own assessment and plan.  I agree with HPI, and assessment and plan dictated by Casey Burkitt, PA-C.  Patient seen for mild macrocytic anemia and thrombocytopenia in the setting of cirrhosis.  Will check labs for anemia and thrombocytopenia.  Mild thrombocytopenia most likely from splenomegaly from cirrhosis.  We will check for nutritional deficiencies and stool cards for anemia.  He will be seen again in 4 weeks for follow-up.

## 2022-08-18 ENCOUNTER — Inpatient Hospital Stay: Payer: BC Managed Care – PPO

## 2022-08-18 ENCOUNTER — Inpatient Hospital Stay: Payer: BC Managed Care – PPO | Attending: Hematology | Admitting: Hematology

## 2022-08-18 ENCOUNTER — Inpatient Hospital Stay: Payer: BC Managed Care – PPO | Admitting: Internal Medicine

## 2022-08-18 ENCOUNTER — Encounter: Payer: Self-pay | Admitting: Hematology

## 2022-08-18 VITALS — BP 132/84 | HR 103 | Temp 98.6°F | Resp 16 | Ht 66.0 in | Wt 178.4 lb

## 2022-08-18 DIAGNOSIS — Z79899 Other long term (current) drug therapy: Secondary | ICD-10-CM

## 2022-08-18 DIAGNOSIS — D649 Anemia, unspecified: Secondary | ICD-10-CM | POA: Diagnosis not present

## 2022-08-18 DIAGNOSIS — R161 Splenomegaly, not elsewhere classified: Secondary | ICD-10-CM | POA: Diagnosis not present

## 2022-08-18 DIAGNOSIS — D539 Nutritional anemia, unspecified: Secondary | ICD-10-CM

## 2022-08-18 DIAGNOSIS — D696 Thrombocytopenia, unspecified: Secondary | ICD-10-CM | POA: Insufficient documentation

## 2022-08-18 DIAGNOSIS — F17210 Nicotine dependence, cigarettes, uncomplicated: Secondary | ICD-10-CM

## 2022-08-18 DIAGNOSIS — K7460 Unspecified cirrhosis of liver: Secondary | ICD-10-CM | POA: Diagnosis not present

## 2022-08-18 LAB — CBC WITH DIFFERENTIAL/PLATELET
Abs Immature Granulocytes: 0.15 10*3/uL — ABNORMAL HIGH (ref 0.00–0.07)
Basophils Absolute: 0 10*3/uL (ref 0.0–0.1)
Basophils Relative: 0 %
Eosinophils Absolute: 0.3 10*3/uL (ref 0.0–0.5)
Eosinophils Relative: 2 %
HCT: 36.2 % — ABNORMAL LOW (ref 39.0–52.0)
Hemoglobin: 12.9 g/dL — ABNORMAL LOW (ref 13.0–17.0)
Immature Granulocytes: 1 %
Lymphocytes Relative: 8 %
Lymphs Abs: 1.1 10*3/uL (ref 0.7–4.0)
MCH: 37.1 pg — ABNORMAL HIGH (ref 26.0–34.0)
MCHC: 35.6 g/dL (ref 30.0–36.0)
MCV: 104 fL — ABNORMAL HIGH (ref 80.0–100.0)
Monocytes Absolute: 1 10*3/uL (ref 0.1–1.0)
Monocytes Relative: 7 %
Neutro Abs: 11.4 10*3/uL — ABNORMAL HIGH (ref 1.7–7.7)
Neutrophils Relative %: 82 %
Platelets: 144 10*3/uL — ABNORMAL LOW (ref 150–400)
RBC: 3.48 MIL/uL — ABNORMAL LOW (ref 4.22–5.81)
RDW: 12.9 % (ref 11.5–15.5)
WBC: 13.9 10*3/uL — ABNORMAL HIGH (ref 4.0–10.5)
nRBC: 0 % (ref 0.0–0.2)

## 2022-08-18 LAB — COMPREHENSIVE METABOLIC PANEL
ALT: 84 U/L — ABNORMAL HIGH (ref 0–44)
AST: 91 U/L — ABNORMAL HIGH (ref 15–41)
Albumin: 3.1 g/dL — ABNORMAL LOW (ref 3.5–5.0)
Alkaline Phosphatase: 218 U/L — ABNORMAL HIGH (ref 38–126)
Anion gap: 15 (ref 5–15)
BUN: 18 mg/dL (ref 6–20)
CO2: 21 mmol/L — ABNORMAL LOW (ref 22–32)
Calcium: 9.5 mg/dL (ref 8.9–10.3)
Chloride: 91 mmol/L — ABNORMAL LOW (ref 98–111)
Creatinine, Ser: 0.82 mg/dL (ref 0.61–1.24)
GFR, Estimated: >60 mL/min (ref >60)
Glucose, Bld: 333 mg/dL — ABNORMAL HIGH (ref 70–99)
Potassium: 4.4 mmol/L (ref 3.5–5.1)
Sodium: 127 mmol/L — ABNORMAL LOW (ref 135–145)
Total Bilirubin: 2.3 mg/dL — ABNORMAL HIGH (ref 0.3–1.2)
Total Protein: 7.3 g/dL (ref 6.5–8.1)

## 2022-08-18 LAB — FOLATE: Folate: 9.3 ng/mL (ref >5.9)

## 2022-08-18 LAB — IRON AND TIBC
Iron: 92 ug/dL (ref 45–182)
Saturation Ratios: 33 % (ref 17.9–39.5)
TIBC: 275 ug/dL (ref 250–450)
UIBC: 183 ug/dL

## 2022-08-18 LAB — FERRITIN: Ferritin: 375 ng/mL — ABNORMAL HIGH (ref 24–336)

## 2022-08-18 LAB — VITAMIN B12: Vitamin B-12: 770 pg/mL (ref 180–914)

## 2022-08-18 LAB — RETICULOCYTES
Immature Retic Fract: 7 % (ref 2.3–15.9)
RBC.: 3.44 MIL/uL — ABNORMAL LOW (ref 4.22–5.81)
Retic Count, Absolute: 78.1 10*3/uL (ref 19.0–186.0)
Retic Ct Pct: 2.3 % (ref 0.4–3.1)

## 2022-08-18 LAB — LACTATE DEHYDROGENASE: LDH: 210 U/L — ABNORMAL HIGH (ref 98–192)

## 2022-08-18 LAB — IMMATURE PLATELET FRACTION: Immature Platelet Fraction: 8.7 % — ABNORMAL HIGH (ref 1.2–8.6)

## 2022-08-18 NOTE — Patient Instructions (Signed)
[facility] [place] **   You were seen today by Dr. Delton Coombes & Tarri Abernethy PA-C for your anemia (low red blood cells) and thrombocytopenia (low platelets).  We suspect that your abnormal blood counts are related to your liver cirrhosis.  We will check labs today and see you for a follow-up visit in 3 to 4 weeks to discuss results.  LABS: Check labs today before leaving the hospital building.   OTHER TESTS: Complete stool cards x 3  FOLLOW-UP APPOINTMENT: Office visit in 1 month  ** Thank you for trusting me with your healthcare!  I strive to provide all of my patients with quality care at each visit.  If you receive a survey for this visit, I would be so grateful to you for taking the time to provide feedback.  Thank you in advance!  ~ Lennyn Gange                   Dr. Derek Jack   &   Tarri Abernethy, PA-C   - - - - - - - - - - - - - - - - - -    Thank you for choosing [place] [facility] to provide your oncology and hematology care.  To afford each patient quality time with our provider, please arrive at least 15 minutes before your scheduled appointment time.   If you have a lab appointment with the [facility] please come in thru the Main Entrance and check in at the main information desk.  You need to re-schedule your appointment should you arrive 10 or more minutes late.  We strive to give you quality time with our providers, and arriving late affects you and other patients whose appointments are after yours.  Also, if you no show three or more times for appointments you may be dismissed from the clinic at the providers discretion.     Again, thank you for choosing [facility].  Our hope is that these requests will decrease the amount of time that you wait before being seen by our physicians.        _____________________________________________________________  Should you have questions after your visit to [facility], please contact our office at [phone] and follow the prompts.  Our office hours are 8:00 a.m. and 4:30 p.m. Monday - Friday.  Please note that voicemails left after 4:00 p.m. may not be returned until the following business day.  We are closed weekends and major holidays.  You do have access to a nurse 24-7, just call the main number to the clinic [phone] and do not press any options, hold on the line and a nurse will answer the phone.    For prescription refill requests, have your pharmacy contact our office and allow 72 hours.

## 2022-08-19 ENCOUNTER — Encounter: Payer: Self-pay | Admitting: Internal Medicine

## 2022-08-19 LAB — KAPPA/LAMBDA LIGHT CHAINS
Kappa free light chain: 38.9 mg/L — ABNORMAL HIGH (ref 3.3–19.4)
Kappa, lambda light chain ratio: 1.31 (ref 0.26–1.65)
Lambda free light chains: 29.7 mg/L — ABNORMAL HIGH (ref 5.7–26.3)

## 2022-08-20 DIAGNOSIS — G4700 Insomnia, unspecified: Secondary | ICD-10-CM | POA: Diagnosis not present

## 2022-08-20 DIAGNOSIS — F1010 Alcohol abuse, uncomplicated: Secondary | ICD-10-CM | POA: Diagnosis not present

## 2022-08-20 DIAGNOSIS — E1165 Type 2 diabetes mellitus with hyperglycemia: Secondary | ICD-10-CM | POA: Diagnosis not present

## 2022-08-20 DIAGNOSIS — R42 Dizziness and giddiness: Secondary | ICD-10-CM | POA: Diagnosis not present

## 2022-08-20 DIAGNOSIS — R112 Nausea with vomiting, unspecified: Secondary | ICD-10-CM | POA: Diagnosis not present

## 2022-08-20 LAB — PROTEIN ELECTROPHORESIS, SERUM
A/G Ratio: 0.8 (ref 0.7–1.7)
Albumin ELP: 3.3 g/dL (ref 2.9–4.4)
Alpha-1-Globulin: 0.3 g/dL (ref 0.0–0.4)
Alpha-2-Globulin: 0.8 g/dL (ref 0.4–1.0)
Beta Globulin: 1.1 g/dL (ref 0.7–1.3)
Gamma Globulin: 1.7 g/dL (ref 0.4–1.8)
Globulin, Total: 3.9 g/dL (ref 2.2–3.9)
Total Protein ELP: 7.2 g/dL (ref 6.0–8.5)

## 2022-08-23 LAB — IMMUNOFIXATION ELECTROPHORESIS
IgA: 926 mg/dL — ABNORMAL HIGH (ref 90–386)
IgG (Immunoglobin G), Serum: 1308 mg/dL (ref 603–1613)
IgM (Immunoglobulin M), Srm: 358 mg/dL — ABNORMAL HIGH (ref 20–172)
Total Protein ELP: 7.1 g/dL (ref 6.0–8.5)

## 2022-08-24 ENCOUNTER — Inpatient Hospital Stay: Payer: BC Managed Care – PPO | Admitting: Gastroenterology

## 2022-08-24 LAB — METHYLMALONIC ACID, SERUM: Methylmalonic Acid, Quantitative: 83 nmol/L (ref 0–378)

## 2022-08-25 ENCOUNTER — Inpatient Hospital Stay (HOSPITAL_COMMUNITY)
Admission: EM | Admit: 2022-08-25 | Discharge: 2022-08-29 | DRG: 377 | Disposition: A | Discharge status: Home / Self Care | Payer: BC Managed Care – PPO | Attending: Internal Medicine | Admitting: Internal Medicine

## 2022-08-25 ENCOUNTER — Encounter (HOSPITAL_COMMUNITY): Payer: Self-pay | Admitting: *Deleted

## 2022-08-25 ENCOUNTER — Other Ambulatory Visit: Payer: Self-pay

## 2022-08-25 DIAGNOSIS — F17210 Nicotine dependence, cigarettes, uncomplicated: Secondary | ICD-10-CM | POA: Diagnosis present

## 2022-08-25 DIAGNOSIS — K5521 Angiodysplasia of colon with hemorrhage: Principal | ICD-10-CM | POA: Diagnosis present

## 2022-08-25 DIAGNOSIS — D696 Thrombocytopenia, unspecified: Secondary | ICD-10-CM | POA: Diagnosis not present

## 2022-08-25 DIAGNOSIS — R9431 Abnormal electrocardiogram [ECG] [EKG]: Secondary | ICD-10-CM | POA: Diagnosis present

## 2022-08-25 DIAGNOSIS — Z833 Family history of diabetes mellitus: Secondary | ICD-10-CM

## 2022-08-25 DIAGNOSIS — F419 Anxiety disorder, unspecified: Secondary | ICD-10-CM | POA: Diagnosis not present

## 2022-08-25 DIAGNOSIS — I864 Gastric varices: Secondary | ICD-10-CM | POA: Diagnosis present

## 2022-08-25 DIAGNOSIS — H409 Unspecified glaucoma: Secondary | ICD-10-CM | POA: Diagnosis present

## 2022-08-25 DIAGNOSIS — I10 Essential (primary) hypertension: Secondary | ICD-10-CM | POA: Diagnosis present

## 2022-08-25 DIAGNOSIS — I8510 Secondary esophageal varices without bleeding: Secondary | ICD-10-CM | POA: Diagnosis not present

## 2022-08-25 DIAGNOSIS — I8511 Secondary esophageal varices with bleeding: Secondary | ICD-10-CM | POA: Diagnosis not present

## 2022-08-25 DIAGNOSIS — F1010 Alcohol abuse, uncomplicated: Secondary | ICD-10-CM | POA: Diagnosis not present

## 2022-08-25 DIAGNOSIS — K219 Gastro-esophageal reflux disease without esophagitis: Secondary | ICD-10-CM | POA: Diagnosis present

## 2022-08-25 DIAGNOSIS — K7682 Hepatic encephalopathy: Secondary | ICD-10-CM

## 2022-08-25 DIAGNOSIS — Z794 Long term (current) use of insulin: Secondary | ICD-10-CM | POA: Diagnosis not present

## 2022-08-25 DIAGNOSIS — K922 Gastrointestinal hemorrhage, unspecified: Principal | ICD-10-CM | POA: Diagnosis present

## 2022-08-25 DIAGNOSIS — K766 Portal hypertension: Secondary | ICD-10-CM | POA: Diagnosis present

## 2022-08-25 DIAGNOSIS — G4700 Insomnia, unspecified: Secondary | ICD-10-CM | POA: Diagnosis not present

## 2022-08-25 DIAGNOSIS — Z79899 Other long term (current) drug therapy: Secondary | ICD-10-CM

## 2022-08-25 DIAGNOSIS — E785 Hyperlipidemia, unspecified: Secondary | ICD-10-CM | POA: Diagnosis not present

## 2022-08-25 DIAGNOSIS — E119 Type 2 diabetes mellitus without complications: Secondary | ICD-10-CM | POA: Diagnosis not present

## 2022-08-25 DIAGNOSIS — E871 Hypo-osmolality and hyponatremia: Secondary | ICD-10-CM | POA: Diagnosis present

## 2022-08-25 DIAGNOSIS — R7989 Other specified abnormal findings of blood chemistry: Secondary | ICD-10-CM | POA: Diagnosis present

## 2022-08-25 DIAGNOSIS — L409 Psoriasis, unspecified: Secondary | ICD-10-CM | POA: Diagnosis present

## 2022-08-25 DIAGNOSIS — Z8249 Family history of ischemic heart disease and other diseases of the circulatory system: Secondary | ICD-10-CM

## 2022-08-25 DIAGNOSIS — D62 Acute posthemorrhagic anemia: Secondary | ICD-10-CM | POA: Diagnosis present

## 2022-08-25 DIAGNOSIS — D649 Anemia, unspecified: Secondary | ICD-10-CM

## 2022-08-25 DIAGNOSIS — I8500 Esophageal varices without bleeding: Secondary | ICD-10-CM | POA: Diagnosis not present

## 2022-08-25 DIAGNOSIS — D539 Nutritional anemia, unspecified: Secondary | ICD-10-CM

## 2022-08-25 DIAGNOSIS — R601 Generalized edema: Secondary | ICD-10-CM | POA: Diagnosis not present

## 2022-08-25 DIAGNOSIS — E1365 Other specified diabetes mellitus with hyperglycemia: Secondary | ICD-10-CM | POA: Diagnosis not present

## 2022-08-25 DIAGNOSIS — K7031 Alcoholic cirrhosis of liver with ascites: Secondary | ICD-10-CM | POA: Diagnosis not present

## 2022-08-25 DIAGNOSIS — F1020 Alcohol dependence, uncomplicated: Secondary | ICD-10-CM | POA: Diagnosis present

## 2022-08-25 DIAGNOSIS — F32A Depression, unspecified: Secondary | ICD-10-CM | POA: Diagnosis not present

## 2022-08-25 DIAGNOSIS — K31819 Angiodysplasia of stomach and duodenum without bleeding: Secondary | ICD-10-CM | POA: Diagnosis not present

## 2022-08-25 LAB — COMPREHENSIVE METABOLIC PANEL
ALT: 74 U/L — ABNORMAL HIGH (ref 0–44)
AST: 126 U/L — ABNORMAL HIGH (ref 15–41)
Albumin: 2.7 g/dL — ABNORMAL LOW (ref 3.5–5.0)
Alkaline Phosphatase: 149 U/L — ABNORMAL HIGH (ref 38–126)
Anion gap: 11 (ref 5–15)
BUN: 25 mg/dL — ABNORMAL HIGH (ref 6–20)
CO2: 27 mmol/L (ref 22–32)
Calcium: 7.9 mg/dL — ABNORMAL LOW (ref 8.9–10.3)
Chloride: 81 mmol/L — ABNORMAL LOW (ref 98–111)
Creatinine, Ser: 1.17 mg/dL (ref 0.61–1.24)
GFR, Estimated: >60 mL/min (ref >60)
Glucose, Bld: 372 mg/dL — ABNORMAL HIGH (ref 70–99)
Potassium: 5 mmol/L (ref 3.5–5.1)
Sodium: 119 mmol/L — CL (ref 135–145)
Total Bilirubin: 3.2 mg/dL — ABNORMAL HIGH (ref 0.3–1.2)
Total Protein: 5.8 g/dL — ABNORMAL LOW (ref 6.5–8.1)

## 2022-08-25 LAB — CBC
HCT: 22.1 % — ABNORMAL LOW (ref 39.0–52.0)
Hemoglobin: 8.2 g/dL — ABNORMAL LOW (ref 13.0–17.0)
MCH: 38.9 pg — ABNORMAL HIGH (ref 26.0–34.0)
MCHC: 37.1 g/dL — ABNORMAL HIGH (ref 30.0–36.0)
MCV: 104.7 fL — ABNORMAL HIGH (ref 80.0–100.0)
Platelets: 97 10*3/uL — ABNORMAL LOW (ref 150–400)
RBC: 2.11 MIL/uL — ABNORMAL LOW (ref 4.22–5.81)
RDW: 13.7 % (ref 11.5–15.5)
WBC: 13.1 10*3/uL — ABNORMAL HIGH (ref 4.0–10.5)
nRBC: 0 % (ref 0.0–0.2)

## 2022-08-25 LAB — POC OCCULT BLOOD, ED: Fecal Occult Bld: POSITIVE — AB

## 2022-08-25 LAB — PREPARE RBC (CROSSMATCH)

## 2022-08-25 LAB — ABO/RH: ABO/RH(D): A POS

## 2022-08-25 LAB — LIPASE, BLOOD: Lipase: 99 U/L — ABNORMAL HIGH (ref 11–51)

## 2022-08-25 LAB — CBG MONITORING, ED
Glucose-Capillary: 237 mg/dL — ABNORMAL HIGH (ref 70–99)
Glucose-Capillary: 299 mg/dL — ABNORMAL HIGH (ref 70–99)

## 2022-08-25 LAB — COPPER, SERUM: Copper: 121 ug/dL (ref 69–132)

## 2022-08-25 LAB — PROTIME-INR
INR: 1.6 — ABNORMAL HIGH (ref 0.8–1.2)
Prothrombin Time: 18.5 seconds — ABNORMAL HIGH (ref 11.4–15.2)

## 2022-08-25 LAB — MAGNESIUM: Magnesium: 1.8 mg/dL (ref 1.7–2.4)

## 2022-08-25 MED ORDER — PANTOPRAZOLE SODIUM 40 MG IV SOLR
40.0000 mg | Freq: Two times a day (BID) | INTRAVENOUS | Status: DC
  Administered 2022-08-29: 40 mg via INTRAVENOUS
  Filled 2022-08-25: qty 10

## 2022-08-25 MED ORDER — OCTREOTIDE LOAD VIA INFUSION
50.0000 ug | Freq: Once | INTRAVENOUS | Status: AC
  Administered 2022-08-25: 50 ug via INTRAVENOUS
  Filled 2022-08-25: qty 25

## 2022-08-25 MED ORDER — SODIUM CHLORIDE 0.9 % IV SOLN
2.0000 g | Freq: Once | INTRAVENOUS | Status: AC
  Administered 2022-08-25: 2 g via INTRAVENOUS
  Filled 2022-08-25: qty 20

## 2022-08-25 MED ORDER — METOCLOPRAMIDE HCL 5 MG/ML IJ SOLN
10.0000 mg | Freq: Once | INTRAMUSCULAR | Status: AC
  Administered 2022-08-25: 10 mg via INTRAVENOUS
  Filled 2022-08-25: qty 2

## 2022-08-25 MED ORDER — THIAMINE HCL 100 MG/ML IJ SOLN: 100.0000 mg | Freq: Every day | INTRAMUSCULAR | Status: DC

## 2022-08-25 MED ORDER — INSULIN ASPART 100 UNIT/ML IJ SOLN
0.0000 [IU] | INTRAMUSCULAR | Status: DC
  Administered 2022-08-25: 5 [IU] via SUBCUTANEOUS
  Administered 2022-08-25: 8 [IU] via SUBCUTANEOUS
  Administered 2022-08-26: 3 [IU] via SUBCUTANEOUS
  Administered 2022-08-26 (×2): 5 [IU] via SUBCUTANEOUS
  Administered 2022-08-27: 8 [IU] via SUBCUTANEOUS
  Administered 2022-08-27: 2 [IU] via SUBCUTANEOUS
  Administered 2022-08-27: 3 [IU] via SUBCUTANEOUS
  Filled 2022-08-25 (×2): qty 1

## 2022-08-25 MED ORDER — FOLIC ACID 1 MG PO TABS
1.0000 mg | ORAL_TABLET | Freq: Every day | ORAL | Status: DC
  Administered 2022-08-27 – 2022-08-29 (×3): 1 mg via ORAL
  Filled 2022-08-25 (×4): qty 1

## 2022-08-25 MED ORDER — SODIUM CHLORIDE 0.9% IV SOLUTION: Freq: Once | INTRAVENOUS | Status: AC

## 2022-08-25 MED ORDER — PANTOPRAZOLE 80MG IVPB - SIMPLE MED
80.0000 mg | Freq: Once | INTRAVENOUS | Status: AC
  Administered 2022-08-25: 80 mg via INTRAVENOUS

## 2022-08-25 MED ORDER — PANTOPRAZOLE INFUSION (NEW) - SIMPLE MED
8.0000 mg/h | INTRAVENOUS | Status: AC
  Administered 2022-08-25 – 2022-08-27 (×6): 8 mg/h via INTRAVENOUS
  Filled 2022-08-25: qty 100
  Filled 2022-08-25: qty 80
  Filled 2022-08-25: qty 100
  Filled 2022-08-25: qty 80
  Filled 2022-08-25: qty 100
  Filled 2022-08-25 (×2): qty 80

## 2022-08-25 MED ORDER — SODIUM CHLORIDE 0.9 % IV BOLUS
1000.0000 mL | Freq: Once | INTRAVENOUS | Status: AC
  Administered 2022-08-25: 1000 mL via INTRAVENOUS

## 2022-08-25 MED ORDER — LORAZEPAM 1 MG PO TABS
1.0000 mg | ORAL_TABLET | ORAL | Status: AC | PRN
  Administered 2022-08-27 – 2022-08-28 (×2): 1 mg via ORAL
  Filled 2022-08-25 (×2): qty 1

## 2022-08-25 MED ORDER — THIAMINE MONONITRATE 100 MG PO TABS
100.0000 mg | ORAL_TABLET | Freq: Every day | ORAL | Status: DC
  Administered 2022-08-27 – 2022-08-29 (×3): 100 mg via ORAL
  Filled 2022-08-25 (×4): qty 1

## 2022-08-25 MED ORDER — LORAZEPAM 2 MG/ML IJ SOLN
1.0000 mg | INTRAMUSCULAR | Status: AC | PRN
  Administered 2022-08-25: 2 mg via INTRAVENOUS
  Filled 2022-08-25: qty 1

## 2022-08-25 MED ORDER — SODIUM CHLORIDE 0.9 % IV SOLN: INTRAVENOUS | Status: DC

## 2022-08-25 MED ORDER — POLYETHYLENE GLYCOL 3350 17 G PO PACK: 17.0000 g | PACK | Freq: Every day | ORAL | Status: DC | PRN

## 2022-08-25 MED ORDER — ADULT MULTIVITAMIN W/MINERALS CH
1.0000 | ORAL_TABLET | Freq: Every day | ORAL | Status: DC
  Administered 2022-08-27 – 2022-08-29 (×3): 1 via ORAL
  Filled 2022-08-25 (×4): qty 1

## 2022-08-25 MED ORDER — SODIUM CHLORIDE 0.9 % IV SOLN
50.0000 ug/h | INTRAVENOUS | Status: DC
  Administered 2022-08-25 – 2022-08-29 (×9): 50 ug/h via INTRAVENOUS
  Filled 2022-08-25 (×12): qty 1

## 2022-08-25 NOTE — Assessment & Plan Note (Addendum)
Sodium 119.  Baseline low 130s.  Last checked 7 days ago 2/14 sodium was 127. -1 L normal saline bolus given, continue N/s 75cc/hr x 20hrs -Hold Lasix for now

## 2022-08-25 NOTE — Assessment & Plan Note (Signed)
Hold Prozac for now while n.p.o. and with prolonged QT

## 2022-08-25 NOTE — Assessment & Plan Note (Addendum)
QTc 521.  Not new, present on recent EKG. Potassium 5.  Magnesium 1.8. -Hold fluoxetine

## 2022-08-25 NOTE — H&P (Signed)
History and Physical    Shawn Velazquez [id number] DOB: Jan 20, 1966 DOA: 08/25/2022  PCP: Celene Squibb, MD   Patient coming from: Home  I have personally briefly reviewed patient's old medical records in [place]  Chief Complaint: Vomiting Blood  HPI: Shawn Velazquez is a 57 y.o. male with medical history significant for  alcoholic liver cirrhosis, diabetes mellitus, hypertension, anxiety. Patient presented to the ED with complaints of vomiting blood, he reports for the past 3 days, he has been vomiting a large amount of blood daily.  He has also had black stools and blood in his stools-this is not new and has been intermittent since he was discharged from the hospital 2/1.Marland Kitchen  He reports intermittent dizziness over the past 3 days also.  No chest pain.  No difficulty breathing.  He reports left lower abdominal pain.  He still drinks alcohol daily.  Recent hospitalization 1/29 to 2/1, for decompensated liver disease, anasarca, diuresed with IV Lasix, underwent paracentesis, 3.5 L removed.  ED Course: Blood pressure 123456 systolic.  Heart rate 94-98.  Hemoglobin 8.2 down from baseline 11-13.  Chronically elevated liver enzymes. 1 Unit PRBC ordered for transfusion. EDP talked to Dr. Gala Romney-, NPO, octreotide, Rocephin, Protonix drip.   Review of Systems: As per HPI all other systems reviewed and negative.  Past Medical History:  Diagnosis Date   Anxiety    Arthritis    affects hands, shoulder, neck, knees, hips, ankles, toes   Chronic back pain    Diabetes mellitus    diet controlled   Eczema    Fatty liver, alcoholic    GERD (gastroesophageal reflux disease)    Glaucoma    slight case   Hypertension    Psoriasis    Sleep apnea    Substance abuse ([place])    Vertigo 09/11/2017   Wears glasses     Past Surgical History:  Procedure Laterality Date   COLONOSCOPY WITH PROPOFOL N/A 04/05/2022   Procedure: COLONOSCOPY WITH PROPOFOL;  Surgeon: [name], DO;   [facility];  Service: Endoscopy;  Laterality: N/A;  10:45 AM,unable to reach pt to move up   [place]     POLYPECTOMY  04/05/2022   Procedure: POLYPECTOMY;  Surgeon: [name], DO;  Location: [facility];  Service: Endoscopy;;   POSTERIOR CERVICAL LAMINECTOMY  04/21/2012   Procedure: POSTERIOR CERVICAL LAMINECTOMY;  Surgeon: Elaina Hoops, MD;  Location: [place] [facility]UR[facility] ORS;  Service: Neurosurgery;  Laterality: Left;  Posterior Cervical laminectomy/foraminotomy and diskectomy, left cervical seven-thoracic one   ROTATOR CUFF REPAIR     THORACIC DISCECTOMY Left 04/03/2014   Procedure: Left Thoracic Seven to Eight, Thoracic Eight to Nine Thoracic Discectomy ;  Surgeon: Elaina Hoops, MD;  Location: [place] NEURO ORS;  Service: Neurosurgery;  Laterality: Left;     reports that he has been smoking cigarettes. He has a 10.00 pack-year smoking history. He has never used smokeless tobacco. He reports current alcohol use of about 12.0 standard drinks of alcohol per week. He reports that he does not use drugs.  Allergies  Allergen Reactions   Neosporin + Pain Relief Max St [Neomy-Bacit-Polymyx-Pramoxine] Rash    Family History  Problem Relation Age of Onset   Diabetes Father    Cancer - Lung Father    Hypertension Other    Cancer - Lung Other     Prior to Admission medications   Medication Sig Start Date End Date Taking? Authorizing Provider  acetaminophen (TYLENOL) 325 MG tablet Take 650 mg by mouth as needed for mild pain, headache or moderate pain.   Yes [provider]  FLUoxetine (PROZAC) 10 MG capsule TAKE 4 CAPSULES BY MOUTH EVERY DAY Patient taking differently: Take 10 mg by mouth See admin instructions. TAKE 2 CAPSULES IN THE MORNING AND 2 CAPSULES EVERY EVENING. 02/17/22  Yes Paseda, Dewaine Conger, FNP  furosemide (LASIX) 40 MG tablet Take 1 tablet (40 mg total) by mouth daily. 08/06/22 08/06/23 Yes Johnson, Clanford L, MD  insulin glargine (LANTUS SOLOSTAR)  100 UNIT/ML Solostar Pen Inject 40 Units into the skin at bedtime. 08/05/22  Yes Johnson, Clanford L, MD  insulin lispro (HUMALOG KWIKPEN) 100 UNIT/ML KwikPen Inject 8-14 Units into the skin 3 (three) times daily. Patient taking differently: Inject 8-14 Units into the skin 3 (three) times daily. PER SLIDING SCALE 05/12/22  Yes Brita Romp, NP  meclizine (ANTIVERT) 12.5 MG tablet Take 12.5 mg by mouth 3 (three) times daily as needed for dizziness or nausea. 08/20/22  Yes [provider]  metFORMIN (GLUCOPHAGE-XR) 500 MG 24 hr tablet Take 2 tablets (1,000 mg total) by mouth 2 (two) times daily with a meal. 08/05/22  Yes Johnson, Clanford L, MD  Multiple Vitamin (MULTIVITAMIN) tablet Take 1 tablet by mouth daily.   Yes [provider]  omeprazole (PRILOSEC OTC) 20 MG tablet Take 20 mg by mouth every other day.   Yes [provider]  ondansetron (ZOFRAN-ODT) 4 MG disintegrating tablet Take 4 mg by mouth 2 (two) times daily. FOR 14 DAYS START ON 08/20/22 08/20/22  Yes [provider]  OVER THE COUNTER MEDICATION Take 1 tablet by mouth daily. Melatonin   Yes [provider]  prednisoLONE (PRELONE) 15 MG/5ML SOLN Take 13.3 mLs (40 mg total) by mouth daily before breakfast for 25 days, THEN 8.3 mLs (25 mg total) daily before breakfast for 7 days, THEN 5 mLs (15 mg total) daily before breakfast for 7 days, THEN 3 mLs (9 mg total) daily before breakfast for 5 days. 08/06/22 09/19/22 Yes Johnson, Clanford L, MD  spironolactone (ALDACTONE) 100 MG tablet Take 1 tablet (100 mg total) by mouth daily. 08/06/22  Yes Johnson, Clanford L, MD  thiamine (VITAMIN B-1) 100 MG tablet Take 1 tablet (100 mg total) by mouth daily. 08/06/22  Yes Johnson, Clanford L, MD  albuterol (VENTOLIN HFA) 108 (90 Base) MCG/ACT inhaler INHALE 2 PUFFS INTO THE LUNGS EVERY 6 HOURS AS NEEDED FOR WHEEZING OR SHORTNESS OF BREATH Patient not taking: Reported on 08/25/2022 05/18/21   Lindell Spar, MD   BELSOMRA 15 MG TABS Take 1 tablet by mouth daily. Patient not taking: Reported on 08/25/2022 07/22/22   [provider]  Continuous Blood Gluc Sensor (DEXCOM G7 SENSOR) MISC Inject 1 application. into the skin as directed. Change sensor every 10 days as directed. 12/09/21   Brita Romp, NP  glucose blood test strip Use as instructed to monitor glucose 4 times daily 12/09/21   Brita Romp, NP  Insulin Pen Needle (PEN NEEDLES) 32G X 4 MM MISC 1 each by Does not apply route in the morning, at noon, in the evening, and at bedtime. Use to inject insulin 4 times daily 05/12/22   Brita Romp, NP  montelukast (SINGULAIR) 10 MG tablet TAKE 1 TABLET(10 MG) BY MOUTH AT BEDTIME Patient not taking: Reported on 08/25/2022 12/10/21   Renee Rival, FNP    Physical Exam: Vitals:   08/25/22 1534 08/25/22  1536  BP:  104/65  Pulse:  98  Resp:  19  Temp:  97.6 F (36.4 C)  TempSrc:  Oral  SpO2:  95%  Weight: 80.3 kg   Height: 5' 6"$  (1.676 m)     Constitutional: NAD, calm, comfortable Vitals:   08/25/22 1534 08/25/22 1536  BP:  104/65  Pulse:  98  Resp:  19  Temp:  97.6 F (36.4 C)  TempSrc:  Oral  SpO2:  95%  Weight: 80.3 kg   Height: 5' 6"$  (1.676 m)    Eyes: PERRL, lids and conjunctivae normal ENMT: Mucous membranes are moist.  Neck: normal, supple, no masses, no thyromegaly Respiratory: clear to auscultation bilaterally, no wheezing, no crackles. Normal respiratory effort. No accessory muscle use.  Cardiovascular: Regular rate and rhythm, no murmurs / rubs / gallops.  1+ pitting bilateral lower extremity edema to mid leg. 2+ pedal pulses. No carotid bruits.  Abdomen: no tenderness, distended, no masses palpated. No hepatosplenomegaly.   Musculoskeletal: no clubbing / cyanosis. No joint deformity upper and lower extremities.  Skin: no rashes, lesions, ulcers. No induration Neurologic: No apparent cranial nerve abnormalities, moving extremities  spontaneously. Psychiatric: Normal judgment and insight. Alert and oriented x 3. Normal mood.   Labs on Admission: I have personally reviewed following labs and imaging studies  CBC: Recent Labs  Lab 08/25/22 1558  WBC 13.1*  HGB 8.2*  HCT 22.1*  MCV 104.7*  PLT 97*   Basic Metabolic Panel: Recent Labs  Lab 08/25/22 1558  NA 119*  K 5.0  CL 81*  CO2 27  GLUCOSE 372*  BUN 25*  CREATININE 1.17  CALCIUM 7.9*  MG 1.8   GFR: Estimated Creatinine Clearance: 68.5 mL/min (by C-G formula based on SCr of 1.17 mg/dL). Liver Function Tests: Recent Labs  Lab 08/25/22 1558  AST 126*  ALT 74*  ALKPHOS 149*  BILITOT 3.2*  PROT 5.8*  ALBUMIN 2.7*   Recent Labs  Lab 08/25/22 1558  LIPASE 99*   No results for input(s): "AMMONIA" in the last 168 hours. Coagulation Profile: Recent Labs  Lab 08/25/22 1558  INR 1.6*   Radiological Exams on Admission: No results found.  EKG: Independently reviewed.  Sinus rhythm, rate 95, QTc prolonged 521.  Assessment/Plan Principal Problem:   Upper GI bleed Active Problems:   Hyponatremia   Alcoholic cirrhosis of liver with ascites (HCC)   Prolonged QT interval   Hypertension, essential   Anxiety and depression   Elevated LFTs   Alcohol abuse   DM (diabetes mellitus) ([place])  Assessment and Plan: * Upper GI bleed Hematemesis, hematochezia, melena.  Hemoglobin 8.2, down from baseline 11-13.  With dizziness.  History of liver cirrhosis, last 61/29/24 and MRI liver showing esophageal and gastric varices. - EDP consulted GI Dr. Gala Romney- N. p.o., octreotide, IV Protonix drip, Rocephin -1 unit PRBC ordered for transfusion - 1 l bolus given continue N/s 75cc/hr X 20hrs -CBC Q6 hourly x 4   Hyponatremia Sodium 119.  Baseline low 130s.  Last checked 7 days ago 2/14 sodium was 127. -1 L normal saline bolus given, continue N/s 75cc/hr x 20hrs -Hold Lasix for now  Prolonged QT interval QTc 521.  Not new, present on recent EKG.  Potassium 5.  Magnesium 1.8. -Hold fluoxetine  Alcoholic cirrhosis of liver with ascites (HCC) Elevated liver enzymes, thrombocytopenia platelet 97 (slight fluctuating 97-146), INR 1.6.  Ongoing alcohol abuse.  Last drink was last night.  Drinks wine daily.  No sign of withdrawal  at this time - CIWA PRN - Thiamine, folate, multivitamins -Check Phos -Hemoglobin, platelets  DM (diabetes mellitus) (HCC) - HgbA1c- 5.2 - SSI- M -Hold home Lantus 40 units daily, Hold metformin - NPO  Elevated LFTs Stable and chronically elevated AST 126, ALT 74, ALP 149.  Total bili 3.2.  Anxiety and depression Hold Prozac for now while n.p.o. and with prolonged QT  Hypertension, essential Blood pressure soft, -Hold Lasix, spironolactone for now in the setting of GI bleed   DVT prophylaxis: SCDS Code Status:  FULL code Family Communication: [name] at bedside. Disposition Plan:  > 2 days Consults called: GI Admission status: Inpt stepdown I certify that at the point of admission it is my clinical judgment that the patient will require inpatient hospital care spanning beyond 2 midnights from the point of admission due to high intensity of service, high risk for further deterioration and high frequency of surveillance required.   Author: Bethena Roys, MD 08/25/2022 8:58 PM  For on call review [email or web address].

## 2022-08-25 NOTE — Assessment & Plan Note (Addendum)
Hematemesis, hematochezia, melena.  Hemoglobin 8.2, down from baseline 11-13.  With dizziness.  History of liver cirrhosis, last 61/29/24 and MRI liver showing esophageal and gastric varices. - EDP consulted GI Dr. Gala Romney- N. p.o., octreotide, IV Protonix drip, Rocephin -1 unit PRBC ordered for transfusion - 1 l bolus given continue N/s 75cc/hr X 20hrs -CBC Q6 hourly x 4

## 2022-08-25 NOTE — ED Provider Notes (Signed)
[facility] Provider Note   CSN: [id number] Arrival date & time: 08/25/22  1500     History  Chief Complaint  Patient presents with   Hematemesis    Shawn Velazquez is a 57 y.o. male.  HPI Patient presents for hematemesis.  Medical history includes T2DM, anxiety, HTN, alcohol dependence, HLD, cirrhosis, sleep apnea, GERD.  Over the past 3 days, he has had daily bright red blood in emesis.  He estimates 1-2 episodes per day.  He has also had black stools.  He is a poor p.o. intake due to loss of appetite.  Today, p.o. intake was limited to broth and apple juice.  He does continue to drink alcohol, described as 1 drink per night to help him go to sleep.  His last drink was last night.  He has been feeling fatigue and generalized weakness.  He had a GI appointment scheduled for yesterday but it was canceled due to PA unavailability.  He did not inform them of his recent hematemesis.  Currently, he endorses some mild abdominal pain that is located left of umbilicus.      Home Medications Prior to Admission medications   Medication Sig Start Date End Date Taking? Authorizing Provider  acetaminophen (TYLENOL) 325 MG tablet Take 650 mg by mouth as needed for mild pain, headache or moderate pain.   Yes [provider]  FLUoxetine (PROZAC) 10 MG capsule TAKE 4 CAPSULES BY MOUTH EVERY DAY Patient taking differently: Take 10 mg by mouth See admin instructions. TAKE 2 CAPSULES IN THE MORNING AND 2 CAPSULES EVERY EVENING. 02/17/22  Yes Paseda, Dewaine Conger, FNP  furosemide (LASIX) 40 MG tablet Take 1 tablet (40 mg total) by mouth daily. 08/06/22 08/06/23 Yes Johnson, Clanford L, MD  insulin glargine (LANTUS SOLOSTAR) 100 UNIT/ML Solostar Pen Inject 40 Units into the skin at bedtime. 08/05/22  Yes Johnson, Clanford L, MD  insulin lispro (HUMALOG KWIKPEN) 100 UNIT/ML KwikPen Inject 8-14 Units into the skin 3 (three) times daily. Patient taking  differently: Inject 8-14 Units into the skin 3 (three) times daily. PER SLIDING SCALE 05/12/22  Yes Brita Romp, NP  meclizine (ANTIVERT) 12.5 MG tablet Take 12.5 mg by mouth 3 (three) times daily as needed for dizziness or nausea. 08/20/22  Yes [provider]  metFORMIN (GLUCOPHAGE-XR) 500 MG 24 hr tablet Take 2 tablets (1,000 mg total) by mouth 2 (two) times daily with a meal. 08/05/22  Yes Johnson, Clanford L, MD  Multiple Vitamin (MULTIVITAMIN) tablet Take 1 tablet by mouth daily.   Yes [provider]  omeprazole (PRILOSEC OTC) 20 MG tablet Take 20 mg by mouth every other day.   Yes [provider]  ondansetron (ZOFRAN-ODT) 4 MG disintegrating tablet Take 4 mg by mouth 2 (two) times daily. FOR 14 DAYS START ON 08/20/22 08/20/22  Yes [provider]  OVER THE COUNTER MEDICATION Take 1 tablet by mouth daily. Melatonin   Yes [provider]  prednisoLONE (PRELONE) 15 MG/5ML SOLN Take 13.3 mLs (40 mg total) by mouth daily before breakfast for 25 days, THEN 8.3 mLs (25 mg total) daily before breakfast for 7 days, THEN 5 mLs (15 mg total) daily before breakfast for 7 days, THEN 3 mLs (9 mg total) daily before breakfast for 5 days. 08/06/22 09/19/22 Yes Johnson, Clanford L, MD  spironolactone (ALDACTONE) 100 MG tablet Take 1 tablet (100 mg total) by mouth daily. 08/06/22  Yes Murlean Iba, MD  thiamine (VITAMIN B-1) 100 MG tablet Take 1 tablet (100 mg total) by mouth daily. 08/06/22  Yes Johnson, Clanford L, MD  albuterol (VENTOLIN HFA) 108 (90 Base) MCG/ACT inhaler INHALE 2 PUFFS INTO THE LUNGS EVERY 6 HOURS AS NEEDED FOR WHEEZING OR SHORTNESS OF BREATH Patient not taking: Reported on 08/25/2022 05/18/21   Lindell Spar, MD  BELSOMRA 15 MG TABS Take 1 tablet by mouth daily. Patient not taking: Reported on 08/25/2022 07/22/22   [provider]  Continuous Blood Gluc Sensor (DEXCOM G7 SENSOR) MISC Inject 1 application. into the skin as directed.  Change sensor every 10 days as directed. 12/09/21   Brita Romp, NP  glucose blood test strip Use as instructed to monitor glucose 4 times daily 12/09/21   Brita Romp, NP  Insulin Pen Needle (PEN NEEDLES) 32G X 4 MM MISC 1 each by Does not apply route in the morning, at noon, in the evening, and at bedtime. Use to inject insulin 4 times daily 05/12/22   Brita Romp, NP  montelukast (SINGULAIR) 10 MG tablet TAKE 1 TABLET(10 MG) BY MOUTH AT BEDTIME Patient not taking: Reported on 08/25/2022 12/10/21   Renee Rival, FNP      Allergies    Neosporin + pain relief max st [neomy-bacit-polymyx-pramoxine]    Review of Systems   Review of Systems  Constitutional:  Positive for fatigue.  Gastrointestinal:  Positive for abdominal pain, blood in stool, nausea and vomiting.  Neurological:  Positive for weakness (Generalized).  All other systems reviewed and are negative.   Physical Exam Updated Vital Signs BP 104/65 (BP Location: Right Arm)   Pulse 98   Temp 97.6 F (36.4 C) (Oral)   Resp 19   Ht 5' 6"$  (1.676 m)   Wt 80.3 kg   SpO2 95%   BMI 28.57 kg/m  Physical Exam Vitals and nursing note reviewed.  Constitutional:      General: He is not in acute distress.    Appearance: Normal appearance. He is well-developed. He is ill-appearing. He is not toxic-appearing or diaphoretic.  HENT:     Head: Normocephalic and atraumatic.     Right Ear: External ear normal.     Left Ear: External ear normal.     Nose: Nose normal.     Mouth/Throat:     Mouth: Mucous membranes are moist.  Eyes:     General: Scleral icterus present.     Conjunctiva/sclera: Conjunctivae normal.  Cardiovascular:     Rate and Rhythm: Normal rate and regular rhythm.     Heart sounds: No murmur heard. Pulmonary:     Effort: Pulmonary effort is normal. No respiratory distress.  Abdominal:     General: There is no distension.     Palpations: Abdomen is soft.     Tenderness: There is no abdominal  tenderness.  Musculoskeletal:        General: No swelling. Normal range of motion.     Cervical back: Normal range of motion and neck supple.  Skin:    General: Skin is warm and dry.     Coloration: Skin is pale.  Neurological:     General: No focal deficit present.     Mental Status: He is alert and oriented to person, place, and time.     Cranial Nerves: No cranial nerve deficit.     Sensory: No sensory deficit.     Motor: No weakness.     Coordination: Coordination normal.  Psychiatric:  Mood and Affect: Mood normal.        Behavior: Behavior normal.        Thought Content: Thought content normal.        Judgment: Judgment normal.     ED Results / Procedures / Treatments   Labs (all labs ordered are listed, but only abnormal results are displayed) Labs Reviewed  LIPASE, BLOOD - Abnormal; Notable for the following components:      Result Value   Lipase 99 (*)    All other components within normal limits  COMPREHENSIVE METABOLIC PANEL - Abnormal; Notable for the following components:   Sodium 119 (*)    Chloride 81 (*)    Glucose, Bld 372 (*)    BUN 25 (*)    Calcium 7.9 (*)    Total Protein 5.8 (*)    Albumin 2.7 (*)    AST 126 (*)    ALT 74 (*)    Alkaline Phosphatase 149 (*)    Total Bilirubin 3.2 (*)    All other components within normal limits  CBC - Abnormal; Notable for the following components:   WBC 13.1 (*)    RBC 2.11 (*)    Hemoglobin 8.2 (*)    HCT 22.1 (*)    MCV 104.7 (*)    MCH 38.9 (*)    MCHC 37.1 (*)    Platelets 97 (*)    All other components within normal limits  PROTIME-INR - Abnormal; Notable for the following components:   Prothrombin Time 18.5 (*)    INR 1.6 (*)    All other components within normal limits  POC OCCULT BLOOD, ED - Abnormal; Notable for the following components:   Fecal Occult Bld POSITIVE (*)    All other components within normal limits  MAGNESIUM  TYPE AND SCREEN  PREPARE RBC (CROSSMATCH)  ABO/RH     EKG EKG Interpretation  Date/Time:  Wednesday August 25 2022 16:57:29 EST Ventricular Rate:  95 PR Interval:  180 QRS Duration: 120 QT Interval:  414 QTC Calculation: 521 R Axis:   33 Text Interpretation: Sinus rhythm IVCD, consider atypical RBBB Confirmed by Godfrey Pick [phone]) on 08/25/2022 6:17:22 PM  Radiology No results found.  Procedures Procedures    Medications Ordered in ED Medications  octreotide (SANDOSTATIN) 2 mcg/mL load via infusion 50 mcg (50 mcg Intravenous Bolus from Bag 08/25/22 1750)    And  octreotide (SANDOSTATIN) 500 mcg in sodium chloride 0.9 % 250 mL (2 mcg/mL) infusion (50 mcg/hr Intravenous New Bag/Given 08/25/22 1744)  pantoprazole (PROTONIX) 80 mg /NS 100 mL IVPB (has no administration in time range)  pantoprozole (PROTONIX) 80 mg /NS 100 mL infusion (has no administration in time range)  pantoprazole (PROTONIX) injection 40 mg (has no administration in time range)  0.9 %  sodium chloride infusion (Manually program via Guardrails IV Fluids) (has no administration in time range)  metoCLOPramide (REGLAN) injection 10 mg (has no administration in time range)  cefTRIAXone (ROCEPHIN) 2 g in sodium chloride 0.9 % 100 mL IVPB (2 g Intravenous New Bag/Given 08/25/22 1742)  sodium chloride 0.9 % bolus 1,000 mL (1,000 mLs Intravenous New Bag/Given 08/25/22 1742)    ED Course/ Medical Decision Making/ A&P                             Medical Decision Making Amount and/or Complexity of Data Reviewed Labs: ordered.  Risk Prescription drug management.   This patient presents  to the ED for concern of hematemesis, this involves an extensive number of treatment options, and is a complaint that carries with it a high risk of complications and morbidity.  The differential diagnosis includes gastritis, peptic ulcer, esophageal varices, fistula, epistaxis   Co morbidities that complicate the patient evaluation  T2DM, anxiety, HTN, alcohol dependence, HLD,  cirrhosis, sleep apnea, GERD   Additional history obtained:  Additional history obtained from patient's wife External records from outside source obtained and reviewed including EMR   Lab Tests:  I Ordered, and personally interpreted labs.  The pertinent results include: Acute drop in hemoglobin when compared from 1 week ago; leukocytosis is present.  Patient has acute on chronic hyponatremia with sodium today of 119.  This corrects to 125 when accounting for hyperglycemia.  This is close to his baseline.  Thrombocytopenia and elevations in hepatobiliary enzymes are consistent with prior lab work.   Cardiac Monitoring: / EKG:  The patient was maintained on a cardiac monitor.  I personally viewed and interpreted the cardiac monitored which showed an underlying rhythm of: Sinus rhythm    Consultations Obtained:  I requested consultation with the gastroenterologist, Dr. Gala Romney,  and discussed lab and imaging findings as well as pertinent plan - they recommend: Keep n.p.o., agree with Rocephin, Protonix, and octreotide.  GI will see in the morning.   Problem List / ED Course / Critical interventions / Medication management  Patient presents for 3 days of intermittent hematemesis.  Vital signs on arrival are notable for soft blood pressures.  On exam, patient is alert and oriented.  His abdomen is soft and without tenderness.  He describes mild abdominal pain left of umbilicus.  Sclerae are icteric.  Patient does appear pale.  His wife, who accompanies him at bedside, states that this has been his pallor over the past week.  Patient denies any GI bleeding prior to 3 days ago.  He is not on any blood thinning medications.  He did have recent diagnosis of cirrhosis.  He does continue to drink a small amount of alcohol.  Lab work is notable for 25% drop in hemoglobin over the past week, leukocytosis, and acute on chronic hyponatremia.  Sodium today is 119.  When corrected for his hyperglycemia,  sodium is close to baseline.  Normal saline IV fluid was ordered.  Per chart review, patient underwent CT imaging on recent hospitalization.  There was evidence of esophageal and gastric varices on imaging.  Given his recent hematemesis, there is concern of variceal bleed.  Ceftriaxone, octreotide, and Protonix were ordered.  Given his acute drop in hemoglobin which does appear to be symptomatic, 2 units of PRBCs was ordered.  Gastroenterology was consulted.I spoke with gastroenterologist, Dr. Gala Romney, who agrees with Rocephin, Protonix, and octreotide. He recommends keeping n.p.o.  He will plan on seeing in the morning.  Patient remained hemodynamically stable while in the ED.  Reglan was ordered for nausea.  Patient was admitted to hospitalist for further management. I ordered medication including PRBCs for symptomatic anemia; Rocephin, octreotide for empiric treatment of esophageal bleed; Protonix for upper GI bleed, IV fluids for hydration, Reglan for nausea Reevaluation of the patient after these medicines showed that the patient improved I have reviewed the patients home medicines and have made adjustments as needed   Social Determinants of Health:  Has access to outpatient care  CRITICAL CARE Performed by: Godfrey Pick   Total critical care time: 32 minutes  Critical care time was exclusive of separately  billable procedures and treating other patients.  Critical care was necessary to treat or prevent imminent or life-threatening deterioration.  Critical care was time spent personally by me on the following activities: development of treatment plan with patient and/or surrogate as well as nursing, discussions with consultants, evaluation of patient's response to treatment, examination of patient, obtaining history from patient or surrogate, ordering and performing treatments and interventions, ordering and review of laboratory studies, ordering and review of radiographic studies, pulse oximetry  and re-evaluation of patient's condition.         Final Clinical Impression(s) / ED Diagnoses Final diagnoses:  Upper GI bleed  Symptomatic anemia  Hyponatremia    Rx / DC Orders ED Discharge Orders     None         Godfrey Pick, MD 08/25/22 1820

## 2022-08-25 NOTE — Assessment & Plan Note (Signed)
-   HgbA1c- 5.2 - SSI- M -Hold home Lantus 40 units daily, Hold metformin - NPO

## 2022-08-25 NOTE — Assessment & Plan Note (Signed)
Elevated liver enzymes, thrombocytopenia platelet 97 (slight fluctuating 97-146), INR 1.6.  Ongoing alcohol abuse.  Last drink was last night.  Drinks wine daily.  No sign of withdrawal at this time - CIWA PRN - Thiamine, folate, multivitamins -Check Phos -Hemoglobin, platelets

## 2022-08-25 NOTE — Assessment & Plan Note (Signed)
Blood pressure soft, -Hold Lasix, spironolactone for now in the setting of GI bleed

## 2022-08-25 NOTE — Assessment & Plan Note (Signed)
Stable and chronically elevated AST 126, ALT 74, ALP 149.  Total bili 3.2.

## 2022-08-25 NOTE — ED Triage Notes (Signed)
Pt states with blood in emesis since Sunday x 5 with bright red.  Pt with bright red at times and black stools since Sunday.  Denies taking blood thinners.  Recent dx'd with cirrhosis of liver. Pt with c/o dizziness off and on since discharge from hospital.

## 2022-08-26 ENCOUNTER — Encounter (HOSPITAL_COMMUNITY)
Admission: EM | Disposition: A | Discharge status: Home / Self Care | Payer: Self-pay | Source: Home / Self Care | Attending: Internal Medicine

## 2022-08-26 ENCOUNTER — Encounter (HOSPITAL_COMMUNITY): Payer: Self-pay | Admitting: Internal Medicine

## 2022-08-26 ENCOUNTER — Inpatient Hospital Stay (HOSPITAL_COMMUNITY): Payer: BC Managed Care – PPO | Admitting: Anesthesiology

## 2022-08-26 DIAGNOSIS — K31819 Angiodysplasia of stomach and duodenum without bleeding: Secondary | ICD-10-CM

## 2022-08-26 DIAGNOSIS — I8510 Secondary esophageal varices without bleeding: Secondary | ICD-10-CM

## 2022-08-26 DIAGNOSIS — K922 Gastrointestinal hemorrhage, unspecified: Secondary | ICD-10-CM | POA: Diagnosis not present

## 2022-08-26 DIAGNOSIS — D62 Acute posthemorrhagic anemia: Secondary | ICD-10-CM

## 2022-08-26 HISTORY — PX: ESOPHAGEAL BANDING: SHX5518

## 2022-08-26 HISTORY — PX: ESOPHAGOGASTRODUODENOSCOPY (EGD) WITH PROPOFOL: SHX5813

## 2022-08-26 LAB — GLUCOSE, CAPILLARY
Glucose-Capillary: 121 mg/dL — ABNORMAL HIGH (ref 70–99)
Glucose-Capillary: 194 mg/dL — ABNORMAL HIGH (ref 70–99)
Glucose-Capillary: 245 mg/dL — ABNORMAL HIGH (ref 70–99)
Glucose-Capillary: 247 mg/dL — ABNORMAL HIGH (ref 70–99)
Glucose-Capillary: 75 mg/dL (ref 70–99)
Glucose-Capillary: 89 mg/dL (ref 70–99)
Glucose-Capillary: 98 mg/dL (ref 70–99)

## 2022-08-26 LAB — CBC
HCT: 23.8 % — ABNORMAL LOW (ref 39.0–52.0)
HCT: 28.3 % — ABNORMAL LOW (ref 39.0–52.0)
HCT: 26.8 % — ABNORMAL LOW (ref 39.0–52.0)
HCT: 28.4 % — ABNORMAL LOW (ref 39.0–52.0)
Hemoglobin: 9.1 g/dL — ABNORMAL LOW (ref 13.0–17.0)
Hemoglobin: 9.8 g/dL — ABNORMAL LOW (ref 13.0–17.0)
Hemoglobin: 9.5 g/dL — ABNORMAL LOW (ref 13.0–17.0)
Hemoglobin: 10.1 g/dL — ABNORMAL LOW (ref 13.0–17.0)
MCH: 37 pg — ABNORMAL HIGH (ref 26.0–34.0)
MCH: 34.8 pg — ABNORMAL HIGH (ref 26.0–34.0)
MCH: 35.1 pg — ABNORMAL HIGH (ref 26.0–34.0)
MCH: 34.6 pg — ABNORMAL HIGH (ref 26.0–34.0)
MCHC: 38.2 g/dL — ABNORMAL HIGH (ref 30.0–36.0)
MCHC: 34.6 g/dL (ref 30.0–36.0)
MCHC: 35.4 g/dL (ref 30.0–36.0)
MCHC: 35.6 g/dL (ref 30.0–36.0)
MCV: 96.7 fL (ref 80.0–100.0)
MCV: 100.4 fL — ABNORMAL HIGH (ref 80.0–100.0)
MCV: 98.9 fL (ref 80.0–100.0)
MCV: 97.3 fL (ref 80.0–100.0)
Platelets: 82 10*3/uL — ABNORMAL LOW (ref 150–400)
Platelets: 79 10*3/uL — ABNORMAL LOW (ref 150–400)
Platelets: 77 10*3/uL — ABNORMAL LOW (ref 150–400)
Platelets: 79 10*3/uL — ABNORMAL LOW (ref 150–400)
RBC: 2.46 MIL/uL — ABNORMAL LOW (ref 4.22–5.81)
RBC: 2.82 MIL/uL — ABNORMAL LOW (ref 4.22–5.81)
RBC: 2.71 MIL/uL — ABNORMAL LOW (ref 4.22–5.81)
RBC: 2.92 MIL/uL — ABNORMAL LOW (ref 4.22–5.81)
RDW: 18.9 % — ABNORMAL HIGH (ref 11.5–15.5)
RDW: 20 % — ABNORMAL HIGH (ref 11.5–15.5)
RDW: 20 % — ABNORMAL HIGH (ref 11.5–15.5)
RDW: 19.3 % — ABNORMAL HIGH (ref 11.5–15.5)
WBC: 13.4 10*3/uL — ABNORMAL HIGH (ref 4.0–10.5)
WBC: 12.7 10*3/uL — ABNORMAL HIGH (ref 4.0–10.5)
WBC: 11 10*3/uL — ABNORMAL HIGH (ref 4.0–10.5)
WBC: 10.7 10*3/uL — ABNORMAL HIGH (ref 4.0–10.5)
nRBC: 0 % (ref 0.0–0.2)
nRBC: 0 % (ref 0.0–0.2)
nRBC: 0 % (ref 0.0–0.2)
nRBC: 0 % (ref 0.0–0.2)

## 2022-08-26 LAB — HEMOGLOBIN AND HEMATOCRIT, BLOOD
HCT: 21.4 % — ABNORMAL LOW (ref 39.0–52.0)
Hemoglobin: 8 g/dL — ABNORMAL LOW (ref 13.0–17.0)

## 2022-08-26 LAB — COMPREHENSIVE METABOLIC PANEL
ALT: 61 U/L — ABNORMAL HIGH (ref 0–44)
AST: 97 U/L — ABNORMAL HIGH (ref 15–41)
Albumin: 2.3 g/dL — ABNORMAL LOW (ref 3.5–5.0)
Alkaline Phosphatase: 116 U/L (ref 38–126)
Anion gap: 9 (ref 5–15)
BUN: 24 mg/dL — ABNORMAL HIGH (ref 6–20)
CO2: 28 mmol/L (ref 22–32)
Calcium: 7.7 mg/dL — ABNORMAL LOW (ref 8.9–10.3)
Chloride: 92 mmol/L — ABNORMAL LOW (ref 98–111)
Creatinine, Ser: 1 mg/dL (ref 0.61–1.24)
GFR, Estimated: >60 mL/min (ref >60)
Glucose, Bld: 75 mg/dL (ref 70–99)
Potassium: 3.6 mmol/L (ref 3.5–5.1)
Sodium: 129 mmol/L — ABNORMAL LOW (ref 135–145)
Total Bilirubin: 4 mg/dL — ABNORMAL HIGH (ref 0.3–1.2)
Total Protein: 5 g/dL — ABNORMAL LOW (ref 6.5–8.1)

## 2022-08-26 LAB — MRSA NEXT GEN BY PCR, NASAL: MRSA by PCR Next Gen: NOT DETECTED

## 2022-08-26 LAB — MAGNESIUM: Magnesium: 2 mg/dL (ref 1.7–2.4)

## 2022-08-26 LAB — CBG MONITORING, ED: Glucose-Capillary: 125 mg/dL — ABNORMAL HIGH (ref 70–99)

## 2022-08-26 SURGERY — ESOPHAGOGASTRODUODENOSCOPY (EGD) WITH PROPOFOL
Anesthesia: General

## 2022-08-26 MED ORDER — CHLORHEXIDINE GLUCONATE CLOTH 2 % EX PADS
6.0000 | MEDICATED_PAD | Freq: Every day | CUTANEOUS | Status: DC
  Administered 2022-08-26 – 2022-08-29 (×3): 6 via TOPICAL

## 2022-08-26 MED ORDER — SODIUM CHLORIDE 0.9 % IV SOLN
2.0000 g | INTRAVENOUS | Status: DC
  Administered 2022-08-26 – 2022-08-29 (×4): 2 g via INTRAVENOUS
  Filled 2022-08-26 (×4): qty 20

## 2022-08-26 MED ORDER — FENTANYL CITRATE (PF) 100 MCG/2ML IJ SOLN
INTRAMUSCULAR | Status: AC
  Filled 2022-08-26: qty 2

## 2022-08-26 MED ORDER — LIDOCAINE HCL (CARDIAC) PF 50 MG/5ML IV SOSY
PREFILLED_SYRINGE | INTRAVENOUS | Status: DC | PRN
  Administered 2022-08-26: 100 mg via INTRAVENOUS

## 2022-08-26 MED ORDER — LACTATED RINGERS IV SOLN: INTRAVENOUS | Status: DC

## 2022-08-26 MED ORDER — FENTANYL CITRATE (PF) 100 MCG/2ML IJ SOLN
INTRAMUSCULAR | Status: DC | PRN
  Administered 2022-08-26: 50 ug via INTRAVENOUS

## 2022-08-26 MED ORDER — DEXTROSE-NACL 5-0.9 % IV SOLN: INTRAVENOUS | Status: DC

## 2022-08-26 MED ORDER — SODIUM CHLORIDE 0.9 % IV SOLN: INTRAVENOUS | Status: DC

## 2022-08-26 MED ORDER — PROPOFOL 10 MG/ML IV BOLUS
INTRAVENOUS | Status: AC
  Filled 2022-08-26: qty 20

## 2022-08-26 MED ORDER — LACTULOSE 10 GM/15ML PO SOLN
20.0000 g | Freq: Two times a day (BID) | ORAL | Status: DC
  Administered 2022-08-26 – 2022-08-27 (×2): 20 g via ORAL
  Filled 2022-08-26 (×2): qty 30

## 2022-08-26 MED ORDER — SUCCINYLCHOLINE CHLORIDE 200 MG/10ML IV SOSY
PREFILLED_SYRINGE | INTRAVENOUS | Status: DC | PRN
  Administered 2022-08-26: 120 mg via INTRAVENOUS

## 2022-08-26 MED ORDER — PROPOFOL 10 MG/ML IV BOLUS
INTRAVENOUS | Status: DC | PRN
  Administered 2022-08-26: 140 mg via INTRAVENOUS

## 2022-08-26 NOTE — Anesthesia Postprocedure Evaluation (Signed)
Anesthesia Post Note  Patient: Shawn Velazquez  Procedure(s) Performed: ESOPHAGOGASTRODUODENOSCOPY (EGD) WITH PROPOFOL ESOPHAGEAL BANDING  Patient location during evaluation: Phase II Anesthesia Type: General Level of consciousness: awake and alert and oriented Pain management: pain level controlled Vital Signs Assessment: post-procedure vital signs reviewed and stable Respiratory status: spontaneous breathing, nonlabored ventilation and respiratory function stable Cardiovascular status: blood pressure returned to baseline and stable Postop Assessment: no apparent nausea or vomiting Anesthetic complications: no  No notable events documented.   Last Vitals:  Vitals:   08/26/22 1200 08/26/22 1215  BP: 134/83 132/84  Pulse: 89 86  Resp: 20 16  Temp:    SpO2: 99% 100%    Last Pain:  Vitals:   08/26/22 1200  TempSrc:   PainSc: 6                  Rafik Koppel C Dezyre Hoefer

## 2022-08-26 NOTE — Interval H&P Note (Signed)
History and Physical Interval Note:  08/26/2022 10:47 AM  Shawn Velazquez  has presented today for surgery, with the diagnosis of hematemesis, melena, cirrhosis.  The various methods of treatment have been discussed with the patient and family. After consideration of risks, benefits and other options for treatment, the patient has consented to  Procedure(s): ESOPHAGOGASTRODUODENOSCOPY (EGD) WITH PROPOFOL (N/A) ESOPHAGEAL BANDING (N/A) as a surgical intervention.  The patient's history has been reviewed, patient examined, no change in status, stable for surgery.  I have reviewed the patient's chart and labs.  Questions were answered to the patient's satisfaction.     [name]

## 2022-08-26 NOTE — TOC Initial Note (Addendum)
Transition of Care [facility]) - Initial/Assessment Note    Patient Details  Name: Shawn Velazquez MRN: [id number] Date of Birth: 1966/01/14  Transition of [facility] CM/SW Contact:    Shade Flood, LCSW Phone Number: 08/26/2022, 11:17 AM  Clinical Narrative:                  Pt admitted from home. He has a high readmission risk score. Pt resides in his own home and is independent in ADLs. Pt has insurance though his employer, has a PCP, and pharmacy.   SA treatment resource list added to AVS.  Will follow and assist if dc planning needs arise.  Met with pt's family at their request to provide information on process for pt to apply for disability. Verbal and written information provided. Family expressed understanding.  Expected Discharge Plan: Home/Self Care Barriers to Discharge: Continued Medical Work up   Patient Goals and CMS Choice Patient states their goals for this hospitalization and ongoing recovery are:: go home          Expected Discharge Plan and Services In-house Referral: Clinical Social Work     Living arrangements for the past 2 months: Single Family Home                                      Prior Living Arrangements/Services Living arrangements for the past 2 months: Single Family Home Lives with:: Self Patient language and need for interpreter reviewed:: Yes Do you feel safe going back to the place where you live?: Yes      Need for Family Participation in Patient Care: No (Comment)     Criminal Activity/Legal Involvement Pertinent to Current Situation/Hospitalization: No - Comment as needed  Activities of Daily Living Home Assistive Devices/Equipment: Eyeglasses ADL Screening (condition at time of admission) Patient's cognitive ability adequate to safely complete daily activities?: Yes Is the patient deaf or have difficulty hearing?: No Does the patient have difficulty seeing, even when wearing glasses/contacts?: No Does the patient  have difficulty concentrating, remembering, or making decisions?: No Patient able to express need for assistance with ADLs?: No Does the patient have difficulty dressing or bathing?: No Independently performs ADLs?: Yes (appropriate for developmental age) Does the patient have difficulty walking or climbing stairs?: No Weakness of Legs: None Weakness of Arms/Hands: None  Permission Sought/Granted                  Emotional Assessment       Orientation: : Oriented to Self, Oriented to Place, Oriented to  Time, Oriented to Situation Alcohol / Substance Use: Alcohol Use Psych Involvement: No (comment)  Admission diagnosis:  Hyponatremia [E87.1] Upper GI bleed [K92.2] Symptomatic anemia [D64.9] Patient Active Problem List   Diagnosis Date Noted   ABLA (acute blood loss anemia) 08/26/2022   Upper GI bleed 08/25/2022   Hyponatremia 08/25/2022   DM (diabetes mellitus) ([place]) 08/25/2022   Prolonged QT interval 08/25/2022   Symptomatic anemia [id number]   Alcoholic hepatitis with ascites [id number]   Alcoholic cirrhosis of liver with ascites ([place]) 08/03/2022   Hypokalemia 08/03/2022   Anasarca 08/02/2022   Alcohol abuse 08/02/2022   Lower abdominal pain 05/12/2022   Elevated LFTs 05/12/2022   Class 1 obesity 10/29/2021   Anxiety and depression 08/01/2021   Acute bronchitis 07/21/2021   Respiratory illness 07/21/2021   Uncontrolled type 2 diabetes mellitus with hyperglycemia ([place]) 07/01/2020  Anxiety 07/01/2020   Hypertension, essential 07/01/2020   Alcohol use disorder, moderate, dependence ([place]) 07/01/2020   Hyperlipidemia 07/01/2020   Screening for colon cancer 07/01/2020   HNP (herniated nucleus pulposus), thoracic 04/03/2014   PCP:  Celene Squibb, MD Pharmacy:   [facility] [address] [facility] [address] [place] [address] Phone: [phone] Fax: [phone]     Social  Determinants of Health (SDOH) Social History: SDOH Screenings   Food Insecurity: No Food Insecurity (08/26/2022)  Housing: Low Risk  (08/26/2022)  Transportation Needs: No Transportation Needs (08/26/2022)  Utilities: Not At Risk (08/26/2022)  Depression (PHQ2-9): Medium Risk (08/18/2022)  Tobacco Use: High Risk (08/26/2022)   SDOH Interventions:     Readmission Risk Interventions    08/26/2022   11:17 AM  Readmission Risk Prevention Plan  Transportation Screening Complete  Home Care Screening Complete  Medication Review (RN CM) Complete

## 2022-08-26 NOTE — H&P (View-Only) (Signed)
Gastroenterology Consult   Referring Provider: No ref. provider found Primary Care Physician:  Celene Squibb, MD Primary Gastroenterologist:  Elon Alas. Abbey Chatters, DO  Patient ID: Shawn Velazquez; [id number]; 1966/05/03   Admit date: 08/25/2022  LOS: 1 day   Date of Consultation: 08/26/2022  Reason for Consultation:  GI bleed    History of Present Illness   Shawn Velazquez is a 57 y.o. male with PMH significant for alcoholic liver cirrhosis, ongoing alcohol dependence, sleep apnea, DM, HTN presented to ED with vomiting blood for 3 days. Also reports black stools. Chronic brbpr.   Recent admission for decompensated liver disease (anasarca), discharged on 08/05/22.   In ED: Lipase 99, sodium 119, glucose 372, BUN 25, creatinine 1.17, total bilirubin 3.2, alkaline phosphatase 149, AST 126, ALT 74, albumin 2.7, white blood cell count 13,100, hemoglobin 8.2 (down from 12.91-week prior), hematocrit 22.1, MCV 104.7, platelets 97,000, INR 1.6.  Today hemoglobin 9.1 after 2 units of packed red blood cells.  Patient reports vomiting blood for 3 days. Last episode was yesterday morning, bright red. Also passing black stools. Has intermittent brbpr with up to date colonoscopy. Some heartburn. No dysphagia. Intermittent vague abdominal pain. Continues to drink etoh, one 16 ounce can/wine cooler each night to help him sleep. Lower extremity edema is improved. Less abdominal distention. Patient complains of intermittent sluggishness, mild confusion over the past one month. BM most days. Has had some mild tremors.  Patient discharged earlier this month on prednisolone for etoh hepatitis. He did not complete labs as requested on 08/09/22.   Colonoscopy 04/05/2022: -Preparation of the colon was fair. -Non-bleeding internal hemorrhoids. -Two 2 to 3 mm polyps in the cecum, removed with a cold snare. Resected and retrieved. -Three 4 to 8 mm polyps in the transverse colon, removed with a cold snare.  Resected and retrieved. -One 8 mm polyp in the descending colon, removed with a cold snare. Resected and retrieved. -The examination was otherwise normal. -Tubular adenomas -Three year surveillance.     Prior to Admission medications   Medication Sig Start Date End Date Taking? Authorizing Provider  acetaminophen (TYLENOL) 325 MG tablet Take 650 mg by mouth as needed for mild pain, headache or moderate pain.   Yes [provider]  FLUoxetine (PROZAC) 10 MG capsule TAKE 4 CAPSULES BY MOUTH EVERY DAY Patient taking differently: Take 10 mg by mouth See admin instructions. TAKE 2 CAPSULES IN THE MORNING AND 2 CAPSULES EVERY EVENING. 02/17/22  Yes Paseda, Dewaine Conger, FNP  furosemide (LASIX) 40 MG tablet Take 1 tablet (40 mg total) by mouth daily. 08/06/22 08/06/23 Yes Johnson, Clanford L, MD  insulin glargine (LANTUS SOLOSTAR) 100 UNIT/ML Solostar Pen Inject 40 Units into the skin at bedtime. 08/05/22  Yes Johnson, Clanford L, MD  insulin lispro (HUMALOG KWIKPEN) 100 UNIT/ML KwikPen Inject 8-14 Units into the skin 3 (three) times daily. Patient taking differently: Inject 8-14 Units into the skin 3 (three) times daily. PER SLIDING SCALE 05/12/22  Yes Brita Romp, NP  meclizine (ANTIVERT) 12.5 MG tablet Take 12.5 mg by mouth 3 (three) times daily as needed for dizziness or nausea. 08/20/22  Yes [provider]  metFORMIN (GLUCOPHAGE-XR) 500 MG 24 hr tablet Take 2 tablets (1,000 mg total) by mouth 2 (two) times daily with a meal. 08/05/22  Yes Johnson, Clanford L, MD  Multiple Vitamin (MULTIVITAMIN) tablet Take 1 tablet by mouth daily.   Yes [provider]  omeprazole (PRILOSEC OTC) 20 MG tablet  Take 20 mg by mouth every other day.   Yes [provider]  ondansetron (ZOFRAN-ODT) 4 MG disintegrating tablet Take 4 mg by mouth 2 (two) times daily. FOR 14 DAYS START ON 08/20/22 08/20/22  Yes [provider]  OVER THE COUNTER MEDICATION Take 1 tablet by mouth  daily. Melatonin   Yes [provider]  prednisoLONE (PRELONE) 15 MG/5ML SOLN Take 13.3 mLs (40 mg total) by mouth daily before breakfast for 25 days, THEN 8.3 mLs (25 mg total) daily before breakfast for 7 days, THEN 5 mLs (15 mg total) daily before breakfast for 7 days, THEN 3 mLs (9 mg total) daily before breakfast for 5 days. 08/06/22 09/19/22 Yes Johnson, Clanford L, MD  spironolactone (ALDACTONE) 100 MG tablet Take 1 tablet (100 mg total) by mouth daily. 08/06/22  Yes Johnson, Clanford L, MD  thiamine (VITAMIN B-1) 100 MG tablet Take 1 tablet (100 mg total) by mouth daily. 08/06/22  Yes Johnson, Clanford L, MD  albuterol (VENTOLIN HFA) 108 (90 Base) MCG/ACT inhaler INHALE 2 PUFFS INTO THE LUNGS EVERY 6 HOURS AS NEEDED FOR WHEEZING OR SHORTNESS OF BREATH Patient not taking: Reported on 08/25/2022 05/18/21   Lindell Spar, MD  BELSOMRA 15 MG TABS Take 1 tablet by mouth daily. Patient not taking: Reported on 08/25/2022 07/22/22   [provider]  Continuous Blood Gluc Sensor (DEXCOM G7 SENSOR) MISC Inject 1 application. into the skin as directed. Change sensor every 10 days as directed. 12/09/21   Brita Romp, NP  glucose blood test strip Use as instructed to monitor glucose 4 times daily 12/09/21   Brita Romp, NP  Insulin Pen Needle (PEN NEEDLES) 32G X 4 MM MISC 1 each by Does not apply route in the morning, at noon, in the evening, and at bedtime. Use to inject insulin 4 times daily 05/12/22   Brita Romp, NP  montelukast (SINGULAIR) 10 MG tablet TAKE 1 TABLET(10 MG) BY MOUTH AT BEDTIME Patient not taking: Reported on 08/25/2022 12/10/21   Renee Rival, FNP    Current Facility-Administered Medications  Medication Dose Route Frequency Provider Last Rate Last Admin   cefTRIAXone (ROCEPHIN) 2 g in sodium chloride 0.9 % 100 mL IVPB  2 g Intravenous Q24H Mahala Menghini, PA-C       Chlorhexidine Gluconate Cloth 2 % PADS 6 each  6 each Topical Daily Emokpae, Ejiroghene  E, MD   6 each at 08/26/22 0405   dextrose 5 %-0.9 % sodium chloride infusion   Intravenous Continuous Heath Lark D, DO 75 mL/hr at 08/26/22 0846 New Bag at [id number]   folic acid (FOLVITE) tablet 1 mg  1 mg Oral Daily Emokpae, Ejiroghene E, MD       insulin aspart (novoLOG) injection 0-15 Units  0-15 Units Subcutaneous Q4H Emokpae, Ejiroghene E, MD   5 Units at 08/25/22 2351   LORazepam (ATIVAN) tablet 1-4 mg  1-4 mg Oral Q1H PRN Emokpae, Ejiroghene E, MD       Or   LORazepam (ATIVAN) injection 1-4 mg  1-4 mg Intravenous Q1H PRN Emokpae, Ejiroghene E, MD   2 mg at 08/25/22 2351   multivitamin with minerals tablet 1 tablet  1 tablet Oral Daily Emokpae, Ejiroghene E, MD       octreotide (SANDOSTATIN) 500 mcg in sodium chloride 0.9 % 250 mL (2 mcg/mL) infusion  50 mcg/hr Intravenous Continuous Emokpae, Ejiroghene E, MD 25 mL/hr at 08/26/22 0634 50 mcg/hr at 08/26/22 [id number]   [  START ON 08/29/2022] pantoprazole (PROTONIX) injection 40 mg  40 mg Intravenous Q12H Emokpae, Ejiroghene E, MD       pantoprozole (PROTONIX) 80 mg /NS 100 mL infusion  8 mg/hr Intravenous Continuous Emokpae, Ejiroghene E, MD 10 mL/hr at 08/26/22 0634 8 mg/hr at 08/26/22 [id number]   polyethylene glycol (MIRALAX / GLYCOLAX) packet 17 g  17 g Oral Daily PRN Emokpae, Ejiroghene E, MD       thiamine (VITAMIN B1) tablet 100 mg  100 mg Oral Daily Emokpae, Ejiroghene E, MD       Or   thiamine (VITAMIN B1) injection 100 mg  100 mg Intravenous Daily Emokpae, Ejiroghene E, MD        Allergies as of 08/25/2022 - Review Complete 08/25/2022  Allergen Reaction Noted   Neosporin + pain relief max st [neomy-bacit-polymyx-pramoxine] Rash 04/19/2012    Past Medical History:  Diagnosis Date   Anxiety    Arthritis    affects hands, shoulder, neck, knees, hips, ankles, toes   Chronic back pain    Diabetes mellitus    diet controlled   Eczema    Fatty liver, alcoholic    GERD (gastroesophageal reflux disease)    Glaucoma    slight case    Hypertension    Psoriasis    Sleep apnea    Substance abuse ([place])    Vertigo 09/11/2017   Wears glasses     Past Surgical History:  Procedure Laterality Date   COLONOSCOPY WITH PROPOFOL N/A 04/05/2022   Procedure: COLONOSCOPY WITH PROPOFOL;  Surgeon: [name], DO;  Location: [facility];  Service: Endoscopy;  Laterality: N/A;  10:45 AM,unable to reach pt to move up   [place]     POLYPECTOMY  04/05/2022   Procedure: POLYPECTOMY;  Surgeon: [name], DO;  Location: [facility];  Service: Endoscopy;;   POSTERIOR CERVICAL LAMINECTOMY  04/21/2012   Procedure: POSTERIOR CERVICAL LAMINECTOMY;  Surgeon: Elaina Hoops, MD;  Location: [facility];  Service: Neurosurgery;  Laterality: Left;  Posterior Cervical laminectomy/foraminotomy and diskectomy, left cervical seven-thoracic one   ROTATOR CUFF REPAIR     THORACIC DISCECTOMY Left 04/03/2014   Procedure: Left Thoracic Seven to Eight, Thoracic Eight to Nine Thoracic Discectomy ;  Surgeon: Elaina Hoops, MD;  Location: [facility];  Service: Neurosurgery;  Laterality: Left;    Family History  Problem Relation Age of Onset   Diabetes Father    Cancer - Lung Father    Hypertension Other    Cancer - Lung Other     Social History   Socioeconomic History   Marital status: Divorced    Spouse name: Not on file   Number of children: Not on file   Years of education: Not on file   Highest education level: Not on file  Occupational History    Comment: Truck to storage  Tobacco Use   Smoking status: Every Day    Packs/day: 0.25    Years: 40.00    Total pack years: 10.00    Types: Cigarettes    Last attempt to quit: 05/09/2022    Years since quitting: 0.2   Smokeless tobacco: Never   Tobacco comments:    pt trying to quit on his own  Vaping Use   Vaping Use: Former  Substance and Sexual Activity   Alcohol use: Yes    Alcohol/week: 12.0 standard drinks of alcohol    Types: 12 Cans of beer per week  Comment: Either Smirnoff Smash-4 per day or Fireball-10 airplane bottles in a day   Drug use: No   Sexual activity: Yes    Birth control/protection: None  Other Topics Concern   Not on file  Social History Narrative   Not on file   Social Determinants of Health   Financial Resource Strain: Not on file  Food Insecurity: No Food Insecurity (08/26/2022)   Hunger Vital Sign    Worried About Running Out of Food in the Last Year: Never true    Ran Out of Food in the Last Year: Never true  Transportation Needs: No Transportation Needs (08/26/2022)   PRAPARE - Transportation    Lack of Transportation (Medical): No    Lack of Transportation (Non-Medical): No  Physical Activity: Not on file  Stress: Not on file  Social Connections: Not on file  Intimate Partner Violence: Not At Risk (08/26/2022)   Humiliation, Afraid, Rape, and Kick questionnaire    Fear of Current or Ex-Partner: No    Emotionally Abused: No    Physically Abused: No    Sexually Abused: No     Review of System:   General: Negative for anorexia, weight loss, fever, chills, fatigue, +weakness. Eyes: Negative for vision changes.  ENT: Negative for hoarseness, difficulty swallowing , nasal congestion. CV: Negative for chest pain, angina, palpitations, dyspnea on exertion, peripheral edema.  Respiratory: Negative for dyspnea at rest, dyspnea on exertion, cough, sputum, wheezing.  GI: See history of present illness. GU:  Negative for dysuria, hematuria, urinary incontinence, urinary frequency, nocturnal urination.  MS: Negative for joint pain, low back pain.  Derm: Negative for rash or itching.  Neuro: Negative for weakness, abnormal sensation, seizure, frequent headaches, memory loss, +confusion.  Psych: Negative for anxiety, depression, suicidal ideation, hallucinations.  Endo: Negative for unusual weight change.  Heme: Negative for bruising or bleeding. Allergy: Negative for rash or hives.      Physical  Examination:   Vital signs in last 24 hours: Temp:  [97.6 F (36.4 C)-99 F (37.2 C)] 98.6 F (37 C) (02/22 0741) Pulse Rate:  [80-98] 80 (02/22 0600) Resp:  [12-27] 27 (02/22 0600) BP: (93-113)/(60-79) 112/72 (02/22 0600) SpO2:  [78 %-100 %] 100 % (02/22 0600) Weight:  [80.3 kg] 80.3 kg (02/21 1534) Last BM Date : 08/25/22  General: chronically ill appearing, male, alert and cooperative.   Head: Normocephalic, atraumatic.   Eyes: Conjunctiva pale, mild icterus. Mouth: Oropharyngeal mucosa moist and pink , no lesions erythema or exudate. Neck: Supple without thyromegaly, masses, or lymphadenopathy.  Lungs: Clear to auscultation bilaterally.  Heart: Regular rate and rhythm, no murmurs rubs or gallops.  Abdomen: Bowel sounds are normal, nontender, nondistended, no hepatosplenomegaly or masses, no abdominal bruits or hernia , no rebound or guarding.   Rectal: not performed Extremities: 1-2+ pedal edema bilaterally. No clubbing, deformity.  Neuro: Alert and oriented x 4 , grossly normal neurologically.  Skin: Warm and dry, no rash or jaundice.   Psych: Alert and cooperative, normal mood and affect.        Intake/Output from previous day: 02/21 0701 - 02/22 0700 In: 1009.3 [I.V.:370.3; Blood:639] Out: 400 [Urine:400] Intake/Output this shift: No intake/output data recorded.  Lab Results:   CBC Recent Labs    08/25/22 1558 08/26/22 0116 08/26/22 0608  WBC 13.1*  --  13.4*  HGB 8.2* 8.0* 9.1*  HCT 22.1* 21.4* 23.8*  MCV 104.7*  --  96.7  PLT 97*  --  82*   BMET Recent  Labs    08/25/22 1558  NA 119*  K 5.0  CL 81*  CO2 27  GLUCOSE 372*  BUN 25*  CREATININE 1.17  CALCIUM 7.9*   LFT Recent Labs    08/25/22 1558  BILITOT 3.2*  ALKPHOS 149*  AST 126*  ALT 74*  PROT 5.8*  ALBUMIN 2.7*    Lipase Recent Labs    08/25/22 1558  LIPASE 99*    PT/INR Recent Labs    08/25/22 1558  LABPROT 18.5*  INR 1.6*     Hepatitis Panel No results for  input(s): "HEPBSAG", "HCVAB", "HEPAIGM", "HEPBIGM" in the last 72 hours.   Imaging Studies:   MR LIVER W WO CONTRAST  Result Date: 08/03/2022 CLINICAL DATA:  Cirrhosis.  [place]. EXAM: MRI ABDOMEN WITHOUT AND WITH CONTRAST TECHNIQUE: Multiplanar multisequence MR imaging of the abdomen was performed both before and after the administration of intravenous contrast. CONTRAST:  80m GADAVIST GADOBUTROL 1 MMOL/ML IV SOLN COMPARISON:  CT 08/02/2022 and older. FINDINGS: Lower chest: Trace pleural fluid. Breathing motion along several series. Hepatobiliary: There is a micronodular liver. Overall liver is slightly enlarged. The liver is without areas of restricted diffusion. No hypervascular lesions with washout. The tiny areas of low-density on the prior CT scan are again seen as small subcentimeter foci which are dark on T2 foci which do not clearly show abnormal enhancement and may be multiple regenerative nodules. Main portal vein has a diameter of 13 mm of the liver hilum. No biliary ductal dilatation. Gallbladder is nondilated. Gallstones Pancreas: Mild pancreatic atrophy. No pancreatic duct dilatation. No enhancing pancreatic mass. Spleen: Spleen has preserved enhancement. Spleen is enlarged at 14.4 cm in AP length. Adrenals/Urinary Tract: Adrenal glands are preserved. No enhancing renal mass or collecting system dilatation. Stomach/Bowel: Stomach is nondilated. Mild gastric fold thickening. The visualized bowel is nondilated. Normal retrocecal appendix. Vascular/Lymphatic: Normal caliber aorta and IVC. There are scattered varices identified including along the lower esophagus, upper stomach. There are several prominent lymph nodes identified in the upper abdomen including periceliac. Example series 14, image 42 measuring 2.3 x 0.8 cm. Portacaval node series 14, image 43 measuring 20 by 10 mm. These are nonspecific. Other:  Anasarca.  Scattered mesenteric haziness and ascites. Musculoskeletal: Focal L3 spinal  hemangioma. IMPRESSION: Evidence of chronic liver disease. Micronodular liver with multiple presumed regenerative nodules. No clear aggressive appearing lesions with restricted diffusion, hypervascularity or washout at this time. Mild ascites. Varices including along the stomach and lower esophagus. Mild splenic enlargement. The portal vein is patent. Nonspecific upper abdominal nodes. Electronically Signed   By: AJill SideM.D.   On: 08/03/2022 18:40   ECHOCARDIOGRAM COMPLETE  Result Date: 08/03/2022    ECHOCARDIOGRAM REPORT   Patient Name:   SXAMIR ROHLIKDate of Exam: 08/03/2022 Medical Rec #:  [id number]        Height:       66.0 in Accession #:    [id number]       Weight:       199.3 lb Date of Birth:  107-13-1967       BSA:          1.997 m Patient Age:    89years          BP:           137/77 mmHg Patient Gender: M                 HR:  74 bpm. Exam Location:  [name] Procedure: 2D Echo, 3D Echo, Cardiac Doppler, Color Doppler and Strain Analysis Indications:    Dyspnea R06.00  History:        Patient has no prior history of Echocardiogram examinations.                 Signs/Symptoms:Dyspnea and Edema; Risk Factors:Hypertension,                 Diabetes, Dyslipidemia, Former Smoker and Sleep Apnea.  Sonographer:    Greer Pickerel Referring Phys: [phone] DAVID TAT  Sonographer Comments: Image acquisition challenging due to patient body habitus and Image acquisition challenging due to respiratory motion. Global longitudinal strain was attempted. IMPRESSIONS  1. Left ventricular ejection fraction, by estimation, is 60 to 65%. The left ventricle has normal function. The left ventricle has no regional wall motion abnormalities. There is mild left ventricular hypertrophy. Left ventricular diastolic parameters were normal. The average left ventricular global longitudinal strain is -21.1 %. The global longitudinal strain is normal.  2. Right ventricular systolic function was not well visualized. The  right ventricular size is normal.  3. Left atrial size was moderately dilated.  4. The mitral valve is grossly normal. No evidence of mitral valve regurgitation. No evidence of mitral stenosis.  5. The aortic valve was not well visualized. There is moderate calcification of the aortic valve. Aortic valve regurgitation is not visualized. No aortic stenosis is present. Comparison(s): No prior Echocardiogram. FINDINGS  Left Ventricle: Left ventricular ejection fraction, by estimation, is 60 to 65%. The left ventricle has normal function. The left ventricle has no regional wall motion abnormalities. The average left ventricular global longitudinal strain is -21.1 %. The global longitudinal strain is normal. The left ventricular internal cavity size was normal in size. There is mild left ventricular hypertrophy. Left ventricular diastolic parameters were normal. Right Ventricle: The right ventricular size is normal. No increase in right ventricular wall thickness. Right ventricular systolic function was not well visualized. Left Atrium: Left atrial size was moderately dilated. Right Atrium: Right atrial size was normal in size. Pericardium: There is no evidence of pericardial effusion. Mitral Valve: The mitral valve is grossly normal. No evidence of mitral valve regurgitation. No evidence of mitral valve stenosis. Tricuspid Valve: The tricuspid valve is grossly normal. Tricuspid valve regurgitation is not demonstrated. No evidence of tricuspid stenosis. Aortic Valve: The aortic valve was not well visualized. There is moderate calcification of the aortic valve. Aortic valve regurgitation is not visualized. No aortic stenosis is present. Pulmonic Valve: The pulmonic valve was not well visualized. Pulmonic valve regurgitation is not visualized. No evidence of pulmonic stenosis. Aorta: The aortic root and ascending aorta are structurally normal, with no evidence of dilitation. Venous: The inferior vena cava was not well  visualized. IAS/Shunts: The interatrial septum was not well visualized.  LEFT VENTRICLE PLAX 2D LVIDd:         4.60 cm   Diastology LVIDs:         2.90 cm   LV e' medial:    7.77 cm/s LV PW:         1.20 cm   LV E/e' medial:  12.5 LV IVS:        0.90 cm   LV e' lateral:   11.70 cm/s LVOT diam:     2.10 cm   LV E/e' lateral: 8.3 LV SV:         70 LV SV Index:   35  2D Longitudinal Strain LVOT Area:     3.46 cm  2D Strain GLS Avg:     -21.1 %                           3D Volume EF:                          3D EF:        56 %                          LV EDV:       221 ml                          LV ESV:       97 ml                          LV SV:        124 ml RIGHT VENTRICLE RV S prime:     13.50 cm/s TAPSE (M-mode): 1.8 cm LEFT ATRIUM             Index        RIGHT ATRIUM           Index LA diam:        3.80 cm 1.90 cm/m   RA Area:     12.50 cm LA Vol (A2C):   74.4 ml 37.26 ml/m  RA Volume:   21.10 ml  10.57 ml/m LA Vol (A4C):   78.7 ml 39.41 ml/m LA Biplane Vol: 81.5 ml 40.82 ml/m  AORTIC VALVE LVOT Vmax:   106.00 cm/s LVOT Vmean:  68.400 cm/s LVOT VTI:    0.203 m  AORTA Ao Root diam: 3.40 cm Ao Asc diam:  3.00 cm MITRAL VALVE               TRICUSPID VALVE MV Area (PHT): 4.31 cm    TR Peak grad:   4.7 mmHg MV Decel Time: 176 msec    TR Vmax:        108.00 cm/s MV E velocity: 97.40 cm/s MV A velocity: 81.50 cm/s  SHUNTS MV E/A ratio:  1.20        Systemic VTI:  0.20 m                            Systemic Diam: 2.10 cm Vishnu Priya Mallipeddi Electronically signed by Lorelee Cover Mallipeddi Signature Date/Time: 08/03/2022/1:10:55 PM    Final    US Paracentesis  Result Date: 08/02/2022 INDICATION: Ascites EXAM: ULTRASOUND GUIDED DIAGNOSTIC AND THERAPEUTIC PARACENTESIS MEDICATIONS: None. COMPLICATIONS: None immediate. PROCEDURE: Informed written consent was obtained from the patient after a discussion of the risks, benefits and alternatives to treatment. A timeout was performed prior to the initiation of  the procedure. Initial ultrasound scanning demonstrates a large amount of ascites within the right lower abdominal quadrant. The right lower abdomen was prepped and draped in the usual sterile fashion. 1% lidocaine was used for local anesthesia. Following this, a 19 gauge, 7-cm, Yueh catheter was introduced. An ultrasound image was saved for documentation purposes. The paracentesis was performed. The catheter was removed and a dressing was applied. The patient tolerated the procedure well without immediate post procedural complication. Patient received post-procedure intravenous  albumin; see nursing notes for details. FINDINGS: A total of approximately 3.5 L of yellow ascitic fluid was removed. Samples were sent to the laboratory as requested by the clinical team. IMPRESSION: Successful ultrasound-guided paracentesis yielding 3.5 liters of peritoneal fluid. Electronically Signed   By: Lavonia Dana M.D.   On: 08/02/2022 16:52   DG Chest Portable 1 View  Result Date: 08/02/2022 CLINICAL DATA:  Shortness of breath EXAM: PORTABLE CHEST 1 VIEW COMPARISON:  09/11/2017 FINDINGS: The heart size and mediastinal contours are within normal limits. Mild diffuse interstitial opacity. The visualized skeletal structures are unremarkable. IMPRESSION: Mild diffuse bilateral interstitial pulmonary opacity, suggesting edema or atypical/viral infection. No focal airspace opacity. Electronically Signed   By: Delanna Ahmadi M.D.   On: 08/02/2022 14:27   CT ABDOMEN PELVIS W CONTRAST  Result Date: 08/02/2022 CLINICAL DATA:  Abdominal pain, weakness, leg swelling and abdominal swelling for 1 week. EXAM: CT ABDOMEN AND PELVIS WITH CONTRAST TECHNIQUE: Multidetector CT imaging of the abdomen and pelvis was performed using the standard protocol following bolus administration of intravenous contrast. RADIATION DOSE REDUCTION: This exam was performed according to the departmental dose-optimization program which includes automated exposure  control, adjustment of the mA and/or kV according to patient size and/or use of iterative reconstruction technique. CONTRAST:  122m OMNIPAQUE IOHEXOL 300 MG/ML  SOLN COMPARISON:  02/21/2014 FINDINGS: Lower chest: No acute abnormality. No pleural fluid or airspace consolidation. Hepatobiliary: There is hypertrophy of the lateral segment of left hepatic lobe and caudate lobe of liver. The contour the liver appears irregular and nodular. Heterogeneous appearance of the liver parenchyma with numerous tiny low-density foci are scattered throughout which measure up to 7 mm. These are new when compared with 02/21/2014. stones identified within the gallbladder. No significant gallbladder wall thickening or bile duct dilatation. Pancreas: Unremarkable. No pancreatic ductal dilatation or surrounding inflammatory changes. Spleen: Normal in size without focal abnormality. Adrenals/Urinary Tract: Adrenal glands are unremarkable. Kidneys are normal, without renal calculi, focal lesion, or hydronephrosis. Bladder is unremarkable. Stomach/Bowel: Stomach appears normal. No small bowel wall thickening, inflammation or distension. The appendix is visualized and is unremarkable. There is mild edema involving the colon from the cecum to the level of the splenic flexure Vascular/Lymphatic: Aortic atherosclerosis. The portal vein appears patent. Esophageal and gastric varices identified. Recanalization of the umbilical vein. No abdominopelvic adenopathy. Reproductive: Prostate is unremarkable. Other: There is a moderate volume of ascites within the abdomen and pelvis. No focal fluid collections identified. Right inguinal hernia contains fat as well as ascites. Musculoskeletal: Subcutaneous edema is identified along both flanks as well as the proximal portions of the lower extremities. No acute or suspicious osseous findings. IMPRESSION: 1. Morphologic features of the liver compatible with cirrhosis. 2. Stigmata of portal venous  hypertension identified including moderate volume of ascites, esophageal and gastric varices and recanalization of the umbilical vein. 3. Heterogeneous appearance of the liver parenchyma with numerous tiny low-density foci scattered throughout which measure up to 7 mm. These are new when compared with 02/21/2014. These are nonspecific and may reflect multiple dysplastic nodules. Consider follow-up imaging with nonemergent MRI of the liver without and with contrast material for further characterization 4. Gallstones. 5. Mild edema involving the colon from the cecum to the level of the splenic flexure. This is nonspecific and may be related to underlying liver disease. 6. Right inguinal hernia contains fat as well as ascites. 7. Subcutaneous edema is identified along both flanks as well as the proximal portions of the lower extremities.  8.  Aortic Atherosclerosis (ICD10-I70.0). Electronically Signed   By: Kerby Moors M.D.   On: 08/02/2022 13:38  [4 week]  Assessment:   57 year old male with with PMH significant for alcoholic liver cirrhosis, ongoing alcohol dependence, sleep apnea, DM, HTN, recent admission for anasarca who presents to ED with vomiting blood/melena for 3 days.  Upper GI bleed: Suspect esophageal variceal bleeding.  Substantial blood loss over 1 week, hemoglobin dropping from 12.9-8.2.  He has received 2 units of packed red blood cells with hemoglobin currently 9.1.  He is hemodynamically stable.  He is receiving octreotide and IV PPI twice daily.  I have started him on IV Rocephin for SBP prophylaxis.  Plan for EGD today.  Decompensated alcoholic cirrhosis: Ongoing daily alcohol use.  Recent admission with anasarca, alcoholic hepatitis.  Discharged on prednisolone.  He did not have follow-up labs to determine response.  Current MELD Na of 27. DF 45.9 using low-end reference range for PT (11.4).  Presented with sodium of 119, acute on chronic hyponatremia.  Today's labs pending.  Currently  with mild lower extremity edema.  Patient reports intermittent drowsiness, mild confusion which may be a sign of a mild hepatic encephalopathy.   Plan:   Anticipate upper endoscopy today.  I have discussed the risks, alternatives, benefits with regards to but not limited to the risk of reaction to medication, bleeding, infection, perforation and the patient is agreeable to proceed. Written consent to be obtained. Keep NPO. Follow-up on pending BMET. Continue IV PPI twice daily, octreotide, Rocephin. Add lactulose, titrate to 2-3 soft stools daily. Complete alcohol cessation.  Patient does not appear motivated. Hold off on prednisolone for now in setting of ongoing etoh use.    LOS: 1 day   We would like to thank you for the opportunity to participate in the care of JAHSEAN CAIL.  Laureen Ochs. Bernarda Caffey [facility] [phone] 2/22/20248:51 AM

## 2022-08-26 NOTE — Anesthesia Procedure Notes (Signed)
Procedure Name: Intubation Date/Time: 08/26/2022 11:24 AM  Performed by: Vista Deck, CRNAPre-anesthesia Checklist: Patient identified, Patient being monitored, Timeout performed, Emergency Drugs available and Suction available Patient Re-evaluated:Patient Re-evaluated prior to induction Oxygen Delivery Method: Circle system utilized Preoxygenation: Pre-oxygenation with 100% oxygen Induction Type: IV induction Ventilation: Mask ventilation without difficulty Laryngoscope Size: Mac and 3 Grade View: Grade I Tube type: Oral Tube size: 7.5 mm Number of attempts: 1 Airway Equipment and Method: Stylet Placement Confirmation: ETT inserted through vocal cords under direct vision, positive ETCO2 and breath sounds checked- equal and bilateral Secured at: 22 cm Tube secured with: Tape Dental Injury: Teeth and Oropharynx as per pre-operative assessment

## 2022-08-26 NOTE — Consult Note (Signed)
Gastroenterology Consult   Referring Provider: No ref. provider found Primary Care Physician:  Celene Squibb, MD Primary Gastroenterologist:  Elon Alas. Abbey Chatters, DO  Patient ID: Shawn Velazquez; [id number]; [birth year]   Admit date: 08/25/2022  LOS: 1 day   Date of Consultation: 08/26/2022  Reason for Consultation:  GI bleed    History of Present Illness   Shawn Velazquez is a 57 y.o. male with PMH significant for alcoholic liver cirrhosis, ongoing alcohol dependence, sleep apnea, DM, HTN presented to ED with vomiting blood for 3 days. Also reports black stools. Chronic brbpr.   Recent admission for decompensated liver disease (anasarca), discharged on 08/05/22.   In ED: Lipase 99, sodium 119, glucose 372, BUN 25, creatinine 1.17, total bilirubin 3.2, alkaline phosphatase 149, AST 126, ALT 74, albumin 2.7, white blood cell count 13,100, hemoglobin 8.2 (down from 12.91-week prior), hematocrit 22.1, MCV 104.7, platelets 97,000, INR 1.6.  Today hemoglobin 9.1 after 2 units of packed red blood cells.  Patient reports vomiting blood for 3 days. Last episode was yesterday morning, bright red. Also passing black stools. Has intermittent brbpr with up to date colonoscopy. Some heartburn. No dysphagia. Intermittent vague abdominal pain. Continues to drink etoh, one 16 ounce can/wine cooler each night to help him sleep. Lower extremity edema is improved. Less abdominal distention. Patient complains of intermittent sluggishness, mild confusion over the past one month. BM most days. Has had some mild tremors.  Patient discharged earlier this month on prednisolone for etoh hepatitis. He did not complete labs as requested on 08/09/22.   Colonoscopy 04/05/2022: -Preparation of the colon was fair. -Non-bleeding internal hemorrhoids. -Two 2 to 3 mm polyps in the cecum, removed with a cold snare. Resected and retrieved. -Three 4 to 8 mm polyps in the transverse colon, removed with a cold snare.  Resected and retrieved. -One 8 mm polyp in the descending colon, removed with a cold snare. Resected and retrieved. -The examination was otherwise normal. -Tubular adenomas -Three year surveillance.     Prior to Admission medications   Medication Sig Start Date End Date Taking? Authorizing Provider  acetaminophen (TYLENOL) 325 MG tablet Take 650 mg by mouth as needed for mild pain, headache or moderate pain.   Yes [provider]  FLUoxetine (PROZAC) 10 MG capsule TAKE 4 CAPSULES BY MOUTH EVERY DAY Patient taking differently: Take 10 mg by mouth See admin instructions. TAKE 2 CAPSULES IN THE MORNING AND 2 CAPSULES EVERY EVENING. 02/17/22  Yes Paseda, Dewaine Conger, FNP  furosemide (LASIX) 40 MG tablet Take 1 tablet (40 mg total) by mouth daily. 08/06/22 08/06/23 Yes Johnson, Clanford L, MD  insulin glargine (LANTUS SOLOSTAR) 100 UNIT/ML Solostar Pen Inject 40 Units into the skin at bedtime. 08/05/22  Yes Johnson, Clanford L, MD  insulin lispro (HUMALOG KWIKPEN) 100 UNIT/ML KwikPen Inject 8-14 Units into the skin 3 (three) times daily. Patient taking differently: Inject 8-14 Units into the skin 3 (three) times daily. PER SLIDING SCALE 05/12/22  Yes Brita Romp, NP  meclizine (ANTIVERT) 12.5 MG tablet Take 12.5 mg by mouth 3 (three) times daily as needed for dizziness or nausea. 08/20/22  Yes [provider]  metFORMIN (GLUCOPHAGE-XR) 500 MG 24 hr tablet Take 2 tablets (1,000 mg total) by mouth 2 (two) times daily with a meal. 08/05/22  Yes Johnson, Clanford L, MD  Multiple Vitamin (MULTIVITAMIN) tablet Take 1 tablet by mouth daily.   Yes [provider]  omeprazole (PRILOSEC OTC) 20 MG tablet  Take 20 mg by mouth every other day.   Yes [provider]  ondansetron (ZOFRAN-ODT) 4 MG disintegrating tablet Take 4 mg by mouth 2 (two) times daily. FOR 14 DAYS START ON 08/20/22 08/20/22  Yes [provider]  OVER THE COUNTER MEDICATION Take 1 tablet by mouth  daily. Melatonin   Yes [provider]  prednisoLONE (PRELONE) 15 MG/5ML SOLN Take 13.3 mLs (40 mg total) by mouth daily before breakfast for 25 days, THEN 8.3 mLs (25 mg total) daily before breakfast for 7 days, THEN 5 mLs (15 mg total) daily before breakfast for 7 days, THEN 3 mLs (9 mg total) daily before breakfast for 5 days. 08/06/22 09/19/22 Yes Johnson, Clanford L, MD  spironolactone (ALDACTONE) 100 MG tablet Take 1 tablet (100 mg total) by mouth daily. 08/06/22  Yes Johnson, Clanford L, MD  thiamine (VITAMIN B-1) 100 MG tablet Take 1 tablet (100 mg total) by mouth daily. 08/06/22  Yes Johnson, Clanford L, MD  albuterol (VENTOLIN HFA) 108 (90 Base) MCG/ACT inhaler INHALE 2 PUFFS INTO THE LUNGS EVERY 6 HOURS AS NEEDED FOR WHEEZING OR SHORTNESS OF BREATH Patient not taking: Reported on 08/25/2022 05/18/21   Lindell Spar, MD  BELSOMRA 15 MG TABS Take 1 tablet by mouth daily. Patient not taking: Reported on 08/25/2022 07/22/22   [provider]  Continuous Blood Gluc Sensor (DEXCOM G7 SENSOR) MISC Inject 1 application. into the skin as directed. Change sensor every 10 days as directed. 12/09/21   Brita Romp, NP  glucose blood test strip Use as instructed to monitor glucose 4 times daily 12/09/21   Brita Romp, NP  Insulin Pen Needle (PEN NEEDLES) 32G X 4 MM MISC 1 each by Does not apply route in the morning, at noon, in the evening, and at bedtime. Use to inject insulin 4 times daily 05/12/22   Brita Romp, NP  montelukast (SINGULAIR) 10 MG tablet TAKE 1 TABLET(10 MG) BY MOUTH AT BEDTIME Patient not taking: Reported on 08/25/2022 12/10/21   Renee Rival, FNP    Current Facility-Administered Medications  Medication Dose Route Frequency Provider Last Rate Last Admin   cefTRIAXone (ROCEPHIN) 2 g in sodium chloride 0.9 % 100 mL IVPB  2 g Intravenous Q24H Mahala Menghini, PA-C       Chlorhexidine Gluconate Cloth 2 % PADS 6 each  6 each Topical Daily Emokpae, Ejiroghene  E, MD   6 each at 08/26/22 0405   dextrose 5 %-0.9 % sodium chloride infusion   Intravenous Continuous Heath Lark D, DO 75 mL/hr at 08/26/22 0846 New Bag at [id number]   folic acid (FOLVITE) tablet 1 mg  1 mg Oral Daily Emokpae, Ejiroghene E, MD       insulin aspart (novoLOG) injection 0-15 Units  0-15 Units Subcutaneous Q4H Emokpae, Ejiroghene E, MD   5 Units at 08/25/22 2351   LORazepam (ATIVAN) tablet 1-4 mg  1-4 mg Oral Q1H PRN Emokpae, Ejiroghene E, MD       Or   LORazepam (ATIVAN) injection 1-4 mg  1-4 mg Intravenous Q1H PRN Emokpae, Ejiroghene E, MD   2 mg at 08/25/22 2351   multivitamin with minerals tablet 1 tablet  1 tablet Oral Daily Emokpae, Ejiroghene E, MD       octreotide (SANDOSTATIN) 500 mcg in sodium chloride 0.9 % 250 mL (2 mcg/mL) infusion  50 mcg/hr Intravenous Continuous Emokpae, Ejiroghene E, MD 25 mL/hr at 08/26/22 0634 50 mcg/hr at 08/26/22 [id number]   [  START ON 08/29/2022] pantoprazole (PROTONIX) injection 40 mg  40 mg Intravenous Q12H Emokpae, Ejiroghene E, MD       pantoprozole (PROTONIX) 80 mg /NS 100 mL infusion  8 mg/hr Intravenous Continuous Emokpae, Ejiroghene E, MD 10 mL/hr at 08/26/22 0634 8 mg/hr at 08/26/22 [id number]   polyethylene glycol (MIRALAX / GLYCOLAX) packet 17 g  17 g Oral Daily PRN Emokpae, Ejiroghene E, MD       thiamine (VITAMIN B1) tablet 100 mg  100 mg Oral Daily Emokpae, Ejiroghene E, MD       Or   thiamine (VITAMIN B1) injection 100 mg  100 mg Intravenous Daily Emokpae, Ejiroghene E, MD        Allergies as of 08/25/2022 - Review Complete 08/25/2022  Allergen Reaction Noted   Neosporin + pain relief max st [neomy-bacit-polymyx-pramoxine] Rash 04/19/2012    Past Medical History:  Diagnosis Date   Anxiety    Arthritis    affects hands, shoulder, neck, knees, hips, ankles, toes   Chronic back pain    Diabetes mellitus    diet controlled   Eczema    Fatty liver, alcoholic    GERD (gastroesophageal reflux disease)    Glaucoma    slight case    Hypertension    Psoriasis    Sleep apnea    Substance abuse ([place])    Vertigo 09/11/2017   Wears glasses     Past Surgical History:  Procedure Laterality Date   COLONOSCOPY WITH PROPOFOL N/A 04/05/2022   Procedure: COLONOSCOPY WITH PROPOFOL;  Surgeon: [name], DO;  Location: [facility];  Service: Endoscopy;  Laterality: N/A;  10:45 AM,unable to reach pt to move up   [place]     POLYPECTOMY  04/05/2022   Procedure: POLYPECTOMY;  Surgeon: [name], DO;  Location: [facility];  Service: Endoscopy;;   POSTERIOR CERVICAL LAMINECTOMY  04/21/2012   Procedure: POSTERIOR CERVICAL LAMINECTOMY;  Surgeon: Elaina Hoops, MD;  Location: [facility];  Service: Neurosurgery;  Laterality: Left;  Posterior Cervical laminectomy/foraminotomy and diskectomy, left cervical seven-thoracic one   ROTATOR CUFF REPAIR     THORACIC DISCECTOMY Left 04/03/2014   Procedure: Left Thoracic Seven to Eight, Thoracic Eight to Nine Thoracic Discectomy ;  Surgeon: Elaina Hoops, MD;  Location: [place] [facility];  Service: Neurosurgery;  Laterality: Left;    Family History  Problem Relation Age of Onset   Diabetes Father    Cancer - Lung Father    Hypertension Other    Cancer - Lung Other     Social History   Socioeconomic History   Marital status: Divorced    Spouse name: Not on file   Number of children: Not on file   Years of education: Not on file   Highest education level: Not on file  Occupational History    Comment: Truck to storage  Tobacco Use   Smoking status: Every Day    Packs/day: 0.25    Years: 40.00    Total pack years: 10.00    Types: Cigarettes    Last attempt to quit: 05/09/2022    Years since quitting: 0.2   Smokeless tobacco: Never   Tobacco comments:    pt trying to quit on his own  Vaping Use   Vaping Use: Former  Substance and Sexual Activity   Alcohol use: Yes    Alcohol/week: 12.0 standard drinks of alcohol    Types: 12 Cans of beer per week  Comment: Either Smirnoff Smash-4 per day or Fireball-10 airplane bottles in a day   Drug use: No   Sexual activity: Yes    Birth control/protection: None  Other Topics Concern   Not on file  Social History Narrative   Not on file   Social Determinants of Health   Financial Resource Strain: Not on file  Food Insecurity: No Food Insecurity (08/26/2022)   Hunger Vital Sign    Worried About Running Out of Food in the Last Year: Never true    Ran Out of Food in the Last Year: Never true  Transportation Needs: No Transportation Needs (08/26/2022)   PRAPARE - Transportation    Lack of Transportation (Medical): No    Lack of Transportation (Non-Medical): No  Physical Activity: Not on file  Stress: Not on file  Social Connections: Not on file  Intimate Partner Violence: Not At Risk (08/26/2022)   Humiliation, Afraid, Rape, and Kick questionnaire    Fear of Current or Ex-Partner: No    Emotionally Abused: No    Physically Abused: No    Sexually Abused: No     Review of System:   General: Negative for anorexia, weight loss, fever, chills, fatigue, +weakness. Eyes: Negative for vision changes.  ENT: Negative for hoarseness, difficulty swallowing , nasal congestion. CV: Negative for chest pain, angina, palpitations, dyspnea on exertion, peripheral edema.  Respiratory: Negative for dyspnea at rest, dyspnea on exertion, cough, sputum, wheezing.  GI: See history of present illness. GU:  Negative for dysuria, hematuria, urinary incontinence, urinary frequency, nocturnal urination.  MS: Negative for joint pain, low back pain.  Derm: Negative for rash or itching.  Neuro: Negative for weakness, abnormal sensation, seizure, frequent headaches, memory loss, +confusion.  Psych: Negative for anxiety, depression, suicidal ideation, hallucinations.  Endo: Negative for unusual weight change.  Heme: Negative for bruising or bleeding. Allergy: Negative for rash or hives.      Physical  Examination:   Vital signs in last 24 hours: Temp:  [97.6 F (36.4 C)-99 F (37.2 C)] 98.6 F (37 C) (02/22 0741) Pulse Rate:  [80-98] 80 (02/22 0600) Resp:  [12-27] 27 (02/22 0600) BP: (93-113)/(60-79) 112/72 (02/22 0600) SpO2:  [78 %-100 %] 100 % (02/22 0600) Weight:  [80.3 kg] 80.3 kg (02/21 1534) Last BM Date : 08/25/22  General: chronically ill appearing, male, alert and cooperative.   Head: Normocephalic, atraumatic.   Eyes: Conjunctiva pale, mild icterus. Mouth: Oropharyngeal mucosa moist and pink , no lesions erythema or exudate. Neck: Supple without thyromegaly, masses, or lymphadenopathy.  Lungs: Clear to auscultation bilaterally.  Heart: Regular rate and rhythm, no murmurs rubs or gallops.  Abdomen: Bowel sounds are normal, nontender, nondistended, no hepatosplenomegaly or masses, no abdominal bruits or hernia , no rebound or guarding.   Rectal: not performed Extremities: 1-2+ pedal edema bilaterally. No clubbing, deformity.  Neuro: Alert and oriented x 4 , grossly normal neurologically.  Skin: Warm and dry, no rash or jaundice.   Psych: Alert and cooperative, normal mood and affect.        Intake/Output from previous day: 02/21 0701 - 02/22 0700 In: 1009.3 [I.V.:370.3; Blood:639] Out: 400 [Urine:400] Intake/Output this shift: No intake/output data recorded.  Lab Results:   CBC Recent Labs    08/25/22 1558 08/26/22 0116 08/26/22 0608  WBC 13.1*  --  13.4*  HGB 8.2* 8.0* 9.1*  HCT 22.1* 21.4* 23.8*  MCV 104.7*  --  96.7  PLT 97*  --  82*   BMET Recent  Labs    08/25/22 1558  NA 119*  K 5.0  CL 81*  CO2 27  GLUCOSE 372*  BUN 25*  CREATININE 1.17  CALCIUM 7.9*   LFT Recent Labs    08/25/22 1558  BILITOT 3.2*  ALKPHOS 149*  AST 126*  ALT 74*  PROT 5.8*  ALBUMIN 2.7*    Lipase Recent Labs    08/25/22 1558  LIPASE 99*    PT/INR Recent Labs    08/25/22 1558  LABPROT 18.5*  INR 1.6*     Hepatitis Panel No results for  input(s): "HEPBSAG", "HCVAB", "HEPAIGM", "HEPBIGM" in the last 72 hours.   Imaging Studies:   MR LIVER W WO CONTRAST  Result Date: 08/03/2022 CLINICAL DATA:  Cirrhosis.  [place]. EXAM: MRI ABDOMEN WITHOUT AND WITH CONTRAST TECHNIQUE: Multiplanar multisequence MR imaging of the abdomen was performed both before and after the administration of intravenous contrast. CONTRAST:  36m GADAVIST GADOBUTROL 1 MMOL/ML IV SOLN COMPARISON:  CT 08/02/2022 and older. FINDINGS: Lower chest: Trace pleural fluid. Breathing motion along several series. Hepatobiliary: There is a micronodular liver. Overall liver is slightly enlarged. The liver is without areas of restricted diffusion. No hypervascular lesions with washout. The tiny areas of low-density on the prior CT scan are again seen as small subcentimeter foci which are dark on T2 foci which do not clearly show abnormal enhancement and may be multiple regenerative nodules. Main portal vein has a diameter of 13 mm of the liver hilum. No biliary ductal dilatation. Gallbladder is nondilated. Gallstones Pancreas: Mild pancreatic atrophy. No pancreatic duct dilatation. No enhancing pancreatic mass. Spleen: Spleen has preserved enhancement. Spleen is enlarged at 14.4 cm in AP length. Adrenals/Urinary Tract: Adrenal glands are preserved. No enhancing renal mass or collecting system dilatation. Stomach/Bowel: Stomach is nondilated. Mild gastric fold thickening. The visualized bowel is nondilated. Normal retrocecal appendix. Vascular/Lymphatic: Normal caliber aorta and IVC. There are scattered varices identified including along the lower esophagus, upper stomach. There are several prominent lymph nodes identified in the upper abdomen including periceliac. Example series 14, image 42 measuring 2.3 x 0.8 cm. Portacaval node series 14, image 43 measuring 20 by 10 mm. These are nonspecific. Other:  Anasarca.  Scattered mesenteric haziness and ascites. Musculoskeletal: Focal L3 spinal  hemangioma. IMPRESSION: Evidence of chronic liver disease. Micronodular liver with multiple presumed regenerative nodules. No clear aggressive appearing lesions with restricted diffusion, hypervascularity or washout at this time. Mild ascites. Varices including along the stomach and lower esophagus. Mild splenic enlargement. The portal vein is patent. Nonspecific upper abdominal nodes. Electronically Signed   By: AJill SideM.D.   On: 08/03/2022 18:40   ECHOCARDIOGRAM COMPLETE  Result Date: 08/03/2022    ECHOCARDIOGRAM REPORT   Patient Name:   SCLAXTON LYUDate of Exam: 08/03/2022 Medical Rec #:  [id number]        Height:       66.0 in Accession #:    [id number]       Weight:       199.3 lb Date of Birth:  1Nov 15, 1967       BSA:          1.997 m Patient Age:    89years          BP:           137/77 mmHg Patient Gender: M                 HR:  74 bpm. Exam Location:  [name] Procedure: 2D Echo, 3D Echo, Cardiac Doppler, Color Doppler and Strain Analysis Indications:    Dyspnea R06.00  History:        Patient has no prior history of Echocardiogram examinations.                 Signs/Symptoms:Dyspnea and Edema; Risk Factors:Hypertension,                 Diabetes, Dyslipidemia, Former Smoker and Sleep Apnea.  Sonographer:    Greer Pickerel Referring Phys: [id number] DAVID TAT  Sonographer Comments: Image acquisition challenging due to patient body habitus and Image acquisition challenging due to respiratory motion. Global longitudinal strain was attempted. IMPRESSIONS  1. Left ventricular ejection fraction, by estimation, is 60 to 65%. The left ventricle has normal function. The left ventricle has no regional wall motion abnormalities. There is mild left ventricular hypertrophy. Left ventricular diastolic parameters were normal. The average left ventricular global longitudinal strain is -21.1 %. The global longitudinal strain is normal.  2. Right ventricular systolic function was not well visualized. The  right ventricular size is normal.  3. Left atrial size was moderately dilated.  4. The mitral valve is grossly normal. No evidence of mitral valve regurgitation. No evidence of mitral stenosis.  5. The aortic valve was not well visualized. There is moderate calcification of the aortic valve. Aortic valve regurgitation is not visualized. No aortic stenosis is present. Comparison(s): No prior Echocardiogram. FINDINGS  Left Ventricle: Left ventricular ejection fraction, by estimation, is 60 to 65%. The left ventricle has normal function. The left ventricle has no regional wall motion abnormalities. The average left ventricular global longitudinal strain is -21.1 %. The global longitudinal strain is normal. The left ventricular internal cavity size was normal in size. There is mild left ventricular hypertrophy. Left ventricular diastolic parameters were normal. Right Ventricle: The right ventricular size is normal. No increase in right ventricular wall thickness. Right ventricular systolic function was not well visualized. Left Atrium: Left atrial size was moderately dilated. Right Atrium: Right atrial size was normal in size. Pericardium: There is no evidence of pericardial effusion. Mitral Valve: The mitral valve is grossly normal. No evidence of mitral valve regurgitation. No evidence of mitral valve stenosis. Tricuspid Valve: The tricuspid valve is grossly normal. Tricuspid valve regurgitation is not demonstrated. No evidence of tricuspid stenosis. Aortic Valve: The aortic valve was not well visualized. There is moderate calcification of the aortic valve. Aortic valve regurgitation is not visualized. No aortic stenosis is present. Pulmonic Valve: The pulmonic valve was not well visualized. Pulmonic valve regurgitation is not visualized. No evidence of pulmonic stenosis. Aorta: The aortic root and ascending aorta are structurally normal, with no evidence of dilitation. Venous: The inferior vena cava was not well  visualized. IAS/Shunts: The interatrial septum was not well visualized.  LEFT VENTRICLE PLAX 2D LVIDd:         4.60 cm   Diastology LVIDs:         2.90 cm   LV e' medial:    7.77 cm/s LV PW:         1.20 cm   LV E/e' medial:  12.5 LV IVS:        0.90 cm   LV e' lateral:   11.70 cm/s LVOT diam:     2.10 cm   LV E/e' lateral: 8.3 LV SV:         70 LV SV Index:   35  2D Longitudinal Strain LVOT Area:     3.46 cm  2D Strain GLS Avg:     -21.1 %                           3D Volume EF:                          3D EF:        56 %                          LV EDV:       221 ml                          LV ESV:       97 ml                          LV SV:        124 ml RIGHT VENTRICLE RV S prime:     13.50 cm/s TAPSE (M-mode): 1.8 cm LEFT ATRIUM             Index        RIGHT ATRIUM           Index LA diam:        3.80 cm 1.90 cm/m   RA Area:     12.50 cm LA Vol (A2C):   74.4 ml 37.26 ml/m  RA Volume:   21.10 ml  10.57 ml/m LA Vol (A4C):   78.7 ml 39.41 ml/m LA Biplane Vol: 81.5 ml 40.82 ml/m  AORTIC VALVE LVOT Vmax:   106.00 cm/s LVOT Vmean:  68.400 cm/s LVOT VTI:    0.203 m  AORTA Ao Root diam: 3.40 cm Ao Asc diam:  3.00 cm MITRAL VALVE               TRICUSPID VALVE MV Area (PHT): 4.31 cm    TR Peak grad:   4.7 mmHg MV Decel Time: 176 msec    TR Vmax:        108.00 cm/s MV E velocity: 97.40 cm/s MV A velocity: 81.50 cm/s  SHUNTS MV E/A ratio:  1.20        Systemic VTI:  0.20 m                            Systemic Diam: 2.10 cm Vishnu Priya Mallipeddi Electronically signed by Lorelee Cover Mallipeddi Signature Date/Time: 08/03/2022/1:10:55 PM    Final    US Paracentesis  Result Date: 08/02/2022 INDICATION: Ascites EXAM: ULTRASOUND GUIDED DIAGNOSTIC AND THERAPEUTIC PARACENTESIS MEDICATIONS: None. COMPLICATIONS: None immediate. PROCEDURE: Informed written consent was obtained from the patient after a discussion of the risks, benefits and alternatives to treatment. A timeout was performed prior to the initiation of  the procedure. Initial ultrasound scanning demonstrates a large amount of ascites within the right lower abdominal quadrant. The right lower abdomen was prepped and draped in the usual sterile fashion. 1% lidocaine was used for local anesthesia. Following this, a 19 gauge, 7-cm, Yueh catheter was introduced. An ultrasound image was saved for documentation purposes. The paracentesis was performed. The catheter was removed and a dressing was applied. The patient tolerated the procedure well without immediate post procedural complication. Patient received post-procedure intravenous  albumin; see nursing notes for details. FINDINGS: A total of approximately 3.5 L of yellow ascitic fluid was removed. Samples were sent to the laboratory as requested by the clinical team. IMPRESSION: Successful ultrasound-guided paracentesis yielding 3.5 liters of peritoneal fluid. Electronically Signed   By: Lavonia Dana M.D.   On: 08/02/2022 16:52   DG Chest Portable 1 View  Result Date: 08/02/2022 CLINICAL DATA:  Shortness of breath EXAM: PORTABLE CHEST 1 VIEW COMPARISON:  09/11/2017 FINDINGS: The heart size and mediastinal contours are within normal limits. Mild diffuse interstitial opacity. The visualized skeletal structures are unremarkable. IMPRESSION: Mild diffuse bilateral interstitial pulmonary opacity, suggesting edema or atypical/viral infection. No focal airspace opacity. Electronically Signed   By: Delanna Ahmadi M.D.   On: 08/02/2022 14:27   CT ABDOMEN PELVIS W CONTRAST  Result Date: 08/02/2022 CLINICAL DATA:  Abdominal pain, weakness, leg swelling and abdominal swelling for 1 week. EXAM: CT ABDOMEN AND PELVIS WITH CONTRAST TECHNIQUE: Multidetector CT imaging of the abdomen and pelvis was performed using the standard protocol following bolus administration of intravenous contrast. RADIATION DOSE REDUCTION: This exam was performed according to the departmental dose-optimization program which includes automated exposure  control, adjustment of the mA and/or kV according to patient size and/or use of iterative reconstruction technique. CONTRAST:  130m OMNIPAQUE IOHEXOL 300 MG/ML  SOLN COMPARISON:  02/21/2014 FINDINGS: Lower chest: No acute abnormality. No pleural fluid or airspace consolidation. Hepatobiliary: There is hypertrophy of the lateral segment of left hepatic lobe and caudate lobe of liver. The contour the liver appears irregular and nodular. Heterogeneous appearance of the liver parenchyma with numerous tiny low-density foci are scattered throughout which measure up to 7 mm. These are new when compared with 02/21/2014. stones identified within the gallbladder. No significant gallbladder wall thickening or bile duct dilatation. Pancreas: Unremarkable. No pancreatic ductal dilatation or surrounding inflammatory changes. Spleen: Normal in size without focal abnormality. Adrenals/Urinary Tract: Adrenal glands are unremarkable. Kidneys are normal, without renal calculi, focal lesion, or hydronephrosis. Bladder is unremarkable. Stomach/Bowel: Stomach appears normal. No small bowel wall thickening, inflammation or distension. The appendix is visualized and is unremarkable. There is mild edema involving the colon from the cecum to the level of the splenic flexure Vascular/Lymphatic: Aortic atherosclerosis. The portal vein appears patent. Esophageal and gastric varices identified. Recanalization of the umbilical vein. No abdominopelvic adenopathy. Reproductive: Prostate is unremarkable. Other: There is a moderate volume of ascites within the abdomen and pelvis. No focal fluid collections identified. Right inguinal hernia contains fat as well as ascites. Musculoskeletal: Subcutaneous edema is identified along both flanks as well as the proximal portions of the lower extremities. No acute or suspicious osseous findings. IMPRESSION: 1. Morphologic features of the liver compatible with cirrhosis. 2. Stigmata of portal venous  hypertension identified including moderate volume of ascites, esophageal and gastric varices and recanalization of the umbilical vein. 3. Heterogeneous appearance of the liver parenchyma with numerous tiny low-density foci scattered throughout which measure up to 7 mm. These are new when compared with 02/21/2014. These are nonspecific and may reflect multiple dysplastic nodules. Consider follow-up imaging with nonemergent MRI of the liver without and with contrast material for further characterization 4. Gallstones. 5. Mild edema involving the colon from the cecum to the level of the splenic flexure. This is nonspecific and may be related to underlying liver disease. 6. Right inguinal hernia contains fat as well as ascites. 7. Subcutaneous edema is identified along both flanks as well as the proximal portions of the lower extremities.  8.  Aortic Atherosclerosis (ICD10-I70.0). Electronically Signed   By: Kerby Moors M.D.   On: 08/02/2022 13:38  [4 week]  Assessment:   57 year old male with with PMH significant for alcoholic liver cirrhosis, ongoing alcohol dependence, sleep apnea, DM, HTN, recent admission for anasarca who presents to ED with vomiting blood/melena for 3 days.  Upper GI bleed: Suspect esophageal variceal bleeding.  Substantial blood loss over 1 week, hemoglobin dropping from 12.9-8.2.  He has received 2 units of packed red blood cells with hemoglobin currently 9.1.  He is hemodynamically stable.  He is receiving octreotide and IV PPI twice daily.  I have started him on IV Rocephin for SBP prophylaxis.  Plan for EGD today.  Decompensated alcoholic cirrhosis: Ongoing daily alcohol use.  Recent admission with anasarca, alcoholic hepatitis.  Discharged on prednisolone.  He did not have follow-up labs to determine response.  Current MELD Na of 27. DF 45.9 using low-end reference range for PT (11.4).  Presented with sodium of 119, acute on chronic hyponatremia.  Today's labs pending.  Currently  with mild lower extremity edema.  Patient reports intermittent drowsiness, mild confusion which may be a sign of a mild hepatic encephalopathy.   Plan:   Anticipate upper endoscopy today.  I have discussed the risks, alternatives, benefits with regards to but not limited to the risk of reaction to medication, bleeding, infection, perforation and the patient is agreeable to proceed. Written consent to be obtained. Keep NPO. Follow-up on pending BMET. Continue IV PPI twice daily, octreotide, Rocephin. Add lactulose, titrate to 2-3 soft stools daily. Complete alcohol cessation.  Patient does not appear motivated. Hold off on prednisolone for now in setting of ongoing etoh use.    LOS: 1 day   We would like to thank you for the opportunity to participate in the care of Shawn Velazquez.  Laureen Ochs. Bernarda Caffey [facility] [phone] 2/22/20248:51 AM

## 2022-08-26 NOTE — Progress Notes (Signed)
PROGRESS NOTE    Shawn Velazquez  [id number] DOB: [birth year] DOA: 08/25/2022 PCP: Celene Squibb, MD   Brief Narrative:    Shawn Velazquez is a 57 y.o. male with medical history significant for  alcoholic liver cirrhosis, diabetes mellitus, hypertension, anxiety. Patient presented to the ED with complaints of vomiting blood, he reports for the past 3 days, he has been vomiting a large amount of blood daily.  Patient was admitted with acute blood loss anemia in the setting of suspected upper GI bleed.  He has got undergone EGD with variceal banding and no overt bleeding identified.  Plan is to remain on IV PPI and octreotide for 72 hours and clear liquid diet for now.  Assessment & Plan:   Principal Problem:   Upper GI bleed Active Problems:   Hyponatremia   Alcoholic cirrhosis of liver with ascites (HCC)   Prolonged QT interval   Hypertension, essential   Anxiety and depression   Elevated LFTs   Alcohol abuse   DM (diabetes mellitus) (HCC)   Symptomatic anemia   ABLA (acute blood loss anemia)  Assessment and Plan:  Acute blood loss anemia secondary to upper GI bleed Hematemesis, hematochezia, melena.  Hemoglobin 8.2, down from baseline 11-13.  With dizziness.  History of liver cirrhosis, last 61/29/24 and MRI liver showing esophageal and gastric varices. -Status post EGD with variceal banding 2/22 -Clear liquid diet -Continue IV PPI and octreotide drips for 72 total hours and then plan for Carafate initiation afterwards with dietary advancement -Continue IV Rocephin while inpatient for total antibiotics x 7 days     Acute on chronic hyponatremia Continue to hold IV Lasix, started on clear liquid diet   Prolonged QT interval QTc 521.  Not new, present on recent EKG. Potassium 5.  Magnesium 1.8. -Hold fluoxetine   Alcoholic cirrhosis of liver with ascites (HCC) Elevated liver enzymes, thrombocytopenia platelet 97 (slight fluctuating 97-146), INR 1.6.  Ongoing  alcohol abuse.  Last drink was last night.  Drinks wine daily.  No sign of withdrawal at this time - CIWA PRN - Thiamine, folate, multivitamins -Check Phos -Hemoglobin, platelets   DM (diabetes mellitus) (HCC) - HgbA1c- 5.2 - SSI- M -Hold home Lantus 40 units daily, Hold metformin - NPO   Elevated LFTs Stable and chronically elevated AST 126, ALT 74, ALP 149.  Total bili 3.2.   Anxiety and depression Hold Prozac for now while n.p.o. and with prolonged QT   Hypertension, essential Blood pressure soft, -Hold Lasix, spironolactone for now in the setting of GI bleed  DVT prophylaxis:SCDs Code Status: Full Family Communication: GI discussed with family 2/22 Disposition Plan:  Status is: Inpatient Remains inpatient appropriate because: Need for IV medications  Consultants:  GI  Procedures:  EGD with variceal banding 2/22  Antimicrobials:  Anti-infectives (From admission, onward)    Start     Dose/Rate Route Frequency Ordered Stop   08/26/22 1800  [MAR Hold]  cefTRIAXone (ROCEPHIN) 2 g in sodium chloride 0.9 % 100 mL IVPB        (MAR Hold since Thu 08/26/2022 at 1044.Hold Reason: Transfer to a Procedural area)   2 g 200 mL/hr over 30 Minutes Intravenous Every 24 hours 08/26/22 0818     08/25/22 1715  cefTRIAXone (ROCEPHIN) 2 g in sodium chloride 0.9 % 100 mL IVPB        2 g 200 mL/hr over 30 Minutes Intravenous  Once 08/25/22 1700 08/25/22 1830       Subjective:  Patient seen and evaluated today with no new acute complaints or concerns. No acute concerns or events noted overnight.  Objective: Vitals:   08/26/22 1048 08/26/22 1153 08/26/22 1200 08/26/22 1215  BP: 111/76 134/86 134/83 132/84  Pulse: 80 90 89 86  Resp: 15 16 20 16  $ Temp: 98 F (36.7 C) 98.2 F (36.8 C)    TempSrc: Oral     SpO2: 97% 99% 99% 100%  Weight:      Height:        Intake/Output Summary (Last 24 hours) at 08/26/2022 1225 Last data filed at 08/26/2022 1138 Gross per 24 hour  Intake  1509.26 ml  Output 400 ml  Net 1109.26 ml   Filed Weights   08/25/22 1534  Weight: 80.3 kg    Examination:  General exam: Appears calm and comfortable  Respiratory system: Clear to auscultation. Respiratory effort normal. Cardiovascular system: S1 & S2 heard, RRR.  Gastrointestinal system: Abdomen is soft Central nervous system: Alert and awake Extremities: No edema Skin: No significant lesions noted Psychiatry: Flat affect.    Data Reviewed: I have personally reviewed following labs and imaging studies  CBC: Recent Labs  Lab 08/25/22 1558 08/26/22 0116 08/26/22 0608  WBC 13.1*  --  13.4*  HGB 8.2* 8.0* 9.1*  HCT 22.1* 21.4* 23.8*  MCV 104.7*  --  96.7  PLT 97*  --  82*   Basic Metabolic Panel: Recent Labs  Lab 08/25/22 1558 08/26/22 0810  NA 119* 129*  K 5.0 3.6  CL 81* 92*  CO2 27 28  GLUCOSE 372* 75  BUN 25* 24*  CREATININE 1.17 1.00  CALCIUM 7.9* 7.7*  MG 1.8 2.0   GFR: Estimated Creatinine Clearance: 80.2 mL/min (by C-G formula based on SCr of 1 mg/dL). Liver Function Tests: Recent Labs  Lab 08/25/22 1558 08/26/22 0810  AST 126* 97*  ALT 74* 61*  ALKPHOS 149* 116  BILITOT 3.2* 4.0*  PROT 5.8* 5.0*  ALBUMIN 2.7* 2.3*   Recent Labs  Lab 08/25/22 1558  LIPASE 99*   No results for input(s): "AMMONIA" in the last 168 hours. Coagulation Profile: Recent Labs  Lab 08/25/22 1558  INR 1.6*   Cardiac Enzymes: No results for input(s): "CKTOTAL", "CKMB", "CKMBINDEX", "TROPONINI" in the last 168 hours. BNP (last 3 results) No results for input(s): "PROBNP" in the last 8760 hours. HbA1C: No results for input(s): "HGBA1C" in the last 72 hours. CBG: Recent Labs  Lab 08/26/22 0251 08/26/22 0413 08/26/22 0732 08/26/22 1048 08/26/22 1202  GLUCAP 125* 89 75 98 121*   Lipid Profile: No results for input(s): "CHOL", "HDL", "LDLCALC", "TRIG", "CHOLHDL", "LDLDIRECT" in the last 72 hours. Thyroid Function Tests: No results for input(s):  "TSH", "T4TOTAL", "FREET4", "T3FREE", "THYROIDAB" in the last 72 hours. Anemia Panel: No results for input(s): "VITAMINB12", "FOLATE", "FERRITIN", "TIBC", "IRON", "RETICCTPCT" in the last 72 hours. Sepsis Labs: No results for input(s): "PROCALCITON", "LATICACIDVEN" in the last 168 hours.  No results found for this or any previous visit (from the past 240 hour(s)).       Radiology Studies: No results found.      Scheduled Meds:  [MAR Hold] Chlorhexidine Gluconate Cloth  6 each Topical Daily   [MAR Hold] folic acid  1 mg Oral Daily   [MAR Hold] insulin aspart  0-15 Units Subcutaneous Q4H   [MAR Hold] lactulose  20 g Oral BID   [MAR Hold] multivitamin with minerals  1 tablet Oral Daily   [MAR Hold] pantoprazole  40  mg Intravenous Q12H   [MAR Hold] thiamine  100 mg Oral Daily   Or   [MAR Hold] thiamine  100 mg Intravenous Daily   Continuous Infusions:  sodium chloride     [MAR Hold] cefTRIAXone (ROCEPHIN)  IV     dextrose 5 % and 0.9% NaCl Stopped (08/26/22 1055)   lactated ringers 50 mL/hr at 08/26/22 1058   octreotide (SANDOSTATIN) 500 mcg in sodium chloride 0.9 % 250 mL (2 mcg/mL) infusion 50 mcg/hr (08/26/22 1117)   pantoprazole 8 mg/hr (08/26/22 1117)     LOS: 1 day    Time spent: 35 minutes    Shatoya Roets Darleen Crocker, DO [facility]  If 7PM-7AM, please contact night-coverage [email or web address] 08/26/2022, 12:25 PM

## 2022-08-26 NOTE — Transfer of Care (Signed)
Immediate Anesthesia Transfer of Care Note  Patient: Shawn Velazquez  Procedure(s) Performed: ESOPHAGOGASTRODUODENOSCOPY (EGD) WITH PROPOFOL ESOPHAGEAL BANDING  Patient Location: PACU  Anesthesia Type:General  Level of Consciousness: sedated and patient cooperative  Airway & Oxygen Therapy: Patient Spontanous Breathing and Patient connected to nasal cannula oxygen  Post-op Assessment: Post -op Vital signs reviewed and stable and Post -op Vital signs reviewed and unstable, Anesthesiologist notified  Post vital signs: Reviewed and stable  Last Vitals:  Vitals Value Taken Time  BP 134/86 08/26/22 1153  Temp 98.2 1155  Pulse 88 08/26/22 1156  Resp 16 08/26/22 1156  SpO2 100 % 08/26/22 1156  Vitals shown include unvalidated device data.  Last Pain:  Vitals:   08/26/22 1131  TempSrc:   PainSc: 0-No pain      Patients Stated Pain Goal: 5 ([id number])  Complications: No notable events documented.

## 2022-08-26 NOTE — Anesthesia Preprocedure Evaluation (Signed)
Anesthesia Evaluation  Patient identified by MRN, date of birth, ID band Patient awake    Reviewed: Allergy & Precautions, H&P , NPO status , Patient's Chart, lab work & pertinent test results  Airway Mallampati: II  TM Distance: >3 FB Neck ROM: Full    Dental  (+) Edentulous Upper, Edentulous Lower   Pulmonary sleep apnea and Continuous Positive Airway Pressure Ventilation , Current Smoker and Patient abstained from smoking.   Pulmonary exam normal breath sounds clear to auscultation       Cardiovascular Exercise Tolerance: Good hypertension, Pt. on medications Normal cardiovascular exam Rhythm:Regular Rate:Normal     Neuro/Psych  PSYCHIATRIC DISORDERS Anxiety Depression    negative neurological ROS     GI/Hepatic ,GERD  Medicated and Controlled,,(+) Cirrhosis   Esophageal Varices      Endo/Other  diabetes, Well Controlled, Type 2, Insulin Dependent    Renal/GU negative Renal ROS  negative genitourinary   Musculoskeletal  (+) Arthritis , Osteoarthritis,    Abdominal   Peds negative pediatric ROS (+)  Hematology  (+) Blood dyscrasia, anemia   Anesthesia Other Findings   Reproductive/Obstetrics negative OB ROS                             Anesthesia Physical Anesthesia Plan  ASA: 4 and emergent  Anesthesia Plan: General   Post-op Pain Management: Minimal or no pain anticipated   Induction: Intravenous, Rapid sequence and Cricoid pressure planned  PONV Risk Score and Plan: 1 and Ondansetron and Dexamethasone  Airway Management Planned: Oral ETT  Additional Equipment:   Intra-op Plan:   Post-operative Plan: Extubation in OR and Possible Post-op intubation/ventilation  Informed Consent: I have reviewed the patients History and Physical, chart, labs and discussed the procedure including the risks, benefits and alternatives for the proposed anesthesia with the patient or  authorized representative who has indicated his/her understanding and acceptance.     Dental advisory given  Plan Discussed with: CRNA and Surgeon  Anesthesia Plan Comments:         Anesthesia Quick Evaluation

## 2022-08-26 NOTE — Op Note (Signed)
[facility] Patient Name: Shawn Velazquez Procedure Date: 08/26/2022 11:04 AM MRN: [id number] Date of Birth: Oct 08, 1965 Attending MD: Elon Alas. Abbey Chatters , [place], [id number] CSN: [id number] Age: 57 Admit Type: Inpatient Procedure:                Upper GI endoscopy Indications:              Acute post hemorrhagic anemia, Hematemesis Providers:                Elon Alas. Abbey Chatters, DO, Janeece Riggers, RN, Aram Candela Referring MD:              Medicines:                See the Anesthesia note for documentation of the                            administered medications Complications:            No immediate complications. Estimated Blood Loss:     Estimated blood loss: none. Procedure:                Pre-Anesthesia Assessment:                           - The anesthesia plan was to use monitored                            anesthesia care (MAC).                           After obtaining informed consent, the endoscope was                            passed under direct vision. Throughout the                            procedure, the patient's blood pressure, pulse, and                            oxygen saturations were monitored continuously. The                            [id number] scope was introduced through the                            mouth, and advanced to the second part of duodenum.                            The upper GI endoscopy was accomplished without                            difficulty. The patient tolerated the procedure                            well. Scope In: 11:28:22 AM Scope Out: 11:36:34 AM Total Procedure Duration: 0 hours 8 minutes 12 seconds  Findings:      Three columns of grade II varices  were found in the middle third of the       esophagus and in the lower third of the esophagus. Red wale signs were       present. Three bands were successfully placed with complete eradication,       resulting in deflation of varices. There was no bleeding during and  at       the end of the procedure.      The entire examined stomach was normal. No gastric varices. No active or       stigmata of bleeding      A single small angiodysplastic lesion without bleeding was found in the       second portion of the duodenum. Impression:               - Grade II esophageal varices. Completely                            eradicated. Banded.                           - Normal stomach.                           - A single non-bleeding angiodysplastic lesion in                            the duodenum.                           - No specimens collected. Moderate Sedation:      Per Anesthesia Care Recommendation:           - Return patient to hospital ward for ongoing care.                           - Clear liquids                           - IV PPI x72 hours. Needs PO PPI BID upon discharge                           - IV octreotide x72 hours                           - Initiate Carafate suspension 1 g before every                            meal and nightly starting in 2 days.                           - IV antiemetics every 6 hours as needed                           - Continue to monitor H&H and transfuse for less                            than 7                           -  ABX x7 days, Rocephin while inpatient                           - Repeat upper endoscopy in 3 to 4 weeks for                            retreatment Procedure Code(s):        --- Professional ---                           [phone], Esophagogastroduodenoscopy, flexible,                            transoral; with band ligation of esophageal/gastric                            varices Diagnosis Code(s):        --- Professional ---                           I85.00, Esophageal varices without bleeding                           K31.819, Angiodysplasia of stomach and duodenum                            without bleeding                           D62, Acute posthemorrhagic anemia                            K92.0, Hematemesis CPT copyright 2022 [employer]. All rights reserved. The codes documented in this report are preliminary and upon coder review may  be revised to meet current compliance requirements. Elon Alas. Abbey Chatters, DO [place] Abbey Chatters, DO 08/26/2022 11:42:37 AM This report has been signed electronically. Number of Addenda: 0

## 2022-08-27 DIAGNOSIS — I8511 Secondary esophageal varices with bleeding: Secondary | ICD-10-CM | POA: Diagnosis not present

## 2022-08-27 DIAGNOSIS — E871 Hypo-osmolality and hyponatremia: Secondary | ICD-10-CM | POA: Diagnosis not present

## 2022-08-27 DIAGNOSIS — K922 Gastrointestinal hemorrhage, unspecified: Secondary | ICD-10-CM | POA: Diagnosis not present

## 2022-08-27 DIAGNOSIS — K7031 Alcoholic cirrhosis of liver with ascites: Secondary | ICD-10-CM | POA: Diagnosis not present

## 2022-08-27 LAB — COMPREHENSIVE METABOLIC PANEL
ALT: 78 U/L — ABNORMAL HIGH (ref 0–44)
AST: 149 U/L — ABNORMAL HIGH (ref 15–41)
Albumin: 2.5 g/dL — ABNORMAL LOW (ref 3.5–5.0)
Alkaline Phosphatase: 130 U/L — ABNORMAL HIGH (ref 38–126)
Anion gap: 7 (ref 5–15)
BUN: 18 mg/dL (ref 6–20)
CO2: 25 mmol/L (ref 22–32)
Calcium: 7.8 mg/dL — ABNORMAL LOW (ref 8.9–10.3)
Chloride: 96 mmol/L — ABNORMAL LOW (ref 98–111)
Creatinine, Ser: 1.01 mg/dL (ref 0.61–1.24)
GFR, Estimated: >60 mL/min (ref >60)
Glucose, Bld: 173 mg/dL — ABNORMAL HIGH (ref 70–99)
Potassium: 3.6 mmol/L (ref 3.5–5.1)
Sodium: 128 mmol/L — ABNORMAL LOW (ref 135–145)
Total Bilirubin: 3.2 mg/dL — ABNORMAL HIGH (ref 0.3–1.2)
Total Protein: 5.4 g/dL — ABNORMAL LOW (ref 6.5–8.1)

## 2022-08-27 LAB — GLUCOSE, CAPILLARY
Glucose-Capillary: 134 mg/dL — ABNORMAL HIGH (ref 70–99)
Glucose-Capillary: 171 mg/dL — ABNORMAL HIGH (ref 70–99)
Glucose-Capillary: 176 mg/dL — ABNORMAL HIGH (ref 70–99)
Glucose-Capillary: 251 mg/dL — ABNORMAL HIGH (ref 70–99)
Glucose-Capillary: 360 mg/dL — ABNORMAL HIGH (ref 70–99)
Glucose-Capillary: 414 mg/dL — ABNORMAL HIGH (ref 70–99)
Glucose-Capillary: 49 mg/dL — ABNORMAL LOW (ref 70–99)

## 2022-08-27 LAB — MAGNESIUM: Magnesium: 2.1 mg/dL (ref 1.7–2.4)

## 2022-08-27 LAB — BPAM RBC
Blood Product Expiration Date: 202403102359
Blood Product Expiration Date: 202403132359
ISSUE DATE / TIME: 202402212106
ISSUE DATE / TIME: 202402220146
Unit Type and Rh: 6200
Unit Type and Rh: 6200

## 2022-08-27 LAB — CBC
HCT: 28.3 % — ABNORMAL LOW (ref 39.0–52.0)
Hemoglobin: 10.5 g/dL — ABNORMAL LOW (ref 13.0–17.0)
MCH: 37 pg — ABNORMAL HIGH (ref 26.0–34.0)
MCHC: 37.1 g/dL — ABNORMAL HIGH (ref 30.0–36.0)
MCV: 99.6 fL (ref 80.0–100.0)
Platelets: 81 10*3/uL — ABNORMAL LOW (ref 150–400)
RBC: 2.84 MIL/uL — ABNORMAL LOW (ref 4.22–5.81)
RDW: 19.3 % — ABNORMAL HIGH (ref 11.5–15.5)
WBC: 11.9 10*3/uL — ABNORMAL HIGH (ref 4.0–10.5)
nRBC: 0 % (ref 0.0–0.2)

## 2022-08-27 LAB — TYPE AND SCREEN
ABO/RH(D): A POS
Antibody Screen: NEGATIVE
Unit division: 0
Unit division: 0

## 2022-08-27 LAB — PROTIME-INR
INR: 1.5 — ABNORMAL HIGH (ref 0.8–1.2)
Prothrombin Time: 18.1 seconds — ABNORMAL HIGH (ref 11.4–15.2)

## 2022-08-27 MED ORDER — INSULIN ASPART 100 UNIT/ML IJ SOLN
0.0000 [IU] | INTRAMUSCULAR | Status: DC
  Administered 2022-08-27: 20 [IU] via SUBCUTANEOUS
  Administered 2022-08-28: 4 [IU] via SUBCUTANEOUS

## 2022-08-27 MED ORDER — SPIRONOLACTONE 25 MG PO TABS
100.0000 mg | ORAL_TABLET | Freq: Every day | ORAL | Status: DC
  Administered 2022-08-27 – 2022-08-29 (×3): 100 mg via ORAL
  Filled 2022-08-27 (×3): qty 4

## 2022-08-27 MED ORDER — FUROSEMIDE 40 MG PO TABS
40.0000 mg | ORAL_TABLET | Freq: Every day | ORAL | Status: DC
  Administered 2022-08-27 – 2022-08-29 (×3): 40 mg via ORAL
  Filled 2022-08-27 (×3): qty 1

## 2022-08-27 MED ORDER — LACTULOSE 10 GM/15ML PO SOLN
10.0000 g | Freq: Two times a day (BID) | ORAL | Status: DC
  Administered 2022-08-27 – 2022-08-29 (×4): 10 g via ORAL
  Filled 2022-08-27 (×4): qty 30

## 2022-08-27 MED ORDER — FLUOXETINE HCL 20 MG PO CAPS
20.0000 mg | ORAL_CAPSULE | Freq: Two times a day (BID) | ORAL | Status: DC
  Administered 2022-08-27 – 2022-08-29 (×5): 20 mg via ORAL
  Filled 2022-08-27 (×5): qty 1

## 2022-08-27 MED ORDER — INSULIN ASPART 100 UNIT/ML IJ SOLN
25.0000 [IU] | Freq: Once | INTRAMUSCULAR | Status: AC
  Administered 2022-08-27: 25 [IU] via SUBCUTANEOUS

## 2022-08-27 MED ORDER — MECLIZINE HCL 12.5 MG PO TABS: 12.5000 mg | ORAL_TABLET | Freq: Three times a day (TID) | ORAL | Status: DC | PRN

## 2022-08-27 MED ORDER — INSULIN GLARGINE-YFGN 100 UNIT/ML ~~LOC~~ SOLN
30.0000 [IU] | Freq: Every day | SUBCUTANEOUS | Status: DC
  Administered 2022-08-27: 30 [IU] via SUBCUTANEOUS
  Filled 2022-08-27: qty 0.3

## 2022-08-27 NOTE — Progress Notes (Signed)
Gastroenterology Progress Note   Referring Provider: No ref. provider found Primary Care Physician:  Celene Squibb, MD Primary Gastroenterologist:  Dr. Elon Alas. Abbey Chatters, DO  Patient ID: [id number] [id number]; [id number]; [birth year]    Subjective   Denies nausea, vomiting, melena. Has had some gas pains to lower abdomen with cramping that improves after BM. Had 1 BM yesterday and has already had 4 loose stools today. Has had some mild chest discomfort with drinking his liquids (described as pain that occurs when you eat/drink too fast). Has had some intermittent confusion. Does have some insomnia and reports anxiety.    Objective   Vital signs in last 24 hours Temp:  [97 F (36.1 C)-98.5 F (36.9 C)] 98.5 F (36.9 C) (02/23 1130) Pulse Rate:  [77-100] 94 (02/23 1130) Resp:  [12-23] 18 (02/23 1130) BP: (121-161)/(70-98) 141/92 (02/23 1130) SpO2:  [92 %-100 %] 99 % (02/23 1130) FiO2 (%):  [21 %] 21 % (02/22 2350) Weight:  [84.9 kg] 84.9 kg (02/23 0528) Last BM Date : 08/26/22  Physical Exam General:   Alert and oriented, pleasant Head:  Normocephalic and atraumatic. Eyes:  No icterus, sclera clear. Conjuctiva pink.  Mouth:  Without lesions, mucosa pink and moist.  Abdomen:  Bowel sounds present, soft, non-tender, non-distended. No HSM or hernias noted. No rebound or guarding. No masses appreciated. Scattered bruising to abdomen.  Msk:  Symmetrical without gross deformities. Normal posture. Extremities:  Without clubbing or edema. Neurologic:  Alert and  oriented x4;  grossly normal neurologically. No asterixis.  Skin:  Warm and dry, intact without significant lesions.  Psych:  Alert and cooperative. Normal mood and affect.  Intake/Output from previous day: 02/22 0701 - 02/23 0700 In: 1733.1 [P.O.:240; I.V.:1393.1; IV Piggyback:100] Out: 1050 [Urine:1050] Intake/Output this shift: No intake/output data recorded.  Lab Results  Recent Labs    08/26/22 1458 08/26/22 2117  08/27/22 0501  WBC 11.0* 10.7* 11.9*  HGB 9.5* 10.1* 10.5*  HCT 26.8* 28.4* 28.3*  PLT 77* 79* 81*   BMET Recent Labs    08/25/22 1558 08/26/22 0810 08/27/22 0501  NA 119* 129* 128*  K 5.0 3.6 3.6  CL 81* 92* 96*  CO2 '27 28 25  '$ GLUCOSE 372* 75 173*  BUN 25* 24* 18  CREATININE 1.17 1.00 1.01  CALCIUM 7.9* 7.7* 7.8*   LFT Recent Labs    08/25/22 1558 08/26/22 0810 08/27/22 0501  PROT 5.8* 5.0* 5.4*  ALBUMIN 2.7* 2.3* 2.5*  AST 126* 97* 149*  ALT 74* 61* 78*  ALKPHOS 149* 116 130*  BILITOT 3.2* 4.0* 3.2*   PT/INR Recent Labs    08/25/22 1558 08/27/22 1025  LABPROT 18.5* 18.1*  INR 1.6* 1.5*   Hepatitis Panel No results for input(s): "HEPBSAG", "HCVAB", "HEPAIGM", "HEPBIGM" in the last 72 hours.   Studies/Results MR LIVER W WO CONTRAST  Result Date: 08/03/2022 CLINICAL DATA:  Cirrhosis.  Fries. EXAM: MRI ABDOMEN WITHOUT AND WITH CONTRAST TECHNIQUE: Multiplanar multisequence MR imaging of the abdomen was performed both before and after the administration of intravenous contrast. CONTRAST:  47m GADAVIST GADOBUTROL 1 MMOL/ML IV SOLN COMPARISON:  CT 08/02/2022 and older. FINDINGS: Lower chest: Trace pleural fluid. Breathing motion along several series. Hepatobiliary: There is a micronodular liver. Overall liver is slightly enlarged. The liver is without areas of restricted diffusion. No hypervascular lesions with washout. The tiny areas of low-density on the prior CT scan are again seen as small subcentimeter foci which are dark on T2 foci which  do not clearly show abnormal enhancement and may be multiple regenerative nodules. Main portal vein has a diameter of 13 mm of the liver hilum. No biliary ductal dilatation. Gallbladder is nondilated. Gallstones Pancreas: Mild pancreatic atrophy. No pancreatic duct dilatation. No enhancing pancreatic mass. Spleen: Spleen has preserved enhancement. Spleen is enlarged at 14.4 cm in AP length. Adrenals/Urinary Tract: Adrenal glands are  preserved. No enhancing renal mass or collecting system dilatation. Stomach/Bowel: Stomach is nondilated. Mild gastric fold thickening. The visualized bowel is nondilated. Normal retrocecal appendix. Vascular/Lymphatic: Normal caliber aorta and IVC. There are scattered varices identified including along the lower esophagus, upper stomach. There are several prominent lymph nodes identified in the upper abdomen including periceliac. Example series 14, image 42 measuring 2.3 x 0.8 cm. Portacaval node series 14, image 43 measuring 20 by 10 mm. These are nonspecific. Other:  Anasarca.  Scattered mesenteric haziness and ascites. Musculoskeletal: Focal L3 spinal hemangioma. IMPRESSION: Evidence of chronic liver disease. Micronodular liver with multiple presumed regenerative nodules. No clear aggressive appearing lesions with restricted diffusion, hypervascularity or washout at this time. Mild ascites. Varices including along the stomach and lower esophagus. Mild splenic enlargement. The portal vein is patent. Nonspecific upper abdominal nodes. Electronically Signed   By: Jill Side M.D.   On: 08/03/2022 18:40   ECHOCARDIOGRAM COMPLETE  Result Date: 08/03/2022    ECHOCARDIOGRAM REPORT   Patient Name:   DIETER HUYCK Date of Exam: 08/03/2022 Medical Rec #:  [id number]         Height:       66.0 in Accession #:    [id number]        Weight:       199.3 lb Date of Birth:  Nov 19, 1965        BSA:          1.997 m Patient Age:    57 years          BP:           137/77 mmHg Patient Gender: M                 HR:           74 bpm. Exam Location:  [name] Procedure: 2D Echo, 3D Echo, Cardiac Doppler, Color Doppler and Strain Analysis Indications:    Dyspnea R06.00  History:        Patient has no prior history of Echocardiogram examinations.                 Signs/Symptoms:Dyspnea and Edema; Risk Factors:Hypertension,                 Diabetes, Dyslipidemia, Former Smoker and Sleep Apnea.  Sonographer:    Greer Pickerel  Referring Phys: [phone] DAVID TAT  Sonographer Comments: Image acquisition challenging due to patient body habitus and Image acquisition challenging due to respiratory motion. Global longitudinal strain was attempted. IMPRESSIONS  1. Left ventricular ejection fraction, by estimation, is 60 to 65%. The left ventricle has normal function. The left ventricle has no regional wall motion abnormalities. There is mild left ventricular hypertrophy. Left ventricular diastolic parameters were normal. The average left ventricular global longitudinal strain is -21.1 %. The global longitudinal strain is normal.  2. Right ventricular systolic function was not well visualized. The right ventricular size is normal.  3. Left atrial size was moderately dilated.  4. The mitral valve is grossly normal. No evidence of mitral valve regurgitation. No evidence of mitral stenosis.  5. The  aortic valve was not well visualized. There is moderate calcification of the aortic valve. Aortic valve regurgitation is not visualized. No aortic stenosis is present. Comparison(s): No prior Echocardiogram. FINDINGS  Left Ventricle: Left ventricular ejection fraction, by estimation, is 60 to 65%. The left ventricle has normal function. The left ventricle has no regional wall motion abnormalities. The average left ventricular global longitudinal strain is -21.1 %. The global longitudinal strain is normal. The left ventricular internal cavity size was normal in size. There is mild left ventricular hypertrophy. Left ventricular diastolic parameters were normal. Right Ventricle: The right ventricular size is normal. No increase in right ventricular wall thickness. Right ventricular systolic function was not well visualized. Left Atrium: Left atrial size was moderately dilated. Right Atrium: Right atrial size was normal in size. Pericardium: There is no evidence of pericardial effusion. Mitral Valve: The mitral valve is grossly normal. No evidence of mitral valve  regurgitation. No evidence of mitral valve stenosis. Tricuspid Valve: The tricuspid valve is grossly normal. Tricuspid valve regurgitation is not demonstrated. No evidence of tricuspid stenosis. Aortic Valve: The aortic valve was not well visualized. There is moderate calcification of the aortic valve. Aortic valve regurgitation is not visualized. No aortic stenosis is present. Pulmonic Valve: The pulmonic valve was not well visualized. Pulmonic valve regurgitation is not visualized. No evidence of pulmonic stenosis. Aorta: The aortic root and ascending aorta are structurally normal, with no evidence of dilitation. Venous: The inferior vena cava was not well visualized. IAS/Shunts: The interatrial septum was not well visualized.  LEFT VENTRICLE PLAX 2D LVIDd:         4.60 cm   Diastology LVIDs:         2.90 cm   LV e' medial:    7.77 cm/s LV PW:         1.20 cm   LV E/e' medial:  12.5 LV IVS:        0.90 cm   LV e' lateral:   11.70 cm/s LVOT diam:     2.10 cm   LV E/e' lateral: 8.3 LV SV:         70 LV SV Index:   35        2D Longitudinal Strain LVOT Area:     3.46 cm  2D Strain GLS Avg:     -21.1 %                           3D Volume EF:                          3D EF:        56 %                          LV EDV:       221 ml                          LV ESV:       97 ml                          LV SV:        124 ml RIGHT VENTRICLE RV S prime:     13.50 cm/s TAPSE (M-mode): 1.8 cm LEFT ATRIUM  Index        RIGHT ATRIUM           Index LA diam:        3.80 cm 1.90 cm/m   RA Area:     12.50 cm LA Vol (A2C):   74.4 ml 37.26 ml/m  RA Volume:   21.10 ml  10.57 ml/m LA Vol (A4C):   78.7 ml 39.41 ml/m LA Biplane Vol: 81.5 ml 40.82 ml/m  AORTIC VALVE LVOT Vmax:   106.00 cm/s LVOT Vmean:  68.400 cm/s LVOT VTI:    0.203 m  AORTA Ao Root diam: 3.40 cm Ao Asc diam:  3.00 cm MITRAL VALVE               TRICUSPID VALVE MV Area (PHT): 4.31 cm    TR Peak grad:   4.7 mmHg MV Decel Time: 176 msec    TR Vmax:         108.00 cm/s MV E velocity: 97.40 cm/s MV A velocity: 81.50 cm/s  SHUNTS MV E/A ratio:  1.20        Systemic VTI:  0.20 m                            Systemic Diam: 2.10 cm Vishnu Priya Mallipeddi Electronically signed by Lorelee Cover Mallipeddi Signature Date/Time: 08/03/2022/1:10:55 PM    Final    US Paracentesis  Result Date: 08/02/2022 INDICATION: Ascites EXAM: ULTRASOUND GUIDED DIAGNOSTIC AND THERAPEUTIC PARACENTESIS MEDICATIONS: None. COMPLICATIONS: None immediate. PROCEDURE: Informed written consent was obtained from the patient after a discussion of the risks, benefits and alternatives to treatment. A timeout was performed prior to the initiation of the procedure. Initial ultrasound scanning demonstrates a large amount of ascites within the right lower abdominal quadrant. The right lower abdomen was prepped and draped in the usual sterile fashion. 1% lidocaine was used for local anesthesia. Following this, a 19 gauge, 7-cm, Yueh catheter was introduced. An ultrasound image was saved for documentation purposes. The paracentesis was performed. The catheter was removed and a dressing was applied. The patient tolerated the procedure well without immediate post procedural complication. Patient received post-procedure intravenous albumin; see nursing notes for details. FINDINGS: A total of approximately 3.5 L of yellow ascitic fluid was removed. Samples were sent to the laboratory as requested by the clinical team. IMPRESSION: Successful ultrasound-guided paracentesis yielding 3.5 liters of peritoneal fluid. Electronically Signed   By: Lavonia Dana M.D.   On: 08/02/2022 16:52   DG Chest Portable 1 View  Result Date: 08/02/2022 CLINICAL DATA:  Shortness of breath EXAM: PORTABLE CHEST 1 VIEW COMPARISON:  09/11/2017 FINDINGS: The heart size and mediastinal contours are within normal limits. Mild diffuse interstitial opacity. The visualized skeletal structures are unremarkable. IMPRESSION: Mild diffuse bilateral  interstitial pulmonary opacity, suggesting edema or atypical/viral infection. No focal airspace opacity. Electronically Signed   By: Delanna Ahmadi M.D.   On: 08/02/2022 14:27   CT ABDOMEN PELVIS W CONTRAST  Result Date: 08/02/2022 CLINICAL DATA:  Abdominal pain, weakness, leg swelling and abdominal swelling for 1 week. EXAM: CT ABDOMEN AND PELVIS WITH CONTRAST TECHNIQUE: Multidetector CT imaging of the abdomen and pelvis was performed using the standard protocol following bolus administration of intravenous contrast. RADIATION DOSE REDUCTION: This exam was performed according to the departmental dose-optimization program which includes automated exposure control, adjustment of the mA and/or kV according to patient size and/or use of iterative reconstruction technique. CONTRAST:  166m  OMNIPAQUE IOHEXOL 300 MG/ML  SOLN COMPARISON:  02/21/2014 FINDINGS: Lower chest: No acute abnormality. No pleural fluid or airspace consolidation. Hepatobiliary: There is hypertrophy of the lateral segment of left hepatic lobe and caudate lobe of liver. The contour the liver appears irregular and nodular. Heterogeneous appearance of the liver parenchyma with numerous tiny low-density foci are scattered throughout which measure up to 7 mm. These are new when compared with 02/21/2014. stones identified within the gallbladder. No significant gallbladder wall thickening or bile duct dilatation. Pancreas: Unremarkable. No pancreatic ductal dilatation or surrounding inflammatory changes. Spleen: Normal in size without focal abnormality. Adrenals/Urinary Tract: Adrenal glands are unremarkable. Kidneys are normal, without renal calculi, focal lesion, or hydronephrosis. Bladder is unremarkable. Stomach/Bowel: Stomach appears normal. No small bowel wall thickening, inflammation or distension. The appendix is visualized and is unremarkable. There is mild edema involving the colon from the cecum to the level of the splenic flexure  Vascular/Lymphatic: Aortic atherosclerosis. The portal vein appears patent. Esophageal and gastric varices identified. Recanalization of the umbilical vein. No abdominopelvic adenopathy. Reproductive: Prostate is unremarkable. Other: There is a moderate volume of ascites within the abdomen and pelvis. No focal fluid collections identified. Right inguinal hernia contains fat as well as ascites. Musculoskeletal: Subcutaneous edema is identified along both flanks as well as the proximal portions of the lower extremities. No acute or suspicious osseous findings. IMPRESSION: 1. Morphologic features of the liver compatible with cirrhosis. 2. Stigmata of portal venous hypertension identified including moderate volume of ascites, esophageal and gastric varices and recanalization of the umbilical vein. 3. Heterogeneous appearance of the liver parenchyma with numerous tiny low-density foci scattered throughout which measure up to 7 mm. These are new when compared with 02/21/2014. These are nonspecific and may reflect multiple dysplastic nodules. Consider follow-up imaging with nonemergent MRI of the liver without and with contrast material for further characterization 4. Gallstones. 5. Mild edema involving the colon from the cecum to the level of the splenic flexure. This is nonspecific and may be related to underlying liver disease. 6. Right inguinal hernia contains fat as well as ascites. 7. Subcutaneous edema is identified along both flanks as well as the proximal portions of the lower extremities. 8.  Aortic Atherosclerosis (ICD10-I70.0). Electronically Signed   By: Kerby Moors M.D.   On: 08/02/2022 13:38    Assessment  57 y.o. male with a history of alcoholic liver cirrhosis, ongoing alcohol dependence, sleep apnea, diabetes, HTN, and recent admission for anasarca who presented to the ED with hematemesis and melena for 3 days.  GI consulted for further evaluation.  Upper GI bleed: Hemoglobin dropped from 12.9 to  8.2 in 1 week.  He received 2 units of PRBCs with improvement of hemoglobin to 9.1.  He remained hemodynamically stable.  Underwent EGD yesterday 08/26/2022 with grade 2 esophageal varices s/p banding x 3 with complete eradication, without bleeding.  Single nonbleeding angiodysplastic lesion in the duodenum.  Has been on octreotide and PPI infusion.  Also on IV Rocephin which he will continue for complete 7-day course.  Plan to start Carafate suspension 1g 4 times daily starting tomorrow. Continue clear liquid diet today given some mild discomfort with drinking liquids, consider advancement tomorrow.   Decompensated alcoholic cirrhosis: Ongoing daily alcohol use.  Had recent admission 08/02/2022 - 08/05/2022 for anasarca and alcoholic hepatitis.  He was discharged on prednisolone however did not have follow-up labs to determine response.  MELD Na on admission 27 and DF 45.9 using low in reference range for  PT.  Evidence of hyponatremia with sodium of 119 on admission.  Patient reported intermittent drowsiness and mild confusion yesterday concerning for possible hepatic encephalopathy.  Started on lactulose yesterday and titrate for 2-3 soft bowel movements daily.  Plan to hold off on prednisolone given ongoing alcohol use.  MELD 3.0 today is 22. Has had 4 loose Bms today and lots of gas with abdominal cramping. Will reduce dose to 10 g BID.   Plan / Recommendations  Continue PPI infusion for 72 hours, p.o. PPI twice daily on discharge Continue octreotide infusion for 72 hours May start Carafate suspension 1 g 4 times daily tomorrow Continue tomorrow H/H, transfuse for hemoglobin less than 7 Continue Rocephin 2 g daily while inpatient, complete total 7 days of antibiotics. Continue to encourage alcohol cessation CMP and INR tomorrow Continue CIWA Continue lactulose, will decrease dose given frequent loose BMs.  Clear liquid diet    LOS: 2 days    08/27/2022, 12:30 PM   Venetia Night, MSN, FNP-BC,  AGACNP-BC [facility]

## 2022-08-27 NOTE — Progress Notes (Signed)
PROGRESS NOTE    Shawn Velazquez  [id number] DOB: 1966-02-21 DOA: 08/25/2022 PCP: Celene Squibb, MD   Brief Narrative:    AVRUMI CORELL is a 57 y.o. male with medical history significant for  alcoholic liver cirrhosis, diabetes mellitus, hypertension, anxiety. Patient presented to the ED with complaints of vomiting blood, he reports for the past 3 days, he has been vomiting a large amount of blood daily.  Patient was admitted with acute blood loss anemia in the setting of suspected upper GI bleed.  He has got undergone EGD with variceal banding and no overt bleeding identified.  Plan is to remain on IV PPI and octreotide for 72 hours and clear liquid diet for now, GI following.  Assessment & Plan:   Principal Problem:   Upper GI bleed Active Problems:   Hyponatremia   Alcoholic cirrhosis of liver with ascites (HCC)   Prolonged QT interval   Hypertension, essential   Anxiety and depression   Elevated LFTs   Alcohol abuse   DM (diabetes mellitus) (HCC)   Symptomatic anemia   ABLA (acute blood loss anemia)  Assessment and Plan:  Acute blood loss anemia secondary to upper GI bleed-stable Hematemesis, hematochezia, melena.  Hemoglobin 8.2, down from baseline 11-13.  With dizziness.  History of liver cirrhosis, last 61/29/24 and MRI liver showing esophageal and gastric varices. -Status post EGD with variceal banding 2/22 -Clear liquid diet -Continue IV PPI and octreotide drips for 72 total hours and then plan for Carafate initiation afterwards with dietary advancement -Continue IV Rocephin while inpatient for total antibiotics x 7 days     Acute on chronic hyponatremia Monitor closely and will be started back on home diuretics   Prolonged QT interval QTc 521.  Not new, present on recent EKG. Potassium 5.  Magnesium 1.8. -Hold fluoxetine   Alcoholic cirrhosis of liver with ascites (HCC) Elevated liver enzymes, thrombocytopenia platelet 97 (slight fluctuating 97-146),  INR 1.6.  Ongoing alcohol abuse.  Last drink was last night.  Drinks wine daily.  No sign of withdrawal at this time - CIWA PRN - Thiamine, folate, multivitamins -Check Phos -Hemoglobin, platelets   DM (diabetes mellitus) (HCC) - HgbA1c- 5.2 - SSI- M -Hold home Lantus 40 units daily, Hold metformin -Clear liquid diet   Elevated LFTs Stable and chronically elevated AST 126, ALT 74, ALP 149.  Total bili 3.2.   Anxiety and depression Resume home Prozac   Hypertension, essential Blood pressure soft, -Resume home Lasix and spironolactone  DVT prophylaxis:SCDs Code Status: Full Family Communication: Discussed with girlfriend at bedside 2/23 Disposition Plan:  Status is: Inpatient Remains inpatient appropriate because: Need for IV medications  Consultants:  GI  Procedures:  EGD with variceal banding 2/22  Antimicrobials:  Anti-infectives (From admission, onward)    Start     Dose/Rate Route Frequency Ordered Stop   08/26/22 1800  cefTRIAXone (ROCEPHIN) 2 g in sodium chloride 0.9 % 100 mL IVPB        2 g 200 mL/hr over 30 Minutes Intravenous Every 24 hours 08/26/22 0818     08/25/22 1715  cefTRIAXone (ROCEPHIN) 2 g in sodium chloride 0.9 % 100 mL IVPB        2 g 200 mL/hr over 30 Minutes Intravenous  Once 08/25/22 1700 08/25/22 1830       Subjective: Patient seen and evaluated today with no new acute complaints or concerns. No acute concerns or events noted overnight.  Denies abdominal pain or further hematemesis.  Tolerating clear liquid diet and no nausea or vomiting.  Objective: Vitals:   08/27/22 0802 08/27/22 0900 08/27/22 1000 08/27/22 1016  BP:    (!) 159/93  Pulse:  94 93 86  Resp:  '18 18 17  '$ Temp: (!) 97 F (36.1 C)     TempSrc: Axillary     SpO2:  96% 96% 96%  Weight:      Height:        Intake/Output Summary (Last 24 hours) at 08/27/2022 1027 Last data filed at 08/27/2022 [id number] Gross per 24 hour  Intake 1733.07 ml  Output 1050 ml  Net 683.07 ml    Filed Weights   08/25/22 1534 08/27/22 0528  Weight: 80.3 kg 84.9 kg    Examination:  General exam: Appears calm and comfortable  Respiratory system: Clear to auscultation. Respiratory effort normal. Cardiovascular system: S1 & S2 heard, RRR.  Gastrointestinal system: Abdomen is soft Central nervous system: Alert and awake Extremities: No edema Skin: No significant lesions noted Psychiatry: Flat affect.    Data Reviewed: I have personally reviewed following labs and imaging studies  CBC: Recent Labs  Lab 08/26/22 0608 08/26/22 1310 08/26/22 1458 08/26/22 2117 08/27/22 0501  WBC 13.4* 12.7* 11.0* 10.7* 11.9*  HGB 9.1* 9.8* 9.5* 10.1* 10.5*  HCT 23.8* 28.3* 26.8* 28.4* 28.3*  MCV 96.7 100.4* 98.9 97.3 99.6  PLT 82* 79* 77* 79* 81*   Basic Metabolic Panel: Recent Labs  Lab 08/25/22 1558 08/26/22 0810 08/27/22 0501  NA 119* 129* 128*  K 5.0 3.6 3.6  CL 81* 92* 96*  CO2 '27 28 25  '$ GLUCOSE 372* 75 173*  BUN 25* 24* 18  CREATININE 1.17 1.00 1.01  CALCIUM 7.9* 7.7* 7.8*  MG 1.8 2.0 2.1   GFR: Estimated Creatinine Clearance: 81.4 mL/min (by C-G formula based on SCr of 1.01 mg/dL). Liver Function Tests: Recent Labs  Lab 08/25/22 1558 08/26/22 0810 08/27/22 0501  AST 126* 97* 149*  ALT 74* 61* 78*  ALKPHOS 149* 116 130*  BILITOT 3.2* 4.0* 3.2*  PROT 5.8* 5.0* 5.4*  ALBUMIN 2.7* 2.3* 2.5*   Recent Labs  Lab 08/25/22 1558  LIPASE 99*   No results for input(s): "AMMONIA" in the last 168 hours. Coagulation Profile: Recent Labs  Lab 08/25/22 1558  INR 1.6*   Cardiac Enzymes: No results for input(s): "CKTOTAL", "CKMB", "CKMBINDEX", "TROPONINI" in the last 168 hours. BNP (last 3 results) No results for input(s): "PROBNP" in the last 8760 hours. HbA1C: No results for input(s): "HGBA1C" in the last 72 hours. CBG: Recent Labs  Lab 08/26/22 2003 08/26/22 2353 08/27/22 0418 08/27/22 0526 08/27/22 0758  GLUCAP 245* 194* 171* 176* 134*   Lipid  Profile: No results for input(s): "CHOL", "HDL", "LDLCALC", "TRIG", "CHOLHDL", "LDLDIRECT" in the last 72 hours. Thyroid Function Tests: No results for input(s): "TSH", "T4TOTAL", "FREET4", "T3FREE", "THYROIDAB" in the last 72 hours. Anemia Panel: No results for input(s): "VITAMINB12", "FOLATE", "FERRITIN", "TIBC", "IRON", "RETICCTPCT" in the last 72 hours. Sepsis Labs: No results for input(s): "PROCALCITON", "LATICACIDVEN" in the last 168 hours.  Recent Results (from the past 240 hour(s))  MRSA Next Gen by PCR, Nasal     Status: None   Collection Time: 08/26/22  4:15 AM   Specimen: Nasal Mucosa; Nasal Swab  Result Value Ref Range Status   MRSA by PCR Next Gen NOT DETECTED NOT DETECTED Final    Comment: (NOTE) The GeneXpert MRSA Assay (FDA approved for NASAL specimens only), is one component of a comprehensive  MRSA colonization surveillance program. It is not intended to diagnose MRSA infection nor to guide or monitor treatment for MRSA infections. Test performance is not FDA approved in patients less than 70 years old. Performed at [facility], [address], [place], [place] [address]          Radiology Studies: No results found.      Scheduled Meds:  Chlorhexidine Gluconate Cloth  6 each Topical Daily   folic acid  1 mg Oral Daily   insulin aspart  0-15 Units Subcutaneous Q4H   lactulose  20 g Oral BID   multivitamin with minerals  1 tablet Oral Daily   [START ON 08/29/2022] pantoprazole  40 mg Intravenous Q12H   thiamine  100 mg Oral Daily   Or   thiamine  100 mg Intravenous Daily   Continuous Infusions:  cefTRIAXone (ROCEPHIN)  IV Stopped (08/26/22 1712)   octreotide (SANDOSTATIN) 500 mcg in sodium chloride 0.9 % 250 mL (2 mcg/mL) infusion 50 mcg/hr (08/27/22 [id number])   pantoprazole 8 mg/hr (08/27/22 0619)     LOS: 2 days    Time spent: 35 minutes    Solina Heron Darleen Crocker, DO [facility]  If 7PM-7AM, please contact  night-coverage [email or web address] 08/27/2022, 10:27 AM

## 2022-08-28 DIAGNOSIS — R601 Generalized edema: Secondary | ICD-10-CM

## 2022-08-28 DIAGNOSIS — I8511 Secondary esophageal varices with bleeding: Secondary | ICD-10-CM | POA: Diagnosis not present

## 2022-08-28 DIAGNOSIS — K7682 Hepatic encephalopathy: Secondary | ICD-10-CM

## 2022-08-28 DIAGNOSIS — K922 Gastrointestinal hemorrhage, unspecified: Secondary | ICD-10-CM | POA: Diagnosis not present

## 2022-08-28 LAB — COMPREHENSIVE METABOLIC PANEL
ALT: 72 U/L — ABNORMAL HIGH (ref 0–44)
AST: 136 U/L — ABNORMAL HIGH (ref 15–41)
Albumin: 2.3 g/dL — ABNORMAL LOW (ref 3.5–5.0)
Alkaline Phosphatase: 166 U/L — ABNORMAL HIGH (ref 38–126)
Anion gap: 6 (ref 5–15)
BUN: 12 mg/dL (ref 6–20)
CO2: 25 mmol/L (ref 22–32)
Calcium: 7.6 mg/dL — ABNORMAL LOW (ref 8.9–10.3)
Chloride: 97 mmol/L — ABNORMAL LOW (ref 98–111)
Creatinine, Ser: 1.12 mg/dL (ref 0.61–1.24)
GFR, Estimated: >60 mL/min (ref >60)
Glucose, Bld: 150 mg/dL — ABNORMAL HIGH (ref 70–99)
Potassium: 3.6 mmol/L (ref 3.5–5.1)
Sodium: 128 mmol/L — ABNORMAL LOW (ref 135–145)
Total Bilirubin: 2.3 mg/dL — ABNORMAL HIGH (ref 0.3–1.2)
Total Protein: 5.1 g/dL — ABNORMAL LOW (ref 6.5–8.1)

## 2022-08-28 LAB — CBC
HCT: 26.1 % — ABNORMAL LOW (ref 39.0–52.0)
Hemoglobin: 9.7 g/dL — ABNORMAL LOW (ref 13.0–17.0)
MCH: 38 pg — ABNORMAL HIGH (ref 26.0–34.0)
MCHC: 37.2 g/dL — ABNORMAL HIGH (ref 30.0–36.0)
MCV: 102.4 fL — ABNORMAL HIGH (ref 80.0–100.0)
Platelets: 83 10*3/uL — ABNORMAL LOW (ref 150–400)
RBC: 2.55 MIL/uL — ABNORMAL LOW (ref 4.22–5.81)
RDW: 18.6 % — ABNORMAL HIGH (ref 11.5–15.5)
WBC: 12.7 10*3/uL — ABNORMAL HIGH (ref 4.0–10.5)
nRBC: 0 % (ref 0.0–0.2)

## 2022-08-28 LAB — GLUCOSE, CAPILLARY
Glucose-Capillary: 111 mg/dL — ABNORMAL HIGH (ref 70–99)
Glucose-Capillary: 120 mg/dL — ABNORMAL HIGH (ref 70–99)
Glucose-Capillary: 140 mg/dL — ABNORMAL HIGH (ref 70–99)
Glucose-Capillary: 166 mg/dL — ABNORMAL HIGH (ref 70–99)
Glucose-Capillary: 215 mg/dL — ABNORMAL HIGH (ref 70–99)
Glucose-Capillary: 231 mg/dL — ABNORMAL HIGH (ref 70–99)
Glucose-Capillary: 260 mg/dL — ABNORMAL HIGH (ref 70–99)
Glucose-Capillary: 287 mg/dL — ABNORMAL HIGH (ref 70–99)
Glucose-Capillary: 292 mg/dL — ABNORMAL HIGH (ref 70–99)

## 2022-08-28 LAB — MAGNESIUM: Magnesium: 1.9 mg/dL (ref 1.7–2.4)

## 2022-08-28 LAB — PROTIME-INR
INR: 1.6 — ABNORMAL HIGH (ref 0.8–1.2)
Prothrombin Time: 18.7 seconds — ABNORMAL HIGH (ref 11.4–15.2)

## 2022-08-28 MED ORDER — INSULIN GLARGINE-YFGN 100 UNIT/ML ~~LOC~~ SOLN
15.0000 [IU] | Freq: Every day | SUBCUTANEOUS | Status: DC
  Administered 2022-08-28: 15 [IU] via SUBCUTANEOUS
  Filled 2022-08-28 (×2): qty 0.15

## 2022-08-28 MED ORDER — INSULIN ASPART 100 UNIT/ML IJ SOLN
0.0000 [IU] | Freq: Three times a day (TID) | INTRAMUSCULAR | Status: DC
  Administered 2022-08-28: 8 [IU] via SUBCUTANEOUS
  Administered 2022-08-28: 5 [IU] via SUBCUTANEOUS
  Administered 2022-08-29: 3 [IU] via SUBCUTANEOUS

## 2022-08-28 MED ORDER — INSULIN ASPART 100 UNIT/ML IJ SOLN
0.0000 [IU] | Freq: Every day | INTRAMUSCULAR | Status: DC
  Administered 2022-08-28: 2 [IU] via SUBCUTANEOUS

## 2022-08-28 MED ORDER — CARVEDILOL 3.125 MG PO TABS
3.1250 mg | ORAL_TABLET | Freq: Two times a day (BID) | ORAL | Status: DC
  Administered 2022-08-28 – 2022-08-29 (×3): 3.125 mg via ORAL
  Filled 2022-08-28 (×3): qty 1

## 2022-08-28 MED ORDER — SUCRALFATE 1 GM/10ML PO SUSP
1.0000 g | Freq: Three times a day (TID) | ORAL | Status: DC
  Administered 2022-08-28 – 2022-08-29 (×5): 1 g via ORAL
  Filled 2022-08-28 (×5): qty 10

## 2022-08-28 NOTE — Progress Notes (Addendum)
Patient CBG is 49. Hypoglycemic order initiated. MD notified. Patient taking PO's, alert and oriented. Plan of care ongoing.

## 2022-08-28 NOTE — Progress Notes (Signed)
CBG recheck is 111. MD notified. Plan of care ongoing.

## 2022-08-28 NOTE — Progress Notes (Addendum)
Shawn Velazquez, M.D. Gastroenterology & Hepatology   Interval History:  No acute events overnight. Patient reports having discomfort in his chest when swallowing but no dysphagia.  No nausea, vomiting, fever or chills.  States that his bowel movements have improved although he had 5 bowel movements yesterday but none today. Hemoglobin today decreased to 9.7 but he denies having any melena or hematochezia.  BUN remains low at 12.  Inpatient Medications:  Current Facility-Administered Medications:    carvedilol (COREG) tablet 3.125 mg, 3.125 mg, Oral, BID WC, Shah, Pratik D, DO   cefTRIAXone (ROCEPHIN) 2 g in sodium chloride 0.9 % 100 mL IVPB, 2 g, Intravenous, Q24H, Mahala Menghini, PA-C, Stopped at 08/27/22 1858   Chlorhexidine Gluconate Cloth 2 % PADS 6 each, 6 each, Topical, Daily, Emokpae, Ejiroghene E, MD, 6 each at 08/27/22 0802   FLUoxetine (PROZAC) capsule 20 mg, 20 mg, Oral, BID, Manuella Ghazi, Pratik D, DO, 20 mg at [id number]   folic acid (FOLVITE) tablet 1 mg, 1 mg, Oral, Daily, Emokpae, Ejiroghene E, MD, 1 mg at 08/27/22 0802   furosemide (LASIX) tablet 40 mg, 40 mg, Oral, Daily, Manuella Ghazi, Pratik D, DO, 40 mg at 08/27/22 1130   insulin aspart (novoLOG) injection 0-15 Units, 0-15 Units, Subcutaneous, TID WC, Shah, Pratik D, DO   insulin aspart (novoLOG) injection 0-5 Units, 0-5 Units, Subcutaneous, QHS, Shah, Pratik D, DO   insulin glargine-yfgn (SEMGLEE) injection 15 Units, 15 Units, Subcutaneous, QHS, Shah, Pratik D, DO   lactulose (CHRONULAC) 10 GM/15ML solution 10 g, 10 g, Oral, BID, Mahon, Courtney L, NP, 10 g at 08/27/22 2210   LORazepam (ATIVAN) tablet 1-4 mg, 1-4 mg, Oral, Q1H PRN, 1 mg at 08/27/22 2008 **OR** LORazepam (ATIVAN) injection 1-4 mg, 1-4 mg, Intravenous, Q1H PRN, Emokpae, Ejiroghene E, MD, 2 mg at 08/25/22 2351   meclizine (ANTIVERT) tablet 12.5 mg, 12.5 mg, Oral, TID PRN, Manuella Ghazi, Pratik D, DO   multivitamin with minerals tablet 1 tablet, 1 tablet, Oral, Daily, Emokpae,  Ejiroghene E, MD, 1 tablet at 08/27/22 0802   [COMPLETED] octreotide (SANDOSTATIN) 2 mcg/mL load via infusion 50 mcg, 50 mcg, Intravenous, Once, 50 mcg at 08/25/22 1750 **AND** octreotide (SANDOSTATIN) 500 mcg in sodium chloride 0.9 % 250 mL (2 mcg/mL) infusion, 50 mcg/hr, Intravenous, Continuous, Emokpae, Ejiroghene E, MD, Last Rate: 25 mL/hr at 08/28/22 0308, 50 mcg/hr at 08/28/22 0308   [START ON 08/29/2022] pantoprazole (PROTONIX) injection 40 mg, 40 mg, Intravenous, Q12H, Emokpae, Ejiroghene E, MD   pantoprozole (PROTONIX) 80 mg /NS 100 mL infusion, 8 mg/hr, Intravenous, Continuous, Emokpae, Ejiroghene E, MD, Last Rate: 10 mL/hr at 08/28/22 0308, 8 mg/hr at 08/28/22 0308   polyethylene glycol (MIRALAX / GLYCOLAX) packet 17 g, 17 g, Oral, Daily PRN, Emokpae, Ejiroghene E, MD   spironolactone (ALDACTONE) tablet 100 mg, 100 mg, Oral, Daily, Manuella Ghazi, Pratik D, DO, 100 mg at 08/27/22 1129   sucralfate (CARAFATE) 1 GM/10ML suspension 1 g, 1 g, Oral, TID WC & HS, Shah, Pratik D, DO   thiamine (VITAMIN B1) tablet 100 mg, 100 mg, Oral, Daily, 100 mg at 08/27/22 0802 **OR** thiamine (VITAMIN B1) injection 100 mg, 100 mg, Intravenous, Daily, Emokpae, Ejiroghene E, MD   I/O    Intake/Output Summary (Last 24 hours) at 08/28/2022 0900 Last data filed at 08/28/2022 0308 Gross per 24 hour  Intake 1789.15 ml  Output --  Net 1789.15 ml     Physical Exam: Temp:  [98 F (36.7 C)-98.6 F (37 C)] 98 F (36.7 C) (  02/24 0422) Pulse Rate:  [86-107] 91 (02/24 0422) Resp:  [16-18] 16 (02/24 0422) BP: (120-159)/(75-93) 129/86 (02/24 0422) SpO2:  [95 %-100 %] 95 % (02/24 0422)  Temp (24hrs), Avg:98.4 F (36.9 C), Min:98 F (36.7 C), Max:98.6 F (37 C)  GENERAL: The patient is AO x3, in no acute distress. HEENT: Head is normocephalic and atraumatic. EOMI are intact. Mouth is well hydrated and without lesions. NECK: Supple. No masses LUNGS: Clear to auscultation. No presence of rhonchi/wheezing/rales. Adequate  chest expansion HEART: RRR, normal s1 and s2. ABDOMEN: Soft, nontender, no guarding, no peritoneal signs, and nondistended. BS +. No masses. EXTREMITIES: Without any cyanosis, clubbing, rash, lesions or edema. NEUROLOGIC: AOx3, no focal motor deficit. SKIN: no jaundice, no rashes  Laboratory Data: CBC:     Component Value Date/Time   WBC 12.7 (H) 08/28/2022 0251   RBC 2.55 (L) 08/28/2022 0251   HGB 9.7 (L) 08/28/2022 0251   HGB 14.4 07/31/2021 1204   HCT 26.1 (L) 08/28/2022 0251   HCT 42.4 07/31/2021 1204   PLT 83 (L) 08/28/2022 0251   PLT 146 (L) 07/31/2021 1204   MCV 102.4 (H) 08/28/2022 0251   MCV 97 07/31/2021 1204   MCH 38.0 (H) 08/28/2022 0251   MCHC 37.2 (H) 08/28/2022 0251   RDW 18.6 (H) 08/28/2022 0251   RDW 11.8 07/31/2021 1204   LYMPHSABS 1.1 08/18/2022 0958   LYMPHSABS 1.6 07/31/2021 1204   MONOABS 1.0 08/18/2022 0958   EOSABS 0.3 08/18/2022 0958   EOSABS 0.2 07/31/2021 1204   BASOSABS 0.0 08/18/2022 0958   BASOSABS 0.1 07/31/2021 1204   COAG:  Lab Results  Component Value Date   INR 1.6 (H) 08/28/2022   INR 1.5 (H) 08/27/2022   INR 1.6 (H) 08/25/2022    BMP:     Latest Ref Rng & Units 08/28/2022    2:51 AM 08/27/2022    5:01 AM 08/26/2022    8:10 AM  BMP  Glucose 70 - 99 mg/dL 150  173  75   BUN 6 - 20 mg/dL '12  18  24   '$ Creatinine 0.61 - 1.24 mg/dL 1.12  1.01  1.00   Sodium 135 - 145 mmol/L 128  128  129   Potassium 3.5 - 5.1 mmol/L 3.6  3.6  3.6   Chloride 98 - 111 mmol/L 97  96  92   CO2 22 - 32 mmol/L '25  25  28   '$ Calcium 8.9 - 10.3 mg/dL 7.6  7.8  7.7     HEPATIC:     Latest Ref Rng & Units 08/28/2022    2:51 AM 08/27/2022    5:01 AM 08/26/2022    8:10 AM  Hepatic Function  Total Protein 6.5 - 8.1 g/dL 5.1  5.4  5.0   Albumin 3.5 - 5.0 g/dL 2.3  2.5  2.3   AST 15 - 41 U/L 136  149  97   ALT 0 - 44 U/L 72  78  61   Alk Phosphatase 38 - 126 U/L 166  130  116   Total Bilirubin 0.3 - 1.2 mg/dL 2.3  3.2  4.0     CARDIAC:  Lab Results   Component Value Date   CKTOTAL 185 03/06/2011   CKMB 4.1 (H) 03/06/2011   TROPONINI <0.30 04/20/2012      Imaging: I personally reviewed and interpreted the available labs, imaging and endoscopic files.   Assessment/Plan: 57 year old male with past medical history of alcoholic liver cirrhosis complicated  by bleeding esophageal varices and ascites, sleep apnea, diabetes, hypertension, who came to the hospital after presenting worsening hematemesis and melena.  Patient had significant drop in his hemoglobin for which he underwent an emergent EGD.  Was found to have grade 2 esophageal varices which were banded x 3, there was a single AVM in his duodenum but no other abnormalities were found.After his procedure he has remained hemodynamically stable and without any clinical evidence of recurrent bleeding.  Hemoglobin is slightly lower today.  He should finish a PPI drip for 72 hours along with octreotide and he should be transition to twice daily PPI afterwards.  She will finish a 7-day course of third-generation cephalosporin for SBP prophylaxis.  He will need a repeat EGD in 4 to 8 weeks.  Was started on Coreg today in the morning which should be uptitrated as outpatient.  In terms of his decompensated alcoholic liver disease, he had some mild encephalopathy which is currently being treated with lactulose.  This should be titrated to achieve 2-3 bowel movements per day.  No need to add rifaximin for now.  In terms of his third spacing,should continue with furosemide 40 mg and spironolactone 100 mg every day . MELD 3.0 today - 23.   Alcohol cessation was highly encouraged.  Continue PPI infusion for 72 hours, p.o. PPI twice daily afterwards Continue octreotide infusion for 72 hours Carafate suspension 1 g 2 times daily for 2 weeks Furosemide 40 mg qday.  spironolactone 100 mg every day Continue daily  H/H and MELD labs, transfuse for hemoglobin less than 7 Continue Rocephin 2 g daily while  inpatient, complete total 7 days. Alcohol cessation Continue CIWA Continue lactulose, titrate to achieve 2-3 Bms per day Soft diet Carvedilol 3.125 mg BID Outpatient evaluation at transplant center given high MELD and decompensAting events.  Shawn Peppers, MD Gastroenterology and Hepatology [facility] Gastroenterology

## 2022-08-28 NOTE — Progress Notes (Signed)
PROGRESS NOTE    Shawn Velazquez  [id number] DOB: [birth year] DOA: 08/25/2022 PCP: Celene Squibb, MD   Brief Narrative:    Shawn Velazquez is a 57 y.o. male with medical history significant for  alcoholic liver cirrhosis, diabetes mellitus, hypertension, anxiety. Patient presented to the ED with complaints of vomiting blood, he reports for the past 3 days, he has been vomiting a large amount of blood daily.  Patient was admitted with acute blood loss anemia in the setting of suspected upper GI bleed.  He has got undergone EGD with variceal banding and no overt bleeding identified.  Plan is to remain on IV PPI and octreotide for 72 hours which will end this evening.  He is currently on soft diet and GI following.  Started on Carafate and Coreg 2/24 per GI recommendations.  Assessment & Plan:   Principal Problem:   Upper GI bleed Active Problems:   Hyponatremia   Alcoholic cirrhosis of liver with ascites (HCC)   Prolonged QT interval   Hypertension, essential   Anxiety and depression   Elevated LFTs   Alcohol abuse   DM (diabetes mellitus) (HCC)   Symptomatic anemia   ABLA (acute blood loss anemia)   Esophageal varices with bleeding in diseases classified elsewhere ([facility])  Assessment and Plan:  Acute blood loss anemia secondary to upper GI bleed-stable Hematemesis, hematochezia, melena.  Hemoglobin 8.2, down from baseline 11-13.  With dizziness.  History of liver cirrhosis, last 61/29/24 and MRI liver showing esophageal and gastric varices. -Status post EGD with variceal banding 2/22 -Soft diet -Continue IV PPI and octreotide drips for 72 total hours -Continue IV Rocephin while inpatient for total antibiotics x 7 days (d4/7) -Started on Carafate 2/24     Acute on chronic hyponatremia Monitor closely and will be started back on home diuretics   Prolonged QT interval QTc 521.  Not new, present on recent EKG. Potassium 5.  Magnesium 1.8. -Hold fluoxetine   Alcoholic  cirrhosis of liver with ascites (HCC) Elevated liver enzymes, thrombocytopenia platelet 97 (slight fluctuating 97-146), INR 1.6.  Ongoing alcohol abuse.  Last drink was last night.  Drinks wine daily.  No sign of withdrawal at this time - CIWA PRN - Thiamine, folate, multivitamins -Check Phos -Hemoglobin, platelets   DM (diabetes mellitus) (HCC)-labile - HgbA1c- 5.2 - SSI- M -Noted to have hypoglycemia overnight and Semglee adjusted to 15 units -Soft diet   Elevated LFTs Stable and chronically elevated AST 126, ALT 74, ALP 149.  Total bili 3.2.   Anxiety and depression Resume home Prozac   Hypertension, essential -Resumed home Lasix and spironolactone -Started on Coreg 2/24, monitor closely  DVT prophylaxis:SCDs Code Status: Full Family Communication: Discussed with girlfriend at bedside 2/23 Disposition Plan:  Status is: Inpatient Remains inpatient appropriate because: Need for IV medications  Consultants:  GI  Procedures:  EGD with variceal banding 2/22  Antimicrobials:  Anti-infectives (From admission, onward)    Start     Dose/Rate Route Frequency Ordered Stop   08/26/22 1800  cefTRIAXone (ROCEPHIN) 2 g in sodium chloride 0.9 % 100 mL IVPB        2 g 200 mL/hr over 30 Minutes Intravenous Every 24 hours 08/26/22 0818     08/25/22 1715  cefTRIAXone (ROCEPHIN) 2 g in sodium chloride 0.9 % 100 mL IVPB        2 g 200 mL/hr over 30 Minutes Intravenous  Once 08/25/22 1700 08/25/22 1830       Subjective:  Patient seen and evaluated today with no new acute complaints or concerns. No acute concerns or events noted overnight.  Denies abdominal pain or further hematemesis.  Tolerating soft diet, but complains of food apparently getting stuck in his stomach.  Objective: Vitals:   08/27/22 2027 08/27/22 2351 08/28/22 0422 08/28/22 0857  BP: 120/81 123/75 129/86 132/78  Pulse: 99 (!) 107 91 82  Resp: '16 18 16   '$ Temp: 98.6 F (37 C) 98.5 F (36.9 C) 98 F (36.7 C)    TempSrc: Oral     SpO2: 96% 97% 95%   Weight:      Height:        Intake/Output Summary (Last 24 hours) at 08/28/2022 0925 Last data filed at 08/28/2022 0308 Gross per 24 hour  Intake 1789.15 ml  Output --  Net 1789.15 ml   Filed Weights   08/25/22 1534 08/27/22 0528  Weight: 80.3 kg 84.9 kg    Examination:  General exam: Appears calm and comfortable  Respiratory system: Clear to auscultation. Respiratory effort normal. Cardiovascular system: S1 & S2 heard, RRR.  Gastrointestinal system: Abdomen is soft Central nervous system: Alert and awake Extremities: No edema Skin: No significant lesions noted Psychiatry: Flat affect.    Data Reviewed: I have personally reviewed following labs and imaging studies  CBC: Recent Labs  Lab 08/26/22 1310 08/26/22 1458 08/26/22 2117 08/27/22 0501 08/28/22 0251  WBC 12.7* 11.0* 10.7* 11.9* 12.7*  HGB 9.8* 9.5* 10.1* 10.5* 9.7*  HCT 28.3* 26.8* 28.4* 28.3* 26.1*  MCV 100.4* 98.9 97.3 99.6 102.4*  PLT 79* 77* 79* 81* 83*   Basic Metabolic Panel: Recent Labs  Lab 08/25/22 1558 08/26/22 0810 08/27/22 0501 08/28/22 0251  NA 119* 129* 128* 128*  K 5.0 3.6 3.6 3.6  CL 81* 92* 96* 97*  CO2 '27 28 25 25  '$ GLUCOSE 372* 75 173* 150*  BUN 25* 24* 18 12  CREATININE 1.17 1.00 1.01 1.12  CALCIUM 7.9* 7.7* 7.8* 7.6*  MG 1.8 2.0 2.1 1.9   GFR: Estimated Creatinine Clearance: 73.4 mL/min (by C-G formula based on SCr of 1.12 mg/dL). Liver Function Tests: Recent Labs  Lab 08/25/22 1558 08/26/22 0810 08/27/22 0501 08/28/22 0251  AST 126* 97* 149* 136*  ALT 74* 61* 78* 72*  ALKPHOS 149* 116 130* 166*  BILITOT 3.2* 4.0* 3.2* 2.3*  PROT 5.8* 5.0* 5.4* 5.1*  ALBUMIN 2.7* 2.3* 2.5* 2.3*   Recent Labs  Lab 08/25/22 1558  LIPASE 99*   No results for input(s): "AMMONIA" in the last 168 hours. Coagulation Profile: Recent Labs  Lab 08/25/22 1558 08/27/22 1025 08/28/22 0251  INR 1.6* 1.5* 1.6*   Cardiac Enzymes: No results  for input(s): "CKTOTAL", "CKMB", "CKMBINDEX", "TROPONINI" in the last 168 hours. BNP (last 3 results) No results for input(s): "PROBNP" in the last 8760 hours. HbA1C: No results for input(s): "HGBA1C" in the last 72 hours. CBG: Recent Labs  Lab 08/27/22 2353 08/28/22 0029 08/28/22 0204 08/28/22 0423 08/28/22 0712  GLUCAP 49* 111* 140* 166* 120*   Lipid Profile: No results for input(s): "CHOL", "HDL", "LDLCALC", "TRIG", "CHOLHDL", "LDLDIRECT" in the last 72 hours. Thyroid Function Tests: No results for input(s): "TSH", "T4TOTAL", "FREET4", "T3FREE", "THYROIDAB" in the last 72 hours. Anemia Panel: No results for input(s): "VITAMINB12", "FOLATE", "FERRITIN", "TIBC", "IRON", "RETICCTPCT" in the last 72 hours. Sepsis Labs: No results for input(s): "PROCALCITON", "LATICACIDVEN" in the last 168 hours.  Recent Results (from the past 240 hour(s))  MRSA Next Gen by PCR, Nasal  Status: None   Collection Time: 08/26/22  4:15 AM   Specimen: Nasal Mucosa; Nasal Swab  Result Value Ref Range Status   MRSA by PCR Next Gen NOT DETECTED NOT DETECTED Final    Comment: (NOTE) The GeneXpert MRSA Assay (FDA approved for NASAL specimens only), is one component of a comprehensive MRSA colonization surveillance program. It is not intended to diagnose MRSA infection nor to guide or monitor treatment for MRSA infections. Test performance is not FDA approved in patients less than 66 years old. Performed at [facility], [address] [place], [place] [address]          Radiology Studies: No results found.      Scheduled Meds:  carvedilol  3.125 mg Oral BID WC   Chlorhexidine Gluconate Cloth  6 each Topical Daily   FLUoxetine  20 mg Oral BID   folic acid  1 mg Oral Daily   furosemide  40 mg Oral Daily   insulin aspart  0-15 Units Subcutaneous TID WC   insulin aspart  0-5 Units Subcutaneous QHS   insulin glargine-yfgn  15 Units Subcutaneous QHS   lactulose  10 g Oral BID    multivitamin with minerals  1 tablet Oral Daily   [START ON 08/29/2022] pantoprazole  40 mg Intravenous Q12H   spironolactone  100 mg Oral Daily   sucralfate  1 g Oral TID WC & HS   thiamine  100 mg Oral Daily   Or   thiamine  100 mg Intravenous Daily   Continuous Infusions:  cefTRIAXone (ROCEPHIN)  IV Stopped (08/27/22 1858)   octreotide (SANDOSTATIN) 500 mcg in sodium chloride 0.9 % 250 mL (2 mcg/mL) infusion 50 mcg/hr (08/28/22 0308)   pantoprazole 8 mg/hr (08/28/22 0308)     LOS: 3 days    Time spent: 35 minutes    Brandonn Capelli D Manuella Ghazi, DO [facility]  If 7PM-7AM, please contact night-coverage [email or web address] 08/28/2022, 9:25 AM

## 2022-08-29 ENCOUNTER — Telehealth (INDEPENDENT_AMBULATORY_CARE_PROVIDER_SITE_OTHER): Payer: Self-pay | Admitting: Gastroenterology

## 2022-08-29 DIAGNOSIS — K7682 Hepatic encephalopathy: Secondary | ICD-10-CM | POA: Diagnosis not present

## 2022-08-29 DIAGNOSIS — K7031 Alcoholic cirrhosis of liver with ascites: Secondary | ICD-10-CM | POA: Diagnosis not present

## 2022-08-29 DIAGNOSIS — I8511 Secondary esophageal varices with bleeding: Secondary | ICD-10-CM | POA: Diagnosis not present

## 2022-08-29 DIAGNOSIS — K922 Gastrointestinal hemorrhage, unspecified: Secondary | ICD-10-CM | POA: Diagnosis not present

## 2022-08-29 LAB — COMPREHENSIVE METABOLIC PANEL
ALT: 59 U/L — ABNORMAL HIGH (ref 0–44)
AST: 84 U/L — ABNORMAL HIGH (ref 15–41)
Albumin: 2.1 g/dL — ABNORMAL LOW (ref 3.5–5.0)
Alkaline Phosphatase: 167 U/L — ABNORMAL HIGH (ref 38–126)
Anion gap: 5 (ref 5–15)
BUN: 9 mg/dL (ref 6–20)
CO2: 23 mmol/L (ref 22–32)
Calcium: 7.5 mg/dL — ABNORMAL LOW (ref 8.9–10.3)
Chloride: 100 mmol/L (ref 98–111)
Creatinine, Ser: 0.86 mg/dL (ref 0.61–1.24)
GFR, Estimated: >60 mL/min (ref >60)
Glucose, Bld: 205 mg/dL — ABNORMAL HIGH (ref 70–99)
Potassium: 3.9 mmol/L (ref 3.5–5.1)
Sodium: 128 mmol/L — ABNORMAL LOW (ref 135–145)
Total Bilirubin: 2 mg/dL — ABNORMAL HIGH (ref 0.3–1.2)
Total Protein: 4.9 g/dL — ABNORMAL LOW (ref 6.5–8.1)

## 2022-08-29 LAB — CBC
HCT: 26.2 % — ABNORMAL LOW (ref 39.0–52.0)
Hemoglobin: 9.4 g/dL — ABNORMAL LOW (ref 13.0–17.0)
MCH: 36.7 pg — ABNORMAL HIGH (ref 26.0–34.0)
MCHC: 35.9 g/dL (ref 30.0–36.0)
MCV: 102.3 fL — ABNORMAL HIGH (ref 80.0–100.0)
Platelets: 89 10*3/uL — ABNORMAL LOW (ref 150–400)
RBC: 2.56 MIL/uL — ABNORMAL LOW (ref 4.22–5.81)
RDW: 17.6 % — ABNORMAL HIGH (ref 11.5–15.5)
WBC: 9.4 10*3/uL (ref 4.0–10.5)
nRBC: 0 % (ref 0.0–0.2)

## 2022-08-29 LAB — GLUCOSE, CAPILLARY
Glucose-Capillary: 204 mg/dL — ABNORMAL HIGH (ref 70–99)
Glucose-Capillary: 155 mg/dL — ABNORMAL HIGH (ref 70–99)

## 2022-08-29 LAB — PROTIME-INR
INR: 1.7 — ABNORMAL HIGH (ref 0.8–1.2)
Prothrombin Time: 19.6 seconds — ABNORMAL HIGH (ref 11.4–15.2)

## 2022-08-29 LAB — MAGNESIUM: Magnesium: 1.6 mg/dL — ABNORMAL LOW (ref 1.7–2.4)

## 2022-08-29 MED ORDER — CARVEDILOL 3.125 MG PO TABS: 3.1250 mg | ORAL_TABLET | Freq: Two times a day (BID) | ORAL | 1 refills | Status: DC

## 2022-08-29 MED ORDER — LACTULOSE 10 GM/15ML PO SOLN: 10.0000 g | Freq: Two times a day (BID) | ORAL | 0 refills | Status: DC

## 2022-08-29 MED ORDER — SUCRALFATE 1 GM/10ML PO SUSP: 1.0000 g | Freq: Three times a day (TID) | ORAL | 0 refills | Status: DC

## 2022-08-29 MED ORDER — CEFDINIR 300 MG PO CAPS: 300.0000 mg | ORAL_CAPSULE | Freq: Two times a day (BID) | ORAL | 0 refills | Status: AC

## 2022-08-29 MED ORDER — SODIUM CHLORIDE 0.9 % IV SOLN: 2.0000 g | INTRAVENOUS | Status: DC

## 2022-08-29 MED ORDER — PANTOPRAZOLE SODIUM 40 MG PO TBEC: 40.0000 mg | DELAYED_RELEASE_TABLET | Freq: Two times a day (BID) | ORAL | 0 refills | Status: DC

## 2022-08-29 NOTE — Discharge Summary (Signed)
Physician Discharge Summary  Shawn Velazquez [id number] DOB: February 12, 1966 DOA: 08/25/2022  PCP: Celene Squibb, MD  Admit date: 08/25/2022  Discharge date: 08/29/2022  Admitted From:Home  Disposition:  Home  Recommendations for Outpatient Follow-up:  Follow up with PCP in 1-2 weeks Follow-up with gastroenterology which will be scheduled Continue on Carafate and PPI twice daily as prescribed Continue home diuretics and lactulose Continue Omnicef for 2 more days to complete total 7-day course of antibiotics Continue on Coreg twice daily as prescribed Continue other home medications as prior  Home Health: None  Equipment/Devices: None  Discharge Condition:Stable  CODE STATUS: Full  Diet recommendation: Heart Healthy/carb modified  Brief/Interim Summary: Shawn Velazquez is a 57 y.o. male with medical history significant for  alcoholic liver cirrhosis, diabetes mellitus, hypertension, anxiety. Patient presented to the ED with complaints of vomiting blood, he reports for the past 3 days, he has been vomiting a large amount of blood daily.  Patient was admitted with acute blood loss anemia in the setting of suspected upper GI bleed.  He has got undergone EGD with variceal banding on 2/22 and no overt bleeding identified.  He remained on IV octreotide and Protonix infusions for approximately 72 hours and has now been converted to oral Protonix.  He has also been on IV Rocephin during the course of his stay and his diet was advanced and he is now tolerating.  He was seen by GI with recommendations to follow-up outpatient and continue on medications as noted above.  No other acute events or concerns noted and he is stable for discharge.  Discharge Diagnoses:  Principal Problem:   Upper GI bleed Active Problems:   Hyponatremia   Alcoholic cirrhosis of liver with ascites (HCC)   Prolonged QT interval   Hypertension, essential   Anxiety and depression   Elevated LFTs   Alcohol  abuse   DM (diabetes mellitus) (HCC)   Symptomatic anemia   ABLA (acute blood loss anemia)   Secondary esophageal varices with bleeding (HCC)   Encephalopathy, hepatic (HCC)  Principal discharge diagnosis: Acute blood loss anemia likely secondary to upper GI bleed in the presence of esophageal varices.  Status post EGD with variceal banding 2/22.  Discharge Instructions  Discharge Instructions     Diet - low sodium heart healthy   Complete by: As directed    Increase activity slowly   Complete by: As directed       Allergies as of 08/29/2022       Reactions   Neosporin + Pain Relief Max St [neomy-bacit-polymyx-pramoxine] Rash        Medication List     STOP taking these medications    omeprazole 20 MG tablet Commonly known as: PRILOSEC OTC   prednisoLONE 15 MG/5ML Soln Commonly known as: PRELONE       TAKE these medications    acetaminophen 325 MG tablet Commonly known as: TYLENOL Take 650 mg by mouth as needed for mild pain, headache or moderate pain.   albuterol 108 (90 Base) MCG/ACT inhaler Commonly known as: VENTOLIN HFA INHALE 2 PUFFS INTO THE LUNGS EVERY 6 HOURS AS NEEDED FOR WHEEZING OR SHORTNESS OF BREATH   Belsomra 15 MG Tabs Generic drug: Suvorexant Take 1 tablet by mouth daily.   carvedilol 3.125 MG tablet Commonly known as: COREG Take 1 tablet (3.125 mg total) by mouth 2 (two) times daily with a meal.   cefdinir 300 MG capsule Commonly known as: OMNICEF Take 1 capsule (300 mg total)  by mouth 2 (two) times daily for 2 days.   Dexcom G7 Sensor Misc Inject 1 application. into the skin as directed. Change sensor every 10 days as directed.   FLUoxetine 10 MG capsule Commonly known as: PROZAC TAKE 4 CAPSULES BY MOUTH EVERY DAY What changed: See the new instructions.   furosemide 40 MG tablet Commonly known as: Lasix Take 1 tablet (40 mg total) by mouth daily.   glucose blood test strip Use as instructed to monitor glucose 4 times  daily   insulin lispro 100 UNIT/ML KwikPen Commonly known as: HumaLOG KwikPen Inject 8-14 Units into the skin 3 (three) times daily. What changed: additional instructions   lactulose 10 GM/15ML solution Commonly known as: CHRONULAC Take 15 mLs (10 g total) by mouth 2 (two) times daily.   Lantus SoloStar 100 UNIT/ML Solostar Pen Generic drug: insulin glargine Inject 40 Units into the skin at bedtime.   meclizine 12.5 MG tablet Commonly known as: ANTIVERT Take 12.5 mg by mouth 3 (three) times daily as needed for dizziness or nausea.   metFORMIN 500 MG 24 hr tablet Commonly known as: GLUCOPHAGE-XR Take 2 tablets (1,000 mg total) by mouth 2 (two) times daily with a meal.   montelukast 10 MG tablet Commonly known as: SINGULAIR TAKE 1 TABLET(10 MG) BY MOUTH AT BEDTIME   multivitamin tablet Take 1 tablet by mouth daily.   ondansetron 4 MG disintegrating tablet Commonly known as: ZOFRAN-ODT Take 4 mg by mouth 2 (two) times daily. FOR 14 DAYS START ON 08/20/22   OVER THE COUNTER MEDICATION Take 1 tablet by mouth daily. Melatonin   pantoprazole 40 MG tablet Commonly known as: Protonix Take 1 tablet (40 mg total) by mouth 2 (two) times daily.   Pen Needles 32G X 4 MM Misc 1 each by Does not apply route in the morning, at noon, in the evening, and at bedtime. Use to inject insulin 4 times daily   spironolactone 100 MG tablet Commonly known as: ALDACTONE Take 1 tablet (100 mg total) by mouth daily.   sucralfate 1 GM/10ML suspension Commonly known as: CARAFATE Take 10 mLs (1 g total) by mouth 4 (four) times daily -  with meals and at bedtime for 14 days.   thiamine 100 MG tablet Commonly known as: Vitamin B-1 Take 1 tablet (100 mg total) by mouth daily.        Follow-up Information     Celene Squibb, MD. Schedule an appointment as soon as possible for a visit in 1 week(s).   Specialty: Internal Medicine Contact information: [place]  [address] [phone]         [place]. Go to.   Contact information: [place] [address] [phone]               Allergies  Allergen Reactions   Neosporin + Pain Relief Max St [Neomy-Bacit-Polymyx-Pramoxine] Rash    Consultations: GI   Procedures/Studies: MR LIVER W WO CONTRAST  Result Date: 08/03/2022 CLINICAL DATA:  Cirrhosis.  [place]. EXAM: MRI ABDOMEN WITHOUT AND WITH CONTRAST TECHNIQUE: Multiplanar multisequence MR imaging of the abdomen was performed both before and after the administration of intravenous contrast. CONTRAST:  31m GADAVIST GADOBUTROL 1 MMOL/ML IV SOLN COMPARISON:  CT 08/02/2022 and older. FINDINGS: Lower chest: Trace pleural fluid. Breathing motion along several series. Hepatobiliary: There is a micronodular liver. Overall liver is slightly enlarged. The liver is without areas of restricted diffusion. No hypervascular lesions with  washout. The tiny areas of low-density on the prior CT scan are again seen as small subcentimeter foci which are dark on T2 foci which do not clearly show abnormal enhancement and may be multiple regenerative nodules. Main portal vein has a diameter of 13 mm of the liver hilum. No biliary ductal dilatation. Gallbladder is nondilated. Gallstones Pancreas: Mild pancreatic atrophy. No pancreatic duct dilatation. No enhancing pancreatic mass. Spleen: Spleen has preserved enhancement. Spleen is enlarged at 14.4 cm in AP length. Adrenals/Urinary Tract: Adrenal glands are preserved. No enhancing renal mass or collecting system dilatation. Stomach/Bowel: Stomach is nondilated. Mild gastric fold thickening. The visualized bowel is nondilated. Normal retrocecal appendix. Vascular/Lymphatic: Normal caliber aorta and IVC. There are scattered varices identified including along the lower esophagus, upper stomach. There are several prominent lymph nodes identified in the upper abdomen  including periceliac. Example series 14, image 42 measuring 2.3 x 0.8 cm. Portacaval node series 14, image 43 measuring 20 by 10 mm. These are nonspecific. Other:  Anasarca.  Scattered mesenteric haziness and ascites. Musculoskeletal: Focal L3 spinal hemangioma. IMPRESSION: Evidence of chronic liver disease. Micronodular liver with multiple presumed regenerative nodules. No clear aggressive appearing lesions with restricted diffusion, hypervascularity or washout at this time. Mild ascites. Varices including along the stomach and lower esophagus. Mild splenic enlargement. The portal vein is patent. Nonspecific upper abdominal nodes. Electronically Signed   By: Jill Side M.D.   On: 08/03/2022 18:40   ECHOCARDIOGRAM COMPLETE  Result Date: 08/03/2022    ECHOCARDIOGRAM REPORT   Patient Name:   Shawn Velazquez Date of Exam: 08/03/2022 Medical Rec #:  [id number]         Height:       66.0 in Accession #:    [id number]        Weight:       199.3 lb Date of Birth:  [birth year]        BSA:          1.997 m Patient Age:    40 years          BP:           137/77 mmHg Patient Gender: M                 HR:           74 bpm. Exam Location:  [name] Procedure: 2D Echo, 3D Echo, Cardiac Doppler, Color Doppler and Strain Analysis Indications:    Dyspnea R06.00  History:        Patient has no prior history of Echocardiogram examinations.                 Signs/Symptoms:Dyspnea and Edema; Risk Factors:Hypertension,                 Diabetes, Dyslipidemia, Former Smoker and Sleep Apnea.  Sonographer:    Greer Pickerel Referring Phys: [phone] DAVID TAT  Sonographer Comments: Image acquisition challenging due to patient body habitus and Image acquisition challenging due to respiratory motion. Global longitudinal strain was attempted. IMPRESSIONS  1. Left ventricular ejection fraction, by estimation, is 60 to 65%. The left ventricle has normal function. The left ventricle has no regional wall motion abnormalities. There is mild left  ventricular hypertrophy. Left ventricular diastolic parameters were normal. The average left ventricular global longitudinal strain is -21.1 %. The global longitudinal strain is normal.  2. Right ventricular systolic function was not well visualized. The right ventricular size is normal.  3. Left atrial size  was moderately dilated.  4. The mitral valve is grossly normal. No evidence of mitral valve regurgitation. No evidence of mitral stenosis.  5. The aortic valve was not well visualized. There is moderate calcification of the aortic valve. Aortic valve regurgitation is not visualized. No aortic stenosis is present. Comparison(s): No prior Echocardiogram. FINDINGS  Left Ventricle: Left ventricular ejection fraction, by estimation, is 60 to 65%. The left ventricle has normal function. The left ventricle has no regional wall motion abnormalities. The average left ventricular global longitudinal strain is -21.1 %. The global longitudinal strain is normal. The left ventricular internal cavity size was normal in size. There is mild left ventricular hypertrophy. Left ventricular diastolic parameters were normal. Right Ventricle: The right ventricular size is normal. No increase in right ventricular wall thickness. Right ventricular systolic function was not well visualized. Left Atrium: Left atrial size was moderately dilated. Right Atrium: Right atrial size was normal in size. Pericardium: There is no evidence of pericardial effusion. Mitral Valve: The mitral valve is grossly normal. No evidence of mitral valve regurgitation. No evidence of mitral valve stenosis. Tricuspid Valve: The tricuspid valve is grossly normal. Tricuspid valve regurgitation is not demonstrated. No evidence of tricuspid stenosis. Aortic Valve: The aortic valve was not well visualized. There is moderate calcification of the aortic valve. Aortic valve regurgitation is not visualized. No aortic stenosis is present. Pulmonic Valve: The pulmonic valve  was not well visualized. Pulmonic valve regurgitation is not visualized. No evidence of pulmonic stenosis. Aorta: The aortic root and ascending aorta are structurally normal, with no evidence of dilitation. Venous: The inferior vena cava was not well visualized. IAS/Shunts: The interatrial septum was not well visualized.  LEFT VENTRICLE PLAX 2D LVIDd:         4.60 cm   Diastology LVIDs:         2.90 cm   LV e' medial:    7.77 cm/s LV PW:         1.20 cm   LV E/e' medial:  12.5 LV IVS:        0.90 cm   LV e' lateral:   11.70 cm/s LVOT diam:     2.10 cm   LV E/e' lateral: 8.3 LV SV:         70 LV SV Index:   35        2D Longitudinal Strain LVOT Area:     3.46 cm  2D Strain GLS Avg:     -21.1 %                           3D Volume EF:                          3D EF:        56 %                          LV EDV:       221 ml                          LV ESV:       97 ml                          LV SV:        124 ml RIGHT  VENTRICLE RV S prime:     13.50 cm/s TAPSE (M-mode): 1.8 cm LEFT ATRIUM             Index        RIGHT ATRIUM           Index LA diam:        3.80 cm 1.90 cm/m   RA Area:     12.50 cm LA Vol (A2C):   74.4 ml 37.26 ml/m  RA Volume:   21.10 ml  10.57 ml/m LA Vol (A4C):   78.7 ml 39.41 ml/m LA Biplane Vol: 81.5 ml 40.82 ml/m  AORTIC VALVE LVOT Vmax:   106.00 cm/s LVOT Vmean:  68.400 cm/s LVOT VTI:    0.203 m  AORTA Ao Root diam: 3.40 cm Ao Asc diam:  3.00 cm MITRAL VALVE               TRICUSPID VALVE MV Area (PHT): 4.31 cm    TR Peak grad:   4.7 mmHg MV Decel Time: 176 msec    TR Vmax:        108.00 cm/s MV E velocity: 97.40 cm/s MV A velocity: 81.50 cm/s  SHUNTS MV E/A ratio:  1.20        Systemic VTI:  0.20 m                            Systemic Diam: 2.10 cm Vishnu Priya Mallipeddi Electronically signed by Lorelee Cover Mallipeddi Signature Date/Time: 08/03/2022/1:10:55 PM    Final    US Paracentesis  Result Date: 08/02/2022 INDICATION: Ascites EXAM: ULTRASOUND GUIDED DIAGNOSTIC AND THERAPEUTIC  PARACENTESIS MEDICATIONS: None. COMPLICATIONS: None immediate. PROCEDURE: Informed written consent was obtained from the patient after a discussion of the risks, benefits and alternatives to treatment. A timeout was performed prior to the initiation of the procedure. Initial ultrasound scanning demonstrates a large amount of ascites within the right lower abdominal quadrant. The right lower abdomen was prepped and draped in the usual sterile fashion. 1% lidocaine was used for local anesthesia. Following this, a 19 gauge, 7-cm, Yueh catheter was introduced. An ultrasound image was saved for documentation purposes. The paracentesis was performed. The catheter was removed and a dressing was applied. The patient tolerated the procedure well without immediate post procedural complication. Patient received post-procedure intravenous albumin; see nursing notes for details. FINDINGS: A total of approximately 3.5 L of yellow ascitic fluid was removed. Samples were sent to the laboratory as requested by the clinical team. IMPRESSION: Successful ultrasound-guided paracentesis yielding 3.5 liters of peritoneal fluid. Electronically Signed   By: Lavonia Dana M.D.   On: 08/02/2022 16:52   DG Chest Portable 1 View  Result Date: 08/02/2022 CLINICAL DATA:  Shortness of breath EXAM: PORTABLE CHEST 1 VIEW COMPARISON:  09/11/2017 FINDINGS: The heart size and mediastinal contours are within normal limits. Mild diffuse interstitial opacity. The visualized skeletal structures are unremarkable. IMPRESSION: Mild diffuse bilateral interstitial pulmonary opacity, suggesting edema or atypical/viral infection. No focal airspace opacity. Electronically Signed   By: Delanna Ahmadi M.D.   On: 08/02/2022 14:27   CT ABDOMEN PELVIS W CONTRAST  Result Date: 08/02/2022 CLINICAL DATA:  Abdominal pain, weakness, leg swelling and abdominal swelling for 1 week. EXAM: CT ABDOMEN AND PELVIS WITH CONTRAST TECHNIQUE: Multidetector CT imaging of the  abdomen and pelvis was performed using the standard protocol following bolus administration of intravenous contrast. RADIATION DOSE REDUCTION: This exam was performed according to  the departmental dose-optimization program which includes automated exposure control, adjustment of the mA and/or kV according to patient size and/or use of iterative reconstruction technique. CONTRAST:  141m OMNIPAQUE IOHEXOL 300 MG/ML  SOLN COMPARISON:  02/21/2014 FINDINGS: Lower chest: No acute abnormality. No pleural fluid or airspace consolidation. Hepatobiliary: There is hypertrophy of the lateral segment of left hepatic lobe and caudate lobe of liver. The contour the liver appears irregular and nodular. Heterogeneous appearance of the liver parenchyma with numerous tiny low-density foci are scattered throughout which measure up to 7 mm. These are new when compared with 02/21/2014. stones identified within the gallbladder. No significant gallbladder wall thickening or bile duct dilatation. Pancreas: Unremarkable. No pancreatic ductal dilatation or surrounding inflammatory changes. Spleen: Normal in size without focal abnormality. Adrenals/Urinary Tract: Adrenal glands are unremarkable. Kidneys are normal, without renal calculi, focal lesion, or hydronephrosis. Bladder is unremarkable. Stomach/Bowel: Stomach appears normal. No small bowel wall thickening, inflammation or distension. The appendix is visualized and is unremarkable. There is mild edema involving the colon from the cecum to the level of the splenic flexure Vascular/Lymphatic: Aortic atherosclerosis. The portal vein appears patent. Esophageal and gastric varices identified. Recanalization of the umbilical vein. No abdominopelvic adenopathy. Reproductive: Prostate is unremarkable. Other: There is a moderate volume of ascites within the abdomen and pelvis. No focal fluid collections identified. Right inguinal hernia contains fat as well as ascites. Musculoskeletal:  Subcutaneous edema is identified along both flanks as well as the proximal portions of the lower extremities. No acute or suspicious osseous findings. IMPRESSION: 1. Morphologic features of the liver compatible with cirrhosis. 2. Stigmata of portal venous hypertension identified including moderate volume of ascites, esophageal and gastric varices and recanalization of the umbilical vein. 3. Heterogeneous appearance of the liver parenchyma with numerous tiny low-density foci scattered throughout which measure up to 7 mm. These are new when compared with 02/21/2014. These are nonspecific and may reflect multiple dysplastic nodules. Consider follow-up imaging with nonemergent MRI of the liver without and with contrast material for further characterization 4. Gallstones. 5. Mild edema involving the colon from the cecum to the level of the splenic flexure. This is nonspecific and may be related to underlying liver disease. 6. Right inguinal hernia contains fat as well as ascites. 7. Subcutaneous edema is identified along both flanks as well as the proximal portions of the lower extremities. 8.  Aortic Atherosclerosis (ICD10-I70.0). Electronically Signed   By: TKerby MoorsM.D.   On: 08/02/2022 13:38     Discharge Exam: Vitals:   08/28/22 2234 08/29/22 0409  BP:  116/79  Pulse:  70  Resp:  19  Temp:  98.5 F (36.9 C)  SpO2: 95% 97%   Vitals:   08/28/22 0857 08/28/22 1929 08/28/22 2234 08/29/22 0409  BP: 132/78 110/66  116/79  Pulse: 82 73  70  Resp:  18  19  Temp:  98.3 F (36.8 C)  98.5 F (36.9 C)  TempSrc:  Oral  Oral  SpO2:  98% 95% 97%  Weight:      Height:        General: Pt is alert, awake, not in acute distress Cardiovascular: RRR, S1/S2 +, no rubs, no gallops Respiratory: CTA bilaterally, no wheezing, no rhonchi Abdominal: Soft, NT, ND, bowel sounds + Extremities: no edema, no cyanosis    The results of significant diagnostics from this hospitalization (including imaging,  microbiology, ancillary and laboratory) are listed below for reference.     Microbiology: Recent Results (from the past  240 hour(s))  MRSA Next Gen by PCR, Nasal     Status: None   Collection Time: 08/26/22  4:15 AM   Specimen: Nasal Mucosa; Nasal Swab  Result Value Ref Range Status   MRSA by PCR Next Gen NOT DETECTED NOT DETECTED Final    Comment: (NOTE) The GeneXpert MRSA Assay (FDA approved for NASAL specimens only), is one component of a comprehensive MRSA colonization surveillance program. It is not intended to diagnose MRSA infection nor to guide or monitor treatment for MRSA infections. Test performance is not FDA approved in patients less than 23 years old. Performed at [facility], [address] [place], [place] [address]      Labs: BNP (last 3 results) Recent Labs    08/02/22 1224  BNP [id number]   Basic Metabolic Panel: Recent Labs  Lab 08/25/22 1558 08/26/22 0810 08/27/22 0501 08/28/22 0251 08/29/22 0446  NA 119* 129* 128* 128* 128*  K 5.0 3.6 3.6 3.6 3.9  CL 81* 92* 96* 97* 100  CO2 '27 28 25 25 23  '$ GLUCOSE 372* 75 173* 150* 205*  BUN 25* 24* '18 12 9  '$ CREATININE 1.17 1.00 1.01 1.12 0.86  CALCIUM 7.9* 7.7* 7.8* 7.6* 7.5*  MG 1.8 2.0 2.1 1.9 1.6*   Liver Function Tests: Recent Labs  Lab 08/25/22 1558 08/26/22 0810 08/27/22 0501 08/28/22 0251 08/29/22 0446  AST 126* 97* 149* 136* 84*  ALT 74* 61* 78* 72* 59*  ALKPHOS 149* 116 130* 166* 167*  BILITOT 3.2* 4.0* 3.2* 2.3* 2.0*  PROT 5.8* 5.0* 5.4* 5.1* 4.9*  ALBUMIN 2.7* 2.3* 2.5* 2.3* 2.1*   Recent Labs  Lab 08/25/22 1558  LIPASE 99*   No results for input(s): "AMMONIA" in the last 168 hours. CBC: Recent Labs  Lab 08/26/22 1458 08/26/22 2117 08/27/22 0501 08/28/22 0251 08/29/22 0446  WBC 11.0* 10.7* 11.9* 12.7* 9.4  HGB 9.5* 10.1* 10.5* 9.7* 9.4*  HCT 26.8* 28.4* 28.3* 26.1* 26.2*  MCV 98.9 97.3 99.6 102.4* 102.3*  PLT 77* 79* 81* 83* 89*   Cardiac Enzymes: No results for input(s):  "CKTOTAL", "CKMB", "CKMBINDEX", "TROPONINI" in the last 168 hours. BNP: Invalid input(s): "POCBNP" CBG: Recent Labs  Lab 08/28/22 1932 08/28/22 2116 08/28/22 2356 08/29/22 0409 08/29/22 0835  GLUCAP 292* 215* 260* 204* 155*   D-Dimer No results for input(s): "DDIMER" in the last 72 hours. Hgb A1c No results for input(s): "HGBA1C" in the last 72 hours. Lipid Profile No results for input(s): "CHOL", "HDL", "LDLCALC", "TRIG", "CHOLHDL", "LDLDIRECT" in the last 72 hours. Thyroid function studies No results for input(s): "TSH", "T4TOTAL", "T3FREE", "THYROIDAB" in the last 72 hours.  Invalid input(s): "FREET3" Anemia work up No results for input(s): "VITAMINB12", "FOLATE", "FERRITIN", "TIBC", "IRON", "RETICCTPCT" in the last 72 hours. Urinalysis    Component Value Date/Time   COLORURINE YELLOW 08/02/2022 1304   APPEARANCEUR CLEAR 08/02/2022 1304   LABSPEC 1.004 (L) 08/02/2022 1304   PHURINE 7.0 08/02/2022 1304   GLUCOSEU NEGATIVE 08/02/2022 1304   HGBUR NEGATIVE 08/02/2022 1304   BILIRUBINUR NEGATIVE 08/02/2022 1304   BILIRUBINUR negative 07/01/2020 1608   KETONESUR NEGATIVE 08/02/2022 1304   PROTEINUR NEGATIVE 08/02/2022 1304   UROBILINOGEN 0.2 07/01/2020 1608   NITRITE NEGATIVE 08/02/2022 1304   LEUKOCYTESUR NEGATIVE 08/02/2022 1304   Sepsis Labs Recent Labs  Lab 08/26/22 2117 08/27/22 0501 08/28/22 0251 08/29/22 0446  WBC 10.7* 11.9* 12.7* 9.4   Microbiology Recent Results (from the past 240 hour(s))  MRSA Next Gen by PCR, Nasal  Status: None   Collection Time: 08/26/22  4:15 AM   Specimen: Nasal Mucosa; Nasal Swab  Result Value Ref Range Status   MRSA by PCR Next Gen NOT DETECTED NOT DETECTED Final    Comment: (NOTE) The GeneXpert MRSA Assay (FDA approved for NASAL specimens only), is one component of a comprehensive MRSA colonization surveillance program. It is not intended to diagnose MRSA infection nor to guide or monitor treatment for MRSA  infections. Test performance is not FDA approved in patients less than 3 years old. Performed at [facility], [address], [place], [place] [address]      Time coordinating discharge: 35 minutes  SIGNED:   Rodena Goldmann, DO [facility] 08/29/2022, 10:12 AM  If 7PM-7AM, please contact night-coverage [email or web address]

## 2022-08-29 NOTE — Progress Notes (Signed)
Shawn Velazquez, M.D. Gastroenterology & Hepatology   Interval History:  No acute events overnight. Patient had 1 small bowel movement yesterday.  Denies any nausea, vomiting, fever, chills, abdominal distention or abdominal pain, no melena or hematochezia.  Notably he is feeling some dysphagia when he swallows food but still able to tolerate diet. Labs today showed stable hemoglobin of 9.4.  Inpatient Medications:  Current Facility-Administered Medications:    carvedilol (COREG) tablet 3.125 mg, 3.125 mg, Oral, BID WC, Shah, Pratik D, DO, 3.125 mg at 08/29/22 [id number]   cefTRIAXone (ROCEPHIN) 2 g in sodium chloride 0.9 % 100 mL IVPB, 2 g, Intravenous, Q24H, Manuella Ghazi, Pratik D, DO   Chlorhexidine Gluconate Cloth 2 % PADS 6 each, 6 each, Topical, Daily, Emokpae, Ejiroghene E, MD, 6 each at 08/29/22 0838   FLUoxetine (PROZAC) capsule 20 mg, 20 mg, Oral, BID, Manuella Ghazi, Pratik D, DO, 20 mg at [id number]   folic acid (FOLVITE) tablet 1 mg, 1 mg, Oral, Daily, Emokpae, Ejiroghene E, MD, 1 mg at 08/29/22 [id number]   furosemide (LASIX) tablet 40 mg, 40 mg, Oral, Daily, Manuella Ghazi, Pratik D, DO, 40 mg at 08/29/22 [id number]   insulin aspart (novoLOG) injection 0-15 Units, 0-15 Units, Subcutaneous, TID WC, Shah, Pratik D, DO, 3 Units at 08/29/22 [id number]   insulin aspart (novoLOG) injection 0-5 Units, 0-5 Units, Subcutaneous, QHS, Shah, Pratik D, DO, 2 Units at 08/28/22 2256   insulin glargine-yfgn (SEMGLEE) injection 15 Units, 15 Units, Subcutaneous, QHS, Shah, Pratik D, DO, 15 Units at 08/28/22 2255   lactulose (CHRONULAC) 10 GM/15ML solution 10 g, 10 g, Oral, BID, Mahon, Courtney L, NP, 10 g at 08/29/22 [id number]   meclizine (ANTIVERT) tablet 12.5 mg, 12.5 mg, Oral, TID PRN, Manuella Ghazi, Pratik D, DO   multivitamin with minerals tablet 1 tablet, 1 tablet, Oral, Daily, Emokpae, Ejiroghene E, MD, 1 tablet at 08/29/22 0838   pantoprazole (PROTONIX) injection 40 mg, 40 mg, Intravenous, Q12H, Emokpae, Ejiroghene E, MD, 40 mg at 08/29/22 0535    polyethylene glycol (MIRALAX / GLYCOLAX) packet 17 g, 17 g, Oral, Daily PRN, Emokpae, Ejiroghene E, MD   spironolactone (ALDACTONE) tablet 100 mg, 100 mg, Oral, Daily, Manuella Ghazi, Pratik D, DO, 100 mg at 08/29/22 0838   sucralfate (CARAFATE) 1 GM/10ML suspension 1 g, 1 g, Oral, TID WC & HS, Shah, Pratik D, DO, 1 g at 08/29/22 [id number]   thiamine (VITAMIN B1) tablet 100 mg, 100 mg, Oral, Daily, 100 mg at 08/29/22 0839 **OR** thiamine (VITAMIN B1) injection 100 mg, 100 mg, Intravenous, Daily, Emokpae, Ejiroghene E, MD   I/O    Intake/Output Summary (Last 24 hours) at 08/29/2022 1042 Last data filed at 08/29/2022 0223 Gross per 24 hour  Intake 196.55 ml  Output --  Net 196.55 ml     Physical Exam: Temp:  [98.3 F (36.8 C)-98.5 F (36.9 C)] 98.5 F (36.9 C) (02/25 0409) Pulse Rate:  [70-73] 70 (02/25 0409) Resp:  [18-19] 19 (02/25 0409) BP: (110-116)/(66-79) 116/79 (02/25 0409) SpO2:  [95 %-98 %] 97 % (02/25 0409) FiO2 (%):  [21 %] 21 % (02/24 2234)  Temp (24hrs), Avg:98.4 F (36.9 C), Min:98.3 F (36.8 C), Max:98.5 F (36.9 C) GENERAL: The patient is AO x3, in no acute distress. HEENT: Head is normocephalic and atraumatic. EOMI are intact. Mouth is well hydrated and without lesions. NECK: Supple. No masses LUNGS: Clear to auscultation. No presence of rhonchi/wheezing/rales. Adequate chest expansion HEART: RRR, normal s1 and s2. ABDOMEN: Soft, nontender, no guarding, no peritoneal signs, and  nondistended. BS +. No masses. EXTREMITIES: Without any cyanosis, clubbing, rash, lesions.  Has +1 edema in both lower extremities up to his knees. NEUROLOGIC: AOx3, no focal motor deficit. SKIN: no jaundice, no rashes  Laboratory Data: CBC:     Component Value Date/Time   WBC 9.4 08/29/2022 0446   RBC 2.56 (L) 08/29/2022 0446   HGB 9.4 (L) 08/29/2022 0446   HGB 14.4 07/31/2021 1204   HCT 26.2 (L) 08/29/2022 0446   HCT 42.4 07/31/2021 1204   PLT 89 (L) 08/29/2022 0446   PLT 146 (L) 07/31/2021  1204   MCV 102.3 (H) 08/29/2022 0446   MCV 97 07/31/2021 1204   MCH 36.7 (H) 08/29/2022 0446   MCHC 35.9 08/29/2022 0446   RDW 17.6 (H) 08/29/2022 0446   RDW 11.8 07/31/2021 1204   LYMPHSABS 1.1 08/18/2022 0958   LYMPHSABS 1.6 07/31/2021 1204   MONOABS 1.0 08/18/2022 0958   EOSABS 0.3 08/18/2022 0958   EOSABS 0.2 07/31/2021 1204   BASOSABS 0.0 08/18/2022 0958   BASOSABS 0.1 07/31/2021 1204   COAG:  Lab Results  Component Value Date   INR 1.7 (H) 08/29/2022   INR 1.6 (H) 08/28/2022   INR 1.5 (H) 08/27/2022    BMP:     Latest Ref Rng & Units 08/29/2022    4:46 AM 08/28/2022    2:51 AM 08/27/2022    5:01 AM  BMP  Glucose 70 - 99 mg/dL 205  150  173   BUN 6 - 20 mg/dL '9  12  18   '$ Creatinine 0.61 - 1.24 mg/dL 0.86  1.12  1.01   Sodium 135 - 145 mmol/L 128  128  128   Potassium 3.5 - 5.1 mmol/L 3.9  3.6  3.6   Chloride 98 - 111 mmol/L 100  97  96   CO2 22 - 32 mmol/L '23  25  25   '$ Calcium 8.9 - 10.3 mg/dL 7.5  7.6  7.8     HEPATIC:     Latest Ref Rng & Units 08/29/2022    4:46 AM 08/28/2022    2:51 AM 08/27/2022    5:01 AM  Hepatic Function  Total Protein 6.5 - 8.1 g/dL 4.9  5.1  5.4   Albumin 3.5 - 5.0 g/dL 2.1  2.3  2.5   AST 15 - 41 U/L 84  136  149   ALT 0 - 44 U/L 59  72  78   Alk Phosphatase 38 - 126 U/L 167  166  130   Total Bilirubin 0.3 - 1.2 mg/dL 2.0  2.3  3.2     CARDIAC:  Lab Results  Component Value Date   CKTOTAL 185 03/06/2011   CKMB 4.1 (H) 03/06/2011   TROPONINI <0.30 04/20/2012      Imaging: I personally reviewed and interpreted the available labs, imaging and endoscopic files.   Assessment/Plan: 57 year old male with past medical history of alcoholic liver cirrhosis complicated by bleeding esophageal varices and ascites, sleep apnea, diabetes, hypertension, who came to the hospital after presenting worsening hematemesis and melena.  Patient had significant drop in his hemoglobin for which he underwent an emergent EGD.  Was found to have grade 2  esophageal varices which were banded x 3, there was a single AVM in his duodenum but no other abnormalities were found.After his procedure he has remained hemodynamically stable and without any clinical evidence of recurrent bleeding.  Hemoglobin has been stable after his procedure.  Patient should continue on PPI  twice daily upon discharge.  He will need to finish a 7-day course of third-generation cephalosporin for SBP prophylaxis given recent bleeding.  He will need a repeat EGD in 4 to 8 weeks -will arrange for this as outpatient.  Was started on Coreg and has tolerated the medication adequately, this should be uptitrated as outpatient.   In terms of his decompensated alcoholic liver disease, he had some mild encephalopathy which is currently being treated with lactulose.  I told the patient that he should titrate this to achieve 2-3 bowel movements per day.  No need to add rifaximin for now.  In terms of his third spacing, he should continue with furosemide 40 mg every day and spironolactone 100 mg every day. MELD 3.0 today - 22.   Alcohol cessation was highly encouraged.   Continue pantoprazole 40 twice daily Carafate suspension 1 g 2 times daily for 2 weeks Furosemide 40 mg qday Spironolactone 100 mg qday Continue daily  H/H and MELD labs, transfuse for hemoglobin less than 7 Continue Rocephin 2 g daily while inpatient, complete total 7 days. Alcohol cessation Continue CIWA Continue lactulose, titrate to achieve 2-3 Bms per day Soft diet Carvedilol 3.125 mg BID Outpatient evaluation at transplant center given high MELD and decompensating events - will be arranged as outpatient. Repeat EGD in 4-8 weeks Patient will follow up in GI clinic with Dr. Abbey Chatters in 1-2 weeks. GI service will sign-off, please call us back if you have any more questions.   Shawn Peppers, MD Gastroenterology and Hepatology [facility] Gastroenterology

## 2022-08-29 NOTE — Telephone Encounter (Signed)
Hi Mitzie,  Can you please schedule a follow up appointment for this patient in 1-2 weeks with Dr. Abbey Chatters or any of the APPs?  Thanks,  Maylon Peppers, MD Gastroenterology and Hepatology [facility] Gastroenterology

## 2022-08-31 ENCOUNTER — Telehealth: Payer: Self-pay | Admitting: *Deleted

## 2022-08-31 ENCOUNTER — Encounter: Payer: Self-pay | Admitting: *Deleted

## 2022-08-31 NOTE — Transitions of Care (Post Inpatient/ED Visit) (Signed)
   08/31/2022  Name: Shawn Velazquez MRN: [id number] DOB: March 13, 1966  Today's TOC FU Call Status: Today's TOC FU Call Status:: Successful TOC FU Call Competed TOC FU Call Complete Date: 08/31/22  Transition Care Management Follow-up Telephone Call Date of Discharge: 08/29/22 Discharge Facility: [facility] ([facility] Type of Discharge: Inpatient Admission Primary Inpatient Discharge Diagnosis:: Upper GI bleeding How have you been since you were released from the hospital?: Same ("I am about the same, moving slow, but doing okay.  I am no longer a patient of [facility]; I am now with my PCP Dr. Delphina Cahill and I have an appointment with Thedore Mins tomorrow") Any questions or concerns?: No  Items Reviewed: Did you receive and understand the discharge instructions provided?: Yes Medications obtained and verified?: No (patient declined; stated no medication questions/ concerns) Medications Not Reviewed Reasons:: Other: (patient declined; discovered at time of successful call, patient is not followed by [facility] PCP) Any new allergies since your discharge?: No Dietary orders reviewed?: No Do you have support at home?: Yes People in Home: significant other Name of Support/Comfort Primary Source: "My girl friend;" patient reports he is essentially independent in self-care activities  [place] and Equipment/Supplies: [place] Ordered?: No Any new equipment or medical supplies ordered?: No  Functional Questionnaire: Do you need assistance with bathing/showering or dressing?: No Do you need assistance with meal preparation?: No Do you need assistance with eating?: No Do you have difficulty maintaining continence: No Do you need assistance with getting out of bed/getting out of a chair/moving?: No Do you have difficulty managing or taking your medications?: No  Folllow up appointments reviewed: PCP Follow-up appointment confirmed?: Yes Date of PCP follow-up appointment?:  09/01/22 Follow-up Provider: PCP, Dr. Delphina Cahill Specialist [facility] Follow-up appointment confirmed?: Yes Date of Specialist follow-up appointment?: 09/07/22 Follow-Up Specialty Provider:: GI provider Do you need transportation to your follow-up appointment?: No Do you understand care options if your condition(s) worsen?: Yes-patient verbalized understanding  SDOH Interventions Today    Flowsheet Row Most Recent Value  SDOH Interventions   Food Insecurity Interventions Intervention Not Indicated  Transportation Interventions Intervention Not Indicated  [significant other provides transportation]      TOC Interventions Today    Flowsheet Row Most Recent Value  TOC Interventions   TOC Interventions Discussed/Reviewed TOC Interventions Discussed      Interventions Today    Flowsheet Row Most Recent Value  Chronic Disease   Chronic disease during today's visit Other  [liver cirrhosis/ chronic GI bleeding]  General Interventions   General Interventions Discussed/Reviewed General Interventions Discussed, Doctor Visits  Doctor Visits Discussed/Reviewed PCP, Specialist, Doctor Visits Discussed  PCP/Specialist Visits Compliance with follow-up visit  Pharmacy Interventions   Pharmacy Dicussed/Reviewed Pharmacy Topics Discussed      Oneta Rack, RN, BSN, CCRN Alumnus RN CM Care Coordination/ Transition of [place] Management [phone]: direct office

## 2022-09-03 ENCOUNTER — Encounter (HOSPITAL_COMMUNITY): Payer: Self-pay | Admitting: Internal Medicine

## 2022-09-06 DIAGNOSIS — F419 Anxiety disorder, unspecified: Secondary | ICD-10-CM | POA: Diagnosis not present

## 2022-09-06 DIAGNOSIS — I10 Essential (primary) hypertension: Secondary | ICD-10-CM | POA: Diagnosis not present

## 2022-09-06 DIAGNOSIS — E1165 Type 2 diabetes mellitus with hyperglycemia: Secondary | ICD-10-CM | POA: Diagnosis not present

## 2022-09-06 DIAGNOSIS — Z125 Encounter for screening for malignant neoplasm of prostate: Secondary | ICD-10-CM | POA: Diagnosis not present

## 2022-09-06 NOTE — Progress Notes (Unsigned)
GI Office Note    Referring Provider: Celene Squibb, MD Primary Care Physician:  Celene Squibb, MD Primary Gastroenterologist: Elon Alas. Abbey Chatters, DO   Date:  09/07/2022  ID:  DEWEL GAVILANES, DOB 06-16-1966, MRN [id number]   Chief Complaint   Chief Complaint  Patient presents with   Hospitalization Follow-up    Patient here today for a hospital follow up from 08/25/2022 due to a Gi bleed. Denies any sight of dark or bloody stools. He has some issues with dizziness, shortness of breath and fatigue. 08/29/2022 hgb was 9.4, patient says he had his labs drawn yesterday at pcp office. I will call for the results. Patient reports he has stools that can range from loose to formed, he says they some time look oily.    History of Present Illness  PRIYAN KIPPEN is a 57 y.o. male with a history of alcoholic liver cirrhosis, ongoing alcohol dependence, sleep apnea, diabetes, HTN presenting today for hospital follow-up of decompensated cirrhosis and upper GI bleed  MRI liver with and without contrast 08/03/2022: -Chronic liver disease -Macronodular liver with multiple presumed regenerative nodules -No clear aggressive appearing lesions with restricted diffusion -Mild ascites -Varices on the stomach and lower esophagus -Mild splenic enlargement  Hospitalized at [facility] 08/26/2022 through 08/29/2022: Presented with hematemesis and melena for 3 days.  Noted to have previous admission at the end of January for anasarca.  He underwent EGD for hematemesis and melena during this hospitalization in February.  Procedure outlined below.  He was treated with octreotide and PPI infusion as well as IV Rocephin for SBP prophylaxis.  He was also treated with Carafate 1 g 4 times daily.  His MELD was 27 on admission with a DF of 45.9.  Was treated with prednisolone during his prior hospitalization in January.  Patient has reported intermittent drowsiness and confusion concerning for possible hepatic encephalopathy  but has been maintained on lactulose.  Given significant bowel movements while inpatient his lactulose was decreased from 3 times daily to twice daily.  Given he was still having alcohol use prednisolone was not started.  Prior to discharge he was advised to continue Lasix 20 mg daily 100 mg spironolactone daily.  His MELD 3.0 was 22 on discharge.  Per Dr. Jenetta Downer, no need for Xifaxan at this time.  Alcohol cessation encouraged. Recommendations on discharge were to continue Carafate for 2 weeks, PPI twice daily, Lasix 40 mg daily, spironolactone 100 mg daily, lactulose 2-3 bowel movements daily, carvedilol 3.125 mg twice daily.  Need to arrange outpatient evaluation at transplant center given elevated levels.  EGD 08/26/2022: -grade 2 esophageal varices s/p banding x 3 with complete eradication, without bleeding -Single nonbleeding angiodysplastic lesion in the duodenum.  -Advised Carafate suspension 1 g before meals and nightly -Repeat EGD in 4-8 weeks.  Today:  Sometimes up in the middle of the night having BM. Sometime solid and sometimes not. Stomach rumbles quite often and at times has pain that makes him feel like he has to go. On average he has 3-4 BM daily. Has had 2 already this morning. Taking the lactulose 14m twice daily. Has trouble with remembering appointments and dates, where he places things. No hallucinations. Is having some dizziness. This has ben off and on since leaving the hospital. Every once in a while maybe once per week he will see a black stool. Finished the Carafate. Has not been taking the pantoprazole. Very little alcohol since discharge. Has had one or 2 (16  oz) cans.   Does use very little salt but does not eat very much at one.   Has some allergies and at times shortness breath and has to use rescue and Singulair to help with the allergies.   Has been having a little abdominal swelling at times. Yesterday he felt very bloated.    Current Outpatient Medications   Medication Sig Dispense Refill   acetaminophen (TYLENOL) 325 MG tablet Take 650 mg by mouth as needed for mild pain, headache or moderate pain.     carvedilol (COREG) 3.125 MG tablet Take 1 tablet (3.125 mg total) by mouth 2 (two) times daily with a meal. 60 tablet 1   Continuous Blood Gluc Sensor (DEXCOM G7 SENSOR) MISC Inject 1 application. into the skin as directed. Change sensor every 10 days as directed. 6 each 3   FLUoxetine (PROZAC) 10 MG capsule TAKE 4 CAPSULES BY MOUTH EVERY DAY (Patient taking differently: Take 10 mg by mouth See admin instructions. TAKE 2 CAPSULES IN THE MORNING AND 2 CAPSULES EVERY EVENING.) 260 capsule 0   furosemide (LASIX) 40 MG tablet Take 1 tablet (40 mg total) by mouth daily. 30 tablet 1   glucose blood test strip Use as instructed to monitor glucose 4 times daily 100 each 12   insulin glargine (LANTUS SOLOSTAR) 100 UNIT/ML Solostar Pen Inject 40 Units into the skin at bedtime. 30 mL 3   insulin lispro (HUMALOG KWIKPEN) 100 UNIT/ML KwikPen Inject 8-14 Units into the skin 3 (three) times daily. (Patient taking differently: Inject 8-14 Units into the skin 3 (three) times daily. PER SLIDING SCALE) 40 mL 3   Insulin Pen Needle (PEN NEEDLES) 32G X 4 MM MISC 1 each by Does not apply route in the morning, at noon, in the evening, and at bedtime. Use to inject insulin 4 times daily 200 each 3   lactulose (CHRONULAC) 10 GM/15ML solution Take 15 mLs (10 g total) by mouth 2 (two) times daily. 236 mL 0   meclizine (ANTIVERT) 12.5 MG tablet Take 12.5 mg by mouth 3 (three) times daily as needed for dizziness or nausea.     metFORMIN (GLUCOPHAGE-XR) 500 MG 24 hr tablet Take 2 tablets (1,000 mg total) by mouth 2 (two) times daily with a meal. 180 tablet 1   Multiple Vitamin (MULTIVITAMIN) tablet Take 1 tablet by mouth daily.     ondansetron (ZOFRAN-ODT) 4 MG disintegrating tablet Take 4 mg by mouth 2 (two) times daily. FOR 14 DAYS START ON 08/20/22     spironolactone (ALDACTONE)  100 MG tablet Take 1 tablet (100 mg total) by mouth daily. 30 tablet 1   sucralfate (CARAFATE) 1 GM/10ML suspension Take 10 mLs (1 g total) by mouth 4 (four) times daily -  with meals and at bedtime for 14 days. 560 mL 0   thiamine (VITAMIN B-1) 100 MG tablet Take 1 tablet (100 mg total) by mouth daily. 30 tablet 1   albuterol (VENTOLIN HFA) 108 (90 Base) MCG/ACT inhaler INHALE 2 PUFFS INTO THE LUNGS EVERY 6 HOURS AS NEEDED FOR WHEEZING OR SHORTNESS OF BREATH (Patient not taking: Reported on 08/25/2022) 20.1 g 0   montelukast (SINGULAIR) 10 MG tablet TAKE 1 TABLET(10 MG) BY MOUTH AT BEDTIME (Patient not taking: Reported on 08/25/2022) 30 tablet 3   pantoprazole (PROTONIX) 40 MG tablet Take 1 tablet (40 mg total) by mouth 2 (two) times daily. (Patient not taking: Reported on 09/07/2022) 60 tablet 0   No current facility-administered medications for this visit.  Past Medical History:  Diagnosis Date   Anxiety    Arthritis    affects hands, shoulder, neck, knees, hips, ankles, toes   Chronic back pain    Diabetes mellitus    diet controlled   Eczema    Fatty liver, alcoholic    GERD (gastroesophageal reflux disease)    Glaucoma    slight case   Hypertension    Psoriasis    Sleep apnea    Substance abuse ([place])    Vertigo 09/11/2017   Wears glasses     Past Surgical History:  Procedure Laterality Date   COLONOSCOPY WITH PROPOFOL N/A 04/05/2022   Procedure: COLONOSCOPY WITH PROPOFOL;  Surgeon: [name], DO;  [facility]: [facility];  Service: Endoscopy;  Laterality: N/A;  10:45 AM,unable to reach pt to move up   [place] 08/26/2022   Procedure: ESOPHAGEAL BANDING;  Surgeon: [name], DO;  Location: [facility];  Service: Endoscopy;  Laterality: N/A;   ESOPHAGOGASTRODUODENOSCOPY (EGD) WITH PROPOFOL N/A 08/26/2022   Procedure: ESOPHAGOGASTRODUODENOSCOPY (EGD) WITH PROPOFOL;  Surgeon: [name], DO;  Location: [facility];  Service: Endoscopy;   Laterality: N/A;   MULTIPLE TOOTH EXTRACTIONS     POLYPECTOMY  04/05/2022   Procedure: POLYPECTOMY;  Surgeon: [name], DO;  Location: [facility];  Service: Endoscopy;;   POSTERIOR CERVICAL LAMINECTOMY  04/21/2012   Procedure: POSTERIOR CERVICAL LAMINECTOMY;  Surgeon: Elaina Hoops, MD;  Location: [place] [facility];  Service: Neurosurgery;  Laterality: Left;  Posterior Cervical laminectomy/foraminotomy and diskectomy, left cervical seven-thoracic one   ROTATOR CUFF REPAIR     THORACIC DISCECTOMY Left 04/03/2014   Procedure: Left Thoracic Seven to Eight, Thoracic Eight to Nine Thoracic Discectomy ;  Surgeon: Elaina Hoops, MD;  Location: [place] [facility];  Service: Neurosurgery;  Laterality: Left;    Family History  Problem Relation Age of Onset   Diabetes Father    Cancer - Lung Father    Hypertension Other    Cancer - Lung Other     Allergies as of 09/07/2022 - Review Complete 09/07/2022  Allergen Reaction Noted   Neosporin + pain relief max st [neomy-bacit-polymyx-pramoxine] Rash 04/19/2012    Social History   Socioeconomic History   Marital status: Divorced    Spouse name: Not on file   Number of children: Not on file   Years of education: Not on file   Highest education level: Not on file  Occupational History    Comment: Truck to storage  Tobacco Use   Smoking status: Every Day    Packs/day: 0.25    Years: 40.00    Total pack years: 10.00    Types: Cigarettes    Last attempt to quit: 05/09/2022    Years since quitting: 0.3   Smokeless tobacco: Never   Tobacco comments:    pt trying to quit on his own  Vaping Use   Vaping Use: Former  Substance and Sexual Activity   Alcohol use: Yes    Alcohol/week: 12.0 standard drinks of alcohol    Types: 12 Cans of beer per week    Comment: Either Smirnoff Smash-4 per day or Fireball-10 airplane bottles in a day   Drug use: No   Sexual activity: Yes    Birth control/protection: None  Other Topics Concern   Not on file   Social History Narrative   Not on file   Social Determinants of Health   Financial Resource Strain: Not on  file  Food Insecurity: No Food Insecurity (08/31/2022)   Hunger Vital Sign    Worried About Running Out of Food in the Last Year: Never true    Ran Out of Food in the Last Year: Never true  Transportation Needs: No Transportation Needs (08/31/2022)   PRAPARE - Hydrologist (Medical): No    Lack of Transportation (Non-Medical): No  Physical Activity: Not on file  Stress: Not on file  Social Connections: Not on file     Review of Systems   Gen: Denies fever, chills, anorexia. Denies fatigue, weakness, weight loss.  CV: Denies chest pain, palpitations, syncope, peripheral edema, and claudication. Resp: Denies dyspnea at rest, cough, wheezing, coughing up blood, and pleurisy. GI: See HPI Derm: Denies rash, itching, dry skin Psych: Denies depression, anxiety, memory loss, confusion. No homicidal or suicidal ideation.  Heme: Denies bruising, bleeding, and enlarged lymph nodes.   Physical Exam   BP 119/69 (BP Location: Left Arm, Patient Position: Sitting, Cuff Size: Large)   Pulse 96   Temp 97.8 F (36.6 C) (Temporal)   Ht '5\' 6"'$  (1.676 m)   Wt 187 lb (84.8 kg)   BMI 30.18 kg/m   General:   Alert and oriented. No distress noted. Pleasant and cooperative.  Head:  Normocephalic and atraumatic. Eyes:  Conjuctiva clear without scleral icterus. Mouth:  Oral mucosa pink and moist. Good dentition. No lesions. Lungs:  Clear to auscultation bilaterally. No wheezes, rales, or rhonchi. No distress.  Heart:  S1, S2 present without murmurs appreciated.  Abdomen:  +BS, soft, non-tender and non-distended. No rebound or guarding. No HSM or masses noted. Rectal: Deferred Msk:  Symmetrical without gross deformities. Normal posture. Extremities: +1 edema to mid shin bilaterally Neurologic:  Alert and  oriented x4 Psych:  Alert and cooperative. Normal mood  and affect.   Assessment  CLIVE DISHAW is a 57 y.o. male with a history of chronic GERD, obesity, diabetes, alcohol use, HTN, sleep apnea presenting today for hospital follow-up of decompensated cirrhosis and upper GI bleed.  Alcoholic cirrhosis: 2 admissions within the last 2 months for anasarca and GI bleeding.  As on his last hospitalization he has reduced his alcohol intake significantly however has had (2)16 ounce beers since discharge.  He remains decompensated at this time given his recent GI bleed.  His MELD on discharge was 22.  We discussed need for transplant evaluation however I did discuss with him and his family were present during his visit today that in order to be considered for transplant in the future that he will need to have complete alcohol cessation.  Patient did report that he has been plugged in with a local community program that he is to start soon to work toward treatment.  He does report that he has used alcohol to cope with insomnia and anxiety therefore we discussed potential referral to psychiatry and for him to discuss with his PCP regarding long-term management of anxiety.  Currently he is maintained on Lasix 40 mg daily and spironolactone 100 mg daily.  He is on carvedilol 3.125 mg twice daily for esophageal variceal prophylaxis.  His BP is tolerating it well.  He currently is also on lactulose for mild hepatic encephalopathy.  Currently is having 4-5 bowel movements daily on twice daily dosing therefore advised him to decrease to once daily.  If he is unable to tolerate lactulose going forward due to significant diarrhea then we may consider adding Xifaxan.  We  discussed cirrhosis diet today provided education to him, see AVS.  Primarily reinforced the need for 2 g sodium diet.  Given his high MELD score previously we will refer to [facility] for transplant evaluation.  Recent upper GI bleed: Treated with Carafate 1 g 4 times daily for 2 weeks as well as PPI  twice daily.  He currently is not taking PPI twice daily for unknown reason.  Advised him to restart this.  EGD as outlined in HPI.  Bleeding likely secondary to portal gastropathy as well as possible duodenal AVM.  His varices were banded x 3.  Will need repeat EGD in 4-8 weeks.  Will plan for mid April.  PLAN   Proceed with upper endoscopy with propofol by Dr. Abbey Chatters in near future: the risks, benefits, and alternatives have been discussed with the patient in detail. The patient states understanding and desires to proceed.  ASA 3 (mid April 2024) Continue Lasix 40 mg daily and spironolactone 100 mg daily 2 g sodium diet Continue carvedilol 3.125 mg twice daily Continue lactulose, titrate for 2-3 bowel movements daily.  May briefly reduce to once daily and if not having 2-3 bowel movements then will increase back to twice daily Continue pantoprazole 40 mg BID.  Continue to work toward alcohol cessation follow with your support group in the community. Discussed treatment for anxiety with PCP.  Discussed with him possible psychiatry referral. Will consider addition of Xifaxan if hepatic encephalopathy worsens or he is unable to tolerate lactulose. Follow-up liver imaging in 6 months. Referral for transplant evaluation -[facility] in [place]. Follow-up in 3 months    Venetia Night, MSN, FNP-BC, AGACNP-BC [facility]

## 2022-09-07 ENCOUNTER — Ambulatory Visit (INDEPENDENT_AMBULATORY_CARE_PROVIDER_SITE_OTHER): Payer: BC Managed Care – PPO | Admitting: Gastroenterology

## 2022-09-07 ENCOUNTER — Encounter: Payer: Self-pay | Admitting: Gastroenterology

## 2022-09-07 VITALS — BP 119/69 | HR 96 | Temp 97.8°F | Ht 66.0 in | Wt 187.0 lb

## 2022-09-07 DIAGNOSIS — F1020 Alcohol dependence, uncomplicated: Secondary | ICD-10-CM

## 2022-09-07 DIAGNOSIS — J209 Acute bronchitis, unspecified: Secondary | ICD-10-CM | POA: Diagnosis not present

## 2022-09-07 DIAGNOSIS — K7011 Alcoholic hepatitis with ascites: Secondary | ICD-10-CM

## 2022-09-07 DIAGNOSIS — K7460 Unspecified cirrhosis of liver: Secondary | ICD-10-CM

## 2022-09-07 DIAGNOSIS — R7989 Other specified abnormal findings of blood chemistry: Secondary | ICD-10-CM

## 2022-09-07 DIAGNOSIS — F419 Anxiety disorder, unspecified: Secondary | ICD-10-CM

## 2022-09-07 MED ORDER — FUROSEMIDE 40 MG PO TABS: 40.0000 mg | ORAL_TABLET | Freq: Every day | ORAL | 1 refills | Status: DC

## 2022-09-07 MED ORDER — ALBUTEROL SULFATE HFA 108 (90 BASE) MCG/ACT IN AERS: 2.0000 | INHALATION_SPRAY | Freq: Four times a day (QID) | RESPIRATORY_TRACT | 0 refills | Status: DC | PRN

## 2022-09-07 MED ORDER — PANTOPRAZOLE SODIUM 40 MG PO TBEC: 40.0000 mg | DELAYED_RELEASE_TABLET | Freq: Two times a day (BID) | ORAL | 0 refills | Status: DC

## 2022-09-07 MED ORDER — MONTELUKAST SODIUM 10 MG PO TABS: ORAL_TABLET | ORAL | 3 refills | Status: DC

## 2022-09-07 NOTE — Patient Instructions (Addendum)
Continue Lasix 40 mg daily and spironolactone 100 mg daily. Continue Coreg 3.125 mg twice daily  Continue taking your lactulose.  Please take 10 g once daily for goal of 2-3 semiformed bowel movements daily.  If you begin only having 1-2 bowel meds per day or you experience any worsening confusion or hallucinations then want you to increase back to twice daily.  Resume taking pantoprazole 40 mg twice daily.  Continue to work toward alcohol cessation.  Congratulations on taking the for step in getting in to a program within the community.  Nutrition:  High-protein diet from a primarily plant-based diet. Avoid red meat.  No raw or undercooked meat, seafood, or shellfish. Low-fat/cholesterol/carbohydrate diet. Limit sodium to no more than 2000 mg/day including everything that you eat and drink. Recommend at least 30 minutes of aerobic and resistance exercise 3 days/week. Limit Tylenol to no more than 2000 mg/day.  Please talk to your PCP regarding your anxiety and potential for psychiatry evaluation.  I have refilled your Singulair as well as your inhaler today.  We are sending a referral to discuss potential for transplant in the future.  This will likely be with [facility] transplant hepatology in [place].  It was a pleasure to see you today. I want to create trusting relationships with patients. If you receive a survey regarding your visit,  I greatly appreciate you taking time to fill this out on paper or through your MyChart. I value your feedback.  Venetia Night, MSN, FNP-BC, AGACNP-BC [facility]

## 2022-09-08 ENCOUNTER — Telehealth (INDEPENDENT_AMBULATORY_CARE_PROVIDER_SITE_OTHER): Payer: Self-pay | Admitting: Internal Medicine

## 2022-09-08 NOTE — Telephone Encounter (Signed)
Attempted to contact pt to schedule EGD ASA 3. Left message to return call.  Pt will need to hold metformin night prior and morning of procedure. 1/2 dose of Lantus night prior.

## 2022-09-08 NOTE — Telephone Encounter (Signed)
Pt left voicemail returning call.   Writer returned call and EGD scheduled for 10/11/22. Instructions will be mailed to patient. Will call pt back with pre op time/date.

## 2022-09-08 NOTE — Progress Notes (Signed)
  Care Coordination   Note   09/08/2022 Name: Shawn Velazquez MRN: [id number] DOB: 01-04-1966  Shawn Velazquez is a 57 y.o. year old male who sees Nevada Crane, Edwinna Areola, MD for primary care. I reached out to Shirlean Mylar by phone today to offer care coordination services.  [name] including:   The Care Coordination services include support from the care team which includes your Nurse Coordinator, Clinical Social Worker, or Pharmacist.  The Care Coordination team is here to help remove barriers to the health concerns and goals most important to you. Care Coordination services are voluntary, and the patient may decline or stop services at any time by request to their care team member.   Care Coordination Consent Status: Patient did not agree to participate in care coordination services at this time.  Per pt has switched primary provider   Follow up plan: none indicated   Encounter Outcome:  Pt. Refused  Julian Hy, [place] Direct Dial: [phone]

## 2022-09-09 ENCOUNTER — Encounter: Payer: Self-pay | Admitting: Nurse Practitioner

## 2022-09-09 ENCOUNTER — Ambulatory Visit (INDEPENDENT_AMBULATORY_CARE_PROVIDER_SITE_OTHER): Payer: BC Managed Care – PPO | Admitting: Nurse Practitioner

## 2022-09-09 VITALS — BP 130/82 | HR 111 | Ht 66.0 in | Wt 187.0 lb

## 2022-09-09 DIAGNOSIS — E1165 Type 2 diabetes mellitus with hyperglycemia: Secondary | ICD-10-CM | POA: Diagnosis not present

## 2022-09-09 DIAGNOSIS — E782 Mixed hyperlipidemia: Secondary | ICD-10-CM | POA: Diagnosis not present

## 2022-09-09 DIAGNOSIS — Z794 Long term (current) use of insulin: Secondary | ICD-10-CM

## 2022-09-09 DIAGNOSIS — I10 Essential (primary) hypertension: Secondary | ICD-10-CM

## 2022-09-09 MED ORDER — INSULIN LISPRO (1 UNIT DIAL) 100 UNIT/ML (KWIKPEN): 10.0000 [IU] | PEN_INJECTOR | Freq: Three times a day (TID) | SUBCUTANEOUS | 3 refills | Status: DC

## 2022-09-09 MED ORDER — LANTUS SOLOSTAR 100 UNIT/ML ~~LOC~~ SOPN: 35.0000 [IU] | PEN_INJECTOR | Freq: Every day | SUBCUTANEOUS | 3 refills | Status: DC

## 2022-09-09 NOTE — Progress Notes (Signed)
Endocrinology Follow Up Note       09/09/2022, 11:59 AM   Subjective:    Patient ID: Shawn Velazquez, male    DOB: [birth year].  Shawn Velazquez is being seen in follow up after being seen in consultation for management of currently uncontrolled symptomatic diabetes requested by  Celene Squibb, MD.   Past Medical History:  Diagnosis Date   Anxiety    Arthritis    affects hands, shoulder, neck, knees, hips, ankles, toes   Chronic back pain    Diabetes mellitus    diet controlled   Eczema    Fatty liver, alcoholic    GERD (gastroesophageal reflux disease)    Glaucoma    slight case   Hypertension    Psoriasis    Sleep apnea    Substance abuse ([place])    Vertigo 09/11/2017   Wears glasses     Past Surgical History:  Procedure Laterality Date   COLONOSCOPY WITH PROPOFOL N/A 04/05/2022   Procedure: COLONOSCOPY WITH PROPOFOL;  Surgeon: [name], DO;  [facility];  Service: Endoscopy;  Laterality: N/A;  10:45 AM,unable to reach pt to move up   [place] 08/26/2022   Procedure: ESOPHAGEAL BANDING;  Surgeon: [name], DO;  [facility];  Service: Endoscopy;  Laterality: N/A;   ESOPHAGOGASTRODUODENOSCOPY (EGD) WITH PROPOFOL N/A 08/26/2022   Procedure: ESOPHAGOGASTRODUODENOSCOPY (EGD) WITH PROPOFOL;  Surgeon: [name], DO;  [facility];  Service: Endoscopy;  Laterality: N/A;   MULTIPLE TOOTH EXTRACTIONS     POLYPECTOMY  04/05/2022   Procedure: POLYPECTOMY;  Surgeon: [name], DO;  [facility];  Service: Endoscopy;;   POSTERIOR CERVICAL LAMINECTOMY  04/21/2012   Procedure: POSTERIOR CERVICAL LAMINECTOMY;  Surgeon: Elaina Hoops, MD;  Location: [facility];  Service: Neurosurgery;  Laterality: Left;  Posterior Cervical laminectomy/foraminotomy and diskectomy, left cervical seven-thoracic one   ROTATOR CUFF REPAIR     THORACIC  DISCECTOMY Left 04/03/2014   Procedure: Left Thoracic Seven to Eight, Thoracic Eight to Nine Thoracic Discectomy ;  Surgeon: Elaina Hoops, MD;  Location: [facility];  Service: Neurosurgery;  Laterality: Left;    Social History   Socioeconomic History   Marital status: Divorced    Spouse name: Not on file   Number of children: Not on file   Years of education: Not on file   Highest education level: Not on file  Occupational History    Comment: Truck to storage  Tobacco Use   Smoking status: Every Day    Packs/day: 0.25    Years: 40.00    Total pack years: 10.00    Types: Cigarettes    Last attempt to quit: 05/09/2022    Years since quitting: 0.3   Smokeless tobacco: Never   Tobacco comments:    pt trying to quit on his own  Vaping Use   Vaping Use: Former  Substance and Sexual Activity   Alcohol use: Yes    Alcohol/week: 12.0 standard drinks of alcohol    Types: 12 Cans of beer per week    Comment: Either Smirnoff Smash-4 per day or Fireball-10  airplane bottles in a day   Drug use: No   Sexual activity: Yes    Birth control/protection: None  Other Topics Concern   Not on file  Social History Narrative   Not on file   Social Determinants of Health   Financial Resource Strain: Not on file  Food Insecurity: No Food Insecurity (08/31/2022)   Hunger Vital Sign    Worried About Running Out of Food in the Last Year: Never true    Ran Out of Food in the Last Year: Never true  Transportation Needs: No Transportation Needs (08/31/2022)   PRAPARE - Hydrologist (Medical): No    Lack of Transportation (Non-Medical): No  Physical Activity: Not on file  Stress: Not on file  Social Connections: Not on file    Family History  Problem Relation Age of Onset   Diabetes Father    Cancer - Lung Father    Hypertension Other    Cancer - Lung Other     Outpatient Encounter Medications as of 09/09/2022  Medication Sig   acetaminophen (TYLENOL) 325 MG  tablet Take 650 mg by mouth as needed for mild pain, headache or moderate pain.   albuterol (VENTOLIN HFA) 108 (90 Base) MCG/ACT inhaler Inhale 2 puffs into the lungs every 6 (six) hours as needed for wheezing or shortness of breath.   carvedilol (COREG) 3.125 MG tablet Take 1 tablet (3.125 mg total) by mouth 2 (two) times daily with a meal.   Continuous Blood Gluc Sensor (DEXCOM G7 SENSOR) MISC Inject 1 application. into the skin as directed. Change sensor every 10 days as directed.   FLUoxetine (PROZAC) 10 MG capsule TAKE 4 CAPSULES BY MOUTH EVERY DAY (Patient taking differently: Take 10 mg by mouth See admin instructions. TAKE 2 CAPSULES IN THE MORNING AND 2 CAPSULES EVERY EVENING.)   furosemide (LASIX) 40 MG tablet Take 1 tablet (40 mg total) by mouth daily.   glucose blood test strip Use as instructed to monitor glucose 4 times daily   insulin glargine (LANTUS SOLOSTAR) 100 UNIT/ML Solostar Pen Inject 35 Units into the skin at bedtime.   insulin lispro (HUMALOG KWIKPEN) 100 UNIT/ML KwikPen Inject 10-16 Units into the skin 3 (three) times daily.   Insulin Pen Needle (PEN NEEDLES) 32G X 4 MM MISC 1 each by Does not apply route in the morning, at noon, in the evening, and at bedtime. Use to inject insulin 4 times daily   lactulose (CHRONULAC) 10 GM/15ML solution Take 15 mLs (10 g total) by mouth 2 (two) times daily.   meclizine (ANTIVERT) 12.5 MG tablet Take 12.5 mg by mouth 3 (three) times daily as needed for dizziness or nausea.   metFORMIN (GLUCOPHAGE-XR) 500 MG 24 hr tablet Take 2 tablets (1,000 mg total) by mouth 2 (two) times daily with a meal.   montelukast (SINGULAIR) 10 MG tablet TAKE 1 TABLET(10 MG) BY MOUTH AT BEDTIME   Multiple Vitamin (MULTIVITAMIN) tablet Take 1 tablet by mouth daily.   pantoprazole (PROTONIX) 40 MG tablet Take 1 tablet (40 mg total) by mouth 2 (two) times daily.   spironolactone (ALDACTONE) 100 MG tablet Take 1 tablet (100 mg total) by mouth daily.   sucralfate  (CARAFATE) 1 GM/10ML suspension Take 10 mLs (1 g total) by mouth 4 (four) times daily -  with meals and at bedtime for 14 days.   thiamine (VITAMIN B-1) 100 MG tablet Take 1 tablet (100 mg total) by mouth daily.   [DISCONTINUED]  insulin glargine (LANTUS SOLOSTAR) 100 UNIT/ML Solostar Pen Inject 40 Units into the skin at bedtime.   [DISCONTINUED] insulin lispro (HUMALOG KWIKPEN) 100 UNIT/ML KwikPen Inject 8-14 Units into the skin 3 (three) times daily. (Patient taking differently: Inject 8-14 Units into the skin 3 (three) times daily. PER SLIDING SCALE)   No facility-administered encounter medications on file as of 09/09/2022.    ALLERGIES: Allergies  Allergen Reactions   Neosporin + Pain Relief Max St [Neomy-Bacit-Polymyx-Pramoxine] Rash    VACCINATION STATUS: Immunization History  Administered Date(s) Administered   Influenza,inj,Quad PF,6+ Mos 07/31/2021   Moderna Sars-Covid-2 Vaccination 01/09/2020, 02/06/2020   Tdap 08/06/2021   Zoster Recombinat (Shingrix) 07/31/2021, 10/29/2021    Diabetes He presents for his follow-up diabetic visit. He has type 2 diabetes mellitus. Onset time: was diagnosed at approx age of 89. His disease course has been worsening. There are no hypoglycemic associated symptoms. Associated symptoms include blurred vision, fatigue, polydipsia and polyuria. Pertinent negatives for diabetes include no weight loss. There are no hypoglycemic complications. Symptoms are improving. Diabetic complications include nephropathy. Risk factors for coronary artery disease include diabetes mellitus, dyslipidemia, hypertension, male sex, obesity and sedentary lifestyle. Current diabetic treatment includes oral agent (monotherapy) and intensive insulin program. He is compliant with treatment most of the time. His weight is fluctuating minimally. He is following a generally unhealthy diet. When asked about meal planning, he reported none. He has not had a previous visit with a dietitian.  He rarely participates in exercise. His home blood glucose trend is fluctuating dramatically. His overall blood glucose range is >200 mg/dl. (He presents today with his CGM and meter showing gross hyperglycemia overall.  His most recent A1c was 6.9% on 3/4, increasing from last visit of 5.1%.  He notes he has been hospitalized on several occasions since last visit for complications of alcohol addiction (upper GI bleed).  He notes he has started an addiction program to help him with his habit.  Analysis of his CGM shows TIR 14%, TAR 86%, TBR 0% with a GMI of 9.3%.) An ACE inhibitor/angiotensin II receptor blocker is being taken. He does not see a podiatrist.Eye exam is current.  Hyperlipidemia This is a chronic problem. The current episode started more than 1 year ago. The problem is controlled. Recent lipid tests were reviewed and are normal. Exacerbating diseases include chronic renal disease, diabetes and obesity. Factors aggravating his hyperlipidemia include thiazides and fatty foods. Current antihyperlipidemic treatment includes statins. The current treatment provides mild improvement of lipids. Compliance problems include adherence to diet, adherence to exercise and psychosocial issues.  Risk factors for coronary artery disease include diabetes mellitus, dyslipidemia, family history, hypertension, male sex, obesity and a sedentary lifestyle.  Hypertension This is a chronic problem. The current episode started more than 1 year ago. The problem is unchanged. The problem is uncontrolled. Associated symptoms include blurred vision. There are no associated agents to hypertension. Risk factors for coronary artery disease include diabetes mellitus, dyslipidemia, family history, male gender, obesity and sedentary lifestyle. Past treatments include diuretics and angiotensin blockers. The current treatment provides mild improvement. Compliance problems include diet, exercise and psychosocial issues.  Hypertensive  end-organ damage includes kidney disease. Identifiable causes of hypertension include chronic renal disease.     Review of systems  Constitutional: + minimally fluctuating body weight,  current Body mass index is 30.18 kg/m. , + fatigue, no subjective hyperthermia, no subjective hypothermia Eyes: + blurry vision no xerophthalmia ENT: no sore throat, no nodules palpated in throat, no  dysphagia/odynophagia, no hoarseness Cardiovascular: no chest pain, no shortness of breath, no palpitations, + leg swelling Respiratory: no cough, no shortness of breath Gastrointestinal: no nausea/vomiting/diarrhea Musculoskeletal: no muscle/joint aches Skin: no rashes, no hyperemia Neurological: no tremors, no numbness, no tingling, no dizziness Psychiatric: no depression, no anxiety  Objective:     BP 130/82   Pulse (!) 111   Ht '5\' 6"'$  (1.676 m)   Wt 187 lb (84.8 kg)   BMI 30.18 kg/m   Wt Readings from Last 3 Encounters:  09/09/22 187 lb (84.8 kg)  09/07/22 187 lb (84.8 kg)  08/27/22 187 lb 2.7 oz (84.9 kg)     BP Readings from Last 3 Encounters:  09/09/22 130/82  09/07/22 119/69  08/29/22 116/79      Physical Exam- Limited  Constitutional:  Body mass index is 30.18 kg/m. , not in acute distress, normal state of mind Eyes:  EOMI, no exophthalmos Musculoskeletal: no gross deformities, strength intact in all four extremities, no gross restriction of joint movements Skin:  no rashes, no hyperemia Neurological: no tremor with outstretched hands  Diabetic Foot Exam - Simple   Simple Foot Form Diabetic Foot exam was performed with the following findings: Yes 09/09/2022 11:58 AM  Visual Inspection See comments: Yes Sensation Testing Intact to touch and monofilament testing bilaterally: Yes Pulse Check Posterior Tibialis and Dorsalis pulse intact bilaterally: Yes Comments 2+ pitting edema to BLE      CMP ( most recent) CMP     Component Value Date/Time   NA 128 (L) 08/29/2022  0446   NA 139 03/09/2022 0809   K 3.9 08/29/2022 0446   CL 100 08/29/2022 0446   CO2 23 08/29/2022 0446   GLUCOSE 205 (H) 08/29/2022 0446   BUN 9 08/29/2022 0446   BUN 5 (L) 03/09/2022 0809   CREATININE 0.86 08/29/2022 0446   CALCIUM 7.5 (L) 08/29/2022 0446   PROT 4.9 (L) 08/29/2022 0446   PROT 7.3 03/09/2022 0809   ALBUMIN 2.1 (L) 08/29/2022 0446   ALBUMIN 4.0 03/09/2022 0809   AST 84 (H) 08/29/2022 0446   ALT 59 (H) 08/29/2022 0446   ALKPHOS 167 (H) 08/29/2022 0446   BILITOT 2.0 (H) 08/29/2022 0446   BILITOT 1.3 (H) 03/09/2022 0809   GFRNONAA >60 08/29/2022 0446   GFRAA 84 07/10/2020 1005     Diabetic Labs (most recent): Lab Results  Component Value Date   HGBA1C 5.1 08/03/2022   HGBA1C 6.4 (A) 05/12/2022   HGBA1C 10.1 02/04/2022     Lipid Panel ( most recent) Lipid Panel     Component Value Date/Time   CHOL 109 11/24/2021 0817   TRIG 95 11/24/2021 0817   HDL 34 (L) 11/24/2021 0817   CHOLHDL 3.2 11/24/2021 0817   LDLCALC 57 11/24/2021 0817   LABVLDL 18 11/24/2021 0817      Lab Results  Component Value Date   TSH 1.256 08/03/2022           Assessment & Plan:   1) Controlled type 2 diabetes mellitus without complications ([place])  He presents today with his CGM and meter showing gross hyperglycemia overall.  His most recent A1c was 6.9% on 3/4, increasing from last visit of 5.1%.  He notes he has been hospitalized on several occasions since last visit for complications of alcohol addiction (upper GI bleed).  He notes he has started an addiction program to help him with his habit.  Analysis of his CGM shows TIR 14%, TAR 86%, TBR 0% with  a GMI of 9.3%.  - Shawn Velazquez has currently uncontrolled symptomatic type 2 DM since 57 years of age.   -Recent labs reviewed.  - I had a long discussion with him about the progressive nature of diabetes and the pathology behind its complications. -his diabetes is complicated by chronic alcohol abuse, mild CKD and he  remains at a high risk for more acute and chronic complications which include CAD, CVA, CKD, retinopathy, and neuropathy. These are all discussed in detail with him.  The following Lifestyle Medicine recommendations according to [facility]) were discussed and offered to patient and he agrees to start the journey:  A. Whole Foods, Plant-based plate comprising of fruits and vegetables, plant-based proteins, whole-grain carbohydrates was discussed in detail with the patient.   A list for source of those nutrients were also provided to the patient.  Patient will use only water or unsweetened tea for hydration. B.  The need to stay away from risky substances including alcohol, smoking; obtaining 7 to 9 hours of restorative sleep, at least 150 minutes of moderate intensity exercise weekly, the importance of healthy social connections,  and stress reduction techniques were discussed. C.  A full color page of  Calorie density of various food groups per pound showing examples of each food groups was provided to the patient.  - Nutritional counseling repeated at each appointment due to patients tendency to fall back in to old habits.  - The patient admits there is a room for improvement in their diet and drink choices. -  Suggestion is made for the patient to avoid simple carbohydrates from their diet including Cakes, Sweet Desserts / Pastries, Ice Cream, Soda (diet and regular), Sweet Tea, Candies, Chips, Cookies, Sweet Pastries, Store Bought Juices, Alcohol in Excess of 1-2 drinks a day, Artificial Sweeteners, Coffee Creamer, and "Sugar-free" Products. This will help patient to have stable blood glucose profile and potentially avoid unintended weight gain.   - I encouraged the patient to switch to unprocessed or minimally processed complex starch and increased protein intake (animal or plant source), fruits, and vegetables.   - Patient is advised to stick to a routine  mealtimes to eat 3 meals a day and avoid unnecessary snacks (to snack only to correct hypoglycemia).  - I have approached him with the following individualized plan to manage  his diabetes and patient agrees:   -He is advised to continue his Lantus 35 units SQ nightly and increase his Humalog to 10-16 units TID with meals if glucose is above 90 and he is eating (Specific instructions on how to titrate insulin dosage based on glucose readings given to patient in writing).  He can continue his Metformin 500 mg ER twice daily.   He will do best to avoid ETOH altogether, to continue working his 12-step program.  -he is encouraged to continue monitoring blood glucose 4 times daily (using his CGM), before meals and before bed, and to call the clinic if he has readings less than 70 or greater than 300 for 3 tests in a row.     - he is warned not to take insulin without proper monitoring per orders. - Adjustment parameters are given to him for hypo and hyperglycemia in writing.  - Specific targets for  A1c;  LDL, HDL,  and Triglycerides were discussed with the patient.  2) Blood Pressure /Hypertension:  his blood pressure is controlled to target.   he is advised to continue his current medications  including Norvasc 5 mg po daily, HCTZ 25 mg po daily and Losartan 50 mg po daily.  3) Lipids/Hyperlipidemia:    Review of his recent lipid panel from 09/06/22 showed controlled LDL at 92.  He is not currently on lipid lowering medications likely due to elevated LFTs.  4)  Weight/Diet:  his Body mass index is 30.18 kg/m.  -  clearly complicating his diabetes care.   he is a candidate for weight loss. I discussed with him the fact that loss of 5 - 10% of his  current body weight will have the most impact on his diabetes management.  Exercise, and detailed carbohydrates information provided  -  detailed on discharge instructions.  5) Chronic Care/Health Maintenance: -he is on ACEI/ARB and Statin medications and  is encouraged to initiate and continue to follow up with Ophthalmology, Dentist, Podiatrist at least yearly or according to recommendations, and advised to stay away from smoking. I have recommended yearly flu vaccine and pneumonia vaccine at least every 5 years; moderate intensity exercise for up to 150 minutes weekly; and sleep for at least 7 hours a day.  - he is advised to maintain close follow up with Celene Squibb, MD for primary care needs, as well as his other providers for optimal and coordinated care.      I spent  43  minutes in the care of the patient today including review of labs from [facility], Lipids, Thyroid Function, Hematology (current and previous including abstractions from other facilities); face-to-face time discussing  his blood glucose readings/logs, discussing hypoglycemia and hyperglycemia episodes and symptoms, medications doses, his options of short and long term treatment based on the latest standards of care / guidelines;  discussion about incorporating lifestyle medicine;  and documenting the encounter. Risk reduction counseling performed per USPSTF guidelines to reduce obesity and cardiovascular risk factors.     Please refer to Patient Instructions for Blood Glucose Monitoring and Insulin/Medications Dosing Guide"  in media tab for additional information. Please  also refer to " Patient Self Inventory" in the Media  tab for reviewed elements of pertinent patient history.  Shirlean Mylar participated in the discussions, expressed understanding, and voiced agreement with the above plans.  All questions were answered to his satisfaction. he is encouraged to contact clinic should he have any questions or concerns prior to his return visit.   Follow up plan: - Return in about 3 months (around 12/10/2022) for Diabetes F/U with A1c in office, No previsit labs, Bring meter and logs.  Rayetta Pigg, [facility] [address] [place], [place] [address] Phone: [phone] Fax: [phone]  09/09/2022, 11:59 AM

## 2022-09-13 ENCOUNTER — Other Ambulatory Visit: Payer: Self-pay | Admitting: Gastroenterology

## 2022-09-13 ENCOUNTER — Telehealth: Payer: Self-pay | Admitting: *Deleted

## 2022-09-13 ENCOUNTER — Encounter: Payer: Self-pay | Admitting: *Deleted

## 2022-09-13 NOTE — Telephone Encounter (Signed)
Pt has been rescheduled from 10/11/22 until 11/01/22. New instructions mailed.

## 2022-09-14 ENCOUNTER — Telehealth (INDEPENDENT_AMBULATORY_CARE_PROVIDER_SITE_OTHER): Payer: Self-pay | Admitting: Gastroenterology

## 2022-09-14 ENCOUNTER — Ambulatory Visit: Payer: BC Managed Care – PPO | Admitting: Gastroenterology

## 2022-09-14 NOTE — Telephone Encounter (Signed)
Pt pre op appt is 10/27/22 in person at [facility] at 8:30 AM. Left detailed message on voicemail (OK per dpr).

## 2022-09-15 NOTE — Progress Notes (Unsigned)
[place], [place] [address]   CLINIC:  Medical Oncology/Hematology  PCP:  Celene Squibb, MD [address] Liana Cro[place] [address] [phone]   REASON FOR VISIT:  Follow-up for macrocytic anemia and thrombocytopenia  CURRENT THERAPY: Under workup  INTERVAL HISTORY:   Mr. Frankfort 57 y.o. male returns for routine follow-up of anemia and thrombocytopenia.  He was seen for initial consultation by Dr. Delton Coombes and Tarri Abernethy PA-C on 08/18/2022.  In the interim since his last visit, he was hospitalized from 08/25/2022 through 08/29/2022 for hematemesis secondary to suspected upper GI bleed.  He had EGD with variceal (grade 2) banding on 08/26/2022, although no overt bleeding was identified; nonbleeding angiodysplastic lesion in duodenum.  He did not require any PRBC transfusions while hospitalized.  At today's visit, he reports feeling ***.  He denies any recurrent hematemesis, melena, or hematochezia since hospital discharge.  *** He continues to follow closely with GI.  He reports easy bruising but denies any petechial rash.  *** *** No B symptoms, masses, or lymphadenopathy. *** He reports significant fatigue, ice pica, lightheadedness, and dyspnea on exertion.  *** No fatigue, fever, chills, shortness of breath, cough, chest pain, nausea, vomiting, abdominal pain.  Denies any signs or symptoms of blood loss.  No current signs or symptoms of blood clots.  He has ***% energy and ***% appetite. He endorses that he is maintaining a stable weight.   ASSESSMENT & PLAN:  1.  Thrombocytopenia & macrocytic anemia - Seen at the request of NP Cecile Sheerer for evaluation and treatment of anemia and thrombocytopenia. - Diagnosed with liver cirrhosis in January 2024 CT abdomen/pelvis (08/02/2022) showed nodular liver consistent with cirrhosis as well as stigmata of portal venous hypertension including moderate volume ascites, esophageal and gastric  varices, and recanalization of the umbilical vein; there were some scattered liver lesions noted, but MRI liver appeared benign. Splenomegaly on MRI with spleen measuring about 14.4 cm. Follows with [facility].  - Hematology workup (08/18/2022): SPEP negative for M spike.  Polyclonal increase in immunoglobulins on immunofixation.  Normal FLC ratio 1.31 (elevated kappa 38.9/elevated lambda 29.7) B12 and MMA normal.  Normal copper, folate. No evidence of renal dysfunction based on creatinine 0.82/GFR >60 Ferritin 375, iron saturation 33% Elevated immature platelet fraction 8.7% LDH 210, reticulocytes 2.3%.  Bilirubin 2.3 (recently diagnosed liver cirrhosis and other elevations in LFTs are noted) HIV negative (08/03/2022).  Viral hepatitis panel obtained in September 2023 was negative.  ANA and RF were negative. - Hospitalized from 08/25/2022 through 08/29/2022 for hematemesis secondary to suspected upper GI bleed.  He had EGD with variceal (grade 2) banding on 08/26/2022, although no overt bleeding was identified; nonbleeding angiodysplastic lesion in duodenum.  He did not require any PRBC transfusions while hospitalized. - Colonoscopy on 04/05/2022 with nonbleeding internal hemorrhoids, polyps x 6, otherwise normal.   - Reports easy bruising.  No rectal bleeding, melena, or epistaxis since hospital discharge.  No B symptoms.*** - Most recent CBC (08/29/2022): Hemoglobin 9.4/MCV 102.3, platelets 89 - DIFFERENTIAL DIAGNOSIS favors thrombocytopenia and macrocytic anemia secondary to liver cirrhosis/splenomegaly.  He has acute worsening of his anemia secondary to recent upper GI bleed. - PLAN: Acute worsening of anemia likely due to recent upper GI bleed.  Iron stores appear adequate at this time, no indication for IV iron. - We will recheck CBC/D in 1 month (labs only) - Full lab panel in 2 months = CBC/D, ferritin, iron/TIBC,  bilirubin fraction, LDH, reticulocytes, Cystatin C in 2  months - *** PHONE visit 2 weeks after labs ***   2.  Other history - PMH: Liver cirrhosis, vertigo, sleep apnea, psoriasis, hypertension, GERD, diabetes, chronic back pain, anxiety/depression - TOBACCO: Currently smokes 0.5 PPD cigarettes.  Has been smoking 0.25-0.5 PPD since age 53, but had quit for about 10 years.  Total pack-year history approximately 14. - ALCOHOL: Heavy alcohol consumption for over 30 years.  Has cut back to 1 to 2 cans of beer each week following diagnosis of liver cirrhosis in February 2024. - DRUG/SUBSTANCE ABUSE: Reports previous addiction to hydrocodone pills, which he self reported to his PCP; hydrocodone was discontinued by PCP at the request of the patient. - SOCIAL he works for [employer], but has not been back to work following hospitalization for cirrhosis in January/February 2024.  He is accompanied in clinic by his girlfriend, [name]. - FAMILY: No family history of blood problems.  Maternal grandfather had lung cancer.  Father deceased secondary to mesothelioma.  PLAN SUMMARY: >> CBC/D only in 1 month >> Full lab panel in 2 months = CBC/D, ferritin, iron/TIBC, bilirubin fraction, LDH, reticulocytes, Cystatin C (Misc [facility] send-out) >> PHONE visit 2 weeks after full lab panel     REVIEW OF SYSTEMS: ***  Review of Systems - Oncology   PHYSICAL EXAM:  ECOG PERFORMANCE STATUS: {CHL ONC ECOG [id number] *** There were no vitals filed for this visit. There were no vitals filed for this visit. Physical Exam  PAST MEDICAL/SURGICAL HISTORY:  Past Medical History:  Diagnosis Date   Anxiety    Arthritis    affects hands, shoulder, neck, knees, hips, ankles, toes   Chronic back pain    Diabetes mellitus    diet controlled   Eczema    Fatty liver, alcoholic    GERD (gastroesophageal reflux disease)    Glaucoma    slight case   Hypertension    Psoriasis    Sleep apnea    Substance abuse ([place])    Vertigo 09/11/2017   Wears glasses    Past  Surgical History:  Procedure Laterality Date   COLONOSCOPY WITH PROPOFOL N/A 04/05/2022   Procedure: COLONOSCOPY WITH PROPOFOL;  Surgeon: [name], DO;  [facility];  Service: Endoscopy;  Laterality: N/A;  10:45 AM,unable to reach pt to move up   [place] 08/26/2022   Procedure: ESOPHAGEAL BANDING;  Surgeon: [name], DO;  [facility];  Service: Endoscopy;  Laterality: N/A;   ESOPHAGOGASTRODUODENOSCOPY (EGD) WITH PROPOFOL N/A 08/26/2022   Procedure: ESOPHAGOGASTRODUODENOSCOPY (EGD) WITH PROPOFOL;  Surgeon: [name], DO;  [facility];  Service: Endoscopy;  Laterality: N/A;   MULTIPLE TOOTH EXTRACTIONS     POLYPECTOMY  04/05/2022   Procedure: POLYPECTOMY;  Surgeon: [name], DO;  [facility];  Service: Endoscopy;;   POSTERIOR CERVICAL LAMINECTOMY  04/21/2012   Procedure: POSTERIOR CERVICAL LAMINECTOMY;  Surgeon: Elaina Hoops, MD;  Location: [facility];  Service: Neurosurgery;  Laterality: Left;  Posterior Cervical laminectomy/foraminotomy and diskectomy, left cervical seven-thoracic one   ROTATOR CUFF REPAIR     THORACIC DISCECTOMY Left 04/03/2014   Procedure: Left Thoracic Seven to Eight, Thoracic Eight to Nine Thoracic Discectomy ;  Surgeon: Elaina Hoops, MD;  Location: [facility];  Service: Neurosurgery;  Laterality: Left;    SOCIAL HISTORY:  Social History   Socioeconomic History   Marital status: Divorced  Spouse name: Not on file   Number of children: Not on file   Years of education: Not on file   Highest education level: Not on file  Occupational History    Comment: Truck to storage  Tobacco Use   Smoking status: Every Day    Packs/day: 0.25    Years: 40.00    Total pack years: 10.00    Types: Cigarettes    Last attempt to quit: 05/09/2022    Years since quitting: 0.3   Smokeless tobacco: Never   Tobacco comments:    pt trying to quit on his own  Vaping Use   Vaping Use:  Former  Substance and Sexual Activity   Alcohol use: Yes    Alcohol/week: 12.0 standard drinks of alcohol    Types: 12 Cans of beer per week    Comment: Either Smirnoff Smash-4 per day or Fireball-10 airplane bottles in a day   Drug use: No   Sexual activity: Yes    Birth control/protection: None  Other Topics Concern   Not on file  Social History Narrative   Not on file   Social Determinants of Health   Financial Resource Strain: Not on file  Food Insecurity: No Food Insecurity (08/31/2022)   Hunger Vital Sign    Worried About Running Out of Food in the Last Year: Never true    Ran Out of Food in the Last Year: Never true  Transportation Needs: No Transportation Needs (08/31/2022)   PRAPARE - Hydrologist (Medical): No    Lack of Transportation (Non-Medical): No  Physical Activity: Not on file  Stress: Not on file  Social Connections: Not on file  Intimate Partner Violence: Not At Risk (08/26/2022)   Humiliation, Afraid, Rape, and Kick questionnaire    Fear of Current or Ex-Partner: No    Emotionally Abused: No    Physically Abused: No    Sexually Abused: No    FAMILY HISTORY:  Family History  Problem Relation Age of Onset   Diabetes Father    Cancer - Lung Father    Hypertension Other    Cancer - Lung Other     CURRENT MEDICATIONS:  Outpatient Encounter Medications as of 09/16/2022  Medication Sig Note   acetaminophen (TYLENOL) 325 MG tablet Take 650 mg by mouth as needed for mild pain, headache or moderate pain.    albuterol (VENTOLIN HFA) 108 (90 Base) MCG/ACT inhaler Inhale 2 puffs into the lungs every 6 (six) hours as needed for wheezing or shortness of breath.    carvedilol (COREG) 3.125 MG tablet Take 1 tablet (3.125 mg total) by mouth 2 (two) times daily with a meal.    Continuous Blood Gluc Sensor (DEXCOM G7 SENSOR) MISC Inject 1 application. into the skin as directed. Change sensor every 10 days as directed.    FLUoxetine (PROZAC)  10 MG capsule TAKE 4 CAPSULES BY MOUTH EVERY DAY (Patient taking differently: Take 10 mg by mouth See admin instructions. TAKE 2 CAPSULES IN THE MORNING AND 2 CAPSULES EVERY EVENING.)    furosemide (LASIX) 40 MG tablet Take 1 tablet (40 mg total) by mouth daily.    glucose blood test strip Use as instructed to monitor glucose 4 times daily    insulin glargine (LANTUS SOLOSTAR) 100 UNIT/ML Solostar Pen Inject 35 Units into the skin at bedtime.    insulin lispro (HUMALOG KWIKPEN) 100 UNIT/ML KwikPen Inject 10-16 Units into the skin 3 (three) times daily.  Insulin Pen Needle (PEN NEEDLES) 32G X 4 MM MISC 1 each by Does not apply route in the morning, at noon, in the evening, and at bedtime. Use to inject insulin 4 times daily    lactulose (CHRONULAC) 10 GM/15ML solution Take 15 mLs (10 g total) by mouth 2 (two) times daily.    meclizine (ANTIVERT) 12.5 MG tablet Take 12.5 mg by mouth 3 (three) times daily as needed for dizziness or nausea.    metFORMIN (GLUCOPHAGE-XR) 500 MG 24 hr tablet Take 2 tablets (1,000 mg total) by mouth 2 (two) times daily with a meal.    montelukast (SINGULAIR) 10 MG tablet TAKE 1 TABLET(10 MG) BY MOUTH AT BEDTIME    Multiple Vitamin (MULTIVITAMIN) tablet Take 1 tablet by mouth daily.    pantoprazole (PROTONIX) 40 MG tablet Take 1 tablet (40 mg total) by mouth 2 (two) times daily.    spironolactone (ALDACTONE) 100 MG tablet Take 1 tablet (100 mg total) by mouth daily.    sucralfate (CARAFATE) 1 GM/10ML suspension Take 10 mLs (1 g total) by mouth 4 (four) times daily -  with meals and at bedtime for 14 days.    thiamine (VITAMIN B-1) 100 MG tablet Take 1 tablet (100 mg total) by mouth daily. 08/06/2022: 08/06/22: reports to pick up today from [facility], was not ready yesterday   No facility-administered encounter medications on file as of 09/16/2022.    ALLERGIES:  Allergies  Allergen Reactions   Neosporin + Pain Relief Max St [Neomy-Bacit-Polymyx-Pramoxine] Rash     LABORATORY DATA:  I have reviewed the labs as listed.  CBC    Component Value Date/Time   WBC 9.4 08/29/2022 0446   RBC 2.56 (L) 08/29/2022 0446   HGB 9.4 (L) 08/29/2022 0446   HGB 14.4 07/31/2021 1204   HCT 26.2 (L) 08/29/2022 0446   HCT 42.4 07/31/2021 1204   PLT 89 (L) 08/29/2022 0446   PLT 146 (L) 07/31/2021 1204   MCV 102.3 (H) 08/29/2022 0446   MCV 97 07/31/2021 1204   MCH 36.7 (H) 08/29/2022 0446   MCHC 35.9 08/29/2022 0446   RDW 17.6 (H) 08/29/2022 0446   RDW 11.8 07/31/2021 1204   LYMPHSABS 1.1 08/18/2022 0958   LYMPHSABS 1.6 07/31/2021 1204   MONOABS 1.0 08/18/2022 0958   EOSABS 0.3 08/18/2022 0958   EOSABS 0.2 07/31/2021 1204   BASOSABS 0.0 08/18/2022 0958   BASOSABS 0.1 07/31/2021 1204      Latest Ref Rng & Units 08/29/2022    4:46 AM 08/28/2022    2:51 AM 08/27/2022    5:01 AM  CMP  Glucose 70 - 99 mg/dL 205  150  173   BUN 6 - 20 mg/dL '9  12  18   '$ Creatinine 0.61 - 1.24 mg/dL 0.86  1.12  1.01   Sodium 135 - 145 mmol/L 128  128  128   Potassium 3.5 - 5.1 mmol/L 3.9  3.6  3.6   Chloride 98 - 111 mmol/L 100  97  96   CO2 22 - 32 mmol/L '23  25  25   '$ Calcium 8.9 - 10.3 mg/dL 7.5  7.6  7.8   Total Protein 6.5 - 8.1 g/dL 4.9  5.1  5.4   Total Bilirubin 0.3 - 1.2 mg/dL 2.0  2.3  3.2   Alkaline Phos 38 - 126 U/L 167  166  130   AST 15 - 41 U/L 84  136  149   ALT 0 - 44 U/L  59  72  78     DIAGNOSTIC IMAGING:  I have independently reviewed the relevant imaging and discussed with the patient.   WRAP UP:  All questions were answered. The patient knows to call the clinic with any problems, questions or concerns.  Medical decision making: ***  Time spent on visit: I spent *** minutes counseling the patient face to face. The total time spent in the appointment was *** minutes and more than 50% was on counseling.  Harriett Rush, PA-C  ***

## 2022-09-16 ENCOUNTER — Inpatient Hospital Stay: Payer: BC Managed Care – PPO | Attending: Hematology | Admitting: Physician Assistant

## 2022-09-16 ENCOUNTER — Other Ambulatory Visit: Payer: Self-pay

## 2022-09-16 ENCOUNTER — Inpatient Hospital Stay: Payer: BC Managed Care – PPO

## 2022-09-16 VITALS — BP 125/75 | HR 79 | Temp 98.5°F | Resp 18 | Ht 66.0 in | Wt 189.4 lb

## 2022-09-16 DIAGNOSIS — D649 Anemia, unspecified: Secondary | ICD-10-CM | POA: Diagnosis not present

## 2022-09-16 DIAGNOSIS — Z7984 Long term (current) use of oral hypoglycemic drugs: Secondary | ICD-10-CM | POA: Diagnosis not present

## 2022-09-16 DIAGNOSIS — K760 Fatty (change of) liver, not elsewhere classified: Secondary | ICD-10-CM | POA: Diagnosis not present

## 2022-09-16 DIAGNOSIS — D696 Thrombocytopenia, unspecified: Secondary | ICD-10-CM | POA: Insufficient documentation

## 2022-09-16 DIAGNOSIS — F32A Depression, unspecified: Secondary | ICD-10-CM | POA: Diagnosis not present

## 2022-09-16 DIAGNOSIS — K219 Gastro-esophageal reflux disease without esophagitis: Secondary | ICD-10-CM | POA: Diagnosis not present

## 2022-09-16 DIAGNOSIS — F419 Anxiety disorder, unspecified: Secondary | ICD-10-CM | POA: Insufficient documentation

## 2022-09-16 DIAGNOSIS — D539 Nutritional anemia, unspecified: Secondary | ICD-10-CM

## 2022-09-16 DIAGNOSIS — E119 Type 2 diabetes mellitus without complications: Secondary | ICD-10-CM | POA: Insufficient documentation

## 2022-09-16 DIAGNOSIS — Z79899 Other long term (current) drug therapy: Secondary | ICD-10-CM | POA: Insufficient documentation

## 2022-09-16 DIAGNOSIS — Z794 Long term (current) use of insulin: Secondary | ICD-10-CM | POA: Insufficient documentation

## 2022-09-16 DIAGNOSIS — K7460 Unspecified cirrhosis of liver: Secondary | ICD-10-CM | POA: Insufficient documentation

## 2022-09-16 DIAGNOSIS — F17210 Nicotine dependence, cigarettes, uncomplicated: Secondary | ICD-10-CM | POA: Insufficient documentation

## 2022-09-16 DIAGNOSIS — I10 Essential (primary) hypertension: Secondary | ICD-10-CM | POA: Insufficient documentation

## 2022-09-16 LAB — CBC
HCT: 32.6 % — ABNORMAL LOW (ref 39.0–52.0)
Hemoglobin: 11.2 g/dL — ABNORMAL LOW (ref 13.0–17.0)
MCH: 33.9 pg (ref 26.0–34.0)
MCHC: 34.4 g/dL (ref 30.0–36.0)
MCV: 98.8 fL (ref 80.0–100.0)
Platelets: 132 10*3/uL — ABNORMAL LOW (ref 150–400)
RBC: 3.3 MIL/uL — ABNORMAL LOW (ref 4.22–5.81)
RDW: 15 % (ref 11.5–15.5)
WBC: 10.9 10*3/uL — ABNORMAL HIGH (ref 4.0–10.5)
nRBC: 0 % (ref 0.0–0.2)

## 2022-09-16 LAB — OCCULT BLOOD X 1 CARD TO LAB, STOOL
Fecal Occult Bld: NEGATIVE
Fecal Occult Bld: NEGATIVE

## 2022-09-16 LAB — SAMPLE TO BLOOD BANK

## 2022-09-16 NOTE — Patient Instructions (Signed)
[facility] **   You were seen today by Tarri Abernethy PA-C for your anemia (low red blood cells) and thrombocytopenia (low platelets).  We suspect that your abnormal blood counts are related to your liver cirrhosis and recent GI bleeding.  Due to your recent blood loss, we will recheck your labs again in 1 month to make sure that you are blood counts have improved appropriately.  We will check lab panel and see you for office visit in 2 months.   ** Thank you for trusting me with your healthcare!  I strive to provide all of my patients with quality care at each visit.  If you receive a survey for this visit, I would be so grateful to you for taking the time to provide feedback.  Thank you in advance!  ~ Jacquez Sheetz                   Dr. Derek Jack   &   Tarri Abernethy, PA-C   - - - - - - - - - - - - - - - - - -    Thank you for choosing [facility] to provide your oncology and hematology care.  To afford each patient quality time with our provider, please arrive at least 15 minutes before your scheduled appointment time.   If you have a lab appointment with the [facility] please come in thru the Main Entrance and check in at the main information desk.  You need to re-schedule your appointment should you arrive 10 or more minutes late.  We strive to give you quality time with our providers, and arriving late affects you and other patients whose appointments are after yours.  Also, if you no show three or more times for appointments you may be dismissed from the clinic at the providers discretion.     Again, thank you for choosing [facility].  Our hope is that these requests will decrease the amount of time that you wait before being seen by our physicians.       _____________________________________________________________  Should you have questions  after your visit to [facility], please contact our office at [phone] and follow the prompts.  Our office hours are 8:00 a.m. and 4:30 p.m. Monday - Friday.  Please note that voicemails left after 4:00 p.m. may not be returned until the following business day.  We are closed weekends and major holidays.  You do have access to a nurse 24-7, just call the main number to the clinic [phone] and do not press any options, hold on the line and a nurse will answer the phone.    For prescription refill requests, have your pharmacy contact our office and allow 72 hours.

## 2022-09-18 DIAGNOSIS — I10 Essential (primary) hypertension: Secondary | ICD-10-CM | POA: Diagnosis not present

## 2022-09-18 DIAGNOSIS — G4700 Insomnia, unspecified: Secondary | ICD-10-CM | POA: Diagnosis not present

## 2022-09-18 DIAGNOSIS — F419 Anxiety disorder, unspecified: Secondary | ICD-10-CM | POA: Diagnosis not present

## 2022-09-18 DIAGNOSIS — R112 Nausea with vomiting, unspecified: Secondary | ICD-10-CM | POA: Diagnosis not present

## 2022-09-18 DIAGNOSIS — E1165 Type 2 diabetes mellitus with hyperglycemia: Secondary | ICD-10-CM | POA: Diagnosis not present

## 2022-09-18 DIAGNOSIS — F1010 Alcohol abuse, uncomplicated: Secondary | ICD-10-CM | POA: Diagnosis not present

## 2022-09-18 DIAGNOSIS — R42 Dizziness and giddiness: Secondary | ICD-10-CM | POA: Diagnosis not present

## 2022-09-23 ENCOUNTER — Telehealth: Payer: Self-pay

## 2022-09-23 NOTE — Telephone Encounter (Signed)
Papers received by [facility] for STD claim form.  Patient instructed these papers need to be managed by his PCP or Gastroenterologist for their management of his cirrhosis per Tarri Abernethy, PA.  Patient verbalized understanding.

## 2022-09-24 ENCOUNTER — Observation Stay (HOSPITAL_COMMUNITY): Payer: BC Managed Care – PPO | Admitting: Anesthesiology

## 2022-09-24 ENCOUNTER — Inpatient Hospital Stay (HOSPITAL_COMMUNITY)
Admission: EM | Admit: 2022-09-24 | Discharge: 2022-09-28 | DRG: 432 | Disposition: A | Discharge status: Home / Self Care | Payer: BC Managed Care – PPO | Attending: Family Medicine | Admitting: Family Medicine

## 2022-09-24 ENCOUNTER — Telehealth: Payer: Self-pay

## 2022-09-24 ENCOUNTER — Encounter (HOSPITAL_COMMUNITY): Payer: Self-pay | Admitting: Family Medicine

## 2022-09-24 ENCOUNTER — Other Ambulatory Visit: Payer: Self-pay

## 2022-09-24 ENCOUNTER — Telehealth: Payer: Self-pay | Admitting: *Deleted

## 2022-09-24 ENCOUNTER — Encounter (HOSPITAL_COMMUNITY)
Admission: EM | Disposition: A | Discharge status: Home / Self Care | Payer: Self-pay | Source: Home / Self Care | Attending: Family Medicine

## 2022-09-24 DIAGNOSIS — L409 Psoriasis, unspecified: Secondary | ICD-10-CM | POA: Diagnosis present

## 2022-09-24 DIAGNOSIS — G8929 Other chronic pain: Secondary | ICD-10-CM | POA: Diagnosis present

## 2022-09-24 DIAGNOSIS — K219 Gastro-esophageal reflux disease without esophagitis: Secondary | ICD-10-CM | POA: Diagnosis present

## 2022-09-24 DIAGNOSIS — D649 Anemia, unspecified: Secondary | ICD-10-CM | POA: Diagnosis present

## 2022-09-24 DIAGNOSIS — G4730 Sleep apnea, unspecified: Secondary | ICD-10-CM | POA: Diagnosis not present

## 2022-09-24 DIAGNOSIS — D696 Thrombocytopenia, unspecified: Secondary | ICD-10-CM | POA: Diagnosis present

## 2022-09-24 DIAGNOSIS — I8510 Secondary esophageal varices without bleeding: Secondary | ICD-10-CM | POA: Diagnosis not present

## 2022-09-24 DIAGNOSIS — M549 Dorsalgia, unspecified: Secondary | ICD-10-CM | POA: Diagnosis present

## 2022-09-24 DIAGNOSIS — Z9109 Other allergy status, other than to drugs and biological substances: Secondary | ICD-10-CM

## 2022-09-24 DIAGNOSIS — F1010 Alcohol abuse, uncomplicated: Secondary | ICD-10-CM | POA: Diagnosis present

## 2022-09-24 DIAGNOSIS — E871 Hypo-osmolality and hyponatremia: Secondary | ICD-10-CM | POA: Diagnosis not present

## 2022-09-24 DIAGNOSIS — K31819 Angiodysplasia of stomach and duodenum without bleeding: Secondary | ICD-10-CM | POA: Diagnosis present

## 2022-09-24 DIAGNOSIS — F17210 Nicotine dependence, cigarettes, uncomplicated: Secondary | ICD-10-CM | POA: Diagnosis present

## 2022-09-24 DIAGNOSIS — Z79899 Other long term (current) drug therapy: Secondary | ICD-10-CM

## 2022-09-24 DIAGNOSIS — I8501 Esophageal varices with bleeding: Secondary | ICD-10-CM | POA: Diagnosis not present

## 2022-09-24 DIAGNOSIS — D539 Nutritional anemia, unspecified: Secondary | ICD-10-CM | POA: Diagnosis present

## 2022-09-24 DIAGNOSIS — Z833 Family history of diabetes mellitus: Secondary | ICD-10-CM | POA: Diagnosis not present

## 2022-09-24 DIAGNOSIS — E1165 Type 2 diabetes mellitus with hyperglycemia: Secondary | ICD-10-CM | POA: Diagnosis present

## 2022-09-24 DIAGNOSIS — I8511 Secondary esophageal varices with bleeding: Secondary | ICD-10-CM | POA: Diagnosis not present

## 2022-09-24 DIAGNOSIS — K7031 Alcoholic cirrhosis of liver with ascites: Secondary | ICD-10-CM | POA: Diagnosis not present

## 2022-09-24 DIAGNOSIS — K700 Alcoholic fatty liver: Secondary | ICD-10-CM | POA: Diagnosis present

## 2022-09-24 DIAGNOSIS — Z8249 Family history of ischemic heart disease and other diseases of the circulatory system: Secondary | ICD-10-CM | POA: Diagnosis not present

## 2022-09-24 DIAGNOSIS — D62 Acute posthemorrhagic anemia: Secondary | ICD-10-CM | POA: Diagnosis present

## 2022-09-24 DIAGNOSIS — K922 Gastrointestinal hemorrhage, unspecified: Secondary | ICD-10-CM | POA: Diagnosis not present

## 2022-09-24 DIAGNOSIS — K920 Hematemesis: Secondary | ICD-10-CM | POA: Diagnosis present

## 2022-09-24 DIAGNOSIS — K7030 Alcoholic cirrhosis of liver without ascites: Secondary | ICD-10-CM | POA: Diagnosis not present

## 2022-09-24 DIAGNOSIS — E876 Hypokalemia: Secondary | ICD-10-CM | POA: Diagnosis not present

## 2022-09-24 DIAGNOSIS — Z7984 Long term (current) use of oral hypoglycemic drugs: Secondary | ICD-10-CM

## 2022-09-24 DIAGNOSIS — K766 Portal hypertension: Secondary | ICD-10-CM | POA: Diagnosis present

## 2022-09-24 DIAGNOSIS — R601 Generalized edema: Secondary | ICD-10-CM | POA: Diagnosis not present

## 2022-09-24 DIAGNOSIS — K7290 Hepatic failure, unspecified without coma: Secondary | ICD-10-CM | POA: Diagnosis not present

## 2022-09-24 DIAGNOSIS — F419 Anxiety disorder, unspecified: Secondary | ICD-10-CM | POA: Diagnosis present

## 2022-09-24 DIAGNOSIS — K7010 Alcoholic hepatitis without ascites: Secondary | ICD-10-CM | POA: Diagnosis present

## 2022-09-24 DIAGNOSIS — Z794 Long term (current) use of insulin: Secondary | ICD-10-CM | POA: Diagnosis not present

## 2022-09-24 DIAGNOSIS — E119 Type 2 diabetes mellitus without complications: Secondary | ICD-10-CM

## 2022-09-24 DIAGNOSIS — K7682 Hepatic encephalopathy: Secondary | ICD-10-CM | POA: Diagnosis not present

## 2022-09-24 DIAGNOSIS — I10 Essential (primary) hypertension: Secondary | ICD-10-CM | POA: Diagnosis not present

## 2022-09-24 DIAGNOSIS — K7460 Unspecified cirrhosis of liver: Secondary | ICD-10-CM

## 2022-09-24 DIAGNOSIS — R000 Tachycardia, unspecified: Secondary | ICD-10-CM | POA: Diagnosis not present

## 2022-09-24 HISTORY — PX: ESOPHAGOGASTRODUODENOSCOPY (EGD) WITH PROPOFOL: SHX5813

## 2022-09-24 HISTORY — PX: ESOPHAGEAL BANDING: SHX5518

## 2022-09-24 LAB — CBC
HCT: 27.9 % — ABNORMAL LOW (ref 39.0–52.0)
HCT: 26.5 % — ABNORMAL LOW (ref 39.0–52.0)
Hemoglobin: 9.6 g/dL — ABNORMAL LOW (ref 13.0–17.0)
Hemoglobin: 8.9 g/dL — ABNORMAL LOW (ref 13.0–17.0)
MCH: 34.5 pg — ABNORMAL HIGH (ref 26.0–34.0)
MCH: 33.8 pg (ref 26.0–34.0)
MCHC: 34.4 g/dL (ref 30.0–36.0)
MCHC: 33.6 g/dL (ref 30.0–36.0)
MCV: 100.4 fL — ABNORMAL HIGH (ref 80.0–100.0)
MCV: 100.8 fL — ABNORMAL HIGH (ref 80.0–100.0)
Platelets: 118 10*3/uL — ABNORMAL LOW (ref 150–400)
Platelets: 70 10*3/uL — ABNORMAL LOW (ref 150–400)
RBC: 2.78 MIL/uL — ABNORMAL LOW (ref 4.22–5.81)
RBC: 2.63 MIL/uL — ABNORMAL LOW (ref 4.22–5.81)
RDW: 14.8 % (ref 11.5–15.5)
RDW: 14.9 % (ref 11.5–15.5)
WBC: 12.8 10*3/uL — ABNORMAL HIGH (ref 4.0–10.5)
WBC: 14.3 10*3/uL — ABNORMAL HIGH (ref 4.0–10.5)
nRBC: 0 % (ref 0.0–0.2)
nRBC: 0 % (ref 0.0–0.2)

## 2022-09-24 LAB — COMPREHENSIVE METABOLIC PANEL
ALT: 28 U/L (ref 0–44)
AST: 74 U/L — ABNORMAL HIGH (ref 15–41)
Albumin: 2.3 g/dL — ABNORMAL LOW (ref 3.5–5.0)
Alkaline Phosphatase: 238 U/L — ABNORMAL HIGH (ref 38–126)
Anion gap: 14 (ref 5–15)
BUN: 11 mg/dL (ref 6–20)
CO2: 24 mmol/L (ref 22–32)
Calcium: 7.8 mg/dL — ABNORMAL LOW (ref 8.9–10.3)
Chloride: 94 mmol/L — ABNORMAL LOW (ref 98–111)
Creatinine, Ser: 0.87 mg/dL (ref 0.61–1.24)
GFR, Estimated: >60 mL/min (ref >60)
Glucose, Bld: 264 mg/dL — ABNORMAL HIGH (ref 70–99)
Potassium: 3.8 mmol/L (ref 3.5–5.1)
Sodium: 132 mmol/L — ABNORMAL LOW (ref 135–145)
Total Bilirubin: 3.9 mg/dL — ABNORMAL HIGH (ref 0.3–1.2)
Total Protein: 5.7 g/dL — ABNORMAL LOW (ref 6.5–8.1)

## 2022-09-24 LAB — I-STAT CHEM 8, ED
BUN: 11 mg/dL (ref 6–20)
Calcium, Ion: 0.94 mmol/L — ABNORMAL LOW (ref 1.15–1.40)
Chloride: 93 mmol/L — ABNORMAL LOW (ref 98–111)
Creatinine, Ser: 1 mg/dL (ref 0.61–1.24)
Glucose, Bld: 252 mg/dL — ABNORMAL HIGH (ref 70–99)
HCT: 30 % — ABNORMAL LOW (ref 39.0–52.0)
Hemoglobin: 10.2 g/dL — ABNORMAL LOW (ref 13.0–17.0)
Potassium: 4.1 mmol/L (ref 3.5–5.1)
Sodium: 135 mmol/L (ref 135–145)
TCO2: 27 mmol/L (ref 22–32)

## 2022-09-24 LAB — PREPARE RBC (CROSSMATCH)

## 2022-09-24 LAB — PROTIME-INR
INR: 2.2 — ABNORMAL HIGH (ref 0.8–1.2)
Prothrombin Time: 24.4 seconds — ABNORMAL HIGH (ref 11.4–15.2)

## 2022-09-24 LAB — LIPASE, BLOOD: Lipase: 51 U/L (ref 11–51)

## 2022-09-24 LAB — GLUCOSE, CAPILLARY
Glucose-Capillary: 255 mg/dL — ABNORMAL HIGH (ref 70–99)
Glucose-Capillary: 308 mg/dL — ABNORMAL HIGH (ref 70–99)

## 2022-09-24 LAB — MRSA NEXT GEN BY PCR, NASAL: MRSA by PCR Next Gen: NOT DETECTED

## 2022-09-24 SURGERY — ESOPHAGOGASTRODUODENOSCOPY (EGD) WITH PROPOFOL
Anesthesia: General

## 2022-09-24 MED ORDER — SODIUM CHLORIDE 0.9 % IV SOLN: INTRAVENOUS | Status: DC

## 2022-09-24 MED ORDER — FOLIC ACID 1 MG PO TABS
1.0000 mg | ORAL_TABLET | Freq: Every day | ORAL | Status: DC
  Administered 2022-09-24 – 2022-09-28 (×5): 1 mg via ORAL
  Filled 2022-09-24 (×6): qty 1

## 2022-09-24 MED ORDER — HYDROMORPHONE HCL 1 MG/ML IJ SOLN
0.2500 mg | INTRAMUSCULAR | Status: DC | PRN
  Administered 2022-09-24: 0.5 mg via INTRAVENOUS
  Filled 2022-09-24: qty 0.5

## 2022-09-24 MED ORDER — ONDANSETRON HCL 4 MG PO TABS
4.0000 mg | ORAL_TABLET | Freq: Four times a day (QID) | ORAL | Status: DC | PRN
  Administered 2022-09-27: 4 mg via ORAL
  Filled 2022-09-24: qty 1

## 2022-09-24 MED ORDER — ADULT MULTIVITAMIN W/MINERALS CH
1.0000 | ORAL_TABLET | Freq: Every day | ORAL | Status: DC
  Administered 2022-09-24 – 2022-09-28 (×5): 1 via ORAL
  Filled 2022-09-24 (×6): qty 1

## 2022-09-24 MED ORDER — POLYETHYLENE GLYCOL 3350 17 G PO PACK: 17.0000 g | PACK | Freq: Every day | ORAL | Status: DC | PRN

## 2022-09-24 MED ORDER — LORAZEPAM 1 MG PO TABS
1.0000 mg | ORAL_TABLET | ORAL | Status: AC | PRN
  Administered 2022-09-27: 1 mg via ORAL
  Filled 2022-09-24: qty 1
  Filled 2022-09-24: qty 2

## 2022-09-24 MED ORDER — PANTOPRAZOLE SODIUM 40 MG IV SOLR
40.0000 mg | Freq: Two times a day (BID) | INTRAVENOUS | Status: DC
  Administered 2022-09-24 – 2022-09-27 (×6): 40 mg via INTRAVENOUS
  Filled 2022-09-24 (×6): qty 10

## 2022-09-24 MED ORDER — DEXAMETHASONE SODIUM PHOSPHATE 10 MG/ML IJ SOLN
INTRAMUSCULAR | Status: DC | PRN
  Administered 2022-09-24: 8 mg via INTRAVENOUS

## 2022-09-24 MED ORDER — FENTANYL CITRATE (PF) 100 MCG/2ML IJ SOLN
INTRAMUSCULAR | Status: DC | PRN
  Administered 2022-09-24: 75 ug via INTRAVENOUS
  Administered 2022-09-24: 25 ug via INTRAVENOUS

## 2022-09-24 MED ORDER — SODIUM CHLORIDE 0.9 % IV SOLN: INTRAVENOUS | Status: DC | PRN

## 2022-09-24 MED ORDER — LIDOCAINE HCL (CARDIAC) PF 50 MG/5ML IV SOSY
PREFILLED_SYRINGE | INTRAVENOUS | Status: DC | PRN
  Administered 2022-09-24: 50 mg via INTRAVENOUS

## 2022-09-24 MED ORDER — LACTATED RINGERS IV SOLN: INTRAVENOUS | Status: DC

## 2022-09-24 MED ORDER — SODIUM CHLORIDE 0.9 % IV SOLN
2.0000 g | Freq: Once | INTRAVENOUS | Status: AC
  Administered 2022-09-24: 2 g via INTRAVENOUS
  Filled 2022-09-24: qty 20

## 2022-09-24 MED ORDER — THIAMINE MONONITRATE 100 MG PO TABS
100.0000 mg | ORAL_TABLET | Freq: Every day | ORAL | Status: DC
  Administered 2022-09-24 – 2022-09-28 (×4): 100 mg via ORAL
  Filled 2022-09-24 (×6): qty 1

## 2022-09-24 MED ORDER — SODIUM CHLORIDE 0.9% IV SOLUTION: Freq: Once | INTRAVENOUS | Status: DC

## 2022-09-24 MED ORDER — SODIUM CHLORIDE 0.9 % IV SOLN
2.0000 g | INTRAVENOUS | Status: DC
  Administered 2022-09-25 – 2022-09-28 (×4): 2 g via INTRAVENOUS
  Filled 2022-09-24 (×4): qty 20

## 2022-09-24 MED ORDER — OCTREOTIDE ACETATE 500 MCG/ML IJ SOLN
INTRAMUSCULAR | Status: AC
  Filled 2022-09-24: qty 1

## 2022-09-24 MED ORDER — INSULIN ASPART 100 UNIT/ML IJ SOLN
0.0000 [IU] | Freq: Every day | INTRAMUSCULAR | Status: DC
  Administered 2022-09-24: 4 [IU] via SUBCUTANEOUS
  Administered 2022-09-25: 2 [IU] via SUBCUTANEOUS
  Administered 2022-09-26: 3 [IU] via SUBCUTANEOUS
  Filled 2022-09-24: qty 0.05

## 2022-09-24 MED ORDER — LACTULOSE 10 GM/15ML PO SOLN
10.0000 g | Freq: Two times a day (BID) | ORAL | Status: DC
  Administered 2022-09-24 – 2022-09-25 (×2): 10 g via ORAL
  Filled 2022-09-24 (×2): qty 30
  Filled 2022-09-24: qty 15

## 2022-09-24 MED ORDER — OCTREOTIDE LOAD VIA INFUSION
100.0000 ug | Freq: Once | INTRAVENOUS | Status: AC
  Administered 2022-09-24: 100 ug via INTRAVENOUS
  Filled 2022-09-24: qty 50

## 2022-09-24 MED ORDER — PROPOFOL 10 MG/ML IV BOLUS
INTRAVENOUS | Status: DC | PRN
  Administered 2022-09-24: 200 mg via INTRAVENOUS

## 2022-09-24 MED ORDER — INSULIN GLARGINE-YFGN 100 UNIT/ML ~~LOC~~ SOLN
35.0000 [IU] | Freq: Every day | SUBCUTANEOUS | Status: DC
  Administered 2022-09-25 – 2022-09-27 (×3): 35 [IU] via SUBCUTANEOUS
  Filled 2022-09-24 (×4): qty 0.35

## 2022-09-24 MED ORDER — FLUOXETINE HCL 10 MG PO CAPS: 10.0000 mg | ORAL_CAPSULE | ORAL | Status: DC

## 2022-09-24 MED ORDER — SUCCINYLCHOLINE 20MG/ML (10ML) SYRINGE FOR MEDFUSION PUMP - OPTIME
INTRAMUSCULAR | Status: DC | PRN
  Administered 2022-09-24: 140 mg via INTRAVENOUS

## 2022-09-24 MED ORDER — VITAMIN B-1 100 MG PO TABS: 100.0000 mg | ORAL_TABLET | Freq: Every day | ORAL | Status: DC

## 2022-09-24 MED ORDER — CARVEDILOL 3.125 MG PO TABS
3.1250 mg | ORAL_TABLET | Freq: Two times a day (BID) | ORAL | Status: DC
  Administered 2022-09-24 – 2022-09-25 (×3): 3.125 mg via ORAL
  Filled 2022-09-24 (×4): qty 1

## 2022-09-24 MED ORDER — ONDANSETRON HCL 4 MG/2ML IJ SOLN
4.0000 mg | Freq: Once | INTRAMUSCULAR | Status: AC | PRN
  Administered 2022-09-24: 4 mg via INTRAVENOUS
  Filled 2022-09-24: qty 2

## 2022-09-24 MED ORDER — ALBUTEROL SULFATE (2.5 MG/3ML) 0.083% IN NEBU: 2.5000 mg | INHALATION_SOLUTION | Freq: Four times a day (QID) | RESPIRATORY_TRACT | Status: DC | PRN

## 2022-09-24 MED ORDER — LACTATED RINGERS IV SOLN: INTRAVENOUS | Status: DC | PRN

## 2022-09-24 MED ORDER — STERILE WATER FOR IRRIGATION IR SOLN
Status: DC | PRN
  Administered 2022-09-24: 120 mL

## 2022-09-24 MED ORDER — INSULIN ASPART 100 UNIT/ML IJ SOLN
0.0000 [IU] | Freq: Three times a day (TID) | INTRAMUSCULAR | Status: DC
  Administered 2022-09-25: 4 [IU] via SUBCUTANEOUS
  Administered 2022-09-25 (×2): 3 [IU] via SUBCUTANEOUS
  Administered 2022-09-26: 2 [IU] via SUBCUTANEOUS
  Administered 2022-09-26: 1 [IU] via SUBCUTANEOUS
  Administered 2022-09-26: 3 [IU] via SUBCUTANEOUS
  Administered 2022-09-27 (×2): 2 [IU] via SUBCUTANEOUS
  Administered 2022-09-28: 1 [IU] via SUBCUTANEOUS
  Administered 2022-09-28: 4 [IU] via SUBCUTANEOUS
  Filled 2022-09-24: qty 0.06

## 2022-09-24 MED ORDER — PHENYLEPHRINE HCL (PRESSORS) 10 MG/ML IV SOLN
INTRAVENOUS | Status: DC | PRN
  Administered 2022-09-24: 120 ug via INTRAVENOUS

## 2022-09-24 MED ORDER — FUROSEMIDE 40 MG PO TABS
40.0000 mg | ORAL_TABLET | Freq: Every day | ORAL | Status: DC
  Administered 2022-09-25 – 2022-09-28 (×4): 40 mg via ORAL
  Filled 2022-09-24 (×5): qty 1

## 2022-09-24 MED ORDER — SODIUM CHLORIDE 0.9% FLUSH
3.0000 mL | Freq: Two times a day (BID) | INTRAVENOUS | Status: DC
  Administered 2022-09-24 – 2022-09-26 (×5): 3 mL via INTRAVENOUS

## 2022-09-24 MED ORDER — SODIUM CHLORIDE 0.9 % IV SOLN
50.0000 ug/h | INTRAVENOUS | Status: DC
  Administered 2022-09-24 – 2022-09-28 (×8): 50 ug/h via INTRAVENOUS
  Filled 2022-09-24 (×14): qty 1

## 2022-09-24 MED ORDER — METOCLOPRAMIDE HCL 5 MG/ML IJ SOLN: 10.0000 mg | Freq: Once | INTRAMUSCULAR | Status: DC

## 2022-09-24 MED ORDER — ACETAMINOPHEN 325 MG PO TABS
650.0000 mg | ORAL_TABLET | Freq: Four times a day (QID) | ORAL | Status: DC | PRN
  Administered 2022-09-24 – 2022-09-26 (×2): 650 mg via ORAL
  Filled 2022-09-24 (×2): qty 2

## 2022-09-24 MED ORDER — SODIUM CHLORIDE 0.9% FLUSH: 3.0000 mL | INTRAVENOUS | Status: DC | PRN

## 2022-09-24 MED ORDER — ONE-DAILY MULTI VITAMINS PO TABS: 1.0000 | ORAL_TABLET | Freq: Every day | ORAL | Status: DC

## 2022-09-24 MED ORDER — ALBUTEROL SULFATE (2.5 MG/3ML) 0.083% IN NEBU: 2.5000 mg | INHALATION_SOLUTION | RESPIRATORY_TRACT | Status: DC | PRN

## 2022-09-24 MED ORDER — BISACODYL 10 MG RE SUPP: 10.0000 mg | Freq: Every day | RECTAL | Status: DC | PRN

## 2022-09-24 MED ORDER — SODIUM CHLORIDE 0.9 % IV BOLUS
1000.0000 mL | Freq: Once | INTRAVENOUS | Status: AC
  Administered 2022-09-24: 1000 mL via INTRAVENOUS

## 2022-09-24 MED ORDER — METOCLOPRAMIDE HCL 5 MG/ML IJ SOLN
10.0000 mg | Freq: Once | INTRAMUSCULAR | Status: AC
  Administered 2022-09-24: 10 mg via INTRAVENOUS

## 2022-09-24 MED ORDER — THIAMINE HCL 100 MG/ML IJ SOLN
100.0000 mg | Freq: Every day | INTRAMUSCULAR | Status: DC
  Administered 2022-09-25: 100 mg via INTRAVENOUS
  Filled 2022-09-24: qty 1
  Filled 2022-09-24 (×2): qty 2

## 2022-09-24 MED ORDER — SPIRONOLACTONE 100 MG PO TABS
100.0000 mg | ORAL_TABLET | Freq: Every day | ORAL | Status: DC
  Administered 2022-09-25 – 2022-09-28 (×4): 100 mg via ORAL
  Filled 2022-09-24 (×5): qty 1

## 2022-09-24 MED ORDER — ONDANSETRON HCL 4 MG/2ML IJ SOLN
4.0000 mg | Freq: Four times a day (QID) | INTRAMUSCULAR | Status: DC | PRN
  Administered 2022-09-24: 4 mg via INTRAVENOUS
  Filled 2022-09-24: qty 2

## 2022-09-24 MED ORDER — SODIUM CHLORIDE 0.9% FLUSH
3.0000 mL | Freq: Two times a day (BID) | INTRAVENOUS | Status: DC
  Administered 2022-09-24 – 2022-09-28 (×6): 3 mL via INTRAVENOUS

## 2022-09-24 MED ORDER — METOCLOPRAMIDE HCL 5 MG/ML IJ SOLN
INTRAMUSCULAR | Status: AC
  Filled 2022-09-24: qty 2

## 2022-09-24 MED ORDER — PANTOPRAZOLE SODIUM 40 MG IV SOLR
40.0000 mg | Freq: Once | INTRAVENOUS | Status: AC
  Administered 2022-09-24: 40 mg via INTRAVENOUS
  Filled 2022-09-24: qty 10

## 2022-09-24 MED ORDER — LORAZEPAM 2 MG/ML IJ SOLN
1.0000 mg | INTRAMUSCULAR | Status: DC | PRN
  Administered 2022-09-24: 1 mg via INTRAVENOUS
  Administered 2022-09-24: 2 mg via INTRAVENOUS
  Administered 2022-09-25 (×3): 1 mg via INTRAVENOUS
  Administered 2022-09-26: 2 mg via INTRAVENOUS
  Administered 2022-09-26: 1 mg via INTRAVENOUS
  Filled 2022-09-24 (×7): qty 1

## 2022-09-24 MED ORDER — SUCRALFATE 1 GM/10ML PO SUSP
1.0000 g | Freq: Three times a day (TID) | ORAL | Status: DC
  Administered 2022-09-24 – 2022-09-28 (×15): 1 g via ORAL
  Filled 2022-09-24 (×16): qty 10

## 2022-09-24 MED ORDER — ACETAMINOPHEN 650 MG RE SUPP: 650.0000 mg | Freq: Four times a day (QID) | RECTAL | Status: DC | PRN

## 2022-09-24 MED ORDER — PHYTONADIONE 5 MG PO TABS
5.0000 mg | ORAL_TABLET | Freq: Once | ORAL | Status: AC
  Administered 2022-09-24: 5 mg via ORAL
  Filled 2022-09-24: qty 1

## 2022-09-24 MED ORDER — FLUOXETINE HCL 20 MG PO CAPS
40.0000 mg | ORAL_CAPSULE | Freq: Every day | ORAL | Status: DC
  Administered 2022-09-25 – 2022-09-28 (×4): 40 mg via ORAL
  Filled 2022-09-24 (×5): qty 2

## 2022-09-24 NOTE — Telephone Encounter (Signed)
Patient's significant other called stating patient having swelling and fluid in abdomen and legs. She said patient is not taking lasix anymore and wanted to know what to do.  PA notified about patient and PA directed patient to his GI provider that takes care of his liver disease and if she wasn't able to get in contact with GI provider to take patient to ED.

## 2022-09-24 NOTE — H&P (Signed)
Patient Demographics:    Shawn Velazquez, is a 57 y.o. male  MRN: [id number]   DOB - [birth year]  Admit Date - 09/24/2022  Outpatient Primary MD for the patient is Celene Squibb, MD   Assessment & Plan:   Assessment and Plan: 1) acute GI bleed/hematemesis-- EGD on 09/24/2022 shows Esophageal varices with bleeding stigmata, "status  Post" esophageal band ligation -Previous  EGD on 08/26/2022 with Varices that were banded -IV Protonix and IV octreotide as ordered -GI consult appreciated -Rocephin for SBP prophylaxis in the patient with cirrhosis and GI bleed  2) acute on chronic anemia due to acute blood loss/GI bleed -Please see #1 above -Initial hemoglobin was 10.2 repeat hemoglobin 8.9, hemoglobin was 11.2 on 09/16/2022 -INR is 2.2, it was 7 on 08/29/2022 -Give vitamin K -Monitor H&H and transfuse as clinically indicated  3) alcoholic liver cirrhosis----AST 74 with ALT of 28 consistent with alcoholic liver disease alk phos 238 T. bili 3.9 -Lasix and Aldactone as ordered -Lactulose as ordered -Monitor closely -Please see #2 above  4) ongoing alcohol abuse--- continues to drink daily -High risk for DTs -Benzos thiamine and folic acid and multivitamin as ordered per CIWA protocol  5) hyponatremia--sodium is 132, multifactorial in the setting of alcohol abuse and liver cirrhosis -Avoid excessive free water  6)DM2- Use Novolog/Humalog Sliding scale insulin with Accu-Cheks/Fingersticks as ordered  -Lantus insulin as ordered  7) social/ethics--patient is a full code  Dispo: The patient is from: Home              Anticipated d/c is to: Home              Anticipated d/c date is: 1 day              Patient currently is not medically stable to d/c. Barriers: Not Clinically Stable-    With History of  - Reviewed by me  Past Medical History:  Diagnosis Date   Anxiety    Arthritis    affects hands, shoulder, neck, knees, hips, ankles, toes   Chronic back pain    Diabetes mellitus    diet controlled   Eczema    Fatty liver, alcoholic    GERD (gastroesophageal reflux disease)    Glaucoma    slight case   Hypertension    Psoriasis    Sleep apnea    Substance abuse ([place])    Vertigo 09/11/2017   Wears glasses       Past Surgical History:  Procedure Laterality Date   COLONOSCOPY WITH PROPOFOL N/A 04/05/2022   Procedure: COLONOSCOPY WITH PROPOFOL;  Surgeon: [name], DO;  [facility]: [facility];  Service: Endoscopy;  Laterality: N/A;  10:45 AM,unable to reach pt to move up   [place] 08/26/2022   Procedure: ESOPHAGEAL BANDING;  Surgeon: [name], DO;  Location: [facility];  Service: Endoscopy;  Laterality: N/A;   ESOPHAGOGASTRODUODENOSCOPY (EGD) WITH PROPOFOL N/A  08/26/2022   Procedure: ESOPHAGOGASTRODUODENOSCOPY (EGD) WITH PROPOFOL;  Surgeon: [name], DO;  Location: [facility];  Service: Endoscopy;  Laterality: N/A;   MULTIPLE TOOTH EXTRACTIONS     POLYPECTOMY  04/05/2022   Procedure: POLYPECTOMY;  Surgeon: [name], DO;  Location: [facility];  Service: Endoscopy;;   POSTERIOR CERVICAL LAMINECTOMY  04/21/2012   Procedure: POSTERIOR CERVICAL LAMINECTOMY;  Surgeon: Elaina Hoops, MD;  Location: [facility];  Service: Neurosurgery;  Laterality: Left;  Posterior Cervical laminectomy/foraminotomy and diskectomy, left cervical seven-thoracic one   ROTATOR CUFF REPAIR     THORACIC DISCECTOMY Left 04/03/2014   Procedure: Left Thoracic Seven to Eight, Thoracic Eight to Nine Thoracic Discectomy ;  Surgeon: Elaina Hoops, MD;  Location: [facility];  Service: Neurosurgery;  Laterality: Left;    Chief Complaint  Patient presents with   Emesis      HPI:    Shawn Velazquez  is a 57 y.o. male significant for ongoing alcohol abuse,  alcoholic liver cirrhosis, diabetes mellitus, hypertension, anxiety, tobacco abuse and recurrent GI bleed in the setting of esophageal varices who presents to the ED with hematemesis--patient recently had EGD on 08/26/2022 with Varices that were banded -As per patient hematemesis started around 530 this morning patient had bright red blood in emesis bag in the ED -Patient admits to ongoing daily EtOH use -Initial hemoglobin was 10 point 2 repeat hemoglobin 8.9, hemoglobin was 11.2 on 09/16/2022 -INR is 2.2-1.7 on 08/29/2022 -Sodium is low at 132 potassium is 3.8 creatinine 0.87, AST 74 with ALT of 28 consistent with alcoholic liver disease alk phos 238 T. bili 3.9 No fever  Or chills    Review of systems:    In addition to the HPI above,   A full Review of  Systems was done, all other systems reviewed are negative except as noted above in HPI , .    Social History:  Reviewed by me    Social History   Tobacco Use   Smoking status: Every Day    Packs/day: 0.25    Years: 40.00    Additional pack years: 0.00    Total pack years: 10.00    Types: Cigarettes    Last attempt to quit: 05/09/2022    Years since quitting: 0.3   Smokeless tobacco: Never   Tobacco comments:    pt trying to quit on his own  Substance Use Topics   Alcohol use: Yes    Alcohol/week: 12.0 standard drinks of alcohol    Types: 12 Cans of beer per week    Comment: Either Smirnoff Smash-4 per day or Fireball-10 airplane bottles in a day       Family History :  Reviewed by me    Family History  Problem Relation Age of Onset   Diabetes Father    Cancer - Lung Father    Hypertension Other    Cancer - Lung Other     Home Medications:   Prior to Admission medications   Medication Sig Start Date End Date Taking? Authorizing Provider  acetaminophen (TYLENOL) 325 MG tablet Take 650 mg by mouth as needed for mild pain, headache or moderate pain.    [provider]  albuterol (VENTOLIN HFA) 108 (90  Base) MCG/ACT inhaler Inhale 2 puffs into the lungs every 6 (six) hours as needed for wheezing or shortness of breath. 09/07/22   Sherron Monday, NP  carvedilol (COREG) 3.125 MG tablet Take 1 tablet (3.125  mg total) by mouth 2 (two) times daily with a meal. 08/29/22 10/28/22  Manuella Ghazi, Pratik D, DO  Continuous Blood Gluc Sensor (DEXCOM G7 SENSOR) MISC Inject 1 application. into the skin as directed. Change sensor every 10 days as directed. 12/09/21   Brita Romp, NP  FLUoxetine (PROZAC) 10 MG capsule TAKE 4 CAPSULES BY MOUTH EVERY DAY Patient taking differently: Take 10 mg by mouth See admin instructions. TAKE 2 CAPSULES IN THE MORNING AND 2 CAPSULES EVERY EVENING. 02/17/22   Paseda, Dewaine Conger, FNP  furosemide (LASIX) 40 MG tablet Take 1 tablet (40 mg total) by mouth daily. 09/07/22 09/07/23  Venetia Night L, NP  glucose blood test strip Use as instructed to monitor glucose 4 times daily 12/09/21   Brita Romp, NP  insulin glargine (LANTUS SOLOSTAR) 100 UNIT/ML Solostar Pen Inject 35 Units into the skin at bedtime. 09/09/22   Brita Romp, NP  insulin lispro (HUMALOG KWIKPEN) 100 UNIT/ML KwikPen Inject 10-16 Units into the skin 3 (three) times daily. 09/09/22   Brita Romp, NP  Insulin Pen Needle (PEN NEEDLES) 32G X 4 MM MISC 1 each by Does not apply route in the morning, at noon, in the evening, and at bedtime. Use to inject insulin 4 times daily 05/12/22   Brita Romp, NP  lactulose (CHRONULAC) 10 GM/15ML solution Take 15 mLs (10 g total) by mouth 2 (two) times daily. 08/29/22   Manuella Ghazi, Pratik D, DO  meclizine (ANTIVERT) 12.5 MG tablet Take 12.5 mg by mouth 3 (three) times daily as needed for dizziness or nausea. 08/20/22   [provider]  metFORMIN (GLUCOPHAGE-XR) 500 MG 24 hr tablet Take 2 tablets (1,000 mg total) by mouth 2 (two) times daily with a meal. 08/05/22   Johnson, Clanford L, MD  montelukast (SINGULAIR) 10 MG tablet TAKE 1 TABLET(10 MG) BY MOUTH AT BEDTIME 09/07/22    Sherron Monday, NP  Multiple Vitamin (MULTIVITAMIN) tablet Take 1 tablet by mouth daily.    [provider]  pantoprazole (PROTONIX) 40 MG tablet Take 1 tablet (40 mg total) by mouth 2 (two) times daily. 09/07/22 10/07/22  Sherron Monday, NP  spironolactone (ALDACTONE) 100 MG tablet Take 1 tablet (100 mg total) by mouth daily. 08/06/22   Johnson, Clanford L, MD  sucralfate (CARAFATE) 1 GM/10ML suspension Take 10 mLs (1 g total) by mouth 4 (four) times daily -  with meals and at bedtime for 14 days. 08/29/22 09/12/22  Manuella Ghazi, Pratik D, DO  thiamine (VITAMIN B-1) 100 MG tablet Take 1 tablet (100 mg total) by mouth daily. 08/06/22   Murlean Iba, MD     Allergies:     Allergies  Allergen Reactions   Neosporin + Pain Relief Max St [Neomy-Bacit-Polymyx-Pramoxine] Rash     Physical Exam:   Vitals  Blood pressure 139/80, pulse (!) 122, temperature 98.4 F (36.9 C), resp. rate (!) 23, height 5\' 6"  (1.676 m), weight 92.7 kg, SpO2 98 %.  Physical Examination: General appearance - alert,  in no distress  Mental status - alert, oriented to person, place, and time,  Eyes - sclera anicteric Neck - supple, no JVD elevation , Chest - clear  to auscultation bilaterally, symmetrical air movement,  Heart - S1 and S2 normal, regular  Abdomen - soft,, +BS, gastric discomfort without rebound or guarding Neurological - screening mental status exam normal, neck supple without rigidity, cranial nerves II through XII intact, DTR's normal and symmetric Extremities - +ve pedal edema  noted, intact peripheral pulses  Skin - warm, dry     Data Review:    CBC Recent Labs  Lab 09/24/22 1235 09/24/22 1240 09/24/22 1633  WBC 12.8*  --  14.3*  HGB 9.6* 10.2* 8.9*  HCT 27.9* 30.0* 26.5*  PLT 118*  --  70*  MCV 100.4*  --  100.8*  MCH 34.5*  --  33.8  MCHC 34.4  --  33.6  RDW 14.8  --  14.9    ------------------------------------------------------------------------------------------------------------------  Chemistries  Recent Labs  Lab 09/24/22 1235 09/24/22 1240  NA 132* 135  K 3.8 4.1  CL 94* 93*  CO2 24  --   GLUCOSE 264* 252*  BUN 11 11  CREATININE 0.87 1.00  CALCIUM 7.8*  --   AST 74*  --   ALT 28  --   ALKPHOS 238*  --   BILITOT 3.9*  --    ------------------------------------------------------------------------------------------------------------------ estimated creatinine clearance is 85.9 mL/min (by C-G formula based on SCr of 1 mg/dL). ------------------------------------------------------------------------------------------------------------------  Coagulation profile Recent Labs  Lab 09/24/22 1235  INR 2.2*   -----------------------------------------------------------------------------------------------------------------    Component Value Date/Time   BNP 96.0 08/02/2022 1224   Urinalysis    Component Value Date/Time   COLORURINE YELLOW 08/02/2022 1304   APPEARANCEUR CLEAR 08/02/2022 1304   LABSPEC 1.004 (L) 08/02/2022 1304   PHURINE 7.0 08/02/2022 1304   GLUCOSEU NEGATIVE 08/02/2022 1304   HGBUR NEGATIVE 08/02/2022 1304   BILIRUBINUR NEGATIVE 08/02/2022 1304   BILIRUBINUR negative 07/01/2020 1608   KETONESUR NEGATIVE 08/02/2022 1304   PROTEINUR NEGATIVE 08/02/2022 1304   UROBILINOGEN 0.2 07/01/2020 1608   NITRITE NEGATIVE 08/02/2022 1304   LEUKOCYTESUR NEGATIVE 08/02/2022 1304    ----------------------------------------------------------------------------------------------------------------   Imaging Results:    No results found.  Radiological Exams on Admission: No results found.  DVT Prophylaxis -SCD  AM Labs Ordered, also please review Full Orders  Family Communication: Admission, patients condition and plan of care including tests being ordered have been discussed with the patient  who indicate understanding and agree  with the plan   Condition  -stable  Roxan Hockey M.D on 09/24/2022 at 7:13 PM Go to [email or web address] -  for contact info  [facility] - Office  [phone]

## 2022-09-24 NOTE — Anesthesia Procedure Notes (Addendum)
Procedure Name: Intubation Date/Time: 09/24/2022 3:11 PM  Performed by: Ollen Bowl, CRNAPre-anesthesia Checklist: Patient identified, Patient being monitored, Timeout performed, Emergency Drugs available and Suction available Patient Re-evaluated:Patient Re-evaluated prior to induction Oxygen Delivery Method: Circle System Utilized Preoxygenation: Pre-oxygenation with 100% oxygen Induction Type: IV induction Ventilation: Mask ventilation without difficulty Laryngoscope Size: Mac and 3 Grade View: Grade II Tube type: Oral Tube size: 7.0 mm Number of attempts: 1 Airway Equipment and Method: stylet Placement Confirmation: ETT inserted through vocal cords under direct vision, positive ETCO2 and breath sounds checked- equal and bilateral Secured at: 21 cm Tube secured with: Tape Dental Injury: Teeth and Oropharynx as per pre-operative assessment

## 2022-09-24 NOTE — Telephone Encounter (Signed)
Spoke to pt, he informed me that he was swelling in his stomach and legs and started to vomit blood. He states he is going to the ED.

## 2022-09-24 NOTE — Op Note (Signed)
[facility] Patient Name: Shawn Velazquez Procedure Date: 09/24/2022 2:35 PM MRN: [id number] Date of Birth: 05/04/1966 Attending MD: Norvel Richards , MD, [id number] CSN: [id number] Age: 57 Admit Type: Outpatient Procedure:                Upper GI endoscopy Indications:              Hematemesis Providers:                Norvel Richards, MD, Lambert Mody,                            Crystal Page, Gwynneth Albright RN, RN Referring MD:              Medicines:                General Anesthesia Complications:            No immediate complications. Estimated Blood Loss:     Estimated blood loss: none. Procedure:                Pre-Anesthesia Assessment:                           - Prior to the procedure, a History and Physical                            was performed, and patient medications and                            allergies were reviewed. The patient's tolerance of                            previous anesthesia was also reviewed. The risks                            and benefits of the procedure and the sedation                            options and risks were discussed with the patient.                            All questions were answered, and informed consent                            was obtained. ASA Grade Assessment: IV - A patient                            with severe systemic disease that is a constant                            threat to life. After reviewing the risks and                            benefits, the patient was deemed in satisfactory  condition to undergo the procedure.                           After obtaining informed consent, the endoscope was                            passed under direct vision. Throughout the                            procedure, the patient's blood pressure, pulse, and                            oxygen saturations were monitored continuously. The                            [id number]  [id number]) scope was introduced through the                            mouth, and advanced to the second part of duodenum.                            The upper GI endoscopy was accomplished without                            difficulty. The patient tolerated the procedure                            well. Scope In: 3:13:31 PM Scope Out: 3:25:17 PM Total Procedure Duration: 0 hours 11 minutes 46 seconds  Findings:      4 columns grade 2-3 esophageal varices. Cherry red spots?"1 each on 2       columns. No active bleeding.      Gastric cavity empty. Prominent changes of portal hypertensive       gastropathy with touch friability. No gastric varices. Patent pylorus.      The duodenal bulb and second portion of the duodenum were normal.      Scope removed. Microvasive 7 shot bander patch. Scope reintroduced in       the esophagus. 1 band put on each column. They were applied in helical       fashion beginning just above the EG junction. Good hemostasis maintained. Impression:               - Esophageal varices with bleeding stigmata?"status                            post esophageal band ligation                           -Normal first and second portion of the duodenum                           - No specimens collected. Patient extubated in Endo                            room 3. Moderate Sedation:  Moderate (conscious) sedation was personally administered by an       anesthesia professional. The following parameters were monitored: oxygen       saturation, heart rate, blood pressure, respiratory rate, EKG, adequacy       of pulmonary ventilation, and response to care. Recommendation:           - Return patient to ICU for ongoing care.                           - Clear liquid diet.                           - Continue present medications. Continue                            Sandostatin x 72 hours. Continue PPI. Repeat                            banding session in 4 weeks/at patient  request, I                            called Shawn Velazquez, sister, at                            [phone] findings and recommendations                            in detail. Questions answered. Procedure Code(s):        --- Professional ---                           [phone], Esophagogastroduodenoscopy, flexible,                            transoral; diagnostic, including collection of                            specimen(s) by brushing or washing, when performed                            (separate procedure) Diagnosis Code(s):        --- Professional ---                           K92.0, Hematemesis CPT copyright 2022 [employer]. All rights reserved. The codes documented in this report are preliminary and upon coder review may  be revised to meet current compliance requirements. Cristopher Estimable. Josearmando Kuhnert, MD Norvel Richards, MD 09/24/2022 3:42:25 PM This report has been signed electronically. Number of Addenda: 0

## 2022-09-24 NOTE — Transfer of Care (Signed)
Immediate Anesthesia Transfer of Care Note  Patient: Shawn Velazquez  Procedure(s) Performed: ESOPHAGOGASTRODUODENOSCOPY (EGD) WITH PROPOFOL ESOPHAGEAL BANDING  Patient Location: PACU  Anesthesia Type:General  Level of Consciousness: awake  Airway & Oxygen Therapy: Patient Spontanous Breathing  Post-op Assessment: Report given to RN  Post vital signs: Reviewed and stable  Last Vitals:  Vitals Value Taken Time  BP 157/91 09/24/22 1545  Temp    Pulse 118 09/24/22 1549  Resp 23 09/24/22 1549  SpO2 100 % 09/24/22 1549  Vitals shown include unvalidated device data.  Last Pain:  Vitals:   09/24/22 1506  TempSrc:   PainSc: 8          Complications: No notable events documented.

## 2022-09-24 NOTE — ED Triage Notes (Signed)
Pt states he has been vomiting blood since 0530 this morning.  Bright red blood noted in emesis bag.  States he has history of this & has esophogeal bands. States he drinks alcohol daily.

## 2022-09-24 NOTE — ED Provider Notes (Signed)
[facility] Provider Note   CSN: [id number] Arrival date & time: 09/24/22  1210     History  Chief Complaint  Patient presents with   Emesis    Shawn Velazquez is a 57 y.o. male.  Patient presents with vomiting blood.  Patient has a history of varices have been banded.  Patient continues to drink alcohol  The history is provided by the patient and medical records. No language interpreter was used.  Emesis Severity:  Moderate Timing:  Constant Quality:  Bright red blood Able to tolerate:  Liquids Progression:  Worsening Chronicity:  Recurrent Recent urination:  Normal Context: not post-tussive   Relieved by:  Nothing Worsened by:  Nothing Associated symptoms: no abdominal pain, no cough, no diarrhea and no headaches        Home Medications Prior to Admission medications   Medication Sig Start Date End Date Taking? Authorizing Provider  acetaminophen (TYLENOL) 325 MG tablet Take 650 mg by mouth as needed for mild pain, headache or moderate pain.    [provider]  albuterol (VENTOLIN HFA) 108 (90 Base) MCG/ACT inhaler Inhale 2 puffs into the lungs every 6 (six) hours as needed for wheezing or shortness of breath. 09/07/22   Sherron Monday, NP  carvedilol (COREG) 3.125 MG tablet Take 1 tablet (3.125 mg total) by mouth 2 (two) times daily with a meal. 08/29/22 10/28/22  Manuella Ghazi, Pratik D, DO  Continuous Blood Gluc Sensor (DEXCOM G7 SENSOR) MISC Inject 1 application. into the skin as directed. Change sensor every 10 days as directed. 12/09/21   Brita Romp, NP  FLUoxetine (PROZAC) 10 MG capsule TAKE 4 CAPSULES BY MOUTH EVERY DAY Patient taking differently: Take 10 mg by mouth See admin instructions. TAKE 2 CAPSULES IN THE MORNING AND 2 CAPSULES EVERY EVENING. 02/17/22   Paseda, Dewaine Conger, FNP  furosemide (LASIX) 40 MG tablet Take 1 tablet (40 mg total) by mouth daily. 09/07/22 09/07/23  Venetia Night L, NP  glucose blood test strip Use as instructed to  monitor glucose 4 times daily 12/09/21   Brita Romp, NP  insulin glargine (LANTUS SOLOSTAR) 100 UNIT/ML Solostar Pen Inject 35 Units into the skin at bedtime. 09/09/22   Brita Romp, NP  insulin lispro (HUMALOG KWIKPEN) 100 UNIT/ML KwikPen Inject 10-16 Units into the skin 3 (three) times daily. 09/09/22   Brita Romp, NP  Insulin Pen Needle (PEN NEEDLES) 32G X 4 MM MISC 1 each by Does not apply route in the morning, at noon, in the evening, and at bedtime. Use to inject insulin 4 times daily 05/12/22   Brita Romp, NP  lactulose (CHRONULAC) 10 GM/15ML solution Take 15 mLs (10 g total) by mouth 2 (two) times daily. 08/29/22   Manuella Ghazi, Pratik D, DO  meclizine (ANTIVERT) 12.5 MG tablet Take 12.5 mg by mouth 3 (three) times daily as needed for dizziness or nausea. 08/20/22   [provider]  metFORMIN (GLUCOPHAGE-XR) 500 MG 24 hr tablet Take 2 tablets (1,000 mg total) by mouth 2 (two) times daily with a meal. 08/05/22   Johnson, Clanford L, MD  montelukast (SINGULAIR) 10 MG tablet TAKE 1 TABLET(10 MG) BY MOUTH AT BEDTIME 09/07/22   Sherron Monday, NP  Multiple Vitamin (MULTIVITAMIN) tablet Take 1 tablet by mouth daily.    [provider]  pantoprazole (PROTONIX) 40 MG tablet Take 1 tablet (40 mg total) by mouth 2 (two) times daily. 09/07/22 10/07/22  Sherron Monday, NP  spironolactone (ALDACTONE) 100 MG tablet Take 1 tablet (100 mg total) by mouth daily. 08/06/22   Johnson, Clanford L, MD  sucralfate (CARAFATE) 1 GM/10ML suspension Take 10 mLs (1 g total) by mouth 4 (four) times daily -  with meals and at bedtime for 14 days. 08/29/22 09/12/22  Manuella Ghazi, Pratik D, DO  thiamine (VITAMIN B-1) 100 MG tablet Take 1 tablet (100 mg total) by mouth daily. 08/06/22   Johnson, Clanford L, MD      Allergies    Neosporin + pain relief max st [neomy-bacit-polymyx-pramoxine]    Review of Systems   Review of Systems  Constitutional:  Negative for appetite change and fatigue.  HENT:   Negative for congestion, ear discharge and sinus pressure.   Eyes:  Negative for discharge.  Respiratory:  Negative for cough.   Cardiovascular:  Negative for chest pain.  Gastrointestinal:  Positive for vomiting. Negative for abdominal pain and diarrhea.       Vomiting blood  Genitourinary:  Negative for frequency and hematuria.  Musculoskeletal:  Negative for back pain.  Skin:  Negative for rash.  Neurological:  Negative for seizures and headaches.  Psychiatric/Behavioral:  Negative for hallucinations.     Physical Exam Updated Vital Signs BP (!) 141/89   Pulse (!) 122   Temp 98.4 F (36.9 C)   Resp (!) 23   Ht 5\' 6"  (1.676 m)   Wt 83.9 kg   SpO2 98%   BMI 29.85 kg/m  Physical Exam Vitals and nursing note reviewed.  Constitutional:      Appearance: He is well-developed.  HENT:     Head: Normocephalic.     Mouth/Throat:     Mouth: Mucous membranes are moist.  Eyes:     General: No scleral icterus.    Conjunctiva/sclera: Conjunctivae normal.  Neck:     Thyroid: No thyromegaly.  Cardiovascular:     Rate and Rhythm: Normal rate and regular rhythm.     Heart sounds: No murmur heard.    No friction rub. No gallop.  Pulmonary:     Breath sounds: No stridor. No wheezing or rales.  Chest:     Chest wall: No tenderness.  Abdominal:     General: There is no distension.     Tenderness: There is abdominal tenderness. There is no rebound.  Musculoskeletal:        General: Normal range of motion.     Cervical back: Neck supple.  Lymphadenopathy:     Cervical: No cervical adenopathy.  Skin:    Findings: No erythema or rash.  Neurological:     Mental Status: He is alert and oriented to person, place, and time.     Motor: No abnormal muscle tone.     Coordination: Coordination normal.  Psychiatric:        Behavior: Behavior normal.     ED Results / Procedures / Treatments   Labs (all labs ordered are listed, but only abnormal results are displayed) Labs Reviewed   COMPREHENSIVE METABOLIC PANEL - Abnormal; Notable for the following components:      Result Value   Sodium 132 (*)    Chloride 94 (*)    Glucose, Bld 264 (*)    Calcium 7.8 (*)    Total Protein 5.7 (*)    Albumin 2.3 (*)    AST 74 (*)    Alkaline Phosphatase 238 (*)    Total Bilirubin 3.9 (*)    All other components within normal limits  CBC -  Abnormal; Notable for the following components:   WBC 12.8 (*)    RBC 2.78 (*)    Hemoglobin 9.6 (*)    HCT 27.9 (*)    MCV 100.4 (*)    MCH 34.5 (*)    Platelets 118 (*)    All other components within normal limits  PROTIME-INR - Abnormal; Notable for the following components:   Prothrombin Time 24.4 (*)    INR 2.2 (*)    All other components within normal limits  CBC - Abnormal; Notable for the following components:   WBC 14.3 (*)    RBC 2.63 (*)    Hemoglobin 8.9 (*)    HCT 26.5 (*)    MCV 100.8 (*)    Platelets 70 (*)    All other components within normal limits  GLUCOSE, CAPILLARY - Abnormal; Notable for the following components:   Glucose-Capillary 255 (*)    All other components within normal limits  I-STAT CHEM 8, ED - Abnormal; Notable for the following components:   Chloride 93 (*)    Glucose, Bld 252 (*)    Calcium, Ion 0.94 (*)    Hemoglobin 10.2 (*)    HCT 30.0 (*)    All other components within normal limits  LIPASE, BLOOD  URINALYSIS, ROUTINE W REFLEX MICROSCOPIC  CBC  COMPREHENSIVE METABOLIC PANEL  PROTIME-INR  POC OCCULT BLOOD, ED  TYPE AND SCREEN  PREPARE RBC (CROSSMATCH)    EKG None  Radiology No results found.  Procedures Procedures    Medications Ordered in ED Medications  octreotide (SANDOSTATIN) 2 mcg/mL load via infusion 100 mcg (100 mcg Intravenous Bolus from Bag 09/24/22 1329)    And  octreotide (SANDOSTATIN) 500 mcg in sodium chloride 0.9 % 250 mL (2 mcg/mL) infusion (50 mcg/hr Intravenous New Bag/Given 09/24/22 1331)  pantoprazole (PROTONIX) injection 40 mg ( Intravenous  Automatically Held 10/02/22 2200)  cefTRIAXone (ROCEPHIN) 2 g in sodium chloride 0.9 % 100 mL IVPB ( Intravenous Automatically Held 10/03/22 1000)  0.9 %  sodium chloride infusion (Manually program via Guardrails IV Fluids) ( Intravenous MAR Hold 09/24/22 1430)  LORazepam (ATIVAN) tablet 1-4 mg (has no administration in time range)    Or  LORazepam (ATIVAN) injection 1-4 mg (has no administration in time range)  thiamine (Vitamin B-1) tablet 100 mg (has no administration in time range)    Or  thiamine (VITAMIN B1) injection 100 mg (has no administration in time range)  folic acid (FOLVITE) tablet 1 mg (has no administration in time range)  multivitamin with minerals tablet 1 tablet (has no administration in time range)  carvedilol (COREG) tablet 3.125 mg (has no administration in time range)  furosemide (LASIX) tablet 40 mg (has no administration in time range)  spironolactone (ALDACTONE) tablet 100 mg (has no administration in time range)  insulin glargine-yfgn (SEMGLEE) injection 35 Units (has no administration in time range)  lactulose (CHRONULAC) 10 GM/15ML solution 10 g (has no administration in time range)  sucralfate (CARAFATE) 1 GM/10ML suspension 1 g (has no administration in time range)  sodium chloride flush (NS) 0.9 % injection 3 mL (has no administration in time range)  sodium chloride flush (NS) 0.9 % injection 3 mL (has no administration in time range)  sodium chloride flush (NS) 0.9 % injection 3 mL (has no administration in time range)  0.9 %  sodium chloride infusion (has no administration in time range)  acetaminophen (TYLENOL) tablet 650 mg (has no administration in time range)    Or  acetaminophen (TYLENOL) suppository 650 mg (has no administration in time range)  polyethylene glycol (MIRALAX / GLYCOLAX) packet 17 g (has no administration in time range)  bisacodyl (DULCOLAX) suppository 10 mg (has no administration in time range)  ondansetron (ZOFRAN) tablet 4 mg (has no  administration in time range)    Or  ondansetron (ZOFRAN) injection 4 mg (has no administration in time range)  albuterol (PROVENTIL) (2.5 MG/3ML) 0.083% nebulizer solution 2.5 mg (has no administration in time range)  insulin aspart (novoLOG) injection 0-6 Units (has no administration in time range)  insulin aspart (novoLOG) injection 0-5 Units (has no administration in time range)  FLUoxetine (PROZAC) capsule 40 mg (has no administration in time range)  ondansetron (ZOFRAN) injection 4 mg (4 mg Intravenous Given 09/24/22 1240)  pantoprazole (PROTONIX) injection 40 mg (40 mg Intravenous Given 09/24/22 1242)  sodium chloride 0.9 % bolus 1,000 mL (1,000 mLs Intravenous Bolus from Bag 09/24/22 1240)  cefTRIAXone (ROCEPHIN) 2 g in sodium chloride 0.9 % 100 mL IVPB (0 g Intravenous Stopped 09/24/22 1343)  metoCLOPramide (REGLAN) injection 10 mg (10 mg Intravenous Given 09/24/22 1440)    ED Course/ Medical Decision Making/ A&P  CRITICAL CARE Performed by: Milton Ferguson Total critical care time: 45 minutes Critical care time was exclusive of separately billable procedures and treating other patients. Critical care was necessary to treat or prevent imminent or life-threatening deterioration. Critical care was time spent personally by me on the following activities: development of treatment plan with patient and/or surrogate as well as nursing, discussions with consultants, evaluation of patient's response to treatment, examination of patient, obtaining history from patient or surrogate, ordering and performing treatments and interventions, ordering and review of laboratory studies, ordering and review of radiographic studies, pulse oximetry and re-evaluation of patient's condition.   Patient vomiting blood.  GI has been called and will consult and arrange endoscopy with medicine admission                           Medical Decision Making Amount and/or Complexity of Data Reviewed Labs:  ordered.  Risk Prescription drug management. Decision regarding hospitalization.  This patient presents to the ED for concern of vomiting blood, this involves an extensive number of treatment options, and is a complaint that carries with it a high risk of complications and morbidity.  The differential diagnosis includes gastritis, esophageal varices   Co morbidities that complicate the patient evaluation  Cirrhosis   Additional history obtained:  Additional history obtained from patient External records from outside source obtained and reviewed including hospital records   Lab Tests:  I Ordered, and personally interpreted labs.  The pertinent results include: Glucose 264, hemoglobin 10.2   Imaging Studies ordered: No imaging Cardiac Monitoring: / EKG:  The patient was maintained on a cardiac monitor.  I personally viewed and interpreted the cardiac monitored which showed an underlying rhythm of: Sinus tach   Consultations Obtained:  I requested consultation with the GI and hospitalist,  and discussed lab and imaging findings as well as pertinent plan - they recommend: Hospitalist admit with GI doing endoscopy   Problem List / ED Course / Critical interventions / Medication management  Cirrhosis and varices I ordered medication including octreotide, Protonix Reevaluation of the patient after these medicines showed that the patient improved I have reviewed the patients home medicines and have made adjustments as needed   Social Determinants of Health:  Continues alcohol use   Test /  Admission - Considered:  None  Upper GI bleed from varices        Final Clinical Impression(s) / ED Diagnoses Final diagnoses:  None    Rx / DC Orders ED Discharge Orders     None         Milton Ferguson, MD 09/24/22 1737

## 2022-09-24 NOTE — Anesthesia Preprocedure Evaluation (Signed)
Anesthesia Evaluation  Patient identified by MRN, date of birth, ID band Patient awake    Reviewed: Allergy & Precautions, H&P , NPO status , Patient's Chart, lab work & pertinent test results  Airway Mallampati: II  TM Distance: >3 FB Neck ROM: Full    Dental  (+) Edentulous Upper, Edentulous Lower   Pulmonary sleep apnea and Continuous Positive Airway Pressure Ventilation , Current Smoker and Patient abstained from smoking.   Pulmonary exam normal breath sounds clear to auscultation       Cardiovascular Exercise Tolerance: Good hypertension, Pt. on medications Normal cardiovascular exam Rhythm:Regular Rate:Normal  24-Sep-2022 12:31:51 [facility]-ER ROUTINE RECORD 17-Jan-1966 (71 yr) Male Caucasian Vent. rate 136 BPM PR interval 133 ms QRS duration 110 ms QT/QTcB 322/485 ms P-R-T axes 35 -8 69 Sinus tachycardia Borderline repolarization abnormality Borderline prolonged QT interval   Neuro/Psych  PSYCHIATRIC DISORDERS Anxiety Depression    negative neurological ROS     GI/Hepatic ,GERD  Medicated and Controlled,,(+) Cirrhosis   Esophageal Varices  substance abuse  alcohol use  Endo/Other  diabetes, Well Controlled, Type 2, Insulin Dependent    Renal/GU negative Renal ROS  negative genitourinary   Musculoskeletal  (+) Arthritis , Osteoarthritis,    Abdominal   Peds negative pediatric ROS (+)  Hematology  (+) Blood dyscrasia, anemia   Anesthesia Other Findings   Reproductive/Obstetrics negative OB ROS                              Anesthesia Physical Anesthesia Plan  ASA: 4 and emergent  Anesthesia Plan: General   Post-op Pain Management: Minimal or no pain anticipated   Induction: Intravenous, Rapid sequence and Cricoid pressure planned  PONV Risk Score and Plan: 1 and Ondansetron, Dexamethasone and Metaclopromide  Airway Management Planned: Oral  ETT  Additional Equipment:   Intra-op Plan:   Post-operative Plan: Extubation in OR and Possible Post-op intubation/ventilation  Informed Consent: I have reviewed the patients History and Physical, chart, labs and discussed the procedure including the risks, benefits and alternatives for the proposed anesthesia with the patient or authorized representative who has indicated his/her understanding and acceptance.     Dental advisory given  Plan Discussed with: CRNA and Surgeon  Anesthesia Plan Comments:          Anesthesia Quick Evaluation

## 2022-09-24 NOTE — Anesthesia Postprocedure Evaluation (Signed)
Anesthesia Post Note  Patient: AYDREN REGISTER  Procedure(s) Performed: ESOPHAGOGASTRODUODENOSCOPY (EGD) WITH PROPOFOL ESOPHAGEAL BANDING  Patient location during evaluation: PACU Anesthesia Type: General Level of consciousness: awake and alert and oriented Pain management: pain level controlled Vital Signs Assessment: post-procedure vital signs reviewed and stable Respiratory status: spontaneous breathing, nonlabored ventilation, respiratory function stable and patient connected to nasal cannula oxygen Cardiovascular status: blood pressure returned to baseline and stable Postop Assessment: no apparent nausea or vomiting Anesthetic complications: no  No notable events documented.   Last Vitals:  Vitals:   09/24/22 1600 09/24/22 1615  BP: (!) 144/69 (!) 143/80  Pulse: (!) 115   Resp: 18 17  Temp:    SpO2: 100%     Last Pain:  Vitals:   09/24/22 1545  TempSrc:   PainSc: 8                  Annitta Fifield C [place]

## 2022-09-24 NOTE — Consult Note (Signed)
Gastroenterology Consult   Referring Provider: No ref. provider found Primary Care Physician:  Celene Squibb, MD Primary Gastroenterologist:  Dr. Abbey Chatters  Patient ID: Shawn Velazquez; [id number]; 1965/08/29   Admit date: 09/24/2022  LOS: 0 days   Date of Consultation: 09/24/2022  Reason for Consultation: hematemesis  History of Present Illness   Shawn Velazquez is a 57 y.o. year old male with history of decompensated alcoholic cirrhosis with ongoing dependence, alcohlic hepatitis, sleep apnea, DM, HTN who presented to the ED with hematemesis.   Admission for anasarca in January 2024 and recent admission late February for hematemesis and melena. Underwent EGD with banding of grade 2 esophageal varices. Discharged 08/29/22.   ED Course: Labs: WBC 12.8, hemoglobin 9.6 (down from 11.2 eight days ago), MCV 100.4, platelets 118, sodium 132, creatinine 0.87, albumin 2.3, AST 74, ALT 28, alk phos 238, T. bili 3.9, INR 2.2, lipase 50 Octreotide infusion started.  Given Rocephin 2 g IV. BP stable with tachycardia with heart rate up to the 120s.  Patient reports he started vomiting bloody emesis about 5 AM this morning and has had multiple episodes at home and about 3-4 in the ED.  Also with large amounts of melanotic stool.  Has continued to have intermittent BRBPR however he does have hemorrhoids which was noted on his most recent colonoscopy in October 2023.  Does endorse some occasional heartburn but denies any dysphagia.  Has continued to drink alcohol.  Noted at his last office visit that he had only had (2 )16 ounce alcoholic drinks in a few weeks however he admits that recently has continued to drink (2) 60 ounce beers on a daily basis.  Continues to have lower extremity edema and abdominal ascension that is slightly worsened from prior.  Further discussion appears that he possibly has been out of his Lasix for an unknown period of time since his last office visit.  He reports he continues  to take all of his other medications as prescribed including taking lactulose once per day and having 2-3 bowel movements daily at home.  Does have some mild tremors and intermittent confusion but is no worse than previously.  Does have some mild abdominal pain related to his repeated vomiting.  No jaundice.  States he does not want to deal with this pain or this recurrent vomiting anymore and is wanting to quit drinking alcohol.  Advised him that we could potentially have social work provide him with some local resources for alcohol cessation.   EGD 08/26/2022: -grade 2 esophageal varices s/p banding x 3 with complete eradication, without bleeding. -Single nonbleeding angiodysplastic lesion in the duodenum.   Colonoscopy 04/05/2022: -Preparation of the colon was fair. -Non-bleeding internal hemorrhoids. -Two 2 to 3 mm polyps in the cecum, removed with a cold snare. Resected and retrieved. -Three 4 to 8 mm polyps in the transverse colon, removed with a cold snare. Resected and retrieved. -One 8 mm polyp in the descending colon, removed with a cold snare. Resected and retrieved. -The examination was otherwise normal. -Tubular adenomas -Three year surveillance.   MRI liver with and without contrast 08/03/2022: -Chronic liver disease -Macronodular liver with multiple presumed regenerative nodules -No clear aggressive appearing lesions with restricted diffusion -Mild ascites -Varices on the stomach and lower esophagus -Mild splenic enlargement   Past Medical History:  Diagnosis Date   Anxiety    Arthritis    affects hands, shoulder, neck, knees, hips, ankles, toes   Chronic back pain  Diabetes mellitus    diet controlled   Eczema    Fatty liver, alcoholic    GERD (gastroesophageal reflux disease)    Glaucoma    slight case   Hypertension    Psoriasis    Sleep apnea    Substance abuse ([place])    Vertigo 09/11/2017   Wears glasses     Past Surgical History:  Procedure  Laterality Date   COLONOSCOPY WITH PROPOFOL N/A 04/05/2022   Procedure: COLONOSCOPY WITH PROPOFOL;  Surgeon: [name], DO;  [facility];  Service: Endoscopy;  Laterality: N/A;  10:45 AM,unable to reach pt to move up   [place] 08/26/2022   Procedure: ESOPHAGEAL BANDING;  Surgeon: [name], DO;  Location: [facility];  Service: Endoscopy;  Laterality: N/A;   ESOPHAGOGASTRODUODENOSCOPY (EGD) WITH PROPOFOL N/A 08/26/2022   Procedure: ESOPHAGOGASTRODUODENOSCOPY (EGD) WITH PROPOFOL;  Surgeon: [name], DO;  [facility]: [facility];  Service: Endoscopy;  Laterality: N/A;   MULTIPLE TOOTH EXTRACTIONS     POLYPECTOMY  04/05/2022   Procedure: POLYPECTOMY;  Surgeon: [name], DO;  Location: [facility];  Service: Endoscopy;;   POSTERIOR CERVICAL LAMINECTOMY  04/21/2012   Procedure: POSTERIOR CERVICAL LAMINECTOMY;  Surgeon: Elaina Hoops, MD;  Location: [place] [facility];  Service: Neurosurgery;  Laterality: Left;  Posterior Cervical laminectomy/foraminotomy and diskectomy, left cervical seven-thoracic one   ROTATOR CUFF REPAIR     THORACIC DISCECTOMY Left 04/03/2014   Procedure: Left Thoracic Seven to Eight, Thoracic Eight to Nine Thoracic Discectomy ;  Surgeon: Elaina Hoops, MD;  Location: [place] [facility];  Service: Neurosurgery;  Laterality: Left;    Prior to Admission medications   Medication Sig Start Date End Date Taking? Authorizing Provider  acetaminophen (TYLENOL) 325 MG tablet Take 650 mg by mouth as needed for mild pain, headache or moderate pain.    [provider]  albuterol (VENTOLIN HFA) 108 (90 Base) MCG/ACT inhaler Inhale 2 puffs into the lungs every 6 (six) hours as needed for wheezing or shortness of breath. 09/07/22   Sherron Monday, NP  carvedilol (COREG) 3.125 MG tablet Take 1 tablet (3.125 mg total) by mouth 2 (two) times daily with a meal. 08/29/22 10/28/22  Manuella Ghazi, Pratik D, DO  Continuous Blood Gluc Sensor (DEXCOM G7 SENSOR)  MISC Inject 1 application. into the skin as directed. Change sensor every 10 days as directed. 12/09/21   Brita Romp, NP  FLUoxetine (PROZAC) 10 MG capsule TAKE 4 CAPSULES BY MOUTH EVERY DAY Patient taking differently: Take 10 mg by mouth See admin instructions. TAKE 2 CAPSULES IN THE MORNING AND 2 CAPSULES EVERY EVENING. 02/17/22   P[place], Dewaine Conger, FNP  furosemide (LASIX) 40 MG tablet Take 1 tablet (40 mg total) by mouth daily. 09/07/22 09/07/23  Venetia Night L, NP  glucose blood test strip Use as instructed to monitor glucose 4 times daily 12/09/21   Brita Romp, NP  insulin glargine (LANTUS SOLOSTAR) 100 UNIT/ML Solostar Pen Inject 35 Units into the skin at bedtime. 09/09/22   Brita Romp, NP  insulin lispro (HUMALOG KWIKPEN) 100 UNIT/ML KwikPen Inject 10-16 Units into the skin 3 (three) times daily. 09/09/22   Brita Romp, NP  Insulin Pen Needle (PEN NEEDLES) 32G X 4 MM MISC 1 each by Does not apply route in the morning, at noon, in the evening, and at bedtime. Use to inject insulin 4 times daily 05/12/22   Brita Romp, NP  lactulose (CHRONULAC) 10 GM/15ML solution Take 15 mLs (10 g total) by mouth 2 (two) times daily. 08/29/22   Manuella Ghazi, Pratik D, DO  meclizine (ANTIVERT) 12.5 MG tablet Take 12.5 mg by mouth 3 (three) times daily as needed for dizziness or nausea. 08/20/22   [provider]  metFORMIN (GLUCOPHAGE-XR) 500 MG 24 hr tablet Take 2 tablets (1,000 mg total) by mouth 2 (two) times daily with a meal. 08/05/22   Johnson, Clanford L, MD  montelukast (SINGULAIR) 10 MG tablet TAKE 1 TABLET(10 MG) BY MOUTH AT BEDTIME 09/07/22   Sherron Monday, NP  Multiple Vitamin (MULTIVITAMIN) tablet Take 1 tablet by mouth daily.    [provider]  pantoprazole (PROTONIX) 40 MG tablet Take 1 tablet (40 mg total) by mouth 2 (two) times daily. 09/07/22 10/07/22  Sherron Monday, NP  spironolactone (ALDACTONE) 100 MG tablet Take 1 tablet (100 mg total) by mouth daily.  08/06/22   Johnson, Clanford L, MD  sucralfate (CARAFATE) 1 GM/10ML suspension Take 10 mLs (1 g total) by mouth 4 (four) times daily -  with meals and at bedtime for 14 days. 08/29/22 09/12/22  Manuella Ghazi, Pratik D, DO  thiamine (VITAMIN B-1) 100 MG tablet Take 1 tablet (100 mg total) by mouth daily. 08/06/22   Murlean Iba, MD    Current Facility-Administered Medications  Medication Dose Route Frequency Provider Last Rate Last Admin   cefTRIAXone (ROCEPHIN) 2 g in sodium chloride 0.9 % 100 mL IVPB  2 g Intravenous Once Milton Ferguson, MD       octreotide (SANDOSTATIN) 2 mcg/mL load via infusion 100 mcg  100 mcg Intravenous Once Milton Ferguson, MD       And   octreotide (SANDOSTATIN) 500 mcg in sodium chloride 0.9 % 250 mL (2 mcg/mL) infusion  50 mcg/hr Intravenous Continuous Milton Ferguson, MD       Current Outpatient Medications  Medication Sig Dispense Refill   acetaminophen (TYLENOL) 325 MG tablet Take 650 mg by mouth as needed for mild pain, headache or moderate pain.     albuterol (VENTOLIN HFA) 108 (90 Base) MCG/ACT inhaler Inhale 2 puffs into the lungs every 6 (six) hours as needed for wheezing or shortness of breath. 20.1 g 0   carvedilol (COREG) 3.125 MG tablet Take 1 tablet (3.125 mg total) by mouth 2 (two) times daily with a meal. 60 tablet 1   Continuous Blood Gluc Sensor (DEXCOM G7 SENSOR) MISC Inject 1 application. into the skin as directed. Change sensor every 10 days as directed. 6 each 3   FLUoxetine (PROZAC) 10 MG capsule TAKE 4 CAPSULES BY MOUTH EVERY DAY (Patient taking differently: Take 10 mg by mouth See admin instructions. TAKE 2 CAPSULES IN THE MORNING AND 2 CAPSULES EVERY EVENING.) 260 capsule 0   furosemide (LASIX) 40 MG tablet Take 1 tablet (40 mg total) by mouth daily. 30 tablet 1   glucose blood test strip Use as instructed to monitor glucose 4 times daily 100 each 12   insulin glargine (LANTUS SOLOSTAR) 100 UNIT/ML Solostar Pen Inject 35 Units into the skin at bedtime.  30 mL 3   insulin lispro (HUMALOG KWIKPEN) 100 UNIT/ML KwikPen Inject 10-16 Units into the skin 3 (three) times daily. 40 mL 3   Insulin Pen Needle (PEN NEEDLES) 32G X 4 MM MISC 1 each by Does not apply route in the morning, at noon, in the evening, and at bedtime. Use to inject insulin 4 times daily 200 each 3  lactulose (CHRONULAC) 10 GM/15ML solution Take 15 mLs (10 g total) by mouth 2 (two) times daily. 236 mL 0   meclizine (ANTIVERT) 12.5 MG tablet Take 12.5 mg by mouth 3 (three) times daily as needed for dizziness or nausea.     metFORMIN (GLUCOPHAGE-XR) 500 MG 24 hr tablet Take 2 tablets (1,000 mg total) by mouth 2 (two) times daily with a meal. 180 tablet 1   montelukast (SINGULAIR) 10 MG tablet TAKE 1 TABLET(10 MG) BY MOUTH AT BEDTIME 30 tablet 3   Multiple Vitamin (MULTIVITAMIN) tablet Take 1 tablet by mouth daily.     pantoprazole (PROTONIX) 40 MG tablet Take 1 tablet (40 mg total) by mouth 2 (two) times daily. 60 tablet 0   spironolactone (ALDACTONE) 100 MG tablet Take 1 tablet (100 mg total) by mouth daily. 30 tablet 1   sucralfate (CARAFATE) 1 GM/10ML suspension Take 10 mLs (1 g total) by mouth 4 (four) times daily -  with meals and at bedtime for 14 days. 560 mL 0   thiamine (VITAMIN B-1) 100 MG tablet Take 1 tablet (100 mg total) by mouth daily. 30 tablet 1    Allergies as of 09/24/2022 - Unable to Assess 09/24/2022  Allergen Reaction Noted   Neosporin + pain relief max st [neomy-bacit-polymyx-pramoxine] Rash 04/19/2012    Family History  Problem Relation Age of Onset   Diabetes Father    Cancer - Lung Father    Hypertension Other    Cancer - Lung Other     Social History   Socioeconomic History   Marital status: Divorced    Spouse name: Not on file   Number of children: Not on file   Years of education: Not on file   Highest education level: Not on file  Occupational History    Comment: Truck to storage  Tobacco Use   Smoking status: Every Day    Packs/day:  0.25    Years: 40.00    Additional pack years: 0.00    Total pack years: 10.00    Types: Cigarettes    Last attempt to quit: 05/09/2022    Years since quitting: 0.3   Smokeless tobacco: Never   Tobacco comments:    pt trying to quit on his own  Vaping Use   Vaping Use: Former  Substance and Sexual Activity   Alcohol use: Yes    Alcohol/week: 12.0 standard drinks of alcohol    Types: 12 Cans of beer per week    Comment: Either Smirnoff Smash-4 per day or Fireball-10 airplane bottles in a day   Drug use: No   Sexual activity: Yes    Birth control/protection: None  Other Topics Concern   Not on file  Social History Narrative   Not on file   Social Determinants of Health   Financial Resource Strain: Not on file  Food Insecurity: No Food Insecurity (08/31/2022)   Hunger Vital Sign    Worried About Running Out of Food in the Last Year: Never true    Ran Out of Food in the Last Year: Never true  Transportation Needs: No Transportation Needs (08/31/2022)   PRAPARE - Hydrologist (Medical): No    Lack of Transportation (Non-Medical): No  Physical Activity: Not on file  Stress: Not on file  Social Connections: Not on file  Intimate Partner Violence: Not At Risk (08/26/2022)   Humiliation, Afraid, Rape, and Kick questionnaire    Fear of Current or Ex-Partner: No  Emotionally Abused: No    Physically Abused: No    Sexually Abused: No     Review of Systems   Gen: Denies any fever, chills, loss of appetite, change in weight or weight loss CV: Denies chest pain, heart palpitations, syncope, edema  Resp: Denies shortness of breath with rest, cough, wheezing, coughing up blood, and pleurisy. GI: see HPI GU : Denies urinary burning, blood in urine, urinary frequency, and urinary incontinence. MS: Denies joint pain, limitation of movement, swelling, cramps, and atrophy.  Derm: Denies rash, itching, dry skin, hives. Psych: +depression, intermittent  confusion. Denies  anxiety, memory loss, hallucinations. Heme: Denies bruising or bleeding Neuro:  + dizziness. Denies any headaches, paresthesias, shaking  Physical Exam   Vital Signs in last 24 hours: Temp:  [99.8 F (37.7 C)] 99.8 F (37.7 C) (03/22 1224) Pulse Rate:  [129] 129 (03/22 1224) Resp:  [20] 20 (03/22 1224) BP: (131)/(91) 131/91 (03/22 1224) SpO2:  [97 %] 97 % (03/22 1224) Weight:  [83.9 kg] 83.9 kg (03/22 1219)    General:   Alert, ill-appearing, pleasant Head:  Normocephalic and atraumatic. Eyes:  Sclera clear, no icterus.   Conjunctiva pink. Ears:  Normal auditory acuity. Lungs:  Clear throughout to auscultation, diminished at the bases.   No wheezes, crackles, or rhonchi. No acute distress. Heart: Sinus tachycardia; no murmurs, clicks, rubs,  or gallops. Abdomen:  Soft, mild distention, mild TTP. No masses, hepatosplenomegaly or hernias noted. Normal bowel sounds, without guarding, and without rebound.   Rectal: Deferred Extremities: +2/+3 peripheral edema bilaterally extending up to his thighs. Neurologic:  Alert and oriented x4.  Mildly forgetful Skin:  Intact without significant lesions or rashes. Psych:  Alert and cooperative. Normal mood and affect.  Intake/Output from previous day: No intake/output data recorded. Intake/Output this shift: No intake/output data recorded.  Labs/Studies   Recent Labs Recent Labs    09/24/22 1235 09/24/22 1240  WBC 12.8*  --   HGB 9.6* 10.2*  HCT 27.9* 30.0*  PLT 118*  --    BMET Recent Labs    09/24/22 1235 09/24/22 1240  NA 132* 135  K 3.8 4.1  CL 94* 93*  CO2 24  --   GLUCOSE 264* 252*  BUN 11 11  CREATININE 0.87 1.00  CALCIUM 7.8*  --    LFT Recent Labs    09/24/22 1235  PROT 5.7*  ALBUMIN 2.3*  AST 74*  ALT 28  ALKPHOS 238*  BILITOT 3.9*   PT/INR Recent Labs    09/24/22 1235  LABPROT 24.4*  INR 2.2*   Hepatitis Panel No results for input(s): "HEPBSAG", "HCVAB", "HEPAIGM",  "HEPBIGM" in the last 72 hours. C-Diff No results for input(s): "CDIFFTOX" in the last 72 hours.  Radiology/Studies No results found.   Assessment   Shawn Velazquez is a 57 y.o. year old male with history of decompensated alcoholic cirrhosis with ongoing dependence, alcohlic hepatitis, sleep apnea, DM, HTN who presented to the ED with hematemesis.   Upper GI bleed: Hgb 9.6 on arrival, down from 11.2 one week ago. Suspected variceal bleed. BP stable but is mildly tachycardic.  Has had ongoing hematemesis and also with melena.  Will need to recheck CBC in the near future.  Will go ahead and prepare blood in preparation for EGD.  Plan to start him on octreotide infusion as well as PPI IV twice daily and Rocephin for SBP prophylaxis.  Planning for EGD today with Dr. Gala Romney.  I have discussed the risks,  alternatives, benefits with regards to but not limited to the risk of reaction to medication, bleeding, infection, perforation and the patient is agreeable to proceed with EGD today with Dr. Gala Romney. Written consent to be obtained.  Decompensated Cirrhosis: MELD 3.0 23. DF 56.3 with PT control of 13. Prior hospital admission in January with alcoholic hepatitis and was on prednisolone but discontinued given ongoing Etoh use with (2) 16 ounce beers daily.  Who presented with upper GI bleed in setting of esophageal varices s/p banding in February.  Currently on lactulose for mild hepatic encephalopathy, if he develops worsening confusion could consider adding Xifaxan but currently tolerating lactulose once daily at home with 2-3 soft bowel movements daily.  Presents with mild hyponatremia improved from prior hospitalization.  He does have extensive +2/+3 peripheral edema bilaterally likely secondary to missing doses of Lasix.  Will restart him on his home Lasix 40 mg daily and spironolactone 100 mg daily.  Would likely also benefit from 25 g of albumin 3 times daily.  Patient's sister who was present with  him today in the ED expresses her concern about determining if there is a way to involuntarily commit him to an inpatient treatment facility for alcohol cessation.  Plan / Recommendations   Recheck CBC 4 hours after original check EGD today with Dr. Gala Romney.  NPO IV PPI twice daily and Octreotide infusion Rocephin 2g daily for SBP prophylaxis Continue to monitor H/H, transfuse for Hgb <7 Complete alcohol cessation, stressed importance again and he reports he is open to conversation with social work regarding available resources. Recommend social work consult. Continue lactulose 10 g daily and titrate for 2-3 soft BM daily. Consider Xifaxan if any worsening hepatic encephalopathy. Restart home Lasix 40 mg once daily as well as spironolactone 100 mg daily.  Will reassess candidacy for prednisolone pending clinical course.  Not a candidate right now given mild leukocytosis and ongoing alcohol use.     09/24/2022, 1:01 PM  Venetia Night, MSN, FNP-BC, AGACNP-BC [facility]

## 2022-09-25 DIAGNOSIS — K766 Portal hypertension: Secondary | ICD-10-CM

## 2022-09-25 DIAGNOSIS — I10 Essential (primary) hypertension: Secondary | ICD-10-CM

## 2022-09-25 DIAGNOSIS — Z8249 Family history of ischemic heart disease and other diseases of the circulatory system: Secondary | ICD-10-CM | POA: Diagnosis not present

## 2022-09-25 DIAGNOSIS — K920 Hematemesis: Secondary | ICD-10-CM | POA: Diagnosis present

## 2022-09-25 DIAGNOSIS — K7031 Alcoholic cirrhosis of liver with ascites: Secondary | ICD-10-CM | POA: Diagnosis not present

## 2022-09-25 DIAGNOSIS — K219 Gastro-esophageal reflux disease without esophagitis: Secondary | ICD-10-CM | POA: Diagnosis present

## 2022-09-25 DIAGNOSIS — G4730 Sleep apnea, unspecified: Secondary | ICD-10-CM | POA: Diagnosis not present

## 2022-09-25 DIAGNOSIS — K700 Alcoholic fatty liver: Secondary | ICD-10-CM | POA: Diagnosis present

## 2022-09-25 DIAGNOSIS — M549 Dorsalgia, unspecified: Secondary | ICD-10-CM | POA: Diagnosis present

## 2022-09-25 DIAGNOSIS — F1010 Alcohol abuse, uncomplicated: Secondary | ICD-10-CM | POA: Diagnosis present

## 2022-09-25 DIAGNOSIS — E1165 Type 2 diabetes mellitus with hyperglycemia: Secondary | ICD-10-CM | POA: Diagnosis present

## 2022-09-25 DIAGNOSIS — Z794 Long term (current) use of insulin: Secondary | ICD-10-CM | POA: Diagnosis not present

## 2022-09-25 DIAGNOSIS — K922 Gastrointestinal hemorrhage, unspecified: Secondary | ICD-10-CM | POA: Diagnosis present

## 2022-09-25 DIAGNOSIS — F17210 Nicotine dependence, cigarettes, uncomplicated: Secondary | ICD-10-CM | POA: Diagnosis present

## 2022-09-25 DIAGNOSIS — K7682 Hepatic encephalopathy: Secondary | ICD-10-CM | POA: Diagnosis present

## 2022-09-25 DIAGNOSIS — I8511 Secondary esophageal varices with bleeding: Secondary | ICD-10-CM | POA: Diagnosis present

## 2022-09-25 DIAGNOSIS — F419 Anxiety disorder, unspecified: Secondary | ICD-10-CM | POA: Diagnosis present

## 2022-09-25 DIAGNOSIS — L409 Psoriasis, unspecified: Secondary | ICD-10-CM | POA: Diagnosis present

## 2022-09-25 DIAGNOSIS — I8510 Secondary esophageal varices without bleeding: Secondary | ICD-10-CM | POA: Diagnosis not present

## 2022-09-25 DIAGNOSIS — K7010 Alcoholic hepatitis without ascites: Secondary | ICD-10-CM | POA: Diagnosis present

## 2022-09-25 DIAGNOSIS — Z833 Family history of diabetes mellitus: Secondary | ICD-10-CM | POA: Diagnosis not present

## 2022-09-25 DIAGNOSIS — E871 Hypo-osmolality and hyponatremia: Secondary | ICD-10-CM | POA: Diagnosis present

## 2022-09-25 DIAGNOSIS — G8929 Other chronic pain: Secondary | ICD-10-CM | POA: Diagnosis present

## 2022-09-25 DIAGNOSIS — E876 Hypokalemia: Secondary | ICD-10-CM | POA: Diagnosis not present

## 2022-09-25 DIAGNOSIS — K7290 Hepatic failure, unspecified without coma: Secondary | ICD-10-CM | POA: Diagnosis not present

## 2022-09-25 DIAGNOSIS — Z79899 Other long term (current) drug therapy: Secondary | ICD-10-CM | POA: Diagnosis not present

## 2022-09-25 DIAGNOSIS — D696 Thrombocytopenia, unspecified: Secondary | ICD-10-CM | POA: Diagnosis present

## 2022-09-25 DIAGNOSIS — K7030 Alcoholic cirrhosis of liver without ascites: Secondary | ICD-10-CM | POA: Diagnosis present

## 2022-09-25 DIAGNOSIS — D62 Acute posthemorrhagic anemia: Secondary | ICD-10-CM | POA: Diagnosis present

## 2022-09-25 LAB — COMPREHENSIVE METABOLIC PANEL
ALT: 27 U/L (ref 0–44)
AST: 85 U/L — ABNORMAL HIGH (ref 15–41)
Albumin: 2.2 g/dL — ABNORMAL LOW (ref 3.5–5.0)
Alkaline Phosphatase: 203 U/L — ABNORMAL HIGH (ref 38–126)
Anion gap: 10 (ref 5–15)
BUN: 18 mg/dL (ref 6–20)
CO2: 26 mmol/L (ref 22–32)
Calcium: 7.4 mg/dL — ABNORMAL LOW (ref 8.9–10.3)
Chloride: 94 mmol/L — ABNORMAL LOW (ref 98–111)
Creatinine, Ser: 0.66 mg/dL (ref 0.61–1.24)
GFR, Estimated: >60 mL/min (ref >60)
Glucose, Bld: 312 mg/dL — ABNORMAL HIGH (ref 70–99)
Potassium: 5.1 mmol/L (ref 3.5–5.1)
Sodium: 130 mmol/L — ABNORMAL LOW (ref 135–145)
Total Bilirubin: 4.1 mg/dL — ABNORMAL HIGH (ref 0.3–1.2)
Total Protein: 5.3 g/dL — ABNORMAL LOW (ref 6.5–8.1)

## 2022-09-25 LAB — CBC
HCT: 23.4 % — ABNORMAL LOW (ref 39.0–52.0)
Hemoglobin: 8.2 g/dL — ABNORMAL LOW (ref 13.0–17.0)
MCH: 35.5 pg — ABNORMAL HIGH (ref 26.0–34.0)
MCHC: 35 g/dL (ref 30.0–36.0)
MCV: 101.3 fL — ABNORMAL HIGH (ref 80.0–100.0)
Platelets: 48 10*3/uL — ABNORMAL LOW (ref 150–400)
RBC: 2.31 MIL/uL — ABNORMAL LOW (ref 4.22–5.81)
RDW: 15 % (ref 11.5–15.5)
WBC: 8.3 10*3/uL (ref 4.0–10.5)
nRBC: 0 % (ref 0.0–0.2)

## 2022-09-25 LAB — GLUCOSE, CAPILLARY
Glucose-Capillary: 297 mg/dL — ABNORMAL HIGH (ref 70–99)
Glucose-Capillary: 251 mg/dL — ABNORMAL HIGH (ref 70–99)
Glucose-Capillary: 307 mg/dL — ABNORMAL HIGH (ref 70–99)
Glucose-Capillary: 241 mg/dL — ABNORMAL HIGH (ref 70–99)
Glucose-Capillary: 203 mg/dL — ABNORMAL HIGH (ref 70–99)

## 2022-09-25 LAB — PROTIME-INR
INR: 2.3 — ABNORMAL HIGH (ref 0.8–1.2)
Prothrombin Time: 24.9 seconds — ABNORMAL HIGH (ref 11.4–15.2)

## 2022-09-25 MED ORDER — CHLORHEXIDINE GLUCONATE CLOTH 2 % EX PADS: 6.0000 | MEDICATED_PAD | Freq: Every day | CUTANEOUS | Status: DC

## 2022-09-25 MED ORDER — ORAL CARE MOUTH RINSE: 15.0000 mL | OROMUCOSAL | Status: DC | PRN

## 2022-09-25 MED ORDER — VITAMIN K1 10 MG/ML IJ SOLN
10.0000 mg | Freq: Once | INTRAVENOUS | Status: AC
  Administered 2022-09-25: 10 mg via INTRAVENOUS
  Filled 2022-09-25: qty 1

## 2022-09-25 MED ORDER — CHLORHEXIDINE GLUCONATE CLOTH 2 % EX PADS
6.0000 | MEDICATED_PAD | Freq: Every day | CUTANEOUS | Status: DC
  Administered 2022-09-25 – 2022-09-27 (×3): 6 via TOPICAL

## 2022-09-25 NOTE — Progress Notes (Signed)
Security made aware of the request not to allow Butch Penny to visit

## 2022-09-25 NOTE — Progress Notes (Signed)
PROGRESS NOTE     Shawn Velazquez, is a 57 y.o. male, DOB - 1966-05-02, [id number]  Admit date - 09/24/2022   Admitting Physician Marchetta Navratil Denton Brick, MD  Outpatient Primary MD for the patient is Celene Squibb, MD  LOS - 0  Chief Complaint  Patient presents with   Emesis        Brief Narrative:  57 y.o. male significant for ongoing alcohol abuse, alcoholic liver cirrhosis, diabetes mellitus, hypertension, anxiety, tobacco abuse and recurrent GI bleed in the setting of esophageal varices admitted on 09/24/2022 with  hematemesis-found to have esophageal varices with bleeding and banded during EGD on 09/24/2022  Assessment and Plan: 1)Acute GI bleed/hematemesis-- EGD on 09/24/2022 shows Esophageal varices with bleeding stigmata, "status  Post" esophageal band ligation -Previous  EGD on 08/26/2022 with Varices that were banded -IV Protonix and IV octreotide for another 48 hours -GI consult appreciated -Antibiotics for total 5 days for SBP prophylaxis in the patient with cirrhosis and GI bleed -Currently on IV Rocephin day 2   2)Acute on chronic anemia due to acute blood loss/GI bleed -Please see #1 above -Initial hemoglobin was 10.2 repeat hemoglobin 8.9, hemoglobin was 11.2 on 09/16/2022 -INR is 2.2>> 2.3 , it was 1.7 on 08/29/2022 -Repeat vitamin K on 09/25/2022 -Monitor H&H and transfuse as clinically indicated   3)Alcoholic liver Cirrhosis----AST 74 with ALT of 28 consistent with alcoholic liver disease alk phos 238 T. bili 3.9 -Lasix and Aldactone as ordered -Transaminitis consistent with EtOH abuse pattern -GI recommending holding Coreg and lactulose until discharge -Monitor closely -Please see #2 above   4) ongoing alcohol abuse--- continues to ETOH drink daily -High risk for DTs -Benzos thiamine and folic acid and multivitamin as ordered per CIWA protocol   5) hyponatremia-- multifactorial in the setting of alcohol abuse and liver cirrhosis -Avoid excessive free water    6)DM2- Use Novolog/Humalog Sliding scale insulin with Accu-Cheks/Fingersticks as ordered  -Lantus insulin as ordered   7) social/ethics--patient is a full code  8) worsening anemia and thrombocytopenia--- patient with chronic anemia currently pending and due to underlying liver cirrhosis -Platelets are low due to combination of toxic effect of alcohol on bone marrow and thrombocytopenia of consumption due to GI bleed -Hgb drifting down due to GI blood loss/ABLA -Monitor closely and transfuse as indicated  Status is: Inpatient   Disposition: The patient is from: Home              Anticipated d/c is to: Home              Anticipated d/c date is: 2 days              Patient currently is not medically stable to d/c. Barriers: Not Clinically Stable-   Code Status :  -  Code Status: Full Code   Family Communication:    NA (patient is alert, awake and coherent)   DVT Prophylaxis  :   - SCDs   SCDs Start: 09/24/22 1715 Place TED hose Start: 09/24/22 1715   Lab Results  Component Value Date   PLT 48 (L) 09/25/2022    Inpatient Medications  Scheduled Meds:  sodium chloride   Intravenous Once   carvedilol  3.125 mg Oral BID WC   Chlorhexidine Gluconate Cloth  6 each Topical Q2200   FLUoxetine  40 mg Oral Daily   folic acid  1 mg Oral Daily   furosemide  40 mg Oral Daily   insulin aspart  0-5 Units Subcutaneous  QHS   insulin aspart  0-6 Units Subcutaneous TID WC   insulin glargine-yfgn  35 Units Subcutaneous QHS   lactulose  10 g Oral BID   multivitamin with minerals  1 tablet Oral Daily   pantoprazole (PROTONIX) IV  40 mg Intravenous Q12H   sodium chloride flush  3 mL Intravenous Q12H   sodium chloride flush  3 mL Intravenous Q12H   spironolactone  100 mg Oral Daily   sucralfate  1 g Oral TID WC & HS   thiamine  100 mg Oral Daily   Or   thiamine  100 mg Intravenous Daily   Continuous Infusions:  sodium chloride     cefTRIAXone (ROCEPHIN)  IV 2 g (09/25/22 1141)    octreotide (SANDOSTATIN) 500 mcg in sodium chloride 0.9 % 250 mL (2 mcg/mL) infusion 50 mcg/hr (09/24/22 2335)   PRN Meds:.sodium chloride, acetaminophen **OR** acetaminophen, albuterol, bisacodyl, LORazepam **OR** LORazepam, ondansetron **OR** ondansetron (ZOFRAN) IV, mouth rinse, polyethylene glycol, sodium chloride flush   Anti-infectives (From admission, onward)    Start     Dose/Rate Route Frequency Ordered Stop   09/25/22 1000  cefTRIAXone (ROCEPHIN) 2 g in sodium chloride 0.9 % 100 mL IVPB        2 g 200 mL/hr over 30 Minutes Intravenous Every 24 hours 09/24/22 1306     09/24/22 1300  cefTRIAXone (ROCEPHIN) 2 g in sodium chloride 0.9 % 100 mL IVPB        2 g 200 mL/hr over 30 Minutes Intravenous  Once 09/24/22 1256 09/24/22 1343         Subjective: Shawn Velazquez today has no fevers, no emesis,  No chest pain,  -Denies bloody or dark stools -Appetite is poor   Objective: Vitals:   09/25/22 1400 09/25/22 1415 09/25/22 1430 09/25/22 1600  BP:    103/71  Pulse: 93 95 93 93  Resp:  (!) 21 17 (!) 24  Temp:      TempSrc:      SpO2: 98% 98% 99% 97%  Weight:      Height:        Intake/Output Summary (Last 24 hours) at 09/25/2022 1642 Last data filed at 09/25/2022 0530 Gross per 24 hour  Intake --  Output 500 ml  Net -500 ml   Filed Weights   09/24/22 1435 09/24/22 1827 09/25/22 0510  Weight: 83.9 kg 92.7 kg 93 kg    Physical Exam  Gen:- Awake Alert, in no acute distress HEENT:- [place].AT, No sclera icterus Neck-Supple Neck,No JVD,.  Lungs-  CTAB , fair symmetrical air movement CV- S1, S2 normal, regular  Abd-  +ve B.Sounds, Abd Soft, No tenderness,    Extremity/Skin:- No  edema, pedal pulses present  Psych-affect is appropriate, oriented x3 Neuro-no new focal deficits, +ve tremors  Data Reviewed: I have personally reviewed following labs and imaging studies  CBC: Recent Labs  Lab 09/24/22 1235 09/24/22 1240 09/24/22 1633 09/25/22 0657  WBC 12.8*  --   14.3* 8.3  HGB 9.6* 10.2* 8.9* 8.2*  HCT 27.9* 30.0* 26.5* 23.4*  MCV 100.4*  --  100.8* 101.3*  PLT 118*  --  70* 48*   Basic Metabolic Panel: Recent Labs  Lab 09/24/22 1235 09/24/22 1240 09/25/22 0410  NA 132* 135 130*  K 3.8 4.1 5.1  CL 94* 93* 94*  CO2 24  --  26  GLUCOSE 264* 252* 312*  BUN 11 11 18   CREATININE 0.87 1.00 0.66  CALCIUM 7.8*  --  7.4*   GFR: Estimated Creatinine Clearance: 107.5 mL/min (by C-G formula based on SCr of 0.66 mg/dL). Liver Function Tests: Recent Labs  Lab 09/24/22 1235 09/25/22 0410  AST 74* 85*  ALT 28 27  ALKPHOS 238* 203*  BILITOT 3.9* 4.1*  PROT 5.7* 5.3*  ALBUMIN 2.3* 2.2*   Recent Results (from the past 240 hour(s))  MRSA Next Gen by PCR, Nasal     Status: None   Collection Time: 09/24/22  6:34 PM   Specimen: Nasal Mucosa; Nasal Swab  Result Value Ref Range Status   MRSA by PCR Next Gen NOT DETECTED NOT DETECTED Final    Comment: (NOTE) The GeneXpert MRSA Assay (FDA approved for NASAL specimens only), is one component of a comprehensive MRSA colonization surveillance program. It is not intended to diagnose MRSA infection nor to guide or monitor treatment for MRSA infections. Test performance is not FDA approved in patients less than 26 years old. Performed at [facility], [address], [place], [place] [address]      Scheduled Meds:  sodium chloride   Intravenous Once   carvedilol  3.125 mg Oral BID WC   Chlorhexidine Gluconate Cloth  6 each Topical Q2200   FLUoxetine  40 mg Oral Daily   folic acid  1 mg Oral Daily   furosemide  40 mg Oral Daily   insulin aspart  0-5 Units Subcutaneous QHS   insulin aspart  0-6 Units Subcutaneous TID WC   insulin glargine-yfgn  35 Units Subcutaneous QHS   lactulose  10 g Oral BID   multivitamin with minerals  1 tablet Oral Daily   pantoprazole (PROTONIX) IV  40 mg Intravenous Q12H   sodium chloride flush  3 mL Intravenous Q12H   sodium chloride flush  3 mL Intravenous Q12H    spironolactone  100 mg Oral Daily   sucralfate  1 g Oral TID WC & HS   thiamine  100 mg Oral Daily   Or   thiamine  100 mg Intravenous Daily   Continuous Infusions:  sodium chloride     cefTRIAXone (ROCEPHIN)  IV 2 g (09/25/22 1141)   octreotide (SANDOSTATIN) 500 mcg in sodium chloride 0.9 % 250 mL (2 mcg/mL) infusion 50 mcg/hr (09/24/22 2335)     LOS: 0 days    Roxan Hockey M.D on 09/25/2022 at 4:42 PM  Go to [email or web address] - for contact info  [facility] - Office  [phone]  If 7PM-7AM, please contact night-coverage [email or web address] 09/25/2022, 4:42 PM

## 2022-09-25 NOTE — Progress Notes (Signed)
Patient without complaints.  Clinically, no further bleeding overnight status post esophageal band ligation note for columns yesterday   Vital signs in last 24 hours: Temp:  [98.3 F (36.8 C)-100 F (37.8 C)] 98.5 F (36.9 C) (03/23 0812) Pulse Rate:  [76-140] 102 (03/23 0812) Resp:  [16-29] 17 (03/23 0812) BP: (100-157)/(69-95) 121/72 (03/23 0800) SpO2:  [94 %-100 %] 100 % (03/23 0812) Weight:  [83.9 kg-93 kg] 93 kg (03/23 0510) Last BM Date : 09/24/22 General: Somewhat somnolent, pleasant and cooperative in NAD Abdomen: Abdomen soft and nontender  extremities:  Without clubbing or edema.    Intake/Output from previous day: 03/22 0701 - 03/23 0700 In: 800 [I.V.:800] Out: 500 [Urine:500] Intake/Output this shift: No intake/output data recorded.  Lab Results: Recent Labs    09/24/22 1235 09/24/22 1240 09/24/22 1633 09/25/22 0657  WBC 12.8*  --  14.3* 8.3  HGB 9.6* 10.2* 8.9* 8.2*  HCT 27.9* 30.0* 26.5* 23.4*  PLT 118*  --  70* 48*   BMET Recent Labs    09/24/22 1235 09/24/22 1240 09/25/22 0410  NA 132* 135 130*  K 3.8 4.1 5.1  CL 94* 93* 94*  CO2 24  --  26  GLUCOSE 264* 252* 312*  BUN 11 11 18   CREATININE 0.87 1.00 0.66  CALCIUM 7.8*  --  7.4*   LFT Recent Labs    09/25/22 0410  PROT 5.3*  ALBUMIN 2.2*  AST 85*  ALT 27  ALKPHOS 203*  BILITOT 4.1*   PT/INR Recent Labs    09/24/22 1235 09/25/22 0410  LABPROT 24.4* 24.9*  INR 2.2* 2.3*   Impression: 57 year old gentleman with alcohol use induced cirrhosis with portal hypertension presents decompensated with an upper GI bleed. Emergency EGD yesterday demonstrated esophageal varices with bleeding stigmata.  Status post esophageal band ligation.  Clinically, bleeding appears to have ceased.  Aminotransferases consistent with EtOH laded hepatitis.  Appears relatively mild at this point.  PHQ-9 likely related to nutritional deficiencies.  Recommendations:  Continue ICU support  Vitamin K 10 mg  IV x 1 dose  Empiric antibiotics x 4 more days  Octreotide x 2 more days  Resume Corgard and lactulose at discharge  Repeat EGD for surveillance in 4 weeks

## 2022-09-25 NOTE — Progress Notes (Signed)
Patient awake and alert to person and place. Patient had an uneventful day. Still have black loose stools. Octreotide on ongoing. Patient do NOT want Shawn Velazquez visiting him. She is his ex-wife. They share a 59 year old autistic son together. The son can visit can visit but only escorted by the sister Shawn Velazquez or the girlfriend Shawn Velazquez per the patient's wishes. Patient noted to get out of bed without assistance twice and writer was alert by the bed alarm sounding. Patient educated to call for assistance when needed to get OOB and agree, however he is confused so more frequent rounding done to ensure patient safety. Safety maintained, call bell within reach, bed in lowest position, bed alarm on and functioning without any issues. Will continue to monitor and endorse.

## 2022-09-26 DIAGNOSIS — K920 Hematemesis: Secondary | ICD-10-CM | POA: Diagnosis not present

## 2022-09-26 DIAGNOSIS — D696 Thrombocytopenia, unspecified: Secondary | ICD-10-CM | POA: Diagnosis present

## 2022-09-26 LAB — COMPREHENSIVE METABOLIC PANEL
ALT: 25 U/L (ref 0–44)
AST: 68 U/L — ABNORMAL HIGH (ref 15–41)
Albumin: 2.1 g/dL — ABNORMAL LOW (ref 3.5–5.0)
Alkaline Phosphatase: 181 U/L — ABNORMAL HIGH (ref 38–126)
Anion gap: 9 (ref 5–15)
BUN: 20 mg/dL (ref 6–20)
CO2: 27 mmol/L (ref 22–32)
Calcium: 7.5 mg/dL — ABNORMAL LOW (ref 8.9–10.3)
Chloride: 94 mmol/L — ABNORMAL LOW (ref 98–111)
Creatinine, Ser: 0.83 mg/dL (ref 0.61–1.24)
GFR, Estimated: >60 mL/min (ref >60)
Glucose, Bld: 187 mg/dL — ABNORMAL HIGH (ref 70–99)
Potassium: 3.2 mmol/L — ABNORMAL LOW (ref 3.5–5.1)
Sodium: 130 mmol/L — ABNORMAL LOW (ref 135–145)
Total Bilirubin: 3.2 mg/dL — ABNORMAL HIGH (ref 0.3–1.2)
Total Protein: 5.2 g/dL — ABNORMAL LOW (ref 6.5–8.1)

## 2022-09-26 LAB — GLUCOSE, CAPILLARY
Glucose-Capillary: 201 mg/dL — ABNORMAL HIGH (ref 70–99)
Glucose-Capillary: 171 mg/dL — ABNORMAL HIGH (ref 70–99)
Glucose-Capillary: 279 mg/dL — ABNORMAL HIGH (ref 70–99)
Glucose-Capillary: 271 mg/dL — ABNORMAL HIGH (ref 70–99)

## 2022-09-26 LAB — CBC
HCT: 24.1 % — ABNORMAL LOW (ref 39.0–52.0)
Hemoglobin: 8.5 g/dL — ABNORMAL LOW (ref 13.0–17.0)
MCH: 36.6 pg — ABNORMAL HIGH (ref 26.0–34.0)
MCHC: 35.3 g/dL (ref 30.0–36.0)
MCV: 103.9 fL — ABNORMAL HIGH (ref 80.0–100.0)
Platelets: 24 10*3/uL — CL (ref 150–400)
RBC: 2.32 MIL/uL — ABNORMAL LOW (ref 4.22–5.81)
RDW: 14.9 % (ref 11.5–15.5)
WBC: 8.1 10*3/uL (ref 4.0–10.5)
nRBC: 0 % (ref 0.0–0.2)

## 2022-09-26 LAB — PROTIME-INR
INR: 1.9 — ABNORMAL HIGH (ref 0.8–1.2)
Prothrombin Time: 21.9 seconds — ABNORMAL HIGH (ref 11.4–15.2)

## 2022-09-26 NOTE — Progress Notes (Signed)
PROGRESS NOTE     Shawn Velazquez, is a 57 y.o. male, DOB - 07/23/1965, [id number]  Admit date - 09/24/2022   Admitting Physician Dia Jefferys Denton Brick, MD  Outpatient Primary MD for the patient is Celene Squibb, MD  LOS - 1  Chief Complaint  Patient presents with   Emesis        Brief Narrative:  57 y.o. male significant for ongoing alcohol abuse, alcoholic liver cirrhosis, diabetes mellitus, hypertension, anxiety, tobacco abuse and recurrent GI bleed in the setting of esophageal varices admitted on 09/24/2022 with  hematemesis-found to have esophageal varices with bleeding and banded during EGD on 09/24/2022 - -Octreotide and Iv protonix thru Monday 09/27/22 -Possible discharge late 09/27/2022 or 09/28/2022 if hemodynamically stable  Assessment and Plan: 1)Acute GI bleed/hematemesis-- EGD on 09/24/2022 shows Esophageal varices with bleeding stigmata, "status  Post" esophageal band ligation -Previous  EGD on 08/26/2022 with Varices that were banded 09/26/22 -Patient having black stools -IV Protonix and IV octreotide for another 24 hours until 09/27/2022 -GI consult appreciated -Antibiotics for total 5 days for SBP prophylaxis in the patient with cirrhosis and GI bleed -Currently on IV Rocephin day 3 -EtOH abuse with variceal bleeding -Status post EGD with banding -Octreotide and Iv protonix thru Monday 09/27/22 -Possible discharge late 09/27/2022 or 09/28/2022 if hemodynamically stable   2)Acute on chronic anemia due to acute blood loss/GI bleed -Please see #1 above -Initial hemoglobin was 10.2 repeat hemoglobin 8.9, hemoglobin was 11.2 on 09/16/2022 -INR peaked at 2.3, it was 1.7 on 08/29/2022 -INR started to trend down after couple doses of vitamin K -Monitor H&H and transfuse as clinically indicated   3)Alcoholic liver Cirrhosis---- =--Lasix and Aldactone as ordered -Transaminitis consistent with EtOH abuse pattern -GI recommending holding Coreg and lactulose until  discharge -Monitor closely -Please see #2 above   4) ongoing alcohol abuse--- continues to ETOH drink daily -High risk for DTs -Benzos thiamine and folic acid and multivitamin as ordered per CIWA protocol -Despite persuasion from patient's [name] at bedside patient is not interested in alcohol rehab services   5) hyponatremia/hypokalemia-- multifactorial in the setting of alcohol abuse and liver cirrhosis -Avoid excessive free water -Replace potassium -Check magnesium   6)DM2- Use Novolog/Humalog Sliding scale insulin with Accu-Cheks/Fingersticks as ordered  -Lantus insulin as ordered   7) social/ethics--patient is a full code  8) acute on chronic anemia and acute on chronic thrombocytopenia--- patient with chronic anemia currently pending and due to underlying liver cirrhosis 09/26/22 -Platelets are very low (24K) due to combination of toxic effect of alcohol on bone marrow and thrombocytopenia of consumption due to GI bleed -Hgb is low due to GI blood loss/ABLA -Monitor closely and transfuse as indicated -Consider transfusing platelets if less than 20 K  Status is: Inpatient   Disposition: The patient is from: Home              Anticipated d/c is to: Home              Anticipated d/c date is: 2 days              Patient currently is not medically stable to d/c. Barriers: Not Clinically Stable-   Code Status :  -  Code Status: Full Code   Family Communication:    Discussed with patient's Sister Shawn Velazquez Pulse at bedside  DVT Prophylaxis  :   - SCDs   SCDs Start: 09/24/22 1715 Place TED hose Start: 09/24/22 1715   Lab Results  Component Value  Date   PLT 24 (LL) 09/26/2022    Inpatient Medications  Scheduled Meds:  sodium chloride   Intravenous Once   Chlorhexidine Gluconate Cloth  6 each Topical Q2200   FLUoxetine  40 mg Oral Daily   folic acid  1 mg Oral Daily   furosemide  40 mg Oral Daily   insulin aspart  0-5 Units Subcutaneous QHS   insulin aspart  0-6 Units  Subcutaneous TID WC   insulin glargine-yfgn  35 Units Subcutaneous QHS   multivitamin with minerals  1 tablet Oral Daily   pantoprazole (PROTONIX) IV  40 mg Intravenous Q12H   sodium chloride flush  3 mL Intravenous Q12H   sodium chloride flush  3 mL Intravenous Q12H   spironolactone  100 mg Oral Daily   sucralfate  1 g Oral TID WC & HS   thiamine  100 mg Oral Daily   Or   thiamine  100 mg Intravenous Daily   Continuous Infusions:  sodium chloride     cefTRIAXone (ROCEPHIN)  IV Stopped (09/25/22 1219)   octreotide (SANDOSTATIN) 500 mcg in sodium chloride 0.9 % 250 mL (2 mcg/mL) infusion 50 mcg/hr (09/26/22 0726)   PRN Meds:.sodium chloride, acetaminophen **OR** acetaminophen, albuterol, bisacodyl, LORazepam **OR** LORazepam, ondansetron **OR** ondansetron (ZOFRAN) IV, mouth rinse, polyethylene glycol, sodium chloride flush   Anti-infectives (From admission, onward)    Start     Dose/Rate Route Frequency Ordered Stop   09/25/22 1000  cefTRIAXone (ROCEPHIN) 2 g in sodium chloride 0.9 % 100 mL IVPB        2 g 200 mL/hr over 30 Minutes Intravenous Every 24 hours 09/24/22 1306     09/24/22 1300  cefTRIAXone (ROCEPHIN) 2 g in sodium chloride 0.9 % 100 mL IVPB        2 g 200 mL/hr over 30 Minutes Intravenous  Once 09/24/22 1256 09/24/22 1343         Subjective: [place] today has no fevers, no emesis,  No chest pain,  - Having black stools -No abdominal pain -Patient's Sister Shawn Velazquez Pulse at bedside, questions answered  Objective: Vitals:   09/26/22 0500 09/26/22 0625 09/26/22 0700 09/26/22 0712  BP: 106/66 104/69 130/73   Pulse: 86 91 84   Resp: 16 16 18    Temp:    98.1 F (36.7 C)  TempSrc:      SpO2: 94% 97% 98%   Weight:      Height:        Intake/Output Summary (Last 24 hours) at 09/26/2022 0912 Last data filed at 09/26/2022 0706 Gross per 24 hour  Intake 1609.32 ml  Output 1000 ml  Net 609.32 ml   Filed Weights   09/24/22 1435 09/24/22 1827 09/25/22 0510   Weight: 83.9 kg 92.7 kg 93 kg    Physical Exam  Gen:- Awake Alert, in no acute distress HEENT:- [place].AT, No sclera icterus Neck-Supple Neck,No JVD,.  Lungs-  CTAB , fair symmetrical air movement CV- S1, S2 normal, regular  Abd-  +ve B.Sounds, Abd Soft, No tenderness,    Extremity/Skin:- No  edema, pedal pulses present  Psych-affect is appropriate, oriented x3 Neuro-no new focal deficits, +ve tremors  Data Reviewed: I have personally reviewed following labs and imaging studies  CBC: Recent Labs  Lab 09/24/22 1235 09/24/22 1240 09/24/22 1633 09/25/22 0657 09/26/22 0358  WBC 12.8*  --  14.3* 8.3 8.1  HGB 9.6* 10.2* 8.9* 8.2* 8.5*  HCT 27.9* 30.0* 26.5* 23.4* 24.1*  MCV 100.4*  --  100.8* 101.3* 103.9*  PLT 118*  --  70* 48* 24*   Basic Metabolic Panel: Recent Labs  Lab 09/24/22 1235 09/24/22 1240 09/25/22 0410 09/26/22 0358  NA 132* 135 130* 130*  K 3.8 4.1 5.1 3.2*  CL 94* 93* 94* 94*  CO2 24  --  26 27  GLUCOSE 264* 252* 312* 187*  BUN 11 11 18 20   CREATININE 0.87 1.00 0.66 0.83  CALCIUM 7.8*  --  7.4* 7.5*   GFR: Estimated Creatinine Clearance: 103.6 mL/min (by C-G formula based on SCr of 0.83 mg/dL). Liver Function Tests: Recent Labs  Lab 09/24/22 1235 09/25/22 0410 09/26/22 0358  AST 74* 85* 68*  ALT 28 27 25   ALKPHOS 238* 203* 181*  BILITOT 3.9* 4.1* 3.2*  PROT 5.7* 5.3* 5.2*  ALBUMIN 2.3* 2.2* 2.1*   Recent Results (from the past 240 hour(s))  MRSA Next Gen by PCR, Nasal     Status: None   Collection Time: 09/24/22  6:34 PM   Specimen: Nasal Mucosa; Nasal Swab  Result Value Ref Range Status   MRSA by PCR Next Gen NOT DETECTED NOT DETECTED Final    Comment: (NOTE) The GeneXpert MRSA Assay (FDA approved for NASAL specimens only), is one component of a comprehensive MRSA colonization surveillance program. It is not intended to diagnose MRSA infection nor to guide or monitor treatment for MRSA infections. Test performance is not FDA approved in  patients less than 16 years old. Performed at [facility], [address] [place], [place] [address]      Scheduled Meds:  sodium chloride   Intravenous Once   Chlorhexidine Gluconate Cloth  6 each Topical Q2200   FLUoxetine  40 mg Oral Daily   folic acid  1 mg Oral Daily   furosemide  40 mg Oral Daily   insulin aspart  0-5 Units Subcutaneous QHS   insulin aspart  0-6 Units Subcutaneous TID WC   insulin glargine-yfgn  35 Units Subcutaneous QHS   multivitamin with minerals  1 tablet Oral Daily   pantoprazole (PROTONIX) IV  40 mg Intravenous Q12H   sodium chloride flush  3 mL Intravenous Q12H   sodium chloride flush  3 mL Intravenous Q12H   spironolactone  100 mg Oral Daily   sucralfate  1 g Oral TID WC & HS   thiamine  100 mg Oral Daily   Or   thiamine  100 mg Intravenous Daily   Continuous Infusions:  sodium chloride     cefTRIAXone (ROCEPHIN)  IV Stopped (09/25/22 1219)   octreotide (SANDOSTATIN) 500 mcg in sodium chloride 0.9 % 250 mL (2 mcg/mL) infusion 50 mcg/hr (09/26/22 0726)     LOS: 1 day    Roxan Hockey M.D on 09/26/2022 at 9:12 AM  Go to [email or web address] - for contact info  [facility] - Office  [phone]  If 7PM-7AM, please contact night-coverage [email or web address] 09/26/2022, 9:12 AM

## 2022-09-26 NOTE — TOC Progression Note (Signed)
Transition of Care [facility]) - Progression Note    Patient Details  Name: KAILLOU CUPPETT MRN: [id number] Date of Birth: [birth year]  Transition of Care [facility]) CM/SW [place], LCSW Phone Number: 09/26/2022, 3:23 PM  Clinical Narrative:    LCSWA added SA info to ABS.  Did not meet with patient. To assess.         Expected Discharge Plan and Services                                               Social Determinants of Health (SDOH) Interventions SDOH Screenings   Food Insecurity: No Food Insecurity (08/31/2022)  Housing: Low Risk  (08/26/2022)  Transportation Needs: No Transportation Needs (08/31/2022)  Utilities: Not At Risk (08/26/2022)  Depression (PHQ2-9): Medium Risk (08/18/2022)  Tobacco Use: High Risk (09/24/2022)    Readmission Risk Interventions    08/26/2022   11:17 AM  Readmission Risk Prevention Plan  Transportation Screening Complete  Home Care Screening Complete  Medication Review (RN CM) Complete

## 2022-09-27 DIAGNOSIS — K920 Hematemesis: Secondary | ICD-10-CM | POA: Diagnosis not present

## 2022-09-27 DIAGNOSIS — K7460 Unspecified cirrhosis of liver: Secondary | ICD-10-CM

## 2022-09-27 DIAGNOSIS — F1010 Alcohol abuse, uncomplicated: Secondary | ICD-10-CM | POA: Diagnosis not present

## 2022-09-27 DIAGNOSIS — K7290 Hepatic failure, unspecified without coma: Secondary | ICD-10-CM | POA: Diagnosis not present

## 2022-09-27 DIAGNOSIS — D696 Thrombocytopenia, unspecified: Secondary | ICD-10-CM

## 2022-09-27 DIAGNOSIS — D62 Acute posthemorrhagic anemia: Secondary | ICD-10-CM

## 2022-09-27 DIAGNOSIS — R601 Generalized edema: Secondary | ICD-10-CM

## 2022-09-27 LAB — URINALYSIS, ROUTINE W REFLEX MICROSCOPIC
Bilirubin Urine: NEGATIVE
Glucose, UA: NEGATIVE mg/dL
Ketones, ur: NEGATIVE mg/dL
Leukocytes,Ua: NEGATIVE
Nitrite: NEGATIVE
Protein, ur: NEGATIVE mg/dL
Specific Gravity, Urine: 1.014 (ref 1.005–1.030)
pH: 7 (ref 5.0–8.0)

## 2022-09-27 LAB — RENAL FUNCTION PANEL
Albumin: 2 g/dL — ABNORMAL LOW (ref 3.5–5.0)
Anion gap: 8 (ref 5–15)
BUN: 14 mg/dL (ref 6–20)
CO2: 28 mmol/L (ref 22–32)
Calcium: 7.2 mg/dL — ABNORMAL LOW (ref 8.9–10.3)
Chloride: 94 mmol/L — ABNORMAL LOW (ref 98–111)
Creatinine, Ser: 0.95 mg/dL (ref 0.61–1.24)
GFR, Estimated: >60 mL/min (ref >60)
Glucose, Bld: 221 mg/dL — ABNORMAL HIGH (ref 70–99)
Phosphorus: 1.9 mg/dL — ABNORMAL LOW (ref 2.5–4.6)
Potassium: 3 mmol/L — ABNORMAL LOW (ref 3.5–5.1)
Sodium: 130 mmol/L — ABNORMAL LOW (ref 135–145)

## 2022-09-27 LAB — CBC
HCT: 22.3 % — ABNORMAL LOW (ref 39.0–52.0)
Hemoglobin: 7.8 g/dL — ABNORMAL LOW (ref 13.0–17.0)
MCH: 35.6 pg — ABNORMAL HIGH (ref 26.0–34.0)
MCHC: 35 g/dL (ref 30.0–36.0)
MCV: 101.8 fL — ABNORMAL HIGH (ref 80.0–100.0)
Platelets: 19 10*3/uL — CL (ref 150–400)
RBC: 2.19 MIL/uL — ABNORMAL LOW (ref 4.22–5.81)
RDW: 15 % (ref 11.5–15.5)
WBC: 7.5 10*3/uL (ref 4.0–10.5)
nRBC: 0 % (ref 0.0–0.2)

## 2022-09-27 LAB — PROTIME-INR
INR: 1.7 — ABNORMAL HIGH (ref 0.8–1.2)
Prothrombin Time: 19.7 seconds — ABNORMAL HIGH (ref 11.4–15.2)

## 2022-09-27 LAB — HEMOGLOBIN AND HEMATOCRIT, BLOOD
HCT: 23.5 % — ABNORMAL LOW (ref 39.0–52.0)
Hemoglobin: 8 g/dL — ABNORMAL LOW (ref 13.0–17.0)

## 2022-09-27 LAB — GLUCOSE, CAPILLARY
Glucose-Capillary: 207 mg/dL — ABNORMAL HIGH (ref 70–99)
Glucose-Capillary: 211 mg/dL — ABNORMAL HIGH (ref 70–99)
Glucose-Capillary: 76 mg/dL (ref 70–99)
Glucose-Capillary: 147 mg/dL — ABNORMAL HIGH (ref 70–99)

## 2022-09-27 LAB — PLATELET COUNT: Platelets: 27 10*3/uL — CL (ref 150–400)

## 2022-09-27 LAB — MAGNESIUM: Magnesium: 1.4 mg/dL — ABNORMAL LOW (ref 1.7–2.4)

## 2022-09-27 MED ORDER — POTASSIUM CHLORIDE CRYS ER 20 MEQ PO TBCR
40.0000 meq | EXTENDED_RELEASE_TABLET | Freq: Once | ORAL | Status: AC
  Administered 2022-09-27: 40 meq via ORAL
  Filled 2022-09-27: qty 2

## 2022-09-27 MED ORDER — LACTULOSE 10 GM/15ML PO SOLN
10.0000 g | Freq: Two times a day (BID) | ORAL | Status: DC
  Administered 2022-09-27 – 2022-09-28 (×3): 10 g via ORAL
  Filled 2022-09-27 (×3): qty 30

## 2022-09-27 MED ORDER — CALCIUM GLUCONATE-NACL 1-0.675 GM/50ML-% IV SOLN
1.0000 g | Freq: Once | INTRAVENOUS | Status: AC
  Administered 2022-09-27: 1000 mg via INTRAVENOUS
  Filled 2022-09-27: qty 50

## 2022-09-27 MED ORDER — PANTOPRAZOLE SODIUM 40 MG PO TBEC
40.0000 mg | DELAYED_RELEASE_TABLET | Freq: Two times a day (BID) | ORAL | Status: DC
  Administered 2022-09-27 – 2022-09-28 (×2): 40 mg via ORAL
  Filled 2022-09-27 (×2): qty 1

## 2022-09-27 MED ORDER — MAGNESIUM SULFATE 2 GM/50ML IV SOLN
2.0000 g | Freq: Once | INTRAVENOUS | Status: AC
  Administered 2022-09-27: 2 g via INTRAVENOUS
  Filled 2022-09-27: qty 50

## 2022-09-27 NOTE — Progress Notes (Signed)
CRITICAL VALUE STICKER  CRITICAL VALUE: Platelets : 19K  RECEIVER (on-site recipient of call): Kathleen Lime, RN  DATE & TIME NOTIFIED: 5:12 AM  MESSENGER (representative from lab):  MD NOTIFIED: Dr. Josephine Cables,   TIME OF NOTIFICATION:5:14 AM  RESPONSE:  awaiting

## 2022-09-27 NOTE — Inpatient Diabetes Management (Signed)
Inpatient Diabetes Program Recommendations  AACE/ADA: New Consensus Statement on Inpatient Glycemic Control  Target Ranges:  Prepandial:   less than 140 mg/dL      Peak postprandial:   less than 180 mg/dL (1-2 hours)      Critically ill patients:  140 - 180 mg/dL    Latest Reference Range & Units 09/26/22 08:07 09/26/22 11:46 09/26/22 16:05 09/26/22 21:26 09/27/22 07:30  Glucose-Capillary 70 - 99 mg/dL 201 (H) 171 (H) 279 (H) 271 (H) 207 (H)   Review of Glycemic Control  Diabetes history: DM2 Outpatient Diabetes medications: Lantus 35 units QHS, Humalog 10-16 units TID with meals, Metformin XR 1000 mg BID Current orders for Inpatient glycemic control: Semglee 35 units QHS, Novolog 0-6 units TID with meals, Novolog 0-5 units QHS  Inpatient Diabetes Program Recommendations:    Insulin: Please consider increasing Semglee to 38 units QHS and ordering Novolog 3 units TID with meals for meal coverage if patient eats at least 50% of meals.  Thanks, Barnie Alderman, RN, MSN, [place] Diabetes Coordinator Inpatient Diabetes Program [phone] (Team Pager from 8am to [place])

## 2022-09-27 NOTE — Evaluation (Signed)
Physical Therapy Evaluation Patient Details Name: Shawn Velazquez MRN: [id number] DOB: 1965-07-20 Today's Date: 09/27/2022  History of Present Illness  Shawn Velazquez  is a 57 y.o. male significant for ongoing alcohol abuse, alcoholic liver cirrhosis, diabetes mellitus, hypertension, anxiety, tobacco abuse and recurrent GI bleed in the setting of esophageal varices who presents to the ED with hematemesis--patient recently had EGD on 08/26/2022 with Varices that were banded  -As per patient hematemesis started around 530 this morning patient had bright red blood in emesis bag in the ED  -Patient admits to ongoing daily EtOH use   Clinical Impression  Patient functioning near baseline for functional mobility and gait demonstrating good return for bed mobility, transfers and ambulating in room/hallway without loss of balance.  Patient encouraged to ambulate as tolerated in room and with nursing staff in hallway.  Plan:  Patient discharged from physical therapy to care of nursing for ambulation daily as tolerated for length of stay.         Recommendations for follow up therapy are one component of a multi-disciplinary discharge planning process, led by the attending physician.  Recommendations may be updated based on patient status, additional functional criteria and insurance authorization.  Follow Up Recommendations       Assistance Recommended at Discharge    Patient can return home with the following  Help with stairs or ramp for entrance;Assistance with cooking/housework;A little help with bathing/dressing/bathroom    Equipment Recommendations None recommended by PT  Recommendations for Other Services       Functional Status Assessment Patient has not had a recent decline in their functional status     Precautions / Restrictions Precautions Precautions: None Restrictions Weight Bearing Restrictions: No      Mobility  Bed Mobility Overal bed mobility: Modified Independent                   Transfers Overall transfer level: Modified independent                      Ambulation/Gait Ambulation/Gait assistance: Modified independent (Device/Increase time) Gait Distance (Feet): 100 Feet Assistive device: None Gait Pattern/deviations: WFL(Within Functional Limits) Gait velocity: decreased     General Gait Details: grossly WFL with slightly labored cadence without loss of balance  Stairs            Wheelchair Mobility    Modified Rankin (Stroke Patients Only)       Balance Overall balance assessment: No apparent balance deficits (not formally assessed)                                           Pertinent Vitals/Pain Pain Assessment Pain Assessment: 0-10 Pain Score: 7  Pain Location: buttocks Pain Descriptors / Indicators: Sore Pain Intervention(s): Limited activity within patient's tolerance, Monitored during session, Repositioned    Home Living Family/patient expects to be discharged to:: Private residence Living Arrangements: Other relatives;Non-relatives/Friends Available Help at Discharge: Family;Friend(s);Available 24 hours/day Type of Home: House Home Access: Level entry       Home Layout: One level Home Equipment: Conservation officer, nature (2 wheels);Cane - single point;Grab bars - tub/shower;Shower seat      Prior Function Prior Level of Function : Independent/Modified Independent             Mobility Comments: Community ambulator without AD, does not drive ADLs Comments: Independent  Hand Dominance        Extremity/Trunk Assessment   Upper Extremity Assessment Upper Extremity Assessment: Overall WFL for tasks assessed    Lower Extremity Assessment Lower Extremity Assessment: Overall WFL for tasks assessed    Cervical / Trunk Assessment Cervical / Trunk Assessment: Normal  Communication   Communication: No difficulties  Cognition Arousal/Alertness: Awake/alert Behavior During  Therapy: WFL for tasks assessed/performed Overall Cognitive Status: Within Functional Limits for tasks assessed                                          General Comments      Exercises     Assessment/Plan    PT Assessment Patient does not need any further PT services  PT Problem List         PT Treatment Interventions      PT Goals (Current goals can be found in the Care Plan section)  Acute Rehab PT Goals Patient Stated Goal: return home with family, girl friend to assist PT Goal Formulation: With patient Time For Goal Achievement: 09/27/22 Potential to Achieve Goals: Good    Frequency       Co-evaluation               AM-PAC PT "6 Clicks" Mobility  Outcome Measure Help needed turning from your back to your side while in a flat bed without using bedrails?: None Help needed moving from lying on your back to sitting on the side of a flat bed without using bedrails?: None Help needed moving to and from a bed to a chair (including a wheelchair)?: None Help needed standing up from a chair using your arms (e.g., wheelchair or bedside chair)?: None Help needed to walk in hospital room?: None Help needed climbing 3-5 steps with a railing? : A Little 6 Click Score: 23    End of Session   Activity Tolerance: Patient tolerated treatment well Patient left: in chair;with call bell/phone within reach Nurse Communication: Mobility status PT Visit Diagnosis: Unsteadiness on feet (R26.81);Other abnormalities of gait and mobility (R26.89)    Time: [id number] PT Time Calculation (min) (ACUTE ONLY): 22 min   Charges:   PT Evaluation $PT Eval Moderate Complexity: 1 Mod PT Treatments $Therapeutic Activity: 8-22 mins        3:59 PM, 09/27/22 Lonell Grandchild, MPT Physical Therapist with [facility] [phone] office [phone] mobile phone

## 2022-09-27 NOTE — Progress Notes (Signed)
PROGRESS NOTE     Shawn Velazquez, is a 57 y.o. male, DOB - February 20, 1966, [id number]  Admit date - 09/24/2022   Admitting Physician Courage Denton Brick, MD  Outpatient Primary MD for the patient is Celene Squibb, MD  LOS - 2  Chief Complaint  Patient presents with   Emesis        Brief Narrative:  Shawn Velazquez is a 57y.o. male significant for ongoing alcohol abuse, alcoholic liver cirrhosis, diabetes mellitus, hypertension, anxiety, tobacco abuse and recurrent GI bleed in the setting of esophageal varices admitted on 09/24/2022 with  hematemesis-found to have esophageal varices with bleeding and banded during EGD on 09/24/2022 - -Octreotide and Iv protonix thru -  09/27/22 -Possible discharge 26/2024 if hemodynamically stable  Subjective: The patient was seen and examined this morning, stable no acute distress, Denies of having any active bleeding   Assessment and Plan: 1)Acute GI bleed/hematemesis-- EGD on 09/24/2022 shows Esophageal varices with bleeding stigmata, "status  Post" esophageal band ligation -Previous  EGD on 08/26/2022 with Varices that were banded - -IV Protonix and IV octreotide for another 24 hours until 09/27/2022 -GI following closely  -Antibiotics for total 5 days for SBP prophylaxis in the patient with cirrhosis and GI bleed -Currently on IV Rocephin day 4/7 days  -EtOH abuse with variceal bleeding -Status post EGD with banding -Octreotide and Iv protonix thru Monday 09/27/22 -Possible discharge 09/28/2022 if hemodynamically stable   2)Acute anemia due to acute blood loss/GI bleed -Please see #1 above -Initial hemoglobin was 10.2 repeat hemoglobin 8.9, hemoglobin was 11.2 on 09/16/2022 -INR peaked at 2.3, it was 1.7 on 08/29/2022 -INR started to trend down after couple doses of vitamin K -Monitor H&H and transfuse as clinically indicated      Latest Ref Rng & Units 09/27/2022   10:10 AM 09/27/2022    8:40 AM 09/27/2022    4:21 AM  CBC  WBC 4.0 -  10.5 K/uL   7.5   Hemoglobin 13.0 - 17.0 g/dL  8.0  7.8   Hematocrit 39.0 - 52.0 %  23.5  22.3   Platelets 150 - 400 K/uL 27   19       3)Alcoholic liver Cirrhosis---- =- cont. Lasix and Aldactone, Lactulose  -Transaminitis consistent with EtOH abuse pattern -GI recommending holding Coreg until discharge -Monitor closely -Please see #2 above   4) ongoing alcohol abuse--- continues to ETOH drink daily -High risk for DTs -Benzos thiamine and folic acid and multivitamin as ordered per CIWA protocol -Despite persuasion from patient's [name] at bedside patient is not interested in alcohol rehab services   5) hyponatremia/hypokalemia-- multifactorial in the setting of alcohol abuse and liver cirrhosis -Avoid excessive free water -Replace potassium -Check magnesium   6)DM2- Use Novolog/Humalog Sliding scale insulin with Accu-Cheks/Fingersticks as ordered  -Lantus insulin as ordered   7) social/ethics--patient is a full code  8) acute on chronic anemia and acute on chronic thrombocytopenia--- patient with chronic anemia currently pending and due to underlying liver cirrhosis  -Platelets are very low (24K>>> 27K ) due to combination of toxic effect of alcohol on bone marrow and thrombocytopenia of consumption due to GI bleed -Hgb is low due to GI blood loss/ABLA -Monitor closely and transfuse as indicated -Consider transfusing platelets if less than 20 K  Status is: Inpatient   Disposition: The patient is from: Home              Anticipated d/c is to: Home  Anticipated d/c date is: 1 Day                Patient currently is not medically stable to d/c.  Barriers: Not Clinically Stable-   Code Status :  -  Code Status: Full Code   Family Communication:    Discussed with patient's [name] at bedside  DVT Prophylaxis  :   - SCDs   SCDs Start: 09/24/22 1715 Place TED hose Start: 09/24/22 1715   Lab Results  Component Value Date   PLT 27 (LL) 09/27/2022     Inpatient Medications  Scheduled Meds:  sodium chloride   Intravenous Once   Chlorhexidine Gluconate Cloth  6 each Topical Q2200   FLUoxetine  40 mg Oral Daily   folic acid  1 mg Oral Daily   furosemide  40 mg Oral Daily   insulin aspart  0-5 Units Subcutaneous QHS   insulin aspart  0-6 Units Subcutaneous TID WC   insulin glargine-yfgn  35 Units Subcutaneous QHS   lactulose  10 g Oral BID   multivitamin with minerals  1 tablet Oral Daily   pantoprazole  40 mg Oral BID   sodium chloride flush  3 mL Intravenous Q12H   spironolactone  100 mg Oral Daily   sucralfate  1 g Oral TID WC & HS   thiamine  100 mg Oral Daily   Or   thiamine  100 mg Intravenous Daily   Continuous Infusions:  cefTRIAXone (ROCEPHIN)  IV 2 g (09/27/22 1044)   octreotide (SANDOSTATIN) 500 mcg in sodium chloride 0.9 % 250 mL (2 mcg/mL) infusion 50 mcg/hr (09/27/22 0431)   PRN Meds:.acetaminophen **OR** acetaminophen, albuterol, LORazepam **OR** LORazepam, ondansetron **OR** ondansetron (ZOFRAN) IV, mouth rinse, polyethylene glycol   Anti-infectives (From admission, onward)    Start     Dose/Rate Route Frequency Ordered Stop   09/25/22 1000  cefTRIAXone (ROCEPHIN) 2 g in sodium chloride 0.9 % 100 mL IVPB        2 g 200 mL/hr over 30 Minutes Intravenous Every 24 hours 09/24/22 1306     09/24/22 1300  cefTRIAXone (ROCEPHIN) 2 g in sodium chloride 0.9 % 100 mL IVPB        2 g 200 mL/hr over 30 Minutes Intravenous  Once 09/24/22 1256 09/24/22 1343          Objective: Vitals:   09/26/22 1900 09/26/22 2026 09/27/22 0200 09/27/22 0600  BP: 120/74 114/73 113/77 105/63  Pulse: 88 90 93 93  Resp: 15 18 16 15   Temp:  98.3 F (36.8 C) 98.6 F (37 C) 98.8 F (37.1 C)  TempSrc:  Oral Oral Oral  SpO2: 96% 94% 96% 95%  Weight:  92.6 kg    Height:  5\' 6"  (1.676 m)      Intake/Output Summary (Last 24 hours) at 09/27/2022 1304 Last data filed at 09/27/2022 0431 Gross per 24 hour  Intake 773.6 ml  Output  2400 ml  Net -1626.4 ml   Filed Weights   09/24/22 1827 09/25/22 0510 09/26/22 2026  Weight: 92.7 kg 93 kg 92.6 kg         General:  AAO x 3,  cooperative, no distress; +1 tremors  HEENT:  Normocephalic, PERRL, otherwise with in Normal limits   Neuro:  CNII-XII intact. , normal motor and sensation, reflexes intact   Lungs:   Clear to auscultation BL, Respirations unlabored,  No wheezes / crackles  Cardio:    S1/S2, RRR, No murmure, No  Rubs or Gallops   Abdomen:  Soft, non-tender, bowel sounds active all four quadrants, no guarding or peritoneal signs.  Muscular  skeletal:  Limited exam -global generalized weaknesses - in bed, able to move all 4 extremities,   2+ pulses,  symmetric, No pitting edema  Skin:  Dry, warm to touch, negative for any Rashes,  Wounds: Please see nursing documentation          Data Reviewed: I have personally reviewed following labs and imaging studies  CBC: Recent Labs  Lab 09/24/22 1235 09/24/22 1240 09/24/22 1633 09/25/22 0657 09/26/22 0358 09/27/22 0421 09/27/22 0840 09/27/22 1010  WBC 12.8*  --  14.3* 8.3 8.1 7.5  --   --   HGB 9.6*   < > 8.9* 8.2* 8.5* 7.8* 8.0*  --   HCT 27.9*   < > 26.5* 23.4* 24.1* 22.3* 23.5*  --   MCV 100.4*  --  100.8* 101.3* 103.9* 101.8*  --   --   PLT 118*  --  70* 48* 24* 19*  --  27*   < > = values in this interval not displayed.   Basic Metabolic Panel: Recent Labs  Lab 09/24/22 1235 09/24/22 1240 09/25/22 0410 09/26/22 0358 09/27/22 0421  NA 132* 135 130* 130* 130*  K 3.8 4.1 5.1 3.2* 3.0*  CL 94* 93* 94* 94* 94*  CO2 24  --  26 27 28   GLUCOSE 264* 252* 312* 187* 221*  BUN 11 11 18 20 14   CREATININE 0.87 1.00 0.66 0.83 0.95  CALCIUM 7.8*  --  7.4* 7.5* 7.2*  MG  --   --   --   --  1.4*  PHOS  --   --   --   --  1.9*   GFR: Estimated Creatinine Clearance: 90.3 mL/min (by C-G formula based on SCr of 0.95 mg/dL). Liver Function Tests: Recent Labs  Lab 09/24/22 1235 09/25/22 0410  09/26/22 0358 09/27/22 0421  AST 74* 85* 68*  --   ALT 28 27 25   --   ALKPHOS 238* 203* 181*  --   BILITOT 3.9* 4.1* 3.2*  --   PROT 5.7* 5.3* 5.2*  --   ALBUMIN 2.3* 2.2* 2.1* 2.0*   Recent Results (from the past 240 hour(s))  MRSA Next Gen by PCR, Nasal     Status: None   Collection Time: 09/24/22  6:34 PM   Specimen: Nasal Mucosa; Nasal Swab  Result Value Ref Range Status   MRSA by PCR Next Gen NOT DETECTED NOT DETECTED Final    Comment: (NOTE) The GeneXpert MRSA Assay (FDA approved for NASAL specimens only), is one component of a comprehensive MRSA colonization surveillance program. It is not intended to diagnose MRSA infection nor to guide or monitor treatment for MRSA infections. Test performance is not FDA approved in patients less than 7 years old. Performed at [facility], [address], [place], [place] [address]      Scheduled Meds:  sodium chloride   Intravenous Once   Chlorhexidine Gluconate Cloth  6 each Topical Q2200   FLUoxetine  40 mg Oral Daily   folic acid  1 mg Oral Daily   furosemide  40 mg Oral Daily   insulin aspart  0-5 Units Subcutaneous QHS   insulin aspart  0-6 Units Subcutaneous TID WC   insulin glargine-yfgn  35 Units Subcutaneous QHS   lactulose  10 g Oral BID   multivitamin with minerals  1 tablet Oral Daily  pantoprazole  40 mg Oral BID   sodium chloride flush  3 mL Intravenous Q12H   spironolactone  100 mg Oral Daily   sucralfate  1 g Oral TID WC & HS   thiamine  100 mg Oral Daily   Or   thiamine  100 mg Intravenous Daily   Continuous Infusions:  cefTRIAXone (ROCEPHIN)  IV 2 g (09/27/22 1044)   octreotide (SANDOSTATIN) 500 mcg in sodium chloride 0.9 % 250 mL (2 mcg/mL) infusion 50 mcg/hr (09/27/22 0431)     LOS: 2 days    Deatra James M.D on 09/27/2022 at 1:04 PM  Go to [email or web address] - for contact info  [facility] - Office  [phone]  If 7PM-7AM, please contact night-coverage [email or web address] 09/27/2022, 1:04  PM

## 2022-09-27 NOTE — Progress Notes (Signed)
Patient slept well all night and had an uneventful night, patient transferred to 2A at around 9 PM from ICU on 09/26/2022, patient is drowsy and A+O*3, bed alarm on, call bell within reach, reoriented the patient. CIWAA assessment every 6 hours, has a continuous octreotide infusion. Urine sample sent for analysis, labs resulted, platelets critical, MD made aware, will continue to monitor and will endorse to the oncoming nurse.

## 2022-09-27 NOTE — TOC Initial Note (Signed)
Transition of Care [facility]) - Initial/Assessment Note    Patient Details  Name: Shawn Velazquez MRN: [id number] Date of Birth: 1966-04-27  Transition of Care [facility]) CM/SW Contact:    Iona Beard, [place] Phone Number: 09/27/2022, 11:23 AM  Clinical Narrative:                 Pt is high risk for readmission. CSW spoke with pt in room to complete assessment. Pt states that he lives alone. Pt is independent in completing his ADLs. Pt does not drive but states that family is able to assist in getting to places as needed. Pt has not had HH nor does he use any DME. Pt states that he does not need any substance use resources at this time.   Expected Discharge Plan: Home/Self Care Barriers to Discharge: Continued Medical Work up   Patient Goals and CMS Choice Patient states their goals for this hospitalization and ongoing recovery are:: return home CMS Medicare.gov Compare Post Acute Care list provided to:: Patient Choice offered to / list presented to : Patient      Expected Discharge Plan and Services In-house Referral: Clinical Social Work Discharge Planning Services: CM Consult   Living arrangements for the past 2 months: Single Family Home                                      Prior Living Arrangements/Services Living arrangements for the past 2 months: Single Family Home Lives with:: Self Patient language and need for interpreter reviewed:: Yes Do you feel safe going back to the place where you live?: Yes      Need for Family Participation in Patient Care: Yes (Comment) Care giver support system in place?: Yes (comment)   Criminal Activity/Legal Involvement Pertinent to Current Situation/Hospitalization: No - Comment as needed  Activities of Daily Living      Permission Sought/Granted                  Emotional Assessment Appearance:: Appears stated age Attitude/Demeanor/Rapport: Engaged Affect (typically observed): Accepting Orientation: : Oriented  to Self, Oriented to Place, Oriented to  Time, Oriented to Situation Alcohol / Substance Use: Not Applicable Psych Involvement: No (comment)  Admission diagnosis:  Gastrointestinal hemorrhage with hematemesis [K92.0] Acute GI bleeding [K92.2] Patient Active Problem List   Diagnosis Date Noted   Thrombocytopenia 09/26/2022   Acute GI bleeding 09/25/2022   Gastrointestinal hemorrhage with hematemesis 09/24/2022   Encephalopathy, hepatic ([place]) 08/28/2022   Secondary esophageal varices with bleeding ([place]) 08/27/2022   ABLA (acute blood loss anemia) 08/26/2022   Upper GI bleed 08/25/2022   Hyponatremia 08/25/2022   DM (diabetes mellitus) ([place]) 08/25/2022   Prolonged QT interval 08/25/2022   Symptomatic anemia [id number]   Alcoholic hepatitis with ascites [id number]   Alcoholic cirrhosis of liver with ascites ([place]) 08/03/2022   Hypokalemia 08/03/2022   Anasarca 08/02/2022   Alcohol abuse 08/02/2022   Lower abdominal pain 05/12/2022   Elevated LFTs 05/12/2022   Class 1 obesity 10/29/2021   Anxiety and depression 08/01/2021   Acute bronchitis 07/21/2021   Respiratory illness 07/21/2021   Uncontrolled type 2 diabetes mellitus with hyperglycemia ([place]) 07/01/2020   Anxiety 07/01/2020   Hypertension, essential 07/01/2020   Alcohol use disorder, moderate, dependence ([place]) 07/01/2020   Hyperlipidemia 07/01/2020   Screening for colon cancer 07/01/2020   HNP (herniated nucleus pulposus), thoracic 04/03/2014   PCP:  [place] Crane,  Edwinna Areola, MD Pharmacy:   [facility], [place] [address] AT [address] [place] [address] Phone: [phone] Fax: [phone]     Social Determinants of Health (SDOH) Social History: SDOH Screenings   Food Insecurity: No Food Insecurity (08/31/2022)  Housing: Low Risk  (08/26/2022)  Transportation Needs: No Transportation Needs (08/31/2022)  Utilities: Not At Risk (08/26/2022)   Depression (PHQ2-9): Medium Risk (08/18/2022)  Tobacco Use: High Risk (09/24/2022)   SDOH Interventions:     Readmission Risk Interventions    09/27/2022   11:21 AM 08/26/2022   11:17 AM  Readmission Risk Prevention Plan  Transportation Screening Complete Complete  Home Care Screening  Complete  Medication Review (RN CM)  Complete  HRI or Home Care Consult Complete   Social Work Consult for [place] Planning/Counseling Complete   Palliative Care Screening Not Applicable   Medication Review Press photographer) Complete

## 2022-09-27 NOTE — Progress Notes (Signed)
Date and time results received: 09/27/22 1042 (use smartphrase ".now" to insert current time)  Test: Platelets Critical Value: 29  Name of Provider Notified: Shahmedi Orders Received? Or Actions Taken?:  No orders received. Physician notified via secure chat and [place].

## 2022-09-27 NOTE — Progress Notes (Addendum)
Gastroenterology Progress Note    Primary Gastroenterologist:  Dr. Abbey Chatters  Patient ID: Shawn Velazquez; [id number]; [birth year]    Subjective   No abdominal pain, N/V. Fair appetite. No overt GI bleeding. States some confusion intermittently but unable to elaborate. Oriented X 4.    Objective   Vital signs in last 24 hours Temp:  [98.1 F (36.7 C)-98.8 F (37.1 C)] 98.8 F (37.1 C) (03/25 0600) Pulse Rate:  [88-93] 93 (03/25 0600) Resp:  [15-18] 15 (03/25 0600) BP: (105-120)/(63-77) 105/63 (03/25 0600) SpO2:  [94 %-96 %] 95 % (03/25 0600) Weight:  [92.6 kg] 92.6 kg (03/24 2026) Last BM Date : 09/25/22  Physical Exam General:   Alert and oriented, pleasant, flat affect  Head:  Normocephalic and atraumatic. Abdomen:  Bowel sounds present, full./distended but soft. Small umbilical hernia, no TTP.  Extremities:  With 2+ lower extremity edema. Neurologic:  oriented x4 but drowsy;  grossly normal neurologically. Negative asterixis   Intake/Output from previous day: 03/24 0701 - 03/25 0700 In: 1509.1 [P.O.:660; I.V.:848.7; IV Piggyback:0.5] Out: 2700 [Urine:2700] Intake/Output this shift: No intake/output data recorded.  Lab Results  Recent Labs    09/25/22 0657 09/26/22 0358 09/27/22 0421 09/27/22 0840  WBC 8.3 8.1 7.5  --   HGB 8.2* 8.5* 7.8* 8.0*  HCT 23.4* 24.1* 22.3* 23.5*  PLT 48* 24* 19*  --    BMET Recent Labs    09/25/22 0410 09/26/22 0358 09/27/22 0421  NA 130* 130* 130*  K 5.1 3.2* 3.0*  CL 94* 94* 94*  CO2 26 27 28   GLUCOSE 312* 187* 221*  BUN 18 20 14   CREATININE 0.66 0.83 0.95  CALCIUM 7.4* 7.5* 7.2*   LFT Recent Labs    09/24/22 1235 09/25/22 0410 09/26/22 0358 09/27/22 0421  PROT 5.7* 5.3* 5.2*  --   ALBUMIN 2.3* 2.2* 2.1* 2.0*  AST 74* 85* 68*  --   ALT 28 27 25   --   ALKPHOS 238* 203* 181*  --   BILITOT 3.9* 4.1* 3.2*  --    PT/INR Recent Labs    09/26/22 0358 09/27/22 0421  LABPROT 21.9* 19.7*  INR 1.9* 1.7*      Assessment  57 y.o. male with a history of cirrhosis due to ETOH, portal hypertension, encephalopathy, alcoholic hepatitis, presenting with acute variceal bleeding this admission. Emergent EGD 09/24/22 with 4 columns Grade 2-3 esophageal varices with bleeding stigmata s/p banding.   Decompensated cirrhosis with variceal bleed: MELD 3.0 was 24 yesterday. Hgb 8.0 today. No blood transfusion this admission. No overt GI bleeding. Octreotide infusion can be discontinued this afternoon, as it will be 72 hours post-EGD. Needs 7 day course of antibiotics. May transition to Cipro 500 mg po BID if discharged prior to this to complete full 7 days.   Alcoholic hepatitis: DF with PT control of 13 was 63 yesterday. Prednisolone was not started this admission in light of mild leukocytosis and ongoing alcohol use. Previously had course of prednisolone during January admission and failed to follow-up for repeat labs as outpatient. Compliance a concern.   Anasarca: with lower extremity edema. Abdomen distended but non-tense. Currently on Lasix 40 mg and Aldactone 100 mg daily. Potassium replacement per attending. Potassium 3.0 today. Need daily weights. He has had 2.7 liters urine output in past 24 hours.   Thrombocytopenia: platelets 19 today, down from 24. Trending down this admission, previously 118>70>48>24>19.    Plan / Recommendations  Continue octreotide through this afternoon Recheck platelets (add  on to recent H/H repeated this morning): if remains profoundly low, may need infusion HFP, INR in am Resume lactulose Daily weights Holding off on prednisolone due to compliance concerns Complete 7 day course of antibiotics; may transition to oral to complete if discharged prior Needs EGD in 4 weeks Absolute ETOH cessation. Guarded prognosis Resume carvedilol at discharge    LOS: 2 days    09/27/2022, 9:36 AM  [name], PhD, ANP-BC [facility]   Addendum: Platelet recheck  27, similar to yesterday morning. Continue to follow closely.

## 2022-09-28 ENCOUNTER — Telehealth: Payer: Self-pay | Admitting: Gastroenterology

## 2022-09-28 ENCOUNTER — Other Ambulatory Visit: Payer: Self-pay | Admitting: *Deleted

## 2022-09-28 DIAGNOSIS — K920 Hematemesis: Secondary | ICD-10-CM | POA: Diagnosis not present

## 2022-09-28 DIAGNOSIS — D696 Thrombocytopenia, unspecified: Secondary | ICD-10-CM

## 2022-09-28 DIAGNOSIS — D539 Nutritional anemia, unspecified: Secondary | ICD-10-CM

## 2022-09-28 DIAGNOSIS — I8510 Secondary esophageal varices without bleeding: Secondary | ICD-10-CM | POA: Diagnosis not present

## 2022-09-28 DIAGNOSIS — I8511 Secondary esophageal varices with bleeding: Secondary | ICD-10-CM

## 2022-09-28 DIAGNOSIS — D649 Anemia, unspecified: Secondary | ICD-10-CM

## 2022-09-28 DIAGNOSIS — K7031 Alcoholic cirrhosis of liver with ascites: Secondary | ICD-10-CM

## 2022-09-28 DIAGNOSIS — K7290 Hepatic failure, unspecified without coma: Secondary | ICD-10-CM | POA: Diagnosis not present

## 2022-09-28 LAB — TYPE AND SCREEN
ABO/RH(D): A POS
Antibody Screen: NEGATIVE
Unit division: 0
Unit division: 0

## 2022-09-28 LAB — GLUCOSE, CAPILLARY
Glucose-Capillary: 173 mg/dL — ABNORMAL HIGH (ref 70–99)
Glucose-Capillary: 321 mg/dL — ABNORMAL HIGH (ref 70–99)
Glucose-Capillary: 171 mg/dL — ABNORMAL HIGH (ref 70–99)

## 2022-09-28 LAB — COMPREHENSIVE METABOLIC PANEL
ALT: 36 U/L (ref 0–44)
AST: 128 U/L — ABNORMAL HIGH (ref 15–41)
Albumin: 2.3 g/dL — ABNORMAL LOW (ref 3.5–5.0)
Alkaline Phosphatase: 209 U/L — ABNORMAL HIGH (ref 38–126)
Anion gap: 9 (ref 5–15)
BUN: 10 mg/dL (ref 6–20)
CO2: 26 mmol/L (ref 22–32)
Calcium: 7.6 mg/dL — ABNORMAL LOW (ref 8.9–10.3)
Chloride: 96 mmol/L — ABNORMAL LOW (ref 98–111)
Creatinine, Ser: 0.83 mg/dL (ref 0.61–1.24)
GFR, Estimated: >60 mL/min (ref >60)
Glucose, Bld: 203 mg/dL — ABNORMAL HIGH (ref 70–99)
Potassium: 3.7 mmol/L (ref 3.5–5.1)
Sodium: 131 mmol/L — ABNORMAL LOW (ref 135–145)
Total Bilirubin: 2.7 mg/dL — ABNORMAL HIGH (ref 0.3–1.2)
Total Protein: 5.6 g/dL — ABNORMAL LOW (ref 6.5–8.1)

## 2022-09-28 LAB — PROTIME-INR
INR: 1.5 — ABNORMAL HIGH (ref 0.8–1.2)
Prothrombin Time: 18.3 seconds — ABNORMAL HIGH (ref 11.4–15.2)

## 2022-09-28 LAB — MAGNESIUM: Magnesium: 1.7 mg/dL (ref 1.7–2.4)

## 2022-09-28 LAB — BPAM RBC
Blood Product Expiration Date: 202404182359
Blood Product Expiration Date: 202404192359
Unit Type and Rh: 6200
Unit Type and Rh: 6200

## 2022-09-28 LAB — PHOSPHORUS: Phosphorus: 2.1 mg/dL — ABNORMAL LOW (ref 2.5–4.6)

## 2022-09-28 MED ORDER — CARVEDILOL 3.125 MG PO TABS: 3.1250 mg | ORAL_TABLET | Freq: Two times a day (BID) | ORAL | 2 refills | Status: DC

## 2022-09-28 MED ORDER — SPIRONOLACTONE 50 MG PO TABS: 150.0000 mg | ORAL_TABLET | Freq: Every day | ORAL | 3 refills | Status: DC

## 2022-09-28 MED ORDER — FUROSEMIDE 40 MG PO TABS: 40.0000 mg | ORAL_TABLET | Freq: Every day | ORAL | 2 refills | Status: DC

## 2022-09-28 MED ORDER — POLYETHYLENE GLYCOL 3350 17 G PO PACK: 17.0000 g | PACK | Freq: Every day | ORAL | 0 refills | Status: DC | PRN

## 2022-09-28 MED ORDER — FOLIC ACID 1 MG PO TABS: 1.0000 mg | ORAL_TABLET | Freq: Every day | ORAL | 0 refills | Status: DC

## 2022-09-28 MED ORDER — LACTULOSE 10 GM/15ML PO SOLN: 10.0000 g | Freq: Two times a day (BID) | ORAL | 1 refills | Status: AC

## 2022-09-28 MED ORDER — PANTOPRAZOLE SODIUM 40 MG PO TBEC: 40.0000 mg | DELAYED_RELEASE_TABLET | Freq: Two times a day (BID) | ORAL | 0 refills | Status: DC

## 2022-09-28 MED ORDER — NITROFURANTOIN MONOHYD MACRO 100 MG PO CAPS: 100.0000 mg | ORAL_CAPSULE | Freq: Two times a day (BID) | ORAL | 0 refills | Status: AC

## 2022-09-28 MED ORDER — SPIRONOLACTONE 100 MG PO TABS: 100.0000 mg | ORAL_TABLET | Freq: Every day | ORAL | 1 refills | Status: DC

## 2022-09-28 MED ORDER — SPIRONOLACTONE 25 MG PO TABS: 150.0000 mg | ORAL_TABLET | Freq: Every day | ORAL | Status: DC

## 2022-09-28 NOTE — Telephone Encounter (Signed)
Patient getting discharged today.    Needs 2 week hospital follow-up with Venetia Night, NP. Please arrange.   Needs BMP and CBC in 1 week. Webb Silversmith, CMA please arrange. Dx: Cirrhosis, anemia, variceal bleed

## 2022-09-28 NOTE — Progress Notes (Signed)
PROGRESS NOTE     Shawn Velazquez, is a 57 y.o. male, DOB - [birth year], [id number]  Admit date - 09/24/2022   Admitting Physician Shawn Denton Brick, MD  Outpatient Primary MD for the patient is Shawn Squibb, MD  LOS - 3  Chief Complaint  Patient presents with   Emesis        Brief Narrative:  Shawn Velazquez is a 57y.o. male significant for ongoing alcohol abuse, alcoholic liver cirrhosis, diabetes mellitus, hypertension, anxiety, tobacco abuse and recurrent GI bleed in the setting of esophageal varices admitted on 09/24/2022 with  hematemesis-found to have esophageal varices with bleeding and banded during EGD on 09/24/2022 - -Octreotide and Iv protonix thru -  09/27/22 -Possible discharge 26/2024 if hemodynamically stable  Subjective: The patient was seen and examined this morning, stable no acute distress Reporting on no active rectal bleeding overnight   Assessment and Plan: 1)Acute GI bleed/hematemesis-- EGD on 09/24/2022 shows Esophageal varices with bleeding stigmata, "status  Post" esophageal band ligation -Previous  EGD on 08/26/2022 with Varices that were banded - -still on IV Protonix and IV octreotide --will be switched to p.o. -GI following closely  -Antibiotics for total 5 days for SBP prophylaxis in the patient with cirrhosis and GI bleed -Currently on IV Rocephin day 5/7 days  -EtOH abuse with variceal bleeding -Status post EGD with banding -Octreotide and Iv protonix thru Monday 09/27/22 -Possible discharge 09/28/2022 if hemodynamically stable   2)Acute anemia due to acute blood loss/GI bleed -Please see #1 above -Initial hemoglobin was 10.2 repeat hemoglobin 8.9, hemoglobin was 11.2 on 09/16/2022 -INR peaked at 2.3, it was 1.7 on 08/29/2022 -INR started to trend down after couple doses of vitamin K -Monitor H&H and transfuse as clinically indicated      Latest Ref Rng & Units 09/28/2022    4:38 AM 09/27/2022   10:10 AM 09/27/2022    8:40 AM  CBC  WBC  4.0 - 10.5 K/uL 10.2     Hemoglobin 13.0 - 17.0 g/dL 8.7   8.0   Hematocrit 39.0 - 52.0 % 26.4   23.5   Platelets 150 - 400 K/uL 24  27        3)Alcoholic liver Cirrhosis---- =- cont. Lasix and Aldactone, Lactulose  -Transaminitis consistent with EtOH abuse pattern -GI recommending holding Coreg until discharge -Monitor closely -Please see #2 above      Latest Ref Rng & Units 09/28/2022    4:38 AM 09/27/2022    4:21 AM 09/26/2022    3:58 AM  Hepatic Function  Total Protein 6.5 - 8.1 g/dL 5.6   5.2   Albumin 3.5 - 5.0 g/dL 2.3  2.0  2.1   AST 15 - 41 U/L 128   68   ALT 0 - 44 U/L 36   25   Alk Phosphatase 38 - 126 U/L 209   181   Total Bilirubin 0.3 - 1.2 mg/dL 2.7   3.2       4) ongoing alcohol abuse--- continues to ETOH drink daily -High risk for DTs -Benzos thiamine and folic acid and multivitamin as ordered per CIWA protocol -Despite persuasion from patient's [name] at bedside patient is not interested in alcohol rehab services   5) hyponatremia/hypokalemia-- multifactorial in the setting of alcohol abuse and liver cirrhosis -Avoid excessive free water -Replace potassium -Check magnesium   6)DM2- Use Novolog/Humalog Sliding scale insulin with Accu-Cheks/Fingersticks as ordered  -Lantus insulin as ordered   7) social/ethics--patient is a full code  8) acute on chronic anemia and acute on chronic thrombocytopenia--- patient with chronic anemia currently pending and due to underlying liver cirrhosis  -Platelets are very low (24K>>> 27K>>24K ) due to combination of toxic effect of alcohol on bone marrow and thrombocytopenia of consumption due to GI bleed -Hgb is low due to GI blood loss/ABLA -Monitor closely and transfuse as indicated -Consider transfusing platelets if less than 20 K  Status is: Inpatient   Disposition: The patient is from: Home              Anticipated d/c is to: Home              Anticipated d/c date is: 1 Day                Patient  currently is not medically stable to d/c.  Barriers: Not Clinically Stable-   Code Status :  -  Code Status: Full Code   Family Communication:    Discussed with patient's [name] at bedside  DVT Prophylaxis  :   - SCDs   SCDs Start: 09/24/22 1715 Place TED hose Start: 09/24/22 1715   Lab Results  Component Value Date   PLT 24 (LL) 09/28/2022    Inpatient Medications  Scheduled Meds:  sodium chloride   Intravenous Once   Chlorhexidine Gluconate Cloth  6 each Topical Q2200   FLUoxetine  40 mg Oral Daily   folic acid  1 mg Oral Daily   furosemide  40 mg Oral Daily   insulin aspart  0-5 Units Subcutaneous QHS   insulin aspart  0-6 Units Subcutaneous TID WC   insulin glargine-yfgn  35 Units Subcutaneous QHS   lactulose  10 g Oral BID   multivitamin with minerals  1 tablet Oral Daily   pantoprazole  40 mg Oral BID   sodium chloride flush  3 mL Intravenous Q12H   spironolactone  100 mg Oral Daily   sucralfate  1 g Oral TID WC & HS   thiamine  100 mg Oral Daily   Or   thiamine  100 mg Intravenous Daily   Continuous Infusions:  cefTRIAXone (ROCEPHIN)  IV 2 g (09/28/22 1038)   octreotide (SANDOSTATIN) 500 mcg in sodium chloride 0.9 % 250 mL (2 mcg/mL) infusion 50 mcg/hr (09/28/22 0318)   PRN Meds:.acetaminophen **OR** acetaminophen, albuterol, ondansetron **OR** ondansetron (ZOFRAN) IV, mouth rinse, polyethylene glycol   Anti-infectives (From admission, onward)    Start     Dose/Rate Route Frequency Ordered Stop   09/25/22 1000  cefTRIAXone (ROCEPHIN) 2 g in sodium chloride 0.9 % 100 mL IVPB        2 g 200 mL/hr over 30 Minutes Intravenous Every 24 hours 09/24/22 1306     09/24/22 1300  cefTRIAXone (ROCEPHIN) 2 g in sodium chloride 0.9 % 100 mL IVPB        2 g 200 mL/hr over 30 Minutes Intravenous  Once 09/24/22 1256 09/24/22 1343          Objective: Vitals:   09/27/22 0200 09/27/22 0600 09/27/22 2112 09/28/22 0506  BP: 113/77 105/63 113/78 124/78  Pulse: 93  93 92 92  Resp: 16 15 16 18   Temp: 98.6 F (37 C) 98.8 F (37.1 C) 98.2 F (36.8 C) 98.5 F (36.9 C)  TempSrc: Oral Oral Oral Oral  SpO2: 96% 95% 97% 100%  Weight:      Height:        Intake/Output Summary (Last 24 hours) at 09/28/2022 1108  Last data filed at 09/28/2022 0900 Gross per 24 hour  Intake 360 ml  Output 800 ml  Net -440 ml   Filed Weights   09/24/22 1827 09/25/22 0510 09/26/22 2026  Weight: 92.7 kg 93 kg 92.6 kg       General:  AAO x 3,  cooperative, no distress;   HEENT:  Normocephalic, PERRL, otherwise with in Normal limits   Neuro:  CNII-XII intact. , normal motor and sensation, reflexes intact   Lungs:   Clear to auscultation BL, Respirations unlabored,  No wheezes / crackles  Cardio:    S1/S2, RRR, No murmure, No Rubs or Gallops   Abdomen:  Soft, non-tender, bowel sounds active all four quadrants, no guarding or peritoneal signs.  Muscular  skeletal:  Limited exam -global generalized weaknesses - in bed, able to move all 4 extremities,   2+ pulses,  symmetric, No pitting edema  Skin:  Dry, warm to touch, negative for any Rashes,  Wounds: Please see nursing documentation           Data Reviewed: I have personally reviewed following labs and imaging studies  CBC: Recent Labs  Lab 09/24/22 1633 09/25/22 0657 09/26/22 0358 09/27/22 0421 09/27/22 0840 09/27/22 1010 09/28/22 0438  WBC 14.3* 8.3 8.1 7.5  --   --  10.2  HGB 8.9* 8.2* 8.5* 7.8* 8.0*  --  8.7*  HCT 26.5* 23.4* 24.1* 22.3* 23.5*  --  26.4*  MCV 100.8* 101.3* 103.9* 101.8*  --   --  100.0  PLT 70* 48* 24* 19*  --  27* 24*   Basic Metabolic Panel: Recent Labs  Lab 09/24/22 1235 09/24/22 1240 09/25/22 0410 09/26/22 0358 09/27/22 0421 09/28/22 0438  NA 132* 135 130* 130* 130* 131*  K 3.8 4.1 5.1 3.2* 3.0* 3.7  CL 94* 93* 94* 94* 94* 96*  CO2 24  --  26 27 28 26   GLUCOSE 264* 252* 312* 187* 221* 203*  BUN 11 11 18 20 14 10   CREATININE 0.87 1.00 0.66 0.83 0.95 0.83   CALCIUM 7.8*  --  7.4* 7.5* 7.2* 7.6*  MG  --   --   --   --  1.4* 1.7  PHOS  --   --   --   --  1.9* 2.1*   GFR: Estimated Creatinine Clearance: 103.3 mL/min (by C-G formula based on SCr of 0.83 mg/dL). Liver Function Tests: Recent Labs  Lab 09/24/22 1235 09/25/22 0410 09/26/22 0358 09/27/22 0421 09/28/22 0438  AST 74* 85* 68*  --  128*  ALT 28 27 25   --  36  ALKPHOS 238* 203* 181*  --  209*  BILITOT 3.9* 4.1* 3.2*  --  2.7*  PROT 5.7* 5.3* 5.2*  --  5.6*  ALBUMIN 2.3* 2.2* 2.1* 2.0* 2.3*   Recent Results (from the past 240 hour(s))  MRSA Next Gen by PCR, Nasal     Status: None   Collection Time: 09/24/22  6:34 PM   Specimen: Nasal Mucosa; Nasal Swab  Result Value Ref Range Status   MRSA by PCR Next Gen NOT DETECTED NOT DETECTED Final    Comment: (NOTE) The GeneXpert MRSA Assay (FDA approved for NASAL specimens only), is one component of a comprehensive MRSA colonization surveillance program. It is not intended to diagnose MRSA infection nor to guide or monitor treatment for MRSA infections. Test performance is not FDA approved in patients less than 24 years old. Performed at [facility], [address], [place], [place]  [address]      Scheduled Meds:  sodium chloride   Intravenous Once   Chlorhexidine Gluconate Cloth  6 each Topical Q2200   FLUoxetine  40 mg Oral Daily   folic acid  1 mg Oral Daily   furosemide  40 mg Oral Daily   insulin aspart  0-5 Units Subcutaneous QHS   insulin aspart  0-6 Units Subcutaneous TID WC   insulin glargine-yfgn  35 Units Subcutaneous QHS   lactulose  10 g Oral BID   multivitamin with minerals  1 tablet Oral Daily   pantoprazole  40 mg Oral BID   sodium chloride flush  3 mL Intravenous Q12H   spironolactone  100 mg Oral Daily   sucralfate  1 g Oral TID WC & HS   thiamine  100 mg Oral Daily   Or   thiamine  100 mg Intravenous Daily   Continuous Infusions:  cefTRIAXone (ROCEPHIN)  IV 2 g (09/28/22 1038)   octreotide  (SANDOSTATIN) 500 mcg in sodium chloride 0.9 % 250 mL (2 mcg/mL) infusion 50 mcg/hr (09/28/22 0318)     LOS: 3 days    Deatra James M.D on 09/28/2022 at 11:08 AM  Go to [email or web address] - for contact info  [facility] - Office  [phone]  If 7PM-7AM, please contact night-coverage [email or web address] 09/28/2022, 11:08 AM

## 2022-09-28 NOTE — Discharge Summary (Signed)
Physician Discharge Summary   Patient: Shawn Velazquez MRN: [id number] DOB: 1965/08/08  Admit date:     09/24/2022  Discharge date: 09/28/22  Discharge Physician: Deatra James   PCP: Celene Squibb, MD   Recommendations at discharge:  1. Complete 7-day course of antibiotics for SBP prophylaxis.   2. Holding off on prednisolone for now  3. Continue to monitor H&H with weekly CBC -results to PCP and gastroenterologist 4. Continue to monitor platelets-with weekly labs 6. Increased Aldactone to 150 mg daily. 7. Continue Lasix 40 mg daily. 8. Daily weights. 9. Continue Lactulose twice daily.  Goal of 3-4 bowel movements daily. 10. Repeat EGD in 4 weeks. 11. Resume carvedilol 12. Absolute alcohol cessation. . 2g sodium diet.  Discharge Diagnoses: Principal Problem:   Gastrointestinal hemorrhage with hematemesis Active Problems:   Hyponatremia   Thrombocytopenia   ABLA (acute blood loss anemia)   Uncontrolled type 2 diabetes mellitus with hyperglycemia (HCC)   Hypertension, essential   Alcohol abuse   Symptomatic anemia   Acute GI bleeding   Decompensated hepatic cirrhosis (HCC)    Shawn Velazquez is a 57y.o. male significant for ongoing alcohol abuse, alcoholic liver cirrhosis, diabetes mellitus, hypertension, anxiety, tobacco abuse and recurrent GI bleed in the setting of esophageal varices admitted on 09/24/2022 with  hematemesis-found to have esophageal varices with bleeding and banded during EGD on 09/24/2022 - -Octreotide and Iv protonix thru -  09/28/22 Changed Protonix to PO        1)Acute GI bleed/hematemesis-- EGD on 09/24/2022 shows Esophageal varices with bleeding stigmata, "status  Post" esophageal band ligation -Previous  EGD on 08/26/2022 with Varices that were banded - -still on IV Protonix and IV octreotide --will be switched to p.o. -GI following closely   -Antibiotics for total 5 days for SBP prophylaxis in the patient with cirrhosis and GI  bleed -Currently on IV Rocephin day 5/7 days  -EtOH abuse with variceal bleeding -Status post EGD with banding -Octreotide and Iv protonix thru Monday 09/27/22 -Possible discharge 09/28/2022 if hemodynamically stable   2)Acute anemia due to acute blood loss/GI bleed -Please see #1 above -Initial hemoglobin was 10.2 repeat hemoglobin 8.9, hemoglobin was 11.2 on 09/16/2022 -INR peaked at 2.3, it was 1.7 on 08/29/2022 -INR started to trend down after couple doses of vitamin K -Monitor H&H and transfuse as clinically indicated       Latest Ref Rng & Units 09/28/2022    4:38 AM 09/27/2022   10:10 AM 09/27/2022    8:40 AM  CBC  WBC 4.0 - 10.5 K/uL 10.2       Hemoglobin 13.0 - 17.0 g/dL 8.7    8.0   Hematocrit 39.0 - 52.0 % 26.4    23.5   Platelets 150 - 400 K/uL 24  27             3)Alcoholic liver Cirrhosis---- =- cont. Lasix and Aldactone, Lactulose  -Transaminitis consistent with EtOH abuse pattern -GI recommending holding Coreg until discharge -Monitor closely -Please see #2 above       Latest Ref Rng & Units 09/28/2022    4:38 AM 09/27/2022    4:21 AM 09/26/2022    3:58 AM  Hepatic Function  Total Protein 6.5 - 8.1 g/dL 5.6    5.2   Albumin 3.5 - 5.0 g/dL 2.3  2.0  2.1   AST 15 - 41 U/L 128    68   ALT 0 - 44 U/L 36  25   Alk Phosphatase 38 - 126 U/L 209    181   Total Bilirubin 0.3 - 1.2 mg/dL 2.7    3.2           4) ongoing alcohol abuse--- continues to ETOH drink daily -S/p treatment with Benzos, thiamine and folic acid and multivitamin as ordered per CIWA protocol -Despite persuasion from patient's Sister Shawn Velazquez at bedside patient is not interested in alcohol rehab services   5) hyponatremia/hypokalemia-- multifactorial in the setting of alcohol abuse and liver cirrhosis -Avoid excessive free water -Replace potassium    6)DM2- Use Novolog/Humalog Sliding scale insulin with Accu-Cheks/Fingersticks as ordered  -Lantus insulin as ordered   7)  social/ethics--patient is a full code   8) Acute on chronic anemia and acute on chronic thrombocytopenia--- patient with chronic anemia currently pending and due to underlying liver cirrhosis  -Platelets are very low (24K>>> 27K>>24K ) due to combination of toxic effect of alcohol on bone marrow and thrombocytopenia of consumption due to GI bleed -Hgb is low due to GI blood loss/ABLA       Disposition: The patient is from: Home   Consultants: GI  Procedures performed: EGD  09/24/2022 Disposition: Home Diet recommendation:  Discharge Diet Orders (From admission, onward)     Start     Ordered   09/28/22 0000  Diet - low sodium heart healthy        09/28/22 1557           Clear liquid diet, advance as tolerated DISCHARGE MEDICATION: Allergies as of 09/28/2022       Reactions   Neosporin + Pain Relief Max St [neomy-bacit-polymyx-pramoxine] Rash        Medication List     STOP taking these medications    acetaminophen 325 MG tablet Commonly known as: TYLENOL   meclizine 12.5 MG tablet Commonly known as: ANTIVERT   metFORMIN 500 MG 24 hr tablet Commonly known as: GLUCOPHAGE-XR   montelukast 10 MG tablet Commonly known as: SINGULAIR       TAKE these medications    albuterol 108 (90 Base) MCG/ACT inhaler Commonly known as: VENTOLIN HFA Inhale 2 puffs into the lungs every 6 (six) hours as needed for wheezing or shortness of breath.   carvedilol 3.125 MG tablet Commonly known as: COREG Take 1 tablet (3.125 mg total) by mouth 2 (two) times daily with a meal.   Dexcom G7 Sensor Misc Inject 1 application. into the skin as directed. Change sensor every 10 days as directed.   FLUoxetine 10 MG capsule Commonly known as: PROZAC TAKE 4 CAPSULES BY MOUTH EVERY DAY What changed: See the new instructions.   folic acid 1 MG tablet Commonly known as: FOLVITE Take 1 tablet (1 mg total) by mouth daily. Start taking on: September 29, 2022   furosemide 40 MG  tablet Commonly known as: Lasix Take 1 tablet (40 mg total) by mouth daily.   glucose blood test strip Use as instructed to monitor glucose 4 times daily   insulin lispro 100 UNIT/ML KwikPen Commonly known as: HumaLOG KwikPen Inject 10-16 Units into the skin 3 (three) times daily.   lactulose 10 GM/15ML solution Commonly known as: CHRONULAC Take 15 mLs (10 g total) by mouth 2 (two) times daily.   Lantus SoloStar 100 UNIT/ML Solostar Pen Generic drug: insulin glargine Inject 35 Units into the skin at bedtime.   multivitamin tablet Take 1 tablet by mouth daily.   nitrofurantoin (macrocrystal-monohydrate) 100 MG capsule Commonly known as:  Macrobid Take 1 capsule (100 mg total) by mouth 2 (two) times daily for 7 days.   pantoprazole 40 MG tablet Commonly known as: Protonix Take 1 tablet (40 mg total) by mouth 2 (two) times daily.   Pen Needles 32G X 4 MM Misc 1 each by Does not apply route in the morning, at noon, in the evening, and at bedtime. Use to inject insulin 4 times daily   polyethylene glycol 17 g packet Commonly known as: MIRALAX / GLYCOLAX Take 17 g by mouth daily as needed for mild constipation.   spironolactone 50 MG tablet Commonly known as: ALDACTONE Take 3 tablets (150 mg total) by mouth daily. Start taking on: September 29, 2022 What changed:  medication strength how much to take   sucralfate 1 GM/10ML suspension Commonly known as: CARAFATE Take 10 mLs (1 g total) by mouth 4 (four) times daily -  with meals and at bedtime for 14 days.   thiamine 100 MG tablet Commonly known as: Vitamin B-1 Take 1 tablet (100 mg total) by mouth daily.        Discharge Exam: Filed Weights   09/24/22 1827 09/25/22 0510 09/26/22 2026  Weight: 92.7 kg 93 kg 92.6 kg        General:  AAO x 3,  cooperative, no distress;   HEENT:  Normocephalic, PERRL, otherwise with in Normal limits   Neuro:  CNII-XII intact. , normal motor and sensation, reflexes intact   Lungs:    Clear to auscultation BL, Respirations unlabored,  No wheezes / crackles  Cardio:    S1/S2, RRR, No murmure, No Rubs or Gallops   Abdomen:  Soft, non-tender, bowel sounds active all four quadrants, no guarding or peritoneal signs.  Muscular  skeletal:  Limited exam -global generalized weaknesses - in bed, able to move all 4 extremities,   2+ pulses,  symmetric, No pitting edema  Skin:  Dry, warm to touch, negative for any Rashes,  Wounds: Please see nursing documentation          Condition at discharge: fair  The results of significant diagnostics from this hospitalization (including imaging, microbiology, ancillary and laboratory) are listed below for reference.   Imaging Studies: No results found.  Microbiology: Results for orders placed or performed during the hospital encounter of 09/24/22  MRSA Next Gen by PCR, Nasal     Status: None   Collection Time: 09/24/22  6:34 PM   Specimen: Nasal Mucosa; Nasal Swab  Result Value Ref Range Status   MRSA by PCR Next Gen NOT DETECTED NOT DETECTED Final    Comment: (NOTE) The GeneXpert MRSA Assay (FDA approved for NASAL specimens only), is one component of a comprehensive MRSA colonization surveillance program. It is not intended to diagnose MRSA infection nor to guide or monitor treatment for MRSA infections. Test performance is not FDA approved in patients less than 3 years old. Performed at [facility], [address], [place], [place] [address]     Labs: CBC: Recent Labs  Lab 09/24/22 1633 09/25/22 0657 09/26/22 0358 09/27/22 0421 09/27/22 0840 09/27/22 1010 09/28/22 0438  WBC 14.3* 8.3 8.1 7.5  --   --  10.2  HGB 8.9* 8.2* 8.5* 7.8* 8.0*  --  8.7*  HCT 26.5* 23.4* 24.1* 22.3* 23.5*  --  26.4*  MCV 100.8* 101.3* 103.9* 101.8*  --   --  100.0  PLT 70* 48* 24* 19*  --  27* 24*   Basic Metabolic Panel: Recent Labs  Lab 09/24/22  1235 09/24/22 1240 09/25/22 0410 09/26/22 0358 09/27/22 0421 09/28/22 0438   NA 132* 135 130* 130* 130* 131*  K 3.8 4.1 5.1 3.2* 3.0* 3.7  CL 94* 93* 94* 94* 94* 96*  CO2 24  --  26 27 28 26   GLUCOSE 264* 252* 312* 187* 221* 203*  BUN 11 11 18 20 14 10   CREATININE 0.87 1.00 0.66 0.83 0.95 0.83  CALCIUM 7.8*  --  7.4* 7.5* 7.2* 7.6*  MG  --   --   --   --  1.4* 1.7  PHOS  --   --   --   --  1.9* 2.1*   Liver Function Tests: Recent Labs  Lab 09/24/22 1235 09/25/22 0410 09/26/22 0358 09/27/22 0421 09/28/22 0438  AST 74* 85* 68*  --  128*  ALT 28 27 25   --  36  ALKPHOS 238* 203* 181*  --  209*  BILITOT 3.9* 4.1* 3.2*  --  2.7*  PROT 5.7* 5.3* 5.2*  --  5.6*  ALBUMIN 2.3* 2.2* 2.1* 2.0* 2.3*   CBG: Recent Labs  Lab 09/27/22 1147 09/27/22 1631 09/27/22 2110 09/28/22 0753 09/28/22 1057  GLUCAP 211* 76 147* 173* 321*    Discharge time spent: greater than 40 minutes.  Signed: Deatra James, MD [facility] 09/28/2022

## 2022-09-28 NOTE — Consult Note (Signed)
Subjective: Feels tired. Didn't sleep great. No nausea, vomiting, abdominal pain, brbpr, or melena. Having loose Bms today. Total of 2 so far. This isn't unusual. Usually has 3-4 Bms daily that can range from hard to loose. Lactulose restarted yesterday.   Abdomen is somewhat distended, but no worse compared to when he was admitted.   Was drinking 2 16 oz cans of beer daily prior to admission.   Objective: Vital signs in last 24 hours: Temp:  [98.2 F (36.8 C)-98.5 F (36.9 C)] 98.5 F (36.9 C) (03/26 0506) Pulse Rate:  [92] 92 (03/26 0506) Resp:  [16-18] 18 (03/26 0506) BP: (113-124)/(78) 124/78 (03/26 0506) SpO2:  [97 %-100 %] 100 % (03/26 0506) Last BM Date : 09/27/22 General:   Alert and oriented, pleasant, NAD.  Head:  Normocephalic and atraumatic. Abdomen:  Bowel sounds present. Full/distended abdomen, but soft and non-tender, non-distended.  Extremities:  With 2+ pitting edema below knees, and 1+ pitting in thighs.  Neurologic:  Alert and  oriented x4;  grossly normal neurologically.  No asterixis. Psych:  Normal mood and affect.  Intake/Output from previous day: 03/25 0701 - 03/26 0700 In: -  Out: 800 [Urine:800] Intake/Output this shift: Total I/O In: 360 [P.O.:360] Out: -   Lab Results: Recent Labs    09/26/22 0358 09/27/22 0421 09/27/22 0840 09/27/22 1010 09/28/22 0438  WBC 8.1 7.5  --   --  10.2  HGB 8.5* 7.8* 8.0*  --  8.7*  HCT 24.1* 22.3* 23.5*  --  26.4*  PLT 24* 19*  --  27* 24*   BMET Recent Labs    09/26/22 0358 09/27/22 0421 09/28/22 0438  NA 130* 130* 131*  K 3.2* 3.0* 3.7  CL 94* 94* 96*  CO2 27 28 26   GLUCOSE 187* 221* 203*  BUN 20 14 10   CREATININE 0.83 0.95 0.83  CALCIUM 7.5* 7.2* 7.6*   LFT Recent Labs    09/26/22 0358 09/27/22 0421 09/28/22 0438  PROT 5.2*  --  5.6*  ALBUMIN 2.1* 2.0* 2.3*  AST 68*  --  128*  ALT 25  --  36  ALKPHOS 181*  --  209*  BILITOT 3.2*  --  2.7*   PT/INR Recent Labs     09/27/22 0421 09/28/22 0438  LABPROT 19.7* 18.3*  INR 1.7* 1.5*    Assessment: 57 y.o. male with a history of cirrhosis due to ETOH, portal hypertension, encephalopathy, alcoholic hepatitis, presenting with acute variceal bleeding this admission. Emergent EGD 09/24/22 with 4 columns Grade 2-3 esophageal varices with bleeding stigmata s/p banding.   Decompensated cirrhosis with variceal bleed: MELD 3.0 20 today which is improved.  Hemoglobin stable/improved 8.7 today.  He has not required blood transfusion this admission.  No overt GI bleeding.  Octreotide infusion can be discontinued as it has been more than 72 hrs post EGD.  He will need to complete 7-day course of antibiotics for variceal bleed.  Okay to transition to Cipro 500 mg twice daily if discharged prior to completing the full 7 days.  Alcoholic hepatitis: DF with PT control of 13 is 27.1 today, down from 44, 2 days ago.  Prednisolone was not started this admission in light of mild leukocytosis and ongoing alcohol use.  Previously had course of prednisolone during January admission and felt to follow-up for repeat labs as outpatient.  Compliance is a concern.  Anasarca: Currently on Lasix 40 mg and Aldactone 100 mg daily.  He has had some hypokalemia which  has been corrected.  Sodium mildly low, but stable.  Creatinine within normal limits. Will increase Aldactone to 150 mg daily.  Needs daily weights.  Thrombocytopenia: Trending down this admission, previously 118>70>48>24>19>27>24.   Plan: Discontinue octreotide infusion. Complete 7-day course of antibiotics for SBP prophylaxis.  Can transition to oral to complete if discharged prior. Holding off on prednisolone due to compliance concerns. Continue to monitor H&H and for overt GI bleeding. Continue to monitor platelets. Daily MELD labs. Increase Aldactone to 150 mg daily. Continue Lasix 40 mg daily. Monitor kidney function and electrolytes closely. Daily weights. Continue  lactulose 10 g twice daily.  Goal of 3-4 bowel movements daily. Repeat EGD in 4 weeks. Resume carvedilol at discharge. Absolute alcohol cessation. 2g sodium diet.  Anticipate discharge in the next 24 hours.    LOS: 3 days    09/28/2022, 10:54 AM   Aliene Altes, PA-C [facility]

## 2022-09-28 NOTE — Progress Notes (Signed)
Discharge instructions reviewed with patient and patient's significant other, Susie. Both verbalized understanding of instructions. Patient discharged home in stable condition.

## 2022-09-29 ENCOUNTER — Other Ambulatory Visit: Payer: Self-pay | Admitting: Gastroenterology

## 2022-09-29 ENCOUNTER — Other Ambulatory Visit: Payer: Self-pay | Admitting: *Deleted

## 2022-09-29 ENCOUNTER — Ambulatory Visit: Payer: Self-pay | Admitting: *Deleted

## 2022-09-29 DIAGNOSIS — D649 Anemia, unspecified: Secondary | ICD-10-CM

## 2022-09-29 DIAGNOSIS — I8511 Secondary esophageal varices with bleeding: Secondary | ICD-10-CM

## 2022-09-29 DIAGNOSIS — K922 Gastrointestinal hemorrhage, unspecified: Secondary | ICD-10-CM

## 2022-09-29 DIAGNOSIS — K7031 Alcoholic cirrhosis of liver with ascites: Secondary | ICD-10-CM

## 2022-09-29 DIAGNOSIS — J209 Acute bronchitis, unspecified: Secondary | ICD-10-CM

## 2022-09-29 LAB — CBC
HCT: 26.4 % — ABNORMAL LOW (ref 39.0–52.0)
Hemoglobin: 8.7 g/dL — ABNORMAL LOW (ref 13.0–17.0)
MCH: 33 pg (ref 26.0–34.0)
MCHC: 33 g/dL (ref 30.0–36.0)
MCV: 100 fL (ref 80.0–100.0)
Platelets: 24 10*3/uL — CL (ref 150–400)
RBC: 2.64 MIL/uL — ABNORMAL LOW (ref 4.22–5.81)
RDW: 15 % (ref 11.5–15.5)
WBC: 10.2 10*3/uL (ref 4.0–10.5)
nRBC: 0 % (ref 0.0–0.2)

## 2022-09-29 NOTE — Consult Note (Addendum)
   [facility] CM Inpatient Consult   09/29/2022  Shawn Velazquez [birth year] [id number]  Orientation with [name], [name] for review.   Location: [name] screened remotely (d/c [name]).   [facility]) [place] Patient: Proofreader)    Primary Care Provider:  Celene Squibb, MD with [facility]   Patient screened for readmission hospitalization with noted high risk score for unplanned readmission risk to assess for potential [facility]) Care Management service needs for post hospital transition for care coordination.  Attempted outreach call spoke with [name]) who was receptive to the [name] RN care coordination following up with pt post discharge. States pt has a hard time remembering who he speaks with so she and his girl-friend will probably start taking the pt's calls. [name]) has agreed for pt to received a follow up call via [place] services. No other inquires or questions at this time.   Plan:   HIPAA verified by [name]. Referral request for community care coordination:  Due to high risk score for readmission.   8:11 AM [facility] liaison received call back from pt confirming the above referral through his [name]. Pt receptive to a follow up call from [place] care coordination. No questions or inquires presented. [facility] RN liaison will make a referral for follow up cll.  [place] does not replace or interfere with any arrangements made by the Inpatient Transition of Care team.   For questions contact:   Raina Mina, RN, [place] Hours M-F 8:00 am to 5 pm [phone] mobile [phone] [Office toll free line][facility] Office Hours are M-F 8:30 - 5 pm 24 hour nurse advise line [phone] Conceirge   [email or web address]

## 2022-09-29 NOTE — Telephone Encounter (Signed)
Spoke to pt, informed him to have labs done next week. Pt voiced understanding. Pt states he was told that he could not go back to work and he needs a work note saying he will be out of work indefinitely, so he can file for disability.

## 2022-09-29 NOTE — Telephone Encounter (Signed)
This can be discussed at his follow-up. His girlfriend told me that his hematologist has already provided a note for him to cover for now, but that he would end up needing something from GI. I advised that it could be discussed at follow-up.

## 2022-09-30 ENCOUNTER — Telehealth: Payer: Self-pay | Admitting: *Deleted

## 2022-09-30 ENCOUNTER — Inpatient Hospital Stay: Payer: BC Managed Care – PPO

## 2022-09-30 NOTE — Telephone Encounter (Signed)
Noted  

## 2022-09-30 NOTE — Progress Notes (Signed)
  Care Coordination   Note   09/30/2022 Name: SRIHARSHA MCMAKEN MRN: [id number] DOB: Jun 18, 1966  JAMIEON DOLMAN is a 57 y.o. year old male who sees [place] Crane, Edwinna Areola, MD for primary care. I reached out to Shirlean Mylar by phone today to offer care coordination services.  Mr. Happ was given information about Care Coordination services today including:   The Care Coordination services include support from the care team which includes your Nurse Coordinator, Clinical Social Worker, or Pharmacist.  The Care Coordination team is here to help remove barriers to the health concerns and goals most important to you. Care Coordination services are voluntary, and the patient may decline or stop services at any time by request to their care team member.   Care Coordination Consent Status: Patient agreed to services and verbal consent obtained.   Follow up plan:  Telephone appointment with care coordination team member scheduled for:  10/07/22  Encounter Outcome:  Pt. Scheduled  [place]  Direct Dial: [phone]

## 2022-10-04 ENCOUNTER — Other Ambulatory Visit: Payer: Self-pay | Admitting: Gastroenterology

## 2022-10-04 ENCOUNTER — Telehealth: Payer: Self-pay

## 2022-10-04 MED ORDER — SUCRALFATE 1 GM/10ML PO SUSP: 1.0000 g | Freq: Three times a day (TID) | ORAL | 0 refills | Status: DC

## 2022-10-04 NOTE — Telephone Encounter (Signed)
Pt called requesting an rx for sucralfate to be called in.

## 2022-10-05 DIAGNOSIS — F419 Anxiety disorder, unspecified: Secondary | ICD-10-CM | POA: Diagnosis not present

## 2022-10-05 DIAGNOSIS — F1010 Alcohol abuse, uncomplicated: Secondary | ICD-10-CM | POA: Diagnosis not present

## 2022-10-05 DIAGNOSIS — K9289 Other specified diseases of the digestive system: Secondary | ICD-10-CM | POA: Diagnosis not present

## 2022-10-05 DIAGNOSIS — K7030 Alcoholic cirrhosis of liver without ascites: Secondary | ICD-10-CM | POA: Diagnosis not present

## 2022-10-05 DIAGNOSIS — K7460 Unspecified cirrhosis of liver: Secondary | ICD-10-CM | POA: Diagnosis not present

## 2022-10-05 DIAGNOSIS — I8511 Secondary esophageal varices with bleeding: Secondary | ICD-10-CM | POA: Diagnosis not present

## 2022-10-06 ENCOUNTER — Encounter (HOSPITAL_COMMUNITY): Payer: Self-pay | Admitting: Internal Medicine

## 2022-10-07 ENCOUNTER — Other Ambulatory Visit (HOSPITAL_COMMUNITY): Payer: BC Managed Care – PPO

## 2022-10-07 ENCOUNTER — Ambulatory Visit: Payer: Self-pay | Admitting: *Deleted

## 2022-10-07 NOTE — Patient Outreach (Signed)
  Care Coordination   Follow Up Visit Note   10/07/2022 Name: Shawn Velazquez MRN: [id number] DOB: Sep 10, 1965  Shawn Velazquez is a 57 y.o. year old male who sees [place] Crane, Edwinna Areola, MD for primary care. I spoke with  Shirlean Mylar by phone today.  What matters to the patients health and wellness today?  "It is a day to day thing" Reports the best day he has had was on 10/06/22 since his 09/28/22 discharge Seen by pcp 10/05/22 FMLA concerns   congestive Heart Failure (CHF) taking fluid pills but reports alternating Lost of 10 lb then re gaining 10 lbs  See gastroenterologist soon to have another thoracentesis Taking in fluids well Agrees to follow discussed CHF action plan   Trouble getting "a and s" "FMLA" [employer] um- have not received     Goals Addressed             This Visit's Progress    [facility] care coordination services       Interventions Today    Flowsheet Row Most Recent Value  Chronic Disease   Chronic disease during today's visit Congestive Heart Failure (CHF), Other  [upcoming endoscopy, depression, hydration, CHF management to include weight monitoring and contacting MD for increase weight 5-10 lbs, FMLA via [employer] pending]  General Interventions   General Interventions Discussed/Reviewed General Interventions Discussed, General Interventions Reviewed, [employer], Doctor Visits, Communication with  Doctor Visits Discussed/Reviewed Doctor Visits Discussed, Doctor Visits Reviewed, PCP, Specialist  PCP/Specialist Visits Compliance with follow-up visit  Communication with --  [other [employer] FMLA [place]]  Exercise Interventions   Exercise Discussed/Reviewed Weight Managment  Weight Management Weight maintenance  Education Interventions   Education Provided Provided Education, Provided Forensic psychologist, FMLA process, weight management for CHF Liver disease CHF action plan]  Provided Verbal Education On Nutrition, Mental Health/Coping with  Illness, When to see the doctor, [employer], Other  Mental Health Interventions   Mental Health Discussed/Reviewed Mental Health Discussed, Coping Strategies, Depression  [offer to [facility] SW for resources denied at this time]  Nutrition Interventions   Nutrition Discussed/Reviewed Nutrition Discussed, Fluid intake, Supplmental nutrition  [confirmed his appetite is improving but when he does not feel like eating he is taking protein shakes]  Pharmacy Interventions   Pharmacy Dicussed/Reviewed Pharmacy Topics Discussed, Medications and their functions  [lasix/furosemide]              SDOH assessments and interventions completed:  Yes     Care Coordination Interventions:  Yes, provided   Follow up plan: Follow up call scheduled for 10/15/22    Encounter Outcome:  Pt. Visit Completed    Tyreak Reagle L. Lavina Hamman, RN, BSN, [place] Coordinator Office number [phone]

## 2022-10-07 NOTE — Patient Instructions (Signed)
Visit Information  Thank you for taking time to visit with me today. Please don't hesitate to contact me if I can be of assistance to you.   Following are the goals we discussed today:   Goals Addressed             This Visit's Progress    [facility] care coordination services   Not on track    Interventions Today    Flowsheet Row Most Recent Value  Chronic Disease   Chronic disease during today's visit --  [Introduction, partial TOC Assessed for any immediate needs, received permission to outreach to his [name] as he reports is not a good time for RN [employer].  Denies issues with follow up visit, meds nor transportation to MD visits]  General Interventions   General Interventions Discussed/Reviewed General Interventions Discussed, Doctor Visits  Doctor Visits Discussed/Reviewed Doctor Visits Discussed, PCP, Specialist  PCP/Specialist Visits Compliance with follow-up visit  Education Interventions   Education Provided Provided Education  Provided Verbal Education On Other  [introduction to [place] Interventions   Pharmacy Dicussed/Reviewed Pharmacy Topics Discussed  [confirmed medications obtained]              Our next appointment is by telephone on 10/07/22 at 1000  Please call the care guide team at [phone] if you need to cancel or reschedule your appointment.   If you are experiencing a Mental Health or [place] or need someone to talk to, please call the Suicide and Crisis Lifeline: 988 call the [place] National Suicide Prevention Lifeline: 604-845-3386 or TTY: 903 519 2309 TTY (559)241-2643) to talk to a trained counselor call 1-800-273-TALK (toll free, 24 hour hotline) call the [facility]: (905)293-7783 call 911   The patient verbalized understanding of instructions, educational materials, and care plan provided today and DECLINED offer to receive copy of patient instructions, educational materials, and care plan.    The patient has been provided with contact information for the care management team and has been advised to call with any health related questions or concerns.   Resha Filippone L. Lavina Hamman, RN, BSN, [place] Coordinator Office number [phone]

## 2022-10-07 NOTE — Patient Outreach (Signed)
  Care Coordination   Initial Visit Note   10/07/2022 Late entry for 09/29/22  Name: Shawn Velazquez MRN: [id number] DOB: 07/04/1966  Shawn Velazquez is a 57 y.o. year old male who sees Shawn Velazquez, Shawn Areola, MD for primary care. I spoke with  Shawn Velazquez by phone today.  What matters to the patients health and wellness today?  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  Denies issues with follow up visit, meds nor transportation to MD visits]  General Interventions   General Interventions Discussed/Reviewed General Interventions Discussed, Doctor Visits  Doctor Visits Discussed/Reviewed Doctor Visits Discussed, PCP, Specialist  PCP/Specialist Visits Compliance with follow-up visit  Education Interventions   Education Provided Provided Education  Provided Verbal Education On Other  [introduction to [place] Interventions   Pharmacy Dicussed/Reviewed Pharmacy Topics Discussed  [confirmed medications obtained]              SDOH assessments and interventions completed:  Yes  SDOH Interventions Today    Flowsheet Row Most Recent Value  SDOH Interventions   Food Insecurity Interventions Intervention Not Indicated  Housing Interventions Intervention Not Indicated  Transportation Interventions Intervention Not Indicated  Utilities Interventions Intervention Not Indicated  Depression Interventions/Treatment  Medication  Financial Strain Interventions Other (Comment)   [pending FMLA from [place]  Stress Interventions Other (Comment)  [on depression medication Discussed [facility] counseling resource]        Care Coordination Interventions:  Yes, provided   Follow up plan: Follow up call scheduled for 10/07/22    Encounter Outcome:  Pt. Visit Completed   Shawn Velazquez L. Lavina Hamman, RN, BSN, [place] Coordinator Office number [phone]

## 2022-10-07 NOTE — Patient Instructions (Addendum)
Visit Information  Thank you for taking time to visit with me today. Please don't hesitate to contact me if I can be of assistance to you.   Following are the goals we discussed today:   Goals Addressed             This Visit's Progress    [facility] care coordination services       Interventions Today    Flowsheet Row Most Recent Value  Chronic Disease   Chronic disease during today's visit Congestive Heart Failure (CHF), Other  [upcoming endoscopy, depression, hydration, CHF management to include weight monitoring and contacting MD for increase weight 5-10 lbs, FMLA via [employer] pending]  General Interventions   General Interventions Discussed/Reviewed General Interventions Discussed, General Interventions Reviewed, [employer], Doctor Visits, Communication with  Doctor Visits Discussed/Reviewed Doctor Visits Discussed, Doctor Visits Reviewed, PCP, Specialist  PCP/Specialist Visits Compliance with follow-up visit  Communication with --  [other [employer] HR FMLA [place]]  Exercise Interventions   Exercise Discussed/Reviewed Weight Managment  Weight Management Weight maintenance  Education Interventions   Education Provided Provided Education, Provided Forensic psychologist, FMLA process, weight management for CHF Liver disease CHF action plan]  Provided Verbal Education On Nutrition, Mental Health/Coping with Illness, When to see the doctor, [employer], Other  Mental Health Interventions   Mental Health Discussed/Reviewed Mental Health Discussed, Coping Strategies, Depression  [offer to [employer] SW for resources denied at this time]  Nutrition Interventions   Nutrition Discussed/Reviewed Nutrition Discussed, Fluid intake, Supplmental nutrition  [confirmed his appetite is improving but when he does not feel like eating he is taking protein shakes]  Pharmacy Interventions   Pharmacy Dicussed/Reviewed Pharmacy Topics Discussed, Medications and their functions   [lasix/furosemide]              Our next appointment is by telephone on 10/15/22 at 1000  Please call the care guide team at [phone] if you need to cancel or reschedule your appointment.   If you are experiencing a Mental Health or [place] or need someone to talk to, please call the Suicide and Crisis Lifeline: 988 call the [place] National Suicide Prevention Lifeline: 7863168199 or TTY: 234-563-7333 TTY (507) 003-3543) to talk to a trained counselor call 1-800-273-TALK (toll free, 24 hour hotline) call the [facility]: [phone] call 911   The patient verbalized understanding of instructions, educational materials, and care plan provided today and DECLINED offer to receive copy of patient instructions, educational materials, and care plan.   The patient has been provided with contact information for the care management team and has been advised to call with any health related questions or concerns.   Lincy Belles L. Lavina Hamman, RN, BSN, [place] Coordinator Office number [phone]

## 2022-10-08 ENCOUNTER — Other Ambulatory Visit: Payer: Self-pay | Admitting: Nurse Practitioner

## 2022-10-08 DIAGNOSIS — I10 Essential (primary) hypertension: Secondary | ICD-10-CM

## 2022-10-11 NOTE — Addendum Note (Signed)
Addendum  created 10/11/22 1354 by Moshe Salisbury, CRNA   Clinical Note Signed, Intraprocedure Blocks edited

## 2022-10-11 NOTE — Addendum Note (Signed)
Addendum  created 10/11/22 1417 by Moshe Salisbury, CRNA   Intraprocedure Meds edited

## 2022-10-13 NOTE — Progress Notes (Signed)
GI Office Note    Referring Provider: [name], MD Primary Care Physician:  [name], MD Primary Gastroenterologist: Hennie Duos. Marletta Lor, DO  Date:  10/14/2022  ID:  Shawn Velazquez, DOB Apr 02, 1966, MRN [id number]   Chief Complaint   Chief Complaint  Patient presents with   Follow-up    Is having issues with bloating   History of Present Illness  Shawn Velazquez is a 57 y.o. male with a history of alcoholic liver cirrhosis, ongoing alcohol dependence, sleep apnea, diabetes, HTN presenting today for hospital follow-up.   Alcoholic liver cirrhosis, ongoing alcohol dependence, sleep apnea, diabetes, HTN presenting today for hospital follow-up of decompensated cirrhosis and upper GI bleed   MRI liver with and without contrast 08/03/2022: -Chronic liver disease -Macronodular liver with multiple presumed regenerative nodules -No clear aggressive appearing lesions with restricted diffusion -Mild ascites -Varices on the stomach and lower esophagus -Mild splenic enlargement   Hospitalized at [facility] 08/26/2022 through 08/29/2022: Presented with hematemesis and melena for 3 days.  Noted to have previous admission at the end of January for anasarca.  He underwent EGD for hematemesis and melena during this hospitalization in February.  Procedure outlined below.  He was treated with octreotide and PPI infusion as well as IV Rocephin for SBP prophylaxis.  He was also treated with Carafate 1 g 4 times daily.  His MELD was 27 on admission with a DF of 45.9.  Was treated with prednisolone during his prior hospitalization in January.  Patient has reported intermittent drowsiness and confusion concerning for possible hepatic encephalopathy but has been maintained on lactulose.  Given significant bowel movements while inpatient his lactulose was decreased from 3 times daily to twice daily.  Given he was still having alcohol use prednisolone was not started.  Prior to discharge he was advised to  continue Lasix 20 mg daily 100 mg spironolactone daily.  His MELD 3.0 was 22 on discharge.  Per Dr. Levon Hedger, no need for Xifaxan at this time.  Alcohol cessation encouraged. Recommendations on discharge were to continue Carafate for 2 weeks, PPI twice daily, Lasix 40 mg daily, spironolactone 100 mg daily, lactulose 2-3 bowel movements daily, carvedilol 3.125 mg twice daily.  Need to arrange outpatient evaluation at transplant center given elevated levels.   EGD 08/26/2022: -grade 2 esophageal varices s/p banding x 3 with complete eradication, without bleeding -Single nonbleeding angiodysplastic lesion in the duodenum.  -Advised Carafate suspension 1 g before meals and nightly -Repeat EGD in 4-8 weeks.  Recent hospitalization 09/24/22-09/28/22.  He presented to the ED with hematemesis, having multiple episodes.  Reportedly continued to drink alcohol.  Reports some mild tremors and intermittent confusion but no worse than previous.  Complaining of lower extremity edema and abdominal distention.  History of Rocephin for SBP prophylaxis.  Underwent EGD with Dr. Jena Gauss.  IV twice daily and octreotide infusion.  Continued on lactulose as well as daily diuretic regimen of 40 mg of Lasix and 100 mg of spironolactone.  EGD as outlined below.  Was given vitamin K x 1 dose. Discharged on lasix 40 mg daily and spironolactone 150mg  daily (due to hypokalemia).  Recommended restarting Coreg 3.125 mg twice daily.  EGD 09/24/22: -4 columns of grade 2-3 esophageal varices, cherry red spots 1 on each of 2 columns without active bleeding -Portal hypertensive gastropathy with touch friability, no gastric varices -Microvasive 7 shot Bander patch -1 band placed on each column with good hemostasis -Advise repeat banding in 4 weeks. -Continue  PPI  Today: Swelling is up and down in his extremities.   He feels as though his stomach is bloated all the times. Having about 4 BM daily.    Cirrhosis history Hematemesis/coffee  ground emesis: Recent hematemesis. Mild vomiting this morning after milk. Not daily.  History of variceal bleeding: yes Abdominal pain: none  Abdominal distention/worsening ascites: mild.  Fever/chills: has intermittent chills but not fevers.  Episodes of confusion/disorientation: Having day to day confusion. No hallucinations. Having weird dreams. Not driving.  Number of daily bowel movements: 3-4. Taking lactulose twice daily.  Taking diuretics?: yes.  Date of last EGD: 09/24/22 Prior history of banding?: yes  Prior episodes of SBP: none Last time liver imaging was performed:08/03/22  Adhering to low sodium diet.   Has not had any alcohol since discharge. No significant withdrawal symptoms. 1 cigarette daily.   MELD 3.0: 20 at 09/28/2022  4:38 AM MELD-Na: 20 at 09/28/2022  4:38 AM Calculated from: Serum Creatinine: 0.83 mg/dL (Using min of 1 mg/dL) at 1/61/0960  4:54 AM Serum Sodium: 131 mmol/L at 09/28/2022  4:38 AM Total Bilirubin: 2.7 mg/dL at 0/98/1191  4:78 AM Serum Albumin: 2.3 g/dL at 2/95/6213  0:86 AM INR(ratio): 1.5 at 09/28/2022  4:38 AM Age at listing (hypothetical): 89 years Sex: Male at 09/28/2022  4:38 AM    Current Outpatient Medications  Medication Sig Dispense Refill   albuterol (VENTOLIN HFA) 108 (90 Base) MCG/ACT inhaler Inhale 2 puffs into the lungs every 6 (six) hours as needed for wheezing or shortness of breath. 20.1 g 0   busPIRone (BUSPAR) 5 MG tablet Take 1 tablet by mouth daily as needed.     carvedilol (COREG) 3.125 MG tablet Take 1 tablet (3.125 mg total) by mouth 2 (two) times daily with a meal. 60 tablet 2   Continuous Blood Gluc Sensor (DEXCOM G7 SENSOR) MISC Inject 1 application. into the skin as directed. Change sensor every 10 days as directed. 6 each 3   FLUoxetine (PROZAC) 10 MG capsule TAKE 4 CAPSULES BY MOUTH EVERY DAY (Patient taking differently: Take 10 mg by mouth See admin instructions. TAKE 2 CAPSULES IN THE MORNING AND 2 CAPSULES EVERY  EVENING.) 260 capsule 0   folic acid (FOLVITE) 1 MG tablet Take 1 tablet (1 mg total) by mouth daily. 30 tablet 0   furosemide (LASIX) 40 MG tablet Take 1 tablet (40 mg total) by mouth daily. 30 tablet 2   gabapentin (NEURONTIN) 100 MG capsule Take 1 tablet by mouth daily.     glucose blood test strip Use as instructed to monitor glucose 4 times daily 100 each 12   insulin glargine (LANTUS SOLOSTAR) 100 UNIT/ML Solostar Pen Inject 35 Units into the skin at bedtime. 30 mL 3   insulin lispro (HUMALOG KWIKPEN) 100 UNIT/ML KwikPen Inject 10-16 Units into the skin 3 (three) times daily. 40 mL 3   Insulin Pen Needle (PEN NEEDLES) 32G X 4 MM MISC 1 each by Does not apply route in the morning, at noon, in the evening, and at bedtime. Use to inject insulin 4 times daily 200 each 3   lactulose (CHRONULAC) 10 GM/15ML solution Take 15 mLs (10 g total) by mouth 2 (two) times daily. 900 mL 1   levocetirizine (XYZAL) 5 MG tablet Take 1 tablet by mouth daily.     Multiple Vitamin (MULTIVITAMIN) tablet Take 1 tablet by mouth daily.     nitrofurantoin, macrocrystal-monohydrate, (MACROBID) 100 MG capsule      ondansetron (ZOFRAN-ODT) 4  MG disintegrating tablet Place 1 tablet twice a day by translingual route for 7 days.     pantoprazole (PROTONIX) 40 MG tablet Take 1 tablet (40 mg total) by mouth 2 (two) times daily. 60 tablet 0   polyethylene glycol (MIRALAX / GLYCOLAX) 17 g packet Take 17 g by mouth daily as needed for mild constipation. 14 each 0   spironolactone (ALDACTONE) 50 MG tablet Take 3 tablets (150 mg total) by mouth daily. 90 tablet 3   sucralfate (CARAFATE) 1 GM/10ML suspension Take 10 mLs (1 g total) by mouth 4 (four) times daily -  with meals and at bedtime for 14 days. 560 mL 0   thiamine (VITAMIN B-1) 100 MG tablet Take 1 tablet (100 mg total) by mouth daily. 30 tablet 1   No current facility-administered medications for this visit.    Past Medical History:  Diagnosis Date   Anxiety     Arthritis    affects hands, shoulder, neck, knees, hips, ankles, toes   Chronic back pain    Diabetes mellitus    diet controlled   Eczema    Fatty liver, alcoholic    GERD (gastroesophageal reflux disease)    Glaucoma    slight case   Hypertension    Psoriasis    Sleep apnea    Substance abuse    Vertigo 09/11/2017   Wears glasses     Past Surgical History:  Procedure Laterality Date   COLONOSCOPY WITH PROPOFOL N/A 04/05/2022   Procedure: COLONOSCOPY WITH PROPOFOL;  Surgeon: Lanelle Bal, DO;  [facility]: [facility];  Service: Endoscopy;  Laterality: N/A;  10:45 AM,unable to reach pt to move up   ESOPHAGEAL BANDING N/A 08/26/2022   Procedure: ESOPHAGEAL BANDING;  Surgeon: Lanelle Bal, DO;  [facility]: [facility];  Service: Endoscopy;  Laterality: N/A;   ESOPHAGEAL BANDING  09/24/2022   Procedure: ESOPHAGEAL BANDING;  Surgeon: Corbin Ade, MD;  [facility]: [facility];  Service: Endoscopy;;   ESOPHAGOGASTRODUODENOSCOPY (EGD) WITH PROPOFOL N/A 08/26/2022   Procedure: ESOPHAGOGASTRODUODENOSCOPY (EGD) WITH PROPOFOL;  Surgeon: Lanelle Bal, DO;  [facility];  Service: Endoscopy;  Laterality: N/A;   ESOPHAGOGASTRODUODENOSCOPY (EGD) WITH PROPOFOL N/A 09/24/2022   Procedure: ESOPHAGOGASTRODUODENOSCOPY (EGD) WITH PROPOFOL;  Surgeon: Corbin Ade, MD;  [facility]: [facility];  Service: Endoscopy;  Laterality: N/A;  hematemesis, esophageal varices, melena   MULTIPLE TOOTH EXTRACTIONS     POLYPECTOMY  04/05/2022   Procedure: POLYPECTOMY;  Surgeon: Lanelle Bal, DO;  [facility]: [facility];  Service: Endoscopy;;   POSTERIOR CERVICAL LAMINECTOMY  04/21/2012   Procedure: POSTERIOR CERVICAL LAMINECTOMY;  Surgeon: Mariam Dollar, MD;  Location: [facility];  Service: Neurosurgery;  Laterality: Left;  Posterior Cervical laminectomy/foraminotomy and diskectomy, left cervical seven-thoracic one   ROTATOR CUFF REPAIR     THORACIC DISCECTOMY Left 04/03/2014    Procedure: Left Thoracic Seven to Eight, Thoracic Eight to Nine Thoracic Discectomy ;  Surgeon: Mariam Dollar, MD;  Location: [facility];  Service: Neurosurgery;  Laterality: Left;    Family History  Problem Relation Age of Onset   Diabetes Father    Cancer - Lung Father    Hypertension Other    Cancer - Lung Other     Allergies as of 10/14/2022 - Review Complete 10/14/2022  Allergen Reaction Noted   Neosporin + pain relief max st [neomy-bacit-polymyx-pramoxine] Rash 04/19/2012    Social History   Socioeconomic History   Marital status: Divorced  Spouse name: Not on file   Number of children: Not on file   Years of education: Not on file   Highest education level: Not on file  Occupational History    Comment: Truck to storage  Tobacco Use   Smoking status: Every Day    Packs/day: 0.25    Years: 40.00    Additional pack years: 0.00    Total pack years: 10.00    Types: Cigarettes    Last attempt to quit: 05/09/2022    Years since quitting: 0.4   Smokeless tobacco: Never   Tobacco comments:    pt trying to quit on his own  Vaping Use   Vaping Use: Former  Substance and Sexual Activity   Alcohol use: Not Currently    Alcohol/week: 12.0 standard drinks of alcohol    Types: 12 Cans of beer per week    Comment: Either Smirnoff Smash-4 per day or Fireball-10 airplane bottles in a day   Drug use: No   Sexual activity: Yes    Birth control/protection: None  Other Topics Concern   Not on file  Social History Narrative   Not on file   Social Determinants of Health   Financial Resource Strain: Medium Risk (10/07/2022)   Overall Financial Resource Strain (CARDIA)    Difficulty of Paying Living Expenses: Somewhat hard  Food Insecurity: No Food Insecurity (10/07/2022)   Hunger Vital Sign    Worried About Running Out of Food in the Last Year: Never true    Ran Out of Food in the Last Year: Never true  Transportation Needs: No Transportation Needs (10/07/2022)   PRAPARE -  Administrator, Civil Service (Medical): No    Lack of Transportation (Non-Medical): No  Physical Activity: Not on file  Stress: Stress Concern Present (10/07/2022)   [employer] of Occupational Health - Occupational Stress Questionnaire    Feeling of Stress : To some extent  Social Connections: Not on file     Review of Systems   Gen: + fatigue, chills, weakness. Denies fever, anorexia. Denies weight loss.  CV: Denies chest pain, palpitations, syncope, peripheral edema, and claudication. Resp: + shortness of breath. Denies cough, coughing up blood, and pleurisy. GI: See HPI Derm: Denies rash, itching, dry skin Psych: + confusion. Denies depression, anxiety, memory loss. No homicidal or suicidal ideation.  Heme: Denies bruising, bleeding, and enlarged lymph nodes.  Physical Exam   BP 119/78 (BP Location: Right Arm, Patient Position: Sitting, Cuff Size: Large)   Pulse 67   Temp 98.2 F (36.8 C) (Oral)   Ht 5\' 6"  (1.676 m)   Wt 194 lb 3.2 oz (88.1 kg)   SpO2 98%   BMI 31.34 kg/m   General:   Mild jaundice. Alert and oriented. No distress noted. Pleasant and cooperative.  Head:  Normocephalic and atraumatic. Eyes:  Conjuctiva clear without scleral icterus. Mouth:  Oral mucosa pink and moist. Good dentition. No lesions. Lungs:  Clear to auscultation bilaterally. No wheezes, rales, or rhonchi. No distress.  Heart:  S1, S2 present without murmurs appreciated.  Abdomen:  +BS, soft, non-tender, mildly distended. No rebound or guarding. Small reducible umbilical hernia. No HSM or masses noted. Rectal: deferred Msk:  Symmetrical without gross deformities. Normal posture. Extremities:  +1/+2 edema bilaterally.  Neurologic:  Alert and  oriented x4. No asterixis.  Psych:  Alert and cooperative. Normal mood and affect.   Assessment  Shawn Velazquez is a 57 y.o. male with a history of  alcoholic liver cirrhosis, ongoing alcohol dependence, sleep apnea, diabetes, HTN  presenting today for hospital follow-up.  Alcoholic Cirrhosis with ascites and variceal bleeding: Decompensated.  Multiple recent hospital admissions for anasarca and GI bleed.  Most recent admission 09/24/2022 - 09/28/2022.  He was treated with Rocephin for SBP prophylaxis and underwent EGD which revealed recently bleeding grade 2/3 esophageal varices and portal hypertensive gastropathy.  Prior to this hospitalization he had continued to drink alcohol.  Bilirubin peaked at 4.1.  He had evidence of alcoholic hepatitis but given leukocytosis and compliance issues he was not retreated with prednisolone.  He was treated briefly with prednisolone in January.  He follows regularly with hematology for leukocytosis and thrombocytopenia (platelets as low as 19 while inpatient).  Given anasarca he was treated with IV albumin and was advised to increase his Aldactone to 150 mg daily in light of hypokalemia and to continue Lasix 40 mg once daily.  He has continued to follow a low-sodium diet on discharge.  Advised him to monitor weights regularly and notify for any significant increases or any development of increased shortness of breath.  Hemoglobin stable at 8.7 on discharge.    He continues to exhibit confusion although no asterixis on exam today.  He denies any alcohol use since discharge and has been taking lactulose 10 g twice daily as prescribed and having 3-4 good bowel movements daily.  He also has significant bloating related to his lactulose.  Advise Gas-X as needed.  Given his history of hypokalemia related to bowel movements and his mild intolerance to lactulose and ongoing hepatic encephalopathy we will add Xifaxan to his regimen.  He will continue carvedilol 3.125 mg for variceal prophylaxis he is scheduled to undergo repeat EGD 4/29 with Dr. Marletta Lor.  Given his recent increase in spironolactone we will repeat electrolytes and reassess MELD score.  He is due for repeat liver imaging in July 2024, we will  schedule this at his follow-up in 3 months.  GERD: Controlled on pantoprazole 40 mg BID.  Currently completing 2 week course of carafate.   PLAN   Proceed with EGD scheduled 11/01/2022 with Dr. Marletta Lor.  Continue Lasix 40 mg daily Spironolactone 150 mg daily. CMP and INR in 2 weeks 2 g sodium diet Continue carvedilol 3.125 mg twice daily.  Continue lactulose 10 g once to twice daily, titrate for 3-4 bowel movements daily. Start Xifaxin 550 mg BID Gas Ex as needed.  Continue pantoprazole 40 mg BID.  Monitor weight regularly at home Absolute alcohol cessation Follow-up liver imaging in July 2024 Continue to follow with hematology.  Colonoscopy 2026 Daily allergy medication and Flonase given mild wheezing on exam today.  Advised to follow-up with PCP.  If worsening shortness of breath will obtain a chest x-ray Follow-up in 3 months    Brooke Bonito, MSN, FNP-BC, AGACNP-BC [facility]

## 2022-10-14 ENCOUNTER — Inpatient Hospital Stay: Payer: BC Managed Care – PPO | Attending: Hematology

## 2022-10-14 ENCOUNTER — Encounter: Payer: Self-pay | Admitting: Gastroenterology

## 2022-10-14 ENCOUNTER — Ambulatory Visit: Payer: BC Managed Care – PPO | Admitting: Gastroenterology

## 2022-10-14 VITALS — BP 119/78 | HR 67 | Temp 98.2°F | Ht 66.0 in | Wt 194.2 lb

## 2022-10-14 DIAGNOSIS — K219 Gastro-esophageal reflux disease without esophagitis: Secondary | ICD-10-CM | POA: Diagnosis not present

## 2022-10-14 DIAGNOSIS — D696 Thrombocytopenia, unspecified: Secondary | ICD-10-CM | POA: Insufficient documentation

## 2022-10-14 DIAGNOSIS — I8511 Secondary esophageal varices with bleeding: Secondary | ICD-10-CM

## 2022-10-14 DIAGNOSIS — D649 Anemia, unspecified: Secondary | ICD-10-CM

## 2022-10-14 DIAGNOSIS — K7031 Alcoholic cirrhosis of liver with ascites: Secondary | ICD-10-CM | POA: Diagnosis not present

## 2022-10-14 DIAGNOSIS — D539 Nutritional anemia, unspecified: Secondary | ICD-10-CM

## 2022-10-14 LAB — CBC
HCT: 26.8 % — ABNORMAL LOW (ref 39.0–52.0)
Hemoglobin: 9.2 g/dL — ABNORMAL LOW (ref 13.0–17.0)
MCH: 35.1 pg — ABNORMAL HIGH (ref 26.0–34.0)
MCHC: 34.3 g/dL (ref 30.0–36.0)
MCV: 102.3 fL — ABNORMAL HIGH (ref 80.0–100.0)
Platelets: 225 10*3/uL (ref 150–400)
RBC: 2.62 MIL/uL — ABNORMAL LOW (ref 4.22–5.81)
RDW: 14.7 % (ref 11.5–15.5)
WBC: 9.8 10*3/uL (ref 4.0–10.5)
nRBC: 0 % (ref 0.0–0.2)

## 2022-10-14 LAB — SAMPLE TO BLOOD BANK

## 2022-10-14 MED ORDER — RIFAXIMIN 550 MG PO TABS: 550.0000 mg | ORAL_TABLET | Freq: Two times a day (BID) | ORAL | 2 refills | Status: DC

## 2022-10-14 NOTE — Patient Instructions (Signed)
Cirrhosis Lifestyle Recommendations:  High-protein diet from a primarily plant-based diet. Avoid red meat.  No raw or undercooked meat, seafood, or shellfish. Low-fat/cholesterol/carbohydrate diet. Limit sodium to no more than 2000 mg/day including everything that you eat and drink. Recommend at least 30 minutes of aerobic and resistance exercise 3 days/week. Limit Tylenol to 2000 mg daily.  Absolute alcohol cessation  Continue Lasix 40 mg daily, spironolactone 150 mg daily, carvedilol 3.125 mg twice daily.  Continue lactulose 10 g twice daily to achieve 3-4 bowel movements daily.  For gassiness you may use Gas-X over-the-counter as needed.  Given ongoing confusion despite lactulose with good bowel movements we will start you on Xifaxan.  You will take 1 tablet twice daily.  You will need to have some labs completed in 2 weeks to reassess your electrolytes and kidney function.  Please have these performed when you have your labs for hematology performed.  Monitor your weight regularly at home and keep a record.  If you have more than a 5-10 pound weight gain in 1 week, you experience increased abdominal distention, or you develop any worsening shortness of breath please contact the office.  For now given your allergies and wheezing I would recommend that you continue to use your inhaler as needed, contact your PCP, and start taking Flonase and an over-the-counter daily allergy supplement such as nondrowsy Allegra.  If no improvement in shortness of breath we can obtain a chest x-ray to assess for fluid.  It was a pleasure to see you today. I want to create trusting relationships with patients. If you receive a survey regarding your visit,  I greatly appreciate you taking time to fill this out on paper or through your MyChart. I value your feedback.  Brooke Bonito, MSN, FNP-BC, AGACNP-BC [facility]

## 2022-10-15 ENCOUNTER — Ambulatory Visit: Payer: Self-pay | Admitting: *Deleted

## 2022-10-15 NOTE — Patient Outreach (Signed)
  Care Coordination   Follow Up Visit Note   10/15/2022 Name: Shawn Velazquez MRN: [id number] DOB: 1966-01-14  Shawn Velazquez is a 57 y.o. year old male who sees Margo Aye, Kathleene Hazel, MD for primary care. I spoke with  Delsa Bern by phone today.  What matters to the patients health and wellness today?  stomach pain with nausea Needs to pick up he zofran Awaiting a ride to pharmacy Still awaiting Dexcom arrival- on back order Cbg 123 this morning with his standard glucometer while speaking with RN CM  Family medical leave act (FMLA) Confirms he spoke with someone on 10/14/22 about his short term disability services He feels comfortable with continuing to work with this person at this time    Goals Addressed             This Visit's Progress    [facility] care coordination services       Interventions Today    Flowsheet Row Most Recent Value  Chronic Disease   Chronic disease during today's visit Diabetes, Other  [abdominal pain, pending dexcom (on back order) Short term disability]  General Interventions   General Interventions Discussed/Reviewed General Interventions Reviewed, Psychiatrist Interventions   Education Provided Provided Education  [Discussed the access to [employer] ([facility] site) customer services [phone]/ filing a claim [phone]]  Provided Verbal Education On Other, Blood Sugar Monitoring, Sick Day Rules  Nutrition Interventions   Nutrition Discussed/Reviewed Nutrition Reviewed, Carbohydrate meal planning  Pharmacy Interventions   Pharmacy Dicussed/Reviewed Pharmacy Topics Reviewed, Medications and their functions              SDOH assessments and interventions completed:  No     Care Coordination Interventions:  Yes, provided   Follow up plan: Follow up call scheduled for 11/12/22    Encounter Outcome:  Pt. Visit Completed   Tylik Treese L. Noelle Penner, RN, BSN, CCM [facility] Care Management Community Coordinator Office number  [phone]

## 2022-10-15 NOTE — Patient Instructions (Addendum)
Visit Information  Thank you for taking time to visit with me today. Please don't hesitate to contact me if I can be of assistance to you.   Following are the goals we discussed today:   Goals Addressed             This Visit's Progress    [facility] care coordination services   On track    Interventions Today    Flowsheet Row Most Recent Value  Chronic Disease   Chronic disease during today's visit Diabetes, Other  [abdominal pain, pending dexcom (on back order) Short term disability]  General Interventions   General Interventions Discussed/Reviewed General Interventions Reviewed, Psychiatrist Interventions   Education Provided Provided [employer], Provided Education  [employer] + Discussed the access to [employer] FMLA site) customer services [phone]/ filing a claim [phone]]  Provided Verbal Education On Other, Blood Sugar Monitoring, Sick Day Rules  Nutrition Interventions   Nutrition Discussed/Reviewed Nutrition Reviewed, Carbohydrate meal planning  Pharmacy Interventions   Pharmacy Dicussed/Reviewed Pharmacy Topics Reviewed, Medications and their functions              Our next appointment is by telephone on 11/12/22 at 1000  Please call the care guide team at [phone] if you need to cancel or reschedule your appointment.   If you are experiencing a Mental Health or Behavioral Health Crisis or need someone to talk to, please call the Suicide and Crisis Lifeline: 988 call the [place] National Suicide Prevention Lifeline: 234-439-2371 or TTY: 304-761-3813 TTY (320)812-8636) to talk to a trained counselor call 1-800-273-TALK (toll free, 24 hour hotline) call the [facility]: [phone] call 911   Patient verbalizes understanding of instructions and care plan provided today and agrees to view in MyChart. Active MyChart status and patient understanding of how to access instructions and care plan via  MyChart confirmed with patient.     The patient has been provided with contact information for the care management team and has been advised to call with any health related questions or concerns.   Ercia Crisafulli L. Noelle Penner, RN, BSN, CCM [facility] Care Management Community Coordinator Office number [phone]

## 2022-10-19 ENCOUNTER — Telehealth: Payer: Self-pay | Admitting: *Deleted

## 2022-10-19 NOTE — Telephone Encounter (Signed)
LMOVM to call back to see about his insurance

## 2022-10-19 NOTE — Telephone Encounter (Signed)
Received fax from [facility] patient insurance terminated 10/14/22 and he can't pay for service.

## 2022-10-21 NOTE — Telephone Encounter (Signed)
LMOVM to call back 

## 2022-10-21 NOTE — Telephone Encounter (Signed)
Pt called and he says his insurance was terminated, no other source of insurance. He says he has someone working on it and hopes to get it back shortly. He doesn't want to go to [facility]. He will call back when he gets insurance reinstated and wants to be referred back to [facility].  FYI

## 2022-10-26 NOTE — Patient Instructions (Signed)
20    Your procedure is scheduled on: 11/01/2022  Report to [facility] Main Entrance at    6:00 AM.  Call this number if you have problems the morning of surgery: [phone]   Remember:   Follow instructions on letter from office regarding when to stop eating and drinking        No Smoking the day of procedure      Take these medicines the morning of surgery with A SIP OF WATER: Buspar, Carvedilol, prozac, gabapentin, and pantoprazole  No diabetic medication am of procedure  Take only 1/2 dose of Lantus 17 units the night before procedure   Do not wear jewelry, make-up or nail polish.  Do not wear lotions, powders, or perfumes. You may wear deodorant.                Do not bring valuables to the hospital.  Contacts, dentures or bridgework may not be worn into surgery.  Leave suitcase in the car. After surgery it may be brought to your room.  For patients admitted to the hospital, checkout time is 11:00 AM the day of discharge.   Patients discharged the day of surgery will not be allowed to drive home. Upper Endoscopy, Adult Upper endoscopy is a procedure to look inside the upper GI (gastrointestinal) tract. The upper GI tract is made up of: The part of the body that moves food from your mouth to your stomach (esophagus). The stomach. The first part of your small intestine (duodenum). This procedure is also called esophagogastroduodenoscopy (EGD) or gastroscopy. In this procedure, your health care provider passes a thin, flexible tube (endoscope) through your mouth and down your esophagus into your stomach. A small camera is attached to the end of the tube. Images from the camera appear on a monitor in the exam room. During this procedure, your health care provider may also remove a small piece of tissue to be sent to a lab and examined under a microscope (biopsy). Your health care provider may do an upper endoscopy to diagnose cancers of the upper GI tract. You may also have this  procedure to find the cause of other conditions, such as: Stomach pain. Heartburn. Pain or problems when swallowing. Nausea and vomiting. Stomach bleeding. Stomach ulcers. Tell a health care provider about: Any allergies you have. All medicines you are taking, including vitamins, herbs, eye drops, creams, and over-the-counter medicines. Any problems you or family members have had with anesthetic medicines. Any blood disorders you have. Any surgeries you have had. Any medical conditions you have. Whether you are pregnant or may be pregnant. What are the risks? Generally, this is a safe procedure. However, problems may occur, including: Infection. Bleeding. Allergic reactions to medicines. A tear or hole (perforation) in the esophagus, stomach, or duodenum. What happens before the procedure? Staying hydrated Follow instructions from your health care provider about hydration, which may include: Up to 4 hours before the procedure - you may continue to drink clear liquids, such as water, clear fruit juice, black coffee, and plain tea.   Medicines Ask your health care provider about: Changing or stopping your regular medicines. This is especially important if you are taking diabetes medicines or blood thinners. Taking medicines such as aspirin and ibuprofen. These medicines can thin your blood. Do not take these medicines unless your health care provider tells you to take them. Taking over-the-counter medicines, vitamins, herbs, and supplements. General instructions Plan to have someone take you home from the hospital  or clinic. If you will be going home right after the procedure, plan to have someone with you for 24 hours. Ask your health care provider what steps will be taken to help prevent infection. What happens during the procedure?  An IV will be inserted into one of your veins. You may be given one or more of the following: A medicine to help you relax (sedative). A  medicine to numb the throat (local anesthetic). You will lie on your left side on an exam table. Your health care provider will pass the endoscope through your mouth and down your esophagus. Your health care provider will use the scope to check the inside of your esophagus, stomach, and duodenum. Biopsies may be taken. The endoscope will be removed. The procedure may vary among health care providers and hospitals. What happens after the procedure? Your blood pressure, heart rate, breathing rate, and blood oxygen level will be monitored until you leave the hospital or clinic. Do not drive for 24 hours if you were given a sedative during your procedure. When your throat is no longer numb, you may be given some fluids to drink. It is up to you to get the results of your procedure. Ask your health care provider, or the department that is doing the procedure, when your results will be ready. Summary Upper endoscopy is a procedure to look inside the upper GI tract. During the procedure, an IV will be inserted into one of your veins. You may be given a medicine to help you relax. A medicine will be used to numb your throat. The endoscope will be passed through your mouth and down your esophagus. This information is not intended to replace advice given to you by your health care provider. Make sure you discuss any questions you have with your health care provider. Document Revised: 12/14/2017 Document Reviewed: 11/21/2017 Elsevier Patient Education  2020 Elsevier Inc.                                                                                                                                      EndoscopyCare After  Please read the instructions outlined below and refer to this sheet in the next few weeks. These discharge instructions provide you with general information on caring for yourself after you leave the hospital. Your doctor may also give you specific instructions. While your treatment  has been planned according to the most current medical practices available, unavoidable complications occasionally occur. If you have any problems or questions after discharge, please call your doctor. HOME CARE INSTRUCTIONS Activity You may resume your regular activity but move at a slower pace for the next 24 hours.  Take frequent rest periods for the next 24 hours.  Walking will help expel (get rid of) the air and reduce the bloated feeling in your abdomen.  No driving for 24 hours (because of the anesthesia (medicine) used during the test).  You may shower.  Do not sign any important legal documents or operate any machinery for 24 hours (because of the anesthesia used during the test).  Nutrition Drink plenty of fluids.  You may resume your normal diet.  Begin with a light meal and progress to your normal diet.  Avoid alcoholic beverages for 24 hours or as instructed by your caregiver.  Medications You may resume your normal medications unless your caregiver tells you otherwise. What you can expect today You may experience abdominal discomfort such as a feeling of fullness or "gas" pains.  You may experience a sore throat for 2 to 3 days. This is normal. Gargling with salt water may help this.  Follow-up Your doctor will discuss the results of your test with you. SEEK IMMEDIATE MEDICAL CARE IF: You have excessive nausea (feeling sick to your stomach) and/or vomiting.  You have severe abdominal pain and distention (swelling).  You have trouble swallowing.  You have a temperature over 100 F (37.8 C).  You have rectal bleeding or vomiting of blood.  Document Released: 02/03/2004 Document Revised: 06/10/2011 Document Reviewed: 08/16/2007

## 2022-10-27 ENCOUNTER — Encounter (HOSPITAL_COMMUNITY): Payer: Self-pay

## 2022-10-27 ENCOUNTER — Encounter (HOSPITAL_COMMUNITY)
Admission: RE | Admit: 2022-10-27 | Discharge: 2022-10-27 | Disposition: A | Discharge status: Home / Self Care | Payer: BC Managed Care – PPO | Source: Ambulatory Visit | Attending: Internal Medicine | Admitting: Internal Medicine

## 2022-10-27 ENCOUNTER — Telehealth: Payer: Self-pay | Admitting: *Deleted

## 2022-10-27 DIAGNOSIS — K7031 Alcoholic cirrhosis of liver with ascites: Secondary | ICD-10-CM

## 2022-10-27 NOTE — Telephone Encounter (Signed)
Pt reports to cancel procedure for now. 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

## 2022-10-27 NOTE — Telephone Encounter (Signed)
-----   Message from Lillia Mountain, RN sent at 10/27/2022 11:17 AM EDT ----- Regarding: no show for PAT   Recent note stayed no insurance Hey Pammy Vesey,  I'm not sure what is going on with all the no Shows this week.  Maybe the full moon.  I see a note from Tammy about no insurance coverage  Thanks,  Cliffton Asters RN

## 2022-10-28 ENCOUNTER — Inpatient Hospital Stay: Payer: BC Managed Care – PPO

## 2022-10-30 ENCOUNTER — Other Ambulatory Visit (INDEPENDENT_AMBULATORY_CARE_PROVIDER_SITE_OTHER): Payer: Self-pay | Admitting: Internal Medicine

## 2022-11-01 ENCOUNTER — Encounter (HOSPITAL_COMMUNITY): Admission: RE | Payer: Self-pay | Source: Home / Self Care

## 2022-11-01 ENCOUNTER — Ambulatory Visit (HOSPITAL_COMMUNITY): Admission: RE | Admit: 2022-11-01 | Payer: BC Managed Care – PPO | Source: Home / Self Care

## 2022-11-01 SURGERY — ESOPHAGOGASTRODUODENOSCOPY (EGD) WITH PROPOFOL
Anesthesia: Monitor Anesthesia Care

## 2022-11-03 NOTE — Progress Notes (Unsigned)
GI Office Note    Referring Provider: [name], MD Primary Care Physician:  [name], MD Primary Gastroenterologist: Hennie Duos. Marletta Lor, DO  Date:  11/04/2022  ID:  Shawn Velazquez, DOB [birth year], MRN [id number]   Chief Complaint   Chief Complaint  Patient presents with   Emesis   History of Present Illness  Shawn Velazquez is a 57 y.o. male with a history of alcoholic liver cirrhosis, alcohol abuse, sleep apnea, diabetes, HTN presenting today with complaint of vomiting and fear of variceal bands coming off.  MRI liver with and without contrast 08/03/2022: -Chronic liver disease -Macronodular liver with multiple presumed regenerative nodules -No clear aggressive appearing lesions with restricted diffusion -Mild ascites -Varices on the stomach and lower esophagus -Mild splenic enlargement   Hospitalized at [facility] 08/26/2022 through 08/29/2022: Presented with hematemesis and melena for 3 days.  Noted to have previous admission at the end of January for anasarca.  He underwent EGD for hematemesis and melena during this hospitalization in February.  Procedure outlined below.  He was treated with octreotide and PPI infusion as well as IV Rocephin for SBP prophylaxis.  He was also treated with Carafate 1 g 4 times daily.  His MELD was 27 on admission with a DF of 45.9.  Was treated with prednisolone during his prior hospitalization in January.  Patient has reported intermittent drowsiness and confusion concerning for possible hepatic encephalopathy but has been maintained on lactulose.  Given significant bowel movements while inpatient his lactulose was decreased from 3 times daily to twice daily.  Given he was still having alcohol use prednisolone was not started.  Prior to discharge he was advised to continue Lasix 20 mg daily 100 mg spironolactone daily.  His MELD 3.0 was 22 on discharge.  Per Dr. Levon Hedger, no need for Xifaxan at this time.  Alcohol cessation encouraged.  Recommendations on discharge were to continue Carafate for 2 weeks, PPI twice daily, Lasix 40 mg daily, spironolactone 100 mg daily, lactulose 2-3 bowel movements daily, carvedilol 3.125 mg twice daily.  Need to arrange outpatient evaluation at transplant center given elevated levels.   EGD 08/26/2022: -grade 2 esophageal varices s/p banding x 3 with complete eradication, without bleeding -Single nonbleeding angiodysplastic lesion in the duodenum.  -Advised Carafate suspension 1 g before meals and nightly -Repeat EGD in 4-8 weeks.   Recent hospitalization 09/24/22-09/28/22.  He presented to the ED with hematemesis, having multiple episodes.  Reportedly continued to drink alcohol.  Reports some mild tremors and intermittent confusion but no worse than previous.  Complaining of lower extremity edema and abdominal distention.  History of Rocephin for SBP prophylaxis.  Underwent EGD with Dr. Jena Gauss.  IV twice daily and octreotide infusion.  Continued on lactulose as well as daily diuretic regimen of 40 mg of Lasix and 100 mg of spironolactone.  EGD as outlined below.  Was given vitamin K x 1 dose. Discharged on lasix 40 mg daily and spironolactone 150mg  daily (due to hypokalemia).  Recommended restarting Coreg 3.125 mg twice daily.   EGD 09/24/22: -4 columns of grade 2-3 esophageal varices, cherry red spots 1 on each of 2 columns without active bleeding -Portal hypertensive gastropathy with touch friability, no gastric varices -Microvasive 7 shot Bander patch -1 band placed on each column with good hemostasis -Advise repeat banding in 4 weeks. -Continue PPI   Last office visit 10/14/22. Reported recent hematemesis during hospitalization.  Mild vomiting after drinking milk but not occurring daily.  Having  mild abdominal distention and intermittent chills without fevers.  Having confusion daily but denies any hallucinations.  Aching lactulose twice daily and having 3-4 bowel movements daily.  Taking diuretics  as prescribed.  States that he is adhering to a low-sodium diet.  Denied any alcohol since discharge from hospital 09/28/2022.  Advised to continue current medications including Lasix 40 mg daily, spironolactone 150 mg daily, carvedilol 3.125 twice daily, lactulose, and PPI twice daily.  Start on Xifaxan 550 mg twice daily.  Advised he may use Gas-X as needed for bloating.  Due for liver imaging in July 2024.  Advised to proceed with EGD that was already scheduled.  EGD cancelled given patient lost insurance 10/14/22.    Today: Has had increased nausea with vomiting. No hematemesis.   Swelling is on and off but fairly well controlled. Abdomen is sore. Abdomen is sort of tight at times.   Eating very little. Has little appetite. Unsure if tacking xifaxin. Still having confusion. Having 1-2 BM daily. Tacking lactulose twice daily.   Insurance reinstated last week.   Wt Readings from Last 3 Encounters:  11/04/22 182 lb 6.4 oz (82.7 kg)  10/14/22 194 lb 3.2 oz (88.1 kg)  09/26/22 204 lb 2.3 oz (92.6 kg)    Current Outpatient Medications  Medication Sig Dispense Refill   albuterol (VENTOLIN HFA) 108 (90 Base) MCG/ACT inhaler Inhale 2 puffs into the lungs every 6 (six) hours as needed for wheezing or shortness of breath. 20.1 g 0   busPIRone (BUSPAR) 5 MG tablet Take 1 tablet by mouth daily as needed.     carvedilol (COREG) 3.125 MG tablet Take 1 tablet (3.125 mg total) by mouth 2 (two) times daily with a meal. 60 tablet 2   Continuous Blood Gluc Sensor (DEXCOM G7 SENSOR) MISC Inject 1 application. into the skin as directed. Change sensor every 10 days as directed. 6 each 3   FLUoxetine (PROZAC) 10 MG capsule TAKE 4 CAPSULES BY MOUTH EVERY DAY (Patient taking differently: Take 10 mg by mouth See admin instructions. TAKE 2 CAPSULES IN THE MORNING AND 2 CAPSULES EVERY EVENING.) 260 capsule 0   folic acid (FOLVITE) 1 MG tablet TAKE 1 TABLET BY MOUTH DAILY 30 tablet 0   furosemide (LASIX) 40 MG  tablet Take 1 tablet (40 mg total) by mouth daily. 30 tablet 2   gabapentin (NEURONTIN) 100 MG capsule Take 1 tablet by mouth daily.     glucose blood test strip Use as instructed to monitor glucose 4 times daily 100 each 12   insulin glargine (LANTUS SOLOSTAR) 100 UNIT/ML Solostar Pen Inject 35 Units into the skin at bedtime. 30 mL 3   insulin lispro (HUMALOG KWIKPEN) 100 UNIT/ML KwikPen Inject 10-16 Units into the skin 3 (three) times daily. 40 mL 3   Insulin Pen Needle (PEN NEEDLES) 32G X 4 MM MISC 1 each by Does not apply route in the morning, at noon, in the evening, and at bedtime. Use to inject insulin 4 times daily 200 each 3   lactulose (CHRONULAC) 10 GM/15ML solution Take 15 mLs (10 g total) by mouth 2 (two) times daily. 900 mL 1   levocetirizine (XYZAL) 5 MG tablet Take 1 tablet by mouth daily.     Multiple Vitamin (MULTIVITAMIN) tablet Take 1 tablet by mouth daily.     nitrofurantoin, macrocrystal-monohydrate, (MACROBID) 100 MG capsule      ondansetron (ZOFRAN-ODT) 4 MG disintegrating tablet Place 1 tablet twice a day by translingual route for 7 days.  pantoprazole (PROTONIX) 40 MG tablet Take 1 tablet (40 mg total) by mouth 2 (two) times daily. 60 tablet 0   polyethylene glycol (MIRALAX / GLYCOLAX) 17 g packet Take 17 g by mouth daily as needed for mild constipation. 14 each 0   rifaximin (XIFAXAN) 550 MG TABS tablet Take 1 tablet (550 mg total) by mouth 2 (two) times daily. 60 tablet 2   spironolactone (ALDACTONE) 50 MG tablet Take 3 tablets (150 mg total) by mouth daily. 90 tablet 3   sucralfate (CARAFATE) 1 GM/10ML suspension Take 10 mLs (1 g total) by mouth 4 (four) times daily -  with meals and at bedtime for 14 days. 560 mL 0   thiamine (VITAMIN B-1) 100 MG tablet Take 1 tablet (100 mg total) by mouth daily. 30 tablet 1   No current facility-administered medications for this visit.    Past Medical History:  Diagnosis Date   Anxiety    Arthritis    affects hands,  shoulder, neck, knees, hips, ankles, toes   Chronic back pain    Diabetes mellitus    diet controlled   Eczema    Fatty liver, alcoholic    GERD (gastroesophageal reflux disease)    Glaucoma    slight case   Hypertension    Psoriasis    Sleep apnea    Substance abuse (HCC)    Vertigo 09/11/2017   Wears glasses     Past Surgical History:  Procedure Laterality Date   COLONOSCOPY WITH PROPOFOL N/A 04/05/2022   Procedure: COLONOSCOPY WITH PROPOFOL;  Surgeon: Lanelle Bal, DO;  [facility]: [facility];  Service: Endoscopy;  Laterality: N/A;  10:45 AM,unable to reach pt to move up   ESOPHAGEAL BANDING N/A 08/26/2022   Procedure: ESOPHAGEAL BANDING;  Surgeon: Lanelle Bal, DO;  [facility]: [facility];  Service: Endoscopy;  Laterality: N/A;   ESOPHAGEAL BANDING  09/24/2022   Procedure: ESOPHAGEAL BANDING;  Surgeon: Corbin Ade, MD;  [facility]: [facility];  Service: Endoscopy;;   ESOPHAGOGASTRODUODENOSCOPY (EGD) WITH PROPOFOL N/A 08/26/2022   Procedure: ESOPHAGOGASTRODUODENOSCOPY (EGD) WITH PROPOFOL;  Surgeon: Lanelle Bal, DO;  [facility];  Service: Endoscopy;  Laterality: N/A;   ESOPHAGOGASTRODUODENOSCOPY (EGD) WITH PROPOFOL N/A 09/24/2022   Procedure: ESOPHAGOGASTRODUODENOSCOPY (EGD) WITH PROPOFOL;  Surgeon: Corbin Ade, MD;  [facility]: [facility];  Service: Endoscopy;  Laterality: N/A;  hematemesis, esophageal varices, melena   MULTIPLE TOOTH EXTRACTIONS     POLYPECTOMY  04/05/2022   Procedure: POLYPECTOMY;  Surgeon: Lanelle Bal, DO;  [facility]: [facility];  Service: Endoscopy;;   POSTERIOR CERVICAL LAMINECTOMY  04/21/2012   Procedure: POSTERIOR CERVICAL LAMINECTOMY;  Surgeon: Mariam Dollar, MD;  Location: [facility];  Service: Neurosurgery;  Laterality: Left;  Posterior Cervical laminectomy/foraminotomy and diskectomy, left cervical seven-thoracic one   ROTATOR CUFF REPAIR     THORACIC DISCECTOMY Left 04/03/2014   Procedure: Left Thoracic Seven  to Eight, Thoracic Eight to Nine Thoracic Discectomy ;  Surgeon: Mariam Dollar, MD;  Location: [facility];  Service: Neurosurgery;  Laterality: Left;    Family History  Problem Relation Age of Onset   Diabetes Father    Cancer - Lung Father    Hypertension Other    Cancer - Lung Other     Allergies as of 11/04/2022 - Review Complete 11/04/2022  Allergen Reaction Noted   Neosporin + pain relief max st [neomy-bacit-polymyx-pramoxine] Rash 04/19/2012    Social History   Socioeconomic History  Marital status: Divorced    Spouse name: Not on file   Number of children: Not on file   Years of education: Not on file   Highest education level: Not on file  Occupational History    Comment: Truck to storage  Tobacco Use   Smoking status: Every Day    Packs/day: 0.25    Years: 40.00    Additional pack years: 0.00    Total pack years: 10.00    Types: Cigarettes    Last attempt to quit: 05/09/2022    Years since quitting: 0.4   Smokeless tobacco: Never   Tobacco comments:    pt trying to quit on his own  Vaping Use   Vaping Use: Former  Substance and Sexual Activity   Alcohol use: Not Currently    Alcohol/week: 12.0 standard drinks of alcohol    Types: 12 Cans of beer per week    Comment: Either Smirnoff Smash-4 per day or Fireball-10 airplane bottles in a day   Drug use: No   Sexual activity: Yes    Birth control/protection: None  Other Topics Concern   Not on file  Social History Narrative   Not on file   Social Determinants of Health   Financial Resource Strain: Medium Risk (10/07/2022)   Overall Financial Resource Strain (CARDIA)    Difficulty of Paying Living Expenses: Somewhat hard  Food Insecurity: No Food Insecurity (10/07/2022)   Hunger Vital Sign    Worried About Running Out of Food in the Last Year: Never true    Ran Out of Food in the Last Year: Never true  Transportation Needs: No Transportation Needs (10/07/2022)   PRAPARE - Scientist, research (physical sciences) (Medical): No    Lack of Transportation (Non-Medical): No  Physical Activity: Not on file  Stress: Stress Concern Present (10/07/2022)   [employer] of Occupational Health - Occupational Stress Questionnaire    Feeling of Stress : To some extent  Social Connections: Not on file     Review of Systems   Gen: Denies fever, chills, anorexia. Denies fatigue, weakness, weight loss.  CV: Denies chest pain, palpitations, syncope, peripheral edema, and claudication. Resp: Denies dyspnea at rest, cough, wheezing, coughing up blood, and pleurisy. GI: See HPI Derm: Denies rash, itching, dry skin Psych: Denies depression, anxiety, memory loss, confusion. No homicidal or suicidal ideation.  Heme: Denies bruising, bleeding, and enlarged lymph nodes.   Physical Exam   BP 122/79 (BP Location: Right Arm, Patient Position: Sitting, Cuff Size: Normal)   Pulse 77   Temp 98.4 F (36.9 C) (Oral)   Ht 5\' 6"  (1.676 m)   Wt 182 lb 6.4 oz (82.7 kg)   SpO2 98%   BMI 29.44 kg/m   General:   Alert and oriented. No distress noted. Pleasant and cooperative.  Head:  Normocephalic and atraumatic. Eyes:  Conjuctiva clear without scleral icterus. Mouth:  Oral mucosa pink and moist. Good dentition. No lesions. Lungs: Expiratory wheezing bilaterally, L>R Heart:  S1, S2 present without murmurs appreciated.  Abdomen:  +BS, soft, non-distended.  Mild TTP to epigastrium.  Mild hepatomegaly noted toward epigastrium.  No rebound or guarding.  Mild ventral and umbilical hernia present, soft and nontender. Rectal: deferred Msk:  Symmetrical without gross deformities. Normal posture. Extremities:  Without edema. Neurologic:  Alert and  oriented x4 Psych:  Alert and cooperative. Normal mood and affect.   Assessment  Shawn Velazquez is a 57 y.o. male with a history of  alcoholic liver cirrhosis, alcohol abuse, sleep apnea, diabetes, HTN presenting today to discuss vomiting and concern for bands  coming off.   Decompensated Alcohol Cirrhosis: Course complicated by ascites requiring paracentesis with the last one being in January 2024, and esophageal variceal bleeding while inpatient EGDs requiring banding.  Recently was scheduled for outpatient EGD for reassessment of varices and additional banding as required however lost insurance so he had to cancel procedure.  Insurance recently reinstated therefore we will plan to schedule in the very near future.  He is currently on carvedilol 3.125 mg twice daily for variceal prophylaxis.  Taking lactulose 10 g twice daily 1-2 bowel movements daily.  At his last office visit Xifaxan was prescribed however unclear if he is taking at this time.  Instructed them to check his medication list at home and if he is not taking that he needs to pick up the prescription that is at the pharmacy.  Mild nonpitting edema bilaterally and no evidence of significant ascites on exam today.  He is maintained on Lasix 40 mg daily and spironolactone 150 mg daily.  He is in need of rechecking his electrolytes and kidney function and need to update MELD score.  He continues to abstain from alcohol since discharge.  He will continue on folic acid and thiamine supplementation.  He has experienced weight loss since his last visit.  Currently not eating very well due to lack of appetite, partially secondary to nausea and vomiting.  Encouraged frequent small meals throughout the day with focus on consumption of protein.  Encouraged 1-2 protein shakes daily.  Anemia: Following with hematology.  Has an appoint with them next week.  No further recurrences of melena or hematochezia since discharge.  Continues to feel fatigued however this is likely more secondary to his advanced liver disease.   GERD, vomiting: Denies any heartburn/indigestion symptoms however continues to have nausea and vomiting.  Nausea and vomiting worsened over the last couple of days.  Is using Zofran sparingly.  Does  report he needs refill on pantoprazole and Zofran.  Will refill both of these today.  Encouraged bland diet and slowly increase as tolerated.  PLAN   CMP and INR Proceed with upper endoscopy with propofol by Dr. Marletta Lor in near future: the risks, benefits, and alternatives have been discussed with the patient in detail. The patient states understanding and desires to proceed. ASA 3 Zofran TID as needed. Refilled today.  Continue Lasix 40 mg daily and spironolactone 150 mg daily Start Xifaxin 550 mg twice daily. (Unsure if he has gotten it).  Continue lactulose 10 g once twice daily, titrate for 3-4 bowel movements daily Continue carvedilol 3.125 mg twice daily Continue pantoprazole 40 mg twice daily. Refilled today Complete alcohol cessation Continue to follow with hematology 2 g sodium diet Follow up with [facility] Start drinking 1-2 Ensure/boost daily. 4-6 small meals daily. Continue to folic acid and thiamine. Keep f/u in 3 months    Brooke Bonito, MSN, FNP-BC, AGACNP-BC [facility]

## 2022-11-04 ENCOUNTER — Encounter: Payer: Self-pay | Admitting: Gastroenterology

## 2022-11-04 ENCOUNTER — Other Ambulatory Visit: Payer: Self-pay | Admitting: Gastroenterology

## 2022-11-04 ENCOUNTER — Ambulatory Visit (INDEPENDENT_AMBULATORY_CARE_PROVIDER_SITE_OTHER): Payer: BC Managed Care – PPO | Admitting: Gastroenterology

## 2022-11-04 ENCOUNTER — Telehealth: Payer: Self-pay | Admitting: *Deleted

## 2022-11-04 VITALS — BP 122/79 | HR 77 | Temp 98.4°F | Ht 66.0 in | Wt 182.4 lb

## 2022-11-04 DIAGNOSIS — J209 Acute bronchitis, unspecified: Secondary | ICD-10-CM | POA: Diagnosis not present

## 2022-11-04 DIAGNOSIS — D649 Anemia, unspecified: Secondary | ICD-10-CM

## 2022-11-04 DIAGNOSIS — K7460 Unspecified cirrhosis of liver: Secondary | ICD-10-CM

## 2022-11-04 DIAGNOSIS — K219 Gastro-esophageal reflux disease without esophagitis: Secondary | ICD-10-CM

## 2022-11-04 DIAGNOSIS — R112 Nausea with vomiting, unspecified: Secondary | ICD-10-CM | POA: Diagnosis not present

## 2022-11-04 MED ORDER — ONDANSETRON 4 MG PO TBDP: 4.0000 mg | ORAL_TABLET | Freq: Three times a day (TID) | ORAL | 1 refills | Status: DC | PRN

## 2022-11-04 MED ORDER — PANTOPRAZOLE SODIUM 40 MG PO TBEC: 40.0000 mg | DELAYED_RELEASE_TABLET | Freq: Two times a day (BID) | ORAL | 5 refills | Status: DC

## 2022-11-04 MED ORDER — ONDANSETRON 4 MG PO TBDP: 4.0000 mg | ORAL_TABLET | Freq: Three times a day (TID) | ORAL | 0 refills | Status: DC | PRN

## 2022-11-04 NOTE — Patient Instructions (Addendum)
We are going to reschedule you for your upper endoscopy.  I have sent a refill of Zofran for you to take up to 3 times a day as needed.  Please be mindful that when you take this with your albuterol and this can cause a change in heart rhythm.  If you develop any increased chest pain or feelings of your heart being out of rhythm please notify us immediately and stop taking the Zofran.  You will have an EKG prior to your upper endoscopy so we can evaluate any potential issues at that time.  Continue Lasix 40 mg daily and spironolactone 150 mg daily.  Continue lactulose 15 mL twice daily, titrate for 3-4 bowel movements daily.  If you need to you may take 30 mL in the morning and 15 mL in the evening to achieve this.  Continue pantoprazole 40 mg twice daily, sent a refill for you today.  Continue carvedilol 3.125 mg twice daily.  Please ensure you are taking Xifaxan at home.  If you are not taking this then you need to go to the pharmacy to pick this up.  Given that your insurance is reinstated please call [facility] to reschedule your transplant evaluation.  Please also make an appointment with your primary care.  Cirrhosis Lifestyle Recommendations:  High-protein diet from a primarily plant-based diet. Avoid red meat.  No raw or undercooked meat, seafood, or shellfish. Low-fat/cholesterol/carbohydrate diet. Limit sodium to no more than 2000 mg/day including everything that you eat and drink. Recommend at least 30 minutes of aerobic and resistance exercise 3 days/week. Limit Tylenol to 2000 mg daily.   Start drinking 1-2 Ensure/boost daily.  Please have blood work completed at [facility].  We will call you with results once they have been received. Please allow 3-5 business days for review. 2 locations for [facility] in [place]:              1. [address], [place]              2. [address] [place], [place]   It was a pleasure to see you today. I want to create  trusting relationships with patients. If you receive a survey regarding your visit,  I greatly appreciate you taking time to fill this out on paper or through your MyChart. I value your feedback.  Brooke Bonito, MSN, FNP-BC, AGACNP-BC [facility]

## 2022-11-04 NOTE — Telephone Encounter (Signed)
LMOVM to return call  ASAP-EGD w/Dr.Carver, ASA 3

## 2022-11-05 DIAGNOSIS — K7460 Unspecified cirrhosis of liver: Secondary | ICD-10-CM | POA: Diagnosis not present

## 2022-11-06 LAB — COMPREHENSIVE METABOLIC PANEL
ALT: 21 IU/L (ref 0–44)
AST: 66 IU/L — ABNORMAL HIGH (ref 0–40)
Albumin/Globulin Ratio: 0.9 — ABNORMAL LOW (ref 1.2–2.2)
Albumin: 3.1 g/dL — ABNORMAL LOW (ref 3.8–4.9)
Alkaline Phosphatase: 294 IU/L — ABNORMAL HIGH (ref 44–121)
BUN/Creatinine Ratio: 5 — ABNORMAL LOW (ref 9–20)
BUN: 5 mg/dL — ABNORMAL LOW (ref 6–24)
Bilirubin Total: 3.3 mg/dL — ABNORMAL HIGH (ref 0.0–1.2)
CO2: 28 mmol/L (ref 20–29)
Calcium: 8.6 mg/dL — ABNORMAL LOW (ref 8.7–10.2)
Chloride: 91 mmol/L — ABNORMAL LOW (ref 96–106)
Creatinine, Ser: 1.11 mg/dL (ref 0.76–1.27)
Globulin, Total: 3.4 g/dL (ref 1.5–4.5)
Glucose: 104 mg/dL — ABNORMAL HIGH (ref 70–99)
Potassium: 3.1 mmol/L — ABNORMAL LOW (ref 3.5–5.2)
Sodium: 135 mmol/L (ref 134–144)
Total Protein: 6.5 g/dL (ref 6.0–8.5)
eGFR: 77 mL/min/{1.73_m2} (ref >59)

## 2022-11-06 LAB — PROTIME-INR
INR: 1.5 — ABNORMAL HIGH (ref 0.9–1.2)
Prothrombin Time: 16 s — ABNORMAL HIGH (ref 9.1–12.0)

## 2022-11-09 ENCOUNTER — Other Ambulatory Visit: Payer: Self-pay | Admitting: *Deleted

## 2022-11-09 DIAGNOSIS — R7989 Other specified abnormal findings of blood chemistry: Secondary | ICD-10-CM

## 2022-11-09 DIAGNOSIS — K7460 Unspecified cirrhosis of liver: Secondary | ICD-10-CM

## 2022-11-10 ENCOUNTER — Other Ambulatory Visit: Payer: Self-pay

## 2022-11-10 ENCOUNTER — Telehealth: Payer: Self-pay | Admitting: Gastroenterology

## 2022-11-10 DIAGNOSIS — D696 Thrombocytopenia, unspecified: Secondary | ICD-10-CM

## 2022-11-10 NOTE — Telephone Encounter (Signed)
Called pt, several times, mailbox is full. I can not give the sister any information, she is not on his (DPR).

## 2022-11-10 NOTE — Telephone Encounter (Signed)
Patient's sister, Rivka Safer, came in this morning to say the medication keeps getting denied.  She asked if someone could call her or the patient's girlfriend, Brooke.  They are unsure why the med keeps getting denied.  Kathy's # is [phone]

## 2022-11-11 ENCOUNTER — Inpatient Hospital Stay: Payer: BC Managed Care – PPO | Attending: Hematology

## 2022-11-11 DIAGNOSIS — K219 Gastro-esophageal reflux disease without esophagitis: Secondary | ICD-10-CM | POA: Insufficient documentation

## 2022-11-11 DIAGNOSIS — Z794 Long term (current) use of insulin: Secondary | ICD-10-CM | POA: Insufficient documentation

## 2022-11-11 DIAGNOSIS — D649 Anemia, unspecified: Secondary | ICD-10-CM | POA: Diagnosis not present

## 2022-11-11 DIAGNOSIS — F419 Anxiety disorder, unspecified: Secondary | ICD-10-CM | POA: Diagnosis not present

## 2022-11-11 DIAGNOSIS — G4730 Sleep apnea, unspecified: Secondary | ICD-10-CM | POA: Insufficient documentation

## 2022-11-11 DIAGNOSIS — I129 Hypertensive chronic kidney disease with stage 1 through stage 4 chronic kidney disease, or unspecified chronic kidney disease: Secondary | ICD-10-CM | POA: Insufficient documentation

## 2022-11-11 DIAGNOSIS — K7460 Unspecified cirrhosis of liver: Secondary | ICD-10-CM | POA: Diagnosis not present

## 2022-11-11 DIAGNOSIS — D696 Thrombocytopenia, unspecified: Secondary | ICD-10-CM | POA: Diagnosis not present

## 2022-11-11 DIAGNOSIS — Z801 Family history of malignant neoplasm of trachea, bronchus and lung: Secondary | ICD-10-CM | POA: Diagnosis not present

## 2022-11-11 DIAGNOSIS — G8929 Other chronic pain: Secondary | ICD-10-CM | POA: Diagnosis not present

## 2022-11-11 DIAGNOSIS — F32A Depression, unspecified: Secondary | ICD-10-CM | POA: Insufficient documentation

## 2022-11-11 DIAGNOSIS — Z79899 Other long term (current) drug therapy: Secondary | ICD-10-CM | POA: Diagnosis not present

## 2022-11-11 DIAGNOSIS — D539 Nutritional anemia, unspecified: Secondary | ICD-10-CM

## 2022-11-11 DIAGNOSIS — R42 Dizziness and giddiness: Secondary | ICD-10-CM | POA: Insufficient documentation

## 2022-11-11 DIAGNOSIS — E1122 Type 2 diabetes mellitus with diabetic chronic kidney disease: Secondary | ICD-10-CM | POA: Diagnosis not present

## 2022-11-11 DIAGNOSIS — F17210 Nicotine dependence, cigarettes, uncomplicated: Secondary | ICD-10-CM | POA: Diagnosis not present

## 2022-11-11 LAB — CBC WITH DIFFERENTIAL/PLATELET
Abs Immature Granulocytes: 0.08 10*3/uL — ABNORMAL HIGH (ref 0.00–0.07)
Basophils Absolute: 0.1 10*3/uL (ref 0.0–0.1)
Basophils Relative: 1 %
Eosinophils Absolute: 0.3 10*3/uL (ref 0.0–0.5)
Eosinophils Relative: 4 %
HCT: 29.1 % — ABNORMAL LOW (ref 39.0–52.0)
Hemoglobin: 10 g/dL — ABNORMAL LOW (ref 13.0–17.0)
Immature Granulocytes: 1 %
Lymphocytes Relative: 21 %
Lymphs Abs: 1.8 10*3/uL (ref 0.7–4.0)
MCH: 32.8 pg (ref 26.0–34.0)
MCHC: 34.4 g/dL (ref 30.0–36.0)
MCV: 95.4 fL (ref 80.0–100.0)
Monocytes Absolute: 0.8 10*3/uL (ref 0.1–1.0)
Monocytes Relative: 10 %
Neutro Abs: 5.4 10*3/uL (ref 1.7–7.7)
Neutrophils Relative %: 63 %
Platelets: 131 10*3/uL — ABNORMAL LOW (ref 150–400)
RBC: 3.05 MIL/uL — ABNORMAL LOW (ref 4.22–5.81)
RDW: 16.4 % — ABNORMAL HIGH (ref 11.5–15.5)
WBC: 8.4 10*3/uL (ref 4.0–10.5)
nRBC: 0 % (ref 0.0–0.2)

## 2022-11-11 LAB — RETICULOCYTES
Immature Retic Fract: 14.8 % (ref 2.3–15.9)
RBC.: 3.04 MIL/uL — ABNORMAL LOW (ref 4.22–5.81)
Retic Count, Absolute: 71.4 10*3/uL (ref 19.0–186.0)
Retic Ct Pct: 2.4 % (ref 0.4–3.1)

## 2022-11-11 LAB — IRON AND TIBC
Iron: 17 ug/dL — ABNORMAL LOW (ref 45–182)
Saturation Ratios: 6 % — ABNORMAL LOW (ref 17.9–39.5)
TIBC: 306 ug/dL (ref 250–450)
UIBC: 289 ug/dL

## 2022-11-11 LAB — BILIRUBIN, FRACTIONATED(TOT/DIR/INDIR)
Bilirubin, Direct: 1 mg/dL — ABNORMAL HIGH (ref 0.0–0.2)
Indirect Bilirubin: 1.9 mg/dL — ABNORMAL HIGH (ref 0.3–0.9)
Total Bilirubin: 2.9 mg/dL — ABNORMAL HIGH (ref 0.3–1.2)

## 2022-11-11 LAB — FERRITIN: Ferritin: 19 ng/mL — ABNORMAL LOW (ref 24–336)

## 2022-11-11 LAB — LACTATE DEHYDROGENASE: LDH: 214 U/L — ABNORMAL HIGH (ref 98–192)

## 2022-11-12 ENCOUNTER — Ambulatory Visit: Payer: Self-pay | Admitting: *Deleted

## 2022-11-12 LAB — MISC LABCORP TEST (SEND OUT): Labcorp test code: 121251

## 2022-11-12 NOTE — Patient Outreach (Signed)
  Care Coordination   11/12/2022 Name: Shawn Velazquez MRN: [id number] DOB: 25-Nov-1965   Care Coordination Outreach Attempts:  An unsuccessful telephone outreach was attempted today to offer the patient information about available care coordination services.  Follow Up Plan:  Additional outreach attempts will be made to offer the patient care coordination information and services.   Encounter Outcome:  No Answer  Voice mail box full  Care Coordination Interventions:  No, not indicated     Jens Siems L. Noelle Penner, RN, BSN, CCM [facility] Care Management Community Coordinator Office number [phone]

## 2022-11-13 LAB — HAPTOGLOBIN: Haptoglobin: 49 mg/dL (ref 29–370)

## 2022-11-16 ENCOUNTER — Encounter: Payer: Self-pay | Admitting: Neurology

## 2022-11-16 ENCOUNTER — Ambulatory Visit: Payer: BC Managed Care – PPO | Admitting: Neurology

## 2022-11-18 ENCOUNTER — Telehealth: Payer: Self-pay | Admitting: *Deleted

## 2022-11-18 NOTE — Progress Notes (Signed)
  Care Coordination Note  11/18/2022 Name: Shawn Velazquez MRN: [id number] DOB: 1965/08/27  Shawn Velazquez is a 57 y.o. year old male who is a primary care patient of [name], MD and is actively engaged with the care management team. I reached out to Delsa Bern by phone today to assist with re-scheduling a follow up visit with the RN Case Manager  Follow up plan: Unsuccessful telephone outreach attempt made. A HIPAA compliant phone message was left for the patient providing contact information and requesting a return call.   [facility]  Care Coordination Care Guide  Direct Dial: [phone]

## 2022-11-22 ENCOUNTER — Encounter (HOSPITAL_COMMUNITY): Payer: Self-pay | Admitting: Family Medicine

## 2022-11-22 ENCOUNTER — Telehealth: Payer: Self-pay | Admitting: Internal Medicine

## 2022-11-22 ENCOUNTER — Encounter (HOSPITAL_BASED_OUTPATIENT_CLINIC_OR_DEPARTMENT_OTHER): Payer: Self-pay | Admitting: Family Medicine

## 2022-11-22 DIAGNOSIS — R102 Pelvic and perineal pain: Secondary | ICD-10-CM | POA: Diagnosis not present

## 2022-11-22 DIAGNOSIS — R1030 Lower abdominal pain, unspecified: Secondary | ICD-10-CM | POA: Diagnosis not present

## 2022-11-22 NOTE — Telephone Encounter (Signed)
See previous note

## 2022-11-22 NOTE — Telephone Encounter (Signed)
Spoke to pt's sister (DPR) she needs clarification about Carafate. They were under the impression that he was suppose to be still taking it. Please advise.

## 2022-11-22 NOTE — Telephone Encounter (Signed)
Patient's sister, [name], has questions about his carafate. Please call [phone] (she and other sister, Lynden Ang) had patient sign a DPR

## 2022-11-22 NOTE — Progress Notes (Signed)
  Care Coordination Note  11/22/2022 Name: Shawn Velazquez MRN: [id number] DOB: [birth year]  Shawn Velazquez is a 57 y.o. year old male who is a primary care patient of [name], MD and is actively engaged with the care management team. I reached out to Delsa Bern by phone today to assist with re-scheduling a follow up visit with the RN Case Manager  Follow up plan: Unsuccessful telephone outreach attempt made. Unable to make contact on outreach attempts x 2. We have been unable to make contact with the patient for follow up. The care management team is available to follow up with the patient after provider conversation with the patient regarding recommendation for care management engagement and subsequent re-referral to the care management team.   [facility] Coordination Care Guide  Direct Dial: [phone]

## 2022-11-22 NOTE — Telephone Encounter (Signed)
Pt's sisters came in to sign a DPR and has questions about patient's carafate. Please call [name] at [phone]

## 2022-11-23 ENCOUNTER — Other Ambulatory Visit (HOSPITAL_COMMUNITY): Payer: Self-pay | Admitting: Family Medicine

## 2022-11-23 ENCOUNTER — Encounter: Payer: Self-pay | Admitting: *Deleted

## 2022-11-23 DIAGNOSIS — R102 Pelvic and perineal pain: Secondary | ICD-10-CM

## 2022-11-23 NOTE — Telephone Encounter (Signed)
Noted  

## 2022-11-23 NOTE — Telephone Encounter (Signed)
Attempted to call pt, mail box is full. Will mail letter

## 2022-11-24 NOTE — Progress Notes (Unsigned)
[facility] [address] [place], [place] [address]   CLINIC:  Medical Oncology/Hematology  PCP:  [name], MD [address] [place] Mor[place] [address] [phone]   REASON FOR VISIT:  Follow-up for macrocytic anemia and thrombocytopenia  CURRENT THERAPY: Surveillance  INTERVAL HISTORY:   Shawn Velazquez 57 y.o. male returns for routine follow-up of anemia and thrombocytopenia.  He was last seen by Rojelio Brenner PA-C on 09/16/2022.    Since his last visit, he was hospitalized from 09/24/2022 through 09/28/2022 due to gastrointestinal hemorrhage with hematemesis, found to have recurrent GI bleed in the setting of esophageal varices, s/p EGD and banding on 09/24/2022.  Lowest Hgb during that admission was 8.9, therefore he did not require blood transfusion during this admission.  At today's visit, he reports feeling fatigued.  *** *** He has "black coffee ground stool" about once a week. *** He denies any recurrent hematemesis or hematochezia since hospital discharge. *** He continues to follow closely with GI. *** He reports easy bruising but denies any petechial rash. *** No B symptoms, masses, or lymphadenopathy. *** He reports significant fatigue, ice pica, lightheadedness, and dyspnea on exertion.  He has ***% energy and ***% appetite. He endorses that he is maintaining a stable weight.  ASSESSMENT & PLAN:  1.  Thrombocytopenia & macrocytic anemia - Seen at the request of NP Lupita Raider for evaluation and treatment of anemia and thrombocytopenia. - Diagnosed with liver cirrhosis in January 2024 CT abdomen/pelvis (08/02/2022) showed nodular liver consistent with cirrhosis as well as stigmata of portal venous hypertension including moderate volume ascites, esophageal and gastric varices, and recanalization of the umbilical vein; there were some scattered liver lesions noted, but MRI liver appeared benign. Splenomegaly on MRI with spleen measuring about 14.4  cm. Follows with [facility].  - Hematology workup (08/18/2022): SPEP negative for M spike.  Polyclonal increase in immunoglobulins on immunofixation.  Normal FLC ratio 1.31 (elevated kappa 38.9/elevated lambda 29.7) B12 and MMA normal.  Normal copper, folate. No evidence of renal dysfunction based on creatinine 0.82/GFR >60 Ferritin 375, iron saturation 33% Elevated immature platelet fraction 8.7% LDH 210, reticulocytes 2.3%.  Bilirubin 2.3 (recently diagnosed liver cirrhosis and other elevations in LFTs are noted) HIV negative (08/03/2022).  Viral hepatitis panel obtained in September 2023 was negative.  ANA and RF were negative. - Hospitalized from 08/25/2022 through 08/29/2022 for hematemesis and hematochezia secondary to suspected upper GI bleed.  He had EGD with variceal (grade 2) banding on 08/26/2022, although no overt bleeding was identified; nonbleeding angiodysplastic lesion in duodenum.  He did not require any PRBC transfusions while hospitalized. - Hospitalized from 09/24/2022 through 09/28/2022 due to gastrointestinal hemorrhage with hematemesis, found to have recurrent GI bleed in the setting of esophageal varices, s/p EGD and banding on 09/24/2022.  Lowest Hgb during that admission was 8.9, therefore he did not require blood transfusion during this admission. - Colonoscopy on 04/05/2022 with nonbleeding internal hemorrhoids, polyps x 6, otherwise normal.   - Reports easy bruising.  No B symptoms.*** - He has had some intermittent melanotic stool about once a week since his hospitalization in February 2024*** - Most recent labs (11/11/2022):  Hgb 10.0/MCV 95.8, platelets 131 Ferritin 19, iron saturation 6%. Reticulocytes 2.4%, haptoglobin normal at 49.  Elevated total bilirubin 2.9 (direct bilirubin 1.0, indirect bilirubin 1.9).  LDH mildly elevated at 214. Cystatin C elevated at 1.24, correlating to eGFR around 68 (per CKD-EPI creatinine cystatin equation) -  DIFFERENTIAL DIAGNOSIS favors thrombocytopenia and macrocytic anemia secondary to liver cirrhosis/splenomegaly.  He has acute worsening of his anemia secondary to recent upper GI bleed. - PLAN: Acute worsening of anemia likely due to recent upper GI bleed.  Thrombocytopenia secondary to splenomegaly is stable. - Recommend IV Venofer 300 mg x 4.  (Potential risks/side effects discussed, and patient is agreeable.) - Recommend starting ferrous sulfate 325 mg daily.  *** - Monthly CBC/D+ BB sample - Labs in 2 months (CBC/D, ferritin, iron/TIBC) with OFFICE visit *** - If he has persistent anemia despite resolution of iron deficiency, consider rechecking hemolysis labs.   2.  Other history - PMH: Liver cirrhosis, vertigo, sleep apnea, psoriasis, hypertension, GERD, diabetes, chronic back pain, anxiety/depression - TOBACCO: Currently smokes 0.5 PPD cigarettes.  Has been smoking 0.25-0.5 PPD since age 23, but had quit for about 10 years.  Total pack-year history approximately 14. - ALCOHOL: Heavy alcohol consumption for over 30 years.  Has cut back to 1 to 2 cans of beer each week following diagnosis of liver cirrhosis in February 2024. - DRUG/SUBSTANCE ABUSE: Reports previous addiction to hydrocodone pills, which he self reported to his PCP; hydrocodone was discontinued by PCP at the request of the patient. - SOCIAL he works for [employer], but has not been back to work following hospitalization for cirrhosis in January/February 2024.  He is accompanied in clinic by his girlfriend, [name]. - FAMILY: No family history of blood problems.  Maternal grandfather had lung cancer.  Father deceased secondary to mesothelioma.  PLAN SUMMARY: >> ***     REVIEW OF SYSTEMS: ***  Review of Systems  Constitutional:  Positive for fatigue. Negative for appetite change, chills, diaphoresis, fever and unexpected weight change.  HENT:   Negative for lump/mass and nosebleeds.   Eyes:  Negative for eye problems.   Respiratory:  Positive for cough and shortness of breath. Negative for hemoptysis.   Cardiovascular:  Negative for chest pain, leg swelling and palpitations.  Gastrointestinal:  Positive for blood in stool, diarrhea, nausea and vomiting. Negative for abdominal pain and constipation.  Genitourinary:  Negative for hematuria.   Skin: Negative.   Neurological:  Positive for dizziness, headaches and numbness. Negative for light-headedness.  Hematological:  Does not bruise/bleed easily.  Psychiatric/Behavioral:  Positive for depression and sleep disturbance. The patient is nervous/anxious.      PHYSICAL EXAM:***  ECOG PERFORMANCE STATUS: 1 - Symptomatic but completely ambulatory  There were no vitals filed for this visit. There were no vitals filed for this visit. Physical Exam Constitutional:      Appearance: Normal appearance. He is obese.  Cardiovascular:     Heart sounds: Normal heart sounds.  Pulmonary:     Breath sounds: Wheezing present.  Neurological:     General: No focal deficit present.     Mental Status: Mental status is at baseline.  Psychiatric:        Behavior: Behavior normal. Behavior is cooperative.     PAST MEDICAL/SURGICAL HISTORY:  Past Medical History:  Diagnosis Date   Anxiety    Arthritis    affects hands, shoulder, neck, knees, hips, ankles, toes   Chronic back pain    Diabetes mellitus    diet controlled   Eczema    Fatty liver, alcoholic    GERD (gastroesophageal reflux disease)    Glaucoma    slight case   Hypertension    Psoriasis    Sleep apnea    Substance abuse (HCC)    Vertigo  09/11/2017   Wears glasses    Past Surgical History:  Procedure Laterality Date   COLONOSCOPY WITH PROPOFOL N/A 04/05/2022   Procedure: COLONOSCOPY WITH PROPOFOL;  Surgeon: Lanelle Bal, DO;  [facility];  Service: Endoscopy;  Laterality: N/A;  10:45 AM,unable to reach pt to move up   ESOPHAGEAL BANDING N/A 08/26/2022   Procedure: ESOPHAGEAL  BANDING;  Surgeon: Lanelle Bal, DO;  [facility];  Service: Endoscopy;  Laterality: N/A;   ESOPHAGEAL BANDING  09/24/2022   Procedure: ESOPHAGEAL BANDING;  Surgeon: Corbin Ade, MD;  [facility]: [facility];  Service: Endoscopy;;   ESOPHAGOGASTRODUODENOSCOPY (EGD) WITH PROPOFOL N/A 08/26/2022   Procedure: ESOPHAGOGASTRODUODENOSCOPY (EGD) WITH PROPOFOL;  Surgeon: Lanelle Bal, DO;  [facility];  Service: Endoscopy;  Laterality: N/A;   ESOPHAGOGASTRODUODENOSCOPY (EGD) WITH PROPOFOL N/A 09/24/2022   Procedure: ESOPHAGOGASTRODUODENOSCOPY (EGD) WITH PROPOFOL;  Surgeon: Corbin Ade, MD;  [facility];  Service: Endoscopy;  Laterality: N/A;  hematemesis, esophageal varices, melena   MULTIPLE TOOTH EXTRACTIONS     POLYPECTOMY  04/05/2022   Procedure: POLYPECTOMY;  Surgeon: Lanelle Bal, DO;  [facility];  Service: Endoscopy;;   POSTERIOR CERVICAL LAMINECTOMY  04/21/2012   Procedure: POSTERIOR CERVICAL LAMINECTOMY;  Surgeon: Mariam Dollar, MD;  [facility];  Service: Neurosurgery;  Laterality: Left;  Posterior Cervical laminectomy/foraminotomy and diskectomy, left cervical seven-thoracic one   ROTATOR CUFF REPAIR     THORACIC DISCECTOMY Left 04/03/2014   Procedure: Left Thoracic Seven to Eight, Thoracic Eight to Nine Thoracic Discectomy ;  Surgeon: Mariam Dollar, MD;  Location: [facility];  Service: Neurosurgery;  Laterality: Left;    SOCIAL HISTORY:  Social History   Socioeconomic History   Marital status: Divorced    Spouse name: Not on file   Number of children: Not on file   Years of education: Not on file   Highest education level: Not on file  Occupational History    Comment: Truck to storage  Tobacco Use   Smoking status: Every Day    Packs/day: 0.25    Years: 40.00    Additional pack years: 0.00    Total pack years: 10.00    Types: Cigarettes    Last attempt to quit: 05/09/2022    Years since quitting: 0.5    Smokeless tobacco: Never   Tobacco comments:    pt trying to quit on his own  Vaping Use   Vaping Use: Former  Substance and Sexual Activity   Alcohol use: Not Currently    Alcohol/week: 12.0 standard drinks of alcohol    Types: 12 Cans of beer per week    Comment: Either Smirnoff Smash-4 per day or Fireball-10 airplane bottles in a day   Drug use: No   Sexual activity: Yes    Birth control/protection: None  Other Topics Concern   Not on file  Social History Narrative   Not on file   Social Determinants of Health   Financial Resource Strain: Medium Risk (10/07/2022)   Overall Financial Resource Strain (CARDIA)    Difficulty of Paying Living Expenses: Somewhat hard  Food Insecurity: No Food Insecurity (10/07/2022)   Hunger Vital Sign    Worried About Running Out of Food in the Last Year: Never true    Ran Out of Food in the Last Year: Never true  Transportation Needs: No Transportation Needs (10/07/2022)   PRAPARE - Transportation    Lack of  Transportation (Medical): No    Lack of Transportation (Non-Medical): No  Physical Activity: Not on file  Stress: Stress Concern Present (10/07/2022)   [employer] of Occupational Health - Occupational Stress Questionnaire    Feeling of Stress : To some extent  Social Connections: Not on file  Intimate Partner Violence: Not At Risk (08/26/2022)   Humiliation, Afraid, Rape, and Kick questionnaire    Fear of Current or Ex-Partner: No    Emotionally Abused: No    Physically Abused: No    Sexually Abused: No    FAMILY HISTORY:  Family History  Problem Relation Age of Onset   Diabetes Father    Cancer - Lung Father    Hypertension Other    Cancer - Lung Other     CURRENT MEDICATIONS:  Outpatient Encounter Medications as of 11/25/2022  Medication Sig   albuterol (VENTOLIN HFA) 108 (90 Base) MCG/ACT inhaler Inhale 2 puffs into the lungs every 6 (six) hours as needed for wheezing or shortness of breath.   busPIRone (BUSPAR) 5 MG tablet  Take 1 tablet by mouth daily as needed.   carvedilol (COREG) 3.125 MG tablet Take 1 tablet (3.125 mg total) by mouth 2 (two) times daily with a meal.   Continuous Blood Gluc Sensor (DEXCOM G7 SENSOR) MISC Inject 1 application. into the skin as directed. Change sensor every 10 days as directed.   FLUoxetine (PROZAC) 10 MG capsule TAKE 4 CAPSULES BY MOUTH EVERY DAY (Patient taking differently: Take 10 mg by mouth See admin instructions. TAKE 2 CAPSULES IN THE MORNING AND 2 CAPSULES EVERY EVENING.)   folic acid (FOLVITE) 1 MG tablet TAKE 1 TABLET BY MOUTH DAILY   furosemide (LASIX) 40 MG tablet Take 1 tablet (40 mg total) by mouth daily.   gabapentin (NEURONTIN) 100 MG capsule Take 1 tablet by mouth daily.   glucose blood test strip Use as instructed to monitor glucose 4 times daily   insulin glargine (LANTUS SOLOSTAR) 100 UNIT/ML Solostar Pen Inject 35 Units into the skin at bedtime.   insulin lispro (HUMALOG KWIKPEN) 100 UNIT/ML KwikPen Inject 10-16 Units into the skin 3 (three) times daily.   Insulin Pen Needle (PEN NEEDLES) 32G X 4 MM MISC 1 each by Does not apply route in the morning, at noon, in the evening, and at bedtime. Use to inject insulin 4 times daily   lactulose (CHRONULAC) 10 GM/15ML solution Take 15 mLs (10 g total) by mouth 2 (two) times daily.   levocetirizine (XYZAL) 5 MG tablet Take 1 tablet by mouth daily.   Multiple Vitamin (MULTIVITAMIN) tablet Take 1 tablet by mouth daily.   ondansetron (ZOFRAN-ODT) 4 MG disintegrating tablet Take 1 tablet (4 mg total) by mouth every 8 (eight) hours as needed for nausea or vomiting.   pantoprazole (PROTONIX) 40 MG tablet Take 1 tablet (40 mg total) by mouth 2 (two) times daily.   polyethylene glycol (MIRALAX / GLYCOLAX) 17 g packet Take 17 g by mouth daily as needed for mild constipation.   rifaximin (XIFAXAN) 550 MG TABS tablet Take 1 tablet (550 mg total) by mouth 2 (two) times daily.   spironolactone (ALDACTONE) 50 MG tablet Take 3 tablets  (150 mg total) by mouth daily.   sucralfate (CARAFATE) 1 GM/10ML suspension Take 10 mLs (1 g total) by mouth 4 (four) times daily -  with meals and at bedtime for 14 days.   thiamine (VITAMIN B-1) 100 MG tablet Take 1 tablet (100 mg total) by mouth daily.  No facility-administered encounter medications on file as of 11/25/2022.    ALLERGIES:  Allergies  Allergen Reactions   Neosporin + Pain Relief Max St [Neomy-Bacit-Polymyx-Pramoxine] Rash    LABORATORY DATA:  I have reviewed the labs as listed.  CBC    Component Value Date/Time   WBC 8.4 11/11/2022 1023   RBC 3.04 (L) 11/11/2022 1024   RBC 3.05 (L) 11/11/2022 1023   HGB 10.0 (L) 11/11/2022 1023   HGB 14.4 07/31/2021 1204   HCT 29.1 (L) 11/11/2022 1023   HCT 42.4 07/31/2021 1204   PLT 131 (L) 11/11/2022 1023   PLT 146 (L) 07/31/2021 1204   MCV 95.4 11/11/2022 1023   MCV 97 07/31/2021 1204   MCH 32.8 11/11/2022 1023   MCHC 34.4 11/11/2022 1023   RDW 16.4 (H) 11/11/2022 1023   RDW 11.8 07/31/2021 1204   LYMPHSABS 1.8 11/11/2022 1023   LYMPHSABS 1.6 07/31/2021 1204   MONOABS 0.8 11/11/2022 1023   EOSABS 0.3 11/11/2022 1023   EOSABS 0.2 07/31/2021 1204   BASOSABS 0.1 11/11/2022 1023   BASOSABS 0.1 07/31/2021 1204      Latest Ref Rng & Units 11/11/2022   10:23 AM 11/05/2022   10:28 AM 09/28/2022    4:38 AM  CMP  Glucose 70 - 99 mg/dL  161  096   BUN 6 - 24 mg/dL  5  10   Creatinine 0.45 - 1.27 mg/dL  4.09  8.11   Sodium 914 - 144 mmol/L  135  131   Potassium 3.5 - 5.2 mmol/L  3.1  3.7   Chloride 96 - 106 mmol/L  91  96   CO2 20 - 29 mmol/L  28  26   Calcium 8.7 - 10.2 mg/dL  8.6  7.6   Total Protein 6.0 - 8.5 g/dL  6.5  5.6   Total Bilirubin 0.3 - 1.2 mg/dL 2.9  3.3  2.7   Alkaline Phos 44 - 121 IU/L  294  209   AST 0 - 40 IU/L  66  128   ALT 0 - 44 IU/L  21  36     DIAGNOSTIC IMAGING:  I have independently reviewed the relevant imaging and discussed with the patient.   WRAP UP:  All questions were answered.  The patient knows to call the clinic with any problems, questions or concerns.  Medical decision making: Moderate***  Time spent on visit: I spent 20 minutes counseling the patient face to face. The total time spent in the appointment was 30 minutes and more than 50% was on counseling.  Carnella Guadalajara, PA-C  ***

## 2022-11-25 ENCOUNTER — Encounter: Payer: Self-pay | Admitting: Physician Assistant

## 2022-11-25 ENCOUNTER — Inpatient Hospital Stay: Payer: BC Managed Care – PPO | Admitting: Physician Assistant

## 2022-11-25 ENCOUNTER — Inpatient Hospital Stay: Payer: BC Managed Care – PPO

## 2022-11-25 ENCOUNTER — Other Ambulatory Visit: Payer: Self-pay

## 2022-11-25 VITALS — BP 94/54 | HR 62 | Temp 98.4°F | Resp 18 | Wt 185.4 lb

## 2022-11-25 DIAGNOSIS — F17210 Nicotine dependence, cigarettes, uncomplicated: Secondary | ICD-10-CM | POA: Diagnosis not present

## 2022-11-25 DIAGNOSIS — K7460 Unspecified cirrhosis of liver: Secondary | ICD-10-CM | POA: Diagnosis not present

## 2022-11-25 DIAGNOSIS — D696 Thrombocytopenia, unspecified: Secondary | ICD-10-CM | POA: Diagnosis not present

## 2022-11-25 DIAGNOSIS — Z801 Family history of malignant neoplasm of trachea, bronchus and lung: Secondary | ICD-10-CM | POA: Diagnosis not present

## 2022-11-25 DIAGNOSIS — F32A Depression, unspecified: Secondary | ICD-10-CM | POA: Diagnosis not present

## 2022-11-25 DIAGNOSIS — D500 Iron deficiency anemia secondary to blood loss (chronic): Secondary | ICD-10-CM | POA: Diagnosis not present

## 2022-11-25 DIAGNOSIS — K219 Gastro-esophageal reflux disease without esophagitis: Secondary | ICD-10-CM | POA: Diagnosis not present

## 2022-11-25 DIAGNOSIS — D539 Nutritional anemia, unspecified: Secondary | ICD-10-CM

## 2022-11-25 DIAGNOSIS — E1122 Type 2 diabetes mellitus with diabetic chronic kidney disease: Secondary | ICD-10-CM | POA: Diagnosis not present

## 2022-11-25 DIAGNOSIS — G4730 Sleep apnea, unspecified: Secondary | ICD-10-CM | POA: Diagnosis not present

## 2022-11-25 DIAGNOSIS — R42 Dizziness and giddiness: Secondary | ICD-10-CM | POA: Diagnosis not present

## 2022-11-25 DIAGNOSIS — Z794 Long term (current) use of insulin: Secondary | ICD-10-CM | POA: Diagnosis not present

## 2022-11-25 DIAGNOSIS — I129 Hypertensive chronic kidney disease with stage 1 through stage 4 chronic kidney disease, or unspecified chronic kidney disease: Secondary | ICD-10-CM | POA: Diagnosis not present

## 2022-11-25 DIAGNOSIS — F419 Anxiety disorder, unspecified: Secondary | ICD-10-CM | POA: Diagnosis not present

## 2022-11-25 DIAGNOSIS — Z79899 Other long term (current) drug therapy: Secondary | ICD-10-CM | POA: Diagnosis not present

## 2022-11-25 DIAGNOSIS — D649 Anemia, unspecified: Secondary | ICD-10-CM | POA: Diagnosis not present

## 2022-11-25 DIAGNOSIS — G8929 Other chronic pain: Secondary | ICD-10-CM | POA: Diagnosis not present

## 2022-11-25 HISTORY — DX: Iron deficiency anemia secondary to blood loss (chronic): D50.0

## 2022-11-25 LAB — CBC
HCT: 28.3 % — ABNORMAL LOW (ref 39.0–52.0)
Hemoglobin: 9.5 g/dL — ABNORMAL LOW (ref 13.0–17.0)
MCH: 31.8 pg (ref 26.0–34.0)
MCHC: 33.6 g/dL (ref 30.0–36.0)
MCV: 94.6 fL (ref 80.0–100.0)
Platelets: 130 10*3/uL — ABNORMAL LOW (ref 150–400)
RBC: 2.99 MIL/uL — ABNORMAL LOW (ref 4.22–5.81)
RDW: 15.3 % (ref 11.5–15.5)
WBC: 7.4 10*3/uL (ref 4.0–10.5)
nRBC: 0 % (ref 0.0–0.2)

## 2022-11-25 LAB — SAMPLE TO BLOOD BANK

## 2022-11-25 NOTE — Patient Instructions (Addendum)
[facility] at [facility] **VISIT SUMMARY & IMPORTANT INSTRUCTIONS **   You were seen today by Rojelio Brenner PA-C for your anemia (low red blood cells) and thrombocytopenia (low platelets).    IRON DEFICIENCY ANEMIA You have low iron and low hemoglobin/red blood cells due to your GI bleeding (bleeding from your esophagus and stomach related to your liver cirrhosis) We will schedule you for IV iron x 4 doses. Please take cetirizine 10 mg (over-the-counter allergy pill) at home 30 minutes before each scheduled iron infusion.  (If you do not take this medication at home, it will be provided for you at the [facility], but will lengthen your appointment time.)  Please see the attached handout for important information regarding potential side effects of IV iron. I recommend that you also start taking daily iron supplement (ferrous sulfate 325 mg) daily.  If this causes significant constipation, you can take stool softener or change to every other day dosing of iron tablet.  LOW PLATELETS ("THROMBOCYTOPENIA") Your low platelets are related to your liver cirrhosis and enlarged spleen. You do not need any treatment of your low platelets, but we will continue to monitor this at follow-up visits.  We will check your blood count in 1 month and will check full lab panel lab panel and see you for office visit in 2 months.   ** Thank you for trusting me with your healthcare!  I strive to provide all of my patients with quality care at each visit.  If you receive a survey for this visit, I would be so grateful to you for taking the time to provide feedback.  Thank you in advance!  ~ Cire Clute                   Dr. Doreatha Massed   &   Rojelio Brenner, PA-C   - - - - - - - - - - - - - - - - - -    Thank you for choosing [facility] at [facility] to provide your oncology and hematology care.  To afford each patient quality time with our provider,  please arrive at least 15 minutes before your scheduled appointment time.   If you have a lab appointment with the Cancer Center please come in thru the Main Entrance and check in at the main information desk.  You need to re-schedule your appointment should you arrive 10 or more minutes late.  We strive to give you quality time with our providers, and arriving late affects you and other patients whose appointments are after yours.  Also, if you no show three or more times for appointments you may be dismissed from the clinic at the providers discretion.     Again, thank you for choosing [facility].  Our hope is that these requests will decrease the amount of time that you wait before being seen by our physicians.       _____________________________________________________________  Should you have questions after your visit to [facility], please contact our office at [phone] and follow the prompts.  Our office hours are 8:00 a.m. and 4:30 p.m. Monday - Friday.  Please note that voicemails left after 4:00 p.m. may not be returned until the following business day.  We are closed weekends and major holidays.  You do have access to a nurse 24-7, just call the main number to the clinic [phone] and do not press  any options, hold on the line and a nurse will answer the phone.    For prescription refill requests, have your pharmacy contact our office and allow 72 hours.

## 2022-12-01 ENCOUNTER — Other Ambulatory Visit (INDEPENDENT_AMBULATORY_CARE_PROVIDER_SITE_OTHER): Payer: Self-pay | Admitting: Gastroenterology

## 2022-12-03 ENCOUNTER — Telehealth: Payer: Self-pay | Admitting: Gastroenterology

## 2022-12-03 ENCOUNTER — Inpatient Hospital Stay: Payer: BC Managed Care – PPO

## 2022-12-03 VITALS — BP 113/73 | HR 59 | Temp 97.9°F | Resp 16

## 2022-12-03 DIAGNOSIS — G8929 Other chronic pain: Secondary | ICD-10-CM | POA: Diagnosis not present

## 2022-12-03 DIAGNOSIS — D696 Thrombocytopenia, unspecified: Secondary | ICD-10-CM | POA: Diagnosis not present

## 2022-12-03 DIAGNOSIS — I959 Hypotension, unspecified: Secondary | ICD-10-CM

## 2022-12-03 DIAGNOSIS — K7460 Unspecified cirrhosis of liver: Secondary | ICD-10-CM | POA: Diagnosis not present

## 2022-12-03 DIAGNOSIS — K219 Gastro-esophageal reflux disease without esophagitis: Secondary | ICD-10-CM | POA: Diagnosis not present

## 2022-12-03 DIAGNOSIS — I129 Hypertensive chronic kidney disease with stage 1 through stage 4 chronic kidney disease, or unspecified chronic kidney disease: Secondary | ICD-10-CM | POA: Diagnosis not present

## 2022-12-03 DIAGNOSIS — Z79899 Other long term (current) drug therapy: Secondary | ICD-10-CM | POA: Diagnosis not present

## 2022-12-03 DIAGNOSIS — D500 Iron deficiency anemia secondary to blood loss (chronic): Secondary | ICD-10-CM

## 2022-12-03 DIAGNOSIS — Z801 Family history of malignant neoplasm of trachea, bronchus and lung: Secondary | ICD-10-CM | POA: Diagnosis not present

## 2022-12-03 DIAGNOSIS — F17210 Nicotine dependence, cigarettes, uncomplicated: Secondary | ICD-10-CM | POA: Diagnosis not present

## 2022-12-03 DIAGNOSIS — F419 Anxiety disorder, unspecified: Secondary | ICD-10-CM | POA: Diagnosis not present

## 2022-12-03 DIAGNOSIS — E1122 Type 2 diabetes mellitus with diabetic chronic kidney disease: Secondary | ICD-10-CM | POA: Diagnosis not present

## 2022-12-03 DIAGNOSIS — D649 Anemia, unspecified: Secondary | ICD-10-CM | POA: Diagnosis not present

## 2022-12-03 DIAGNOSIS — F32A Depression, unspecified: Secondary | ICD-10-CM | POA: Diagnosis not present

## 2022-12-03 DIAGNOSIS — Z794 Long term (current) use of insulin: Secondary | ICD-10-CM | POA: Diagnosis not present

## 2022-12-03 DIAGNOSIS — G4730 Sleep apnea, unspecified: Secondary | ICD-10-CM | POA: Diagnosis not present

## 2022-12-03 DIAGNOSIS — R42 Dizziness and giddiness: Secondary | ICD-10-CM | POA: Diagnosis not present

## 2022-12-03 DIAGNOSIS — D539 Nutritional anemia, unspecified: Secondary | ICD-10-CM

## 2022-12-03 LAB — CBC
HCT: 29 % — ABNORMAL LOW (ref 39.0–52.0)
Hemoglobin: 10.6 g/dL — ABNORMAL LOW (ref 13.0–17.0)
MCH: 34 pg (ref 26.0–34.0)
MCHC: 36.6 g/dL — ABNORMAL HIGH (ref 30.0–36.0)
MCV: 92.9 fL (ref 80.0–100.0)
Platelets: 174 10*3/uL (ref 150–400)
RBC: 3.12 MIL/uL — ABNORMAL LOW (ref 4.22–5.81)
RDW: 16.5 % — ABNORMAL HIGH (ref 11.5–15.5)
WBC: 11.7 10*3/uL — ABNORMAL HIGH (ref 4.0–10.5)
nRBC: 0 % (ref 0.0–0.2)

## 2022-12-03 LAB — SAMPLE TO BLOOD BANK

## 2022-12-03 MED ORDER — SODIUM CHLORIDE 0.9 % IV SOLN: INTRAVENOUS | Status: DC

## 2022-12-03 MED ORDER — SODIUM CHLORIDE 0.9 % IV SOLN: Freq: Once | INTRAVENOUS | Status: AC

## 2022-12-03 MED ORDER — CETIRIZINE HCL 10 MG PO TABS
10.0000 mg | ORAL_TABLET | Freq: Once | ORAL | Status: AC
  Administered 2022-12-03: 10 mg via ORAL
  Filled 2022-12-03: qty 1

## 2022-12-03 MED ORDER — SODIUM CHLORIDE 0.9 % IV SOLN
300.0000 mg | Freq: Once | INTRAVENOUS | Status: AC
  Administered 2022-12-03: 300 mg via INTRAVENOUS
  Filled 2022-12-03: qty 15

## 2022-12-03 NOTE — Patient Instructions (Addendum)
** IMPORTANT INSTRUCTIONS from Carnella Guadalajara, PA-C **  You had low blood pressure (94/57) during your visit today.  Your blood pressure improved to 123/72 after you were given IV fluids.  Your low blood pressure was likely due to mild dehydration.  You are on several medications for your liver cirrhosis that help to pull extra fluid off of your body.  This makes it easier for you to become dehydrated.  Make sure that you are drinking at least 64 ounces of water daily.  We spoke with your GI/liver specialist Brooke Bonito, NP), who recommended the following: DECREASE your spironolactone so that you are taking 100 mg (50 mg tablet x 2) instead of your previously prescribed dose of 150 mg (3 tablets). Call your GI office [facility] at [phone]) to schedule an appointment with Toni Amend next week.   - - - - - - - - - - - - - - - - - -     [facility]  Discharge Instructions: Thank you for choosing [facility] to provide your oncology and hematology care.  If you have a lab appointment with the Cancer Center - please note that after April 8th, 2024, all labs will be drawn in the cancer center.  You do not have to check in or register with the main entrance as you have in the past but will complete your check-in in the cancer center.  Wear comfortable clothing and clothing appropriate for easy access to any Portacath or PICC line.   We strive to give you quality time with your provider. You may need to reschedule your appointment if you arrive late (15 or more minutes).  Arriving late affects you and other patients whose appointments are after yours.  Also, if you miss three or more appointments without notifying the office, you may be dismissed from the clinic at the provider's discretion.      For prescription refill requests, have your pharmacy contact our office and allow 72 hours for refills to be completed.     Today you received the following Venofer.  Iron Sucrose Injection What is this medication? IRON SUCROSE (EYE ern SOO krose) treats low levels of iron (iron deficiency anemia) in people with kidney disease. Iron is a mineral that plays an important role in making red blood cells, which carry oxygen from your lungs to the rest of your body. This medicine may be used for other purposes; ask your health care provider or pharmacist if you have questions. COMMON BRAND NAME(S): Venofer What should I tell my care team before I take this medication? They need to know if you have any of these conditions: Anemia not caused by low iron levels Heart disease High levels of iron in the blood Kidney disease Liver disease An unusual or allergic reaction to iron, other medications, foods, dyes, or preservatives Pregnant or trying to get pregnant Breastfeeding How should I use this medication? This medication is for infusion into a vein. It is given in a hospital or clinic setting. Talk to your care team about the use of this medication in children. While this medication may be prescribed for children as young as 2 years for selected conditions, precautions do apply. Overdosage: If you think you have taken too much of this medicine contact a poison control center or emergency room at once. NOTE: This medicine is only for you. Do not share this medicine with others. What if I miss a dose? Keep  appointments for follow-up doses. It is important not to miss your dose. Call your care team if you are unable to keep an appointment. What may interact with this medication? Do not take this medication with any of the following: Deferoxamine Dimercaprol Other iron products This medication may also interact with the following: Chloramphenicol Deferasirox This list may not describe all possible interactions. Give your health care provider a list of all the medicines, herbs, non-prescription drugs, or dietary  supplements you use. Also tell them if you smoke, drink alcohol, or use illegal drugs. Some items may interact with your medicine. What should I watch for while using this medication? Visit your care team regularly. Tell your care team if your symptoms do not start to get better or if they get worse. You may need blood work done while you are taking this medication. You may need to follow a special diet. Talk to your care team. Foods that contain iron include: whole grains/cereals, dried fruits, beans, or peas, leafy green vegetables, and organ meats (liver, kidney). What side effects may I notice from receiving this medication? Side effects that you should report to your care team as soon as possible: Allergic reactions--skin rash, itching, hives, swelling of the face, lips, tongue, or throat Low blood pressure--dizziness, feeling faint or lightheaded, blurry vision Shortness of breath Side effects that usually do not require medical attention (report to your care team if they continue or are bothersome): Flushing Headache Joint pain Muscle pain Nausea Pain, redness, or irritation at injection site This list may not describe all possible side effects. Call your doctor for medical advice about side effects. You may report side effects to FDA at 1-800-FDA-1088. Where should I keep my medication? This medication is given in a hospital or clinic and will not be stored at home. NOTE: This sheet is a summary. It may not cover all possible information. If you have questions about this medicine, talk to your doctor, pharmacist, or health care provider.  2024 Elsevier/Gold Standard (2021-12-30 00:00:00)     To help prevent nausea and vomiting after your treatment, we encourage you to take your nausea medication as directed.  BELOW ARE SYMPTOMS THAT SHOULD BE REPORTED IMMEDIATELY: *FEVER GREATER THAN 100.4 F (38 C) OR HIGHER *CHILLS OR SWEATING *NAUSEA AND VOMITING THAT IS NOT CONTROLLED WITH  YOUR NAUSEA MEDICATION *UNUSUAL SHORTNESS OF BREATH *UNUSUAL BRUISING OR BLEEDING *URINARY PROBLEMS (pain or burning when urinating, or frequent urination) *BOWEL PROBLEMS (unusual diarrhea, constipation, pain near the anus) TENDERNESS IN MOUTH AND THROAT WITH OR WITHOUT PRESENCE OF ULCERS (sore throat, sores in mouth, or a toothache) UNUSUAL RASH, SWELLING OR PAIN  UNUSUAL VAGINAL DISCHARGE OR ITCHING   Items with * indicate a potential emergency and should be followed up as soon as possible or go to the Emergency Department if any problems should occur.  Please show the CHEMOTHERAPY ALERT CARD or IMMUNOTHERAPY ALERT CARD at check-in to the Emergency Department and triage nurse.  Should you have questions after your visit or need to cancel or reschedule your appointment, please contact [facility] [phone]  and follow the prompts.  Office hours are 8:00 a.m. to 4:30 p.m. Monday - Friday. Please note that voicemails left after 4:00 p.m. may not be returned until the following business day.  We are closed weekends and major holidays. You have access to a nurse at all times for urgent questions. Please call the main number to the clinic [phone] and follow the prompts.  For any non-urgent questions, you may also contact your provider using MyChart. We now offer e-Visits for anyone 59 and older to request care online for non-urgent symptoms. For details visit [email or web address].   Also download the MyChart app! Go to the app store, search "MyChart", open the app, select [facility], and log in with your MyChart username and password.

## 2022-12-03 NOTE — Progress Notes (Signed)
Patient presents today for iron infusion Venofer.  Vital signs are stable with exception of blood pressure 94/57 and reports being symptomatic with dizziness and blurred vision. Patient also reports red patches to forearms bilaterally with no known cause. Rojelio Brenner PA made aware. Patient to get 500 NS bolus and blood drawn. Patient's GI specialist consulted with med change in spirolactone. Patient and family aware, verbalized understanding.    Blood pressure improved to 123/72. We will proceed with infusion per provider orders.    Patient tolerated treatment well with no complaints voiced.  Patient left ambulatory via wheelchair in stable condition with family.  Vital signs stable at discharge.  Follow up as scheduled.

## 2022-12-03 NOTE — Telephone Encounter (Signed)
Received notification from Rojelio Brenner, NP with hematology patient presented for his IV iron infusion and had a low blood pressure of 94/57 and per his girlfriend's blood pressure has been running low since his last visit with hematology.  Appears to be mildly symptomatic with dizziness and blurred vision as well he also interestingly reported a few red patches to his bilateral forearms.  Given his blood pressure is possible that he is experiencing some intravascular volume depletion related to his diuretics therefore they will be giving him a 500 cc bolus of normal saline as well as checking his hemoglobin.  I have advised them to reduce his spironolactone to 100 mg daily and continue his Lasix at 40 mg daily and carvedilol 3.125 mg twice daily for now.  If his blood pressure continues to be low we will likely reduce the frequency of his carvedilol.  I discussed with Ms. Buford Dresser to let the patient know to call our office for an appointment middle to end of next week to reassess his fluid status and blood pressure given we are reducing his diuretics.  Brooke Bonito, MSN, APRN, FNP-BC, AGACNP-BC [facility]

## 2022-12-06 ENCOUNTER — Ambulatory Visit (HOSPITAL_COMMUNITY)
Admission: RE | Admit: 2022-12-06 | Discharge: 2022-12-06 | Disposition: A | Discharge status: Home / Self Care | Payer: BC Managed Care – PPO | Source: Ambulatory Visit | Attending: Family Medicine | Admitting: Family Medicine

## 2022-12-06 DIAGNOSIS — R102 Pelvic and perineal pain: Secondary | ICD-10-CM | POA: Insufficient documentation

## 2022-12-07 ENCOUNTER — Encounter: Payer: Self-pay | Admitting: Gastroenterology

## 2022-12-07 ENCOUNTER — Observation Stay (HOSPITAL_COMMUNITY)
Admission: EM | Admit: 2022-12-07 | Discharge: 2022-12-08 | Disposition: A | Discharge status: Home / Self Care | Payer: BC Managed Care – PPO | Attending: Family Medicine | Admitting: Family Medicine

## 2022-12-07 ENCOUNTER — Other Ambulatory Visit: Payer: Self-pay

## 2022-12-07 ENCOUNTER — Encounter (HOSPITAL_COMMUNITY): Payer: Self-pay | Admitting: Emergency Medicine

## 2022-12-07 ENCOUNTER — Ambulatory Visit (INDEPENDENT_AMBULATORY_CARE_PROVIDER_SITE_OTHER): Payer: BC Managed Care – PPO | Admitting: Gastroenterology

## 2022-12-07 VITALS — BP 80/54 | HR 54 | Temp 97.1°F | Ht 66.0 in | Wt 174.7 lb

## 2022-12-07 DIAGNOSIS — E871 Hypo-osmolality and hyponatremia: Secondary | ICD-10-CM | POA: Insufficient documentation

## 2022-12-07 DIAGNOSIS — R531 Weakness: Secondary | ICD-10-CM | POA: Diagnosis not present

## 2022-12-07 DIAGNOSIS — E860 Dehydration: Secondary | ICD-10-CM | POA: Insufficient documentation

## 2022-12-07 DIAGNOSIS — I959 Hypotension, unspecified: Principal | ICD-10-CM | POA: Insufficient documentation

## 2022-12-07 DIAGNOSIS — E1165 Type 2 diabetes mellitus with hyperglycemia: Secondary | ICD-10-CM | POA: Diagnosis present

## 2022-12-07 DIAGNOSIS — K219 Gastro-esophageal reflux disease without esophagitis: Secondary | ICD-10-CM

## 2022-12-07 DIAGNOSIS — D500 Iron deficiency anemia secondary to blood loss (chronic): Secondary | ICD-10-CM | POA: Diagnosis not present

## 2022-12-07 DIAGNOSIS — D649 Anemia, unspecified: Secondary | ICD-10-CM | POA: Diagnosis present

## 2022-12-07 DIAGNOSIS — J209 Acute bronchitis, unspecified: Secondary | ICD-10-CM

## 2022-12-07 DIAGNOSIS — E878 Other disorders of electrolyte and fluid balance, not elsewhere classified: Secondary | ICD-10-CM | POA: Diagnosis not present

## 2022-12-07 DIAGNOSIS — D539 Nutritional anemia, unspecified: Secondary | ICD-10-CM | POA: Diagnosis present

## 2022-12-07 DIAGNOSIS — Z794 Long term (current) use of insulin: Secondary | ICD-10-CM | POA: Diagnosis not present

## 2022-12-07 DIAGNOSIS — K7031 Alcoholic cirrhosis of liver with ascites: Secondary | ICD-10-CM | POA: Diagnosis not present

## 2022-12-07 DIAGNOSIS — E119 Type 2 diabetes mellitus without complications: Secondary | ICD-10-CM

## 2022-12-07 DIAGNOSIS — D696 Thrombocytopenia, unspecified: Secondary | ICD-10-CM | POA: Diagnosis present

## 2022-12-07 DIAGNOSIS — I10 Essential (primary) hypertension: Secondary | ICD-10-CM | POA: Insufficient documentation

## 2022-12-07 LAB — GLUCOSE, CAPILLARY
Glucose-Capillary: 198 mg/dL — ABNORMAL HIGH (ref 70–99)
Glucose-Capillary: 205 mg/dL — ABNORMAL HIGH (ref 70–99)

## 2022-12-07 LAB — HEPATIC FUNCTION PANEL
ALT: 20 U/L (ref 0–44)
AST: 53 U/L — ABNORMAL HIGH (ref 15–41)
Albumin: 2.3 g/dL — ABNORMAL LOW (ref 3.5–5.0)
Alkaline Phosphatase: 131 U/L — ABNORMAL HIGH (ref 38–126)
Bilirubin, Direct: 0.9 mg/dL — ABNORMAL HIGH (ref 0.0–0.2)
Indirect Bilirubin: 2.7 mg/dL — ABNORMAL HIGH (ref 0.3–0.9)
Total Bilirubin: 3.6 mg/dL — ABNORMAL HIGH (ref 0.3–1.2)
Total Protein: 5.8 g/dL — ABNORMAL LOW (ref 6.5–8.1)

## 2022-12-07 LAB — BASIC METABOLIC PANEL
Anion gap: 8 (ref 5–15)
BUN: 5 mg/dL — ABNORMAL LOW (ref 6–20)
CO2: 24 mmol/L (ref 22–32)
Calcium: 8.4 mg/dL — ABNORMAL LOW (ref 8.9–10.3)
Chloride: 94 mmol/L — ABNORMAL LOW (ref 98–111)
Creatinine, Ser: 0.83 mg/dL (ref 0.61–1.24)
GFR, Estimated: >60 mL/min (ref >60)
Glucose, Bld: 196 mg/dL — ABNORMAL HIGH (ref 70–99)
Potassium: 3.9 mmol/L (ref 3.5–5.1)
Sodium: 126 mmol/L — ABNORMAL LOW (ref 135–145)

## 2022-12-07 LAB — CBC
HCT: 31.3 % — ABNORMAL LOW (ref 39.0–52.0)
Hemoglobin: 10.5 g/dL — ABNORMAL LOW (ref 13.0–17.0)
MCH: 31.9 pg (ref 26.0–34.0)
MCHC: 33.5 g/dL (ref 30.0–36.0)
MCV: 95.1 fL (ref 80.0–100.0)
Platelets: 115 10*3/uL — ABNORMAL LOW (ref 150–400)
RBC: 3.29 MIL/uL — ABNORMAL LOW (ref 4.22–5.81)
RDW: 17.5 % — ABNORMAL HIGH (ref 11.5–15.5)
WBC: 9 10*3/uL (ref 4.0–10.5)
nRBC: 0 % (ref 0.0–0.2)

## 2022-12-07 LAB — CBG MONITORING, ED: Glucose-Capillary: 191 mg/dL — ABNORMAL HIGH (ref 70–99)

## 2022-12-07 LAB — PROTIME-INR
INR: 1.9 — ABNORMAL HIGH (ref 0.8–1.2)
Prothrombin Time: 22.4 seconds — ABNORMAL HIGH (ref 11.4–15.2)

## 2022-12-07 MED ORDER — HEPARIN SODIUM (PORCINE) 5000 UNIT/ML IJ SOLN
5000.0000 [IU] | Freq: Three times a day (TID) | INTRAMUSCULAR | Status: DC
  Administered 2022-12-07 – 2022-12-08 (×2): 5000 [IU] via SUBCUTANEOUS
  Filled 2022-12-07 (×2): qty 1

## 2022-12-07 MED ORDER — PANTOPRAZOLE SODIUM 40 MG PO TBEC
40.0000 mg | DELAYED_RELEASE_TABLET | Freq: Two times a day (BID) | ORAL | Status: DC
  Administered 2022-12-07 – 2022-12-08 (×2): 40 mg via ORAL
  Filled 2022-12-07 (×2): qty 1

## 2022-12-07 MED ORDER — INSULIN ASPART 100 UNIT/ML IJ SOLN
0.0000 [IU] | Freq: Every day | INTRAMUSCULAR | Status: DC
  Administered 2022-12-07: 2 [IU] via SUBCUTANEOUS

## 2022-12-07 MED ORDER — POLYETHYLENE GLYCOL 3350 17 G PO PACK
17.0000 g | PACK | Freq: Every day | ORAL | Status: DC | PRN
Start: 2022-12-07 — End: 2022-12-07

## 2022-12-07 MED ORDER — ONDANSETRON HCL 4 MG PO TABS: 4.0000 mg | ORAL_TABLET | Freq: Four times a day (QID) | ORAL | Status: DC | PRN

## 2022-12-07 MED ORDER — ADULT MULTIVITAMIN W/MINERALS CH
1.0000 | ORAL_TABLET | Freq: Every day | ORAL | Status: DC
  Administered 2022-12-07 – 2022-12-08 (×2): 1 via ORAL
  Filled 2022-12-07 (×2): qty 1

## 2022-12-07 MED ORDER — INSULIN GLARGINE-YFGN 100 UNIT/ML ~~LOC~~ SOLN
20.0000 [IU] | Freq: Every day | SUBCUTANEOUS | Status: DC
  Administered 2022-12-07: 20 [IU] via SUBCUTANEOUS
  Filled 2022-12-07 (×2): qty 0.2

## 2022-12-07 MED ORDER — ONDANSETRON HCL 4 MG/2ML IJ SOLN
4.0000 mg | Freq: Four times a day (QID) | INTRAMUSCULAR | Status: DC | PRN
  Administered 2022-12-07: 4 mg via INTRAVENOUS
  Filled 2022-12-07: qty 2

## 2022-12-07 MED ORDER — BUSPIRONE HCL 5 MG PO TABS
5.0000 mg | ORAL_TABLET | Freq: Two times a day (BID) | ORAL | Status: DC
  Administered 2022-12-07 – 2022-12-08 (×2): 5 mg via ORAL
  Filled 2022-12-07 (×2): qty 1

## 2022-12-07 MED ORDER — ALBUMIN HUMAN 5 % IV SOLN
25.0000 g | Freq: Once | INTRAVENOUS | Status: AC
  Administered 2022-12-07: 25 g via INTRAVENOUS
  Filled 2022-12-07: qty 500

## 2022-12-07 MED ORDER — SODIUM CHLORIDE 0.9 % IV BOLUS
500.0000 mL | Freq: Once | INTRAVENOUS | Status: AC
  Administered 2022-12-07: 500 mL via INTRAVENOUS

## 2022-12-07 MED ORDER — POLYETHYLENE GLYCOL 3350 17 G PO PACK: 17.0000 g | PACK | Freq: Every day | ORAL | Status: DC | PRN

## 2022-12-07 MED ORDER — FOLIC ACID 1 MG PO TABS
1.0000 mg | ORAL_TABLET | Freq: Every day | ORAL | Status: DC
  Administered 2022-12-07 – 2022-12-08 (×2): 1 mg via ORAL
  Filled 2022-12-07 (×2): qty 1

## 2022-12-07 MED ORDER — SODIUM CHLORIDE 0.9% FLUSH: 3.0000 mL | INTRAVENOUS | Status: DC | PRN

## 2022-12-07 MED ORDER — SODIUM CHLORIDE 0.9% FLUSH
3.0000 mL | Freq: Two times a day (BID) | INTRAVENOUS | Status: DC
  Administered 2022-12-07 – 2022-12-08 (×2): 3 mL via INTRAVENOUS

## 2022-12-07 MED ORDER — THIAMINE MONONITRATE 100 MG PO TABS
100.0000 mg | ORAL_TABLET | Freq: Every day | ORAL | Status: DC
Start: 2022-12-07 — End: 2022-12-07

## 2022-12-07 MED ORDER — SODIUM CHLORIDE 0.9 % IV SOLN: INTRAVENOUS | Status: DC | PRN

## 2022-12-07 MED ORDER — FLUOXETINE HCL 10 MG PO CAPS: 10.0000 mg | ORAL_CAPSULE | ORAL | Status: DC

## 2022-12-07 MED ORDER — BISACODYL 10 MG RE SUPP: 10.0000 mg | Freq: Every day | RECTAL | Status: DC | PRN

## 2022-12-07 MED ORDER — LACTULOSE 10 GM/15ML PO SOLN
30.0000 g | Freq: Once | ORAL | Status: AC
  Administered 2022-12-07: 30 g via ORAL
  Filled 2022-12-07: qty 60

## 2022-12-07 MED ORDER — MIDODRINE HCL 5 MG PO TABS
5.0000 mg | ORAL_TABLET | Freq: Three times a day (TID) | ORAL | Status: DC
  Administered 2022-12-07 – 2022-12-08 (×3): 5 mg via ORAL
  Filled 2022-12-07 (×4): qty 1

## 2022-12-07 MED ORDER — INSULIN GLARGINE-YFGN 100 UNIT/ML ~~LOC~~ SOLN
35.0000 [IU] | Freq: Every day | SUBCUTANEOUS | Status: DC
  Filled 2022-12-07: qty 0.35

## 2022-12-07 MED ORDER — FLUOXETINE HCL 20 MG PO CAPS
20.0000 mg | ORAL_CAPSULE | Freq: Two times a day (BID) | ORAL | Status: DC
  Administered 2022-12-07 – 2022-12-08 (×2): 20 mg via ORAL
  Filled 2022-12-07 (×2): qty 1

## 2022-12-07 MED ORDER — ACETAMINOPHEN 325 MG PO TABS: 650.0000 mg | ORAL_TABLET | Freq: Four times a day (QID) | ORAL | Status: DC | PRN

## 2022-12-07 MED ORDER — GABAPENTIN 100 MG PO CAPS
200.0000 mg | ORAL_CAPSULE | Freq: Two times a day (BID) | ORAL | Status: DC
  Administered 2022-12-07 – 2022-12-08 (×2): 200 mg via ORAL
  Filled 2022-12-07 (×2): qty 2

## 2022-12-07 MED ORDER — FERROUS SULFATE 325 (65 FE) MG PO TABS
324.0000 mg | ORAL_TABLET | Freq: Every day | ORAL | Status: DC
  Administered 2022-12-08: 324 mg via ORAL
  Filled 2022-12-07: qty 1

## 2022-12-07 MED ORDER — SODIUM CHLORIDE 0.9% FLUSH
3.0000 mL | Freq: Two times a day (BID) | INTRAVENOUS | Status: DC
  Administered 2022-12-08: 3 mL via INTRAVENOUS

## 2022-12-07 MED ORDER — ACETAMINOPHEN 650 MG RE SUPP: 650.0000 mg | Freq: Four times a day (QID) | RECTAL | Status: DC | PRN

## 2022-12-07 MED ORDER — ADULT MULTIVITAMIN W/MINERALS CH: 1.0000 | ORAL_TABLET | Freq: Every day | ORAL | Status: DC

## 2022-12-07 MED ORDER — FOLIC ACID 1 MG PO TABS: 1.0000 mg | ORAL_TABLET | Freq: Every day | ORAL | Status: DC

## 2022-12-07 MED ORDER — THIAMINE MONONITRATE 100 MG PO TABS
100.0000 mg | ORAL_TABLET | Freq: Every day | ORAL | Status: DC
  Administered 2022-12-07 – 2022-12-08 (×2): 100 mg via ORAL
  Filled 2022-12-07 (×2): qty 1

## 2022-12-07 MED ORDER — ALBUMIN HUMAN 25 % IV SOLN: 12.5000 g | Freq: Once | INTRAVENOUS | Status: DC

## 2022-12-07 MED ORDER — INSULIN ASPART 100 UNIT/ML IJ SOLN
0.0000 [IU] | Freq: Three times a day (TID) | INTRAMUSCULAR | Status: DC
  Administered 2022-12-07 – 2022-12-08 (×3): 1 [IU] via SUBCUTANEOUS

## 2022-12-07 MED ORDER — SODIUM CHLORIDE 0.9 % IV BOLUS
1000.0000 mL | Freq: Once | INTRAVENOUS | Status: AC
  Administered 2022-12-07: 1000 mL via INTRAVENOUS

## 2022-12-07 MED ORDER — RIFAXIMIN 550 MG PO TABS
550.0000 mg | ORAL_TABLET | Freq: Two times a day (BID) | ORAL | Status: DC
  Administered 2022-12-07 – 2022-12-08 (×2): 550 mg via ORAL
  Filled 2022-12-07 (×2): qty 1

## 2022-12-07 NOTE — Progress Notes (Signed)
GI Office Note    Referring Provider: [name], MD Primary Care Physician:  Shawn Stabile, MD Primary Gastroenterologist: Shawn Velazquez. Shawn Lor, DO  Date:  12/07/2022  ID:  Shawn Velazquez, DOB 11/27/1965, MRN [id number]   Chief Complaint   Chief Complaint  Patient presents with   Follow-up    Patient here today for a follow up on IDA. Patient had Venofer infusion last Friday at the cancer center. Patient says he has dizziness, shortness of breath and fatigue. Patient is hypotensive today with a bp of 81/47, which could contribute to the dizziness. 12/03/2022 hgb 10.6 Hct 29.0. Patient also has history of Hepatic cirrhosis taking xifaxan 550 mg bid. Patient denies any swelling in abd. Had US done yesterday at [facility]. I do not see it is resulted.    History of Present Illness  Shawn Velazquez is a 57 y.o. male with a history of  alcoholic liver cirrhosis, alcohol abuse, sleep apnea, diabetes, HTN presenting today with complaint of vomiting and fear of variceal bands coming off presenting today secondary to hypotension and dizziness.   MRI liver with and without contrast 08/03/2022: -Chronic liver disease -Macronodular liver with multiple presumed regenerative nodules -No clear aggressive appearing lesions with restricted diffusion -Mild ascites -Varices on the stomach and lower esophagus -Mild splenic enlargement   Hospitalized at [facility] 08/26/2022 through 08/29/2022: Presented with hematemesis and melena for 3 days.  Noted to have previous admission at the end of January for anasarca.  He underwent EGD for hematemesis and melena during this hospitalization in February.  Procedure outlined below.  He was treated with octreotide and PPI infusion as well as IV Rocephin for SBP prophylaxis.  He was also treated with Carafate 1 g 4 times daily.  His MELD was 27 on admission with a DF of 45.9.  Was treated with prednisolone during his prior hospitalization in January.  Patient has reported  intermittent drowsiness and confusion concerning for possible hepatic encephalopathy but has been maintained on lactulose.  Given significant bowel movements while inpatient his lactulose was decreased from 3 times daily to twice daily.  Given he was still having alcohol use prednisolone was not started.  Prior to discharge he was advised to continue Lasix 20 mg daily 100 mg spironolactone daily.  His MELD 3.0 was 22 on discharge.  Per Dr. Levon Hedger, no need for Xifaxan at this time.  Alcohol cessation encouraged. Recommendations on discharge were to continue Carafate for 2 weeks, PPI twice daily, Lasix 40 mg daily, spironolactone 100 mg daily, lactulose 2-3 bowel movements daily, carvedilol 3.125 mg twice daily.  Need to arrange outpatient evaluation at transplant center given elevated levels.   EGD 08/26/2022: -grade 2 esophageal varices s/p banding x 3 with complete eradication, without bleeding -Single nonbleeding angiodysplastic lesion in the duodenum.  -Advised Carafate suspension 1 g before meals and nightly -Repeat EGD in 4-8 weeks.   Recent hospitalization 09/24/22-09/28/22.  He presented to the ED with hematemesis, having multiple episodes.  Reportedly continued to drink alcohol.  Reports some mild tremors and intermittent confusion but no worse than previous.  Complaining of lower extremity edema and abdominal distention.  History of Rocephin for SBP prophylaxis.  Underwent EGD with Dr. Jena Gauss.  IV twice daily and octreotide infusion.  Continued on lactulose as well as daily diuretic regimen of 40 mg of Lasix and 100 mg of spironolactone.  EGD as outlined below.  Was given vitamin K x 1 dose. Discharged on lasix 40  mg daily and spironolactone 150mg  daily (due to hypokalemia).  Recommended restarting Coreg 3.125 mg twice daily.   EGD 09/24/22: -4 columns of grade 2-3 esophageal varices, cherry red spots 1 on each of 2 columns without active bleeding -Portal hypertensive gastropathy with touch  friability, no gastric varices -Microvasive 7 shot Bander patch -1 band placed on each column with good hemostasis -Advise repeat banding in 4 weeks. -Continue PPI   Office visit 10/14/22. Advised to continue current medications including Lasix 40 mg daily, spironolactone 150 mg daily, carvedilol 3.125 twice daily, lactulose, and PPI twice daily.  Start on Xifaxan 550 mg twice daily.  Advised he may use Gas-X as needed for bloating.  Due for liver imaging in July 2024.  Advised to proceed with EGD that was already scheduled. EGD cancelled given patient lost insurance 10/14/22.    Last office visit 11/04/22. Increased N/V. Swelling fairly well controlled. Abdomen tight at times. Eating very little. Poor appetite. Was unsure if taking Xifaxin. 1-2 BM daily. Lactulose twice daily. Insurance reinstated. Advised labs and reschedule EGD. Start Xifaxin. Continue lasix, spironolactone (150) carvedilol 3.125 mg BID. Low sodium diet. Start ensures and follow up with [facility] regarding possible transplant candidacy.   Contacted by heme/onc PA 5/31 regarding patient with dizziness and shortness of breath who presented for iron infusion. He was given 500 cc bolus of NS and CBC checked. Hgb 10.6, plts 174. Last ferritin was 19 (11/11/22).  I was asked for recommendations for treatment and recommended decreasing spironolactone from 150mg  to 100 mg daily given BP was a little soft and asked to follow up in the office. His BP improved to 123/72 after fluids.Tolerated infusion without issue.   Today:  BP has been fine all weekend. Did not go to sleep until about 4AM this morning. Has only been up since about 5 minutes til 7. Has not taken meds this morning. Has only had some water this morning. Not feeling dizzy right now. Feels very sleepy currently. Swelling has been controlled. No abdominal. Has been tacking Xifaxin. Nausea has been well controlled.   BP has been >90/50. Girlfriend reports that over the last couple  of weeks his BP has been on the lower side and has steadily been decreasing. He overall has felt better than usual. No worsening confusion. Feeling thirsty today.   Appetite has improved. Has been tacking spironolactone 100 mg daily as recommended. Has been short of breath - but nothing worsened form his baseline. No fevers or chills.    Current Outpatient Medications  Medication Sig Dispense Refill   albuterol (VENTOLIN HFA) 108 (90 Base) MCG/ACT inhaler Inhale 2 puffs into the lungs every 6 (six) hours as needed for wheezing or shortness of breath. 20.1 g 0   busPIRone (BUSPAR) 5 MG tablet Take 1 tablet by mouth daily as needed.     carvedilol (COREG) 3.125 MG tablet Take 1 tablet (3.125 mg total) by mouth 2 (two) times daily with a meal. 60 tablet 2   Continuous Blood Gluc Sensor (DEXCOM G7 SENSOR) MISC Inject 1 application. into the skin as directed. Change sensor every 10 days as directed. 6 each 3   ferrous sulfate 324 MG TBEC Take 324 mg by mouth daily with breakfast.     FLUoxetine (PROZAC) 10 MG capsule TAKE 4 CAPSULES BY MOUTH EVERY DAY (Patient taking differently: Take 10 mg by mouth See admin instructions. TAKE 2 CAPSULES IN THE MORNING AND 2 CAPSULES EVERY EVENING.) 260 capsule 0   folic  acid (FOLVITE) 1 MG tablet TAKE 1 TABLET BY MOUTH DAILY 30 tablet 0   furosemide (LASIX) 40 MG tablet Take 1 tablet (40 mg total) by mouth daily. 30 tablet 2   gabapentin (NEURONTIN) 100 MG capsule Take 1 tablet by mouth daily.     glucose blood test strip Use as instructed to monitor glucose 4 times daily 100 each 12   insulin glargine (LANTUS SOLOSTAR) 100 UNIT/ML Solostar Pen Inject 35 Units into the skin at bedtime. 30 mL 3   insulin lispro (HUMALOG KWIKPEN) 100 UNIT/ML KwikPen Inject 10-16 Units into the skin 3 (three) times daily. 40 mL 3   Insulin Pen Needle (PEN NEEDLES) 32G X 4 MM MISC 1 each by Does not apply route in the morning, at noon, in the evening, and at bedtime. Use to inject  insulin 4 times daily 200 each 3   levocetirizine (XYZAL) 5 MG tablet Take 1 tablet by mouth daily.     Multiple Vitamin (MULTIVITAMIN) tablet Take 1 tablet by mouth daily.     ondansetron (ZOFRAN-ODT) 4 MG disintegrating tablet Take 1 tablet (4 mg total) by mouth every 8 (eight) hours as needed for nausea or vomiting. 30 tablet 1   pantoprazole (PROTONIX) 40 MG tablet Take 1 tablet (40 mg total) by mouth 2 (two) times daily. 60 tablet 5   polyethylene glycol (MIRALAX / GLYCOLAX) 17 g packet Take 17 g by mouth daily as needed for mild constipation. 14 each 0   rifaximin (XIFAXAN) 550 MG TABS tablet Take 1 tablet (550 mg total) by mouth 2 (two) times daily. 60 tablet 2   spironolactone (ALDACTONE) 50 MG tablet Take 3 tablets (150 mg total) by mouth daily. 90 tablet 3   thiamine (VITAMIN B-1) 100 MG tablet Take 1 tablet (100 mg total) by mouth daily. 30 tablet 1   sucralfate (CARAFATE) 1 GM/10ML suspension Take 10 mLs (1 g total) by mouth 4 (four) times daily -  with meals and at bedtime for 14 days. (Patient not taking: Reported on 12/07/2022) 560 mL 0   No current facility-administered medications for this visit.    Past Medical History:  Diagnosis Date   Anxiety    Arthritis    affects hands, shoulder, neck, knees, hips, ankles, toes   Chronic back pain    Diabetes mellitus    diet controlled   Eczema    Fatty liver, alcoholic    GERD (gastroesophageal reflux disease)    Glaucoma    slight case   Hypertension    Iron deficiency anemia due to chronic blood loss 11/25/2022   Psoriasis    Sleep apnea    Substance abuse (HCC)    Vertigo 09/11/2017   Wears glasses     Past Surgical History:  Procedure Laterality Date   COLONOSCOPY WITH PROPOFOL N/A 04/05/2022   Procedure: COLONOSCOPY WITH PROPOFOL;  Surgeon: Lanelle Bal, DO;  Location: [facility];  Service: Endoscopy;  Laterality: N/A;  10:45 AM,unable to reach pt to move up   ESOPHAGEAL BANDING N/A 08/26/2022   Procedure:  ESOPHAGEAL BANDING;  Surgeon: Lanelle Bal, DO;  Location: [facility];  Service: Endoscopy;  Laterality: N/A;   ESOPHAGEAL BANDING  09/24/2022   Procedure: ESOPHAGEAL BANDING;  Surgeon: Corbin Ade, MD;  Location: [facility];  Service: Endoscopy;;   ESOPHAGOGASTRODUODENOSCOPY (EGD) WITH PROPOFOL N/A 08/26/2022   Procedure: ESOPHAGOGASTRODUODENOSCOPY (EGD) WITH PROPOFOL;  Surgeon: Lanelle Bal, DO;  Location: [facility];  Service:  Endoscopy;  Laterality: N/A;   ESOPHAGOGASTRODUODENOSCOPY (EGD) WITH PROPOFOL N/A 09/24/2022   Procedure: ESOPHAGOGASTRODUODENOSCOPY (EGD) WITH PROPOFOL;  Surgeon: Corbin Ade, MD;  Location: [facility];  Service: Endoscopy;  Laterality: N/A;  hematemesis, esophageal varices, melena   MULTIPLE TOOTH EXTRACTIONS     POLYPECTOMY  04/05/2022   Procedure: POLYPECTOMY;  Surgeon: Lanelle Bal, DO;  Location: [facility];  Service: Endoscopy;;   POSTERIOR CERVICAL LAMINECTOMY  04/21/2012   Procedure: POSTERIOR CERVICAL LAMINECTOMY;  Surgeon: Mariam Dollar, MD;  Location: [facility];  Service: Neurosurgery;  Laterality: Left;  Posterior Cervical laminectomy/foraminotomy and diskectomy, left cervical seven-thoracic one   ROTATOR CUFF REPAIR     THORACIC DISCECTOMY Left 04/03/2014   Procedure: Left Thoracic Seven to Eight, Thoracic Eight to Nine Thoracic Discectomy ;  Surgeon: Mariam Dollar, MD;  Location: [facility];  Service: Neurosurgery;  Laterality: Left;    Family History  Problem Relation Age of Onset   Diabetes Father    Cancer - Lung Father    Hypertension Other    Cancer - Lung Other     Allergies as of 12/07/2022 - Review Complete 12/07/2022  Allergen Reaction Noted   Neosporin + pain relief max st [neomy-bacit-polymyx-pramoxine] Rash 04/19/2012    Social History   Socioeconomic History   Marital status: Divorced    Spouse name: Not on file   Number of children: Not on file   Years of education: Not on file   Highest  education level: Not on file  Occupational History    Comment: Truck to storage  Tobacco Use   Smoking status: Every Day    Packs/day: 0.25    Years: 40.00    Additional pack years: 0.00    Total pack years: 10.00    Types: Cigarettes    Last attempt to quit: 05/09/2022    Years since quitting: 0.5   Smokeless tobacco: Never   Tobacco comments:    pt trying to quit on his own  Vaping Use   Vaping Use: Former  Substance and Sexual Activity   Alcohol use: Not Currently    Alcohol/week: 12.0 standard drinks of alcohol    Types: 12 Cans of beer per week    Comment: Either Smirnoff Smash-4 per day or Fireball-10 airplane bottles in a day   Drug use: No   Sexual activity: Yes    Birth control/protection: None  Other Topics Concern   Not on file  Social History Narrative   Not on file   Social Determinants of Health   Financial Resource Strain: Medium Risk (10/07/2022)   Overall Financial Resource Strain (CARDIA)    Difficulty of Paying Living Expenses: Somewhat hard  Food Insecurity: No Food Insecurity (10/07/2022)   Hunger Vital Sign    Worried About Running Out of Food in the Last Year: Never true    Ran Out of Food in the Last Year: Never true  Transportation Needs: No Transportation Needs (10/07/2022)   PRAPARE - Administrator, Civil Service (Medical): No    Lack of Transportation (Non-Medical): No  Physical Activity: Not on file  Stress: Stress Concern Present (10/07/2022)   [employer] of Occupational Health - Occupational Stress Questionnaire    Feeling of Stress : To some extent  Social Connections: Not on file     Review of Systems   Gen: Denies fever, chills, anorexia. Denies fatigue, weakness, weight loss.  CV: Denies chest pain, palpitations, syncope, peripheral edema,  and claudication. Resp: Denies dyspnea at rest, cough, wheezing, coughing up blood, and pleurisy. GI: See HPI Derm: Denies rash, itching, dry skin Psych: Denies depression,  anxiety, memory loss, confusion. No homicidal or suicidal ideation.  Heme: Denies bruising, bleeding, and enlarged lymph nodes.   Physical Exam   BP (!) 77/47 (BP Location: Right Arm, Patient Position: Sitting, Cuff Size: Large)   Pulse (!) 54   Temp (!) 97.1 F (36.2 C) (Temporal)   Ht 5\' 6"  (1.676 m)   Wt 174 lb 11.2 oz (79.2 kg)   BMI 28.20 kg/m   General:   Alert and oriented. No distress noted. Pleasant and cooperative.  Head:  Normocephalic and atraumatic. Eyes:  Conjuctiva clear without scleral icterus. Mouth:  Oral mucosa pink and moist. Good dentition. No lesions. Lungs: Inspiratory and expiratory wheezing bilaterally.  Heart:  S1, S2 present without murmurs appreciated.  Abdomen:  +BS, soft, non-distended. Mild ttp to periumbilical region and epigastrium. No rebound or guarding. No HSM or masses noted. Rectal: deferred Msk:  Symmetrical without gross deformities. Normal posture. Extremities:  Without edema. Neurologic:  Alert and  oriented x4 Psych:  Alert and cooperative. Normal mood and affect.   Assessment  ROBROY CHEVEZ is a 57 y.o. male with a history of alcoholic liver cirrhosis, alcohol abuse, sleep apnea, diabetes, HTN presenting today with complaint of vomiting and fear of variceal bands coming off presenting today secondary to hypotension and dizziness.   Decompensated Alcohol Cirrhosis: Secondary to alcohol use.  Course complicated by esophageal variceal bleeding s/p banding.  Is in need of surveillance EGD which has not yet been scheduled.  Appetite has significantly improved.  It appears that he is still losing weight since his last visit however unclear if this is from diuresis.  Hypotensive today. Also hypotensive 5/31 and received 500 cc NS with improvement in BP. No edema on exam today. No evidence of ascites but he does have tenderness on exam without any recent overt abdominal pain. Confusion at baseline. Having 2-3 BM daily on lactulose and Xifaxin.  Given his hypotension I have advised fluids in the ED and will hold diuretics for at least 48 hours and for now hold carvedilol. Will need to assess kidney function and CBC as well. He did have some mild leukocytosis on 5/31. Denies fevers/chills. Would like to rule out infection and hepatorenal syndrome as cause of hypotension. Most likely secondary to over diuresis or carvedilol but diuresis more likely given his improvement with fluids previously.   Anemia: Following with hematology for IV iron infusions with last infusion.   GERD: Controlled with PPI. No nausea. Appetite has significantly improved.   PLAN   Stop carvedilol Hold diuretics for the next 48 hours - monitor BP and notify office prior to restarting. May need to reduce diuretics further.  Continue PPI and lactulose Contrinue thiamine and folic acid.  Continue Xifaxin.  Go to ED for IV hydration/albumin as well as labs (CBC, CMP, INR) Contact [facility] for an appointment for transplant evaluation as previously recommended 2g sodium diet Continued alcohol cessation Follow-up in 3 weeks   Brooke Bonito, MSN, FNP-BC, AGACNP-BC [facility]

## 2022-12-07 NOTE — Telephone Encounter (Signed)
noted 

## 2022-12-07 NOTE — H&P (Signed)
Patient Demographics:    Shawn Velazquez, is a 57 y.o. male  MRN: [id number]   DOB - 02-18-1966  Admit Date - 12/07/2022  Outpatient Primary MD for the patient is [name], MD   Assessment & Plan:   Assessment and Plan:  1) dehydration and hypotension in the setting of antihypertensives and diuretic use in the cirrhotic patient--- -Blood pressure 77/47 in the ED after initial intervention systolic blood pressure was up to the 80s -Patient was bradycardic -Hold PTA Aldactone, Lasix and Coreg -Give IV fluids and IV albumin -Add midodrine  2) alcoholic liver cirrhosis--with prior variceal bleed -Patient is in process of being referred to [facility] transplant team for evaluation -Continue Protonix and lactulose -Continue multivitamin folic acid and thiamine  -Continue rifaximin -No significant ascites at this time  3) depression and anxiety--stable, continue buspirone and Prozac  4) chronic iron deficiency anemia--- prior history of esophageal variceal bleeding, status post prior esophageal variceal banding Hgb is 10.5 which is at a higher than recent baseline -Patient gets iron infusion from time to time -No evidence of ongoing active bleeding at this time  5) tobacco abuse--- smoking cessation advised  6) hyponatremia/hypochloremia-----Sodium is low at 126 chloride 94  -This is due to diuretic use -Aldactone and Lasix currently on hold as above #1  7)DM2--  -Lantus 20 units nightly Use Novolog/Humalog Sliding scale insulin with Accu-Cheks/Fingersticks as ordered   Dispo: The patient is from: Home              Anticipated d/c is to: Home              Anticipated d/c date is: 1 day              Patient currently is not medically stable to d/c. Barriers: Not Clinically Stable-   With History of  - Reviewed by me  Past Medical History:  Diagnosis Date   Anxiety    Arthritis    affects hands, shoulder, neck, knees, hips, ankles, toes   Chronic back pain    Diabetes mellitus    diet controlled   Eczema    Fatty liver, alcoholic    GERD (gastroesophageal reflux disease)    Glaucoma    slight case   Hypertension    Iron deficiency anemia due to chronic blood loss 11/25/2022   Psoriasis    Sleep apnea    Substance abuse (HCC)    Vertigo 09/11/2017   Wears glasses       Past Surgical History:  Procedure Laterality Date   COLONOSCOPY WITH PROPOFOL N/A 04/05/2022   Procedure: COLONOSCOPY WITH PROPOFOL;  Surgeon: Lanelle Bal, DO;  [facility];  Service: Endoscopy;  Laterality: N/A;  10:45 AM,unable to reach pt to move up   ESOPHAGEAL BANDING N/A 08/26/2022   Procedure: ESOPHAGEAL BANDING;  Surgeon: Lanelle Bal, DO;  [facility]: [facility];  Service: Endoscopy;  Laterality: N/A;  ESOPHAGEAL BANDING  09/24/2022   Procedure: ESOPHAGEAL BANDING;  Surgeon: Corbin Ade, MD;  [facility]: [facility];  Service: Endoscopy;;   ESOPHAGOGASTRODUODENOSCOPY (EGD) WITH PROPOFOL N/A 08/26/2022   Procedure: ESOPHAGOGASTRODUODENOSCOPY (EGD) WITH PROPOFOL;  Surgeon: Lanelle Bal, DO;  [facility];  Service: Endoscopy;  Laterality: N/A;   ESOPHAGOGASTRODUODENOSCOPY (EGD) WITH PROPOFOL N/A 09/24/2022   Procedure: ESOPHAGOGASTRODUODENOSCOPY (EGD) WITH PROPOFOL;  Surgeon: Corbin Ade, MD;  [facility];  Service: Endoscopy;  Laterality: N/A;  hematemesis, esophageal varices, melena   MULTIPLE TOOTH EXTRACTIONS     POLYPECTOMY  04/05/2022   Procedure: POLYPECTOMY;  Surgeon: Lanelle Bal, DO;  [facility]: [facility];  Service: Endoscopy;;   POSTERIOR CERVICAL LAMINECTOMY  04/21/2012   Procedure: POSTERIOR CERVICAL LAMINECTOMY;  Surgeon: Mariam Dollar, MD;  Location: [facility];  Service: Neurosurgery;  Laterality: Left;  Posterior Cervical  laminectomy/foraminotomy and diskectomy, left cervical seven-thoracic one   ROTATOR CUFF REPAIR     THORACIC DISCECTOMY Left 04/03/2014   Procedure: Left Thoracic Seven to Eight, Thoracic Eight to Nine Thoracic Discectomy ;  Surgeon: Mariam Dollar, MD;  Location: [facility];  Service: Neurosurgery;  Laterality: Left;     Chief Complaint  Patient presents with   Weakness      HPI:    Shawn Velazquez  is a 57 y.o. male with pmhx relevant for ongoing tobacco abuse, depression anxiety, DM2, alcoholic liver cirrhosis with chronic anemia and thrombocytopenia, history of GI bleed in the setting of esophageal varices requiring banding in the past with iron deficiency anemia presents from GI clinic on 12/07/2022 with concerns for dehydration and hypotension in the setting of diuretic use and antihypertensives No fever  Or chills  -No chest pains no palpitations -He does have orthostatic dizziness -No URI or UTI symptoms -Additional history obtained from GI team -office progress note today from Ms. Brooke Bonito reviewed -ED patient was hypotensive required IV fluids and IV albumin -AST 53 ALT 20 alk phos 131 T. bili 3.6 indirect bili of 2.7 with a direct bili of 0.9, albumin is only 2.3 -Sodium is low at 126 chloride 94 potassium 3.9, creatinine 0.83 -INR is 1.9   Review of systems:    In addition to the HPI above,   A full Review of  Systems was done, all other systems reviewed are negative except as noted above in HPI , .    Social History:  Reviewed by me    Social History   Tobacco Use   Smoking status: Every Day    Packs/day: 0.25    Years: 40.00    Additional pack years: 0.00    Total pack years: 10.00    Types: Cigarettes    Last attempt to quit: 05/09/2022    Years since quitting: 0.5   Smokeless tobacco: Never   Tobacco comments:    pt trying to quit on his own  Substance Use Topics   Alcohol use: Not Currently    Alcohol/week: 12.0 standard drinks of alcohol     Types: 12 Cans of beer per week    Comment: last intake of any alcohol in May 2024 /Either Smirnoff Smash-4 per day or Fireball-10 airplane bottles in a day     Family History :  Reviewed by me    Family History  Problem Relation Age of Onset   Diabetes Father    Cancer - Lung Father    Hypertension Other    Cancer -  Lung Other     Home Medications:   Prior to Admission medications   Medication Sig Start Date End Date Taking? Authorizing Provider  albuterol (VENTOLIN HFA) 108 (90 Base) MCG/ACT inhaler Inhale 2 puffs into the lungs every 6 (six) hours as needed for wheezing or shortness of breath. 09/07/22  Yes Mahon, Courtney L, NP  busPIRone (BUSPAR) 5 MG tablet Take 1 tablet by mouth daily as needed. 10/05/22  Yes [provider]  carvedilol (COREG) 3.125 MG tablet Take 1 tablet (3.125 mg total) by mouth 2 (two) times daily with a meal. 09/28/22 12/27/22 Yes Shahmehdi, Seyed A, MD  Continuous Blood Gluc Sensor (DEXCOM G7 SENSOR) MISC Inject 1 application. into the skin as directed. Change sensor every 10 days as directed. 12/09/21  Yes Dani Gobble, NP  ferrous sulfate 324 MG TBEC Take 324 mg by mouth daily with breakfast.   Yes [provider]  FLUoxetine (PROZAC) 10 MG capsule TAKE 4 CAPSULES BY MOUTH EVERY DAY Patient taking differently: Take 10 mg by mouth See admin instructions. TAKE 2 CAPSULES IN THE MORNING AND 2 CAPSULES EVERY EVENING. 02/17/22  Yes Paseda, Baird Kay, FNP  folic acid (FOLVITE) 1 MG tablet TAKE 1 TABLET BY MOUTH DAILY 12/02/22  Yes Mahon, Courtney L, NP  furosemide (LASIX) 40 MG tablet Take 1 tablet (40 mg total) by mouth daily. 09/28/22 12/27/22 Yes Shahmehdi, Seyed A, MD  gabapentin (NEURONTIN) 100 MG capsule Take 1 tablet by mouth daily. 10/05/22  Yes [provider]  insulin glargine (LANTUS SOLOSTAR) 100 UNIT/ML Solostar Pen Inject 35 Units into the skin at bedtime. 09/09/22  Yes Reardon, Freddi Starr, NP  insulin lispro (HUMALOG KWIKPEN)  100 UNIT/ML KwikPen Inject 10-16 Units into the skin 3 (three) times daily. 09/09/22  Yes Reardon, Freddi Starr, NP  levocetirizine (XYZAL) 5 MG tablet Take 1 tablet by mouth daily. 10/05/22  Yes [provider]  Multiple Vitamin (MULTIVITAMIN) tablet Take 1 tablet by mouth daily.   Yes [provider]  ondansetron (ZOFRAN-ODT) 4 MG disintegrating tablet Take 1 tablet (4 mg total) by mouth every 8 (eight) hours as needed for nausea or vomiting. 11/04/22  Yes Mahon, Frederik Schmidt, NP  pantoprazole (PROTONIX) 40 MG tablet Take 1 tablet (40 mg total) by mouth 2 (two) times daily. 11/04/22 12/07/22 Yes Mahon, Frederik Schmidt, NP  polyethylene glycol (MIRALAX / GLYCOLAX) 17 g packet Take 17 g by mouth daily as needed for mild constipation. 09/28/22  Yes Shahmehdi, Gemma Payor, MD  rifaximin (XIFAXAN) 550 MG TABS tablet Take 1 tablet (550 mg total) by mouth 2 (two) times daily. 10/14/22  Yes Mahon, Frederik Schmidt, NP  spironolactone (ALDACTONE) 50 MG tablet Take 3 tablets (150 mg total) by mouth daily. 09/29/22 01/27/23 Yes Shahmehdi, Gemma Payor, MD  thiamine (VITAMIN B-1) 100 MG tablet Take 1 tablet (100 mg total) by mouth daily. 08/06/22  Yes Johnson, Clanford L, MD  glucose blood test strip Use as instructed to monitor glucose 4 times daily 12/09/21   Dani Gobble, NP  Insulin Pen Needle (PEN NEEDLES) 32G X 4 MM MISC 1 each by Does not apply route in the morning, at noon, in the evening, and at bedtime. Use to inject insulin 4 times daily 05/12/22   Dani Gobble, NP     Allergies:    Allergies  Allergen Reactions   Neosporin + Pain Relief Max St [Neomy-Bacit-Polymyx-Pramoxine] Rash    Physical Exam:   Vitals  Blood pressure (!) 108/52, pulse Marland Kitchen)  50, temperature 98 F (36.7 C), temperature source Oral, resp. rate 18, height 5\' 6"  (1.676 m), weight 80.5 kg, SpO2 100 %.  Physical Examination: General appearance - alert,  in no distress  Mental status - alert, oriented to person, place, and time,  Eyes -  sclera anicteric Neck - supple, no JVD elevation , Chest - clear  to auscultation bilaterally, symmetrical air movement,  Heart - S1 and S2 normal, regular  Abdomen - soft, nontender, nondistended, +BS, no significant ascites Neurological - screening mental status exam normal, neck supple without rigidity, cranial nerves II through XII intact, DTR's normal and symmetric Extremities - no pedal edema noted, intact peripheral pulses  Skin - warm, very dry   Data Review:    CBC Recent Labs  Lab 12/03/22 1104 12/07/22 0920  WBC 11.7* 9.0  HGB 10.6* 10.5*  HCT 29.0* 31.3*  PLT 174 115*  MCV 92.9 95.1  MCH 34.0 31.9  MCHC 36.6* 33.5  RDW 16.5* 17.5*   ----------------------------------------------------------------------------------------------------------------- Chemistries  Recent Labs  Lab 12/07/22 0920 12/07/22 1041  NA 126*  --   K 3.9  --   CL 94*  --   CO2 24  --   GLUCOSE 196*  --   BUN 5*  --   CREATININE 0.83  --   CALCIUM 8.4*  --   AST  --  53*  ALT  --  20  ALKPHOS  --  131*  BILITOT  --  3.6*   ------------------------------------------------------------------------------------------------------------------ estimated creatinine clearance is 96.7 mL/min (by C-G formula based on SCr of 0.83 mg/dL). ------------------------------------------------------------------------------------------------------------------  Coagulation profile Recent Labs  Lab 12/07/22 1041  INR 1.9*   ------------------------------------------------------------------------------------------------------------------ ------------------------------------------------------------------------------------------------------------------    Component Value Date/Time   BNP 96.0 08/02/2022 1224   Urinalysis    Component Value Date/Time   COLORURINE YELLOW 09/27/2022 0515   APPEARANCEUR CLEAR 09/27/2022 0515   LABSPEC 1.014 09/27/2022 0515   PHURINE 7.0 09/27/2022 0515   GLUCOSEU  NEGATIVE 09/27/2022 0515   HGBUR SMALL (A) 09/27/2022 0515   BILIRUBINUR NEGATIVE 09/27/2022 0515   BILIRUBINUR negative 07/01/2020 1608   KETONESUR NEGATIVE 09/27/2022 0515   PROTEINUR NEGATIVE 09/27/2022 0515   UROBILINOGEN 0.2 07/01/2020 1608   NITRITE NEGATIVE 09/27/2022 0515   LEUKOCYTESUR NEGATIVE 09/27/2022 0515    Imaging Results:    Radiological Exams on Admission: No results found.  DVT Prophylaxis -SCD/heparin subcu AM Labs Ordered, also please review Full Orders  Family Communication: Admission, patients condition and plan of care including tests being ordered have been discussed with the patient who indicate understanding and agree with the plan   Condition  -stable  Shon Hale M.D on 12/07/2022 at 6:42 PM Go to [email or web address] -  for contact info  [facility] - Office  [phone]

## 2022-12-07 NOTE — Telephone Encounter (Signed)
Patient seen in ED by Dr. Estell Harpin at your request.  He ambulated with no acute symptoms ;systolic pressure in the 100ish.  Labs without any New concerning findings.  Patient given 1500 cc of fluids and  Albumin 25 g.  Continuing your recommendations of no beta-blocker and no diuretic for least 48 hours.  It was reemphasized to the patient he was to call on 6/6 to get further instructions regarding further diuretic therapy.

## 2022-12-07 NOTE — Patient Instructions (Addendum)
Oncology unfortunately does not have any spots to be able to get you in for fluids today and Ms. Buford Dresser herself recommended going to the ER so that way we could get the blood work as well as some albumin.  I want you to hold your Lasix and spironolactone for the next 2 days and call with blood pressures before deciding about resuming them.  I also want you to hold your carvedilol (Coreg).  Please do not begin taking this until advised to resume.   Please continue your Xifaxan, lactulose, folic acid, and thiamine.   I would like to see you back in the office in 3 weeks.  It was a pleasure to see you today. I want to create trusting relationships with patients. If you receive a survey regarding your visit,  I greatly appreciate you taking time to fill this out on paper or through your MyChart. I value your feedback.  Brooke Bonito, MSN, FNP-BC, AGACNP-BC [facility]

## 2022-12-07 NOTE — ED Notes (Signed)
Called for diet tray

## 2022-12-07 NOTE — ED Provider Notes (Signed)
[place] EMERGENCY DEPARTMENT AT [facility] Provider Note   CSN: [id number] Arrival date & time: 12/07/22  8413     History {Add pertinent medical, surgical, social history, OB history to HPI:1} Chief Complaint  Patient presents with   Weakness    Shawn Velazquez is a 57 y.o. male.  Patient with cirrhosis and persistent hypotension. 2   Weakness      Home Medications Prior to Admission medications   Medication Sig Start Date End Date Taking? Authorizing Provider  albuterol (VENTOLIN HFA) 108 (90 Base) MCG/ACT inhaler Inhale 2 puffs into the lungs every 6 (six) hours as needed for wheezing or shortness of breath. 09/07/22  Yes Mahon, Courtney L, NP  busPIRone (BUSPAR) 5 MG tablet Take 1 tablet by mouth daily as needed. 10/05/22  Yes [provider]  carvedilol (COREG) 3.125 MG tablet Take 1 tablet (3.125 mg total) by mouth 2 (two) times daily with a meal. 09/28/22 12/27/22 Yes Shahmehdi, Seyed A, MD  Continuous Blood Gluc Sensor (DEXCOM G7 SENSOR) MISC Inject 1 application. into the skin as directed. Change sensor every 10 days as directed. 12/09/21  Yes Dani Gobble, NP  ferrous sulfate 324 MG TBEC Take 324 mg by mouth daily with breakfast.   Yes [provider]  FLUoxetine (PROZAC) 10 MG capsule TAKE 4 CAPSULES BY MOUTH EVERY DAY Patient taking differently: Take 10 mg by mouth See admin instructions. TAKE 2 CAPSULES IN THE MORNING AND 2 CAPSULES EVERY EVENING. 02/17/22  Yes Paseda, Baird Kay, FNP  folic acid (FOLVITE) 1 MG tablet TAKE 1 TABLET BY MOUTH DAILY 12/02/22  Yes Mahon, Courtney L, NP  furosemide (LASIX) 40 MG tablet Take 1 tablet (40 mg total) by mouth daily. 09/28/22 12/27/22 Yes Shahmehdi, Seyed A, MD  gabapentin (NEURONTIN) 100 MG capsule Take 1 tablet by mouth daily. 10/05/22  Yes [provider]  insulin glargine (LANTUS SOLOSTAR) 100 UNIT/ML Solostar Pen Inject 35 Units into the skin at bedtime. 09/09/22  Yes Reardon, Freddi Starr,  NP  insulin lispro (HUMALOG KWIKPEN) 100 UNIT/ML KwikPen Inject 10-16 Units into the skin 3 (three) times daily. 09/09/22  Yes Reardon, Freddi Starr, NP  levocetirizine (XYZAL) 5 MG tablet Take 1 tablet by mouth daily. 10/05/22  Yes [provider]  Multiple Vitamin (MULTIVITAMIN) tablet Take 1 tablet by mouth daily.   Yes [provider]  ondansetron (ZOFRAN-ODT) 4 MG disintegrating tablet Take 1 tablet (4 mg total) by mouth every 8 (eight) hours as needed for nausea or vomiting. 11/04/22  Yes Mahon, Frederik Schmidt, NP  pantoprazole (PROTONIX) 40 MG tablet Take 1 tablet (40 mg total) by mouth 2 (two) times daily. 11/04/22 12/07/22 Yes Mahon, Frederik Schmidt, NP  polyethylene glycol (MIRALAX / GLYCOLAX) 17 g packet Take 17 g by mouth daily as needed for mild constipation. 09/28/22  Yes Shahmehdi, Gemma Payor, MD  rifaximin (XIFAXAN) 550 MG TABS tablet Take 1 tablet (550 mg total) by mouth 2 (two) times daily. 10/14/22  Yes Mahon, Frederik Schmidt, NP  spironolactone (ALDACTONE) 50 MG tablet Take 3 tablets (150 mg total) by mouth daily. 09/29/22 01/27/23 Yes Shahmehdi, Gemma Payor, MD  thiamine (VITAMIN B-1) 100 MG tablet Take 1 tablet (100 mg total) by mouth daily. 08/06/22  Yes Johnson, Clanford L, MD  glucose blood test strip Use as instructed to monitor glucose 4 times daily 12/09/21   Dani Gobble, NP  Insulin Pen Needle (PEN NEEDLES) 32G X 4 MM MISC 1 each by Does  not apply route in the morning, at noon, in the evening, and at bedtime. Use to inject insulin 4 times daily 05/12/22   Dani Gobble, NP      Allergies    Neosporin + pain relief max st [neomy-bacit-polymyx-pramoxine]    Review of Systems   Review of Systems  Neurological:  Positive for weakness.    Physical Exam Updated Vital Signs BP 91/61   Pulse (!) 49   Temp (!) 97.5 F (36.4 C) (Oral)   Resp 15   SpO2 95%  Physical Exam  ED Results / Procedures / Treatments   Labs (all labs ordered are listed, but only abnormal results are  displayed) Labs Reviewed  BASIC METABOLIC PANEL - Abnormal; Notable for the following components:      Result Value   Sodium 126 (*)    Chloride 94 (*)    Glucose, Bld 196 (*)    BUN 5 (*)    Calcium 8.4 (*)    All other components within normal limits  CBC - Abnormal; Notable for the following components:   RBC 3.29 (*)    Hemoglobin 10.5 (*)    HCT 31.3 (*)    RDW 17.5 (*)    Platelets 115 (*)    All other components within normal limits  PROTIME-INR - Abnormal; Notable for the following components:   Prothrombin Time 22.4 (*)    INR 1.9 (*)    All other components within normal limits  HEPATIC FUNCTION PANEL - Abnormal; Notable for the following components:   Total Protein 5.8 (*)    Albumin 2.3 (*)    AST 53 (*)    Alkaline Phosphatase 131 (*)    Total Bilirubin 3.6 (*)    Bilirubin, Direct 0.9 (*)    Indirect Bilirubin 2.7 (*)    All other components within normal limits  CBG MONITORING, ED - Abnormal; Notable for the following components:   Glucose-Capillary 191 (*)    All other components within normal limits    EKG None  Radiology No results found.  Procedures Procedures  {Document cardiac monitor, telemetry assessment procedure when appropriate:1}  Medications Ordered in ED Medications  midodrine (PROAMATINE) tablet 5 mg (has no administration in time range)  sodium chloride 0.9 % bolus 1,000 mL (0 mLs Intravenous Stopped 12/07/22 1106)  sodium chloride 0.9 % bolus 500 mL (0 mLs Intravenous Stopped 12/07/22 1155)  albumin human 5 % solution 25 g (25 g Intravenous New Bag/Given 12/07/22 1158)  sodium chloride 0.9 % bolus 500 mL (500 mLs Intravenous New Bag/Given 12/07/22 1434)    ED Course/ Medical Decision Making/ A&P   {   Click here for ABCD2, HEART and other calculatorsREFRESH Note before signing :1}                          Medical Decision Making Amount and/or Complexity of Data Reviewed Labs: ordered.  Risk Prescription drug management. Decision  regarding hospitalization.   Patient with hypotension secondary to cirrhosis and beta-blocker.  He will be admitted to medicine  {Document critical care time when appropriate:1} {Document review of labs and clinical decision tools ie heart score, Chads2Vasc2 etc:1}  {Document your independent review of radiology images, and any outside records:1} {Document your discussion with family members, caretakers, and with consultants:1} {Document social determinants of health affecting pt's care:1} {Document your decision making why or why not admission, treatments were needed:1} Final Clinical Impression(s) / ED Diagnoses Final  diagnoses:  None    Rx / DC Orders ED Discharge Orders     None

## 2022-12-07 NOTE — ED Triage Notes (Signed)
Pt seen by GI today for hypotension. Bp there was 80/54. Pt needs fluids and possible albumin and Cancer center did not have apt so was sent to ED. Pt a/o. Color slightly jaundiced/pale. Dizziness sometimes with bending over or getting up fast. Pt denies pain. No current chemo/radiation.

## 2022-12-08 ENCOUNTER — Telehealth (INDEPENDENT_AMBULATORY_CARE_PROVIDER_SITE_OTHER): Payer: Self-pay | Admitting: Gastroenterology

## 2022-12-08 ENCOUNTER — Ambulatory Visit: Payer: BC Managed Care – PPO | Admitting: Gastroenterology

## 2022-12-08 DIAGNOSIS — E860 Dehydration: Secondary | ICD-10-CM | POA: Diagnosis not present

## 2022-12-08 DIAGNOSIS — E1165 Type 2 diabetes mellitus with hyperglycemia: Secondary | ICD-10-CM | POA: Diagnosis not present

## 2022-12-08 DIAGNOSIS — E871 Hypo-osmolality and hyponatremia: Secondary | ICD-10-CM | POA: Diagnosis not present

## 2022-12-08 DIAGNOSIS — D500 Iron deficiency anemia secondary to blood loss (chronic): Secondary | ICD-10-CM | POA: Diagnosis not present

## 2022-12-08 DIAGNOSIS — Z794 Long term (current) use of insulin: Secondary | ICD-10-CM | POA: Diagnosis not present

## 2022-12-08 DIAGNOSIS — R531 Weakness: Secondary | ICD-10-CM | POA: Diagnosis not present

## 2022-12-08 DIAGNOSIS — E878 Other disorders of electrolyte and fluid balance, not elsewhere classified: Secondary | ICD-10-CM | POA: Diagnosis not present

## 2022-12-08 DIAGNOSIS — I959 Hypotension, unspecified: Secondary | ICD-10-CM | POA: Diagnosis not present

## 2022-12-08 DIAGNOSIS — I10 Essential (primary) hypertension: Secondary | ICD-10-CM | POA: Diagnosis not present

## 2022-12-08 LAB — PROTIME-INR
INR: 1.9 — ABNORMAL HIGH (ref 0.8–1.2)
Prothrombin Time: 22.4 seconds — ABNORMAL HIGH (ref 11.4–15.2)

## 2022-12-08 LAB — CBC
HCT: 27.1 % — ABNORMAL LOW (ref 39.0–52.0)
Hemoglobin: 9.4 g/dL — ABNORMAL LOW (ref 13.0–17.0)
MCH: 33.5 pg (ref 26.0–34.0)
MCHC: 34.7 g/dL (ref 30.0–36.0)
MCV: 96.4 fL (ref 80.0–100.0)
Platelets: 102 10*3/uL — ABNORMAL LOW (ref 150–400)
RBC: 2.81 MIL/uL — ABNORMAL LOW (ref 4.22–5.81)
RDW: 17.2 % — ABNORMAL HIGH (ref 11.5–15.5)
WBC: 7.5 10*3/uL (ref 4.0–10.5)
nRBC: 0 % (ref 0.0–0.2)

## 2022-12-08 LAB — COMPREHENSIVE METABOLIC PANEL
ALT: 18 U/L (ref 0–44)
AST: 56 U/L — ABNORMAL HIGH (ref 15–41)
Albumin: 2.6 g/dL — ABNORMAL LOW (ref 3.5–5.0)
Alkaline Phosphatase: 117 U/L (ref 38–126)
Anion gap: 8 (ref 5–15)
BUN: 5 mg/dL — ABNORMAL LOW (ref 6–20)
CO2: 20 mmol/L — ABNORMAL LOW (ref 22–32)
Calcium: 8.2 mg/dL — ABNORMAL LOW (ref 8.9–10.3)
Chloride: 102 mmol/L (ref 98–111)
Creatinine, Ser: 0.79 mg/dL (ref 0.61–1.24)
GFR, Estimated: >60 mL/min (ref >60)
Glucose, Bld: 216 mg/dL — ABNORMAL HIGH (ref 70–99)
Potassium: 4.3 mmol/L (ref 3.5–5.1)
Sodium: 130 mmol/L — ABNORMAL LOW (ref 135–145)
Total Bilirubin: 3.7 mg/dL — ABNORMAL HIGH (ref 0.3–1.2)
Total Protein: 5.7 g/dL — ABNORMAL LOW (ref 6.5–8.1)

## 2022-12-08 LAB — GLUCOSE, CAPILLARY
Glucose-Capillary: 155 mg/dL — ABNORMAL HIGH (ref 70–99)
Glucose-Capillary: 168 mg/dL — ABNORMAL HIGH (ref 70–99)

## 2022-12-08 MED ORDER — FUROSEMIDE 40 MG PO TABS: 40.0000 mg | ORAL_TABLET | Freq: Every day | ORAL | 2 refills | Status: DC

## 2022-12-08 MED ORDER — FERROUS SULFATE 324 MG PO TBEC: 324.0000 mg | DELAYED_RELEASE_TABLET | Freq: Every day | ORAL | 3 refills | Status: DC

## 2022-12-08 MED ORDER — LACTULOSE 10 GM/15ML PO SOLN: 10.0000 g | Freq: Every day | ORAL | 1 refills | Status: DC

## 2022-12-08 MED ORDER — PANTOPRAZOLE SODIUM 40 MG PO TBEC: 40.0000 mg | DELAYED_RELEASE_TABLET | Freq: Two times a day (BID) | ORAL | 5 refills | Status: DC

## 2022-12-08 MED ORDER — VITAMIN B-1 100 MG PO TABS: 100.0000 mg | ORAL_TABLET | Freq: Every day | ORAL | 3 refills | Status: DC

## 2022-12-08 MED ORDER — BUSPIRONE HCL 5 MG PO TABS: 5.0000 mg | ORAL_TABLET | Freq: Two times a day (BID) | ORAL | 2 refills | Status: DC

## 2022-12-08 MED ORDER — RIFAXIMIN 550 MG PO TABS
550.0000 mg | ORAL_TABLET | Freq: Two times a day (BID) | ORAL | 2 refills | Status: DC
Start: 2022-12-08 — End: 2023-02-14

## 2022-12-08 MED ORDER — MIDODRINE HCL 5 MG PO TABS: 5.0000 mg | ORAL_TABLET | Freq: Three times a day (TID) | ORAL | 3 refills | Status: DC

## 2022-12-08 MED ORDER — LANTUS SOLOSTAR 100 UNIT/ML ~~LOC~~ SOPN: 30.0000 [IU] | PEN_INJECTOR | Freq: Every day | SUBCUTANEOUS | 3 refills | Status: DC

## 2022-12-08 MED ORDER — SPIRONOLACTONE 50 MG PO TABS: 50.0000 mg | ORAL_TABLET | Freq: Every day | ORAL | 3 refills | Status: DC

## 2022-12-08 MED ORDER — CARVEDILOL 3.125 MG PO TABS: 3.1250 mg | ORAL_TABLET | Freq: Two times a day (BID) | ORAL | 2 refills | Status: DC

## 2022-12-08 MED ORDER — ALBUTEROL SULFATE HFA 108 (90 BASE) MCG/ACT IN AERS
2.0000 | INHALATION_SPRAY | Freq: Four times a day (QID) | RESPIRATORY_TRACT | 0 refills | Status: DC | PRN
Start: 2022-12-08 — End: 2023-02-22

## 2022-12-08 NOTE — Discharge Instructions (Signed)
1)Avoid ibuprofen/Advil/Aleve/Motrin/Goody Powders/Naproxen/BC powders/Meloxicam/Diclofenac/Indomethacin and other Nonsteroidal anti-inflammatory medications as these will make you more likely to bleed and can cause stomach ulcers, can also cause Kidney problems.   2)Repeat CBC, INR and CMP Blood Tests Next week with your GI provider  3) Aldactone/Spironolactone has been reduced to 50 mg daily from 150 mg -starting on 12/09/22  4)You have been started on Midodrine (Proamatine) 5 mg 3 times daily to help Prevent Low Blood Pressure  5)Restart Lasix at 40 mg daily on 12/11/22

## 2022-12-08 NOTE — TOC CM/SW Note (Signed)
Transition of Care [facility]) - Inpatient Brief Assessment   Patient Details  Name: ALESSO SCHWALENBERG MRN: [id number] Date of Birth: 1966/01/07  Transition of Care [facility]) CM/SW Contact:    Villa Herb, LCSWA Phone Number: 12/08/2022, 9:59 AM   Clinical Narrative: Transition of Care Department [facility]) has reviewed patient and no TOC needs have been identified at this time. We will continue to monitor patient advancement through interdisciplinary progression rounds. If new patient transition needs arise, please place a TOC consult.  Transition of Care Asessment: Insurance and Status: Insurance coverage has been reviewed Patient has primary care physician: Yes Home environment has been reviewed: from home Prior level of function:: independent Prior/Current Home Services: No current home services Social Determinants of Health Reivew: SDOH reviewed no interventions necessary Readmission risk has been reviewed: Yes Transition of care needs: no transition of care needs at this time

## 2022-12-08 NOTE — Discharge Summary (Signed)
Shawn Velazquez, is a 57 y.o. male  DOB 1965/08/18  MRN [id number].  Admission date:  12/07/2022  Admitting Physician  Akiva Brassfield Mariea Clonts, MD  Discharge Date:  12/08/2022   Primary MD  [name], MD  Recommendations for primary care physician for things to follow:  1)Avoid ibuprofen/Advil/Aleve/Motrin/Goody Powders/Naproxen/BC powders/Meloxicam/Diclofenac/Indomethacin and other Nonsteroidal anti-inflammatory medications as these will make you more likely to bleed and can cause stomach ulcers, can also cause Kidney problems.   2)Repeat CBC, INR and CMP Blood Tests Next week with your GI provider  3) Aldactone/Spironolactone has been reduced to 50 mg daily from 150 mg -starting on 12/09/22  4)You have been started on Midodrine (Proamatine) 5 mg 3 times daily to help Prevent Low Blood Pressure  5)Restart Lasix at 40 mg daily on 12/11/22  Admission Diagnosis  Symptomatic hypotension [I95.9]   Discharge Diagnosis  Symptomatic hypotension [I95.9]    Principal Problem:   Symptomatic hypotension Active Problems:   Hyponatremia   Thrombocytopenia   Alcoholic cirrhosis of liver with ascites (HCC)   Uncontrolled type 2 diabetes mellitus with hyperglycemia (HCC)   Essential hypertension   Diabetes mellitus (HCC)   Symptomatic anemia   Iron deficiency anemia due to chronic blood loss      Past Medical History:  Diagnosis Date   Anxiety    Arthritis    affects hands, shoulder, neck, knees, hips, ankles, toes   Chronic back pain    Diabetes mellitus    diet controlled   Eczema    Fatty liver, alcoholic    GERD (gastroesophageal reflux disease)    Glaucoma    slight case   Hypertension    Iron deficiency anemia due to chronic blood loss 11/25/2022   Psoriasis    Sleep apnea    Substance abuse (HCC)    Vertigo 09/11/2017   Wears glasses     Past Surgical History:  Procedure Laterality Date    COLONOSCOPY WITH PROPOFOL N/A 04/05/2022   Procedure: COLONOSCOPY WITH PROPOFOL;  Surgeon: Lanelle Bal, DO;  [facility];  Service: Endoscopy;  Laterality: N/A;  10:45 AM,unable to reach pt to move up   ESOPHAGEAL BANDING N/A 08/26/2022   Procedure: ESOPHAGEAL BANDING;  Surgeon: Lanelle Bal, DO;  [facility];  Service: Endoscopy;  Laterality: N/A;   ESOPHAGEAL BANDING  09/24/2022   Procedure: ESOPHAGEAL BANDING;  Surgeon: Corbin Ade, MD;  [facility];  Service: Endoscopy;;   ESOPHAGOGASTRODUODENOSCOPY (EGD) WITH PROPOFOL N/A 08/26/2022   Procedure: ESOPHAGOGASTRODUODENOSCOPY (EGD) WITH PROPOFOL;  Surgeon: Lanelle Bal, DO;  [facility];  Service: Endoscopy;  Laterality: N/A;   ESOPHAGOGASTRODUODENOSCOPY (EGD) WITH PROPOFOL N/A 09/24/2022   Procedure: ESOPHAGOGASTRODUODENOSCOPY (EGD) WITH PROPOFOL;  Surgeon: Corbin Ade, MD;  [facility];  Service: Endoscopy;  Laterality: N/A;  hematemesis, esophageal varices, melena   MULTIPLE TOOTH EXTRACTIONS     POLYPECTOMY  04/05/2022   Procedure: POLYPECTOMY;  Surgeon: Lanelle Bal, DO;  [facility];  Service: Endoscopy;;   POSTERIOR CERVICAL LAMINECTOMY  04/21/2012   Procedure: POSTERIOR CERVICAL LAMINECTOMY;  Surgeon: Mariam Dollar, MD;  [facility]: [facility];  Service: Neurosurgery;  Laterality: Left;  Posterior Cervical laminectomy/foraminotomy and diskectomy, left cervical seven-thoracic one   ROTATOR CUFF REPAIR     THORACIC DISCECTOMY Left 04/03/2014   Procedure: Left Thoracic Seven to Eight, Thoracic Eight to Nine Thoracic Discectomy ;  Surgeon: Mariam Dollar, MD;  Location: [facility];  Service: Neurosurgery;  Laterality: Left;      HPI  from the history and physical done on the day of admission:   Shawn Velazquez  is a 57 y.o. male with pmhx relevant for ongoing tobacco abuse, depression anxiety, DM2, alcoholic liver cirrhosis with chronic anemia and  thrombocytopenia, history of GI bleed in the setting of esophageal varices requiring banding in the past with iron deficiency anemia presents from GI clinic on 12/07/2022 with concerns for dehydration and hypotension in the setting of diuretic use and antihypertensives No fever  Or chills  -No chest pains no palpitations -He does have orthostatic dizziness -No URI or UTI symptoms -Additional history obtained from GI team -office progress note today from Ms. Brooke Bonito reviewed -ED patient was hypotensive required IV fluids and IV albumin -AST 53 ALT 20 alk phos 131 T. bili 3.6 indirect bili of 2.7 with a direct bili of 0.9, albumin is only 2.3 -Sodium is low at 126 chloride 94 potassium 3.9, creatinine 0.83 -INR is 1.9     Hospital Course:    1) dehydration and hypotension in the setting of antihypertensives and diuretic use in the cirrhotic patient--- -Blood pressure 77/47 in the ED after initial intervention systolic blood pressure was up to the 80s -Patient was bradycardic -We initially held PTA Aldactone, Lasix and Coreg --Patient received IV fluids and IV albumin -We added Midodrine -BP improved with above measures Pt's sister Rivka Safer is a Pharmacy technician--she usually manages patient's medications---I called and updated patient's sister -Restart Coreg 3.125 mg twice daily on 12/09/2022 - reduce Aldactone to 50 mg daily from 150 mg starting 12/09/2022 -Midodrine 5 mg 3 times daily starting today -Lasix 40 mg daily starting Saturday, 12/11/2022 -Monitor BP at home and keep a diary Follow-up with GI team within a week advised   2) alcoholic liver cirrhosis--with prior variceal bleed -Patient is in process of being referred to [facility] transplant team for evaluation -Continue Protonix and lactulose -Continue multivitamin folic acid and thiamine  -Continue rifaximin -No significant ascites at this time   3) depression and anxiety--stable, continue buspirone and Prozac   4)  chronic iron deficiency anemia--- prior history of esophageal variceal bleeding, status post prior esophageal variceal banding Hgb is 10.5 which is at a higher than recent baseline -Patient gets iron infusion from time to time-- --currently scheduled for another iron infusion with hematology team on 12/10/2022 -No evidence of ongoing active bleeding at this time   5) tobacco abuse--- smoking cessation advised   6) hyponatremia/hypochloremia-----Sodium was low at 126 chloride 94  -Sodium up to 130 -This is due to diuretic use -Aldactone and Lasix adjusted as above   7)DM2--  -Lantus 20 units nightly Use Novolog/Humalog Sliding scale insulin with Accu-Cheks/Fingersticks as ordered    Dispo: The patient is from: Home              Anticipated d/c is to: Home  Discharge Condition: Stable  Follow UP   Follow-up Information     Boris Lown,  Frederik Schmidt, NP. Schedule an appointment as soon as possible for a visit in 1 week(s).   Specialty: Gastroenterology Contact information: [address] [place] [address] [phone]                  Diet and Activity recommendation:  As advised  Discharge Instructions   Discharge Instructions     Call MD for:  difficulty breathing, headache or visual disturbances   Complete by: As directed    Call MD for:  persistant dizziness or light-headedness   Complete by: As directed    Call MD for:  persistant nausea and vomiting   Complete by: As directed    Call MD for:  temperature >100.4   Complete by: As directed    Diet - low sodium heart healthy   Complete by: As directed    Discharge instructions   Complete by: As directed    1)Avoid ibuprofen/Advil/Aleve/Motrin/Goody Powders/Naproxen/BC powders/Meloxicam/Diclofenac/Indomethacin and other Nonsteroidal anti-inflammatory medications as these will make you more likely to bleed and can cause stomach ulcers, can also cause Kidney problems.   2)Repeat CBC, INR and CMP Blood Tests Next week  with your GI provider  3) Aldactone/Spironolactone has been reduced to 50 mg daily from 150 mg -starting on 12/09/22  4)You have been started on Midodrine (Proamatine) 5 mg 3 times daily to help Prevent Low Blood Pressure  5)Restart Lasix at 40 mg daily on 12/11/22   Increase activity slowly   Complete by: As directed        Discharge Medications     Allergies as of 12/08/2022       Reactions   Neosporin + Pain Relief Max St [neomy-bacit-polymyx-pramoxine] Rash        Medication List     STOP taking these medications    polyethylene glycol 17 g packet Commonly known as: MIRALAX / GLYCOLAX       TAKE these medications    albuterol 108 (90 Base) MCG/ACT inhaler Commonly known as: VENTOLIN HFA Inhale 2 puffs into the lungs every 6 (six) hours as needed for wheezing or shortness of breath.   busPIRone 5 MG tablet Commonly known as: BUSPAR Take 1 tablet (5 mg total) by mouth 2 (two) times daily. What changed:  when to take this reasons to take this   carvedilol 3.125 MG tablet Commonly known as: COREG Take 1 tablet (3.125 mg total) by mouth 2 (two) times daily with a meal. Start taking on: December 09, 2022   Dexcom G7 Sensor Misc Inject 1 application. into the skin as directed. Change sensor every 10 days as directed.   ferrous sulfate 324 MG Tbec Take 1 tablet (324 mg total) by mouth daily with breakfast.   FLUoxetine 10 MG capsule Commonly known as: PROZAC TAKE 4 CAPSULES BY MOUTH EVERY DAY What changed: See the new instructions.   folic acid 1 MG tablet Commonly known as: FOLVITE TAKE 1 TABLET BY MOUTH DAILY   furosemide 40 MG tablet Commonly known as: Lasix Take 1 tablet (40 mg total) by mouth daily. Start taking on: December 11, 2022 What changed: These instructions start on December 11, 2022. If you are unsure what to do until then, ask your doctor or other care provider.   gabapentin 100 MG capsule Commonly known as: NEURONTIN Take 1 tablet by mouth daily.    glucose blood test strip Use as instructed to monitor glucose 4 times daily   insulin lispro 100 UNIT/ML KwikPen Commonly known as: HumaLOG KwikPen  Inject 10-16 Units into the skin 3 (three) times daily.   lactulose 10 GM/15ML solution Commonly known as: CHRONULAC Take 15 mLs (10 g total) by mouth daily.   Lantus SoloStar 100 UNIT/ML Solostar Pen Generic drug: insulin glargine Inject 30 Units into the skin at bedtime. What changed: how much to take   levocetirizine 5 MG tablet Commonly known as: XYZAL Take 1 tablet by mouth daily.   midodrine 5 MG tablet Commonly known as: PROAMATINE Take 1 tablet (5 mg total) by mouth 3 (three) times daily with meals. To help Prevent Low Blood Pressure   multivitamin tablet Take 1 tablet by mouth daily.   ondansetron 4 MG disintegrating tablet Commonly known as: ZOFRAN-ODT Take 1 tablet (4 mg total) by mouth every 8 (eight) hours as needed for nausea or vomiting.   pantoprazole 40 MG tablet Commonly known as: Protonix Take 1 tablet (40 mg total) by mouth 2 (two) times daily.   Pen Needles 32G X 4 MM Misc 1 each by Does not apply route in the morning, at noon, in the evening, and at bedtime. Use to inject insulin 4 times daily   rifaximin 550 MG Tabs tablet Commonly known as: XIFAXAN Take 1 tablet (550 mg total) by mouth 2 (two) times daily.   spironolactone 50 MG tablet Commonly known as: ALDACTONE Take 1 tablet (50 mg total) by mouth daily. Start taking on: December 09, 2022 What changed: how much to take   thiamine 100 MG tablet Commonly known as: Vitamin B-1 Take 1 tablet (100 mg total) by mouth daily.       Major procedures and Radiology Reports - PLEASE review detailed and final reports for all details, in brief -   Today   Subjective    Shawn Velazquez today has no new complaints - Eating and drinking well -No further dizziness -No fever  Or chills  -Patient girlfriend [name] is at bedside Pt's sister Rivka Safer  is a Pharmacy technician--she usually manages patient's medications---I called and updated patient's sister   Patient has been seen and examined prior to discharge   Objective   Blood pressure 105/64, pulse (!) 52, temperature (!) 97.4 F (36.3 C), temperature source Oral, resp. rate 19, height 5\' 6"  (1.676 m), weight 80.5 kg, SpO2 98 %.   Intake/Output Summary (Last 24 hours) at 12/08/2022 1455 Last data filed at 12/08/2022 1300 Gross per 24 hour  Intake 2091 ml  Output 1650 ml  Net 441 ml   Exam Gen:- Awake Alert, no acute distress  HEENT:- [place].AT, No sclera icterus Neck-Supple Neck,No JVD,.  Lungs-  CTAB , good air movement bilaterally CV- S1, S2 normal, regular Abd-  +ve B.Sounds, Abd Soft, No tenderness, no significant ascites    Extremity/Skin:- No  edema,   good pulses Psych-affect is appropriate, oriented x3 Neuro-no new focal deficits, no tremors    Data Review   CBC w Diff:  Lab Results  Component Value Date   WBC 7.5 12/08/2022   HGB 9.4 (L) 12/08/2022   HGB 14.4 07/31/2021   HCT 27.1 (L) 12/08/2022   HCT 42.4 07/31/2021   PLT 102 (L) 12/08/2022   PLT 146 (L) 07/31/2021   LYMPHOPCT 21 11/11/2022   MONOPCT 10 11/11/2022   EOSPCT 4 11/11/2022   BASOPCT 1 11/11/2022   CMP:  Lab Results  Component Value Date   NA 130 (L) 12/08/2022   NA 135 11/05/2022   K 4.3 12/08/2022   CL 102 12/08/2022   CO2  20 (L) 12/08/2022   BUN 5 (L) 12/08/2022   BUN 5 (L) 11/05/2022   CREATININE 0.79 12/08/2022   PROT 5.7 (L) 12/08/2022   PROT 6.5 11/05/2022   ALBUMIN 2.6 (L) 12/08/2022   ALBUMIN 3.1 (L) 11/05/2022   BILITOT 3.7 (H) 12/08/2022   BILITOT 3.3 (H) 11/05/2022   ALKPHOS 117 12/08/2022   AST 56 (H) 12/08/2022   ALT 18 12/08/2022  . Total Discharge time is about 33 minutes  Shon Hale M.D on 12/08/2022 at 2:55 PM  Go to [email or web address] -  for contact info  [facility] - Office  [phone]

## 2022-12-08 NOTE — Progress Notes (Signed)
Patient had one emesis this shift, Zofran given with relief to patient. He slept on and off the rest of the shift with no complaint of pain. Continued to monitor.

## 2022-12-08 NOTE — Telephone Encounter (Signed)
Noted  

## 2022-12-09 MED FILL — Iron Sucrose Inj 20 MG/ML (Fe Equiv): INTRAVENOUS | Qty: 15 | Status: AC

## 2022-12-10 ENCOUNTER — Inpatient Hospital Stay: Payer: BC Managed Care – PPO | Attending: Hematology

## 2022-12-10 VITALS — BP 117/60 | HR 58 | Temp 97.3°F | Resp 18

## 2022-12-10 DIAGNOSIS — D500 Iron deficiency anemia secondary to blood loss (chronic): Secondary | ICD-10-CM

## 2022-12-10 DIAGNOSIS — D649 Anemia, unspecified: Secondary | ICD-10-CM | POA: Insufficient documentation

## 2022-12-10 DIAGNOSIS — D696 Thrombocytopenia, unspecified: Secondary | ICD-10-CM | POA: Diagnosis not present

## 2022-12-10 DIAGNOSIS — Z79899 Other long term (current) drug therapy: Secondary | ICD-10-CM | POA: Diagnosis not present

## 2022-12-10 MED ORDER — CETIRIZINE HCL 10 MG PO TABS
10.0000 mg | ORAL_TABLET | Freq: Once | ORAL | Status: AC
  Administered 2022-12-10: 10 mg via ORAL
  Filled 2022-12-10: qty 1

## 2022-12-10 MED ORDER — SODIUM CHLORIDE 0.9 % IV SOLN: Freq: Once | INTRAVENOUS | Status: AC

## 2022-12-10 MED ORDER — SODIUM CHLORIDE 0.9 % IV SOLN
300.0000 mg | Freq: Once | INTRAVENOUS | Status: AC
  Administered 2022-12-10: 300 mg via INTRAVENOUS
  Filled 2022-12-10: qty 300

## 2022-12-10 NOTE — Patient Instructions (Signed)
[facility]  Discharge Instructions: Thank you for choosing [facility] to provide your oncology and hematology care.  If you have a lab appointment with the Cancer Center - please note that after April 8th, 2024, all labs will be drawn in the cancer center.  You do not have to check in or register with the main entrance as you have in the past but will complete your check-in in the cancer center.  Wear comfortable clothing and clothing appropriate for easy access to any Portacath or PICC line.   We strive to give you quality time with your provider. You may need to reschedule your appointment if you arrive late (15 or more minutes).  Arriving late affects you and other patients whose appointments are after yours.  Also, if you miss three or more appointments without notifying the office, you may be dismissed from the clinic at the provider's discretion.      For prescription refill requests, have your pharmacy contact our office and allow 72 hours for refills to be completed.    Today you received the following chemotherapy and/or immunotherapy agents Venofer 300 mg.  Iron Sucrose Injection What is this medication? IRON SUCROSE (EYE ern SOO krose) treats low levels of iron (iron deficiency anemia) in people with kidney disease. Iron is a mineral that plays an important role in making red blood cells, which carry oxygen from your lungs to the rest of your body. This medicine may be used for other purposes; ask your health care provider or pharmacist if you have questions. COMMON BRAND NAME(S): Venofer What should I tell my care team before I take this medication? They need to know if you have any of these conditions: Anemia not caused by low iron levels Heart disease High levels of iron in the blood Kidney disease Liver disease An unusual or allergic reaction to iron, other medications, foods, dyes, or preservatives Pregnant or trying to get  pregnant Breastfeeding How should I use this medication? This medication is for infusion into a vein. It is given in a hospital or clinic setting. Talk to your care team about the use of this medication in children. While this medication may be prescribed for children as young as 2 years for selected conditions, precautions do apply. Overdosage: If you think you have taken too much of this medicine contact a poison control center or emergency room at once. NOTE: This medicine is only for you. Do not share this medicine with others. What if I miss a dose? Keep appointments for follow-up doses. It is important not to miss your dose. Call your care team if you are unable to keep an appointment. What may interact with this medication? Do not take this medication with any of the following: Deferoxamine Dimercaprol Other iron products This medication may also interact with the following: Chloramphenicol Deferasirox This list may not describe all possible interactions. Give your health care provider a list of all the medicines, herbs, non-prescription drugs, or dietary supplements you use. Also tell them if you smoke, drink alcohol, or use illegal drugs. Some items may interact with your medicine. What should I watch for while using this medication? Visit your care team regularly. Tell your care team if your symptoms do not start to get better or if they get worse. You may need blood work done while you are taking this medication. You may need to follow a special diet. Talk to your care team. Foods that contain iron include:  whole grains/cereals, dried fruits, beans, or peas, leafy green vegetables, and organ meats (liver, kidney). What side effects may I notice from receiving this medication? Side effects that you should report to your care team as soon as possible: Allergic reactions--skin rash, itching, hives, swelling of the face, lips, tongue, or throat Low blood pressure--dizziness, feeling  faint or lightheaded, blurry vision Shortness of breath Side effects that usually do not require medical attention (report to your care team if they continue or are bothersome): Flushing Headache Joint pain Muscle pain Nausea Pain, redness, or irritation at injection site This list may not describe all possible side effects. Call your doctor for medical advice about side effects. You may report side effects to FDA at 1-800-FDA-1088. Where should I keep my medication? This medication is given in a hospital or clinic and will not be stored at home. NOTE: This sheet is a summary. It may not cover all possible information. If you have questions about this medicine, talk to your doctor, pharmacist, or health care provider.  2024 Elsevier/Gold Standard (2021-12-30 00:00:00)       To help prevent nausea and vomiting after your treatment, we encourage you to take your nausea medication as directed.  BELOW ARE SYMPTOMS THAT SHOULD BE REPORTED IMMEDIATELY: *FEVER GREATER THAN 100.4 F (38 C) OR HIGHER *CHILLS OR SWEATING *NAUSEA AND VOMITING THAT IS NOT CONTROLLED WITH YOUR NAUSEA MEDICATION *UNUSUAL SHORTNESS OF BREATH *UNUSUAL BRUISING OR BLEEDING *URINARY PROBLEMS (pain or burning when urinating, or frequent urination) *BOWEL PROBLEMS (unusual diarrhea, constipation, pain near the anus) TENDERNESS IN MOUTH AND THROAT WITH OR WITHOUT PRESENCE OF ULCERS (sore throat, sores in mouth, or a toothache) UNUSUAL RASH, SWELLING OR PAIN  UNUSUAL VAGINAL DISCHARGE OR ITCHING   Items with * indicate a potential emergency and should be followed up as soon as possible or go to the Emergency Department if any problems should occur.  Please show the CHEMOTHERAPY ALERT CARD or IMMUNOTHERAPY ALERT CARD at check-in to the Emergency Department and triage nurse.  Should you have questions after your visit or need to cancel or reschedule your appointment, please contact [facility]  [phone]  and follow the prompts.  Office hours are 8:00 a.m. to 4:30 p.m. Monday - Friday. Please note that voicemails left after 4:00 p.m. may not be returned until the following business day.  We are closed weekends and major holidays. You have access to a nurse at all times for urgent questions. Please call the main number to the clinic [phone] and follow the prompts.  For any non-urgent questions, you may also contact your provider using MyChart. We now offer e-Visits for anyone 58 and older to request care online for non-urgent symptoms. For details visit [email or web address].   Also download the MyChart app! Go to the app store, search "MyChart", open the app, select [place], and log in with your MyChart username and password.

## 2022-12-10 NOTE — Progress Notes (Signed)
Patient presents today for Venofer 300 mg iron infusion. Vital signs stable. Patient denies any side effects related to iron infusion last week.   Venofer given today per MD orders. Tolerated infusion without adverse affects. Vital signs stable. No complaints at this time. Discharged from clinic ambulatory in stable condition. Alert and oriented x 3. F/U with [facility] as scheduled.

## 2022-12-14 DIAGNOSIS — I959 Hypotension, unspecified: Secondary | ICD-10-CM | POA: Diagnosis not present

## 2022-12-16 DIAGNOSIS — E1165 Type 2 diabetes mellitus with hyperglycemia: Secondary | ICD-10-CM | POA: Diagnosis not present

## 2022-12-16 DIAGNOSIS — I959 Hypotension, unspecified: Secondary | ICD-10-CM | POA: Diagnosis not present

## 2022-12-16 DIAGNOSIS — Z125 Encounter for screening for malignant neoplasm of prostate: Secondary | ICD-10-CM | POA: Diagnosis not present

## 2022-12-16 DIAGNOSIS — I10 Essential (primary) hypertension: Secondary | ICD-10-CM | POA: Diagnosis not present

## 2022-12-16 MED FILL — Iron Sucrose Inj 20 MG/ML (Fe Equiv): INTRAVENOUS | Qty: 15 | Status: AC

## 2022-12-17 ENCOUNTER — Inpatient Hospital Stay: Payer: BC Managed Care – PPO

## 2022-12-17 VITALS — BP 118/60 | HR 55 | Temp 97.8°F | Resp 18

## 2022-12-17 DIAGNOSIS — D500 Iron deficiency anemia secondary to blood loss (chronic): Secondary | ICD-10-CM

## 2022-12-17 DIAGNOSIS — D696 Thrombocytopenia, unspecified: Secondary | ICD-10-CM | POA: Diagnosis not present

## 2022-12-17 DIAGNOSIS — D649 Anemia, unspecified: Secondary | ICD-10-CM | POA: Diagnosis not present

## 2022-12-17 DIAGNOSIS — Z79899 Other long term (current) drug therapy: Secondary | ICD-10-CM | POA: Diagnosis not present

## 2022-12-17 MED ORDER — SODIUM CHLORIDE 0.9 % IV SOLN: Freq: Once | INTRAVENOUS | Status: AC

## 2022-12-17 MED ORDER — SODIUM CHLORIDE 0.9 % IV SOLN
300.0000 mg | Freq: Once | INTRAVENOUS | Status: AC
  Administered 2022-12-17: 300 mg via INTRAVENOUS
  Filled 2022-12-17: qty 300

## 2022-12-17 MED ORDER — CETIRIZINE HCL 10 MG PO TABS
10.0000 mg | ORAL_TABLET | Freq: Once | ORAL | Status: AC
  Administered 2022-12-17: 10 mg via ORAL
  Filled 2022-12-17: qty 1

## 2022-12-17 NOTE — Patient Instructions (Signed)
[facility] [place]  Discharge Instructions: Thank you for choosing [facility] to provide your oncology and hematology care.  If you have a lab appointment with the Cancer Center - please note that after April 8th, 2024, all labs will be drawn in the cancer center.  You do not have to check in or register with the main entrance as you have in the past but will complete your check-in in the cancer center.  Wear comfortable clothing and clothing appropriate for easy access to any Portacath or PICC line.   We strive to give you quality time with your provider. You may need to reschedule your appointment if you arrive late (15 or more minutes).  Arriving late affects you and other patients whose appointments are after yours.  Also, if you miss three or more appointments without notifying the office, you may be dismissed from the clinic at the provider's discretion.      For prescription refill requests, have your pharmacy contact our office and allow 72 hours for refills to be completed.    Iron Sucrose Injection What is this medication? IRON SUCROSE (EYE ern SOO krose) treats low levels of iron (iron deficiency anemia) in people with kidney disease. Iron is a mineral that plays an important role in making red blood cells, which carry oxygen from your lungs to the rest of your body. This medicine may be used for other purposes; ask your health care provider or pharmacist if you have questions. COMMON BRAND NAME(S): Venofer What should I tell my care team before I take this medication? They need to know if you have any of these conditions: Anemia not caused by low iron levels Heart disease High levels of iron in the blood Kidney disease Liver disease An unusual or allergic reaction to iron, other medications, foods, dyes, or preservatives Pregnant or trying to get pregnant Breastfeeding How should I use this medication? This medication is for infusion into a vein. It  is given in a hospital or clinic setting. Talk to your care team about the use of this medication in children. While this medication may be prescribed for children as young as 2 years for selected conditions, precautions do apply. Overdosage: If you think you have taken too much of this medicine contact a poison control center or emergency room at once. NOTE: This medicine is only for you. Do not share this medicine with others. What if I miss a dose? Keep appointments for follow-up doses. It is important not to miss your dose. Call your care team if you are unable to keep an appointment. What may interact with this medication? Do not take this medication with any of the following: Deferoxamine Dimercaprol Other iron products This medication may also interact with the following: Chloramphenicol Deferasirox This list may not describe all possible interactions. Give your health care provider a list of all the medicines, herbs, non-prescription drugs, or dietary supplements you use. Also tell them if you smoke, drink alcohol, or use illegal drugs. Some items may interact with your medicine. What should I watch for while using this medication? Visit your care team regularly. Tell your care team if your symptoms do not start to get better or if they get worse. You may need blood work done while you are taking this medication. You may need to follow a special diet. Talk to your care team. Foods that contain iron include: whole grains/cereals, dried fruits, beans, or peas, leafy green vegetables, and organ meats (  liver, kidney). What side effects may I notice from receiving this medication? Side effects that you should report to your care team as soon as possible: Allergic reactions--skin rash, itching, hives, swelling of the face, lips, tongue, or throat Low blood pressure--dizziness, feeling faint or lightheaded, blurry vision Shortness of breath Side effects that usually do not require medical  attention (report to your care team if they continue or are bothersome): Flushing Headache Joint pain Muscle pain Nausea Pain, redness, or irritation at injection site This list may not describe all possible side effects. Call your doctor for medical advice about side effects. You may report side effects to FDA at 1-800-FDA-1088. Where should I keep my medication? This medication is given in a hospital or clinic and will not be stored at home. NOTE: This sheet is a summary. It may not cover all possible information. If you have questions about this medicine, talk to your doctor, pharmacist, or health care provider.  2024 Elsevier/Gold Standard (2021-12-30 00:00:00)    To help prevent nausea and vomiting after your treatment, we encourage you to take your nausea medication as directed.  BELOW ARE SYMPTOMS THAT SHOULD BE REPORTED IMMEDIATELY: *FEVER GREATER THAN 100.4 F (38 C) OR HIGHER *CHILLS OR SWEATING *NAUSEA AND VOMITING THAT IS NOT CONTROLLED WITH YOUR NAUSEA MEDICATION *UNUSUAL SHORTNESS OF BREATH *UNUSUAL BRUISING OR BLEEDING *URINARY PROBLEMS (pain or burning when urinating, or frequent urination) *BOWEL PROBLEMS (unusual diarrhea, constipation, pain near the anus) TENDERNESS IN MOUTH AND THROAT WITH OR WITHOUT PRESENCE OF ULCERS (sore throat, sores in mouth, or a toothache) UNUSUAL RASH, SWELLING OR PAIN  UNUSUAL VAGINAL DISCHARGE OR ITCHING   Items with * indicate a potential emergency and should be followed up as soon as possible or go to the Emergency Department if any problems should occur.  Please show the CHEMOTHERAPY ALERT CARD or IMMUNOTHERAPY ALERT CARD at check-in to the Emergency Department and triage nurse.  Should you have questions after your visit or need to cancel or reschedule your appointment, please contact [facility] [phone]  and follow the prompts.  Office hours are 8:00 a.m. to 4:30 p.m. Monday - Friday. Please note that  voicemails left after 4:00 p.m. may not be returned until the following business day.  We are closed weekends and major holidays. You have access to a nurse at all times for urgent questions. Please call the main number to the clinic [phone] and follow the prompts.  For any non-urgent questions, you may also contact your provider using MyChart. We now offer e-Visits for anyone 18 and older to request care online for non-urgent symptoms. For details visit [email or web address].   Also download the MyChart app! Go to the app store, search "MyChart", open the app, select [facility], and log in with your MyChart username and password.   

## 2022-12-17 NOTE — Progress Notes (Signed)
Patient presents today for iron infusion.  Patient is in satisfactory condition with no new complaints voiced.  Vital signs are stable.  IV placed in R arm.  IV flushed well with good blood return noted.  We will proceed with infusion per provider orders.    Patient tolerated infusion well with no complaints voiced.  Patient left ambulatory in stable condition.  Vital signs stable at discharge.  Follow up as scheduled.

## 2022-12-20 DIAGNOSIS — E1165 Type 2 diabetes mellitus with hyperglycemia: Secondary | ICD-10-CM | POA: Diagnosis not present

## 2022-12-20 DIAGNOSIS — I10 Essential (primary) hypertension: Secondary | ICD-10-CM | POA: Diagnosis not present

## 2022-12-20 DIAGNOSIS — R42 Dizziness and giddiness: Secondary | ICD-10-CM | POA: Diagnosis not present

## 2022-12-20 DIAGNOSIS — R112 Nausea with vomiting, unspecified: Secondary | ICD-10-CM | POA: Diagnosis not present

## 2022-12-20 DIAGNOSIS — F1010 Alcohol abuse, uncomplicated: Secondary | ICD-10-CM | POA: Diagnosis not present

## 2022-12-20 DIAGNOSIS — G4700 Insomnia, unspecified: Secondary | ICD-10-CM | POA: Diagnosis not present

## 2022-12-21 ENCOUNTER — Encounter: Payer: Self-pay | Admitting: Gastroenterology

## 2022-12-21 ENCOUNTER — Ambulatory Visit (INDEPENDENT_AMBULATORY_CARE_PROVIDER_SITE_OTHER): Payer: BC Managed Care – PPO | Admitting: Gastroenterology

## 2022-12-21 VITALS — BP 113/72 | HR 59 | Temp 97.8°F | Ht 66.0 in | Wt 173.4 lb

## 2022-12-21 DIAGNOSIS — D649 Anemia, unspecified: Secondary | ICD-10-CM | POA: Diagnosis not present

## 2022-12-21 DIAGNOSIS — R112 Nausea with vomiting, unspecified: Secondary | ICD-10-CM | POA: Diagnosis not present

## 2022-12-21 DIAGNOSIS — K7031 Alcoholic cirrhosis of liver with ascites: Secondary | ICD-10-CM | POA: Diagnosis not present

## 2022-12-21 DIAGNOSIS — K219 Gastro-esophageal reflux disease without esophagitis: Secondary | ICD-10-CM

## 2022-12-21 MED ORDER — ONDANSETRON 4 MG PO TBDP: 4.0000 mg | ORAL_TABLET | Freq: Three times a day (TID) | ORAL | 1 refills | Status: DC | PRN

## 2022-12-21 NOTE — Patient Instructions (Addendum)
Please reach out to PCP to see if they can perform an EKG to assess Qtc interval.   Medications: Continue pantoprazole 40 mg twice daily Continue lactulose 15 mL twice daily.  To help avoid any nausea or vomiting related to this he may take after meal versus before. Continue Xifaxan 550 mg twice daily Continue thiamine and folic acid daily Continue daily iron therapy.  Continue Coreg 3.125 mg twice daily Continue midodrine 5 mg 3 times daily Continue Lasix 40 mg once daily Continue spironolactone 50 mg once daily Continue Zofran 4 mg daily as needed.  If your EKG is okay you can take 8 mg once daily in the morning for significant nausea and vomiting.  When nauseous please follow a bland diet.  Please continue to take protein supplements daily.  If you are very nauseous in the morning if you only eat 2 meals throughout the day then I would recommend at least 1 protein supplement nightly.  Please monitor for any recurrent swelling in your belly or your lower extremities and notify me immediately if that occurs.  Continue to follow with hematology for your iron infusions.  Keep your follow-up with [facility] to discuss candidacy for liver transplant.  It was a pleasure to see you today. I want to create trusting relationships with patients. If you receive a survey regarding your visit,  I greatly appreciate you taking time to fill this out on paper or through your MyChart. I value your feedback.  Brooke Bonito, MSN, FNP-BC, AGACNP-BC [facility]

## 2022-12-21 NOTE — Progress Notes (Signed)
GI Office Note    Referring Provider: [name], MD Primary Care Physician:  [name], MD Primary Gastroenterologist: Hennie Duos. Marletta Lor, DO  Date:  12/21/2022  ID:  Shawn Velazquez, DOB April 04, 1966, MRN [id number]   Chief Complaint   Chief Complaint  Patient presents with   Follow-up    Patient here today for a follow up on cirrhosis.Patient had labs done 12/16/2022 with [facility]. Patient says he did not sleep well last night, and he finally got up and felt nauseated and took zofran. He is feeling a little better now.    History of Present Illness  Shawn Velazquez is a 57 y.o. male with a history of alcoholic liver cirrhosis, alcohol abuse, sleep apnea, diabetes, HTN presenting today with complaint of vomiting and fear of variceal bands coming off presenting today for hospital follow-up symptomatic hypotension  MRI liver with and without contrast 08/03/2022: -Chronic liver disease -Macronodular liver with multiple presumed regenerative nodules -No clear aggressive appearing lesions with restricted diffusion -Mild ascites -Varices on the stomach and lower esophagus -Mild splenic enlargement   Hospitalized at [facility] 08/26/2022 through 08/29/2022: Presented with hematemesis and melena for 3 days.  Noted to have previous admission at the end of January for anasarca.  He underwent EGD for hematemesis and melena during this hospitalization in February.  Procedure outlined below.  He was treated with octreotide and PPI infusion as well as IV Rocephin for SBP prophylaxis.  He was also treated with Carafate 1 g 4 times daily.  His MELD was 27 on admission with a DF of 45.9.  Was treated with prednisolone during his prior hospitalization in January.  Patient has reported intermittent drowsiness and confusion concerning for possible hepatic encephalopathy but has been maintained on lactulose.  Given significant bowel movements while inpatient his lactulose was decreased from 3 times daily to  twice daily.  Given he was still having alcohol use prednisolone was not started.  Prior to discharge he was advised to continue Lasix 20 mg daily 100 mg spironolactone daily.  His MELD 3.0 was 22 on discharge.  Per Dr. Levon Hedger, no need for Xifaxan at this time.  Alcohol cessation encouraged. Recommendations on discharge were to continue Carafate for 2 weeks, PPI twice daily, Lasix 40 mg daily, spironolactone 100 mg daily, lactulose 2-3 bowel movements daily, carvedilol 3.125 mg twice daily.  Need to arrange outpatient evaluation at transplant center given elevated levels.   EGD 08/26/2022: -grade 2 esophageal varices s/p banding x 3 with complete eradication, without bleeding -Single nonbleeding angiodysplastic lesion in the duodenum.  -Advised Carafate suspension 1 g before meals and nightly -Repeat EGD in 4-8 weeks.   Recent hospitalization 09/24/22-09/28/22.  He presented to the ED with hematemesis, having multiple episodes.  Reportedly continued to drink alcohol.  Reports some mild tremors and intermittent confusion but no worse than previous.  Complaining of lower extremity edema and abdominal distention.  History of Rocephin for SBP prophylaxis.  Underwent EGD with Dr. Jena Gauss.  IV twice daily and octreotide infusion.  Continued on lactulose as well as daily diuretic regimen of 40 mg of Lasix and 100 mg of spironolactone.  EGD as outlined below.  Was given vitamin K x 1 dose. Discharged on lasix 40 mg daily and spironolactone 150mg  daily (due to hypokalemia).  Recommended restarting Coreg 3.125 mg twice daily.   EGD 09/24/22: -4 columns of grade 2-3 esophageal varices, cherry red spots 1 on each of 2 columns without active bleeding -  Portal hypertensive gastropathy with touch friability, no gastric varices -Microvasive 7 shot Bander patch -1 band placed on each column with good hemostasis -Advise repeat banding in 4 weeks. -Continue PPI   Office visit 10/14/22. Advised to continue current  medications including Lasix 40 mg daily, spironolactone 150 mg daily, carvedilol 3.125 twice daily, lactulose, and PPI twice daily.  Start on Xifaxan 550 mg twice daily.  Advised he may use Gas-X as needed for bloating.  Due for liver imaging in July 2024.  Advised to proceed with EGD that was already scheduled. EGD cancelled given patient lost insurance 10/14/22.    Office visit 11/04/22. Increased N/V. Swelling fairly well controlled. Abdomen tight at times. Eating very little. Poor appetite. Was unsure if taking Xifaxin. 1-2 BM daily. Lactulose twice daily. Insurance reinstated. Advised labs and reschedule EGD. Start Xifaxin. Continue lasix, spironolactone (150) carvedilol 3.125 mg BID. Low sodium diet. Start ensures and follow up with [facility] regarding possible transplant candidacy.    Contacted by heme/onc PA 5/31 regarding patient with dizziness and shortness of breath who presented for iron infusion. He was given 500 cc bolus of NS and CBC checked. Hgb 10.6, plts 174. Last ferritin was 19 (11/11/22).  I was asked for recommendations for treatment and recommended decreasing spironolactone from 150mg  to 100 mg daily given BP was a little soft and asked to follow up in the office. His BP improved to 123/72 after fluids.Tolerated infusion without issue.   Last office visit 12/07/22. BP has been on the lower side.  Stated it was fine all weekend.  Continues to have issues with insomnia.  Not yet taking his morning medications and not only had a little bit of water.  Not feeling dizzy.  Does feel sleepy.  Swelling has been controlled and denies any abdominal pain.  Taking Xifaxan as directed.  Nausea has been fairly well-controlled with Zofran.  Referral for the last couple weeks his BP has been on the lower side steadily decreasing but overall he has been feeling better than usual and no worsening confusion.  Appetite also improved.  Multiple rechecks of his blood pressure revealed ongoing hypotension  therefore he was advised to go to the ED for fluids and further evaluation.  Tried to discuss with hematology however they stated they were unable to accommodate him for fluid administration without going through the ED.  Given his blood pressure he was advised to stop carvedilol.  Hospitalization at [facility] 12/07/22 through 12/08/22.  He is orthostatic BPs were positive and he became mildly symptomatic.  He received IV hydration with fluid boluses.  Labs with AST 53, ALT 20, alk phos 131, T. bili 3.6 with indirect 2.7 and direct 0.9.  Albumin 2.3.  Sodium noted to be low at 126, potassium 3.9, and creatinine 0.83.  INR elevated at 1.9.  He was also noted to be bradycardic.  Initially held his Aldactone, Lasix, and Coreg.  He received IV fluids and albumin and was given midodrine.  He was advised to restart his Coreg on 6/6 and reduce his spironolactone to 50 mg daily starting on 6/6.  Advised to restart Lasix on Saturday 6/8 and continue to monitor BP at home.  Reportedly scheduled for iron infusion with hematology on 6/7.  Today:  Has been nauseas and has not been sleeping well the last few days. Having some dizziness but no falls. Vomiting actively during visit today with some mild yellow bile acid contents. NO medications seen. Trying to stay hydrated at home.  Does not feel like eating much early in the mornings. Has been drinking a protein shakes usually once daily or substituting for a meal.   Takes lactulose before he eats breakfast. Has bene complaint with PPI. Tries to take his lactulose 10-15 minutes prior to eating. Takes iron at night time.   Has not had any fluid accumulation in the last week. Taking spironolactone 50 mg daily and lasix 40 mg daily. Taking zofran as needed and started back coreg and also taking midodrine.   Had iron infusions weekly for the last 3 weeks and will have another one this Friday and then have labs rechecked. Iron infusions do seem to make him feel better.   MELD 3.0:  23 at 12/08/2022  5:08 AM MELD-Na: 24 at 12/08/2022  5:08 AM Calculated from: Serum Creatinine: 0.79 mg/dL (Using min of 1 mg/dL) at 07/10/1094  0:45 AM Serum Sodium: 130 mmol/L at 12/08/2022  5:08 AM Total Bilirubin: 3.7 mg/dL at 4/0/9811  9:14 AM Serum Albumin: 2.6 g/dL at 01/10/2955  2:13 AM INR(ratio): 1.9 at 12/08/2022  5:08 AM Age at listing (hypothetical): 56 years Sex: Male at 12/08/2022  5:08 AM  Wt Readings from Last 3 Encounters:  12/21/22 173 lb 6.4 oz (78.7 kg)  12/07/22 177 lb 7.5 oz (80.5 kg)  12/07/22 174 lb 11.2 oz (79.2 kg)     Current Outpatient Medications  Medication Sig Dispense Refill   albuterol (VENTOLIN HFA) 108 (90 Base) MCG/ACT inhaler Inhale 2 puffs into the lungs every 6 (six) hours as needed for wheezing or shortness of breath. 18 g 0   busPIRone (BUSPAR) 5 MG tablet Take 1 tablet (5 mg total) by mouth 2 (two) times daily. 60 tablet 2   carvedilol (COREG) 3.125 MG tablet Take 1 tablet (3.125 mg total) by mouth 2 (two) times daily with a meal. 60 tablet 2   Continuous Blood Gluc Sensor (DEXCOM G7 SENSOR) MISC Inject 1 application. into the skin as directed. Change sensor every 10 days as directed. 6 each 3   ferrous sulfate 324 MG TBEC Take 1 tablet (324 mg total) by mouth daily with breakfast. 30 tablet 3   FLUoxetine (PROZAC) 10 MG capsule TAKE 4 CAPSULES BY MOUTH EVERY DAY (Patient taking differently: Take 10 mg by mouth See admin instructions. TAKE 2 CAPSULES IN THE MORNING AND 2 CAPSULES EVERY EVENING.) 260 capsule 0   folic acid (FOLVITE) 1 MG tablet TAKE 1 TABLET BY MOUTH DAILY 30 tablet 0   furosemide (LASIX) 40 MG tablet Take 1 tablet (40 mg total) by mouth daily. 30 tablet 2   gabapentin (NEURONTIN) 100 MG capsule Take 1 tablet by mouth daily.     glucose blood test strip Use as instructed to monitor glucose 4 times daily 100 each 12   insulin glargine (LANTUS SOLOSTAR) 100 UNIT/ML Solostar Pen Inject 30 Units into the skin at bedtime. 30 mL 3   insulin lispro  (HUMALOG KWIKPEN) 100 UNIT/ML KwikPen Inject 10-16 Units into the skin 3 (three) times daily. 40 mL 3   Insulin Pen Needle (PEN NEEDLES) 32G X 4 MM MISC 1 each by Does not apply route in the morning, at noon, in the evening, and at bedtime. Use to inject insulin 4 times daily 200 each 3   lactulose (CHRONULAC) 10 GM/15ML solution Take 15 mLs (10 g total) by mouth daily. 473 mL 1   levocetirizine (XYZAL) 5 MG tablet Take 1 tablet by mouth daily.     midodrine (PROAMATINE) 5  MG tablet Take 1 tablet (5 mg total) by mouth 3 (three) times daily with meals. To help Prevent Low Blood Pressure 90 tablet 3   Multiple Vitamin (MULTIVITAMIN) tablet Take 1 tablet by mouth daily.     ondansetron (ZOFRAN-ODT) 4 MG disintegrating tablet Take 1 tablet (4 mg total) by mouth every 8 (eight) hours as needed for nausea or vomiting. 30 tablet 1   pantoprazole (PROTONIX) 40 MG tablet Take 1 tablet (40 mg total) by mouth 2 (two) times daily. 60 tablet 5   rifaximin (XIFAXAN) 550 MG TABS tablet Take 1 tablet (550 mg total) by mouth 2 (two) times daily. 60 tablet 2   spironolactone (ALDACTONE) 50 MG tablet Take 1 tablet (50 mg total) by mouth daily. 90 tablet 3   thiamine (VITAMIN B-1) 100 MG tablet Take 1 tablet (100 mg total) by mouth daily. 30 tablet 3   No current facility-administered medications for this visit.    Past Medical History:  Diagnosis Date   Anxiety    Arthritis    affects hands, shoulder, neck, knees, hips, ankles, toes   Chronic back pain    Diabetes mellitus    diet controlled   Eczema    Fatty liver, alcoholic    GERD (gastroesophageal reflux disease)    Glaucoma    slight case   Hypertension    Iron deficiency anemia due to chronic blood loss 11/25/2022   Psoriasis    Sleep apnea    Substance abuse (HCC)    Vertigo 09/11/2017   Wears glasses     Past Surgical History:  Procedure Laterality Date   COLONOSCOPY WITH PROPOFOL N/A 04/05/2022   Procedure: COLONOSCOPY WITH PROPOFOL;   Surgeon: Lanelle Bal, DO;  Location: [facility];  Service: Endoscopy;  Laterality: N/A;  10:45 AM,unable to reach pt to move up   ESOPHAGEAL BANDING N/A 08/26/2022   Procedure: ESOPHAGEAL BANDING;  Surgeon: Lanelle Bal, DO;  Location: [facility];  Service: Endoscopy;  Laterality: N/A;   ESOPHAGEAL BANDING  09/24/2022   Procedure: ESOPHAGEAL BANDING;  Surgeon: Corbin Ade, MD;  Location: [facility];  Service: Endoscopy;;   ESOPHAGOGASTRODUODENOSCOPY (EGD) WITH PROPOFOL N/A 08/26/2022   Procedure: ESOPHAGOGASTRODUODENOSCOPY (EGD) WITH PROPOFOL;  Surgeon: Lanelle Bal, DO;  [facility]: [facility];  Service: Endoscopy;  Laterality: N/A;   ESOPHAGOGASTRODUODENOSCOPY (EGD) WITH PROPOFOL N/A 09/24/2022   Procedure: ESOPHAGOGASTRODUODENOSCOPY (EGD) WITH PROPOFOL;  Surgeon: Corbin Ade, MD;  Location: [facility];  Service: Endoscopy;  Laterality: N/A;  hematemesis, esophageal varices, melena   MULTIPLE TOOTH EXTRACTIONS     POLYPECTOMY  04/05/2022   Procedure: POLYPECTOMY;  Surgeon: Lanelle Bal, DO;  Location: [facility];  Service: Endoscopy;;   POSTERIOR CERVICAL LAMINECTOMY  04/21/2012   Procedure: POSTERIOR CERVICAL LAMINECTOMY;  Surgeon: Mariam Dollar, MD;  Location: [facility];  Service: Neurosurgery;  Laterality: Left;  Posterior Cervical laminectomy/foraminotomy and diskectomy, left cervical seven-thoracic one   ROTATOR CUFF REPAIR     THORACIC DISCECTOMY Left 04/03/2014   Procedure: Left Thoracic Seven to Eight, Thoracic Eight to Nine Thoracic Discectomy ;  Surgeon: Mariam Dollar, MD;  Location: [facility];  Service: Neurosurgery;  Laterality: Left;    Family History  Problem Relation Age of Onset   Diabetes Father    Cancer - Lung Father    Hypertension Other    Cancer - Lung Other     Allergies as of 12/21/2022 - Review Complete 12/21/2022  Allergen Reaction Noted   Neosporin + pain relief max st [neomy-bacit-polymyx-pramoxine] Rash 04/19/2012     Social History   Socioeconomic History   Marital status: Divorced    Spouse name: Not on file   Number of children: Not on file   Years of education: Not on file   Highest education level: Not on file  Occupational History    Comment: Truck to storage  Tobacco Use   Smoking status: Every Day    Packs/day: 0.25    Years: 40.00    Additional pack years: 0.00    Total pack years: 10.00    Types: Cigarettes    Last attempt to quit: 05/09/2022    Years since quitting: 0.6   Smokeless tobacco: Never   Tobacco comments:    pt trying to quit on his own  Vaping Use   Vaping Use: Former  Substance and Sexual Activity   Alcohol use: Not Currently    Alcohol/week: 12.0 standard drinks of alcohol    Types: 12 Cans of beer per week    Comment: last intake of any alcohol in May 2024 /Either Smirnoff Smash-4 per day or Fireball-10 airplane bottles in a day   Drug use: No   Sexual activity: Yes    Birth control/protection: None  Other Topics Concern   Not on file  Social History Narrative   Not on file   Social Determinants of Health   Financial Resource Strain: Medium Risk (10/07/2022)   Overall Financial Resource Strain (CARDIA)    Difficulty of Paying Living Expenses: Somewhat hard  Food Insecurity: No Food Insecurity (12/08/2022)   Hunger Vital Sign    Worried About Running Out of Food in the Last Year: Never true    Ran Out of Food in the Last Year: Never true  Transportation Needs: No Transportation Needs (12/08/2022)   PRAPARE - Administrator, Civil Service (Medical): No    Lack of Transportation (Non-Medical): No  Physical Activity: Not on file  Stress: Stress Concern Present (10/07/2022)   [employer] of Occupational Health - Occupational Stress Questionnaire    Feeling of Stress : To some extent  Social Connections: Not on file   Review of Systems   Gen: + fatigue, weakness. Denies fever, chills, anorexia. Denies weight loss.  CV: Denies chest pain,  palpitations, syncope, peripheral edema, and claudication. Resp: Denies dyspnea at rest, cough, wheezing, coughing up blood, and pleurisy. GI: See HPI Derm: Denies rash, itching, dry skin Psych: + anxiety, confusion. Denies depression,  memory loss. No homicidal or suicidal ideation.  Heme: Denies bruising, bleeding, and enlarged lymph nodes.  Physical Exam   BP 113/72 (BP Location: Left Arm, Patient Position: Sitting, Cuff Size: Normal)   Pulse (!) 59   Temp 97.8 F (36.6 C) (Temporal)   Ht 5\' 6"  (1.676 m)   Wt 173 lb 6.4 oz (78.7 kg)   BMI 27.99 kg/m   General:   Alert and oriented. No distress noted. Pleasant and cooperative.  Head:  Normocephalic and atraumatic. Eyes:  Conjuctiva clear without scleral icterus. Mouth:  Oral mucosa pink and moist. Good dentition. No lesions. Lungs:  Clear to auscultation bilaterally. No wheezes, rales, or rhonchi. No distress.  Heart:  S1, S2 present without murmurs appreciated.  Abdomen:  +BS, soft, non-tender and non-distended. No rebound or guarding. No HSM or masses noted. Rectal: deferred Msk:  Symmetrical without gross deformities. Normal posture. Extremities:  Without edema bilaterally.  Neurologic:  Alert and  oriented x4 Psych:  Alert and cooperative. Normal mood and affect.   Assessment  Shawn Velazquez is a 57 y.o. male with a history of alcoholic liver cirrhosis, alcohol abuse, sleep apnea, diabetes, HTN presenting today with complaint of vomiting and fear of variceal bands coming off presenting today for hospital follow-up.  Decompensated alcoholic cirrhosis: Course complicated by recurrent esophageal variceal bleeding and banding.  Is in need of surveillance EGD with plans to complete previously, will address at follow-up.  Overall appetite is good however does note some nausea and vomiting as stated below.  He has had some fluctuations in his weight.  Presented to last visit 6/4 with hypotension and was advised to present to the  ED for IV fluids.  He also received albumin and his diuretics and Coreg were held for 24 hours.  He was discharged on midodrine.  Aldactone was decreased from 100 mg daily to 50 mg daily.  Lasix resumed at normal dose of 40 mg daily.  Coreg restarted at 3.125 mg daily a couple days after hospitalization after he was placed on midodrine 3 times daily.  Currently his blood pressure is remaining stable and his heart rate is remaining within normal range.  Hypotension was likely in the setting of overdiuresis with beta-blocker.  Anemia: Continues to follow with hematology for IV iron infusions.  He is due to have another iron infusion this Friday and then will have repeat blood work.  His hemoglobin during most recent admission was improved up to 10.6 and was 9.4 on discharge likely somewhat due to hemodilution given IV fluids.  He denies any melena or BRBPR.  Does continue to have some mild dizziness and has chronic fatigue secondary to liver disease.  GERD, N/V: Overall GERD symptoms well-controlled with PPI twice daily however he does have chronic intermittent nausea and vomiting.  He was actively vomiting during visit today.  This all tends to be worse when he is not sleeping well.  Has been using Zofran to help and sometimes he still throws up some of his medication but this always tends to occur in the morning time.  Advised to space out morning medications and ensure that he is nauseous in the mornings that he should take Zofran and then wait 20-30 minutes prior to taking PPI.  Advised him to take PPI on empty stomach and then take the rest of his medications and to trial taking lactulose after meals instead of before meals to avoid nausea.  Can reassess cause of nausea and vomiting with repeat upper endoscopy which will be discussed at next visit.  PLAN   Continue Coreg 3.125 mg twice daily and midodrine 5 mg 3 times daily Continue Zofran as needed.  Continue with Lasix 40 mg daily Continue  spironolactone 50 mg daily Follow-up with [facility] for transplant evaluation 2 g sodium diet Ongoing alcohol cessation Continue PPI and lactulose Continue Xifaxan 550 mg twice daily Continue thiamine and folic acid Continue daily iron therapy.  EKG for Qtc interval Continue to follow with hematology for iron infusions.  Drink 1-2 Ensure daily. Follow up in 2 months - discuss surveillance EGD for varices.     Brooke Bonito, MSN, FNP-BC, AGACNP-BC [facility]

## 2022-12-23 ENCOUNTER — Ambulatory Visit (INDEPENDENT_AMBULATORY_CARE_PROVIDER_SITE_OTHER): Payer: BC Managed Care – PPO | Admitting: Nurse Practitioner

## 2022-12-23 ENCOUNTER — Encounter: Payer: Self-pay | Admitting: Nurse Practitioner

## 2022-12-23 VITALS — BP 100/60 | HR 56 | Ht 66.0 in | Wt 178.8 lb

## 2022-12-23 DIAGNOSIS — E1165 Type 2 diabetes mellitus with hyperglycemia: Secondary | ICD-10-CM | POA: Diagnosis not present

## 2022-12-23 DIAGNOSIS — I10 Essential (primary) hypertension: Secondary | ICD-10-CM

## 2022-12-23 DIAGNOSIS — Z794 Long term (current) use of insulin: Secondary | ICD-10-CM

## 2022-12-23 DIAGNOSIS — Z7984 Long term (current) use of oral hypoglycemic drugs: Secondary | ICD-10-CM

## 2022-12-23 DIAGNOSIS — E782 Mixed hyperlipidemia: Secondary | ICD-10-CM

## 2022-12-23 MED ORDER — DEXCOM G7 SENSOR MISC: 1.0000 "application " | 3 refills | Status: DC

## 2022-12-23 MED ORDER — LANTUS SOLOSTAR 100 UNIT/ML ~~LOC~~ SOPN: 30.0000 [IU] | PEN_INJECTOR | Freq: Every day | SUBCUTANEOUS | 3 refills | Status: DC

## 2022-12-23 MED ORDER — INSULIN LISPRO (1 UNIT DIAL) 100 UNIT/ML (KWIKPEN): 5.0000 [IU] | PEN_INJECTOR | Freq: Three times a day (TID) | SUBCUTANEOUS | 3 refills | Status: DC

## 2022-12-23 NOTE — Progress Notes (Signed)
Endocrinology Follow Up Note       12/23/2022, 11:50 AM   Subjective:    Patient ID: Shawn Velazquez, male    DOB: 01-05-1966.  Shawn Velazquez is being seen in follow up after being seen in consultation for management of currently uncontrolled symptomatic diabetes requested by  [name], MD.   Past Medical History:  Diagnosis Date   Anxiety    Arthritis    affects hands, shoulder, neck, knees, hips, ankles, toes   Chronic back pain    Diabetes mellitus    diet controlled   Eczema    Fatty liver, alcoholic    GERD (gastroesophageal reflux disease)    Glaucoma    slight case   Hypertension    Iron deficiency anemia due to chronic blood loss 11/25/2022   Psoriasis    Sleep apnea    Substance abuse (HCC)    Vertigo 09/11/2017   Wears glasses     Past Surgical History:  Procedure Laterality Date   COLONOSCOPY WITH PROPOFOL N/A 04/05/2022   Procedure: COLONOSCOPY WITH PROPOFOL;  Surgeon: Lanelle Bal, DO;  [facility];  Service: Endoscopy;  Laterality: N/A;  10:45 AM,unable to reach pt to move up   ESOPHAGEAL BANDING N/A 08/26/2022   Procedure: ESOPHAGEAL BANDING;  Surgeon: Lanelle Bal, DO;  [facility];  Service: Endoscopy;  Laterality: N/A;   ESOPHAGEAL BANDING  09/24/2022   Procedure: ESOPHAGEAL BANDING;  Surgeon: Corbin Ade, MD;  [facility];  Service: Endoscopy;;   ESOPHAGOGASTRODUODENOSCOPY (EGD) WITH PROPOFOL N/A 08/26/2022   Procedure: ESOPHAGOGASTRODUODENOSCOPY (EGD) WITH PROPOFOL;  Surgeon: Lanelle Bal, DO;  [facility];  Service: Endoscopy;  Laterality: N/A;   ESOPHAGOGASTRODUODENOSCOPY (EGD) WITH PROPOFOL N/A 09/24/2022   Procedure: ESOPHAGOGASTRODUODENOSCOPY (EGD) WITH PROPOFOL;  Surgeon: Corbin Ade, MD;  [facility];  Service: Endoscopy;  Laterality: N/A;  hematemesis, esophageal varices, melena    MULTIPLE TOOTH EXTRACTIONS     POLYPECTOMY  04/05/2022   Procedure: POLYPECTOMY;  Surgeon: Lanelle Bal, DO;  [facility];  Service: Endoscopy;;   POSTERIOR CERVICAL LAMINECTOMY  04/21/2012   Procedure: POSTERIOR CERVICAL LAMINECTOMY;  Surgeon: Mariam Dollar, MD;  [facility];  Service: Neurosurgery;  Laterality: Left;  Posterior Cervical laminectomy/foraminotomy and diskectomy, left cervical seven-thoracic one   ROTATOR CUFF REPAIR     THORACIC DISCECTOMY Left 04/03/2014   Procedure: Left Thoracic Seven to Eight, Thoracic Eight to Nine Thoracic Discectomy ;  Surgeon: Mariam Dollar, MD;  Location: [facility];  Service: Neurosurgery;  Laterality: Left;    Social History   Socioeconomic History   Marital status: Divorced    Spouse name: Not on file   Number of children: Not on file   Years of education: Not on file   Highest education level: Not on file  Occupational History    Comment: Truck to storage  Tobacco Use   Smoking status: Every Day    Packs/day: 0.25    Years: 40.00    Additional pack years: 0.00    Total pack years: 10.00    Types: Cigarettes  Last attempt to quit: 05/09/2022    Years since quitting: 0.6   Smokeless tobacco: Never   Tobacco comments:    pt trying to quit on his own  Vaping Use   Vaping Use: Former  Substance and Sexual Activity   Alcohol use: Not Currently    Alcohol/week: 12.0 standard drinks of alcohol    Types: 12 Cans of beer per week    Comment: last intake of any alcohol in May 2024 /Either Smirnoff Smash-4 per day or Fireball-10 airplane bottles in a day   Drug use: No   Sexual activity: Yes    Birth control/protection: None  Other Topics Concern   Not on file  Social History Narrative   Not on file   Social Determinants of Health   Financial Resource Strain: Medium Risk (10/07/2022)   Overall Financial Resource Strain (CARDIA)    Difficulty of Paying Living Expenses: Somewhat hard  Food Insecurity: No Food  Insecurity (12/08/2022)   Hunger Vital Sign    Worried About Running Out of Food in the Last Year: Never true    Ran Out of Food in the Last Year: Never true  Transportation Needs: No Transportation Needs (12/08/2022)   PRAPARE - Administrator, Civil Service (Medical): No    Lack of Transportation (Non-Medical): No  Physical Activity: Not on file  Stress: Stress Concern Present (10/07/2022)   [employer] of Occupational Health - Occupational Stress Questionnaire    Feeling of Stress : To some extent  Social Connections: Not on file    Family History  Problem Relation Age of Onset   Diabetes Father    Cancer - Lung Father    Hypertension Other    Cancer - Lung Other     Outpatient Encounter Medications as of 12/23/2022  Medication Sig   albuterol (VENTOLIN HFA) 108 (90 Base) MCG/ACT inhaler Inhale 2 puffs into the lungs every 6 (six) hours as needed for wheezing or shortness of breath.   busPIRone (BUSPAR) 5 MG tablet Take 1 tablet (5 mg total) by mouth 2 (two) times daily.   carvedilol (COREG) 3.125 MG tablet Take 1 tablet (3.125 mg total) by mouth 2 (two) times daily with a meal.   ferrous sulfate 324 MG TBEC Take 1 tablet (324 mg total) by mouth daily with breakfast.   FLUoxetine (PROZAC) 10 MG capsule TAKE 4 CAPSULES BY MOUTH EVERY DAY (Patient taking differently: Take 10 mg by mouth See admin instructions. TAKE 2 CAPSULES IN THE MORNING AND 2 CAPSULES EVERY EVENING.)   folic acid (FOLVITE) 1 MG tablet TAKE 1 TABLET BY MOUTH DAILY   furosemide (LASIX) 40 MG tablet Take 1 tablet (40 mg total) by mouth daily.   gabapentin (NEURONTIN) 100 MG capsule Take 1 tablet by mouth daily.   glucose blood test strip Use as instructed to monitor glucose 4 times daily   Insulin Pen Needle (PEN NEEDLES) 32G X 4 MM MISC 1 each by Does not apply route in the morning, at noon, in the evening, and at bedtime. Use to inject insulin 4 times daily   lactulose (CHRONULAC) 10 GM/15ML solution  Take 15 mLs (10 g total) by mouth daily.   levocetirizine (XYZAL) 5 MG tablet Take 1 tablet by mouth daily.   midodrine (PROAMATINE) 5 MG tablet Take 1 tablet (5 mg total) by mouth 3 (three) times daily with meals. To help Prevent Low Blood Pressure   Multiple Vitamin (MULTIVITAMIN) tablet Take 1 tablet by mouth daily.  ondansetron (ZOFRAN-ODT) 4 MG disintegrating tablet Take 1 tablet (4 mg total) by mouth every 8 (eight) hours as needed for nausea or vomiting.   pantoprazole (PROTONIX) 40 MG tablet Take 1 tablet (40 mg total) by mouth 2 (two) times daily.   rifaximin (XIFAXAN) 550 MG TABS tablet Take 1 tablet (550 mg total) by mouth 2 (two) times daily.   spironolactone (ALDACTONE) 50 MG tablet Take 1 tablet (50 mg total) by mouth daily.   thiamine (VITAMIN B-1) 100 MG tablet Take 1 tablet (100 mg total) by mouth daily.   [DISCONTINUED] Continuous Blood Gluc Sensor (DEXCOM G7 SENSOR) MISC Inject 1 application. into the skin as directed. Change sensor every 10 days as directed.   [DISCONTINUED] insulin glargine (LANTUS SOLOSTAR) 100 UNIT/ML Solostar Pen Inject 30 Units into the skin at bedtime.   [DISCONTINUED] insulin lispro (HUMALOG KWIKPEN) 100 UNIT/ML KwikPen Inject 10-16 Units into the skin 3 (three) times daily.   Continuous Glucose Sensor (DEXCOM G7 SENSOR) MISC Inject 1 application  into the skin as directed. Change sensor every 10 days as directed.   insulin glargine (LANTUS SOLOSTAR) 100 UNIT/ML Solostar Pen Inject 30 Units into the skin at bedtime.   insulin lispro (HUMALOG KWIKPEN) 100 UNIT/ML KwikPen Inject 5-11 Units into the skin 3 (three) times daily.   No facility-administered encounter medications on file as of 12/23/2022.    ALLERGIES: Allergies  Allergen Reactions   Neosporin + Pain Relief Max St [Neomy-Bacit-Polymyx-Pramoxine] Rash    VACCINATION STATUS: Immunization History  Administered Date(s) Administered   Influenza,inj,Quad PF,6+ Mos 07/31/2021   Moderna  Sars-Covid-2 Vaccination 01/09/2020, 02/06/2020   Tdap 08/06/2021   Zoster Recombinat (Shingrix) 07/31/2021, 10/29/2021    Diabetes He presents for his follow-up diabetic visit. He has type 2 diabetes mellitus. Onset time: was diagnosed at approx age of 50. His disease course has been fluctuating. There are no hypoglycemic associated symptoms. Associated symptoms include blurred vision, fatigue, polydipsia and polyuria. Pertinent negatives for diabetes include no weight loss. There are no hypoglycemic complications. Symptoms are improving. Diabetic complications include nephropathy. Risk factors for coronary artery disease include diabetes mellitus, dyslipidemia, hypertension, male sex, obesity and sedentary lifestyle. Current diabetic treatment includes oral agent (monotherapy) and intensive insulin program. He is compliant with treatment most of the time. His weight is fluctuating minimally. He is following a generally unhealthy diet. When asked about meal planning, he reported none. He has not had a previous visit with a dietitian. He rarely participates in exercise. His home blood glucose trend is fluctuating dramatically. His overall blood glucose range is >200 mg/dl. (He presents today with his CGM and meter showing gross hyperglycemia overall.  His most recent A1c was 7.6% on 6/14, increasing from last visit of 6.9%.  His insulin doses were recently reduced after he had a hypoglycemic event during GI follow up sending him to the ED for evaluation.  Analysis of his CGM shows TIR 13%, TAR 87%, TBR 0% with a GMI of 9.9%.  He was diagnosed with cirrhosis between visits and was also taken off his Metformin.) An ACE inhibitor/angiotensin II receptor blocker is being taken. He does not see a podiatrist.Eye exam is current.  Hyperlipidemia This is a chronic problem. The current episode started more than 1 year ago. The problem is controlled. Recent lipid tests were reviewed and are normal. Exacerbating  diseases include chronic renal disease, diabetes and obesity. Factors aggravating his hyperlipidemia include thiazides and fatty foods. Current antihyperlipidemic treatment includes statins. The current treatment provides  mild improvement of lipids. Compliance problems include adherence to diet, adherence to exercise and psychosocial issues.  Risk factors for coronary artery disease include diabetes mellitus, dyslipidemia, family history, hypertension, male sex, obesity and a sedentary lifestyle.  Hypertension This is a chronic problem. The current episode started more than 1 year ago. The problem is unchanged. The problem is uncontrolled. Associated symptoms include blurred vision. There are no associated agents to hypertension. Risk factors for coronary artery disease include diabetes mellitus, dyslipidemia, family history, male gender, obesity and sedentary lifestyle. Past treatments include diuretics and angiotensin blockers. The current treatment provides mild improvement. Compliance problems include diet, exercise and psychosocial issues.  Hypertensive end-organ damage includes kidney disease. Identifiable causes of hypertension include chronic renal disease.     Review of systems  Constitutional: + minimally fluctuating body weight,  current Body mass index is 28.86 kg/m. , + fatigue, no subjective hyperthermia, no subjective hypothermia Eyes: + blurry vision no xerophthalmia ENT: no sore throat, no nodules palpated in throat, no dysphagia/odynophagia, no hoarseness Cardiovascular: no chest pain, no shortness of breath, no palpitations, + leg swelling Respiratory: no cough, no shortness of breath Gastrointestinal: no nausea/vomiting/diarrhea Musculoskeletal: no muscle/joint aches Skin: no rashes, no hyperemia Neurological: no tremors, no numbness, no tingling, no dizziness Psychiatric: no depression, no anxiety  Objective:     BP 100/60 (BP Location: Left Arm, Patient Position: Sitting,  Cuff Size: Large)   Pulse (!) 56   Ht 5\' 6"  (1.676 m)   Wt 178 lb 12.8 oz (81.1 kg)   BMI 28.86 kg/m   Wt Readings from Last 3 Encounters:  12/23/22 178 lb 12.8 oz (81.1 kg)  12/21/22 173 lb 6.4 oz (78.7 kg)  12/07/22 177 lb 7.5 oz (80.5 kg)     BP Readings from Last 3 Encounters:  12/23/22 100/60  12/21/22 113/72  12/17/22 118/60      Physical Exam- Limited  Constitutional:  Body mass index is 28.86 kg/m. , not in acute distress, normal state of mind Eyes:  EOMI, no exophthalmos Musculoskeletal: no gross deformities, strength intact in all four extremities, no gross restriction of joint movements Skin:  no rashes, no hyperemia Neurological: no tremor with outstretched hands  Diabetic Foot Exam - Simple   No data filed      CMP ( most recent) CMP     Component Value Date/Time   NA 130 (L) 12/08/2022 0508   NA 135 11/05/2022 1028   K 4.3 12/08/2022 0508   CL 102 12/08/2022 0508   CO2 20 (L) 12/08/2022 0508   GLUCOSE 216 (H) 12/08/2022 0508   BUN 5 (L) 12/08/2022 0508   BUN 5 (L) 11/05/2022 1028   CREATININE 0.79 12/08/2022 0508   CALCIUM 8.2 (L) 12/08/2022 0508   PROT 5.7 (L) 12/08/2022 0508   PROT 6.5 11/05/2022 1028   ALBUMIN 2.6 (L) 12/08/2022 0508   ALBUMIN 3.1 (L) 11/05/2022 1028   AST 56 (H) 12/08/2022 0508   ALT 18 12/08/2022 0508   ALKPHOS 117 12/08/2022 0508   BILITOT 3.7 (H) 12/08/2022 0508   BILITOT 3.3 (H) 11/05/2022 1028   GFRNONAA >60 12/08/2022 0508   GFRAA 84 07/10/2020 1005     Diabetic Labs (most recent): Lab Results  Component Value Date   HGBA1C 5.1 08/03/2022   HGBA1C 6.4 (A) 05/12/2022   HGBA1C 10.1 02/04/2022     Lipid Panel ( most recent) Lipid Panel     Component Value Date/Time   CHOL 109 11/24/2021 0817  TRIG 95 11/24/2021 0817   HDL 34 (L) 11/24/2021 0817   CHOLHDL 3.2 11/24/2021 0817   LDLCALC 57 11/24/2021 0817   LABVLDL 18 11/24/2021 0817      Lab Results  Component Value Date   TSH 1.256 08/03/2022            Assessment & Plan:   1) Controlled type 2 diabetes mellitus without complications (HCC)  He presents today with his CGM and meter showing gross hyperglycemia overall.  His most recent A1c was 7.6% on 6/14, increasing from last visit of 6.9%.  His insulin doses were recently reduced after he had a hypoglycemic event during GI follow up sending him to the ED for evaluation.  Analysis of his CGM shows TIR 13%, TAR 87%, TBR 0% with a GMI of 9.9%.  He was diagnosed with cirrhosis between visits and was also taken off his Metformin.  - Shawn Velazquez has currently uncontrolled symptomatic type 2 DM since 57 years of age.   -Recent labs reviewed.  - I had a long discussion with him about the progressive nature of diabetes and the pathology behind its complications. -his diabetes is complicated by chronic alcohol abuse, mild CKD and he remains at a high risk for more acute and chronic complications which include CAD, CVA, CKD, retinopathy, and neuropathy. These are all discussed in detail with him.  The following Lifestyle Medicine recommendations according to American College of Lifestyle Medicine [facility]) were discussed and offered to patient and he agrees to start the journey:  A. Whole Foods, Plant-based plate comprising of fruits and vegetables, plant-based proteins, whole-grain carbohydrates was discussed in detail with the patient.   A list for source of those nutrients were also provided to the patient.  Patient will use only water or unsweetened tea for hydration. B.  The need to stay away from risky substances including alcohol, smoking; obtaining 7 to 9 hours of restorative sleep, at least 150 minutes of moderate intensity exercise weekly, the importance of healthy social connections,  and stress reduction techniques were discussed. C.  A full color page of  Calorie density of various food groups per pound showing examples of each food groups was provided to the patient.  -  Nutritional counseling repeated at each appointment due to patients tendency to fall back in to old habits.  - The patient admits there is a room for improvement in their diet and drink choices. -  Suggestion is made for the patient to avoid simple carbohydrates from their diet including Cakes, Sweet Desserts / Pastries, Ice Cream, Soda (diet and regular), Sweet Tea, Candies, Chips, Cookies, Sweet Pastries, Store Bought Juices, Alcohol in Excess of 1-2 drinks a day, Artificial Sweeteners, Coffee Creamer, and "Sugar-free" Products. This will help patient to have stable blood glucose profile and potentially avoid unintended weight gain.   - I encouraged the patient to switch to unprocessed or minimally processed complex starch and increased protein intake (animal or plant source), fruits, and vegetables.   - Patient is advised to stick to a routine mealtimes to eat 3 meals a day and avoid unnecessary snacks (to snack only to correct hypoglycemia).  - I have approached him with the following individualized plan to manage  his diabetes and patient agrees:   -Avoiding hypoglycemia is a priority in his case due to new diagnosis of cirrhosis.  An acceptable target A1c for him at this point would be 6.5-7%.  -He is advised to continue his Lantus 30 units SQ  nightly and change his Humalog further to 5-11 units TID with meals if glucose is above 90 and he is eating (Specific instructions on how to titrate insulin dosage based on glucose readings given to patient in writing).  He notes he cannot eat large quantities or he will get ill, so I did encourage him to eat small meals  take an additional 2 units for snacks between "meals".  He is advised to stay off Metformin due to diagnosis of cirrhosis.  -he is encouraged to continue monitoring blood glucose 4 times daily (using his CGM), before meals and before bed, and to call the clinic if he has readings less than 70 or greater than 300 for 3 tests in a row.      - he is warned not to take insulin without proper monitoring per orders. - Adjustment parameters are given to him for hypo and hyperglycemia in writing.  - Specific targets for  A1c;  LDL, HDL,  and Triglycerides were discussed with the patient.  2) Blood Pressure /Hypertension:  his blood pressure is controlled to target.   he is advised to continue his current medications including Coreg 3.125 mg po twice daily, Spironolactone 50 mg po  daily, Lasix 40 mg po daily.  3) Lipids/Hyperlipidemia:    Review of his recent lipid panel from 09/06/22 showed controlled LDL at 92.  He is not currently on lipid lowering medications likely due to elevated LFTs and cirrhosis.  4)  Weight/Diet:  his Body mass index is 28.86 kg/m.  -  clearly complicating his diabetes care.   he is a candidate for weight loss. I discussed with him the fact that loss of 5 - 10% of his  current body weight will have the most impact on his diabetes management.  Exercise, and detailed carbohydrates information provided  -  detailed on discharge instructions.  5) Chronic Care/Health Maintenance: -he is on ACEI/ARB and Statin medications and is encouraged to initiate and continue to follow up with Ophthalmology, Dentist, Podiatrist at least yearly or according to recommendations, and advised to stay away from smoking. I have recommended yearly flu vaccine and pneumonia vaccine at least every 5 years; moderate intensity exercise for up to 150 minutes weekly; and sleep for at least 7 hours a day.  - he is advised to maintain close follow up with [name], MD for primary care needs, as well as his other providers for optimal and coordinated care.      I spent  30  minutes in the care of the patient today including review of labs from CMP, Lipids, Thyroid Function, Hematology (current and previous including abstractions from other facilities); face-to-face time discussing  his blood glucose readings/logs, discussing  hypoglycemia and hyperglycemia episodes and symptoms, medications doses, his options of short and long term treatment based on the latest standards of care / guidelines;  discussion about incorporating lifestyle medicine;  and documenting the encounter. Risk reduction counseling performed per USPSTF guidelines to reduce obesity and cardiovascular risk factors.     Please refer to Patient Instructions for Blood Glucose Monitoring and Insulin/Medications Dosing Guide"  in media tab for additional information. Please  also refer to " Patient Self Inventory" in the Media  tab for reviewed elements of pertinent patient history.  Delsa Bern participated in the discussions, expressed understanding, and voiced agreement with the above plans.  All questions were answered to his satisfaction. he is encouraged to contact clinic should he have any questions or  concerns prior to his return visit.   Follow up plan: - Return in about 3 months (around 03/25/2023) for Diabetes F/U with A1c in office, No previsit labs, Bring meter and logs.  Ronny Bacon, [facility] [address] [place], [place] [address] Phone: [phone] Fax: [phone]  12/23/2022, 11:50 AM

## 2022-12-24 ENCOUNTER — Inpatient Hospital Stay: Payer: BC Managed Care – PPO

## 2022-12-24 VITALS — BP 106/77 | HR 53 | Temp 97.6°F | Resp 18

## 2022-12-24 DIAGNOSIS — D696 Thrombocytopenia, unspecified: Secondary | ICD-10-CM

## 2022-12-24 DIAGNOSIS — D500 Iron deficiency anemia secondary to blood loss (chronic): Secondary | ICD-10-CM

## 2022-12-24 DIAGNOSIS — D539 Nutritional anemia, unspecified: Secondary | ICD-10-CM

## 2022-12-24 DIAGNOSIS — Z79899 Other long term (current) drug therapy: Secondary | ICD-10-CM | POA: Diagnosis not present

## 2022-12-24 DIAGNOSIS — D649 Anemia, unspecified: Secondary | ICD-10-CM | POA: Diagnosis not present

## 2022-12-24 DIAGNOSIS — I10 Essential (primary) hypertension: Secondary | ICD-10-CM | POA: Diagnosis not present

## 2022-12-24 LAB — CBC
HCT: 31.1 % — ABNORMAL LOW (ref 39.0–52.0)
Hemoglobin: 11.2 g/dL — ABNORMAL LOW (ref 13.0–17.0)
MCH: 34.8 pg — ABNORMAL HIGH (ref 26.0–34.0)
MCHC: 36 g/dL (ref 30.0–36.0)
MCV: 96.6 fL (ref 80.0–100.0)
Platelets: 104 10*3/uL — ABNORMAL LOW (ref 150–400)
RBC: 3.22 MIL/uL — ABNORMAL LOW (ref 4.22–5.81)
RDW: 17.8 % — ABNORMAL HIGH (ref 11.5–15.5)
WBC: 9.6 10*3/uL (ref 4.0–10.5)
nRBC: 0 % (ref 0.0–0.2)

## 2022-12-24 LAB — SAMPLE TO BLOOD BANK

## 2022-12-24 MED ORDER — SODIUM CHLORIDE 0.9 % IV SOLN: Freq: Once | INTRAVENOUS | Status: AC

## 2022-12-24 MED ORDER — SODIUM CHLORIDE 0.9 % IV SOLN
300.0000 mg | Freq: Once | INTRAVENOUS | Status: AC
  Administered 2022-12-24: 300 mg via INTRAVENOUS
  Filled 2022-12-24: qty 300

## 2022-12-24 MED ORDER — ONDANSETRON HCL 4 MG/2ML IJ SOLN
8.0000 mg | Freq: Once | INTRAMUSCULAR | Status: AC
  Administered 2022-12-24: 8 mg via INTRAVENOUS
  Filled 2022-12-24: qty 4

## 2022-12-24 MED ORDER — CETIRIZINE HCL 10 MG PO TABS
10.0000 mg | ORAL_TABLET | Freq: Once | ORAL | Status: AC
  Administered 2022-12-24: 10 mg via ORAL
  Filled 2022-12-24: qty 1

## 2022-12-24 NOTE — Progress Notes (Signed)
Iron given per orders. Patient tolerated it well without problems. Vitals stable and discharged home from clinic ambulatory. Follow up as scheduled.  

## 2022-12-24 NOTE — Patient Instructions (Signed)
[facility]  Discharge Instructions: Thank you for choosing [facility] to provide your oncology and hematology care.  If you have a lab appointment with the Cancer Center - please note that after April 8th, 2024, all labs will be drawn in the cancer center.  You do not have to check in or register with the main entrance as you have in the past but will complete your check-in in the cancer center.  Wear comfortable clothing and clothing appropriate for easy access to any Portacath or PICC line.   We strive to give you quality time with your provider. You may need to reschedule your appointment if you arrive late (15 or more minutes).  Arriving late affects you and other patients whose appointments are after yours.  Also, if you miss three or more appointments without notifying the office, you may be dismissed from the clinic at the provider's discretion.      For prescription refill requests, have your pharmacy contact our office and allow 72 hours for refills to be completed.    Today you received your iron   To help prevent nausea and vomiting after your treatment, we encourage you to take your nausea medication as directed.  BELOW ARE SYMPTOMS THAT SHOULD BE REPORTED IMMEDIATELY: *FEVER GREATER THAN 100.4 F (38 C) OR HIGHER *CHILLS OR SWEATING *NAUSEA AND VOMITING THAT IS NOT CONTROLLED WITH YOUR NAUSEA MEDICATION *UNUSUAL SHORTNESS OF BREATH *UNUSUAL BRUISING OR BLEEDING *URINARY PROBLEMS (pain or burning when urinating, or frequent urination) *BOWEL PROBLEMS (unusual diarrhea, constipation, pain near the anus) TENDERNESS IN MOUTH AND THROAT WITH OR WITHOUT PRESENCE OF ULCERS (sore throat, sores in mouth, or a toothache) UNUSUAL RASH, SWELLING OR PAIN  UNUSUAL VAGINAL DISCHARGE OR ITCHING   Items with * indicate a potential emergency and should be followed up as soon as possible or go to the Emergency Department if any problems should  occur.  Please show the CHEMOTHERAPY ALERT CARD or IMMUNOTHERAPY ALERT CARD at check-in to the Emergency Department and triage nurse.  Should you have questions after your visit or need to cancel or reschedule your appointment, please contact [facility] [phone]  and follow the prompts.  Office hours are 8:00 a.m. to 4:30 p.m. Monday - Friday. Please note that voicemails left after 4:00 p.m. may not be returned until the following business day.  We are closed weekends and major holidays. You have access to a nurse at all times for urgent questions. Please call the main number to the clinic [phone] and follow the prompts.  For any non-urgent questions, you may also contact your provider using MyChart. We now offer e-Visits for anyone 85 and older to request care online for non-urgent symptoms. For details visit [email or web address].   Also download the MyChart app! Go to the app store, search "MyChart", open the app, select [facility], and log in with your MyChart username and password.

## 2022-12-24 NOTE — Progress Notes (Signed)
Patient started to have nausea and dry heaving during iron infusion at 1118. Iron stopped immediately and vital taken. Patient denies any other symptoms. Patient states "I haven't eaten anything today.  Rojelio Brenner PA made aware of patient's symptoms. Patient to have Zofran 8mg  and eat before infusion restarted.

## 2022-12-28 ENCOUNTER — Ambulatory Visit: Payer: BC Managed Care – PPO | Admitting: Gastroenterology

## 2023-01-02 ENCOUNTER — Telehealth: Payer: Self-pay | Admitting: Gastroenterology

## 2023-01-02 NOTE — Telephone Encounter (Signed)
Reviewed EKG in media from patients PCP office. Qtc elevated at 513. Need to limit use of Zofran given he is on this as well as fluoxetine and can increase his risk of heart arrhythmia. (No more than 8mg /day). A safe alternative for nausea and vomiting would be ginger ale or taking a ginger supplement daily.   Brooke Bonito, MSN, APRN, FNP-BC, AGACNP-BC [facility] at [facility]

## 2023-01-03 ENCOUNTER — Encounter: Payer: Self-pay | Admitting: *Deleted

## 2023-01-03 ENCOUNTER — Ambulatory Visit: Payer: BC Managed Care – PPO | Admitting: Internal Medicine

## 2023-01-03 NOTE — Telephone Encounter (Signed)
Spoke to pt, informed him of recommendations. Pt voiced understanding. Pt states he has been having a lot of caffeine, because he is trying not to use alcohol.

## 2023-01-07 ENCOUNTER — Other Ambulatory Visit (INDEPENDENT_AMBULATORY_CARE_PROVIDER_SITE_OTHER): Payer: Self-pay | Admitting: Gastroenterology

## 2023-01-10 ENCOUNTER — Telehealth: Payer: Self-pay | Admitting: *Deleted

## 2023-01-10 NOTE — Telephone Encounter (Signed)
Received refill request for folic acid 1mg . Pt last OV 12/21/2022

## 2023-01-11 NOTE — Telephone Encounter (Signed)
Noted  

## 2023-01-18 DIAGNOSIS — R799 Abnormal finding of blood chemistry, unspecified: Secondary | ICD-10-CM | POA: Diagnosis not present

## 2023-01-18 DIAGNOSIS — K7682 Hepatic encephalopathy: Secondary | ICD-10-CM | POA: Diagnosis not present

## 2023-01-18 DIAGNOSIS — R791 Abnormal coagulation profile: Secondary | ICD-10-CM | POA: Diagnosis not present

## 2023-01-18 DIAGNOSIS — I8500 Esophageal varices without bleeding: Secondary | ICD-10-CM | POA: Diagnosis not present

## 2023-01-18 DIAGNOSIS — R772 Abnormality of alphafetoprotein: Secondary | ICD-10-CM | POA: Diagnosis not present

## 2023-01-18 DIAGNOSIS — K7031 Alcoholic cirrhosis of liver with ascites: Secondary | ICD-10-CM | POA: Diagnosis not present

## 2023-01-18 DIAGNOSIS — M6284 Sarcopenia: Secondary | ICD-10-CM | POA: Diagnosis not present

## 2023-01-25 NOTE — Progress Notes (Unsigned)
[facility] [address], [place] [address]   CLINIC:  Medical Oncology/Hematology  PCP:  [name], MD [address] [place] [address] [phone]   REASON FOR VISIT:  Follow-up for macrocytic anemia and thrombocytopenia  CURRENT THERAPY: Intermittent IV iron  INTERVAL HISTORY:   Mr. Shawn Velazquez 57 y.o. male returns for routine follow-up of anemia and thrombocytopenia.  He was last seen by Rojelio Brenner PA-C on 11/25/2022.    Since his last visit, he was hospitalized from 12/07/2022 through 12/08/2022 for hypotension.  *** He has not had any other hospitalizations, surgeries, or changes in his baseline health status since his last visit 2 months ago.  At today's visit, he reports feeling fatigued and wiped out.*** *** He felt *** after IV iron.  He is taking ferrous sulfate 325 mg daily. *** He has not had any more "black coffee ground stool" since his last hospital stay. *** He denies any recurrent hematemesis or hematochezia since hospital discharge. *** He continues to follow closely with GI.   He reports easy bruising but denies any petechial rash.    *** No B symptoms, masses, or lymphadenopathy. *** He reports significant fatigue, ice pica, lightheadedness, and dyspnea on exertion.  He has little to no*** energy and 25***% appetite. He endorses that he is maintaining a stable weight.  ASSESSMENT & PLAN:  1.  Thrombocytopenia & macrocytic anemia - Seen at the request of NP Lupita Raider for evaluation and treatment of anemia and thrombocytopenia. - Diagnosed with liver cirrhosis in January 2024 CT abdomen/pelvis (08/02/2022) showed nodular liver consistent with cirrhosis as well as stigmata of portal venous hypertension including moderate volume ascites, esophageal and gastric varices, and recanalization of the umbilical vein; there were some scattered liver lesions noted, but MRI liver appeared benign. Splenomegaly on MRI with spleen  measuring about 14.4 cm. Follows with [facility].  - Hematology workup (08/18/2022): SPEP negative for M spike.  Polyclonal increase in immunoglobulins on immunofixation.  Normal FLC ratio 1.31 (elevated kappa 38.9/elevated lambda 29.7) B12, MMA, folate, copper normal.  No evidence of renal dysfunction based on creatinine 0.82/GFR >60.  (Cystatin C mildly elevated 1.24, correlating to EGFR around 68) Ferritin 375, iron saturation 33% Elevated immature platelet fraction 8.7% LDH 210, reticulocytes 2.3%.  Bilirubin 2.3 (recently diagnosed liver cirrhosis and other elevations in LFTs are noted).  Haptoglobin normal at 49. HIV, hepatitis panel, ANA, and RF negative. - Hospitalized from 08/25/2022 through 08/29/2022 for hematemesis and hematochezia secondary to suspected upper GI bleed.  He had EGD with variceal (grade 2) banding on 08/26/2022, although no overt bleeding was identified; nonbleeding angiodysplastic lesion in duodenum.  He did not require any PRBC transfusions while hospitalized. - Hospitalized from 09/24/2022 through 09/28/2022 due to gastrointestinal hemorrhage with hematemesis, found to have recurrent GI bleed in the setting of esophageal varices, s/p EGD and banding on 09/24/2022.  Lowest Hgb during that admission was 8.9, therefore he did not require blood transfusion during this admission. - Colonoscopy on 04/05/2022 with nonbleeding internal hemorrhoids, polyps x 6, otherwise normal.   - Reports easy bruising.  No B symptoms.*** - He has had some intermittent melanotic stool about once a week since his hospitalization in February 2024*** - Received Venofer 300 mg x 4 from 12/03/2022 through 12/24/2022 - Most recent labs (01/26/2023):  Hgb ***/MCV ***, platelets *** Ferritin ***, iron saturation ***%. - DIFFERENTIAL DIAGNOSIS favors thrombocytopenia and macrocytic anemia secondary to liver cirrhosis/splenomegaly.  He has acute worsening of his anemia secondary to  recent upper GI bleed. - PLAN: Acute worsening of anemia likely due to recent upper GI bleed.  Thrombocytopenia secondary to splenomegaly is stable.*** - *** IV iron *** -Continue ferrous sulfate 325 mg daily.   - Monthly CBC/D+ BB sample*** - Labs in 2*** months (CBC/D, ferritin, iron/TIBC) with OFFICE visit  - If he has persistent anemia despite resolution of iron deficiency, consider rechecking hemolysis labs.   2.  Other history - PMH: Liver cirrhosis, vertigo, sleep apnea, psoriasis, hypertension, GERD, diabetes, chronic back pain, anxiety/depression - TOBACCO: Currently smokes 0.5 PPD cigarettes.  Has been smoking 0.25-0.5 PPD since age 66, but had quit for about 10 years.  Total pack-year history approximately 14. - ALCOHOL: Heavy alcohol consumption for over 30 years.  Has cut back to 1 to 2 cans of beer each week following diagnosis of liver cirrhosis in February 2024. - DRUG/SUBSTANCE ABUSE: Reports previous addiction to hydrocodone pills, which he self reported to his PCP; hydrocodone was discontinued by PCP at the request of the patient. - SOCIAL he works for [employer], but has not been back to work following hospitalization for cirrhosis in January/February 2024.  He is accompanied in clinic by his girlfriend, [name]. - FAMILY: No family history of blood problems.  Maternal grandfather had lung cancer.  Father deceased secondary to mesothelioma.  PLAN SUMMARY:*** Not updated *** >> IV Venofer 300 mg x 4 >> CBC/D+ BB sample in 1 month >> Labs in 2 months = CBC/D, BB sample, ferritin, iron/TIBC >> OFFICE visit in 2 months     REVIEW OF SYSTEMS: ***  Review of Systems  Constitutional:  Positive for appetite change and fatigue. Negative for chills, diaphoresis, fever and unexpected weight change.  HENT:   Negative for lump/mass and nosebleeds.   Eyes:  Negative for eye problems.  Respiratory:  Positive for cough and shortness of breath. Negative for hemoptysis.    Cardiovascular:  Negative for chest pain, leg swelling and palpitations.  Gastrointestinal:  Positive for blood in stool, constipation, diarrhea, nausea and vomiting. Negative for abdominal pain.  Genitourinary:  Negative for hematuria.        "Swelling in groin"  Skin: Negative.   Neurological:  Positive for dizziness, headaches and numbness. Negative for light-headedness.  Hematological:  Bruises/bleeds easily (bruising).  Psychiatric/Behavioral:  Positive for depression and sleep disturbance. The patient is nervous/anxious.      PHYSICAL EXAM:***  ECOG PERFORMANCE STATUS: 1 - Symptomatic but completely ambulatory  There were no vitals filed for this visit. There were no vitals filed for this visit. Physical Exam Constitutional:      Appearance: Normal appearance. He is obese.  Cardiovascular:     Heart sounds: Normal heart sounds.  Pulmonary:     Breath sounds: Wheezing present.  Neurological:     General: No focal deficit present.     Mental Status: Mental status is at baseline.  Psychiatric:        Behavior: Behavior normal. Behavior is cooperative.     PAST MEDICAL/SURGICAL HISTORY:  Past Medical History:  Diagnosis Date   Anxiety    Arthritis    affects hands, shoulder, neck, knees, hips, ankles, toes   Chronic back pain    Diabetes mellitus    diet controlled   Eczema    Fatty liver, alcoholic    GERD (gastroesophageal reflux disease)    Glaucoma    slight case   Hypertension    Iron  deficiency anemia due to chronic blood loss 11/25/2022   Psoriasis    Sleep apnea    Substance abuse (HCC)    Vertigo 09/11/2017   Wears glasses    Past Surgical History:  Procedure Laterality Date   COLONOSCOPY WITH PROPOFOL N/A 04/05/2022   Procedure: COLONOSCOPY WITH PROPOFOL;  Surgeon: Lanelle Bal, DO;  [facility];  Service: Endoscopy;  Laterality: N/A;  10:45 AM,unable to reach pt to move up   ESOPHAGEAL BANDING N/A 08/26/2022   Procedure:  ESOPHAGEAL BANDING;  Surgeon: Lanelle Bal, DO;  [facility];  Service: Endoscopy;  Laterality: N/A;   ESOPHAGEAL BANDING  09/24/2022   Procedure: ESOPHAGEAL BANDING;  Surgeon: Corbin Ade, MD;  [facility]: [facility];  Service: Endoscopy;;   ESOPHAGOGASTRODUODENOSCOPY (EGD) WITH PROPOFOL N/A 08/26/2022   Procedure: ESOPHAGOGASTRODUODENOSCOPY (EGD) WITH PROPOFOL;  Surgeon: Lanelle Bal, DO;  [facility];  Service: Endoscopy;  Laterality: N/A;   ESOPHAGOGASTRODUODENOSCOPY (EGD) WITH PROPOFOL N/A 09/24/2022   Procedure: ESOPHAGOGASTRODUODENOSCOPY (EGD) WITH PROPOFOL;  Surgeon: Corbin Ade, MD;  [facility];  Service: Endoscopy;  Laterality: N/A;  hematemesis, esophageal varices, melena   MULTIPLE TOOTH EXTRACTIONS     POLYPECTOMY  04/05/2022   Procedure: POLYPECTOMY;  Surgeon: Lanelle Bal, DO;  [facility]: [facility];  Service: Endoscopy;;   POSTERIOR CERVICAL LAMINECTOMY  04/21/2012   Procedure: POSTERIOR CERVICAL LAMINECTOMY;  Surgeon: Mariam Dollar, MD;  [facility]: [facility];  Service: Neurosurgery;  Laterality: Left;  Posterior Cervical laminectomy/foraminotomy and diskectomy, left cervical seven-thoracic one   ROTATOR CUFF REPAIR     THORACIC DISCECTOMY Left 04/03/2014   Procedure: Left Thoracic Seven to Eight, Thoracic Eight to Nine Thoracic Discectomy ;  Surgeon: Mariam Dollar, MD;  Location: [facility];  Service: Neurosurgery;  Laterality: Left;    SOCIAL HISTORY:  Social History   Socioeconomic History   Marital status: Divorced    Spouse name: Not on file   Number of children: Not on file   Years of education: Not on file   Highest education level: Not on file  Occupational History    Comment: Truck to storage  Tobacco Use   Smoking status: Every Day    Current packs/day: 0.00    Average packs/day: 0.3 packs/day for 40.0 years (10.0 ttl pk-yrs)    Types: Cigarettes    Start date: 05/09/1982    Last attempt to quit:  05/09/2022    Years since quitting: 0.7   Smokeless tobacco: Never   Tobacco comments:    pt trying to quit on his own  Vaping Use   Vaping status: Former  Substance and Sexual Activity   Alcohol use: Not Currently    Alcohol/week: 12.0 standard drinks of alcohol    Types: 12 Cans of beer per week    Comment: last intake of any alcohol in May 2024 /Either Smirnoff Smash-4 per day or Fireball-10 airplane bottles in a day   Drug use: No   Sexual activity: Yes    Birth control/protection: None  Other Topics Concern   Not on file  Social History Narrative   Not on file   Social Determinants of Health   Financial Resource Strain: Medium Risk (10/07/2022)   Overall Financial Resource Strain (CARDIA)    Difficulty of Paying Living Expenses: Somewhat hard  Food Insecurity: Low Risk  (01/18/2023)   Received from [facility]   Food vital sign    Within  the past 12 months, you worried that your food would run out before you got money to buy more: Never true    Within the past 12 months, the food you bought just didn't last and you didn't have money to get more. : Never true  Transportation Needs: Not on file (01/18/2023)  Physical Activity: Not on file  Stress: Stress Concern Present (10/07/2022)   [employer] of Occupational Health - Occupational Stress Questionnaire    Feeling of Stress : To some extent  Social Connections: Not on file  Intimate Partner Violence: Not At Risk (12/08/2022)   Humiliation, Afraid, Rape, and Kick questionnaire    Fear of Current or Ex-Partner: No    Emotionally Abused: No    Physically Abused: No    Sexually Abused: No    FAMILY HISTORY:  Family History  Problem Relation Age of Onset   Diabetes Father    Cancer - Lung Father    Hypertension Other    Cancer - Lung Other     CURRENT MEDICATIONS:  Outpatient Encounter Medications as of 01/26/2023  Medication Sig   albuterol (VENTOLIN HFA) 108 (90 Base) MCG/ACT inhaler Inhale 2 puffs into the  lungs every 6 (six) hours as needed for wheezing or shortness of breath.   busPIRone (BUSPAR) 5 MG tablet Take 1 tablet (5 mg total) by mouth 2 (two) times daily.   carvedilol (COREG) 3.125 MG tablet Take 1 tablet (3.125 mg total) by mouth 2 (two) times daily with a meal.   Continuous Glucose Sensor (DEXCOM G7 SENSOR) MISC Inject 1 application  into the skin as directed. Change sensor every 10 days as directed.   ferrous sulfate 324 MG TBEC Take 1 tablet (324 mg total) by mouth daily with breakfast.   FLUoxetine (PROZAC) 10 MG capsule TAKE 4 CAPSULES BY MOUTH EVERY DAY (Patient taking differently: Take 10 mg by mouth See admin instructions. TAKE 2 CAPSULES IN THE MORNING AND 2 CAPSULES EVERY EVENING.)   folic acid (FOLVITE) 1 MG tablet TAKE 1 TABLET BY MOUTH DAILY   furosemide (LASIX) 40 MG tablet Take 1 tablet (40 mg total) by mouth daily.   gabapentin (NEURONTIN) 100 MG capsule Take 1 tablet by mouth daily.   glucose blood test strip Use as instructed to monitor glucose 4 times daily   insulin glargine (LANTUS SOLOSTAR) 100 UNIT/ML Solostar Pen Inject 30 Units into the skin at bedtime.   insulin lispro (HUMALOG KWIKPEN) 100 UNIT/ML KwikPen Inject 5-11 Units into the skin 3 (three) times daily.   Insulin Pen Needle (PEN NEEDLES) 32G X 4 MM MISC 1 each by Does not apply route in the morning, at noon, in the evening, and at bedtime. Use to inject insulin 4 times daily   lactulose (CHRONULAC) 10 GM/15ML solution Take 15 mLs (10 g total) by mouth daily.   levocetirizine (XYZAL) 5 MG tablet Take 1 tablet by mouth daily.   midodrine (PROAMATINE) 5 MG tablet Take 1 tablet (5 mg total) by mouth 3 (three) times daily with meals. To help Prevent Low Blood Pressure   Multiple Vitamin (MULTIVITAMIN) tablet Take 1 tablet by mouth daily.   ondansetron (ZOFRAN-ODT) 4 MG disintegrating tablet Take 1 tablet (4 mg total) by mouth every 8 (eight) hours as needed for nausea or vomiting.   pantoprazole (PROTONIX) 40  MG tablet Take 1 tablet (40 mg total) by mouth 2 (two) times daily.   rifaximin (XIFAXAN) 550 MG TABS tablet Take 1 tablet (550 mg total) by mouth  2 (two) times daily.   spironolactone (ALDACTONE) 50 MG tablet Take 1 tablet (50 mg total) by mouth daily.   thiamine (VITAMIN B-1) 100 MG tablet Take 1 tablet (100 mg total) by mouth daily.   No facility-administered encounter medications on file as of 01/26/2023.    ALLERGIES:  Allergies  Allergen Reactions   Neosporin + Pain Relief Max St [Neomy-Bacit-Polymyx-Pramoxine] Rash    LABORATORY DATA:  I have reviewed the labs as listed.  CBC    Component Value Date/Time   WBC 9.6 12/24/2022 0823   RBC 3.22 (L) 12/24/2022 0823   HGB 11.2 (L) 12/24/2022 0823   HGB 14.4 07/31/2021 1204   HCT 31.1 (L) 12/24/2022 0823   HCT 42.4 07/31/2021 1204   PLT 104 (L) 12/24/2022 0823   PLT 146 (L) 07/31/2021 1204   MCV 96.6 12/24/2022 0823   MCV 97 07/31/2021 1204   MCH 34.8 (H) 12/24/2022 0823   MCHC 36.0 12/24/2022 0823   RDW 17.8 (H) 12/24/2022 0823   RDW 11.8 07/31/2021 1204   LYMPHSABS 1.8 11/11/2022 1023   LYMPHSABS 1.6 07/31/2021 1204   MONOABS 0.8 11/11/2022 1023   EOSABS 0.3 11/11/2022 1023   EOSABS 0.2 07/31/2021 1204   BASOSABS 0.1 11/11/2022 1023   BASOSABS 0.1 07/31/2021 1204      Latest Ref Rng & Units 12/08/2022    5:08 AM 12/07/2022   10:41 AM 12/07/2022    9:20 AM  CMP  Glucose 70 - 99 mg/dL 536   644   BUN 6 - 20 mg/dL 5   5   Creatinine 0.34 - 1.24 mg/dL 7.42   5.95   Sodium 638 - 145 mmol/L 130   126   Potassium 3.5 - 5.1 mmol/L 4.3   3.9   Chloride 98 - 111 mmol/L 102   94   CO2 22 - 32 mmol/L 20   24   Calcium 8.9 - 10.3 mg/dL 8.2   8.4   Total Protein 6.5 - 8.1 g/dL 5.7  5.8    Total Bilirubin 0.3 - 1.2 mg/dL 3.7  3.6    Alkaline Phos 38 - 126 U/L 117  131    AST 15 - 41 U/L 56  53    ALT 0 - 44 U/L 18  20      DIAGNOSTIC IMAGING:  I have independently reviewed the relevant imaging and discussed with the  patient.   WRAP UP:  All questions were answered. The patient knows to call the clinic with any problems, questions or concerns.  Medical decision making: Moderate  Time spent on visit: I spent 20 minutes counseling the patient face to face. The total time spent in the appointment was 30 minutes and more than 50% was on counseling.  Carnella Guadalajara, PA-C  ***

## 2023-01-26 ENCOUNTER — Inpatient Hospital Stay: Payer: BC Managed Care – PPO

## 2023-01-26 ENCOUNTER — Inpatient Hospital Stay: Payer: BC Managed Care – PPO | Attending: Hematology | Admitting: Physician Assistant

## 2023-01-26 VITALS — BP 124/88 | HR 71 | Temp 98.9°F | Resp 18 | Ht 66.0 in | Wt 165.6 lb

## 2023-01-26 DIAGNOSIS — Z79899 Other long term (current) drug therapy: Secondary | ICD-10-CM | POA: Insufficient documentation

## 2023-01-26 DIAGNOSIS — K7460 Unspecified cirrhosis of liver: Secondary | ICD-10-CM | POA: Insufficient documentation

## 2023-01-26 DIAGNOSIS — F17210 Nicotine dependence, cigarettes, uncomplicated: Secondary | ICD-10-CM | POA: Diagnosis not present

## 2023-01-26 DIAGNOSIS — R161 Splenomegaly, not elsewhere classified: Secondary | ICD-10-CM | POA: Diagnosis not present

## 2023-01-26 DIAGNOSIS — D696 Thrombocytopenia, unspecified: Secondary | ICD-10-CM | POA: Diagnosis not present

## 2023-01-26 DIAGNOSIS — R634 Abnormal weight loss: Secondary | ICD-10-CM | POA: Diagnosis not present

## 2023-01-26 DIAGNOSIS — D649 Anemia, unspecified: Secondary | ICD-10-CM | POA: Diagnosis not present

## 2023-01-26 DIAGNOSIS — D500 Iron deficiency anemia secondary to blood loss (chronic): Secondary | ICD-10-CM | POA: Diagnosis not present

## 2023-01-26 DIAGNOSIS — D539 Nutritional anemia, unspecified: Secondary | ICD-10-CM

## 2023-01-26 LAB — CBC WITH DIFFERENTIAL/PLATELET
Abs Immature Granulocytes: 0.02 10*3/uL (ref 0.00–0.07)
Basophils Absolute: 0.1 10*3/uL (ref 0.0–0.1)
Basophils Relative: 1 %
Eosinophils Absolute: 0.4 10*3/uL (ref 0.0–0.5)
Eosinophils Relative: 5 %
HCT: 35.5 % — ABNORMAL LOW (ref 39.0–52.0)
Hemoglobin: 13.1 g/dL (ref 13.0–17.0)
Immature Granulocytes: 0 %
Lymphocytes Relative: 27 %
Lymphs Abs: 2.4 10*3/uL (ref 0.7–4.0)
MCH: 37.5 pg — ABNORMAL HIGH (ref 26.0–34.0)
MCHC: 36.9 g/dL — ABNORMAL HIGH (ref 30.0–36.0)
MCV: 101.7 fL — ABNORMAL HIGH (ref 80.0–100.0)
Monocytes Absolute: 1 10*3/uL (ref 0.1–1.0)
Monocytes Relative: 11 %
Neutro Abs: 5.1 10*3/uL (ref 1.7–7.7)
Neutrophils Relative %: 56 %
Platelets: 101 10*3/uL — ABNORMAL LOW (ref 150–400)
RBC: 3.49 MIL/uL — ABNORMAL LOW (ref 4.22–5.81)
RDW: 14.6 % (ref 11.5–15.5)
WBC: 9 10*3/uL (ref 4.0–10.5)
nRBC: 0 % (ref 0.0–0.2)

## 2023-01-26 LAB — SAMPLE TO BLOOD BANK

## 2023-01-26 LAB — FERRITIN: Ferritin: 582 ng/mL — ABNORMAL HIGH (ref 24–336)

## 2023-01-26 LAB — IRON AND TIBC
Iron: 84 ug/dL (ref 45–182)
Saturation Ratios: 39 % (ref 17.9–39.5)
TIBC: 216 ug/dL — ABNORMAL LOW (ref 250–450)
UIBC: 132 ug/dL

## 2023-01-26 NOTE — Patient Instructions (Signed)
[facility] at [facility] **VISIT SUMMARY & IMPORTANT INSTRUCTIONS **   You were seen today by Rojelio Brenner PA-C for your anemia (low red blood cells) and thrombocytopenia (low platelets).    IRON DEFICIENCY ANEMIA Your blood and iron levels look much better! You continue to be at risk for recurrent GI blood loss (bleeding from your esophagus and stomach related to your liver cirrhosis) You do not need any IV iron at this time. You should continue taking daily iron supplement (ferrous sulfate 325 mg). Seek immediate medical attention if you notice any recurrent bleeding such as bright red blood in the toilet, vomiting blood, or black tarry bowel movements.   LOW PLATELETS ("THROMBOCYTOPENIA") Your low platelets are related to your liver cirrhosis and enlarged spleen. You do not need any treatment of your low platelets, but we will continue to monitor this at follow-up visits.  We will check your blood count in 1 month and will check full lab panel lab panel and see you for office visit in 3 months.   ** Thank you for trusting me with your healthcare!  I strive to provide all of my patients with quality care at each visit.  If you receive a survey for this visit, I would be so grateful to you for taking the time to provide feedback.  Thank you in advance!  ~ Taye Cato                   Dr. Doreatha Massed   &   Rojelio Brenner, PA-C   - - - - - - - - - - - - - - - - - -    Thank you for choosing  [facility] Center at [facility] to provide your oncology and hematology care.  To afford each patient quality time with our provider, please arrive at least 15 minutes before your scheduled appointment time.   If you have a lab appointment with the Cancer Center please come in thru the Main Entrance and check in at the main information desk.  You need to re-schedule your appointment should you arrive 10 or more minutes late.  We strive to give  you quality time with our providers, and arriving late affects you and other patients whose appointments are after yours.  Also, if you no show three or more times for appointments you may be dismissed from the clinic at the providers discretion.     Again, thank you for choosing [facility].  Our hope is that these requests will decrease the amount of time that you wait before being seen by our physicians.       _____________________________________________________________  Should you have questions after your visit to [facility], please contact our office at [phone] and follow the prompts.  Our office hours are 8:00 a.m. and 4:30 p.m. Monday - Friday.  Please note that voicemails left after 4:00 p.m. may not be returned until the following business day.  We are closed weekends and major holidays.  You do have access to a nurse 24-7, just call the main number to the clinic [phone] and do not press any options, hold on the line and a nurse will answer the phone.    For prescription refill requests, have your pharmacy contact our office and allow 72 hours.

## 2023-02-08 ENCOUNTER — Telehealth: Payer: Self-pay | Admitting: *Deleted

## 2023-02-08 ENCOUNTER — Other Ambulatory Visit: Payer: Self-pay | Admitting: Gastroenterology

## 2023-02-08 MED ORDER — LACTULOSE 10 GM/15ML PO SOLN: 10.0000 g | Freq: Every day | ORAL | 3 refills | Status: DC

## 2023-02-08 NOTE — Telephone Encounter (Signed)
Pt sister called (DPR) states he needs lactulose sent to pharmacy. He is out.

## 2023-02-09 ENCOUNTER — Telehealth: Payer: Self-pay | Admitting: Gastroenterology

## 2023-02-09 ENCOUNTER — Ambulatory Visit: Payer: BC Managed Care – PPO | Admitting: Gastroenterology

## 2023-02-09 NOTE — Telephone Encounter (Signed)
Patient's sister came by and asked if you could call in Lactulose for the patient.  [facility] on [address].

## 2023-02-10 NOTE — Telephone Encounter (Signed)
Pt notified medications was sent.

## 2023-02-14 ENCOUNTER — Other Ambulatory Visit: Payer: Self-pay | Admitting: Gastroenterology

## 2023-02-14 DIAGNOSIS — K7031 Alcoholic cirrhosis of liver with ascites: Secondary | ICD-10-CM

## 2023-02-21 ENCOUNTER — Encounter: Payer: Self-pay | Admitting: Gastroenterology

## 2023-02-21 ENCOUNTER — Ambulatory Visit: Payer: BC Managed Care – PPO | Admitting: Gastroenterology

## 2023-02-21 ENCOUNTER — Telehealth: Payer: Self-pay | Admitting: *Deleted

## 2023-02-21 VITALS — BP 108/73 | HR 73 | Temp 98.6°F | Ht 66.0 in | Wt 173.4 lb

## 2023-02-21 DIAGNOSIS — D649 Anemia, unspecified: Secondary | ICD-10-CM | POA: Diagnosis not present

## 2023-02-21 DIAGNOSIS — K7031 Alcoholic cirrhosis of liver with ascites: Secondary | ICD-10-CM

## 2023-02-21 DIAGNOSIS — R112 Nausea with vomiting, unspecified: Secondary | ICD-10-CM

## 2023-02-21 DIAGNOSIS — K219 Gastro-esophageal reflux disease without esophagitis: Secondary | ICD-10-CM | POA: Diagnosis not present

## 2023-02-21 MED ORDER — LACTULOSE 10 GM/15ML PO SOLN: 10.0000 g | Freq: Two times a day (BID) | ORAL | 4 refills | Status: DC

## 2023-02-21 NOTE — Progress Notes (Unsigned)
GI Office Note    Referring Provider: [name], MD Primary Care Physician:  [name], MD Primary Gastroenterologist: Hennie Duos. Marletta Lor, DO  Date:  02/21/2023  ID:  Shawn Velazquez, DOB 1965-12-08, MRN [id number]   Chief Complaint   Chief Complaint  Patient presents with   Follow-up    Follow up on GERD   History of Present Illness  Shawn Velazquez is a 57 y.o. male with a history of alcoholic liver cirrhosis, alcohol abuse, sleep apnea, diabetes, HTN presenting today with complaint of vomiting and fear of variceal bands coming off presenting today for follow up.    MRI liver with and without contrast 08/03/2022: -Chronic liver disease -Macronodular liver with multiple presumed regenerative nodules -No clear aggressive appearing lesions with restricted diffusion -Mild ascites -Varices on the stomach and lower esophagus -Mild splenic enlargement   Hospitalized at [facility] 08/26/2022 through 08/29/2022: Presented with hematemesis and melena for 3 days.  Noted to have previous admission at the end of January for anasarca.  He underwent EGD for hematemesis and melena during this hospitalization in February.  Procedure outlined below.  He was treated with octreotide and PPI infusion as well as IV Rocephin for SBP prophylaxis.  He was also treated with Carafate 1 g 4 times daily.  His MELD was 27 on admission with a DF of 45.9.  Was treated with prednisolone during his prior hospitalization in January.  Patient has reported intermittent drowsiness and confusion concerning for possible hepatic encephalopathy but has been maintained on lactulose.  Given significant bowel movements while inpatient his lactulose was decreased from 3 times daily to twice daily.  Given he was still having alcohol use prednisolone was not started.  Prior to discharge he was advised to continue Lasix 20 mg daily 100 mg spironolactone daily.  His MELD 3.0 was 22 on discharge.  Per Dr. Levon Hedger, no need for Xifaxan  at this time.  Alcohol cessation encouraged. Recommendations on discharge were to continue Carafate for 2 weeks, PPI twice daily, Lasix 40 mg daily, spironolactone 100 mg daily, lactulose 2-3 bowel movements daily, carvedilol 3.125 mg twice daily.  Need to arrange outpatient evaluation at transplant center given elevated levels.   EGD 08/26/2022: -grade 2 esophageal varices s/p banding x 3 with complete eradication, without bleeding -Single nonbleeding angiodysplastic lesion in the duodenum.  -Advised Carafate suspension 1 g before meals and nightly -Repeat EGD in 4-8 weeks.   Recent hospitalization 09/24/22-09/28/22.  He presented to the ED with hematemesis, having multiple episodes.  Reportedly continued to drink alcohol.  Reports some mild tremors and intermittent confusion but no worse than previous.  Complaining of lower extremity edema and abdominal distention.  History of Rocephin for SBP prophylaxis.  Underwent EGD with Dr. Jena Gauss.  IV twice daily and octreotide infusion.  Continued on lactulose as well as daily diuretic regimen of 40 mg of Lasix and 100 mg of spironolactone.  EGD as outlined below.  Was given vitamin K x 1 dose. Discharged on lasix 40 mg daily and spironolactone 150mg  daily (due to hypokalemia).  Recommended restarting Coreg 3.125 mg twice daily.   EGD 09/24/22: -4 columns of grade 2-3 esophageal varices, cherry red spots 1 on each of 2 columns without active bleeding -Portal hypertensive gastropathy with touch friability, no gastric varices -Microvasive 7 shot Bander patch -1 band placed on each column with good hemostasis -Advise repeat banding in 4 weeks. -Continue PPI   Office visit 10/14/22. Advised to continue current  medications including Lasix 40 mg daily, spironolactone 150 mg daily, carvedilol 3.125 twice daily, lactulose, and PPI twice daily.  Start on Xifaxan 550 mg twice daily.  Advised he may use Gas-X as needed for bloating.  Due for liver imaging in July 2024.   Advised to proceed with EGD that was already scheduled. EGD cancelled given patient lost insurance 10/14/22.    Office visit 11/04/22. Increased N/V. Swelling fairly well controlled. Abdomen tight at times. Eating very little. Poor appetite. Was unsure if taking Xifaxin. 1-2 BM daily. Lactulose twice daily. Insurance reinstated. Advised labs and reschedule EGD. Start Xifaxin. Continue lasix, spironolactone (150) carvedilol 3.125 mg BID. Low sodium diet. Start ensures and follow up with [facility] regarding possible transplant candidacy.    Contacted by heme/onc PA 5/31 regarding patient with dizziness and shortness of breath who presented for iron infusion. He was given 500 cc bolus of NS and CBC checked. Hgb 10.6, plts 174. Last ferritin was 19 (11/11/22).  I was asked for recommendations for treatment and recommended decreasing spironolactone from 150mg  to 100 mg daily given BP was a little soft and asked to follow up in the office. His BP improved to 123/72 after fluids.Tolerated infusion without issue.    Office visit 12/07/22. BP has been on the lower side.  Stated it was fine all weekend.  Continues to have issues with insomnia.  Not yet taking his morning medications and not only had a little bit of water.  Not feeling dizzy.  Does feel sleepy.  Swelling has been controlled and denies any abdominal pain.  Taking Xifaxan as directed.  Nausea has been fairly well-controlled with Zofran.  Referral for the last couple weeks his BP has been on the lower side steadily decreasing but overall he has been feeling better than usual and no worsening confusion.  Appetite also improved.  Multiple rechecks of his blood pressure revealed ongoing hypotension therefore he was advised to go to the ED for fluids and further evaluation.  Tried to discuss with hematology however they stated they were unable to accommodate him for fluid administration without going through the ED.  Given his blood pressure he was advised to stop  carvedilol.   [facility] 12/08/22.  He is orthostatic BPs were positive and he became mildly symptomatic.  He received IV hydration with fluid boluses.  Labs with AST 53, ALT 20, alk phos 131, T. bili 3.6 with indirect 2.7 and direct 0.9.  Albumin 2.3.  Sodium noted to be low at 126, potassium 3.9, and creatinine 0.83.  INR elevated at 1.9.  He was also noted to be bradycardic.  Initially held his Aldactone, Lasix, and Coreg.  He received IV fluids and albumin and was given midodrine.  He was advised to restart his Coreg on 6/6 and reduce his spironolactone to 50 mg daily starting on 6/6.  Advised to restart Lasix on Saturday 6/8 and continue to monitor BP at home.  Reportedly scheduled for iron infusion with hematology on 6/7.  Outpatient labs on 7/16 with elevated AFP at 10.1, INR 1.5, creatinine 0.92, potassium 3.3, albumin 3.5, alk phos 211, AST 73, ALT 29.  Today:  Finished up iron infusions in June 21. Energy has improved some since iron infusion. Still taking oral iron. No melena or brbpr.   Has been having daily morning nausea. Does not vomit often but did this morning and this past Friday (lasted almost all day long). Emesis was phlegm mostly. Sometimes in the  morning he gets up and eats something - usually will drink chicken broth to help ease his stomach. Has recently cut back on his caffeine for about 1-2 weeks. He will take the Zofran when he gets nauseated but sometimes he states it is too late . It does help if he takes it in time. Admits to drinking Gatorade to stay hydrated. Water sometimes wont stay down and he will vomit it up. Drinking premier protein once daily. Has been eating more softer foods, staying away from a lot of solid foods. Primarily eating chicken.   Sister states he is still sleeping a lot lately.   Has been having at least 1 sometimes 2 BM daily (depends on the day with the consistency). Has been doing lactulose 1 dose in the morning.    Swelling comes and goes. Some days he will have no swelling in his legs and then will develop some puffiness.   States he needs to do inpatient rehab for alcohol and quit smoking to be considered for transplant. Need to find out if insurance will pay.   Admits to still drinking alcohol. He reports drinking 12 oz beers 2-3 a few times per week.  His sister who accompanies him today reports that she feels as though he is drinking much more than he is letting on, at least daily given she can tell in his voice.  He has been doing this for at least the last 3 weeks given he has not been living with her, and he is living at home with his girlfriend.  MELD 3.0: 23 at 12/08/2022  5:08 AM MELD-Na: 24 at 12/08/2022  5:08 AM Calculated from: Serum Creatinine: 0.79 mg/dL (Using min of 1 mg/dL) at 07/10/1094  0:45 AM Serum Sodium: 130 mmol/L at 12/08/2022  5:08 AM Total Bilirubin: 3.7 mg/dL at 4/0/9811  9:14 AM Serum Albumin: 2.6 g/dL at 01/10/2955  2:13 AM INR(ratio): 1.9 at 12/08/2022  5:08 AM Age at listing (hypothetical): 56 years Sex: Male at 12/08/2022  5:08 AM    Wt Readings from Last 3 Encounters:  02/21/23 173 lb 6.4 oz (78.7 kg)  01/26/23 165 lb 9.6 oz (75.1 kg)  12/23/22 178 lb 12.8 oz (81.1 kg)    Current Outpatient Medications  Medication Sig Dispense Refill   albuterol (VENTOLIN HFA) 108 (90 Base) MCG/ACT inhaler Inhale 2 puffs into the lungs every 6 (six) hours as needed for wheezing or shortness of breath. 18 g 0   busPIRone (BUSPAR) 5 MG tablet Take 1 tablet (5 mg total) by mouth 2 (two) times daily. 60 tablet 2   carvedilol (COREG) 3.125 MG tablet Take 1 tablet (3.125 mg total) by mouth 2 (two) times daily with a meal. 60 tablet 2   Continuous Glucose Sensor (DEXCOM G7 SENSOR) MISC Inject 1 application  into the skin as directed. Change sensor every 10 days as directed. 9 each 3   ferrous sulfate 324 MG TBEC Take 1 tablet (324 mg total) by mouth daily with breakfast. 30 tablet 3    FLUoxetine (PROZAC) 10 MG capsule TAKE 4 CAPSULES BY MOUTH EVERY DAY (Patient taking differently: Take 10 mg by mouth See admin instructions. TAKE 2 CAPSULES IN THE MORNING AND 2 CAPSULES EVERY EVENING.) 260 capsule 0   folic acid (FOLVITE) 1 MG tablet TAKE 1 TABLET BY MOUTH DAILY 30 tablet 5   furosemide (LASIX) 40 MG tablet Take 1 tablet (40 mg total) by mouth daily. 30 tablet 2   gabapentin (NEURONTIN) 100 MG capsule  Take 1 tablet by mouth daily.     glucose blood test strip Use as instructed to monitor glucose 4 times daily 100 each 12   insulin glargine (LANTUS SOLOSTAR) 100 UNIT/ML Solostar Pen Inject 30 Units into the skin at bedtime. 30 mL 3   insulin lispro (HUMALOG KWIKPEN) 100 UNIT/ML KwikPen Inject 5-11 Units into the skin 3 (three) times daily. 28 mL 3   Insulin Pen Needle (PEN NEEDLES) 32G X 4 MM MISC 1 each by Does not apply route in the morning, at noon, in the evening, and at bedtime. Use to inject insulin 4 times daily 200 each 3   lactulose (CHRONULAC) 10 GM/15ML solution Take 15 mLs (10 g total) by mouth daily. 946 mL 3   levocetirizine (XYZAL) 5 MG tablet Take 1 tablet by mouth daily.     midodrine (PROAMATINE) 5 MG tablet Take 1 tablet (5 mg total) by mouth 3 (three) times daily with meals. To help Prevent Low Blood Pressure 90 tablet 3   Multiple Vitamin (MULTIVITAMIN) tablet Take 1 tablet by mouth daily.     ondansetron (ZOFRAN-ODT) 4 MG disintegrating tablet Take 1 tablet (4 mg total) by mouth every 8 (eight) hours as needed for nausea or vomiting. 30 tablet 1   pantoprazole (PROTONIX) 40 MG tablet Take 1 tablet (40 mg total) by mouth 2 (two) times daily. 60 tablet 5   spironolactone (ALDACTONE) 50 MG tablet Take 1 tablet (50 mg total) by mouth daily. 90 tablet 3   thiamine (VITAMIN B-1) 100 MG tablet Take 1 tablet (100 mg total) by mouth daily. 30 tablet 3   XIFAXAN 550 MG TABS tablet TAKE 1 TABLET BY MOUTH TWICE DAILY 60 tablet 11   No current facility-administered  medications for this visit.    Past Medical History:  Diagnosis Date   Anxiety    Arthritis    affects hands, shoulder, neck, knees, hips, ankles, toes   Chronic back pain    Diabetes mellitus    diet controlled   Eczema    Fatty liver, alcoholic    GERD (gastroesophageal reflux disease)    Glaucoma    slight case   Hypertension    Iron deficiency anemia due to chronic blood loss 11/25/2022   Psoriasis    Sleep apnea    Substance abuse (HCC)    Vertigo 09/11/2017   Wears glasses     Past Surgical History:  Procedure Laterality Date   COLONOSCOPY WITH PROPOFOL N/A 04/05/2022   Procedure: COLONOSCOPY WITH PROPOFOL;  Surgeon: Lanelle Bal, DO;  [facility]: [facility];  Service: Endoscopy;  Laterality: N/A;  10:45 AM,unable to reach pt to move up   ESOPHAGEAL BANDING N/A 08/26/2022   Procedure: ESOPHAGEAL BANDING;  Surgeon: Lanelle Bal, DO;  Location: [facility];  Service: Endoscopy;  Laterality: N/A;   ESOPHAGEAL BANDING  09/24/2022   Procedure: ESOPHAGEAL BANDING;  Surgeon: Corbin Ade, MD;  Location: [facility];  Service: Endoscopy;;   ESOPHAGOGASTRODUODENOSCOPY (EGD) WITH PROPOFOL N/A 08/26/2022   Procedure: ESOPHAGOGASTRODUODENOSCOPY (EGD) WITH PROPOFOL;  Surgeon: Lanelle Bal, DO;  [facility]: [facility];  Service: Endoscopy;  Laterality: N/A;   ESOPHAGOGASTRODUODENOSCOPY (EGD) WITH PROPOFOL N/A 09/24/2022   Procedure: ESOPHAGOGASTRODUODENOSCOPY (EGD) WITH PROPOFOL;  Surgeon: Corbin Ade, MD;  Location: [facility];  Service: Endoscopy;  Laterality: N/A;  hematemesis, esophageal varices, melena   MULTIPLE TOOTH EXTRACTIONS     POLYPECTOMY  04/05/2022   Procedure: POLYPECTOMY;  Surgeon: Marletta Lor,  Hennie Duos, DO;  Location: [facility];  Service: Endoscopy;;   POSTERIOR CERVICAL LAMINECTOMY  04/21/2012   Procedure: POSTERIOR CERVICAL LAMINECTOMY;  Surgeon: Mariam Dollar, MD;  Location: [facility];  Service: Neurosurgery;  Laterality: Left;   Posterior Cervical laminectomy/foraminotomy and diskectomy, left cervical seven-thoracic one   ROTATOR CUFF REPAIR     THORACIC DISCECTOMY Left 04/03/2014   Procedure: Left Thoracic Seven to Eight, Thoracic Eight to Nine Thoracic Discectomy ;  Surgeon: Mariam Dollar, MD;  Location: [facility];  Service: Neurosurgery;  Laterality: Left;    Family History  Problem Relation Age of Onset   Diabetes Father    Cancer - Lung Father    Hypertension Other    Cancer - Lung Other     Allergies as of 02/21/2023 - Review Complete 02/21/2023  Allergen Reaction Noted   Neosporin + pain relief max st [neomy-bacit-polymyx-pramoxine] Rash 04/19/2012    Social History   Socioeconomic History   Marital status: Divorced    Spouse name: Not on file   Number of children: Not on file   Years of education: Not on file   Highest education level: Not on file  Occupational History    Comment: Truck to storage  Tobacco Use   Smoking status: Every Day    Current packs/day: 0.00    Average packs/day: 0.3 packs/day for 40.0 years (10.0 ttl pk-yrs)    Types: Cigarettes    Start date: 05/09/1982    Last attempt to quit: 05/09/2022    Years since quitting: 0.7   Smokeless tobacco: Never   Tobacco comments:    pt trying to quit on his own  Vaping Use   Vaping status: Former  Substance and Sexual Activity   Alcohol use: Not Currently    Alcohol/week: 12.0 standard drinks of alcohol    Types: 12 Cans of beer per week    Comment: last intake of any alcohol in May 2024 /Either Smirnoff Smash-4 per day or Fireball-10 airplane bottles in a day   Drug use: No   Sexual activity: Yes    Birth control/protection: None  Other Topics Concern   Not on file  Social History Narrative   Not on file   Social Determinants of Health   Financial Resource Strain: Medium Risk (10/07/2022)   Overall Financial Resource Strain (CARDIA)    Difficulty of Paying Living Expenses: Somewhat hard  Food Insecurity: Low Risk   (01/18/2023)   Received from [facility]   Food vital sign    Within the past 12 months, you worried that your food would run out before you got money to buy more: Never true    Within the past 12 months, the food you bought just didn't last and you didn't have money to get more. : Never true  Transportation Needs: Not on file (01/18/2023)  Physical Activity: Not on file  Stress: Stress Concern Present (10/07/2022)   [employer] of Occupational Health - Occupational Stress Questionnaire    Feeling of Stress : To some extent  Social Connections: Not on file     Review of Systems   Gen: Denies fever, chills, anorexia. Denies fatigue, weakness, weight loss.  CV: Denies chest pain, palpitations, syncope, peripheral edema, and claudication. Resp: Denies dyspnea at rest, cough, wheezing, coughing up blood, and pleurisy. GI: See HPI Derm: Denies rash, itching, dry skin Psych: Denies depression, anxiety, memory loss, confusion. No homicidal or suicidal ideation.  Heme: Denies bruising, bleeding, and enlarged  lymph nodes.   Physical Exam   BP 108/73   Pulse 73   Temp 98.6 F (37 C)   Ht 5\' 6"  (1.676 m)   Wt 173 lb 6.4 oz (78.7 kg)   BMI 27.99 kg/m   General:   Alert and oriented. No distress noted. Pleasant and cooperative.  Head:  Normocephalic and atraumatic. Eyes:  Conjuctiva clear.  Scleral icterus Mouth:  Oral mucosa pink and moist. Good dentition. No lesions. Lungs:  Clear to auscultation bilaterally. No wheezes, rales, or rhonchi. No distress.  Heart:  S1, S2 present without murmurs appreciated.  Abdomen:  +BS, soft, non-tender and non-distended. No rebound or guarding. No HSM or masses noted. Rectal: Deferred Msk:  Symmetrical without gross deformities. Normal posture. Extremities:  Without edema. Neurologic:  Alert and  oriented x4 Psych:  Alert and cooperative. Normal mood. Flat affect.    Assessment  Shawn Velazquez is a 57 y.o. male with a history of  alcoholic liver cirrhosis, alcohol abuse, sleep apnea, diabetes, HTN presenting today with complaint of vomiting and fear of variceal bands coming off presenting today for follow up.   Alcoholic cirrhosis: Decompensated. Course has bene complicated by recurrent esophageal variceal bleeding.  Last EGD in March 2024 with 4 columns of grade 2-3 varices without active bleeding and ongoing portal active gastropathy with friability.  Banding was placed at that time.  He has been maintained on Coreg 3.125 mg twice daily with brief discontinuation given hypotension but then restarted after starting on midodrine.  Currently on Lasix 20 mg daily and Aldactone 50 mg daily which is controlling his lower extremity edema.  No evidence of ascites on exam today.  Blood pressure within normal limits today with heart rate in the 70s.  Has continued to have daily nausea with occasional vomiting.  Briefly had alcohol cessation however for the last 3 weeks he has been living back at home by himself and now with his sister and has resumed alcohol consumption.  He reports 2-3 (12 ounce) beers per week however his sister who accompanies him has suspicion that his intake is more than this.  He does have scleral icterus today however no significant jaundice.  Outpatient labs on 7/16 with elevated alkaline phosphatase, AST, and elevated AFP at 10.1.  Sister and patient report they are working on finding inpatient rehabilitation for alcohol treatment and having discussions with insurance to see if this is possible.  Given his ongoing alcohol use, he is not going to be a transplant candidate.  Had pelvic imaging performed in June however prior liver imaging performed was in January with MRI.  Currently overdue for hepatoma screening which is needed in light of increased AFP.  He is maintained on lactulose 10 g once daily with only 1-2 bowel movements daily therefore I advised to .  He has continued on Xifaxan.  Will also continue current  diuretic regimen as he has no significant swelling today.  Is sticking with his protein shakes daily.  I had a thorough discussion with the patient today regarding the importance of alcohol cessation given ongoing use is likely damaging his stomach and putting him at a higher risk for GI bleeding and worsening liver damage.  I strongly advised the patient to consider inpatient treatment for alcohol cessation.   GERD, nausea: Patient continues to experience daily nausea.  At times his emesis is mostly phlegm but has been eating more of a soft/bland diet.  Zofran sometimes does help however other times his  vomiting comes quickly and states it is too late to take the Zofran.  He has been trying to stay hydrated with Gatorade.  Continues with PPI twice daily.  Nausea likely secondary to alcoholic gastritis and increased abdominal pressure given liver disease with mild ascites.  Does have some occasional abdominal pain but usually right lower quadrant as he does have evidence of inguinal hernia.  We have continued Zofran as needed and advised bland diet.   Anemia: Patient reports his energy level initially improved with IV iron infusions.  Had his last infusion at the end of June.  Most recent hemoglobin stable at 13.1 on 7/24.  Platelets stable at 101.  He denies any melena or BRBPR.  Continues to have fatigue and went to sleep very often which is likely contributed by his cirrhosis and ongoing alcohol use.  Colonoscopy is not up-to-date.  He is in need of repeat EGD as stated above for variceal surveillance.  PLAN   Proceed with upper endoscopy with propofol by Dr. Marletta Lor in near future: the risks, benefits, and alternatives have been discussed with the patient in detail. The patient states understanding and desires to proceed. ASA 3  Half normal dose of Lantus night prior to procedure RUQ [place] needed for hepatoma screening Complete alcohol cessation Continue coreg 3.125 mg and midodrine 10 mg  Zofran as  needed Lasix 40 mg and aldactone 50 mg daily. Continue xifaxin Continue PPI Increase lactulose to 10 g BID Continue daily iron therapy Protein 1-2 shakes daily.  Continue folic acid Follow up EKG Follow up in 2 months.     Brooke Bonito, MSN, FNP-BC, AGACNP-BC [facility]

## 2023-02-21 NOTE — Telephone Encounter (Signed)
Pt stated he wanted to wait until after his EKG was done before he scheduled his EGD with Dr. Marletta Lor. He is too call once he is ready. FYI

## 2023-02-21 NOTE — Patient Instructions (Addendum)
We are getting you scheduled for an upper endoscopy in the near future with Dr. Marletta Lor.  Continue your daily iron daily, Lasix 40 mg daily, Aldactone 50 mg daily, and Xifaxan 550 mg twice daily.  I want you to increase your lactulose from 10 g once daily to twice daily.  You can titrate lactulose based off your bowel movements.  Goal is to have 3-4 semiformed bowel movements.  Please let me know within 1 week if you are not having an increase in bowel movements as I would like for you to go to 20 mg twice daily if this occurs.  Continue daily thiamine and folic acid and pantoprazole twice daily.  You can continue Zofran as needed for now.  I would limit this to 3 times a day until we receive the results of your EKG.  Cirrhosis Lifestyle Recommendations:  High-protein diet from a primarily plant-based diet. Avoid red meat.  No raw or undercooked meat, seafood, or shellfish. Low-fat/cholesterol/carbohydrate diet. Limit sodium to no more than 2000 mg/day including everything that you eat and drink. Recommend at least 30 minutes of aerobic and resistance exercise 3 days/week. Limit Tylenol to 2000 mg daily.   Complete cessation of alcohol is recommended, I would continue to reach out with insurance to see if inpatient rehab can be covered!  I also recommend tobacco cessation.  I will see you again for follow-up in 2 months  It was a pleasure to see you today. I want to create trusting relationships with patients. If you receive a survey regarding your visit,  I greatly appreciate you taking time to fill this out on paper or through your MyChart. I value your feedback.  Brooke Bonito, MSN, FNP-BC, AGACNP-BC [facility]

## 2023-02-22 ENCOUNTER — Inpatient Hospital Stay (HOSPITAL_COMMUNITY): Payer: BC Managed Care – PPO

## 2023-02-22 ENCOUNTER — Encounter (HOSPITAL_COMMUNITY)
Admission: EM | Disposition: A | Discharge status: Home / Self Care | Payer: Self-pay | Source: Home / Self Care | Attending: Internal Medicine

## 2023-02-22 ENCOUNTER — Encounter (HOSPITAL_COMMUNITY): Payer: Self-pay

## 2023-02-22 ENCOUNTER — Inpatient Hospital Stay (HOSPITAL_COMMUNITY): Payer: BC Managed Care – PPO | Admitting: Anesthesiology

## 2023-02-22 ENCOUNTER — Inpatient Hospital Stay (HOSPITAL_COMMUNITY)
Admission: EM | Admit: 2023-02-22 | Discharge: 2023-02-28 | DRG: 432 | Disposition: A | Discharge status: Home / Self Care | Payer: BC Managed Care – PPO | Attending: Internal Medicine | Admitting: Internal Medicine

## 2023-02-22 ENCOUNTER — Emergency Department (HOSPITAL_COMMUNITY): Payer: BC Managed Care – PPO

## 2023-02-22 DIAGNOSIS — R188 Other ascites: Secondary | ICD-10-CM

## 2023-02-22 DIAGNOSIS — R17 Unspecified jaundice: Secondary | ICD-10-CM | POA: Diagnosis not present

## 2023-02-22 DIAGNOSIS — K920 Hematemesis: Secondary | ICD-10-CM | POA: Diagnosis not present

## 2023-02-22 DIAGNOSIS — Z8249 Family history of ischemic heart disease and other diseases of the circulatory system: Secondary | ICD-10-CM

## 2023-02-22 DIAGNOSIS — J441 Chronic obstructive pulmonary disease with (acute) exacerbation: Secondary | ICD-10-CM | POA: Diagnosis present

## 2023-02-22 DIAGNOSIS — I8500 Esophageal varices without bleeding: Secondary | ICD-10-CM | POA: Diagnosis not present

## 2023-02-22 DIAGNOSIS — J9811 Atelectasis: Secondary | ICD-10-CM | POA: Diagnosis not present

## 2023-02-22 DIAGNOSIS — I10 Essential (primary) hypertension: Secondary | ICD-10-CM | POA: Diagnosis present

## 2023-02-22 DIAGNOSIS — K7011 Alcoholic hepatitis with ascites: Secondary | ICD-10-CM | POA: Diagnosis present

## 2023-02-22 DIAGNOSIS — Q278 Other specified congenital malformations of peripheral vascular system: Secondary | ICD-10-CM | POA: Diagnosis not present

## 2023-02-22 DIAGNOSIS — E876 Hypokalemia: Secondary | ICD-10-CM | POA: Diagnosis not present

## 2023-02-22 DIAGNOSIS — E871 Hypo-osmolality and hyponatremia: Secondary | ICD-10-CM | POA: Diagnosis present

## 2023-02-22 DIAGNOSIS — I959 Hypotension, unspecified: Secondary | ICD-10-CM | POA: Diagnosis not present

## 2023-02-22 DIAGNOSIS — K922 Gastrointestinal hemorrhage, unspecified: Secondary | ICD-10-CM | POA: Diagnosis not present

## 2023-02-22 DIAGNOSIS — Z794 Long term (current) use of insulin: Secondary | ICD-10-CM

## 2023-02-22 DIAGNOSIS — I8501 Esophageal varices with bleeding: Secondary | ICD-10-CM | POA: Diagnosis not present

## 2023-02-22 DIAGNOSIS — I81 Portal vein thrombosis: Secondary | ICD-10-CM | POA: Diagnosis present

## 2023-02-22 DIAGNOSIS — K766 Portal hypertension: Secondary | ICD-10-CM | POA: Diagnosis present

## 2023-02-22 DIAGNOSIS — E782 Mixed hyperlipidemia: Secondary | ICD-10-CM | POA: Diagnosis not present

## 2023-02-22 DIAGNOSIS — Z79899 Other long term (current) drug therapy: Secondary | ICD-10-CM

## 2023-02-22 DIAGNOSIS — I8511 Secondary esophageal varices with bleeding: Secondary | ICD-10-CM | POA: Diagnosis not present

## 2023-02-22 DIAGNOSIS — E8809 Other disorders of plasma-protein metabolism, not elsewhere classified: Secondary | ICD-10-CM | POA: Diagnosis present

## 2023-02-22 DIAGNOSIS — D62 Acute posthemorrhagic anemia: Secondary | ICD-10-CM | POA: Diagnosis present

## 2023-02-22 DIAGNOSIS — F1010 Alcohol abuse, uncomplicated: Secondary | ICD-10-CM | POA: Diagnosis present

## 2023-02-22 DIAGNOSIS — K7469 Other cirrhosis of liver: Secondary | ICD-10-CM | POA: Diagnosis not present

## 2023-02-22 DIAGNOSIS — E119 Type 2 diabetes mellitus without complications: Secondary | ICD-10-CM

## 2023-02-22 DIAGNOSIS — Z9689 Presence of other specified functional implants: Secondary | ICD-10-CM | POA: Diagnosis not present

## 2023-02-22 DIAGNOSIS — E1165 Type 2 diabetes mellitus with hyperglycemia: Secondary | ICD-10-CM | POA: Diagnosis not present

## 2023-02-22 DIAGNOSIS — E1365 Other specified diabetes mellitus with hyperglycemia: Secondary | ICD-10-CM | POA: Diagnosis not present

## 2023-02-22 DIAGNOSIS — J439 Emphysema, unspecified: Secondary | ICD-10-CM | POA: Diagnosis not present

## 2023-02-22 DIAGNOSIS — F17210 Nicotine dependence, cigarettes, uncomplicated: Secondary | ICD-10-CM | POA: Diagnosis present

## 2023-02-22 DIAGNOSIS — R918 Other nonspecific abnormal finding of lung field: Secondary | ICD-10-CM | POA: Diagnosis not present

## 2023-02-22 DIAGNOSIS — K700 Alcoholic fatty liver: Secondary | ICD-10-CM | POA: Diagnosis present

## 2023-02-22 DIAGNOSIS — D6959 Other secondary thrombocytopenia: Secondary | ICD-10-CM | POA: Diagnosis present

## 2023-02-22 DIAGNOSIS — K810 Acute cholecystitis: Secondary | ICD-10-CM | POA: Diagnosis not present

## 2023-02-22 DIAGNOSIS — K7460 Unspecified cirrhosis of liver: Secondary | ICD-10-CM

## 2023-02-22 DIAGNOSIS — J918 Pleural effusion in other conditions classified elsewhere: Secondary | ICD-10-CM | POA: Diagnosis not present

## 2023-02-22 DIAGNOSIS — D7589 Other specified diseases of blood and blood-forming organs: Secondary | ICD-10-CM | POA: Diagnosis present

## 2023-02-22 DIAGNOSIS — F419 Anxiety disorder, unspecified: Secondary | ICD-10-CM | POA: Diagnosis present

## 2023-02-22 DIAGNOSIS — K3189 Other diseases of stomach and duodenum: Secondary | ICD-10-CM | POA: Diagnosis not present

## 2023-02-22 DIAGNOSIS — K7010 Alcoholic hepatitis without ascites: Secondary | ICD-10-CM | POA: Diagnosis not present

## 2023-02-22 DIAGNOSIS — K7031 Alcoholic cirrhosis of liver with ascites: Secondary | ICD-10-CM | POA: Diagnosis not present

## 2023-02-22 DIAGNOSIS — Z5986 Financial insecurity: Secondary | ICD-10-CM

## 2023-02-22 DIAGNOSIS — G4730 Sleep apnea, unspecified: Secondary | ICD-10-CM | POA: Diagnosis not present

## 2023-02-22 DIAGNOSIS — K7290 Hepatic failure, unspecified without coma: Secondary | ICD-10-CM | POA: Diagnosis not present

## 2023-02-22 DIAGNOSIS — Z7951 Long term (current) use of inhaled steroids: Secondary | ICD-10-CM

## 2023-02-22 DIAGNOSIS — K828 Other specified diseases of gallbladder: Secondary | ICD-10-CM | POA: Diagnosis present

## 2023-02-22 DIAGNOSIS — J449 Chronic obstructive pulmonary disease, unspecified: Secondary | ICD-10-CM | POA: Diagnosis not present

## 2023-02-22 DIAGNOSIS — Z833 Family history of diabetes mellitus: Secondary | ICD-10-CM

## 2023-02-22 DIAGNOSIS — K219 Gastro-esophageal reflux disease without esophagitis: Secondary | ICD-10-CM | POA: Diagnosis present

## 2023-02-22 DIAGNOSIS — J90 Pleural effusion, not elsewhere classified: Secondary | ICD-10-CM | POA: Diagnosis not present

## 2023-02-22 DIAGNOSIS — E785 Hyperlipidemia, unspecified: Secondary | ICD-10-CM | POA: Diagnosis present

## 2023-02-22 DIAGNOSIS — R791 Abnormal coagulation profile: Secondary | ICD-10-CM | POA: Diagnosis present

## 2023-02-22 HISTORY — DX: Esophageal varices without bleeding: I85.00

## 2023-02-22 HISTORY — PX: ESOPHAGEAL BANDING: SHX5518

## 2023-02-22 HISTORY — PX: ESOPHAGOGASTRODUODENOSCOPY (EGD) WITH PROPOFOL: SHX5813

## 2023-02-22 LAB — GLUCOSE, CAPILLARY
Glucose-Capillary: 181 mg/dL — ABNORMAL HIGH (ref 70–99)
Glucose-Capillary: 180 mg/dL — ABNORMAL HIGH (ref 70–99)
Glucose-Capillary: 145 mg/dL — ABNORMAL HIGH (ref 70–99)

## 2023-02-22 LAB — CBC WITH DIFFERENTIAL/PLATELET
Abs Immature Granulocytes: 0.12 10*3/uL — ABNORMAL HIGH (ref 0.00–0.07)
Basophils Absolute: 0.1 10*3/uL (ref 0.0–0.1)
Basophils Relative: 1 %
Eosinophils Absolute: 0.3 10*3/uL (ref 0.0–0.5)
Eosinophils Relative: 2 %
HCT: 32.1 % — ABNORMAL LOW (ref 39.0–52.0)
Hemoglobin: 11.5 g/dL — ABNORMAL LOW (ref 13.0–17.0)
Immature Granulocytes: 1 %
Lymphocytes Relative: 16 %
Lymphs Abs: 2 10*3/uL (ref 0.7–4.0)
MCH: 35.7 pg — ABNORMAL HIGH (ref 26.0–34.0)
MCHC: 35.8 g/dL (ref 30.0–36.0)
MCV: 99.7 fL (ref 80.0–100.0)
Monocytes Absolute: 1.2 10*3/uL — ABNORMAL HIGH (ref 0.1–1.0)
Monocytes Relative: 10 %
Neutro Abs: 9 10*3/uL — ABNORMAL HIGH (ref 1.7–7.7)
Neutrophils Relative %: 70 %
Platelets: 110 10*3/uL — ABNORMAL LOW (ref 150–400)
RBC: 3.22 MIL/uL — ABNORMAL LOW (ref 4.22–5.81)
RDW: 14.8 % (ref 11.5–15.5)
WBC: 12.7 10*3/uL — ABNORMAL HIGH (ref 4.0–10.5)
nRBC: 0 % (ref 0.0–0.2)

## 2023-02-22 LAB — URINALYSIS, ROUTINE W REFLEX MICROSCOPIC
Bilirubin Urine: NEGATIVE
Glucose, UA: NEGATIVE mg/dL
Hgb urine dipstick: NEGATIVE
Ketones, ur: NEGATIVE mg/dL
Leukocytes,Ua: NEGATIVE
Nitrite: NEGATIVE
Protein, ur: NEGATIVE mg/dL
Specific Gravity, Urine: 1.015 (ref 1.005–1.030)
pH: 5 (ref 5.0–8.0)

## 2023-02-22 LAB — I-STAT CHEM 8, ED
BUN: 16 mg/dL (ref 6–20)
Calcium, Ion: 0.97 mmol/L — ABNORMAL LOW (ref 1.15–1.40)
Chloride: 78 mmol/L — ABNORMAL LOW (ref 98–111)
Creatinine, Ser: 1.1 mg/dL (ref 0.61–1.24)
Glucose, Bld: 217 mg/dL — ABNORMAL HIGH (ref 70–99)
HCT: 34 % — ABNORMAL LOW (ref 39.0–52.0)
Hemoglobin: 11.6 g/dL — ABNORMAL LOW (ref 13.0–17.0)
Potassium: 2.8 mmol/L — ABNORMAL LOW (ref 3.5–5.1)
Sodium: 125 mmol/L — ABNORMAL LOW (ref 135–145)
TCO2: 35 mmol/L — ABNORMAL HIGH (ref 22–32)

## 2023-02-22 LAB — HEMOGLOBIN A1C
Hgb A1c MFr Bld: 6.7 % — ABNORMAL HIGH (ref 4.8–5.6)
Mean Plasma Glucose: 145.59 mg/dL

## 2023-02-22 LAB — HEMOGLOBIN AND HEMATOCRIT, BLOOD
HCT: 26.9 % — ABNORMAL LOW (ref 39.0–52.0)
HCT: 24.7 % — ABNORMAL LOW (ref 39.0–52.0)
HCT: 25.6 % — ABNORMAL LOW (ref 39.0–52.0)
Hemoglobin: 9.7 g/dL — ABNORMAL LOW (ref 13.0–17.0)
Hemoglobin: 8.9 g/dL — ABNORMAL LOW (ref 13.0–17.0)
Hemoglobin: 9.1 g/dL — ABNORMAL LOW (ref 13.0–17.0)

## 2023-02-22 LAB — TYPE AND SCREEN
ABO/RH(D): A POS
Antibody Screen: NEGATIVE

## 2023-02-22 LAB — LIPASE, BLOOD: Lipase: 61 U/L — ABNORMAL HIGH (ref 11–51)

## 2023-02-22 LAB — PROTIME-INR
INR: 2.2 — ABNORMAL HIGH (ref 0.8–1.2)
Prothrombin Time: 25 seconds — ABNORMAL HIGH (ref 11.4–15.2)

## 2023-02-22 SURGERY — ESOPHAGOGASTRODUODENOSCOPY (EGD) WITH PROPOFOL
Anesthesia: General

## 2023-02-22 MED ORDER — PHENYLEPHRINE HCL (PRESSORS) 10 MG/ML IV SOLN
INTRAVENOUS | Status: DC | PRN
  Administered 2023-02-22 (×3): 80 ug via INTRAVENOUS

## 2023-02-22 MED ORDER — SODIUM CHLORIDE 0.9 % IV BOLUS
1000.0000 mL | Freq: Once | INTRAVENOUS | Status: AC
  Administered 2023-02-22: 1000 mL via INTRAVENOUS

## 2023-02-22 MED ORDER — SUCCINYLCHOLINE 20MG/ML (10ML) SYRINGE FOR MEDFUSION PUMP - OPTIME
INTRAMUSCULAR | Status: DC | PRN
  Administered 2023-02-22: 100 mg via INTRAVENOUS

## 2023-02-22 MED ORDER — VITAMIN K1 10 MG/ML IJ SOLN
10.0000 mg | Freq: Once | INTRAVENOUS | Status: AC
  Administered 2023-02-22: 10 mg via INTRAVENOUS
  Filled 2023-02-22: qty 1

## 2023-02-22 MED ORDER — LIDOCAINE HCL (CARDIAC) PF 50 MG/5ML IV SOSY
PREFILLED_SYRINGE | INTRAVENOUS | Status: DC | PRN
  Administered 2023-02-22: 50 mg via INTRAVENOUS

## 2023-02-22 MED ORDER — POTASSIUM CHLORIDE 10 MEQ/100ML IV SOLN
10.0000 meq | INTRAVENOUS | Status: AC
  Administered 2023-02-22 (×4): 10 meq via INTRAVENOUS

## 2023-02-22 MED ORDER — PROCHLORPERAZINE EDISYLATE 10 MG/2ML IJ SOLN
10.0000 mg | Freq: Once | INTRAMUSCULAR | Status: AC
  Administered 2023-02-22: 10 mg via INTRAVENOUS
  Filled 2023-02-22: qty 2

## 2023-02-22 MED ORDER — PROCHLORPERAZINE EDISYLATE 10 MG/2ML IJ SOLN
10.0000 mg | Freq: Four times a day (QID) | INTRAMUSCULAR | Status: DC | PRN
  Administered 2023-02-27: 10 mg via INTRAVENOUS
  Filled 2023-02-22: qty 2

## 2023-02-22 MED ORDER — THIAMINE HCL 100 MG/ML IJ SOLN
100.0000 mg | Freq: Every day | INTRAMUSCULAR | Status: DC
  Administered 2023-02-22 – 2023-02-28 (×7): 100 mg via INTRAVENOUS
  Filled 2023-02-22 (×7): qty 2

## 2023-02-22 MED ORDER — RIFAXIMIN 550 MG PO TABS
550.0000 mg | ORAL_TABLET | Freq: Two times a day (BID) | ORAL | Status: DC
  Administered 2023-02-22 – 2023-02-28 (×12): 550 mg via ORAL
  Filled 2023-02-22 (×12): qty 1

## 2023-02-22 MED ORDER — ADULT MULTIVITAMIN W/MINERALS CH
1.0000 | ORAL_TABLET | Freq: Every day | ORAL | Status: DC
  Administered 2023-02-23 – 2023-02-28 (×6): 1 via ORAL
  Filled 2023-02-22 (×6): qty 1

## 2023-02-22 MED ORDER — PANTOPRAZOLE 80MG IVPB - SIMPLE MED: 80.0000 mg | Freq: Once | INTRAVENOUS | Status: DC

## 2023-02-22 MED ORDER — INSULIN ASPART 100 UNIT/ML IJ SOLN
0.0000 [IU] | Freq: Three times a day (TID) | INTRAMUSCULAR | Status: DC
  Administered 2023-02-22 – 2023-02-23 (×2): 2 [IU] via SUBCUTANEOUS
  Administered 2023-02-23: 3 [IU] via SUBCUTANEOUS
  Administered 2023-02-24: 7 [IU] via SUBCUTANEOUS
  Administered 2023-02-24: 3 [IU] via SUBCUTANEOUS
  Administered 2023-02-24: 2 [IU] via SUBCUTANEOUS
  Administered 2023-02-25: 5 [IU] via SUBCUTANEOUS
  Administered 2023-02-25: 1 [IU] via SUBCUTANEOUS
  Administered 2023-02-25: 5 [IU] via SUBCUTANEOUS
  Administered 2023-02-26: 3 [IU] via SUBCUTANEOUS

## 2023-02-22 MED ORDER — MIDODRINE HCL 5 MG PO TABS
5.0000 mg | ORAL_TABLET | Freq: Three times a day (TID) | ORAL | Status: DC
  Administered 2023-02-22 – 2023-02-28 (×18): 5 mg via ORAL
  Filled 2023-02-22 (×18): qty 1

## 2023-02-22 MED ORDER — OCTREOTIDE LOAD VIA INFUSION
100.0000 ug | Freq: Once | INTRAVENOUS | Status: AC
  Administered 2023-02-22: 100 ug via INTRAVENOUS
  Filled 2023-02-22: qty 50

## 2023-02-22 MED ORDER — LORAZEPAM 1 MG PO TABS: 1.0000 mg | ORAL_TABLET | ORAL | Status: AC | PRN

## 2023-02-22 MED ORDER — INSULIN GLARGINE-YFGN 100 UNIT/ML ~~LOC~~ SOLN
10.0000 [IU] | Freq: Every day | SUBCUTANEOUS | Status: DC
  Administered 2023-02-22 – 2023-02-25 (×4): 10 [IU] via SUBCUTANEOUS
  Filled 2023-02-22 (×6): qty 0.1

## 2023-02-22 MED ORDER — ACETAMINOPHEN 325 MG PO TABS
650.0000 mg | ORAL_TABLET | Freq: Four times a day (QID) | ORAL | Status: DC | PRN
  Administered 2023-02-24: 650 mg via ORAL
  Filled 2023-02-22: qty 2

## 2023-02-22 MED ORDER — PANTOPRAZOLE SODIUM 40 MG IV SOLR
40.0000 mg | Freq: Two times a day (BID) | INTRAVENOUS | Status: DC
  Administered 2023-02-25 – 2023-02-26 (×2): 40 mg via INTRAVENOUS
  Filled 2023-02-22 (×2): qty 10

## 2023-02-22 MED ORDER — POTASSIUM CHLORIDE 10 MEQ/100ML IV SOLN
10.0000 meq | Freq: Once | INTRAVENOUS | Status: DC
  Filled 2023-02-22: qty 100

## 2023-02-22 MED ORDER — MORPHINE SULFATE (PF) 2 MG/ML IV SOLN
2.0000 mg | Freq: Once | INTRAVENOUS | Status: AC
  Administered 2023-02-22: 2 mg via INTRAVENOUS
  Filled 2023-02-22: qty 1

## 2023-02-22 MED ORDER — FLUOXETINE HCL 20 MG PO CAPS
20.0000 mg | ORAL_CAPSULE | Freq: Two times a day (BID) | ORAL | Status: DC
  Administered 2023-02-22 – 2023-02-28 (×12): 20 mg via ORAL
  Filled 2023-02-22 (×12): qty 1

## 2023-02-22 MED ORDER — LACTATED RINGERS IV SOLN: INTRAVENOUS | Status: DC

## 2023-02-22 MED ORDER — PANTOPRAZOLE INFUSION (NEW) - SIMPLE MED
8.0000 mg/h | INTRAVENOUS | Status: AC
  Administered 2023-02-22 – 2023-02-24 (×6): 8 mg/h via INTRAVENOUS
  Filled 2023-02-22: qty 80
  Filled 2023-02-22 (×2): qty 100
  Filled 2023-02-22 (×2): qty 80
  Filled 2023-02-22: qty 100
  Filled 2023-02-22: qty 80
  Filled 2023-02-22: qty 100
  Filled 2023-02-22: qty 80

## 2023-02-22 MED ORDER — SODIUM CHLORIDE 0.9 % IV SOLN
50.0000 ug/h | INTRAVENOUS | Status: AC
  Administered 2023-02-22 – 2023-02-24 (×6): 50 ug/h via INTRAVENOUS
  Filled 2023-02-22 (×8): qty 1

## 2023-02-22 MED ORDER — PROSOURCE PLUS PO LIQD
30.0000 mL | Freq: Two times a day (BID) | ORAL | Status: DC
  Administered 2023-02-23 – 2023-02-24 (×2): 30 mL via ORAL
  Filled 2023-02-22 (×2): qty 30

## 2023-02-22 MED ORDER — LACTULOSE 10 GM/15ML PO SOLN
10.0000 g | Freq: Two times a day (BID) | ORAL | Status: DC
  Administered 2023-02-22 – 2023-02-24 (×4): 10 g via ORAL
  Filled 2023-02-22 (×4): qty 30

## 2023-02-22 MED ORDER — SODIUM CHLORIDE 0.9 % IV SOLN
2.0000 g | INTRAVENOUS | Status: AC
  Administered 2023-02-22 – 2023-02-26 (×5): 2 g via INTRAVENOUS
  Filled 2023-02-22 (×5): qty 20

## 2023-02-22 MED ORDER — HYDROMORPHONE HCL 1 MG/ML IJ SOLN: 0.5000 mg | INTRAMUSCULAR | Status: DC | PRN

## 2023-02-22 MED ORDER — FOLIC ACID 5 MG/ML IJ SOLN
1.0000 mg | Freq: Every day | INTRAMUSCULAR | Status: DC
  Administered 2023-02-22 – 2023-02-28 (×7): 1 mg via INTRAVENOUS
  Filled 2023-02-22 (×11): qty 0.2

## 2023-02-22 MED ORDER — SODIUM CHLORIDE 0.9 % IV SOLN: INTRAVENOUS | Status: DC

## 2023-02-22 MED ORDER — BOOST / RESOURCE BREEZE PO LIQD CUSTOM
1.0000 | Freq: Three times a day (TID) | ORAL | Status: DC
  Administered 2023-02-23 – 2023-02-24 (×2): 1 via ORAL

## 2023-02-22 MED ORDER — INSULIN ASPART 100 UNIT/ML IJ SOLN
0.0000 [IU] | Freq: Every day | INTRAMUSCULAR | Status: DC
  Administered 2023-02-23: 2 [IU] via SUBCUTANEOUS
  Administered 2023-02-24: 3 [IU] via SUBCUTANEOUS
  Administered 2023-02-25: 4 [IU] via SUBCUTANEOUS

## 2023-02-22 MED ORDER — POTASSIUM CHLORIDE 10 MEQ/100ML IV SOLN
10.0000 meq | Freq: Once | INTRAVENOUS | Status: AC
  Administered 2023-02-22: 10 meq via INTRAVENOUS
  Filled 2023-02-22: qty 100

## 2023-02-22 MED ORDER — METOCLOPRAMIDE HCL 5 MG/ML IJ SOLN
10.0000 mg | Freq: Once | INTRAMUSCULAR | Status: AC
  Administered 2023-02-22: 10 mg via INTRAVENOUS
  Filled 2023-02-22: qty 2

## 2023-02-22 MED ORDER — EPHEDRINE SULFATE (PRESSORS) 50 MG/ML IJ SOLN
INTRAMUSCULAR | Status: DC | PRN
  Administered 2023-02-22: 10 mg via INTRAVENOUS

## 2023-02-22 MED ORDER — PANTOPRAZOLE SODIUM 40 MG IV SOLR
40.0000 mg | Freq: Once | INTRAVENOUS | Status: AC
  Administered 2023-02-22: 40 mg via INTRAVENOUS
  Filled 2023-02-22: qty 10

## 2023-02-22 MED ORDER — LORAZEPAM 2 MG/ML IJ SOLN
1.0000 mg | INTRAMUSCULAR | Status: AC | PRN
  Administered 2023-02-23: 1 mg via INTRAVENOUS
  Filled 2023-02-22: qty 1

## 2023-02-22 MED ORDER — ACETAMINOPHEN 650 MG RE SUPP: 650.0000 mg | Freq: Four times a day (QID) | RECTAL | Status: DC | PRN

## 2023-02-22 MED ORDER — PROPOFOL 10 MG/ML IV BOLUS
INTRAVENOUS | Status: DC | PRN
  Administered 2023-02-22: 150 mg via INTRAVENOUS

## 2023-02-22 MED ORDER — BUSPIRONE HCL 5 MG PO TABS
5.0000 mg | ORAL_TABLET | Freq: Two times a day (BID) | ORAL | Status: DC
  Administered 2023-02-22 – 2023-02-28 (×12): 5 mg via ORAL
  Filled 2023-02-22 (×12): qty 1

## 2023-02-22 NOTE — ED Notes (Signed)
Paged Levon Hedger to Dr. [email or web address]([phone])

## 2023-02-22 NOTE — ED Provider Notes (Signed)
[place] EMERGENCY DEPARTMENT AT [facility] Provider Note   CSN: [id number] Arrival date & time: 02/22/23  9983     History {Add pertinent medical, surgical, social history, OB history to HPI:1} Chief Complaint  Patient presents with   Hematemesis    Shawn Velazquez is a 57 y.o. male.  Patient has varices and has been vomiting well for the last day.   Emesis      Home Medications Prior to Admission medications   Medication Sig Start Date End Date Taking? Authorizing Provider  albuterol (VENTOLIN HFA) 108 (90 Base) MCG/ACT inhaler Inhale 2 puffs into the lungs every 6 (six) hours as needed for wheezing or shortness of breath. 12/08/22   Shon Hale, MD  busPIRone (BUSPAR) 5 MG tablet Take 1 tablet (5 mg total) by mouth 2 (two) times daily. 12/08/22   Shon Hale, MD  carvedilol (COREG) 3.125 MG tablet Take 1 tablet (3.125 mg total) by mouth 2 (two) times daily with a meal. 12/09/22 03/09/23  Emokpae, Courage, MD  Continuous Glucose Sensor (DEXCOM G7 SENSOR) MISC Inject 1 application  into the skin as directed. Change sensor every 10 days as directed. 12/23/22   Dani Gobble, NP  ferrous sulfate 324 MG TBEC Take 1 tablet (324 mg total) by mouth daily with breakfast. 12/08/22   Mariea Clonts, Courage, MD  FLUoxetine (PROZAC) 10 MG capsule TAKE 4 CAPSULES BY MOUTH EVERY DAY Patient taking differently: Take 10 mg by mouth See admin instructions. TAKE 2 CAPSULES IN THE MORNING AND 2 CAPSULES EVERY EVENING. 02/17/22   Paseda, Baird Kay, FNP  folic acid (FOLVITE) 1 MG tablet TAKE 1 TABLET BY MOUTH DAILY 01/10/23   Aida Raider, NP  furosemide (LASIX) 40 MG tablet Take 1 tablet (40 mg total) by mouth daily. 12/11/22 03/11/23  Shon Hale, MD  gabapentin (NEURONTIN) 100 MG capsule Take 1 tablet by mouth daily. 10/05/22   [provider]  glucose blood test strip Use as instructed to monitor glucose 4 times daily 12/09/21   Dani Gobble, NP  insulin glargine  (LANTUS SOLOSTAR) 100 UNIT/ML Solostar Pen Inject 30 Units into the skin at bedtime. 12/23/22   Dani Gobble, NP  insulin lispro (HUMALOG KWIKPEN) 100 UNIT/ML KwikPen Inject 5-11 Units into the skin 3 (three) times daily. 12/23/22   Dani Gobble, NP  Insulin Pen Needle (PEN NEEDLES) 32G X 4 MM MISC 1 each by Does not apply route in the morning, at noon, in the evening, and at bedtime. Use to inject insulin 4 times daily 05/12/22   Dani Gobble, NP  lactulose (CHRONULAC) 10 GM/15ML solution Take 15 mLs (10 g total) by mouth 2 (two) times daily. 02/21/23   Aida Raider, NP  levocetirizine (XYZAL) 5 MG tablet Take 1 tablet by mouth daily. 10/05/22   [provider]  midodrine (PROAMATINE) 5 MG tablet Take 1 tablet (5 mg total) by mouth 3 (three) times daily with meals. To help Prevent Low Blood Pressure 12/08/22   Shon Hale, MD  Multiple Vitamin (MULTIVITAMIN) tablet Take 1 tablet by mouth daily.    [provider]  ondansetron (ZOFRAN-ODT) 4 MG disintegrating tablet Take 1 tablet (4 mg total) by mouth every 8 (eight) hours as needed for nausea or vomiting. 12/21/22   Aida Raider, NP  pantoprazole (PROTONIX) 40 MG tablet Take 1 tablet (40 mg total) by mouth 2 (two) times daily. 12/08/22 06/06/23  Shon Hale, MD  spironolactone (ALDACTONE) 50 MG  tablet Take 1 tablet (50 mg total) by mouth daily. 12/09/22 12/04/23  Shon Hale, MD  thiamine (VITAMIN B-1) 100 MG tablet Take 1 tablet (100 mg total) by mouth daily. 12/08/22   Shon Hale, MD  XIFAXAN 550 MG TABS tablet TAKE 1 TABLET BY MOUTH TWICE DAILY 02/14/23 02/14/24  Aida Raider, NP      Allergies    Neosporin + pain relief max st [neomy-bacit-polymyx-pramoxine]    Review of Systems   Review of Systems  Gastrointestinal:  Positive for vomiting.    Physical Exam Updated Vital Signs BP (!) 147/73   Pulse 96   Temp 97.8 F (36.6 C) (Oral)   Resp (!) 25   Ht 5\' 6"  (1.676 m)   Wt 78.5 kg    SpO2 94%   BMI 27.92 kg/m  Physical Exam  ED Results / Procedures / Treatments   Labs (all labs ordered are listed, but only abnormal results are displayed) Labs Reviewed  CBC WITH DIFFERENTIAL/PLATELET - Abnormal; Notable for the following components:      Result Value   WBC 12.7 (*)    RBC 3.22 (*)    Hemoglobin 11.5 (*)    HCT 32.1 (*)    MCH 35.7 (*)    Platelets 110 (*)    Neutro Abs 9.0 (*)    Monocytes Absolute 1.2 (*)    Abs Immature Granulocytes 0.12 (*)    All other components within normal limits  COMPREHENSIVE METABOLIC PANEL - Abnormal; Notable for the following components:   Sodium 123 (*)    Potassium 2.7 (*)    Chloride 77 (*)    Glucose, Bld 220 (*)    Calcium 8.4 (*)    Albumin 2.6 (*)    AST 113 (*)    Alkaline Phosphatase 222 (*)    Total Bilirubin 10.3 (*)    All other components within normal limits  PROTIME-INR - Abnormal; Notable for the following components:   Prothrombin Time 25.0 (*)    INR 2.2 (*)    All other components within normal limits  LIPASE, BLOOD - Abnormal; Notable for the following components:   Lipase 61 (*)    All other components within normal limits  I-STAT CHEM 8, ED - Abnormal; Notable for the following components:   Sodium 125 (*)    Potassium 2.8 (*)    Chloride 78 (*)    Glucose, Bld 217 (*)    Calcium, Ion 0.97 (*)    TCO2 35 (*)    Hemoglobin 11.6 (*)    HCT 34.0 (*)    All other components within normal limits  HEMOGLOBIN AND HEMATOCRIT, BLOOD  HEMOGLOBIN AND HEMATOCRIT, BLOOD  HEMOGLOBIN AND HEMATOCRIT, BLOOD  HEMOGLOBIN A1C  TYPE AND SCREEN    EKG None  Radiology No results found.  Procedures Procedures  {Document cardiac monitor, telemetry assessment procedure when appropriate:1}  Medications Ordered in ED Medications  octreotide (SANDOSTATIN) 2 mcg/mL load via infusion 100 mcg (100 mcg Intravenous Bolus from Bag 02/22/23 0932)    And  octreotide (SANDOSTATIN) 500 mcg in sodium chloride 0.9 %  250 mL (2 mcg/mL) infusion (50 mcg/hr Intravenous New Bag/Given 02/22/23 0927)  metoCLOPramide (REGLAN) injection 10 mg (has no administration in time range)  pantoprozole (PROTONIX) 80 mg /NS 100 mL infusion (has no administration in time range)  pantoprazole (PROTONIX) injection 40 mg (has no administration in time range)  potassium chloride 10 mEq in 100 mL IVPB (has no administration in time range)  potassium chloride 10 mEq in 100 mL IVPB (has no administration in time range)  sodium chloride 0.9 % bolus 1,000 mL (has no administration in time range)  prochlorperazine (COMPAZINE) injection 10 mg (has no administration in time range)  morphine (PF) 2 MG/ML injection 2 mg (has no administration in time range)  prochlorperazine (COMPAZINE) injection 10 mg (has no administration in time range)  phytonadione (VITAMIN K) 10 mg in dextrose 5 % 50 mL IVPB (has no administration in time range)  cefTRIAXone (ROCEPHIN) 2 g in sodium chloride 0.9 % 100 mL IVPB (has no administration in time range)  potassium chloride 10 mEq in 100 mL IVPB (has no administration in time range)  sodium chloride 0.9 % bolus 1,000 mL (0 mLs Intravenous Stopped 02/22/23 1048)  pantoprazole (PROTONIX) injection 40 mg (40 mg Intravenous Given 02/22/23 0907)    ED Course/ Medical Decision Making/ A&P   {  CRITICAL CARE Performed by: Bethann Berkshire Total critical care time: 45 minutes Critical care time was exclusive of separately billable procedures and treating other patients. Critical care was necessary to treat or prevent imminent or life-threatening deterioration. Critical care was time spent personally by me on the following activities: development of treatment plan with patient and/or surrogate as well as nursing, discussions with consultants, evaluation of patient's response to treatment, examination of patient, obtaining history from patient or surrogate, ordering and performing treatments and interventions, ordering  and review of laboratory studies, ordering and review of radiographic studies, pulse oximetry and re-evaluation of patient's condition.  Click here for ABCD2, HEART and other calculatorsREFRESH Note before signing :1}                              Medical Decision Making Amount and/or Complexity of Data Reviewed Labs: ordered.  Risk Prescription drug management. Decision regarding hospitalization.   Patient vomiting blood with significant cirrhosis and varices.  He is admitted to medicine and will be scoped by GI  {Document critical care time when appropriate:1} {Document review of labs and clinical decision tools ie heart score, Chads2Vasc2 etc:1}  {Document your independent review of radiology images, and any outside records:1} {Document your discussion with family members, caretakers, and with consultants:1} {Document social determinants of health affecting pt's care:1} {Document your decision making why or why not admission, treatments were needed:1} Final Clinical Impression(s) / ED Diagnoses Final diagnoses:  Upper GI bleed    Rx / DC Orders ED Discharge Orders     None

## 2023-02-22 NOTE — H&P (Signed)
History and Physical    Patient: Shawn Velazquez:[id number] DOB: 1965/11/21 DOA: 02/22/2023 DOS: the patient was seen and examined on 02/22/2023 PCP: [name], MD  Patient coming from: Home  Chief Complaint:  Chief Complaint  Patient presents with   Hematemesis   HPI: Shawn Velazquez is a 57 y.o. male with medical history significant of alcoholic cirrhosis, fatty liver, gastroesophageal reflux disease, hyperlipidemia, type 2 diabetes on insulin and esophageal varices; presented to the hospital secondary to nausea/vomiting and abdominal pain.  Patient's symptoms have been present for the last 2 days and worsening.  Reports initially having episodes of vomiting with subsequent hematemesis events (more than 15 episodes at home).  Patient reports noticing 1 episode of melena on the day of admission.  Patient reports no chest pain, no dysuria/hematuria, focal weaknesses, sick contacts or any other complaints.  Workup in the ED demonstrating decrease of patient's hemoglobin level from baseline by 2 g; male 3.0 score 29 at time of admission.  Elevated bilirubin levels and complaints of feeling dizzy/slightly lightheaded.  GI service consulted with plan for endoscopic evaluation and [facility] has been called to place patient in the hospital for further evaluation and management.  Review of Systems: As mentioned in the history of present illness. All other systems reviewed and are negative. Past Medical History:  Diagnosis Date   Anxiety    Arthritis    affects hands, shoulder, neck, knees, hips, ankles, toes   Chronic back pain    Diabetes mellitus    diet controlled   Eczema    Esophageal varices (HCC)    Fatty liver, alcoholic    GERD (gastroesophageal reflux disease)    Glaucoma    slight case   Hypertension    Iron deficiency anemia due to chronic blood loss 11/25/2022   Psoriasis    Sleep apnea    Substance abuse (HCC)    Vertigo 09/11/2017   Wears glasses    Past  Surgical History:  Procedure Laterality Date   COLONOSCOPY WITH PROPOFOL N/A 04/05/2022   Procedure: COLONOSCOPY WITH PROPOFOL;  Surgeon: Lanelle Bal, DO;  Location: [facility];  Service: Endoscopy;  Laterality: N/A;  10:45 AM,unable to reach pt to move up   ESOPHAGEAL BANDING N/A 08/26/2022   Procedure: ESOPHAGEAL BANDING;  Surgeon: Lanelle Bal, DO;  Location: [facility];  Service: Endoscopy;  Laterality: N/A;   ESOPHAGEAL BANDING  09/24/2022   Procedure: ESOPHAGEAL BANDING;  Surgeon: Corbin Ade, MD;  Location: [facility];  Service: Endoscopy;;   ESOPHAGOGASTRODUODENOSCOPY (EGD) WITH PROPOFOL N/A 08/26/2022   Procedure: ESOPHAGOGASTRODUODENOSCOPY (EGD) WITH PROPOFOL;  Surgeon: Lanelle Bal, DO;  Location: [facility];  Service: Endoscopy;  Laterality: N/A;   ESOPHAGOGASTRODUODENOSCOPY (EGD) WITH PROPOFOL N/A 09/24/2022   Procedure: ESOPHAGOGASTRODUODENOSCOPY (EGD) WITH PROPOFOL;  Surgeon: Corbin Ade, MD;  Location: [facility];  Service: Endoscopy;  Laterality: N/A;  hematemesis, esophageal varices, melena   MULTIPLE TOOTH EXTRACTIONS     POLYPECTOMY  04/05/2022   Procedure: POLYPECTOMY;  Surgeon: Lanelle Bal, DO;  Location: [facility];  Service: Endoscopy;;   POSTERIOR CERVICAL LAMINECTOMY  04/21/2012   Procedure: POSTERIOR CERVICAL LAMINECTOMY;  Surgeon: Mariam Dollar, MD;  Location: [facility];  Service: Neurosurgery;  Laterality: Left;  Posterior Cervical laminectomy/foraminotomy and diskectomy, left cervical seven-thoracic one   ROTATOR CUFF REPAIR     THORACIC DISCECTOMY Left 04/03/2014   Procedure: Left Thoracic Seven to Eight, Thoracic Eight to  Nine Thoracic Discectomy ;  Surgeon: Mariam Dollar, MD;  Location: [facility];  Service: Neurosurgery;  Laterality: Left;   Social History:  reports that he has been smoking cigarettes. He started smoking about 40 years ago. He has a 10 pack-year smoking history. He has never used smokeless tobacco. He  reports that he does not currently use alcohol after a past usage of about 12.0 standard drinks of alcohol per week. He reports that he does not use drugs.  Allergies  Allergen Reactions   Neosporin + Pain Relief Max St [Neomy-Bacit-Polymyx-Pramoxine] Rash    Family History  Problem Relation Age of Onset   Diabetes Father    Cancer - Lung Father    Hypertension Other    Cancer - Lung Other     Prior to Admission medications   Medication Sig Start Date End Date Taking? Authorizing Provider  albuterol (VENTOLIN HFA) 108 (90 Base) MCG/ACT inhaler Inhale 2 puffs into the lungs every 6 (six) hours as needed for wheezing or shortness of breath. 12/08/22   Shon Hale, MD  busPIRone (BUSPAR) 5 MG tablet Take 1 tablet (5 mg total) by mouth 2 (two) times daily. 12/08/22   Shon Hale, MD  carvedilol (COREG) 3.125 MG tablet Take 1 tablet (3.125 mg total) by mouth 2 (two) times daily with a meal. 12/09/22 03/09/23  Emokpae, Courage, MD  Continuous Glucose Sensor (DEXCOM G7 SENSOR) MISC Inject 1 application  into the skin as directed. Change sensor every 10 days as directed. 12/23/22   Dani Gobble, NP  ferrous sulfate 324 MG TBEC Take 1 tablet (324 mg total) by mouth daily with breakfast. 12/08/22   Mariea Clonts, Courage, MD  FLUoxetine (PROZAC) 10 MG capsule TAKE 4 CAPSULES BY MOUTH EVERY DAY Patient taking differently: Take 10 mg by mouth See admin instructions. TAKE 2 CAPSULES IN THE MORNING AND 2 CAPSULES EVERY EVENING. 02/17/22   Paseda, Baird Kay, FNP  folic acid (FOLVITE) 1 MG tablet TAKE 1 TABLET BY MOUTH DAILY 01/10/23   Aida Raider, NP  furosemide (LASIX) 40 MG tablet Take 1 tablet (40 mg total) by mouth daily. 12/11/22 03/11/23  Shon Hale, MD  gabapentin (NEURONTIN) 100 MG capsule Take 1 tablet by mouth daily. 10/05/22   [provider]  glucose blood test strip Use as instructed to monitor glucose 4 times daily 12/09/21   Dani Gobble, NP  insulin glargine (LANTUS  SOLOSTAR) 100 UNIT/ML Solostar Pen Inject 30 Units into the skin at bedtime. 12/23/22   Dani Gobble, NP  insulin lispro (HUMALOG KWIKPEN) 100 UNIT/ML KwikPen Inject 5-11 Units into the skin 3 (three) times daily. 12/23/22   Dani Gobble, NP  Insulin Pen Needle (PEN NEEDLES) 32G X 4 MM MISC 1 each by Does not apply route in the morning, at noon, in the evening, and at bedtime. Use to inject insulin 4 times daily 05/12/22   Dani Gobble, NP  lactulose (CHRONULAC) 10 GM/15ML solution Take 15 mLs (10 g total) by mouth 2 (two) times daily. 02/21/23   Aida Raider, NP  levocetirizine (XYZAL) 5 MG tablet Take 1 tablet by mouth daily. 10/05/22   [provider]  midodrine (PROAMATINE) 5 MG tablet Take 1 tablet (5 mg total) by mouth 3 (three) times daily with meals. To help Prevent Low Blood Pressure 12/08/22   Shon Hale, MD  Multiple Vitamin (MULTIVITAMIN) tablet Take 1 tablet by mouth daily.    [provider]  ondansetron (ZOFRAN-ODT)  4 MG disintegrating tablet Take 1 tablet (4 mg total) by mouth every 8 (eight) hours as needed for nausea or vomiting. 12/21/22   Aida Raider, NP  pantoprazole (PROTONIX) 40 MG tablet Take 1 tablet (40 mg total) by mouth 2 (two) times daily. 12/08/22 06/06/23  Shon Hale, MD  spironolactone (ALDACTONE) 50 MG tablet Take 1 tablet (50 mg total) by mouth daily. 12/09/22 12/04/23  Shon Hale, MD  thiamine (VITAMIN B-1) 100 MG tablet Take 1 tablet (100 mg total) by mouth daily. 12/08/22   Shon Hale, MD  XIFAXAN 550 MG TABS tablet TAKE 1 TABLET BY MOUTH TWICE DAILY 02/14/23 02/14/24  Aida Raider, NP    Physical Exam: Vitals:   02/22/23 1045 02/22/23 1100 02/22/23 1115 02/22/23 1135  BP: 114/74 106/75 107/69   Pulse: 79 82 81 88  Resp: 19 20 19 18   Temp:      TempSrc:      SpO2:  91% 90% 97%  Weight:      Height:       General exam: Alert, awake, oriented x 3; after receiving antiemetics and analgesics was resting  comfortable and in no acute distress.  Reports feeling no nauseated currently.  1 episode of vomiting reported while in the ED demonstrating hematemesis. Respiratory system: Clear to auscultation. Respiratory effort normal.  Good saturation on room air. Cardiovascular system:RRR. No rubs or gallops; no JVD. Gastrointestinal system: Abdomen is vaguely tender to deep palpation in his upper quadrants; positive bowel sounds, no guarding, no significant distention or ascites appreciated. Central nervous system: No focal neurological deficits. Extremities: No cyanosis, clubbing or edema. Skin: No petechiae. Psychiatry: Judgement and insight appear normal. Mood & affect appropriate.   Data Reviewed: Comprehensive metabolic panel: Sodium 123, potassium 2.7, chloride 77, bicarb 32, glucose 220, BUN 18, creatinine 0.99, AST 113, ALT 38, alkaline phosphatase 222, total bilirubin 10.3 and GFR >60. CBC: White blood cell 12.7, hemoglobin 11.5 and platelet count 110K Lipase: 61 Protime-INR: 25/2.2   Assessment and Plan: * GI bleed -Patient with prior history of esophageal varices -Reporting some alcohol consumption prior to admission and experiencing nausea/vomiting with subsequent hematemesis -Upper GI bleed concerning; esophageal varices related, malory weis and gastritis as part of the differentials -Octreotide and IV PPI has been started -Patient n.p.o. -Type and screen, 2 large bore's IV requested -Follow hemoglobin trend and transfuse as needed -Hemoglobin at baseline around 13; currently 11.5. -Provide supportive care, as needed antiemetics and follow clinical response. -Patient is hemodynamically stable and in no acute distress at time of my evaluation.  ABLA (acute blood loss anemia) -Patient 2 g out in the setting of hematemesis with concern for upper GI bleed -No transfusion currently needed -Patient has been typed and screened; GI service consulted with plan for endoscopic  evaluation -Continue to follow hemoglobin trend and transfuse as needed.  Alcoholic cirrhosis of liver with ascites (HCC) -No major ascites appreciated on exam to require paracentesis at the moment -Alcohol cessation discussed with patient -Continue PPI, lactulose and rifaximin -Holding diuretics and Coreg in the setting of acute bleeding and soft blood pressure. -Oriented x 3 and without signs of acute encephalopathic process. -Continue supportive care and close monitoring.  Elevated bilirubin -Abdominal ultrasound has been ordered by GI; will follow results -Provide as needed antiemetics, fluid resuscitation and as needed analgesics -Patient was complaining of abdominal pain prior to admission. -Alcoholic hepatitis most likely playing a role.  Diabetes mellitus (HCC) -Follow CBGs fluctuation and adjust hypoglycemic regimen  as required -Patient will be n.p.o. for now will use reduce insulin therapy in comparison to home -Update A1c.  Hypokalemia -As result of GI losses, home use of diuretics and decreased oral intake -Will replete electrolytes and follow trend -Currently holding diuretics.  Alcohol abuse -Cessation counseling provided -CIWA protocol initiated -Thiamine and folic acid ordered -No acute withdrawal present on examination.  Hyperlipidemia -No actively taking statin -Heart healthy diet discussed with patient.  Thrombocytopenia/hyponatremia -Appears to be associated with chronic alcoholic cirrhosis -Fluid resuscitation will be provided -Continue to follow electrolytes -Vitamin K x 1 has been ordered as patient GI recommendations. -Avoid heparin products; continue the use of SCDs for DVT prophylaxis.    Advance Care Planning: Full code.  Consults: Gastroenterology service  Family Communication: No family at bedside.  Severity of Illness: The appropriate patient status for this patient is INPATIENT. Inpatient status is judged to be reasonable and  necessary in order to provide the required intensity of service to ensure the patient's safety. The patient's presenting symptoms, physical exam findings, and initial radiographic and laboratory data in the context of their chronic comorbidities is felt to place them at high risk for further clinical deterioration. Furthermore, it is not anticipated that the patient will be medically stable for discharge from the hospital within 2 midnights of admission.   * I certify that at the point of admission it is my clinical judgment that the patient will require inpatient hospital care spanning beyond 2 midnights from the point of admission due to high intensity of service, high risk for further deterioration and high frequency of surveillance required.*  Author: Vassie Loll, MD 02/22/2023 11:37 AM  For on call review [email or web address].

## 2023-02-22 NOTE — ED Triage Notes (Signed)
Pt arrives via POV with c/o throwing up blood that started this morning. Reports hx of esophageal varices.

## 2023-02-22 NOTE — Op Note (Signed)
[facility] Patient Name: Shawn Velazquez Procedure Date: 02/22/2023 12:30 PM MRN: [id number] Date of Birth: 07/05/1966 Attending MD: Katrinka Blazing , , [id number] CSN: [id number] Age: 57 Admit Type: Inpatient Procedure:                Upper GI endoscopy Indications:              Hematemesis Providers:                Katrinka Blazing, Angelica Ran, Elinor Parkinson,                            Dyann Ruddle Referring MD:              Medicines:                General Anesthesia Complications:            No immediate complications. Estimated Blood Loss:     Estimated blood loss: none. Procedure:                Pre-Anesthesia Assessment:                           - Prior to the procedure, a History and Physical                            was performed, and patient medications, allergies                            and sensitivities were reviewed. The patient's                            tolerance of previous anesthesia was reviewed.                           - The risks and benefits of the procedure and the                            sedation options and risks were discussed with the                            patient. All questions were answered and informed                            consent was obtained.                           - ASA Grade Assessment: IV - A patient with severe                            systemic disease that is a constant threat to life.                           After obtaining informed consent, the endoscope was                            passed under direct vision. Throughout  the                            procedure, the patient's blood pressure, pulse, and                            oxygen saturations were monitored continuously. The                            [id number]-[id number] ([id number]) scope was introduced through the                            mouth, and advanced to the second part of duodenum.                            The upper GI endoscopy was accomplished  without                            difficulty. The patient tolerated the procedure                            well. Scope In: 12:55:57 PM Scope Out: 1:06:59 PM Total Procedure Duration: 0 hours 11 minutes 2 seconds  Findings:      Three columns of grade II varices with stigmata of recent bleeding       (subtle erosions x2) were found in the lower third of the esophagus.       Scarring from prior treatment was visible. Three bands were successfully       placed with complete eradication, resulting in deflation of varices.       There was no bleeding at the end of the procedure.      Diffuse portal hypertensive gastropathy was found in the entire examined       stomach. No gastric varices were observed upon careful inspection       inforward and retroflexed view      The examined duodenum was normal. Impression:               - Grade II esophageal varices with stigmata of                            recent bleeding. Completely eradicated. Banded.                           - Portal hypertensive gastropathy. No gastric                            varices.                           - Normal examined duodenum.                           - No specimens collected. Moderate Sedation:      Per Anesthesia Care Recommendation:           - Return patient to ICU for ongoing care.                           -  NPO today. May advance to clear liquids tomorrow.                           - Ceftriaxone 2 g qday for 5 days.                           - Octreotide and PPI drip for 72 hours.                           - H/H every 12 hours, transfuse if Hb<7 g/dL.                           - Complete alcohol cessation.                           - Restart Coreg upon hospital discharge.                           - Repeat EGD in 4 weeks. Procedure Code(s):        --- Professional ---                           [id number], Esophagogastroduodenoscopy, flexible,                            transoral; with band ligation of  esophageal/gastric                            varices Diagnosis Code(s):        --- Professional ---                           I85.01, Esophageal varices with bleeding                           K76.6, Portal hypertension                           K31.89, Other diseases of stomach and duodenum                           K92.0, Hematemesis CPT copyright 2022 [employer]. All rights reserved. The codes documented in this report are preliminary and upon coder review may  be revised to meet current compliance requirements. Katrinka Blazing, MD Katrinka Blazing,  02/22/2023 1:19:01 PM This report has been signed electronically. Number of Addenda: 0

## 2023-02-22 NOTE — Assessment & Plan Note (Signed)
-  No actively taking statin -Heart healthy diet discussed with patient.

## 2023-02-22 NOTE — Assessment & Plan Note (Signed)
-  Patient with esophageal varices in the setting of alcoholic cirrhosis -Status post banding by GI service -Hemoglobin down to 8.4 from around 12.5 baseline -Blood pressure soft but is stable; patient reports no shortness of breath, chest pain or acute signs of instability. -Continue IV PPI and IV octreotide x 3 days -Continue treatment with IV antibiotics for SBP prophylaxis x 5 days -Continue to follow GI service recommendations.

## 2023-02-22 NOTE — Assessment & Plan Note (Signed)
-  No major ascites appreciated on exam to require paracentesis at the moment -Alcohol cessation discussed with patient -Continue PPI, lactulose and rifaximin -Holding diuretics and Coreg in the setting of acute bleeding and soft blood pressure. -Oriented x 3 and without signs of acute encephalopathic process. -Continue supportive care and close monitoring.

## 2023-02-22 NOTE — Assessment & Plan Note (Signed)
-  Cessation counseling provided -No acute withdrawal symptoms appreciated -Continue CIWA protocol monitoring -Continue thiamine and folic acid. -Patient DEF, INR and bilirubin trending down.  Per GI service recommendation continue to hold on prednisolone therapy.

## 2023-02-22 NOTE — Progress Notes (Signed)
Initial Nutrition Assessment  DOCUMENTATION CODES:   Not applicable  INTERVENTION:   Once diet is advanced, add:   -Boost Breeze po TID, each supplement provides 250 kcal and 9 grams of protein  -30 ml Prosource Plus BID, each supplement provides 100 kcals and 15 grams protein -MVI with minerals daily -RD will follow for diet advancement and adjust supplement regimen as appropriate  NUTRITION DIAGNOSIS:   Inadequate oral intake related to altered GI function as evidenced by NPO status.  GOAL:   Patient will meet greater than or equal to 90% of their needs  MONITOR:   PO intake, Supplement acceptance, Diet advancement  REASON FOR ASSESSMENT:   Malnutrition Screening Tool    ASSESSMENT:   Pt with medical history significant of alcoholic cirrhosis, fatty liver, gastroesophageal reflux disease, hyperlipidemia, type 2 diabetes on insulin and esophageal varices; presented to the hospital secondary to nausea/vomiting and abdominal pain.  Patient's symptoms have been present for the last 2 days and worsening.  Reports initially having episodes of vomiting with subsequent hematemesis events (more than 15 episodes at home).  Pt admitted with GIB, with history of esophageal varices.   8/20- s/p Procedure(s): ESOPHAGOGASTRODUODENOSCOPY (EGD) WITH PROPOFOL (N/A) ESOPHAGEAL BANDING  Reviewed I/O's: +851 ml x 24 hours   Pt unavailable at time of visit. Attempted to speak with pt via call to hospital room phone, however, unable to reach. RD unable to obtain further nutrition-related history or complete nutrition-focused physical exam at this time.    Per GI notes, EGD revealed grade 2 esophageal varices with stigmata of recent bleeding (completely eradicated and banded) and portal hypertensive gastropathy. Plan NPO today; plan to advance to clear liquids tomorrow (02/23/23).   Reviewed wt hx; pt has experienced a 6.8% wt loss over the past 3 months, which is not significant for time  frame. Pt is at risk for malnutrition given multiple co-morbidities and would benefit from addition of oral nutrition supplements.   Medications reviewed and include lactulose, folic acid, octreotide, and thiamine.   Lab Results  Component Value Date   HGBA1C 5.1 08/03/2022   PTA DM medications are 30 units insulin glargine daily at bedtime and 5-11 units insulin lispro TID.   Labs reviewed: Na: 123, K: 2.7, CBGS: 181 (inpatient orders for glycemic control are 0-5 units insulin aspart daily at bedtime, 0-9 units insulin aspart TID with meal,s and 10 units insulin glargine-yfgn daily).    Diet Order:   Diet Order             Diet NPO time specified  Diet effective now                   EDUCATION NEEDS:   No education needs have been identified at this time  Skin:  Skin Assessment: Reviewed RN Assessment  Last BM:  Unknown  Height:   Ht Readings from Last 1 Encounters:  02/22/23 5\' 6"  (1.676 m)    Weight:   Wt Readings from Last 1 Encounters:  02/22/23 78.4 kg    Ideal Body Weight:  59.1 kg  BMI:  Body mass index is 27.9 kg/m.  Estimated Nutritional Needs:   Kcal:  1750-1950  Protein:  90-105 grams  Fluid:  > 1.7 L    Levada Schilling, RD, LDN, CDCES Registered Dietitian II Certified Diabetes Care and Education Specialist Please refer to [facility] for RD and/or RD on-call/weekend/after hours pager

## 2023-02-22 NOTE — Plan of Care (Signed)

## 2023-02-22 NOTE — Anesthesia Preprocedure Evaluation (Signed)
Anesthesia Evaluation  Patient identified by MRN, date of birth, ID band Patient awake    Reviewed: Allergy & Precautions, H&P , NPO status , Patient's Chart, lab work & pertinent test results, reviewed documented beta blocker date and time   Airway Mallampati: II  TM Distance: >3 FB Neck ROM: full    Dental no notable dental hx.    Pulmonary neg pulmonary ROS, sleep apnea , Current Smoker and Patient abstained from smoking.   Pulmonary exam normal breath sounds clear to auscultation       Cardiovascular Exercise Tolerance: Good hypertension, negative cardio ROS  Rhythm:regular Rate:Normal     Neuro/Psych  PSYCHIATRIC DISORDERS Anxiety Depression    negative neurological ROS  negative psych ROS   GI/Hepatic negative GI ROS,GERD  ,,(+) Cirrhosis   Esophageal Varices      Endo/Other  negative endocrine ROSdiabetes    Renal/GU negative Renal ROS  negative genitourinary   Musculoskeletal   Abdominal   Peds  Hematology negative hematology ROS (+) Blood dyscrasia, anemia   Anesthesia Other Findings   Reproductive/Obstetrics negative OB ROS                             Anesthesia Physical Anesthesia Plan  ASA: 4 and emergent  Anesthesia Plan: General and General ETT   Post-op Pain Management:    Induction:   PONV Risk Score and Plan: Ondansetron  Airway Management Planned:   Additional Equipment:   Intra-op Plan:   Post-operative Plan:   Informed Consent: I have reviewed the patients History and Physical, chart, labs and discussed the procedure including the risks, benefits and alternatives for the proposed anesthesia with the patient or authorized representative who has indicated his/her understanding and acceptance.     Dental Advisory Given  Plan Discussed with: CRNA  Anesthesia Plan Comments:        Anesthesia Quick Evaluation

## 2023-02-22 NOTE — Assessment & Plan Note (Signed)
-  As result of GI losses, home use of diuretics and decreased oral intake -Will replete electrolytes and follow trend -Currently holding diuretics.

## 2023-02-22 NOTE — Assessment & Plan Note (Signed)
-  Follow CBGs fluctuation and adjust hypoglycemic regimen as required -Patient will be n.p.o. for now will use reduce insulin therapy in comparison to home -Update A1c.

## 2023-02-22 NOTE — Transfer of Care (Signed)
Immediate Anesthesia Transfer of Care Note  Patient: Shawn Velazquez  Procedure(s) Performed: ESOPHAGOGASTRODUODENOSCOPY (EGD) WITH PROPOFOL ESOPHAGEAL BANDING  Patient Location: PACU  Anesthesia Type:General  Level of Consciousness: awake  Airway & Oxygen Therapy: Patient Spontanous Breathing  Post-op Assessment: Report given to RN  Post vital signs: Reviewed and stable  Last Vitals:  Vitals Value Taken Time  BP 108/71 02/22/23 1323  Temp    Pulse 76 02/22/23 1324  Resp 15 02/22/23 1324  SpO2 96 % 02/22/23 1324  Vitals shown include unfiled device data.  Last Pain:  Vitals:   02/22/23 1247  TempSrc:   PainSc: 0-No pain         Complications: No notable events documented.

## 2023-02-22 NOTE — ED Notes (Signed)
Pt transported to [facility]

## 2023-02-22 NOTE — Progress Notes (Signed)
Ultrasound shows main portal vein thrombosis, moderate size ascites, thickening of the gallbladder wall likely secondary to chronic liver disease/hypoalbuminemia, CBD 4.5 mm.  Regarding portal vein thrombosis, patient is not a candidate for anticoagulation as he just bled from esophageal varices s/p banding earlier today.  Discussed with Dr. Levon Hedger who stated primary team can discuss with IR to see if they would recommend thrombectomy.  Suspect hyperbilirubinemia is more likely related to alcoholic hepatitis.  DF is quite elevated at 65.5 using 13 as PT control correlating with poor prognosis.  Unfortunately, patient has had compliance issues in the past with prednisolone, so he may not be a great candidate for this.  However, we will go ahead and complete workup to rule out underlying infection in the instance that prednisolone is recommended, especially if labs are worsening tomorrow.  Will check urine analysis, chest x-ray, and complete diagnostic tap.   Ermalinda Memos, PA-C [facility]

## 2023-02-22 NOTE — Brief Op Note (Addendum)
02/22/2023  1:09 PM  PATIENT:  Delsa Bern  57 y.o. male  PRE-OPERATIVE DIAGNOSIS:  hematemesis  POST-OPERATIVE DIAGNOSIS:  portal hypertensive gastropathy, esophageal erosions, esophageal varices banded x3  PROCEDURE:  Procedure(s): ESOPHAGOGASTRODUODENOSCOPY (EGD) WITH PROPOFOL (N/A) ESOPHAGEAL BANDING  SURGEON:  Surgeons and Role:    * Dolores Frame, MD - Primary  Patient underwent EGD under general anesthesia.  Tolerated the procedure adequately.   FINDINGS: - Grade II esophageal varices with stigmata of recent bleeding.  Completely eradicated.  Banded.  - Portal hypertensive gastropathy. No gastric varices. - Normal examined duodenum.  - No specimens collected.   RECOMMENDATIONS - Return patient to ICU for ongoing care.  - NPO today. May advance to clear liquids tomorrow. - Ceftriaxone 2 g qday for 5 days. - Octreotide and PPI drip for 72 hours. - H/H every 12 hours, transfuse if Hb<7 g/dL. - Complete alcohol cessation. - Restart Coreg upon hospital discharge. - Repeat EGD in 4 weeks.  Katrinka Blazing, MD Gastroenterology and Hepatology [facility]astroenter[facility]

## 2023-02-22 NOTE — Consult Note (Signed)
@LOGO @   Referring Provider: Dr. Bethann Berkshire Primary Care Physician:  [name], MD Primary Gastroenterologist:  Dr. Marletta Lor  Date of Admission: 02/22/23 Date of Consultation: 02/22/23  Reason for Consultation:  Hematemesis  HPI:  Shawn Velazquez is a 57 y.o. year old male with a history of alcoholic liver cirrhosis, esophageal varices with history of variceal bleed s/p banding (last in March 2024), alcohol abuse, alcoholic hepatitis, sleep apnea, diabetes, HTN, IDA following with hematology for IV iron, presenting to the ER today with hematemesis.    In the ER he has been hemodynamically stable.  Started on octreotide infusion and PPI infusion.  Also given IV Reglan and compazine ordered.  Labs resulted with WBC 12.7, hemoglobin 11.5 (down from 13, 3 weeks ago), platelets 110, sodium 123, potassium 2.7, AST 113, alk phos 222, total bilirubin 10.3, INR 2.2.  Consult:  Reports having intermittent nausea and vomiting starting yesterday during the day without hematemesis.  Around 4 AM this morning, he developed hematemesis and has had 10-15 episodes so far with last episode about 15 minutes before I walked in the room (9:30 AM).  Reports some intermittent epigastric/LUQ abdominal pain.  Also reports an episode of melena this morning.  Denies bright red blood or maroon-colored stool.  Denies confusion, mental status changes.  He has been feeling dizzy.    Reports he is compliant with pantoprazole twice daily.  Reports lactulose was increased to twice daily yesterday. Thinks that he is taking his carvedilol twice a day.  Last time eating/drinking: Reports he has decreased his alcohol use, drinking about 2 beers a week.  Denies any alcohol use since Sunday.  Reports lower extremity edema comes and goes.  No abdominal distention.    Prior Procedures:  EGD 08/26/2022: -grade 2 esophageal varices s/p banding x 3 with complete eradication, without bleeding -Single nonbleeding  angiodysplastic lesion in the duodenum.  -Advised Carafate suspension 1 g before meals and nightly -Repeat EGD in 4-8 weeks.  EGD 09/24/22: -4 columns of grade 2-3 esophageal varices, cherry red spots 1 on each of 2 columns without active bleeding -Portal hypertensive gastropathy with touch friability, no gastric varices -Microvasive 7 shot Bander patch -1 band placed on each column with good hemostasis -Advise repeat banding in 4 weeks. -Continue PPI  Colonoscopy 04/05/22  - Preparation of the colon was fair.  - Non- bleeding internal hemorrhoids.  - Two 2 to 3 mm polyps in the cecum, removed with a cold snare. Resected and retrieved.  - Three 4 to 8 mm polyps in the transverse colon, removed with a cold snare. Resected and retrieved.  - One 8 mm polyp in the descending colon, removed with a cold snare. Resected and retrieved.  - The examination was otherwise normal. -Pathology with tubular adenomas.  Recommended 3-year surveillance.   Past Medical History:  Diagnosis Date   Anxiety    Arthritis    affects hands, shoulder, neck, knees, hips, ankles, toes   Chronic back pain    Diabetes mellitus    diet controlled   Eczema    Esophageal varices (HCC)    Fatty liver, alcoholic    GERD (gastroesophageal reflux disease)    Glaucoma    slight case   Hypertension    Iron deficiency anemia due to chronic blood loss 11/25/2022   Psoriasis    Sleep apnea    Substance abuse (HCC)    Vertigo 09/11/2017   Wears glasses     Past Surgical History:  Procedure Laterality Date   COLONOSCOPY WITH PROPOFOL N/A 04/05/2022   Procedure: COLONOSCOPY WITH PROPOFOL;  Surgeon: Lanelle Bal, DO;  [facility]: [facility];  Service: Endoscopy;  Laterality: N/A;  10:45 AM,unable to reach pt to move up   ESOPHAGEAL BANDING N/A 08/26/2022   Procedure: ESOPHAGEAL BANDING;  Surgeon: Lanelle Bal, DO;  [facility]: [facility];  Service: Endoscopy;  Laterality: N/A;   ESOPHAGEAL BANDING   09/24/2022   Procedure: ESOPHAGEAL BANDING;  Surgeon: Corbin Ade, MD;  [facility]: [facility];  Service: Endoscopy;;   ESOPHAGOGASTRODUODENOSCOPY (EGD) WITH PROPOFOL N/A 08/26/2022   Procedure: ESOPHAGOGASTRODUODENOSCOPY (EGD) WITH PROPOFOL;  Surgeon: Lanelle Bal, DO;  [facility];  Service: Endoscopy;  Laterality: N/A;   ESOPHAGOGASTRODUODENOSCOPY (EGD) WITH PROPOFOL N/A 09/24/2022   Procedure: ESOPHAGOGASTRODUODENOSCOPY (EGD) WITH PROPOFOL;  Surgeon: Corbin Ade, MD;  [facility]: [facility];  Service: Endoscopy;  Laterality: N/A;  hematemesis, esophageal varices, melena   MULTIPLE TOOTH EXTRACTIONS     POLYPECTOMY  04/05/2022   Procedure: POLYPECTOMY;  Surgeon: Lanelle Bal, DO;  [facility]: [facility];  Service: Endoscopy;;   POSTERIOR CERVICAL LAMINECTOMY  04/21/2012   Procedure: POSTERIOR CERVICAL LAMINECTOMY;  Surgeon: Mariam Dollar, MD;  Location: [facility];  Service: Neurosurgery;  Laterality: Left;  Posterior Cervical laminectomy/foraminotomy and diskectomy, left cervical seven-thoracic one   ROTATOR CUFF REPAIR     THORACIC DISCECTOMY Left 04/03/2014   Procedure: Left Thoracic Seven to Eight, Thoracic Eight to Nine Thoracic Discectomy ;  Surgeon: Mariam Dollar, MD;  Location: [facility];  Service: Neurosurgery;  Laterality: Left;    Prior to Admission medications   Medication Sig Start Date End Date Taking? Authorizing Provider  albuterol (VENTOLIN HFA) 108 (90 Base) MCG/ACT inhaler Inhale 2 puffs into the lungs every 6 (six) hours as needed for wheezing or shortness of breath. 12/08/22   Shon Hale, MD  busPIRone (BUSPAR) 5 MG tablet Take 1 tablet (5 mg total) by mouth 2 (two) times daily. 12/08/22   Shon Hale, MD  carvedilol (COREG) 3.125 MG tablet Take 1 tablet (3.125 mg total) by mouth 2 (two) times daily with a meal. 12/09/22 03/09/23  Emokpae, Courage, MD  Continuous Glucose Sensor (DEXCOM G7 SENSOR) MISC Inject 1 application  into the skin as  directed. Change sensor every 10 days as directed. 12/23/22   Dani Gobble, NP  ferrous sulfate 324 MG TBEC Take 1 tablet (324 mg total) by mouth daily with breakfast. 12/08/22   Mariea Clonts, Courage, MD  FLUoxetine (PROZAC) 10 MG capsule TAKE 4 CAPSULES BY MOUTH EVERY DAY Patient taking differently: Take 10 mg by mouth See admin instructions. TAKE 2 CAPSULES IN THE MORNING AND 2 CAPSULES EVERY EVENING. 02/17/22   Paseda, Baird Kay, FNP  folic acid (FOLVITE) 1 MG tablet TAKE 1 TABLET BY MOUTH DAILY 01/10/23   Aida Raider, NP  furosemide (LASIX) 40 MG tablet Take 1 tablet (40 mg total) by mouth daily. 12/11/22 03/11/23  Shon Hale, MD  gabapentin (NEURONTIN) 100 MG capsule Take 1 tablet by mouth daily. 10/05/22   [provider]  glucose blood test strip Use as instructed to monitor glucose 4 times daily 12/09/21   Dani Gobble, NP  insulin glargine (LANTUS SOLOSTAR) 100 UNIT/ML Solostar Pen Inject 30 Units into the skin at bedtime. 12/23/22   Dani Gobble, NP  insulin lispro (HUMALOG KWIKPEN) 100 UNIT/ML KwikPen Inject 5-11 Units into the skin 3 (three) times  daily. 12/23/22   Dani Gobble, NP  Insulin Pen Needle (PEN NEEDLES) 32G X 4 MM MISC 1 each by Does not apply route in the morning, at noon, in the evening, and at bedtime. Use to inject insulin 4 times daily 05/12/22   Dani Gobble, NP  lactulose (CHRONULAC) 10 GM/15ML solution Take 15 mLs (10 g total) by mouth 2 (two) times daily. 02/21/23   Aida Raider, NP  levocetirizine (XYZAL) 5 MG tablet Take 1 tablet by mouth daily. 10/05/22   [provider]  midodrine (PROAMATINE) 5 MG tablet Take 1 tablet (5 mg total) by mouth 3 (three) times daily with meals. To help Prevent Low Blood Pressure 12/08/22   Shon Hale, MD  Multiple Vitamin (MULTIVITAMIN) tablet Take 1 tablet by mouth daily.    [provider]  ondansetron (ZOFRAN-ODT) 4 MG disintegrating tablet Take 1 tablet (4 mg total) by mouth  every 8 (eight) hours as needed for nausea or vomiting. 12/21/22   Aida Raider, NP  pantoprazole (PROTONIX) 40 MG tablet Take 1 tablet (40 mg total) by mouth 2 (two) times daily. 12/08/22 06/06/23  Shon Hale, MD  spironolactone (ALDACTONE) 50 MG tablet Take 1 tablet (50 mg total) by mouth daily. 12/09/22 12/04/23  Shon Hale, MD  thiamine (VITAMIN B-1) 100 MG tablet Take 1 tablet (100 mg total) by mouth daily. 12/08/22   Shon Hale, MD  XIFAXAN 550 MG TABS tablet TAKE 1 TABLET BY MOUTH TWICE DAILY 02/14/23 02/14/24  Aida Raider, NP    Current Facility-Administered Medications  Medication Dose Route Frequency Provider Last Rate Last Admin   metoCLOPramide (REGLAN) injection 10 mg  10 mg Intravenous Once Marguerita Merles, Reuel Boom, MD       octreotide (SANDOSTATIN) 500 mcg in sodium chloride 0.9 % 250 mL (2 mcg/mL) infusion  50 mcg/hr Intravenous Continuous Bethann Berkshire, MD 25 mL/hr at 02/22/23 0927 50 mcg/hr at 02/22/23 0927   [START ON 02/25/2023] pantoprazole (PROTONIX) injection 40 mg  40 mg Intravenous Q12H Bethann Berkshire, MD       pantoprozole (PROTONIX) 80 mg /NS 100 mL infusion  8 mg/hr Intravenous Continuous Bethann Berkshire, MD       potassium chloride 10 mEq in 100 mL IVPB  10 mEq Intravenous Once Bethann Berkshire, MD       potassium chloride 10 mEq in 100 mL IVPB  10 mEq Intravenous Once Bethann Berkshire, MD       prochlorperazine (COMPAZINE) injection 10 mg  10 mg Intravenous Once Bethann Berkshire, MD       sodium chloride 0.9 % bolus 1,000 mL  1,000 mL Intravenous Once Bethann Berkshire, MD       Current Outpatient Medications  Medication Sig Dispense Refill   albuterol (VENTOLIN HFA) 108 (90 Base) MCG/ACT inhaler Inhale 2 puffs into the lungs every 6 (six) hours as needed for wheezing or shortness of breath. 18 g 0   busPIRone (BUSPAR) 5 MG tablet Take 1 tablet (5 mg total) by mouth 2 (two) times daily. 60 tablet 2   carvedilol (COREG) 3.125 MG tablet Take 1 tablet (3.125  mg total) by mouth 2 (two) times daily with a meal. 60 tablet 2   Continuous Glucose Sensor (DEXCOM G7 SENSOR) MISC Inject 1 application  into the skin as directed. Change sensor every 10 days as directed. 9 each 3   ferrous sulfate 324 MG TBEC Take 1 tablet (324 mg total) by mouth daily with breakfast. 30 tablet 3  FLUoxetine (PROZAC) 10 MG capsule TAKE 4 CAPSULES BY MOUTH EVERY DAY (Patient taking differently: Take 10 mg by mouth See admin instructions. TAKE 2 CAPSULES IN THE MORNING AND 2 CAPSULES EVERY EVENING.) 260 capsule 0   folic acid (FOLVITE) 1 MG tablet TAKE 1 TABLET BY MOUTH DAILY 30 tablet 5   furosemide (LASIX) 40 MG tablet Take 1 tablet (40 mg total) by mouth daily. 30 tablet 2   gabapentin (NEURONTIN) 100 MG capsule Take 1 tablet by mouth daily.     glucose blood test strip Use as instructed to monitor glucose 4 times daily 100 each 12   insulin glargine (LANTUS SOLOSTAR) 100 UNIT/ML Solostar Pen Inject 30 Units into the skin at bedtime. 30 mL 3   insulin lispro (HUMALOG KWIKPEN) 100 UNIT/ML KwikPen Inject 5-11 Units into the skin 3 (three) times daily. 28 mL 3   Insulin Pen Needle (PEN NEEDLES) 32G X 4 MM MISC 1 each by Does not apply route in the morning, at noon, in the evening, and at bedtime. Use to inject insulin 4 times daily 200 each 3   lactulose (CHRONULAC) 10 GM/15ML solution Take 15 mLs (10 g total) by mouth 2 (two) times daily. 946 mL 4   levocetirizine (XYZAL) 5 MG tablet Take 1 tablet by mouth daily.     midodrine (PROAMATINE) 5 MG tablet Take 1 tablet (5 mg total) by mouth 3 (three) times daily with meals. To help Prevent Low Blood Pressure 90 tablet 3   Multiple Vitamin (MULTIVITAMIN) tablet Take 1 tablet by mouth daily.     ondansetron (ZOFRAN-ODT) 4 MG disintegrating tablet Take 1 tablet (4 mg total) by mouth every 8 (eight) hours as needed for nausea or vomiting. 30 tablet 1   pantoprazole (PROTONIX) 40 MG tablet Take 1 tablet (40 mg total) by mouth 2 (two) times  daily. 60 tablet 5   spironolactone (ALDACTONE) 50 MG tablet Take 1 tablet (50 mg total) by mouth daily. 90 tablet 3   thiamine (VITAMIN B-1) 100 MG tablet Take 1 tablet (100 mg total) by mouth daily. 30 tablet 3   XIFAXAN 550 MG TABS tablet TAKE 1 TABLET BY MOUTH TWICE DAILY 60 tablet 11    Allergies as of 02/22/2023 - Review Complete 02/22/2023  Allergen Reaction Noted   Neosporin + pain relief max st [neomy-bacit-polymyx-pramoxine] Rash 04/19/2012    Family History  Problem Relation Age of Onset   Diabetes Father    Cancer - Lung Father    Hypertension Other    Cancer - Lung Other     Social History   Socioeconomic History   Marital status: Divorced    Spouse name: Not on file   Number of children: Not on file   Years of education: Not on file   Highest education level: Not on file  Occupational History    Comment: Truck to storage  Tobacco Use   Smoking status: Every Day    Current packs/day: 0.00    Average packs/day: 0.3 packs/day for 40.0 years (10.0 ttl pk-yrs)    Types: Cigarettes    Start date: 05/09/1982    Last attempt to quit: 05/09/2022    Years since quitting: 0.7   Smokeless tobacco: Never   Tobacco comments:    pt trying to quit on his own  Vaping Use   Vaping status: Former  Substance and Sexual Activity   Alcohol use: Not Currently    Alcohol/week: 12.0 standard drinks of alcohol    Types:  12 Cans of beer per week    Comment: last drink 2 beers on Sunday 02/20/2023   Drug use: No   Sexual activity: Yes    Birth control/protection: None  Other Topics Concern   Not on file  Social History Narrative   Not on file   Social Determinants of Health   Financial Resource Strain: Medium Risk (10/07/2022)   Overall Financial Resource Strain (CARDIA)    Difficulty of Paying Living Expenses: Somewhat hard  Food Insecurity: Low Risk  (01/18/2023)   Received from [facility]   Food vital sign    Within the past 12 months, you worried that your food would  run out before you got money to buy more: Never true    Within the past 12 months, the food you bought just didn't last and you didn't have money to get more. : Never true  Transportation Needs: Not on file (01/18/2023)  Physical Activity: Not on file  Stress: Stress Concern Present (10/07/2022)   [employer] of Occupational Health - Occupational Stress Questionnaire    Feeling of Stress : To some extent  Social Connections: Not on file  Intimate Partner Violence: Not At Risk (12/08/2022)   Humiliation, Afraid, Rape, and Kick questionnaire    Fear of Current or Ex-Partner: No    Emotionally Abused: No    Physically Abused: No    Sexually Abused: No    Review of Systems: Gen: Denies fever, chills, cold or flu like symptoms.  CV: Denies chest pain, heart palpitations. Resp: Admits to mild SOB. No cough.  GI: See HPI GU : Denies urinary burning, urinary frequency, urinary incontinence.  MS: Denies joint pain. Derm: Denies rash. Psych: Denies depression, anxiety. Heme: See HPI  Physical Exam: Vital signs in last 24 hours: Temp:  [97.8 F (36.6 C)] 97.8 F (36.6 C) (08/20 0858) Pulse Rate:  [85-96] 96 (08/20 0845) Resp:  [19-25] 25 (08/20 0845) BP: (102-147)/(70-73) 147/73 (08/20 0845) SpO2:  [94 %-99 %] 94 % (08/20 0845) Weight:  [78.5 kg] 78.5 kg (08/20 0818)   General:   Alert,  well-developed, well-nourished, pleasant and cooperative in NAD, jaundice, holding vomit bag with bright red blood in the bag.  Head:  Normocephalic and atraumatic. Eyes:  + scleral icterus.    Ears:  Normal auditory acuity. Lungs:  Clear throughout to auscultation.   No wheezes, crackles, or rhonchi. No acute distress. Heart:  Regular rate and rhythm; no murmurs, clicks, rubs,  or gallops. Abdomen:  Soft, and nondistended. Mild TTP in epigastric and LUQ region.  Soft, reducible umbilical hernia.  Hypoactive bowel sounds.  No guarding or rebound.  Rectal:  Deferred  Msk:  Symmetrical without gross  deformities. Normal posture. Extremities:  With 2+ pitting edema at the ankles, 1+ pitting edema extending up to the knees. Neurologic:  Alert and  oriented x4;  grossly normal neurologically. Skin:  Intact without significant lesions or rashes. Psych: Normal mood and affect.  Intake/Output from previous day: No intake/output data recorded. Intake/Output this shift: No intake/output data recorded.  Lab Results: Recent Labs    02/22/23 0904 02/22/23 0905  WBC  --  12.7*  HGB 11.6* 11.5*  HCT 34.0* 32.1*  PLT  --  110*   BMET Recent Labs    02/22/23 0904 02/22/23 0905  NA 125* 123*  K 2.8* 2.7*  CL 78* 77*  CO2  --  32  GLUCOSE 217* 220*  BUN 16 18  CREATININE 1.10 0.99  CALCIUM  --  8.4*   LFT Recent Labs    02/22/23 0905  PROT 6.5  ALBUMIN 2.6*  AST 113*  ALT 38  ALKPHOS 222*  BILITOT 10.3*   PT/INR Recent Labs    02/22/23 0905  LABPROT 25.0*  INR 2.2*    Impression: 57 y.o. year old male with a history of alcoholic liver cirrhosis, esophageal varices with history of variceal bleed s/p banding (last in March 2024), alcohol abuse, alcoholic hepatitis, sleep apnea, diabetes, HTN, IDA following with hematology for IV iron, presenting to the ER today with acute onset hematemesis this morning with multiple episodes and single episode of melena.  Found have hemoglobin of 11.5 (9 from 13.1, 3 weeks ago), potassium 2.7, sodium 123, AST 113, alk phos 222, total bilirubin 10.3, INR 2.2.   Hematemesis/melena: Acute onset around 4 AM this morning with a total of 10-15 episodes of hematemesis.  Single episode of melena earlier this morning.  Hemoglobin has declined to 11.5 from 13.1, 3 weeks ago.  INR is elevated at 2.2 and platelets are low at 110.  Symptoms are concerning for variceal bleeding.  History of esophageal variceal bleed with last episode in March undergoing EGD which showed 4 columns of grade 2-3 esophageal varices with cherry red spots s/p banding, portal  hypertensive gastropathy with touch friability.  He was to have repeat EGD in 4 weeks, but this was never completed.  He does report compliance with PPI twice daily and carvedilol 3.125 mg twice daily but has unfortunately continued to drink alcohol. Unclear if he is being forthcoming with the amount of alcohol that he is drinking.  He needs EGD urgently once medically stable. Na of 123 and K of 2.7 will need to be improved.   Hyperbilirubinemia/elevated LFTs: Suspect this is likely secondary to alcoholic hepatitis though patient is reporting that he is only drinking a couple of beers per week with last alcohol use on Sunday.  Unclear if patient is being forthcoming with the amount of alcohol that he is consuming.  DF today is 65.5 using 13 PT control correlating with poor prognosis.  As he is also reporting some upper abdominal pain as well as vomiting that started yesterday, will obtain RUQ ultrasound to rule out biliary etiology as well as lipase to evaluate for pancreatitis.  Holding off on starting steroids for now while evaluating for other etiology and also in light of mild leukocytosis though this could be reactive in the setting of GI bleeding.  Additionally, compliance with prednisolone is of concern as patient was previously treated in January 2024 for alcoholic hepatitis and was noncompliant with follow-up.   EtOH Cirrhosis: Decompensated with history of ascites, coagulopathy, esophageal varices with bleeding with last episode in March 2024 s/p banding.  MELD 3.0 is 29 today.Currently with hematemesis, concerning for variceal bleed as discussed above.  He does report compliance with carvedilol 3.125 mg twice daily at home.  He is currently on octreotide and PPI infusion.  Will start Rocephin 1 g daily for SBP prophylaxis.    He also has 1-2+ lower extremity pitting edema, reporting compliance with Lasix and spironolactone outpatient, but diuretics 20 to be held for now due to severe  electrolyte abnormalities with sodium 123, potassium 2.7. He does not have any evidence of hepatic encephalopathy at this time though we will need to monitor this closely in light of GI bleeding.   Hyponatremia/hypokalemia; Sodium 123 and potassium 2.7.  Likely multifactorial in setting of cirrhosis, diuretics, GI losses.  Management per  hospitalist.  Diuretics will need to be held until electrolytes are corrected.    Plan: Trend H/H every 4 hours. Transfuse as needed.  EGD as soon as medically stable.  Keep NPO Continue Octreotide infusion.  Continue PPI infusion.  Give IV Vitamin K 10 mg x1 Start Rocephin 1g daily for SBP prophylaxis. Continue compazine for nausea. Will schedule q6 hours for now.  RUQ [place] Lipase UA Hold diuretics until electrolytes have been corrected.  Management of hyponatremia and hypokalemia per hospitalist. Daily MELD labs.    LOS: 0 days    02/22/2023, 10:23 AM   Ermalinda Memos, PA-C [facility]

## 2023-02-22 NOTE — Anesthesia Procedure Notes (Signed)
Procedure Name: Intubation Date/Time: 02/22/2023 12:52 PM  Performed by: Moshe Salisbury, CRNAPre-anesthesia Checklist: Patient identified, Patient being monitored, Timeout performed, Emergency Drugs available and Suction available Patient Re-evaluated:Patient Re-evaluated prior to induction Oxygen Delivery Method: Circle system utilized Preoxygenation: Pre-oxygenation with 100% oxygen Induction Type: IV induction, Rapid sequence and Cricoid Pressure applied Ventilation: Mask ventilation without difficulty Laryngoscope Size: Mac and 3 Grade View: Grade I Tube type: Oral Tube size: 7.5 mm Number of attempts: 1 Airway Equipment and Method: Stylet Placement Confirmation: ETT inserted through vocal cords under direct vision, positive ETCO2 and breath sounds checked- equal and bilateral Secured at: 22 cm Tube secured with: Tape Dental Injury: Teeth and Oropharynx as per pre-operative assessment

## 2023-02-22 NOTE — Assessment & Plan Note (Signed)
-  Patient with prior history of esophageal varices -Reporting some alcohol consumption prior to admission and experiencing nausea/vomiting with subsequent hematemesis -Upper GI bleed concerning; esophageal varices related, malory weis and gastritis as part of the differentials -Octreotide and IV PPI has been started -Patient n.p.o. -Type and screen, 2 large bore's IV requested -Follow hemoglobin trend and transfuse as needed -Hemoglobin at baseline around 13; currently 11.5. -Provide supportive care, as needed antiemetics and follow clinical response. -Patient is hemodynamically stable and in no acute distress at time of my evaluation.

## 2023-02-22 NOTE — Assessment & Plan Note (Addendum)
-  Abdominal ultrasound has been ordered by GI demonstrating portal vein thrombosis and cirrhotic changes.  There is no cholelithiasis or seen obstruction.  -No signs of cholecystitis on ultrasound.  Positive gallbladder sludge. -Patient's DF trending down and for now holding on prednisolone therapy. -CT angiogram BRTO demonstrated no portal vein thrombosis. -Bilirubin level trending down; 8.5 currently.

## 2023-02-23 ENCOUNTER — Inpatient Hospital Stay (HOSPITAL_COMMUNITY): Payer: BC Managed Care – PPO

## 2023-02-23 ENCOUNTER — Encounter (HOSPITAL_COMMUNITY): Payer: Self-pay | Admitting: Internal Medicine

## 2023-02-23 ENCOUNTER — Other Ambulatory Visit (HOSPITAL_COMMUNITY): Payer: Self-pay | Admitting: *Deleted

## 2023-02-23 DIAGNOSIS — I8511 Secondary esophageal varices with bleeding: Secondary | ICD-10-CM

## 2023-02-23 DIAGNOSIS — I10 Essential (primary) hypertension: Secondary | ICD-10-CM | POA: Diagnosis not present

## 2023-02-23 DIAGNOSIS — K920 Hematemesis: Secondary | ICD-10-CM | POA: Diagnosis not present

## 2023-02-23 DIAGNOSIS — R17 Unspecified jaundice: Secondary | ICD-10-CM

## 2023-02-23 DIAGNOSIS — Z9689 Presence of other specified functional implants: Secondary | ICD-10-CM | POA: Diagnosis not present

## 2023-02-23 DIAGNOSIS — I81 Portal vein thrombosis: Secondary | ICD-10-CM | POA: Diagnosis present

## 2023-02-23 DIAGNOSIS — D62 Acute posthemorrhagic anemia: Secondary | ICD-10-CM | POA: Diagnosis not present

## 2023-02-23 LAB — CBC
HCT: 22.9 % — ABNORMAL LOW (ref 39.0–52.0)
Hemoglobin: 8.5 g/dL — ABNORMAL LOW (ref 13.0–17.0)
MCH: 41.1 pg — ABNORMAL HIGH (ref 26.0–34.0)
MCHC: 37.1 g/dL — ABNORMAL HIGH (ref 30.0–36.0)
MCV: 110.6 fL — ABNORMAL HIGH (ref 80.0–100.0)
Platelets: 65 10*3/uL — ABNORMAL LOW (ref 150–400)
RBC: 2.07 MIL/uL — ABNORMAL LOW (ref 4.22–5.81)
RDW: 16 % — ABNORMAL HIGH (ref 11.5–15.5)
WBC: 4.5 10*3/uL (ref 4.0–10.5)
nRBC: 0 % (ref 0.0–0.2)

## 2023-02-23 LAB — BODY FLUID CELL COUNT WITH DIFFERENTIAL
Eos, Fluid: 0 %
Lymphs, Fluid: 69 %
Monocyte-Macrophage-Serous Fluid: 30 % — ABNORMAL LOW (ref 50–90)
Neutrophil Count, Fluid: 1 % (ref 0–25)
Total Nucleated Cell Count, Fluid: 225 cu mm (ref 0–1000)

## 2023-02-23 LAB — GRAM STAIN: Gram Stain: NONE SEEN

## 2023-02-23 LAB — COMPREHENSIVE METABOLIC PANEL
ALT: 38 U/L (ref 0–44)
ALT: 31 U/L (ref 0–44)
AST: 113 U/L — ABNORMAL HIGH (ref 15–41)
AST: 98 U/L — ABNORMAL HIGH (ref 15–41)
Albumin: 2.6 g/dL — ABNORMAL LOW (ref 3.5–5.0)
Albumin: 2.4 g/dL — ABNORMAL LOW (ref 3.5–5.0)
Alkaline Phosphatase: 222 U/L — ABNORMAL HIGH (ref 38–126)
Alkaline Phosphatase: 127 U/L — ABNORMAL HIGH (ref 38–126)
Anion gap: 14 (ref 5–15)
Anion gap: 12 (ref 5–15)
BUN: 18 mg/dL (ref 6–20)
BUN: 17 mg/dL (ref 6–20)
CO2: 32 mmol/L (ref 22–32)
CO2: 28 mmol/L (ref 22–32)
Calcium: 8.4 mg/dL — ABNORMAL LOW (ref 8.9–10.3)
Calcium: 7.7 mg/dL — ABNORMAL LOW (ref 8.9–10.3)
Chloride: 77 mmol/L — ABNORMAL LOW (ref 98–111)
Chloride: 89 mmol/L — ABNORMAL LOW (ref 98–111)
Creatinine, Ser: 0.99 mg/dL (ref 0.61–1.24)
Creatinine, Ser: 0.82 mg/dL (ref 0.61–1.24)
GFR, Estimated: >60 mL/min (ref >60)
GFR, Estimated: >60 mL/min (ref >60)
Glucose, Bld: 220 mg/dL — ABNORMAL HIGH (ref 70–99)
Glucose, Bld: 292 mg/dL — ABNORMAL HIGH (ref 70–99)
Potassium: 2.7 mmol/L — CL (ref 3.5–5.1)
Potassium: 2.9 mmol/L — ABNORMAL LOW (ref 3.5–5.1)
Sodium: 123 mmol/L — ABNORMAL LOW (ref 135–145)
Sodium: 129 mmol/L — ABNORMAL LOW (ref 135–145)
Total Bilirubin: 10.3 mg/dL — ABNORMAL HIGH (ref 0.3–1.2)
Total Bilirubin: 11.6 mg/dL — ABNORMAL HIGH (ref 0.3–1.2)
Total Protein: 6.5 g/dL (ref 6.5–8.1)
Total Protein: 5.2 g/dL — ABNORMAL LOW (ref 6.5–8.1)

## 2023-02-23 LAB — ECHOCARDIOGRAM COMPLETE
Area-P 1/2: 3.48 cm2
Height: 66 in
S' Lateral: 2.5 cm
Weight: 2765.45 oz

## 2023-02-23 LAB — GLUCOSE, CAPILLARY
Glucose-Capillary: 77 mg/dL (ref 70–99)
Glucose-Capillary: 176 mg/dL — ABNORMAL HIGH (ref 70–99)
Glucose-Capillary: 205 mg/dL — ABNORMAL HIGH (ref 70–99)
Glucose-Capillary: 244 mg/dL — ABNORMAL HIGH (ref 70–99)

## 2023-02-23 LAB — HEMOGLOBIN AND HEMATOCRIT, BLOOD
HCT: 24.4 % — ABNORMAL LOW (ref 39.0–52.0)
HCT: 23.5 % — ABNORMAL LOW (ref 39.0–52.0)
HCT: 23.9 % — ABNORMAL LOW (ref 39.0–52.0)
Hemoglobin: 8.8 g/dL — ABNORMAL LOW (ref 13.0–17.0)
Hemoglobin: 8.4 g/dL — ABNORMAL LOW (ref 13.0–17.0)
Hemoglobin: 8.3 g/dL — ABNORMAL LOW (ref 13.0–17.0)

## 2023-02-23 LAB — ALBUMIN, PLEURAL OR PERITONEAL FLUID: Albumin, Fluid: <1.5 g/dL

## 2023-02-23 LAB — AMMONIA: Ammonia: 57 umol/L — ABNORMAL HIGH (ref 9–35)

## 2023-02-23 LAB — PROTIME-INR
INR: 2.3 — ABNORMAL HIGH (ref 0.8–1.2)
Prothrombin Time: 25.4 seconds — ABNORMAL HIGH (ref 11.4–15.2)

## 2023-02-23 MED ORDER — ALBUMIN HUMAN 25 % IV SOLN
25.0000 g | Freq: Once | INTRAVENOUS | Status: AC
  Administered 2023-02-23: 25 g via INTRAVENOUS

## 2023-02-23 MED ORDER — VITAMIN K1 10 MG/ML IJ SOLN
10.0000 mg | Freq: Once | INTRAVENOUS | Status: AC
  Administered 2023-02-23: 10 mg via INTRAVENOUS
  Filled 2023-02-23: qty 1

## 2023-02-23 MED ORDER — IOHEXOL 350 MG/ML SOLN
100.0000 mL | Freq: Once | INTRAVENOUS | Status: AC | PRN
  Administered 2023-02-23: 100 mL via INTRAVENOUS

## 2023-02-23 MED ORDER — IOHEXOL 9 MG/ML PO SOLN
ORAL | Status: AC
  Filled 2023-02-23: qty 500

## 2023-02-23 MED ORDER — ALBUMIN HUMAN 25 % IV SOLN
INTRAVENOUS | Status: AC
  Filled 2023-02-23: qty 100

## 2023-02-23 MED ORDER — SODIUM CHLORIDE 0.9 % IV BOLUS
500.0000 mL | Freq: Once | INTRAVENOUS | Status: AC
  Administered 2023-02-23: 500 mL via INTRAVENOUS

## 2023-02-23 MED FILL — Folic Acid Inj 5 MG/ML: INTRAMUSCULAR | Qty: 0.2 | Status: AC

## 2023-02-23 NOTE — Procedures (Signed)
PROCEDURE SUMMARY:  Successful image-guided paracentesis from the right lateral abdomen.  Yielded 3.3 liters of clear yellow fluid.  No immediate complications.  EBL: zero Patient tolerated well.   Specimen was sent for labs.  Please see imaging section of Epic for full dictation.  Villa Herb PA-C 02/23/2023 10:10 AM

## 2023-02-23 NOTE — Progress Notes (Signed)
Placed patient on 2L oxygen. Oxygen sats dropped to 85% while he was asleep.

## 2023-02-23 NOTE — Progress Notes (Signed)
Pt returned from procedure. DDI to right lateral abd. Pt with no c/o pain or SOB.

## 2023-02-23 NOTE — Progress Notes (Signed)
   02/23/23 0452  Assess: MEWS Score  Temp 98.1 F (36.7 C)  BP (!) 80/50  Pulse Rate 78  SpO2 90 %  O2 Device Room Air  Assess: MEWS Score  MEWS Temp 0  MEWS Systolic 2  MEWS Pulse 0  MEWS RR 0  MEWS LOC 0  MEWS Score 2  MEWS Score Color Yellow  Assess: if the MEWS score is Yellow or Red  Were vital signs accurate and taken at a resting state? Yes  Does the patient meet 2 or more of the SIRS criteria? No  MEWS guidelines implemented  Yes, yellow  Treat  MEWS Interventions Considered administering scheduled or prn medications/treatments as ordered  Take Vital Signs  Increase Vital Sign Frequency  Yellow: Q2hr x1, continue Q4hrs until patient remains green for 12hrs  Escalate  MEWS: Escalate Yellow: Discuss with charge nurse and consider notifying provider and/or RRT  Notify: Charge Nurse/RN  Name of Charge Nurse/RN Notified Edgar Frisk, RN  Provider Notification  Provider Name/Title Dr. Carren Rang  Date Provider Notified 02/23/23  Time Provider Notified [phone]  Method of Notification Face-to-face  Provider response See new orders  Assess: SIRS CRITERIA  SIRS Temperature  0  SIRS Pulse 0  SIRS Respirations  0  SIRS WBC 0  SIRS Score Sum  0

## 2023-02-23 NOTE — Progress Notes (Signed)
Progress Note   Patient: Shawn Velazquez:[id number] DOB: 10-Jan-1966 DOA: 02/22/2023     1 DOS: the patient was seen and examined on 02/23/2023   Brief hospital admission narrative: Shawn Velazquez is a 57 y.o. male with medical history significant of alcoholic cirrhosis, fatty liver, gastroesophageal reflux disease, hyperlipidemia, type 2 diabetes on insulin and esophageal varices; presented to the hospital secondary to nausea/vomiting and abdominal pain.  Patient's symptoms have been present for the last 2 days and worsening.  Reports initially having episodes of vomiting with subsequent hematemesis events (more than 15 episodes at home).   Patient reports noticing 1 episode of melena on the day of admission.   Patient reports no chest pain, no dysuria/hematuria, focal weaknesses, sick contacts or any other complaints.   Workup in the ED demonstrating decrease of patient's hemoglobin level from baseline by 2 g; male 3.0 score 29 at time of admission.  Elevated bilirubin levels and complaints of feeling dizzy/slightly lightheaded.  GI service consulted with plan for endoscopic evaluation and TR[facility] has been called to place patient in the hospital for further evaluation and management.  Assessment and Plan: * GI bleed -In the setting of esophageal varices bleed due to alcoholic cirrhosis with history of ongoing alcohol use. -Status post endoscopic evaluation and ligation/banding. -Continue IV octreotide and PPI -Will continue to follow hemoglobin trend and transfuse as needed for level less than 7. -Blood pressure is soft but stable; patient in no acute distress. -Continue to follow GI service recommendation.  ABLA (acute blood loss anemia) -Patient with esophageal varices in the setting of alcoholic cirrhosis -Status post banding by GI service -Hemoglobin down to 8.4 from around 12.5 baseline -Blood pressure soft but is stable; patient reports no shortness of breath, chest pain or  acute signs of instability. -Continue IV PPI and IV octreotide x 3 days -Continue treatment with IV antibiotics for SBP prophylaxis x 5 days -Continue to follow GI service recommendations.  Alcoholic cirrhosis of liver with ascites (HCC) -No major ascites appreciated on exam  -Alcohol cessation discussed with patient -Continue PPI, lactulose and rifaximin -Holding diuretics and Coreg in the setting of acute bleeding and soft blood pressure. -Continue lactulose with intention to achieve 2-3 bowel movements daily. -Ammonia level 57. -MELD 3.0 score 29. -Evaluation for potential TIPS down the road initiated following recommendations by IR.  Portal vein thrombosis -Following GI service recommendation IR service has been consulted for potential thrombectomy -From esophageal varices. -Per IR will check CTA BRTO and Echo -Continue supportive care. -Follow hemoglobin trend.  Elevated bilirubin -Abdominal ultrasound has been ordered by GI demonstrating portal vein thrombosis and cirrhotic changes.  There is no cholelithiasis or seen obstruction.  No signs of cholecystitis on ultrasound.  Positive gallbladder sludge. -Patient's DF 68.6 suggesting presence of early hepatitis -Will follow GI service recommendation regarding decision for prednisolone initiation.   -Continue to maintain adequate hydration and follow response.  Diabetes mellitus (HCC) -A1c 6.7 -Continue to follow CBG fluctuation and further adjust hypoglycemic regimen. -Continue sliding scale insulin   Hypokalemia -As result of GI losses, home use of diuretics and decreased oral intake -Continue to follow electrolytes trend and further replete as needed. -Potassium 2.9 currently.  Alcohol abuse -Cessation counseling provided -No acute withdrawal symptoms appreciated -Continue CIWA protocol monitoring -Continue thiamine and folic acid. -DF elevated; GI service considering initiation of treatment with prednisolone for  alcoholic hepatitis. -Continue supportive care and follow GI service recommendations.  Hyperlipidemia -No actively taking statin -Heart healthy  diet discussed with patient.  Hyponatremia: -Chronic in the setting of cirrhosis -Continue to closely follow electrolyte -Continue to hold diuretics for now.  Subjective:  No acute withdrawal, no chest pain, no nausea or vomiting reported.  Expressed some dark stools overnight.  Patient is afebrile.  Feeling better.  Physical Exam: Vitals:   02/23/23 1010 02/23/23 1022 02/23/23 1028 02/23/23 1356  BP: (!) 99/56 (!) 97/52 (!) 95/54 95/62  Pulse: 70 69 71 80  Resp: 18 18 16 17   Temp:    98.4 F (36.9 C)  TempSrc:      SpO2: 95% 93% 95% 97%  Weight:      Height:       General exam: Alert, awake, oriented x 3; no confusion or acute withdrawal appreciated.  Reports no further episodes of nausea/vomiting.  Patient noticed some dark stools overnight. Respiratory system: Clear to auscultation. Respiratory effort normal.  Good saturation on room air.  No using accessory muscles. Cardiovascular system:RRR. No rubs or gallops; no JVD. Gastrointestinal system: Abdomen is slightly distended but not tense; positive bowel sounds, reports no abdominal pain. Central nervous system: Alert and oriented. No focal neurological deficits. Extremities: No cyanosis or clubbing. Skin: No petechiae. Psychiatry: Judgement and insight appear normal. Mood & affect appropriate.    Data Reviewed: Male with a 3.0 score: 29 Ammonia level 57 INR: 2.3 Hemoglobin/hematocrit: 8.4/23.5  Family Communication: No family at bedside.  Disposition: Status is: Inpatient Remains inpatient appropriate because: Continue IV octreotide and IV PPI.  Continue to follow hemoglobin trend and transfuse for hemoglobin less than 7 as needed.   Planned Discharge Destination: Home   Time spent: 50 minutes  Author: Vassie Loll, MD 02/23/2023 6:01 PM  For on call review  [email or web address].

## 2023-02-23 NOTE — Consult Note (Cosign Needed Addendum)
Chief Complaint: Patient was seen in consultation today for esophageal varices, portal vein thrombosis.  Referring Physician(s): Vassie Loll, MD  Supervising Physician: Roanna Banning  Patient Status: [facility] inpt  History of Present Illness: Shawn Velazquez is a 57 y.o. male with a past medical history significant for anxiety, OSA, GERD, anemia requiring periodic IV iron infusions, HTN, DM, ETOH abuse, ETOH hepatitis, ETOH cirrhosis and esophageal varices  s/p banding March 2024 who presented to [facility] ED on 02/22/23 with complaints of hematemesis. In the ED he was noted to be hypotensive but otherwise stable and was started on octreotide and PPI infusions. Initial labs noted hgb 11.5 (previously 13 about 3 weeks prior), plt 110, Na+ 123, K+ 2.7, AST 113, Alk phos 222, t.bili 10.3, INR 2.2. He was admitted and GI was consulted. RUQ [place] was obtained which showed:  Cirrhotic appearance of the liver with moderate ascites.   Main portal vein thrombosis.   Gallbladder sludge. Thickening of the gallbladder wall may be secondary to chronic liver disease/hypoalbuminemia. Acute cholecystitis in the absence of a sonographic Murphy's sign is less likely.  He then underwent EGD later that same day which showed grade II esophageal varices with stigmata of recent bleeding which were banded as well as portal hypertensive gastropathy without gastric varices.   IR has been consulted regarding portal vein thrombosis as well as possible TIPS procedure due to recurrent bleeding esophageal varices and recurrent ascites.  Patient seen in [place] room just prior to undergoing paracentesis. He denies any complaint currently, feels like his eyes are more yellow today. He notices his abdomen feels fuller but does not have any discomfort from it. He has not had any nausea or vomiting today. He takes lactulose regularly at home due to history of confusion and sleepiness in setting of ongoing ETOH use and cirrhosis - per  chart review this was recently decreased from TID to BID due to "significant bowel movements". Patient states he normally has 1 BM per day, sometimes 2, does not eat very much recently due to ongoing nausea, currently and sleeps a lot when he is home. Notices swelling sometimes in his legs and stomach that comes and goes. We reviewed recent hospital course including portal vein thrombosis, cirrhosis, ascites and esophageal varices. Briefly discussed possibility of TIPS procedure in the future in relation to the above and he is interested in learning more if deemed a candidate by IR/primary team. He is also interested in rehab for ETOH abuse.  Past Medical History:  Diagnosis Date   Anxiety    Arthritis    affects hands, shoulder, neck, knees, hips, ankles, toes   Chronic back pain    Diabetes mellitus    diet controlled   Eczema    Esophageal varices (HCC)    Fatty liver, alcoholic    GERD (gastroesophageal reflux disease)    Glaucoma    slight case   Hypertension    Iron deficiency anemia due to chronic blood loss 11/25/2022   Psoriasis    Sleep apnea    Substance abuse (HCC)    Vertigo 09/11/2017   Wears glasses     Past Surgical History:  Procedure Laterality Date   COLONOSCOPY WITH PROPOFOL N/A 04/05/2022   Procedure: COLONOSCOPY WITH PROPOFOL;  Surgeon: Lanelle Bal, DO;  [facility];  Service: Endoscopy;  Laterality: N/A;  10:45 AM,unable to reach pt to move up   ESOPHAGEAL BANDING N/A 08/26/2022   Procedure: ESOPHAGEAL BANDING;  Surgeon: Earnest Bailey  K, DO;  Location: [facility];  Service: Endoscopy;  Laterality: N/A;   ESOPHAGEAL BANDING  09/24/2022   Procedure: ESOPHAGEAL BANDING;  Surgeon: Corbin Ade, MD;  Location: [facility];  Service: Endoscopy;;   ESOPHAGOGASTRODUODENOSCOPY (EGD) WITH PROPOFOL N/A 08/26/2022   Procedure: ESOPHAGOGASTRODUODENOSCOPY (EGD) WITH PROPOFOL;  Surgeon: Lanelle Bal, DO;  [facility];  Service:  Endoscopy;  Laterality: N/A;   ESOPHAGOGASTRODUODENOSCOPY (EGD) WITH PROPOFOL N/A 09/24/2022   Procedure: ESOPHAGOGASTRODUODENOSCOPY (EGD) WITH PROPOFOL;  Surgeon: Corbin Ade, MD;  [facility];  Service: Endoscopy;  Laterality: N/A;  hematemesis, esophageal varices, melena   MULTIPLE TOOTH EXTRACTIONS     POLYPECTOMY  04/05/2022   Procedure: POLYPECTOMY;  Surgeon: Lanelle Bal, DO;  [facility];  Service: Endoscopy;;   POSTERIOR CERVICAL LAMINECTOMY  04/21/2012   Procedure: POSTERIOR CERVICAL LAMINECTOMY;  Surgeon: Mariam Dollar, MD;  Location: [facility];  Service: Neurosurgery;  Laterality: Left;  Posterior Cervical laminectomy/foraminotomy and diskectomy, left cervical seven-thoracic one   ROTATOR CUFF REPAIR     THORACIC DISCECTOMY Left 04/03/2014   Procedure: Left Thoracic Seven to Eight, Thoracic Eight to Nine Thoracic Discectomy ;  Surgeon: Mariam Dollar, MD;  Location: [facility];  Service: Neurosurgery;  Laterality: Left;    Allergies: Neosporin + pain relief max st [neomy-bacit-polymyx-pramoxine]  Medications: Prior to Admission medications   Medication Sig Start Date End Date Taking? Authorizing Provider  busPIRone (BUSPAR) 5 MG tablet Take 1 tablet (5 mg total) by mouth 2 (two) times daily. Patient taking differently: Take 5 mg by mouth daily as needed (anxiety). 12/08/22  Yes Shon Hale, MD  carvedilol (COREG) 3.125 MG tablet Take 1 tablet (3.125 mg total) by mouth 2 (two) times daily with a meal. 12/09/22 03/09/23 Yes Emokpae, Courage, MD  ferrous sulfate 324 MG TBEC Take 1 tablet (324 mg total) by mouth daily with breakfast. 12/08/22  Yes Emokpae, Courage, MD  FLUoxetine (PROZAC) 10 MG capsule TAKE 4 CAPSULES BY MOUTH EVERY DAY Patient taking differently: Take 10 mg by mouth 2 (two) times daily. 02/17/22  Yes Paseda, Baird Kay, FNP  folic acid (FOLVITE) 1 MG tablet TAKE 1 TABLET BY MOUTH DAILY 01/10/23  Yes Mahon, Toni Amend L, NP  furosemide (LASIX)  40 MG tablet Take 1 tablet (40 mg total) by mouth daily. 12/11/22 03/11/23 Yes Emokpae, Courage, MD  gabapentin (NEURONTIN) 100 MG capsule Take 1 tablet by mouth daily. 10/05/22  Yes [provider]  insulin glargine (LANTUS SOLOSTAR) 100 UNIT/ML Solostar Pen Inject 30 Units into the skin at bedtime. 12/23/22  Yes Reardon, Freddi Starr, NP  insulin lispro (HUMALOG KWIKPEN) 100 UNIT/ML KwikPen Inject 5-11 Units into the skin 3 (three) times daily. 12/23/22  Yes Reardon, Freddi Starr, NP  lactulose (CHRONULAC) 10 GM/15ML solution Take 15 mLs (10 g total) by mouth 2 (two) times daily. 02/21/23  Yes Mahon, Frederik Schmidt, NP  levocetirizine (XYZAL) 5 MG tablet Take 1 tablet by mouth daily. 10/05/22  Yes [provider]  midodrine (PROAMATINE) 5 MG tablet Take 1 tablet (5 mg total) by mouth 3 (three) times daily with meals. To help Prevent Low Blood Pressure Patient taking differently: Take 5 mg by mouth 2 (two) times daily with a meal. To help Prevent Low Blood Pressure 12/08/22  Yes Emokpae, Courage, MD  Multiple Vitamin (MULTIVITAMIN) tablet Take 1 tablet by mouth daily.   Yes [provider]  ondansetron (ZOFRAN-ODT) 4 MG disintegrating tablet Take 1  tablet (4 mg total) by mouth every 8 (eight) hours as needed for nausea or vomiting. 12/21/22  Yes Aida Raider, NP  spironolactone (ALDACTONE) 50 MG tablet Take 1 tablet (50 mg total) by mouth daily. 12/09/22 12/04/23 Yes Emokpae, Courage, MD  thiamine (VITAMIN B-1) 100 MG tablet Take 1 tablet (100 mg total) by mouth daily. 12/08/22  Yes Shon Hale, MD  XIFAXAN 550 MG TABS tablet TAKE 1 TABLET BY MOUTH TWICE DAILY 02/14/23 02/14/24 Yes Mahon, Courtney L, NP  Continuous Glucose Sensor (DEXCOM G7 SENSOR) MISC Inject 1 application  into the skin as directed. Change sensor every 10 days as directed. 12/23/22   Dani Gobble, NP  glucose blood test strip Use as instructed to monitor glucose 4 times daily 12/09/21   Dani Gobble, NP  Insulin Pen  Needle (PEN NEEDLES) 32G X 4 MM MISC 1 each by Does not apply route in the morning, at noon, in the evening, and at bedtime. Use to inject insulin 4 times daily 05/12/22   Dani Gobble, NP  pantoprazole (PROTONIX) 40 MG tablet Take 1 tablet (40 mg total) by mouth 2 (two) times daily. Patient not taking: Reported on 02/22/2023 12/08/22 06/06/23  Shon Hale, MD     Family History  Problem Relation Age of Onset   Diabetes Father    Cancer - Lung Father    Hypertension Other    Cancer - Lung Other     Social History   Socioeconomic History   Marital status: Divorced    Spouse name: Not on file   Number of children: Not on file   Years of education: Not on file   Highest education level: Not on file  Occupational History    Comment: Truck to storage  Tobacco Use   Smoking status: Every Day    Current packs/day: 0.00    Average packs/day: 0.3 packs/day for 40.0 years (10.0 ttl pk-yrs)    Types: Cigarettes    Start date: 05/09/1982    Last attempt to quit: 05/09/2022    Years since quitting: 0.7   Smokeless tobacco: Never   Tobacco comments:    pt trying to quit on his own  Vaping Use   Vaping status: Former  Substance and Sexual Activity   Alcohol use: Not Currently    Alcohol/week: 12.0 standard drinks of alcohol    Types: 12 Cans of beer per week    Comment: last drink 2 beers on Sunday 02/20/2023   Drug use: No   Sexual activity: Yes    Birth control/protection: None  Other Topics Concern   Not on file  Social History Narrative   Not on file   Social Determinants of Health   Financial Resource Strain: Medium Risk (10/07/2022)   Overall Financial Resource Strain (CARDIA)    Difficulty of Paying Living Expenses: Somewhat hard  Food Insecurity: Low Risk  (01/18/2023)   Received from [facility]   Food vital sign    Within the past 12 months, you worried that your food would run out before you got money to buy more: Never true    Within the past 12 months, the  food you bought just didn't last and you didn't have money to get more. : Never true  Transportation Needs: Not on file (01/18/2023)  Physical Activity: Not on file  Stress: Stress Concern Present (10/07/2022)   [employer] of Occupational Health - Occupational Stress Questionnaire    Feeling of Stress : To some  extent  Social Connections: Not on file     Review of Systems: A 12 point ROS discussed and pertinent positives are indicated in the HPI above.  All other systems are negative.  Review of Systems  Constitutional:  Positive for fatigue. Negative for chills and fever.  Respiratory:  Negative for cough and shortness of breath.   Cardiovascular:  Negative for chest pain.  Gastrointestinal:  Positive for abdominal distention. Negative for abdominal pain, blood in stool, diarrhea, nausea and vomiting.  Skin:  Positive for color change (yellow eyes and skin).  Neurological:  Negative for headaches.  Hematological:  Bruises/bleeds easily.  Psychiatric/Behavioral:  Negative for confusion.     Vital Signs: BP (!) 95/54   Pulse 71   Temp 98.1 F (36.7 C)   Resp 16   Ht 5\' 6"  (1.676 m)   Wt 172 lb 13.5 oz (78.4 kg)   SpO2 95%   BMI 27.90 kg/m   Physical Exam Vitals and nursing note reviewed.  Constitutional:      General: He is not in acute distress.    Appearance: He is ill-appearing.  HENT:     Head: Normocephalic.  Eyes:     General: Scleral icterus present.  Cardiovascular:     Rate and Rhythm: Normal rate.  Pulmonary:     Effort: Pulmonary effort is normal.  Abdominal:     General: There is distension.     Palpations: Abdomen is soft.     Tenderness: There is no abdominal tenderness.  Skin:    General: Skin is warm and dry.     Findings: Bruising (abdomen, bilateral arms) present.  Neurological:     Mental Status: He is alert and oriented to person, place, and time.  Psychiatric:        Mood and Affect: Mood normal.        Behavior: Behavior normal.         Thought Content: Thought content normal.        Judgment: Judgment normal.      Imaging: DG Chest Port 1 View  Result Date: 02/22/2023 CLINICAL DATA:  100030 Leukocytosis 100030 97749 Alcoholic hepatitis 97749 EXAM: PORTABLE CHEST 1 VIEW COMPARISON:  Chest x-ray 08/02/2022, CT chest 03/13/2014 FINDINGS: The heart and mediastinal contours are unchanged. Biapical pleural/pulmonary scarring. Cystic changes of lung apices. Question left base airspace opacity that may represent atelectasis. Chronic coarsened markings with no overt pulmonary edema. No pleural effusion. No pneumothorax. No acute osseous abnormality. IMPRESSION: 1. Question left base airspace opacity that may represent atelectasis or eventration of the hemidiaphragm. Recommend repeat PA and lateral view of the chest. 2.  Emphysema (ICD10-J43.9). Electronically Signed   By: Tish Frederickson M.D.   On: 02/22/2023 20:32   US Abdomen Limited RUQ (LIVER/GB)  Result Date: 02/22/2023 CLINICAL DATA:  Upper abdominal pain and nausea. EXAM: ULTRASOUND ABDOMEN LIMITED RIGHT UPPER QUADRANT COMPARISON:  August 02, 2022 FINDINGS: Gallbladder: Echogenic material is seen dependently within the gallbladder lumen. Gallbladder wall is thickened to 5.6 mm. No Murphy's sign. Common bile duct: Diameter: 4.5 mm. Liver: Nodular contour with increased coarse echogenicity. Severely diminished flow to the proximal portal vein with no flow visualized to the distal main portal vein, right portal vein and left portal vein. Other: Moderate-sized ascites. IMPRESSION: Cirrhotic appearance of the liver with moderate ascites. Main portal vein thrombosis. Gallbladder sludge. Thickening of the gallbladder wall may be secondary to chronic liver disease/hypoalbuminemia. Acute cholecystitis in the absence of a sonographic Murphy's sign is  less likely. These results will be called to the ordering clinician or representative by the Radiologist Assistant, and communication  documented in the PACS or [employer]. Electronically Signed   By: Ted Mcalpine M.D.   On: 02/22/2023 12:29    Labs:  CBC: Recent Labs    12/08/22 0508 12/24/22 0823 01/26/23 0840 02/22/23 0904 02/22/23 0905 02/22/23 1130 02/22/23 2009 02/23/23 0020 02/23/23 0411 02/23/23 0809  WBC 7.5 9.6 9.0  --  12.7*  --   --   --   --   --   HGB 9.4* 11.2* 13.1   < > 11.5*   < > 9.1* 8.8* 8.4* 8.3*  HCT 27.1* 31.1* 35.5*   < > 32.1*   < > 25.6* 24.4* 23.5* 23.9*  PLT 102* 104* 101*  --  110*  --   --   --   --   --    < > = values in this interval not displayed.    COAGS: Recent Labs    11/05/22 1028 12/07/22 1041 12/08/22 0508 02/22/23 0905  INR 1.5* 1.9* 1.9* 2.2*    BMP: Recent Labs    09/28/22 0438 11/05/22 1028 12/07/22 0920 12/08/22 0508 02/22/23 0904 02/22/23 0905  NA 131* 135 126* 130* 125* 123*  K 3.7 3.1* 3.9 4.3 2.8* 2.7*  CL 96* 91* 94* 102 78* 77*  CO2 26 28 24  20*  --  32  GLUCOSE 203* 104* 196* 216* 217* 220*  BUN 10 5* 5* 5* 16 18  CALCIUM 7.6* 8.6* 8.4* 8.2*  --  8.4*  CREATININE 0.83 1.11 0.83 0.79 1.10 0.99  GFRNONAA >60  --  >60 >60  --  >60    LIVER FUNCTION TESTS: Recent Labs    11/05/22 1028 11/11/22 1023 12/07/22 1041 12/08/22 0508 02/22/23 0905  BILITOT 3.3* 2.9* 3.6* 3.7* 10.3*  AST 66*  --  53* 56* 113*  ALT 21  --  20 18 38  ALKPHOS 294*  --  131* 117 222*  PROT 6.5  --  5.8* 5.7* 6.5  ALBUMIN 3.1*  --  2.3* 2.6* 2.6*    TUMOR MARKERS: No results for input(s): "AFPTM", "CEA", "CA199", "CHROMGRNA" in the last 8760 hours.  Assessment and Plan:  57 y/o M with history of ETOH abuse, ETOH hepatitis, ETOH cirrhosis with recurrent ascites, portal gastropathy and esophageal varices requiring banding x 2 (March and August 2024) admitted yesterday with hematemesis which required additional banding on EGD. Abdominal US was also obtained which showed main portal vein thrombosis. IR has been consulted regarding portal vein  thrombosis, cirrhosis with recurrent ascites and recurrent bleeding esophageal varices.  Patient seen today during paracentesis - we reviewed his most recent hospital course including esophageal varices in the context of cirrhosis as well as presence of portal vein thrombosis. We also discussed indications for TIPS procedure and that he is currently undergoing workup to determine if he is a candidate for said procedure. He has a fair understanding of his current condition and is agreeable to proceed with TIPS workup and formal TIPS consult if deemed a candidate.  MELD 3.0 = 29 based on today's labs indicating ~20% 90 day mortality. T.bili and INR continue to trend upward while Na+ and creatinine have improved. Ammonia 57. CTA BRTO protocol and TTE pending.   Patient reviewed in detail by Dr. Milford Cage who recommends the following:  Complete TIPS workup while inpatient (CTA BRTO protocol and TTE pending) Anticoagulation for portal vein thrombosis given eradication  of esophageal varices post banding with stable hgb and no further hematemesis thus far. No plans for portal vein thrombectomy at this time. Continue lactulose but titrate to 2-3 BMs per day (per patient usually only 1 BM per day and ammonia 57 today). Repeat MELD labs in 1 month. Formal consult as outpatient with Interventional Radiology (order placed for schedulers to contact patient, [facility] contact info in AVS).   Discussed above recommendations who states understanding and agreement. Complete ETOH cessation was encouraged.  No further IR needs at this time - please contact on call interventional radiologist if it is felt patient requires more urgent evaluation.  Thank you for this interesting consult.  I greatly enjoyed meeting ABAD EDGE and look forward to participating in their care.  A copy of this report was sent to the requesting provider on this date.  Electronically Signed: Villa Herb,  PA-C 02/23/2023, 11:27 AM   I spent a total of 55 Miinutes in face to face in clinical consultation, greater than 50% of which was counseling/coordinating care for portal vein thrombosis, esophageal varices.

## 2023-02-23 NOTE — TOC Initial Note (Signed)
Transition of Care [facility]) - Initial/Assessment Note    Patient Details  Name: Shawn Velazquez MRN: [id number] Date of Birth: 05/13/1966  Transition of Care [facility]) CM/SW Contact:    Annice Needy, LCSW Phone Number: 02/23/2023, 2:54 PM  Clinical Narrative:                 Patient from home with spouse. Admitted for GI bleed. Considered high risk for readmission. TOC consulted for SA resources. Patient is independent at baseline. He does not drive. His wife and girlfriend transport to appointments. Discussed consult for SA resources. Patient is agreeable to SA resources. Inpatient and Outpatient SA resources added to patient's d/c instructions.   Expected Discharge Plan: Home/Self Care Barriers to Discharge: Continued Medical Work up   Patient Goals and CMS Choice Patient states their goals for this hospitalization and ongoing recovery are:: return home          Expected Discharge Plan and Services       Living arrangements for the past 2 months: Single Family Home                                      Prior Living Arrangements/Services Living arrangements for the past 2 months: Single Family Home Lives with:: Significant Other Patient language and need for interpreter reviewed:: Yes        Need for Family Participation in Patient Care: No (Comment) Care giver support system in place?: No (comment)   Criminal Activity/Legal Involvement Pertinent to Current Situation/Hospitalization: No - Comment as needed  Activities of Daily Living Home Assistive Devices/Equipment: None ADL Screening (condition at time of admission) Patient's cognitive ability adequate to safely complete daily activities?: Yes Is the patient deaf or have difficulty hearing?: No Does the patient have difficulty seeing, even when wearing glasses/contacts?: No Does the patient have difficulty concentrating, remembering, or making decisions?: No Patient able to express need for assistance with  ADLs?: Yes Does the patient have difficulty dressing or bathing?: No Independently performs ADLs?: Yes (appropriate for developmental age) Does the patient have difficulty walking or climbing stairs?: Yes Weakness of Legs: Both Weakness of Arms/Hands: Both  Permission Sought/Granted                  Emotional Assessment     Affect (typically observed): Appropriate Orientation: : Oriented to Self, Oriented to Place, Oriented to  Time, Oriented to Situation Alcohol / Substance Use: Not Applicable Psych Involvement: No (comment)  Admission diagnosis:  GI bleed [K92.2] Upper GI bleed [K92.2] Patient Active Problem List   Diagnosis Date Noted   GI bleed 02/22/2023   Elevated bilirubin 02/22/2023   Esophageal varices with bleeding in diseases classified elsewhere (HCC) 02/22/2023   Symptomatic hypotension 12/07/2022   Iron deficiency anemia due to chronic blood loss 11/25/2022   Decompensated hepatic cirrhosis (HCC) 09/27/2022   Thrombocytopenia 09/26/2022   Acute GI bleeding 09/25/2022   Gastrointestinal hemorrhage with hematemesis 09/24/2022   Encephalopathy, hepatic (HCC) 08/28/2022   Secondary esophageal varices with bleeding (HCC) 08/27/2022   ABLA (acute blood loss anemia) 08/26/2022   Upper GI bleed 08/25/2022   Hyponatremia 08/25/2022   Prolonged QT interval 08/25/2022   Symptomatic anemia 08/25/2022   Alcoholic hepatitis with ascites 08/04/2022   Alcoholic cirrhosis of liver with ascites (HCC) 08/03/2022   Hypokalemia 08/03/2022   Anasarca 08/02/2022   Alcohol abuse 08/02/2022   Lower abdominal pain  05/12/2022   Elevated LFTs 05/12/2022   Diabetes mellitus (HCC) 02/18/2022   Alcoholic fatty liver 02/18/2022   Arthritis 02/18/2022   Chronic back pain 02/18/2022   Eczema 02/18/2022   Gastroesophageal reflux disease 02/18/2022   Glaucoma 02/18/2022   Psoriasis 02/18/2022   Substance abuse (HCC) 02/18/2022   Class 1 obesity 10/29/2021   Anxiety and  depression 08/01/2021   Acute bronchitis 07/21/2021   Sleep apnea 07/21/2021   Uncontrolled type 2 diabetes mellitus with hyperglycemia (HCC) 07/01/2020   Anxiety 07/01/2020   Essential hypertension 07/01/2020   Hyperlipidemia 07/01/2020   Screening for colon cancer 07/01/2020   Vertigo 09/11/2017   HNP (herniated nucleus pulposus), thoracic 04/03/2014   PCP:  [name], MD Pharmacy:   [facility] [phone] - [place], [place] - [address] [place] [address] Phone: [phone] Fax: [phone]     Social Determinants of Health (SDOH) Social History: SDOH Screenings   Food Insecurity: Low Risk  (01/18/2023)   Received from [facility]  Housing: Low Risk  (12/08/2022)  Utilities: Low Risk  (01/18/2023)   Received from [facility]  Depression (PHQ2-9): High Risk (10/07/2022)  Financial Resource Strain: Medium Risk (10/07/2022)  Stress: Stress Concern Present (10/07/2022)  Tobacco Use: High Risk (02/23/2023)   SDOH Interventions:     Readmission Risk Interventions    09/27/2022   11:21 AM 08/26/2022   11:17 AM  Readmission Risk Prevention Plan  Transportation Screening Complete Complete  Home Care Screening  Complete  Medication Review (RN CM)  Complete  HRI or Home Care Consult Complete   Social Work Consult for Recovery Care Planning/Counseling Complete   Palliative Care Screening Not Applicable   Medication Review Oceanographer) Complete

## 2023-02-23 NOTE — Progress Notes (Signed)
*  PRELIMINARY RESULTS* Echocardiogram 2D Echocardiogram has been performed.  Stacey Drain 02/23/2023, 2:08 PM

## 2023-02-23 NOTE — Progress Notes (Signed)
Pt has had busy day with procedures and lab draws. Pt has no c/o pain this shift. Voiding without difficulty, tolerating food/fluid w/o n/v. Pt states he has had additional stool this evening right after eating supper but did not call staff to observe. He states stool was black. Advised pt to not flush if he has another stool but to call nurse to evaluate. States understanding. IVF continue as ordered without s/s infiltration. DDI to right abd s/p paracentesis today. Hgb remain stable, VSS.

## 2023-02-23 NOTE — Assessment & Plan Note (Addendum)
-  Following GI service recommendation IR service has been consulted for potential thrombectomy -From esophageal varices. -CTA has demonstrated no thrombus in his portal vein. -Continue supportive care. -Follow hemoglobin trend. -IR service will follow patient up in about 4-6 weeks to determine TIPS timing.

## 2023-02-23 NOTE — Progress Notes (Signed)
Subjective: He notes a BM this morning with dark tarry stool and scant BRB noted on stool. Denies abdominal pain. No nausea or vomiting. He is feeling much better.   Objective: Vital signs in last 24 hours: Temp:  [97.8 F (36.6 C)-98.5 F (36.9 C)] 98.1 F (36.7 C) (08/21 0452) Pulse Rate:  [71-88] 78 (08/21 0653) Resp:  [12-20] 17 (08/20 1828) BP: (80-132)/(49-84) 84/56 (08/21 0737) SpO2:  [85 %-100 %] 94 % (08/21 0737) Weight:  [78.4 kg] 78.4 kg (08/20 1207) Last BM Date : 02/22/23 General:   Alert and oriented, pleasant Head:  Normocephalic and atraumatic. Eyes:  No icterus, sclera clear. Conjuctiva pink.  Heart:  S1, S2 present, no murmurs noted.  Lungs: Clear to auscultation bilaterally, without wheezing, rales, or rhonchi.  Abdomen:  Bowel sounds present, soft, non-tender, non-distended. No HSM or hernias noted. No rebound or guarding. No masses appreciated  Msk:  Symmetrical without gross deformities. Normal posture. Pulses:  Normal pulses noted. Extremities:  Without clubbing or edema. Neurologic:  Alert and  oriented x4;  grossly normal neurologically. Psych:  Alert and cooperative. Normal mood and affect.  Intake/Output from previous day: 08/20 0701 - 08/21 0700 In: 2759.6 [I.V.:1281.9; IV Piggyback:1477.7] Out: 400 [Urine:400] Intake/Output this shift: No intake/output data recorded.  Lab Results: Recent Labs    02/22/23 0905 02/22/23 1130 02/22/23 2009 02/23/23 0020 02/23/23 0411  WBC 12.7*  --   --   --   --   HGB 11.5*   < > 9.1* 8.8* 8.4*  HCT 32.1*   < > 25.6* 24.4* 23.5*  PLT 110*  --   --   --   --    < > = values in this interval not displayed.   BMET Recent Labs    02/22/23 0904 02/22/23 0905  NA 125* 123*  K 2.8* 2.7*  CL 78* 77*  CO2  --  32  GLUCOSE 217* 220*  BUN 16 18  CREATININE 1.10 0.99  CALCIUM  --  8.4*   LFT Recent Labs    02/22/23 0905  PROT 6.5  ALBUMIN 2.6*  AST 113*  ALT 38  ALKPHOS 222*  BILITOT 10.3*    PT/INR Recent Labs    02/22/23 0905  LABPROT 25.0*  INR 2.2*   Assessment: Shawn Velazquez is a 57 year old male with history of alcoholic liver cirrhosis, esophageal varices with history of variceal bleed, alcohol abuse, alcoholic hepatitis, IDA following with hematology for IV iron who presented to the ER yesterday with acute onset of hematemesis with multiple episodes and a single episode of melena.  I consulted for further evaluation.  Hematemesis/melena: Acute onset around 4 AM yesterday morning with 10-15 episodes of hematemesis and a single episode of melena.  Hemoglobin 11.5 down from 13.13 weeks ago.  INR 2.2 and platelets 110 yesterday.  Patient underwent EGD which showed grade 2 esophageal varices with stigmata of recent bleeding status post banding.  Also with portal hypertensive gastropathy, no gastric varices.  Will need octreotide and PPI drip for 72 hours, repeat EGD in 4 weeks, ceftriaxone 2 g daily x 5 days for SBP prophylaxis and will need to restart Coreg upon discharge.  Hemoglobin this morning was 8.4, down from 8.8 yesterday. He notes a dark, tarry stool as well as a small amount of brb on the stool. No vomiting. He underwent CT Angio Abd/Pelvis BRTO this morning which is still pending.  Will follow for results of CT angio, monitor hgb closely for  now.   Hyperbilirubinemia/elevated LFTs: History of alcohol abuse though patient reported he only drinks couple of beers per week with last alcohol use on Sunday.  DF yesterday 65.5 using 13 PT control. He was not started on steroids yesterday as underlying infection needed to be ruled out first given mild leukocytosis. DF today is 24.6. No CBC done today, will obtain repeat, as no obvious infection has been identified, if DF remains elevated and WBC no worse, would recommend starting prednisolone 40mg  daily tomorrow.   yesterday patient also reported some upper abdominal pain and vomiting that started Monday he underwent right  upper quadrant ultrasound which showed PVT.  He is not a candidate for anticoagulation given recent esophageal variceal bleed. Lipase was mildly elevated at 61. Denies abdominal pain this morning.   Alcoholic cirrhosis: Decompensated with history of ascites, coagulopathy, esophageal varices with bleeding.  MELD 3.0 is 29.  had 1-2+ lower extremity pitting edema on admission, he does report compliance with Lasix and spironolactone as outpatient, however due to hyponatremia and hypokalemia diuretics have been on hold. Para this morning with yield of, 3.3 no SBP noted on fluid cell count. INR is 2.3 today, will give a dose of vitamin K.  Ammonia mildly elevated at 57 but No signs of HE at this time but will need to continue to monitor mental status.     Plan: Trend h&h, transfuse for hgb <7 Continue PPI infusion/octreotide x72 hours Rocephin 2g x5 days Diuretics on hold until electrolytes corrected MELD labs Daily Correction of hyponatremia and hypokalemia COMPLETE ETOH CESSATION Resume coreg as outpatient once BP improved Repeat EGD in 4 weeks  Monitor for overt GI bleeding  Monitor for mental status changes  Vitamin k 10mg   Follow CBC, start prednisolone tomorrow if DF still elevated and WBC stable     LOS: 1 day    02/23/2023, 9:30 AM   Radford Pease L. Jeanmarie Hubert, MSN, APRN, AGNP-C Adult-Gerontology Nurse Practitioner [facility] at [facility]

## 2023-02-23 NOTE — Progress Notes (Signed)
Patient tolerated right sided paracentesis and 25G of IV albumin well today and 3.3 Liters of clear yellow ascites removed with labs collected and sent for processing. Patient verbalized understanding of post procedure and transported to CT at this time with no acute distress noted. Report called to [facility] on unit 300.

## 2023-02-23 NOTE — Progress Notes (Signed)
   02/23/23 0737  Vitals  BP (!) 84/56  BP Location Right Arm  BP Method Automatic  Patient Position (if appropriate) Lying  Pulse Rate Source Dinamap  Level of Consciousness  Level of Consciousness Alert  MEWS COLOR  MEWS Score Color Green  Oxygen Therapy  SpO2 94 %  O2 Device Nasal Cannula  O2 Flow Rate (L/min) 2 L/min  MEWS Score  MEWS Temp 0  MEWS Systolic 1  MEWS Pulse 0  MEWS RR 0  MEWS LOC 0  MEWS Score 1   Pt awake, A&O. Denies any c/o pain, SOB, or chest discomfort. VS as above s/p 0.9% saline IV bolus. MD [facility] notified of current status, no new orders received.

## 2023-02-23 NOTE — Discharge Instructions (Addendum)
Substance Abuse Resources:   Outpatient Substance Use Treatment Services  Agency Name: [facility] Address: [address] [place], [place] [address] Phone: [phone] / [phone] Website: www.co.[place]..us Services Offered: Support groups for Bipolar, substance abuse, anger management, panic depression and anxiety. Mobile crisis unit, outpatient therapy, substance abuse treatment.   [facility] [address] [place], [place] [address] [phone] Private insurance, Medicare A&B, and [facility]  [facility] (Alcohol and Drug Services) [address] [place], [place] [address] [phone] Medicaid, Self Pay  [facility] [address] Leonard Schwartz [place], [place] [phone] Medicaid and [facility], Self Pay  [facility] [address] [place], [place] [phone] [facility], and Self Pay  [facility] [address] [place], [place] [address] [phone] or [phone] Private Insurance Only  [facility] [address] Ginette Otto, [place] [address] [phone] Medicaid, Medicare, Private Insurance  [facility] Counseling Services at the [facility] [address] [place], [place] [address] [phone] Services are free or reduced  [facility] [address] [phone] Self Pay only, sliding scale  [facility] [address] [place], [place] [address] [phone] ([facility]) Self Pay, Medicaid Only  [facility] [address], [place] [address] [phone] Medicaid, Medicare, Private Insurance  Residential Substance Use Treatment Services  [facility] [address] [place], [place] [address] [phone] or [phone] Detox (Medicare, Medicaid, private insurance, and self  pay) Residential Rehab 14 days (Medicare, [place], private insurance, and self pay)  [facility] (Residential Treatment Services) [address] [place], [place] [phone] Male and Male Detox (Self Pay and Medicaid limited availability) Rehab only Male ([place] and self pay only)  [facility] [address] [place], [place] [address] [phone] or [phone] Detox and Residential Treatment Private Insurance Only   [facility] [address] [place], [place] [address] [phone] Treatment Only, must make assessment appointment, and must be sober for assessment appointment. Self Pay Only, Medicare A&B, [facility] [place] ID only! *Transportation assistance offered from [place] on [employer] [address] [place], [place] [address] Walk in interviews M-Sat 8-4p No pending legal charges [phone]  [facility]ATC: [facility] Referral [address] [place], [place] [phone] (Self Pay, [facility]) [facility] [address] [place], [place] [address] [phone] Detox and Residential Treatment Medicare and Private Insurance   [address] [place], [place] [address] 28 Day Women's Facility: [phone] 28 Day Men's Facility: [phone] Long-term Residential Program: [phone] Males 25 and Over (No Insurance, upfront fee)  [facility] [address] [place], [place] [address] [phone] Private Insurance with [place], Private Pay [facility] [address] [place], [place] [address] Local [phone] Private Insurance Only  [facility] [address] [place], [place] [address] [phone] (Males, upfront fee)  [facility] [address] [place], [address] [phone] Private Insurance  Alcoholics Anonymous: RentalRefinancing.at     Rethinking Drinking:  http://rethinkingdrinking.MysteryJet.be  For anyone who drinks, this site offers valuable,  research-based information. What do you think about taking a look at your drinking habits and how they may affect your health?  SMART Recovery: http://www.smartrecovery.org [phone]  [facility] B. Willa Rough, [facility] [address] Laurey Morale [place], [place] [address] [phone] 19.26 miles Schedule online: [email or web address]/providers/[email or web address] Availability: Call for appt -In person and virtual appointments -Specializes in SU and anxiety -  In network (also schedules on https://[email or web address].co )  Areta Haber (Licensed Clinical Addiction Specialist) [address] [place], [place] [address] 20.03 miles [phone] Call or text: [phone] Schedule online: [email or web address] Availability: Call for appt -In person and virtual appointments -Specializes in SU -In network (also schedules on https://[email or web address].co )  [facility] KAREN MAYNARD, LCAS, LCSW [address] [place], [place] [address] 60 miles [phone] Request appointment online: [email or web address] Availability: Call for appt -In person and virtual appointments -Specializes in SU, Trauma and anxiety -In network    [facility] DIANA W. Wendee Copp, LCAS, LCSW [address] [place] [address] [place], [place] [address] [phone] 32.96 miles Schedule online: [email or web address] Availability: Call for appt -In person and virtual appointments -Therapy and SU treatment options -Specializes in SU -In network (also schedules on https://[email or web address].co )   Nutrition Post Hospital Stay Proper nutrition can help your body recover from illness and injury.   Foods and beverages high in protein, vitamins, and minerals help rebuild muscle loss, promote healing, & reduce fall risk.   In addition to eating healthy foods, a nutrition shake is an easy, delicious way to get the nutrition you need during and after your hospital  stay  It is recommended that you continue to drink 2 bottles per day of:       Ensure Plus (or equivalent) for at least 1 month (30 days) after your hospital stay   Tips for adding a nutrition shake into your routine: As allowed, drink one with vitamins or medications instead of water or juice Enjoy one as a tasty mid-morning or afternoon snack Drink cold or make a milkshake out of it Drink one instead of milk with cereal or snacks Use as a coffee creamer   Available at the following grocery stores and pharmacies:           * Karin Golden * [employer] * Costco  * Rite Aid          * Walmart * Sam's Club  * Walgreens      * Target  * BJ's   * CVS  * Lowes Foods   * [facility] [phone]            For COUPONS visit: www.ensure.com/join or RoleLink.com.br   Suggested Substitutions Ensure Plus = Boost Plus = Carnation Breakfast Essentials = Boost Compact Ensure Active Clear = Boost Breeze Glucerna Shake = Boost Glucose Control = Carnation Breakfast Essentials SUGAR FREE      Additional Discharge Instructions   Please get your medications reviewed and adjusted by your Primary MD.  Please request your Primary MD to go over all Hospital Tests and Procedure/Radiological results at the follow up, please get all Hospital records sent to your Prim MD by signing hospital release before you go home.  If you had Pneumonia of Lung problems at the Hospital: Please get a 2 view Chest X ray done in approximately 4 weeks after hospital discharge or sooner if instructed by your Primary MD.  If you have Congestive Heart Failure: Please call your Cardiologist or Primary MD anytime you have any of the following symptoms:  1) 3 pound weight gain in 24 hours or 5 pounds in 1 week  2) shortness of breath, with or without a dry hacking cough  3) swelling in the hands, feet or stomach  4) if you have to sleep on extra pillows at night in order to breathe  Follow  cardiac low salt diet and  1.5 lit/day fluid restriction.  If you have diabetes Accuchecks 4 times/day, Once in AM empty stomach and then before each meal. Log in all results and show them to your primary doctor at your next visit. If any glucose reading is under 80 or above 300 call your primary MD immediately.  If you have Seizure/Convulsions/Epilepsy: Please do not drive, operate heavy machinery, participate in activities at heights or participate in high speed sports until you have seen by Primary MD or a Neurologist and advised to do so again. Per [facility] statutes, patients with seizures are not allowed to drive until they have been seizure-free for six months.  Use caution when using heavy equipment or power tools. Avoid working on ladders or at heights. Take showers instead of baths. Ensure the water temperature is not too high on the home water heater. Do not go swimming alone. Do not lock yourself in a room alone (i.e. bathroom). When caring for infants or small children, sit down when holding, feeding, or changing them to minimize risk of injury to the child in the event you have a seizure. Maintain good sleep hygiene. Avoid alcohol.   If you had Gastrointestinal Bleeding: Please ask your Primary MD to check a complete blood count within one week of discharge or at your next visit. Your endoscopic/colonoscopic biopsies that are pending at the time of discharge, will also need to followed by your Primary MD.  Get Medicines reviewed and adjusted. Please take all your medications with you for your next visit with your Primary MD  Please request your Primary MD to go over all hospital tests and procedure/radiological results at the follow up, please ask your Primary MD to get all Hospital records sent to his/her office.  If you experience worsening of your admission symptoms, develop shortness of breath, life threatening emergency, suicidal or homicidal thoughts you must seek  medical attention immediately by calling 911 or calling your MD immediately  if symptoms less severe.  You must read complete instructions/literature along with all the possible adverse reactions/side effects for all the Medicines you take and that have been prescribed to you. Take any new Medicines after you have completely understood and accpet all the possible adverse reactions/side effects.   Do not drive or operate heavy machinery when taking Pain medications.   Do not take more than prescribed Pain, Sleep and Anxiety Medications  Special Instructions: If you have smoked or chewed Tobacco  in the last 2 yrs please stop smoking, stop any regular Alcohol  and or any Recreational drug use.  Wear Seat belts while driving.  Please note You were cared for by a hospitalist during your hospital stay. If you have any questions about your discharge medications or the care you received while you were in the hospital after you are discharged, you can call the unit and asked to speak with the hospitalist on call if the hospitalist that took care of you is not available. Once you are discharged, your primary care physician will handle any further medical issues. Please note that NO REFILLS for any discharge medications will be authorized once you are discharged, as it is imperative that you return to your primary care physician (or establish a relationship with a primary care physician if you do not have one) for your aftercare needs so that they can reassess your need for medications and monitor your lab values.  You can reach the hospitalist office at phone [phone] or fax [phone]   If  you do not have a primary care physician, you can call [phone] for a physician referral.

## 2023-02-23 NOTE — Plan of Care (Signed)
  Problem: Skin Integrity: Goal: Risk for impaired skin integrity will decrease Outcome: Progressing   Problem: Nutritional: Goal: Maintenance of adequate nutrition will improve Outcome: Not Progressing

## 2023-02-24 DIAGNOSIS — F1010 Alcohol abuse, uncomplicated: Secondary | ICD-10-CM | POA: Diagnosis not present

## 2023-02-24 DIAGNOSIS — R188 Other ascites: Secondary | ICD-10-CM | POA: Diagnosis not present

## 2023-02-24 DIAGNOSIS — R17 Unspecified jaundice: Secondary | ICD-10-CM | POA: Diagnosis not present

## 2023-02-24 DIAGNOSIS — I8511 Secondary esophageal varices with bleeding: Secondary | ICD-10-CM | POA: Diagnosis not present

## 2023-02-24 DIAGNOSIS — K920 Hematemesis: Secondary | ICD-10-CM | POA: Diagnosis not present

## 2023-02-24 DIAGNOSIS — K7290 Hepatic failure, unspecified without coma: Secondary | ICD-10-CM | POA: Diagnosis not present

## 2023-02-24 DIAGNOSIS — D62 Acute posthemorrhagic anemia: Secondary | ICD-10-CM | POA: Diagnosis not present

## 2023-02-24 LAB — GLUCOSE, CAPILLARY
Glucose-Capillary: 176 mg/dL — ABNORMAL HIGH (ref 70–99)
Glucose-Capillary: 331 mg/dL — ABNORMAL HIGH (ref 70–99)
Glucose-Capillary: 228 mg/dL — ABNORMAL HIGH (ref 70–99)
Glucose-Capillary: 264 mg/dL — ABNORMAL HIGH (ref 70–99)

## 2023-02-24 LAB — MAGNESIUM: Magnesium: 1.8 mg/dL (ref 1.7–2.4)

## 2023-02-24 LAB — BASIC METABOLIC PANEL
Anion gap: 9 (ref 5–15)
BUN: 11 mg/dL (ref 6–20)
CO2: 27 mmol/L (ref 22–32)
Calcium: 7.8 mg/dL — ABNORMAL LOW (ref 8.9–10.3)
Chloride: 93 mmol/L — ABNORMAL LOW (ref 98–111)
Creatinine, Ser: 0.76 mg/dL (ref 0.61–1.24)
GFR, Estimated: >60 mL/min (ref >60)
Glucose, Bld: 247 mg/dL — ABNORMAL HIGH (ref 70–99)
Potassium: 2.6 mmol/L — CL (ref 3.5–5.1)
Sodium: 129 mmol/L — ABNORMAL LOW (ref 135–145)

## 2023-02-24 LAB — CBC
HCT: 23.4 % — ABNORMAL LOW (ref 39.0–52.0)
Hemoglobin: 8.2 g/dL — ABNORMAL LOW (ref 13.0–17.0)
MCH: 36.9 pg — ABNORMAL HIGH (ref 26.0–34.0)
MCHC: 35 g/dL (ref 30.0–36.0)
MCV: 105.4 fL — ABNORMAL HIGH (ref 80.0–100.0)
Platelets: 71 10*3/uL — ABNORMAL LOW (ref 150–400)
RBC: 2.22 MIL/uL — ABNORMAL LOW (ref 4.22–5.81)
RDW: 15.7 % — ABNORMAL HIGH (ref 11.5–15.5)
WBC: 7 10*3/uL (ref 4.0–10.5)
nRBC: 0 % (ref 0.0–0.2)

## 2023-02-24 LAB — HEPATIC FUNCTION PANEL
ALT: 36 U/L (ref 0–44)
AST: 113 U/L — ABNORMAL HIGH (ref 15–41)
Albumin: 2.2 g/dL — ABNORMAL LOW (ref 3.5–5.0)
Alkaline Phosphatase: 120 U/L (ref 38–126)
Bilirubin, Direct: 3.3 mg/dL — ABNORMAL HIGH (ref 0.0–0.2)
Indirect Bilirubin: 6 mg/dL — ABNORMAL HIGH (ref 0.3–0.9)
Total Bilirubin: 9.3 mg/dL — ABNORMAL HIGH (ref 0.3–1.2)
Total Protein: 5.1 g/dL — ABNORMAL LOW (ref 6.5–8.1)

## 2023-02-24 LAB — PROTIME-INR
INR: 2 — ABNORMAL HIGH (ref 0.8–1.2)
Prothrombin Time: 22.5 seconds — ABNORMAL HIGH (ref 11.4–15.2)

## 2023-02-24 MED ORDER — LACTULOSE 10 GM/15ML PO SOLN
20.0000 g | Freq: Two times a day (BID) | ORAL | Status: DC
  Administered 2023-02-24 – 2023-02-28 (×8): 20 g via ORAL
  Filled 2023-02-24 (×8): qty 30

## 2023-02-24 MED ORDER — TRAZODONE HCL 50 MG PO TABS
25.0000 mg | ORAL_TABLET | Freq: Once | ORAL | Status: AC
  Administered 2023-02-24: 25 mg via ORAL
  Filled 2023-02-24: qty 1

## 2023-02-24 MED ORDER — POTASSIUM CHLORIDE CRYS ER 20 MEQ PO TBCR
40.0000 meq | EXTENDED_RELEASE_TABLET | Freq: Once | ORAL | Status: AC
  Administered 2023-02-24: 40 meq via ORAL
  Filled 2023-02-24: qty 2

## 2023-02-24 MED ORDER — MAGNESIUM SULFATE IN D5W 1-5 GM/100ML-% IV SOLN
1.0000 g | Freq: Once | INTRAVENOUS | Status: AC
  Administered 2023-02-24: 1 g via INTRAVENOUS
  Filled 2023-02-24: qty 100

## 2023-02-24 MED ORDER — POTASSIUM CHLORIDE CRYS ER 20 MEQ PO TBCR
40.0000 meq | EXTENDED_RELEASE_TABLET | ORAL | Status: AC
  Administered 2023-02-24 (×3): 40 meq via ORAL
  Filled 2023-02-24 (×3): qty 2

## 2023-02-24 MED ORDER — ENSURE ENLIVE PO LIQD
237.0000 mL | Freq: Two times a day (BID) | ORAL | Status: DC
  Administered 2023-02-24 – 2023-02-28 (×7): 237 mL via ORAL

## 2023-02-24 NOTE — Progress Notes (Addendum)
Gastroenterology Progress Note   Referring Provider: No ref. provider found Primary Care Physician:  [name], MD Primary Gastroenterologist:  Hennie Duos. Marletta Lor, DO   Patient ID: Shawn Velazquez; [id number]; [birth year]   Subjective:    States last black stool yesterday. Had a smear of oily stool today but not black. Tolerating clear liquids, feels hungry. No abdominal pain.   Objective:   Vital signs in last 24 hours: Temp:  [97.8 F (36.6 C)-98.4 F (36.9 C)] 98 F (36.7 C) (08/22 0528) Pulse Rate:  [69-86] 86 (08/22 0528) Resp:  [16-18] 17 (08/21 1918) BP: (91-99)/(52-63) 96/63 (08/22 0528) SpO2:  [91 %-97 %] 91 % (08/22 0528) Last BM Date : 02/23/23 General:   Alert,  Well-developed, well-nourished, pleasant and cooperative in NAD Head:  Normocephalic and atraumatic. Eyes:  Sclera clear, no icterus.     Heart:  Regular rate and rhythm; no murmurs, clicks, rubs,  or gallops. Abdomen:  Soft, nontender and mild distention.  Normal bowel sounds, without guarding, and without rebound.   Extremities:  Without clubbing, deformity. Trace bilateral pedal edema. Neurologic:  Alert and  oriented x4;  grossly normal neurologically. No asterixis. Skin:  Intact without significant lesions or rashes. Psych:  Alert and cooperative. Normal mood and affect.  Intake/Output from previous day: 08/21 0701 - 08/22 0700 In: 1876.2 [P.O.:860; I.V.:867.9; IV Piggyback:148.3] Out: 1750 [Urine:1750] Intake/Output this shift: No intake/output data recorded.  Lab Results: CBC Recent Labs    02/22/23 0905 02/22/23 1130 02/23/23 0809 02/23/23 1828 02/24/23 0404  WBC 12.7*  --   --  4.5 7.0  HGB 11.5*   < > 8.3* 8.5* 8.2*  HCT 32.1*   < > 23.9* 22.9* 23.4*  MCV 99.7  --   --  110.6* 105.4*  PLT 110*  --   --  65* 71*   < > = values in this interval not displayed.   BMET Recent Labs    02/22/23 0905 02/23/23 1300 02/24/23 0404  NA 123* 129* 129*  K 2.7* 2.9* 2.6*  CL 77*  89* 93*  CO2 32 28 27  GLUCOSE 220* 292* 247*  BUN 18 17 11   CREATININE 0.99 0.82 0.76  CALCIUM 8.4* 7.7* 7.8*   LFTs Recent Labs    02/22/23 0905 02/23/23 1300  BILITOT 10.3* 11.6*  ALKPHOS 222* 127*  AST 113* 98*  ALT 38 31  PROT 6.5 5.2*  ALBUMIN 2.6* 2.4*   Recent Labs    02/22/23 0905  LIPASE 61*   PT/INR Recent Labs    02/22/23 0905 02/23/23 1300  LABPROT 25.0* 25.4*  INR 2.2* 2.3*       Lab Results  Component Value Date   VITAMINB12 770 08/18/2022   Lab Results  Component Value Date   FOLATE 9.3 08/18/2022      Imaging Studies: CT ANGIO ABD/PELVIS BRTO  Result Date: 02/24/2023 CLINICAL DATA:  Hematemesis, portal vein thrombosis. History of cirrhosis and portal hypertension. EXAM: CTA ABDOMEN AND PELVIS WITHOUT AND WITH CONTRAST TECHNIQUE: Multidetector CT imaging of the abdomen and pelvis was performed using the standard protocol during bolus administration of intravenous contrast. Multiplanar reconstructed images and MIPs were obtained and reviewed to evaluate the vascular anatomy. RADIATION DOSE REDUCTION: This exam was performed according to the departmental dose-optimization program which includes automated exposure control, adjustment of the mA and/or kV according to patient size and/or use of iterative reconstruction technique. CONTRAST:  OMNIPAQUE IOHEXOL 350 MG/ML SOLN COMPARISON:  MRI of  the abdomen 08/03/2022 FINDINGS: VASCULAR Aorta: Normal caliber aorta without aneurysm, dissection, vasculitis or significant stenosis. Mild scattered atherosclerotic vascular calcifications. Celiac: Patent without evidence of aneurysm, dissection, vasculitis or significant stenosis. SMA: Patent without evidence of aneurysm, dissection, vasculitis or significant stenosis. Renals: Both main renal arteries are patent without evidence of aneurysm, dissection, vasculitis, fibromuscular dysplasia or significant stenosis. Small accessory renal arteries are noted  bilaterally. IMA: Patent without evidence of aneurysm, dissection, vasculitis or significant stenosis. Inflow: Patent without evidence of aneurysm, dissection, vasculitis or significant stenosis. Proximal Outflow: Bilateral common femoral and visualized portions of the superficial and profunda femoral arteries are patent without evidence of aneurysm, dissection, vasculitis or significant stenosis. Veins: The superior mesenteric vein, inferior mesenteric vein, main portal vein and intrahepatic right and left portal veins are all widely patent. Splenic vein is relatively small in caliber but remains patent. Significant hypertrophy of the left gastric vein resulting in gastroesophageal varices. Recanalized periumbilical vein. Review of the MIP images confirms the above findings. NON-VASCULAR Lower chest: Moderately large layering left pleural effusion. Associated atelectasis. Subpleural reticulation, and architectural distortion in the periphery of the visualized lungs consistent with pulmonary fibrosis/interstitial lung disease. The heart is normal in size. Esophageal varices are present. Hepatobiliary: Abnormal morphology of the liver. Hypertrophy of the left lobe and relative to the right with a micro nodular contour consistent with cirrhosis. No arterially enhancing mass. Small gallstones are present within the gallbladder lumen. No significant biliary ductal dilatation. Pancreas: Unremarkable. No pancreatic ductal dilatation or surrounding inflammatory changes. Spleen: Normal in size without focal abnormality. Adrenals/Urinary Tract: Unremarkable adrenal glands. No hydronephrosis, nephrolithiasis or enhancing renal mass. Stomach/Bowel: No focal bowel wall thickening or evidence of obstruction. Lymphatic: No suspicious lymphadenopathy. Reproductive: Prostate is unremarkable. Other: Small volume ascites and mesenteric edema. Right inguinal hernia containing ascitic fluid and omental fat. Musculoskeletal: No acute  fracture or aggressive appearing lytic or blastic osseous lesion. IMPRESSION: 1. Hepatic cirrhosis with evidence of portal hypertension (esophageal varices, small volume ascites and moderate left hepatic hydrothorax). 2. No evidence of portal venous thrombosis. 3. Hypertrophic left gastric vein and short gastric veins result in moderate esophageal varices. 4.  Aortic Atherosclerosis (ICD10-I70.0). 5. Right inguinal hernia containing omental fat and ascitic fluid. 6. Cholelithiasis. Electronically Signed   By: Malachy Moan M.D.   On: 02/24/2023 06:20   ECHOCARDIOGRAM COMPLETE  Result Date: 02/23/2023    ECHOCARDIOGRAM REPORT   Patient Name:   Shawn Velazquez Date of Exam: 02/23/2023 Medical Rec #:  [id number]         Height:       66.0 in Accession #:    [id number]        Weight:       172.8 lb Date of Birth:  18-Jan-1966        BSA:          1.880 m Patient Age:    56 years          BP:           95/54 mmHg Patient Gender: M                 HR:           71 bpm. [facility] Procedure: 2D Echo, Cardiac Doppler and Color Doppler Indications:    S/P TIPS (transjugular intrahepatic portosystemic shunt)                 [[id number]]  History:  Patient has prior history of Echocardiogram examinations, most                 recent 08/03/2022. Risk Factors:Hypertension, Diabetes and                 Dyslipidemia. Hx of Substance and Alcohol abuse.  Sonographer:    Celesta Gentile RCS Referring Phys: [phone] CARLOS MADERA IMPRESSIONS  1. Left ventricular ejection fraction, by estimation, is 70 to 75%. The left ventricle has hyperdynamic function. The left ventricle has no regional wall motion abnormalities. There is mild concentric left ventricular hypertrophy. Left ventricular diastolic parameters were normal.  2. Right ventricular systolic function is normal. The right ventricular size is normal. Tricuspid regurgitation signal is inadequate for assessing PA pressure.  3. Left atrial size was mildly  dilated.  4. Apparent ascitic fluid visualized.  5. The mitral valve is grossly normal. Trivial mitral valve regurgitation.  6. The aortic valve is tricuspid. There is mild calcification of the aortic valve. Aortic valve regurgitation is not visualized. Aortic valve sclerosis is present, with no evidence of aortic valve stenosis.  7. The inferior vena cava is normal in size with greater than 50% respiratory variability, suggesting right atrial pressure of 3 mmHg. Comparison(s): Prior images reviewed side by side. LVEF vigorous at 70-75%. FINDINGS  Left Ventricle: Left ventricular ejection fraction, by estimation, is 70 to 75%. The left ventricle has hyperdynamic function. The left ventricle has no regional wall motion abnormalities. The left ventricular internal cavity size was normal in size. There is mild concentric left ventricular hypertrophy. Left ventricular diastolic parameters were normal. Right Ventricle: The right ventricular size is normal. No increase in right ventricular wall thickness. Right ventricular systolic function is normal. Tricuspid regurgitation signal is inadequate for assessing PA pressure. Left Atrium: Left atrial size was mildly dilated. Right Atrium: Right atrial size was normal in size. Pericardium: Apparent ascitic fluid visualized. There is no evidence of pericardial effusion. Presence of epicardial fat layer. Mitral Valve: The mitral valve is grossly normal. Trivial mitral valve regurgitation. Tricuspid Valve: The tricuspid valve is grossly normal. Tricuspid valve regurgitation is trivial. Aortic Valve: The aortic valve is tricuspid. There is mild calcification of the aortic valve. There is mild aortic valve annular calcification. Aortic valve regurgitation is not visualized. Aortic valve sclerosis is present, with no evidence of aortic valve stenosis. Pulmonic Valve: The pulmonic valve was grossly normal. Pulmonic valve regurgitation is trivial. Aorta: The aortic root is normal in  size and structure. Venous: The inferior vena cava is normal in size with greater than 50% respiratory variability, suggesting right atrial pressure of 3 mmHg. IAS/Shunts: No atrial level shunt detected by color flow Doppler.  LEFT VENTRICLE PLAX 2D LVIDd:         4.70 cm   Diastology LVIDs:         2.50 cm   LV e' medial:    9.03 cm/s LV PW:         1.20 cm   LV E/e' medial:  10.0 LV IVS:        1.00 cm   LV e' lateral:   9.25 cm/s LVOT diam:     2.00 cm   LV E/e' lateral: 9.8 LV SV:         71 LV SV Index:   38 LVOT Area:     3.14 cm  RIGHT VENTRICLE RV S prime:     14.30 cm/s TAPSE (M-mode): 2.8 cm LEFT ATRIUM  Index        RIGHT ATRIUM           Index LA diam:        4.10 cm 2.18 cm/m   RA Area:     17.60 cm LA Vol (A2C):   56.0 ml 29.79 ml/m  RA Volume:   49.30 ml  26.23 ml/m LA Vol (A4C):   69.9 ml 37.19 ml/m LA Biplane Vol: 64.4 ml 34.26 ml/m  AORTIC VALVE LVOT Vmax:   109.00 cm/s LVOT Vmean:  68.500 cm/s LVOT VTI:    0.226 m  AORTA Ao Root diam: 3.40 cm MITRAL VALVE MV Area (PHT): 3.48 cm    SHUNTS MV Decel Time: 218 msec    Systemic VTI:  0.23 m MV E velocity: 90.40 cm/s  Systemic Diam: 2.00 cm MV A velocity: 84.00 cm/s MV E/A ratio:  1.08 Nona Dell MD Electronically signed by Nona Dell MD Signature Date/Time: 02/23/2023/5:26:08 PM    Final    US Paracentesis  Result Date: 02/23/2023 INDICATION: Patient with history of ETOH cirrhosis, recurrent ascites. Currently admitted for esophageal variceal bleeding. Request for diagnostic and therapeutic paracentesis. EXAM: ULTRASOUND GUIDED DIAGNOSTIC AND THERAPEUTIC PARACENTESIS MEDICATIONS: 7 mL 1% lidocaine. COMPLICATIONS: None immediate. PROCEDURE: Informed written consent was obtained from the patient after a discussion of the risks, benefits and alternatives to treatment. A timeout was performed prior to the initiation of the procedure. Initial ultrasound scanning demonstrates a moderate amount of ascites within the right lower  abdominal quadrant. The right lower abdomen was prepped and draped in the usual sterile fashion. 1% lidocaine was used for local anesthesia. Following this, a 19 gauge, 7-cm, Yueh catheter was introduced. An ultrasound image was saved for documentation purposes. The paracentesis was performed. The catheter was removed and a dressing was applied. The patient tolerated the procedure well without immediate post procedural complication. Patient received post-procedure intravenous albumin; see nursing notes for details. FINDINGS: A total of approximately 3.3 L of clear yellow fluid was removed. Samples were sent to the laboratory as requested by the clinical team. IMPRESSION: Successful ultrasound-guided paracentesis yielding 3.3 liters of peritoneal fluid. Performed by Lynnette Caffey, PA-C Electronically Signed   By: Acquanetta Belling M.D.   On: 02/23/2023 13:27   DG Chest Port 1 View  Result Date: 02/22/2023 CLINICAL DATA:  100030 Leukocytosis 100030 97749 Alcoholic hepatitis 97749 EXAM: PORTABLE CHEST 1 VIEW COMPARISON:  Chest x-ray 08/02/2022, CT chest 03/13/2014 FINDINGS: The heart and mediastinal contours are unchanged. Biapical pleural/pulmonary scarring. Cystic changes of lung apices. Question left base airspace opacity that may represent atelectasis. Chronic coarsened markings with no overt pulmonary edema. No pleural effusion. No pneumothorax. No acute osseous abnormality. IMPRESSION: 1. Question left base airspace opacity that may represent atelectasis or eventration of the hemidiaphragm. Recommend repeat PA and lateral view of the chest. 2.  Emphysema (ICD10-J43.9). Electronically Signed   By: Tish Frederickson M.D.   On: 02/22/2023 20:32   US Abdomen Limited RUQ (LIVER/GB)  Result Date: 02/22/2023 CLINICAL DATA:  Upper abdominal pain and nausea. EXAM: ULTRASOUND ABDOMEN LIMITED RIGHT UPPER QUADRANT COMPARISON:  August 02, 2022 FINDINGS: Gallbladder: Echogenic material is seen dependently within the  gallbladder lumen. Gallbladder wall is thickened to 5.6 mm. No Murphy's sign. Common bile duct: Diameter: 4.5 mm. Liver: Nodular contour with increased coarse echogenicity. Severely diminished flow to the proximal portal vein with no flow visualized to the distal main portal vein, right portal vein and left portal vein. Other: Moderate-sized ascites. IMPRESSION:  Cirrhotic appearance of the liver with moderate ascites. Main portal vein thrombosis. Gallbladder sludge. Thickening of the gallbladder wall may be secondary to chronic liver disease/hypoalbuminemia. Acute cholecystitis in the absence of a sonographic Murphy's sign is less likely. These results will be called to the ordering clinician or representative by the Radiologist Assistant, and communication documented in the PACS or [employer]. Electronically Signed   By: Ted Mcalpine M.D.   On: 02/22/2023 12:29  [2 weeks]  Assessment:    Laquane Delaughter. Liebelt is a 57 year old male with history of alcoholic liver cirrhosis, esophageal varices with history of variceal bleed, alcohol abuse, alcoholic hepatitis, IDA following with hematology for IV iron who presented to the ER yesterday with acute onset of hematemesis with multiple episodes and a single episode of melena. GI consulted for further evaluation.   Hematemesis/melena: Acute onset around 4 AM morning of admission with 10-15 episodes of hematemesis and a single episode of melena.  Hemoglobin 11.5 down from 13.1 three weeks ago. Patient underwent EGD which showed grade 2 esophageal varices with stigmata of recent bleeding status post banding.  Also with portal hypertensive gastropathy, no gastric varices.  Will need octreotide and PPI drip for 72 hours, repeat EGD in 4 weeks, ceftriaxone 2 g daily x 5 days for SBP prophylaxis and will need to restart Coreg upon discharge.   Hemoglobin this morning was 8.2, overall stable last 24 hours. Has not required transfusion this admission.  INR 2.3  yesterday.  Platelets 71,000. No further hematemesis. Last black stool yesterday.     Hyperbilirubinemia/elevated LFTs: History of alcohol abuse though patient reported he only drinks couple of beers per week with last alcohol use on Sunday.  Family report they believe he is drinking more.  DF has been greater than 32, prednisolone held due to leukocytosis, need to rule out underlying infection.  Right upper quadrant ultrasound this admission for upper abdominal pain and vomiting, showed portal vein thrombosis.  Has been evaluated by IR who advised against thrombectomy and recommended anticoagulation given his esophageal varices had been treated. Currently patient is not on anticoagulation.  He underwent CT Angio Abd/Pelvis BRTO this admission as part of workup for TIPS, showing no evidence of portal venous thrombosis, hypertrophic left gastric vein and short gastric veins result in moderate esophageal varices, cholelithiasis, hepatic cirrhosis with evidence of portal hypertension (esophageal varices, small volume ascites and moderate left hepatic hydrothorax).     DF yesterday 68.6 using 13 PT control. He was not started on steroids initially as underlying infection needed to be ruled out first given mild leukocytosis. WBC normal today. INR and Tbili not completed.      Alcoholic cirrhosis: Decompensated with history of ascites, coagulopathy, esophageal varices with bleeding.  MELD 3.0 yesterday 29.  Had 1-2+ lower extremity pitting edema on admission, he does report compliance with Lasix and spironolactone as outpatient, however due to hyponatremia and hypokalemia diuretics have been on hold. Paracentesis yesterday, 3.3 no SBP noted on fluid cell count. INR was 2.3, he received dose of vitamin K.     Ammonia mildly elevated at 57 but no signs of HE at this time but will need to continue to monitor mental status.      Plan:   INR, LFTs to calculate MELD and DF. Will consider prednisolone. Continue  PPI infusion/octreotide for total of 72 hours, completing tomorrow.  Rocephin 2g daily, need to complete 5 days of antibiotics.  Soft diet.  Restart Coreg as outpatient once BPs improve.  Repeat EGD in 4 weeks. Complete etoh cessation advised. Correct electrolyte abnormalities per attending. Diuretics on hold.  Titrate lactulose to 3 soft BMs daily.    LOS: 2 days   Leanna Battles. Dixon Boos [facility] [phone] 8/22/20249:48 AM   Addendum: Total bilirubin down to 9.3 from 11.6.  INR down to 2 from 2.3.  Discussed possible prednisolone with Dr. Tasia Catchings.  Given improvement in total bilirubin and INR, patient's history of noncompliance with steroids in the past, would hold off on use of prednisolone.  Patient has significant hypokalemia.  Will check magnesium level.  Leanna Battles. Dixon Boos [facility] [phone] 8/22/20242:15 PM

## 2023-02-24 NOTE — Plan of Care (Signed)

## 2023-02-24 NOTE — Inpatient Diabetes Management (Addendum)
Inpatient Diabetes Program Recommendations  AACE/ADA: New Consensus Statement on Inpatient Glycemic Control (2015)  Target Ranges:  Prepandial:   less than 140 mg/dL      Peak postprandial:   less than 180 mg/dL (1-2 hours)      Critically ill patients:  140 - 180 mg/dL    Latest Reference Range & Units 02/22/23 09:05  Hemoglobin A1C 4.8 - 5.6 % 6.7 (H)  (H): Data is abnormally high  Latest Reference Range & Units 02/23/23 08:07 02/23/23 11:33 02/23/23 16:40 02/23/23 20:33  Glucose-Capillary 70 - 99 mg/dL 77 161 (H)  2 units Novolog  205 (H)  3 units Novolog  244 (H)  2 units Novolog  10 units Semglee    Latest Reference Range & Units 02/24/23 07:35 02/24/23 11:23  Glucose-Capillary 70 - 99 mg/dL 096 (H)  2 units Novolog  331 (H)  7 units Novolog   (H): Data is abnormally high   Admit with: Hematemesis/ GIB  History: DM, Alcoholic cirrhosis   Home DM Meds: Dexcom G7 CGM        Lantus 30 units at bedtime        Humalog 5-11 units TID         Current Orders: Semglee 10 units at bedtime     Novolog Sensitive Correction Scale/ SSI (0-9 units) TID AC + HS     MD- Note CBG 331 at Lunch today.    Getting FL diet + Ensure Enlive PO Supps BID between meals  Please Consider starting Novolog Meal Coverage: Novolog 4 units TID with meals HOLD if pt NPO HOLD if pt eats <50% meals    --Will follow patient during hospitalization--  Ambrose Finland RN, MSN, CDCES Diabetes Coordinator Inpatient Glycemic Control Team Team Pager: [phone] (8a-5p)

## 2023-02-24 NOTE — Progress Notes (Addendum)
Nutrition Follow-up  DOCUMENTATION CODES:   Not applicable  INTERVENTION:   -Continue MVI with minerals daily -D/c Boost Breeze -D/c Prosource Plus -Ensure Enlive po BID, each supplement provides 350 kcal and 20 grams of protein.  -Magic cup TID with meals, each supplement provides 290 kcal and 9 grams of protein   NUTRITION DIAGNOSIS:   Inadequate oral intake related to altered GI function as evidenced by NPO status.  Progressing; advanced to full liquid diet  GOAL:   Patient will meet greater than or equal to 90% of their needs  Progressing   MONITOR:   PO intake, Supplement acceptance, Diet advancement  REASON FOR ASSESSMENT:   Malnutrition Screening Tool    ASSESSMENT:   Pt with medical history significant of alcoholic cirrhosis, fatty liver, gastroesophageal reflux disease, hyperlipidemia, type 2 diabetes on insulin and esophageal varices; presented to the hospital secondary to nausea/vomiting and abdominal pain.  Patient's symptoms have been present for the last 2 days and worsening.  Reports initially having episodes of vomiting with subsequent hematemesis events (more than 15 episodes at home).  8/20- s/p Procedure(s): ESOPHAGOGASTRODUODENOSCOPY (EGD) WITH PROPOFOL (N/A) ESOPHAGEAL BANDING 8/21- s/p rt paracentesis (3.3 L removed)  Reviewed I/O's: +126 ml x 24 hours and +2.5 L since admission  UOP: 1.8 L x 24 hours   GI team considering TIPS procedure. No recommendations for [facility] secondary to lack of PV thrombus.   Spoke with pt at bedside, who was pleasant and in good spirits today. He reports feeling a little better. He experienced a general decline in health since November 2013; he has been in the hospital x 4 since that time period for similar hospital complaints. Pt reports that he has become more weak since January and has moved out of him home to live with his girlfriend for additional support. Pt reports he has had to increasingly rely on her since  January. Pt reports intake is variable depending on how he feels. Sometimes he is able to consume solid foods (one meal per day) and every other day he feels so poorly that he is able to consume liquids only. This has worsened since March, which was the last time he was in the hospital. He is currently on clear liquid diet, which he is tolerating well, but reports he is ready to try some solid foods as he is hungry and feels able to tolerate it (noted diet advanced to full liquids after RD visit).   Pt shares that his UBW is around 200# and has lost about 40# since January. Reviewed wt hx; pt has experienced a 6.8% wt loss over the past 3 months, which is not significant for time frame. Suspect some wt loss related to paracentesis. Pt last had paracentesis several months ago.   Discussed importance of good meal and supplement intake to promote healing. Pt was drinking Premier Protein supplements at home. Discussed more appropriate supplements due to poor oral intake. Encouraged consuming small, frequent meals to assist with poor oral intake. Pt appreciative of visit.   Medications reviewed and include folic acid, lactulose, potassium chloride, and thiamine.   Labs reviewed: CBGS: 176-331 (inpatient orders for glycemic control are 0-5 units insulin aspart daily at bedtime, 0-9 units insulin aspart TID with meals, and 10 units insulin glarigne-yfgn daily at bedtime).    NUTRITION - FOCUSED PHYSICAL EXAM:  Flowsheet Row Most Recent Value  Orbital Region No depletion  Upper Arm Region No depletion  Thoracic and Lumbar Region No depletion  Buccal Region  No depletion  Temple Region No depletion  Clavicle Bone Region Mild depletion  Clavicle and Acromion Bone Region Mild depletion  Scapular Bone Region Mild depletion  Dorsal Hand No depletion  Patellar Region No depletion  Anterior Thigh Region No depletion  Posterior Calf Region No depletion  Edema (RD Assessment) Mild  Hair Reviewed  Eyes  Reviewed  Mouth Reviewed  Skin Reviewed  Nails Reviewed       Diet Order:   Diet Order             Diet full liquid Room service appropriate? Yes; Fluid consistency: Thin  Diet effective now                   EDUCATION NEEDS:   No education needs have been identified at this time  Skin:  Skin Assessment: Reviewed RN Assessment  Last BM:  Unknown  Height:   Ht Readings from Last 1 Encounters:  02/22/23 5\' 6"  (1.676 m)    Weight:   Wt Readings from Last 1 Encounters:  02/22/23 78.4 kg    Ideal Body Weight:  59.1 kg  BMI:  Body mass index is 27.9 kg/m.  Estimated Nutritional Needs:   Kcal:  1750-1950  Protein:  90-105 grams  Fluid:  > 1.7 L    Levada Schilling, RD, LDN, CDCES Registered Dietitian II Certified Diabetes Care and Education Specialist Please refer to [facility] for RD and/or RD on-call/weekend/after hours pager

## 2023-02-24 NOTE — Progress Notes (Signed)
Progress Note   Patient: Shawn Velazquez DOB: [birth year] DOA: 02/22/2023     2 DOS: the patient was seen and examined on 02/24/2023   Brief hospital admission narrative: Shawn Velazquez is a 57 y.o. male with medical history significant of alcoholic cirrhosis, fatty liver, gastroesophageal reflux disease, hyperlipidemia, type 2 diabetes on insulin and esophageal varices; presented to the hospital secondary to nausea/vomiting and abdominal pain.  Patient's symptoms have been present for the last 2 days and worsening.  Reports initially having episodes of vomiting with subsequent hematemesis events (more than 15 episodes at home).   Patient reports noticing 1 episode of melena on the day of admission.   Patient reports no chest pain, no dysuria/hematuria, focal weaknesses, sick contacts or any other complaints.   Workup in the ED demonstrating decrease of patient's hemoglobin level from baseline by 2 g; male 3.0 score 29 at time of admission.  Elevated bilirubin levels and complaints of feeling dizzy/slightly lightheaded.  GI service consulted with plan for endoscopic evaluation and TRH has been called to place patient in the hospital for further evaluation and management.  Assessment and Plan: * GI bleed -In the setting of esophageal varices bleed due to alcoholic cirrhosis with history of ongoing alcohol use. -Status post endoscopic evaluation and ligation/banding. -Continue IV octreotide and PPI -Will continue to follow hemoglobin trend and transfuse as needed for level less than 7. -Blood pressure is soft but stable; patient in no acute distress. -Continue to follow GI service recommendation.  ABLA (acute blood loss anemia) -Patient with esophageal varices in the setting of alcoholic cirrhosis -Status post banding by GI service -Hemoglobin down to 8.2 from around 12.5 baseline -Blood pressure soft but is stable; patient reports no shortness of breath, chest pain or  acute signs of instability. -Continue IV PPI and IV octreotide x 3 days -Continue treatment with IV antibiotics for SBP prophylaxis x 5 days -Continue to follow GI service recommendations. -Will advance diet to full liquids; further diet advancements as per GI service recommendation.  Alcoholic cirrhosis of liver with ascites (HCC) -No major ascites appreciated on exam  -Complete alcohol cessation discussed with patient -Continue PPI, lactulose and rifaximin -Continue holding diuretics and Coreg in the setting of acute bleeding and soft blood pressure. -Continue lactulose with intention to achieve 3 bowel movements daily. -Evaluation for potential TIPS down the road initiated following recommendations by IR. -IR will arranged outpatient follow-up in the next 4-6 weeks after discharge.  Portal vein thrombosis -Following GI service recommendation IR service has been consulted for potential thrombectomy -From esophageal varices. -Per IR will check CTA BRTO and Echo -Continue supportive care. -Follow hemoglobin trend.  Elevated bilirubin -Abdominal ultrasound has been ordered by GI demonstrating portal vein thrombosis and cirrhotic changes.  There is no cholelithiasis or seen obstruction.  -No signs of cholecystitis on ultrasound.  Positive gallbladder sludge. -Patient's DF trending down and for now holding on prednisolone therapy. -CT angiogram BRTO demonstrated no portal vein thrombosis.  Diabetes mellitus (HCC) -A1c 6.7 -Continue to follow CBG fluctuation and further adjust hypoglycemic regimen. -Continue sliding scale insulin   Hypokalemia -As result of GI losses, home use of diuretics and decreased oral intake -Continue to follow electrolytes trend and further replete as needed. -Magnesium within normal limits.  Alcohol abuse -Cessation counseling provided -No acute withdrawal symptoms appreciated -Continue CIWA protocol monitoring -Continue thiamine and folic  acid. -Patient DEF, INR and bilirubin trending down.  Per GI service recommendation continue to hold on  prednisolone therapy.  Hyperlipidemia -No actively taking statin -Heart healthy diet discussed with patient.  Hyponatremia: -Chronic in the setting of cirrhosis -Continue to closely follow electrolyte -Continue to hold diuretics for now.  Macrocytosis: -In the setting of alcohol abuse -MCV 105.4  Subjective:  No acute withdrawal symptoms; no chest pain, no nausea, no vomiting.  Following commands appropriately and expressing no acute distress.  Patient reports no overt bleeding overnight and so far has tolerated clear liquid diet without problems. Physical Exam: Vitals:   02/23/23 1356 02/23/23 1918 02/24/23 0528 02/24/23 1420  BP: 95/62 (!) 91/53 96/63 114/75  Pulse: 80 86 86 78  Resp: 17 17  16   Temp: 98.4 F (36.9 C) 97.8 F (36.6 C) 98 F (36.7 C)   TempSrc:  Oral Oral   SpO2: 97% 94% 91% 97%  Weight:      Height:       General exam: Alert, awake, oriented x 3; following commands appropriately.  Reporting no operative bleeding and expressing no nausea or vomiting.  So far has tolerated clear liquid diet.  Positive icterus on exam. Respiratory system: Clear to auscultation. Respiratory effort normal.  Good saturation on room air. Cardiovascular system:RRR. No rubs or gallops; no JVD. Gastrointestinal system: Abdomen is slightly distended, nontender, positive bowel sounds, no guarding. Central nervous system: Alert and oriented. No focal neurological deficits. Extremities: No cyanosis or clubbing. Skin: No petechiae. Psychiatry: Judgement and insight appear normal. Mood & affect appropriate.    Data Reviewed: CBC: WBC 7.0, hemoglobin 9.2 and platelet count 71K -MCV 105.4 Basic metabolic panel: Sodium 129, potassium 2.6, chloride 93, bicarb 27, BUN 11, creatinine 0.76 and GFR > 60  Family Communication: No family at bedside.  Disposition: Status is:  Inpatient Remains inpatient appropriate because: Continue IV octreotide and IV PPI.  Continue to follow hemoglobin trend and transfuse for hemoglobin less than 7 as needed.   Planned Discharge Destination: Home   Time spent: 50 minutes  Author: Vassie Loll, MD 02/24/2023 5:15 PM  For on call review [email or web address].

## 2023-02-24 NOTE — TOC Progression Note (Addendum)
Transition of Care [facility]) - Progression Note    Patient Details  Name: Shawn Velazquez MRN: [id number] Date of Birth: 09-25-1965  Transition of Care [facility]) CM/SW Contact  Annice Needy, LCSW Phone Number: 02/24/2023, 2:18 PM  Clinical Narrative:    Additional SA resources emailed to [facility] from [employer] added to AVS. Patient advised.    Expected Discharge Plan: Home/Self Care Barriers to Discharge: Continued Medical Work up  Expected Discharge Plan and Services       Living arrangements for the past 2 months: Single Family Home                                       Social Determinants of Health (SDOH) Interventions SDOH Screenings   Food Insecurity: Low Risk  (01/18/2023)   Received from [facility]  Housing: Low Risk  (12/08/2022)  Utilities: Low Risk  (01/18/2023)   Received from [facility]  Depression (PHQ2-9): High Risk (10/07/2022)  Financial Resource Strain: Medium Risk (10/07/2022)  Stress: Stress Concern Present (10/07/2022)  Tobacco Use: High Risk (02/23/2023)    Readmission Risk Interventions    09/27/2022   11:21 AM 08/26/2022   11:17 AM  Readmission Risk Prevention Plan  Transportation Screening Complete Complete  Home Care Screening  Complete  Medication Review (RN CM)  Complete  HRI or Home Care Consult Complete   Social Work Consult for Recovery Care Planning/Counseling Complete   Palliative Care Screening Not Applicable   Medication Review [employer]) Complete

## 2023-02-25 DIAGNOSIS — K922 Gastrointestinal hemorrhage, unspecified: Secondary | ICD-10-CM

## 2023-02-25 DIAGNOSIS — R188 Other ascites: Secondary | ICD-10-CM | POA: Diagnosis not present

## 2023-02-25 DIAGNOSIS — F1010 Alcohol abuse, uncomplicated: Secondary | ICD-10-CM | POA: Diagnosis not present

## 2023-02-25 DIAGNOSIS — K7460 Unspecified cirrhosis of liver: Secondary | ICD-10-CM

## 2023-02-25 DIAGNOSIS — K7290 Hepatic failure, unspecified without coma: Secondary | ICD-10-CM

## 2023-02-25 DIAGNOSIS — I8511 Secondary esophageal varices with bleeding: Secondary | ICD-10-CM | POA: Diagnosis not present

## 2023-02-25 LAB — COMPREHENSIVE METABOLIC PANEL
ALT: 44 U/L (ref 0–44)
AST: 136 U/L — ABNORMAL HIGH (ref 15–41)
Albumin: 2.6 g/dL — ABNORMAL LOW (ref 3.5–5.0)
Alkaline Phosphatase: 152 U/L — ABNORMAL HIGH (ref 38–126)
Anion gap: 6 (ref 5–15)
BUN: 8 mg/dL (ref 6–20)
CO2: 28 mmol/L (ref 22–32)
Calcium: 8.3 mg/dL — ABNORMAL LOW (ref 8.9–10.3)
Chloride: 97 mmol/L — ABNORMAL LOW (ref 98–111)
Creatinine, Ser: 0.8 mg/dL (ref 0.61–1.24)
GFR, Estimated: >60 mL/min (ref >60)
Glucose, Bld: 226 mg/dL — ABNORMAL HIGH (ref 70–99)
Potassium: 3.6 mmol/L (ref 3.5–5.1)
Sodium: 131 mmol/L — ABNORMAL LOW (ref 135–145)
Total Bilirubin: 8.5 mg/dL — ABNORMAL HIGH (ref 0.3–1.2)
Total Protein: 5.9 g/dL — ABNORMAL LOW (ref 6.5–8.1)

## 2023-02-25 LAB — CBC
HCT: 29.6 % — ABNORMAL LOW (ref 39.0–52.0)
Hemoglobin: 10 g/dL — ABNORMAL LOW (ref 13.0–17.0)
MCH: 37 pg — ABNORMAL HIGH (ref 26.0–34.0)
MCHC: 33.8 g/dL (ref 30.0–36.0)
MCV: 109.6 fL — ABNORMAL HIGH (ref 80.0–100.0)
Platelets: 84 10*3/uL — ABNORMAL LOW (ref 150–400)
RBC: 2.7 MIL/uL — ABNORMAL LOW (ref 4.22–5.81)
RDW: 15.8 % — ABNORMAL HIGH (ref 11.5–15.5)
WBC: 8.7 10*3/uL (ref 4.0–10.5)
nRBC: 0 % (ref 0.0–0.2)

## 2023-02-25 LAB — PHOSPHORUS: Phosphorus: 2.1 mg/dL — ABNORMAL LOW (ref 2.5–4.6)

## 2023-02-25 LAB — PROTIME-INR
INR: 1.8 — ABNORMAL HIGH (ref 0.8–1.2)
Prothrombin Time: 21.4 s — ABNORMAL HIGH (ref 11.4–15.2)

## 2023-02-25 LAB — PATHOLOGIST SMEAR REVIEW

## 2023-02-25 LAB — MAGNESIUM: Magnesium: 1.7 mg/dL (ref 1.7–2.4)

## 2023-02-25 LAB — GLUCOSE, CAPILLARY
Glucose-Capillary: 153 mg/dL — ABNORMAL HIGH (ref 70–99)
Glucose-Capillary: 293 mg/dL — ABNORMAL HIGH (ref 70–99)
Glucose-Capillary: 288 mg/dL — ABNORMAL HIGH (ref 70–99)
Glucose-Capillary: 311 mg/dL — ABNORMAL HIGH (ref 70–99)

## 2023-02-25 MED FILL — Folic Acid Inj 5 MG/ML: INTRAMUSCULAR | Qty: 0.2 | Status: AC

## 2023-02-25 NOTE — Telephone Encounter (Signed)
Patient EGD while at the hospital for admission.  Please set him up for repeat EGD with Dr. Marletta Lor in 4 weeks.  You can use same instructions/medication adjustments as given in the office on Monday 8/19.   Brooke Bonito, MSN, APRN, FNP-BC, AGACNP-BC [facility] at [facility]

## 2023-02-25 NOTE — Progress Notes (Signed)
Progress Note   Patient: Shawn Velazquez WNU:[id number] DOB: 11/12/1965 DOA: 02/22/2023     3 DOS: the patient was seen and examined on 02/25/2023   Brief hospital admission narrative: Shawn Velazquez is a 57 y.o. male with medical history significant of alcoholic cirrhosis, fatty liver, gastroesophageal reflux disease, hyperlipidemia, type 2 diabetes on insulin and esophageal varices; presented to the hospital secondary to nausea/vomiting and abdominal pain.  Patient's symptoms have been present for the last 2 days and worsening.  Reports initially having episodes of vomiting with subsequent hematemesis events (more than 15 episodes at home).   Patient reports noticing 1 episode of melena on the day of admission.   Patient reports no chest pain, no dysuria/hematuria, focal weaknesses, sick contacts or any other complaints.   Workup in the ED demonstrating decrease of patient's hemoglobin level from baseline by 2 g; male 3.0 score 29 at time of admission.  Elevated bilirubin levels and complaints of feeling dizzy/slightly lightheaded.  GI service consulted with plan for endoscopic evaluation and TRH has been called to place patient in the hospital for further evaluation and management.  Assessment and Plan: * GI bleed -In the setting of esophageal varices bleed due to alcoholic cirrhosis with history of ongoing alcohol use. -Status post endoscopic evaluation and ligation/banding. -Continue IV octreotide and PPI -Will continue to follow hemoglobin trend and transfuse as needed for level less than 7. -Blood pressure is soft but stable; patient in no acute distress. -Continue to follow GI service recommendation.  ABLA (acute blood loss anemia) -Patient with esophageal varices in the setting of alcoholic cirrhosis -Status post banding by GI service -Hemoglobin down to 8.2 from around 12.5 baseline -Blood pressure soft but is stable; patient reports no shortness of breath, chest pain or  acute signs of instability. -Continue IV PPI and IV octreotide x 3 days -Continue treatment with IV antibiotics for SBP prophylaxis x 5 days -Continue to follow GI service recommendations. -Will advance diet to full liquids; further diet advancements as per GI service recommendation.  Alcoholic cirrhosis of liver with ascites (HCC) -No major ascites appreciated on exam  -Complete alcohol cessation discussed with patient -Continue PPI, lactulose and rifaximin -Continue holding diuretics and Coreg in the setting of acute bleeding and soft blood pressure. -Continue lactulose with intention to achieve 3 bowel movements daily. -Evaluation for potential TIPS down the road initiated following recommendations by IR. -IR will arranged outpatient follow-up in the next 4-6 weeks after discharge.  Portal vein thrombosis -Following GI service recommendation IR service has been consulted for potential thrombectomy -From esophageal varices. -CTA has demonstrated no thrombus in his portal vein. -Continue supportive care. -Follow hemoglobin trend. -IR service will follow patient up in about 4-6 weeks to determine TIPS timing.  Elevated bilirubin -Abdominal ultrasound has been ordered by GI demonstrating portal vein thrombosis and cirrhotic changes.  There is no cholelithiasis or seen obstruction.  -No signs of cholecystitis on ultrasound.  Positive gallbladder sludge. -Patient's DF trending down and for now holding on prednisolone therapy. -CT angiogram BRTO demonstrated no portal vein thrombosis. -Bilirubin level trending down; 8.5 currently.  Diabetes mellitus (HCC) -A1c 6.7 -Continue to follow CBG fluctuation and further adjust hypoglycemic regimen. -Continue sliding scale insulin   Hypokalemia -As result of GI losses, home use of diuretics and decreased oral intake -Continue to follow electrolytes trend and further replete as needed. -Magnesium within normal limits.  Alcohol  abuse -Cessation counseling provided -No acute withdrawal symptoms appreciated -Continue CIWA protocol monitoring -  Continue thiamine and folic acid. -Patient DEF, INR and bilirubin trending down.  Per GI service recommendation continue to hold on prednisolone therapy.  Hyperlipidemia -No actively taking statin -Heart healthy diet discussed with patient.  Hyponatremia: -Chronic in the setting of cirrhosis -Continue to closely follow electrolyte trend. -Continue to hold diuretics for now.  Macrocytosis: -In the setting of alcohol abuse -MCV 105.4 -Absolute alcohol cessation has been discussed with patient.  Subjective:  No overnight events; no acute withdrawal symptoms.  Patient denies chest pain, shortness of breath, nausea, vomiting, abdominal pain and overt bleeding.  He is feeling hungry and would like to have diet advance.  Physical Exam: Vitals:   02/24/23 1420 02/24/23 2038 02/25/23 0314 02/25/23 1332  BP: 114/75 (!) 99/56 121/87 115/61  Pulse: 78 82 93 94  Resp: 16  18   Temp:  98.1 F (36.7 C) 98.4 F (36.9 C) 98.3 F (36.8 C)  TempSrc:  Oral  Oral  SpO2: 97% 93% 97% 90%  Weight:      Height:       General exam: Alert, awake, oriented x 3; in no acute distress.  Reports feeling much better, expressing to be hungry, no overt bleeding.  Positive icterus (improving). Respiratory system: Clear to auscultation. Respiratory effort normal.  Good saturation on room air. Cardiovascular system:RRR. No rub or gallops. Gastrointestinal system: Abdomen is distended, soft, nontender, positive bowel sounds.  No significant ascites on examination. Central nervous system: Alert and oriented. No focal neurological deficits. Extremities: No cyanosis or clubbing. Skin: No petechiae. Psychiatry: Judgement and insight appear normal. Mood & affect appropriate.   Data Reviewed: Comprehensive metabolic panel: Sodium 131, potassium 3.6, chloride 97, bicarb 28, BUN 8, creatinine 0.8, AST  136, ALT 44, alkaline phosphatase 152, total bilirubin 8.5 and GFR >60 ZOX:WRUEA blood cells 8.7, hemoglobin 10.0 and count 84K Protime-INR: 21.4-1.8 respectively Magnesium 1.7 Phosphorus 2.1  Family Communication: No family at bedside.  Disposition: Status is: Inpatient Remains inpatient appropriate because: Continue IV octreotide and IV PPI.  Continue to follow hemoglobin trend and transfuse for hemoglobin less than 7 as needed.   Planned Discharge Destination: Home   Time spent: 50 minutes  Author: Vassie Loll, MD 02/25/2023 3:42 PM  For on call review [email or web address].

## 2023-02-25 NOTE — Progress Notes (Signed)
When patient went to restroom , IV to Right ac became dislodged and was bleeding , removed IV he still has 2 active IV sites.

## 2023-02-25 NOTE — Anesthesia Postprocedure Evaluation (Signed)
Anesthesia Post Note  Patient: DREUX KARLOVICH  Procedure(s) Performed: ESOPHAGOGASTRODUODENOSCOPY (EGD) WITH PROPOFOL ESOPHAGEAL BANDING  Patient location during evaluation: Phase II Anesthesia Type: General Level of consciousness: awake Pain management: pain level controlled Vital Signs Assessment: post-procedure vital signs reviewed and stable Respiratory status: spontaneous breathing and respiratory function stable Cardiovascular status: blood pressure returned to baseline and stable Postop Assessment: no headache and no apparent nausea or vomiting Anesthetic complications: no Comments: Late entry   No notable events documented.   Last Vitals:  Vitals:   02/24/23 2038 02/25/23 0314  BP: (!) 99/56 121/87  Pulse: 82 93  Resp:  18  Temp: 36.7 C 36.9 C  SpO2: 93% 97%    Last Pain:  Vitals:   02/25/23 0900  TempSrc:   PainSc: 0-No pain                 Windell [place]

## 2023-02-25 NOTE — Progress Notes (Addendum)
Gastroenterology Progress Note   Referring Provider: No ref. provider found Primary Care Physician:  [name], MD Primary Gastroenterologist:  Dr. Marletta Lor  Patient ID: Shawn Velazquez; [id number]; 03-07-1966    Subjective   Denies any nausea, vomiting, or abdominal pain.  Denies any overt confusion.  Continues to have black/tarry stools.  Had 1 bowel movement this morning and 2 yesterday.  He reports his understanding of the importance of complete alcohol cessation going forward as well as tobacco cessation in order to prevent further bleeding and to work toward transplant evaluation.  He would like to try to eat some more solid food today.   Objective   Vital signs in last 24 hours Temp:  [98.1 F (36.7 C)-98.4 F (36.9 C)] 98.4 F (36.9 C) (08/23 0314) Pulse Rate:  [78-93] 93 (08/23 0314) Resp:  [16-18] 18 (08/23 0314) BP: (99-121)/(56-87) 121/87 (08/23 0314) SpO2:  [93 %-97 %] 97 % (08/23 0314) Last BM Date : 02/23/23  Physical Exam General:   Alert and oriented, pleasant Head:  Normocephalic and atraumatic. Eyes: Mild scleral icterus. Mouth:  Without lesions, mucosa pink and moist.  Neck:  Supple, without thyromegaly or masses.  Heart:  S1, S2 present, no murmurs noted.  Lungs: Clear to auscultation bilaterally, without wheezing, rales, or rhonchi.  Abdomen:  Bowel sounds present, soft, non-tender. Mild distention. No HSM or hernias noted. No rebound or guarding. No masses appreciated  Msk:  Symmetrical without gross deformities. Normal posture. Extremities:  Without clubbing or edema. Neurologic:  Alert and  oriented x4;  grossly normal neurologically. Skin:  Warm and dry, intact without significant lesions.  Psych:  Alert and cooperative. Normal mood and affect.  Intake/Output from previous day: 08/22 0701 - 08/23 0700 In: 1466.2 [P.O.:960; I.V.:306.2; IV Piggyback:200] Out: 900 [Urine:900] Intake/Output this shift: Total I/O In: 240 [P.O.:240] Out: -    Lab Results  Recent Labs    02/23/23 1828 02/24/23 0404 02/25/23 0414  WBC 4.5 7.0 8.7  HGB 8.5* 8.2* 10.0*  HCT 22.9* 23.4* 29.6*  PLT 65* 71* 84*   BMET Recent Labs    02/23/23 1300 02/24/23 0404 02/25/23 0414  NA 129* 129* 131*  K 2.9* 2.6* 3.6  CL 89* 93* 97*  CO2 28 27 28   GLUCOSE 292* 247* 226*  BUN 17 11 8   CREATININE 0.82 0.76 0.80  CALCIUM 7.7* 7.8* 8.3*   LFT Recent Labs    02/23/23 1300 02/24/23 1152 02/25/23 0414  PROT 5.2* 5.1* 5.9*  ALBUMIN 2.4* 2.2* 2.6*  AST 98* 113* 136*  ALT 31 36 44  ALKPHOS 127* 120 152*  BILITOT 11.6* 9.3* 8.5*  BILIDIR  --  3.3*  --   IBILI  --  6.0*  --    PT/INR Recent Labs    02/24/23 1152 02/25/23 0414  LABPROT 22.5* 21.4*  INR 2.0* 1.8*   Hepatitis Panel No results for input(s): "HEPBSAG", "HCVAB", "HEPAIGM", "HEPBIGM" in the last 72 hours.  Studies/Results CT ANGIO ABD/PELVIS BRTO  Result Date: 02/24/2023 CLINICAL DATA:  Hematemesis, portal vein thrombosis. History of cirrhosis and portal hypertension. EXAM: CTA ABDOMEN AND PELVIS WITHOUT AND WITH CONTRAST TECHNIQUE: Multidetector CT imaging of the abdomen and pelvis was performed using the standard protocol during bolus administration of intravenous contrast. Multiplanar reconstructed images and MIPs were obtained and reviewed to evaluate the vascular anatomy. RADIATION DOSE REDUCTION: This exam was performed according to the departmental dose-optimization program which includes automated exposure control, adjustment of the mA and/or  kV according to patient size and/or use of iterative reconstruction technique. CONTRAST:  OMNIPAQUE IOHEXOL 350 MG/ML SOLN COMPARISON:  MRI of the abdomen 08/03/2022 FINDINGS: VASCULAR Aorta: Normal caliber aorta without aneurysm, dissection, vasculitis or significant stenosis. Mild scattered atherosclerotic vascular calcifications. Celiac: Patent without evidence of aneurysm, dissection, vasculitis or significant stenosis.  SMA: Patent without evidence of aneurysm, dissection, vasculitis or significant stenosis. Renals: Both main renal arteries are patent without evidence of aneurysm, dissection, vasculitis, fibromuscular dysplasia or significant stenosis. Small accessory renal arteries are noted bilaterally. IMA: Patent without evidence of aneurysm, dissection, vasculitis or significant stenosis. Inflow: Patent without evidence of aneurysm, dissection, vasculitis or significant stenosis. Proximal Outflow: Bilateral common femoral and visualized portions of the superficial and profunda femoral arteries are patent without evidence of aneurysm, dissection, vasculitis or significant stenosis. Veins: The superior mesenteric vein, inferior mesenteric vein, main portal vein and intrahepatic right and left portal veins are all widely patent. Splenic vein is relatively small in caliber but remains patent. Significant hypertrophy of the left gastric vein resulting in gastroesophageal varices. Recanalized periumbilical vein. Review of the MIP images confirms the above findings. NON-VASCULAR Lower chest: Moderately large layering left pleural effusion. Associated atelectasis. Subpleural reticulation, and architectural distortion in the periphery of the visualized lungs consistent with pulmonary fibrosis/interstitial lung disease. The heart is normal in size. Esophageal varices are present. Hepatobiliary: Abnormal morphology of the liver. Hypertrophy of the left lobe and relative to the right with a micro nodular contour consistent with cirrhosis. No arterially enhancing mass. Small gallstones are present within the gallbladder lumen. No significant biliary ductal dilatation. Pancreas: Unremarkable. No pancreatic ductal dilatation or surrounding inflammatory changes. Spleen: Normal in size without focal abnormality. Adrenals/Urinary Tract: Unremarkable adrenal glands. No hydronephrosis, nephrolithiasis or enhancing renal mass. Stomach/Bowel: No  focal bowel wall thickening or evidence of obstruction. Lymphatic: No suspicious lymphadenopathy. Reproductive: Prostate is unremarkable. Other: Small volume ascites and mesenteric edema. Right inguinal hernia containing ascitic fluid and omental fat. Musculoskeletal: No acute fracture or aggressive appearing lytic or blastic osseous lesion. IMPRESSION: 1. Hepatic cirrhosis with evidence of portal hypertension (esophageal varices, small volume ascites and moderate left hepatic hydrothorax). 2. No evidence of portal venous thrombosis. 3. Hypertrophic left gastric vein and short gastric veins result in moderate esophageal varices. 4.  Aortic Atherosclerosis (ICD10-I70.0). 5. Right inguinal hernia containing omental fat and ascitic fluid. 6. Cholelithiasis. Electronically Signed   By: Malachy Moan M.D.   On: 02/24/2023 06:20   ECHOCARDIOGRAM COMPLETE  Result Date: 02/23/2023    ECHOCARDIOGRAM REPORT   Patient Name:   Shawn Velazquez Date of Exam: 02/23/2023 Medical Rec #:  [id number]         Height:       66.0 in Accession #:    [id number]        Weight:       172.8 lb Date of Birth:  01-28-1966        BSA:          1.880 m Patient Age:    56 years          BP:           95/54 mmHg Patient Gender: M                 HR:           71 bpm. [facility] Procedure: 2D Echo, Cardiac Doppler and Color Doppler Indications:    S/P TIPS (transjugular intrahepatic portosystemic shunt)                 [  [id number]]  History:        Patient has prior history of Echocardiogram examinations, most                 recent 08/03/2022. Risk Factors:Hypertension, Diabetes and                 Dyslipidemia. Hx of Substance and Alcohol abuse.  Sonographer:    Celesta Gentile RCS Referring Phys: [phone] CARLOS MADERA IMPRESSIONS  1. Left ventricular ejection fraction, by estimation, is 70 to 75%. The left ventricle has hyperdynamic function. The left ventricle has no regional wall motion abnormalities. There is mild concentric  left ventricular hypertrophy. Left ventricular diastolic parameters were normal.  2. Right ventricular systolic function is normal. The right ventricular size is normal. Tricuspid regurgitation signal is inadequate for assessing PA pressure.  3. Left atrial size was mildly dilated.  4. Apparent ascitic fluid visualized.  5. The mitral valve is grossly normal. Trivial mitral valve regurgitation.  6. The aortic valve is tricuspid. There is mild calcification of the aortic valve. Aortic valve regurgitation is not visualized. Aortic valve sclerosis is present, with no evidence of aortic valve stenosis.  7. The inferior vena cava is normal in size with greater than 50% respiratory variability, suggesting right atrial pressure of 3 mmHg. Comparison(s): Prior images reviewed side by side. LVEF vigorous at 70-75%. FINDINGS  Left Ventricle: Left ventricular ejection fraction, by estimation, is 70 to 75%. The left ventricle has hyperdynamic function. The left ventricle has no regional wall motion abnormalities. The left ventricular internal cavity size was normal in size. There is mild concentric left ventricular hypertrophy. Left ventricular diastolic parameters were normal. Right Ventricle: The right ventricular size is normal. No increase in right ventricular wall thickness. Right ventricular systolic function is normal. Tricuspid regurgitation signal is inadequate for assessing PA pressure. Left Atrium: Left atrial size was mildly dilated. Right Atrium: Right atrial size was normal in size. Pericardium: Apparent ascitic fluid visualized. There is no evidence of pericardial effusion. Presence of epicardial fat layer. Mitral Valve: The mitral valve is grossly normal. Trivial mitral valve regurgitation. Tricuspid Valve: The tricuspid valve is grossly normal. Tricuspid valve regurgitation is trivial. Aortic Valve: The aortic valve is tricuspid. There is mild calcification of the aortic valve. There is mild aortic valve  annular calcification. Aortic valve regurgitation is not visualized. Aortic valve sclerosis is present, with no evidence of aortic valve stenosis. Pulmonic Valve: The pulmonic valve was grossly normal. Pulmonic valve regurgitation is trivial. Aorta: The aortic root is normal in size and structure. Venous: The inferior vena cava is normal in size with greater than 50% respiratory variability, suggesting right atrial pressure of 3 mmHg. IAS/Shunts: No atrial level shunt detected by color flow Doppler.  LEFT VENTRICLE PLAX 2D LVIDd:         4.70 cm   Diastology LVIDs:         2.50 cm   LV e' medial:    9.03 cm/s LV PW:         1.20 cm   LV E/e' medial:  10.0 LV IVS:        1.00 cm   LV e' lateral:   9.25 cm/s LVOT diam:     2.00 cm   LV E/e' lateral: 9.8 LV SV:         71 LV SV Index:   38 LVOT Area:     3.14 cm  RIGHT VENTRICLE RV S prime:     14.30  cm/s TAPSE (M-mode): 2.8 cm LEFT ATRIUM             Index        RIGHT ATRIUM           Index LA diam:        4.10 cm 2.18 cm/m   RA Area:     17.60 cm LA Vol (A2C):   56.0 ml 29.79 ml/m  RA Volume:   49.30 ml  26.23 ml/m LA Vol (A4C):   69.9 ml 37.19 ml/m LA Biplane Vol: 64.4 ml 34.26 ml/m  AORTIC VALVE LVOT Vmax:   109.00 cm/s LVOT Vmean:  68.500 cm/s LVOT VTI:    0.226 m  AORTA Ao Root diam: 3.40 cm MITRAL VALVE MV Area (PHT): 3.48 cm    SHUNTS MV Decel Time: 218 msec    Systemic VTI:  0.23 m MV E velocity: 90.40 cm/s  Systemic Diam: 2.00 cm MV A velocity: 84.00 cm/s MV E/A ratio:  1.08 Nona Dell MD Electronically signed by Nona Dell MD Signature Date/Time: 02/23/2023/5:26:08 PM    Final    US Paracentesis  Result Date: 02/23/2023 INDICATION: Patient with history of ETOH cirrhosis, recurrent ascites. Currently admitted for esophageal variceal bleeding. Request for diagnostic and therapeutic paracentesis. EXAM: ULTRASOUND GUIDED DIAGNOSTIC AND THERAPEUTIC PARACENTESIS MEDICATIONS: 7 mL 1% lidocaine. COMPLICATIONS: None immediate. PROCEDURE:  Informed written consent was obtained from the patient after a discussion of the risks, benefits and alternatives to treatment. A timeout was performed prior to the initiation of the procedure. Initial ultrasound scanning demonstrates a moderate amount of ascites within the right lower abdominal quadrant. The right lower abdomen was prepped and draped in the usual sterile fashion. 1% lidocaine was used for local anesthesia. Following this, a 19 gauge, 7-cm, Yueh catheter was introduced. An ultrasound image was saved for documentation purposes. The paracentesis was performed. The catheter was removed and a dressing was applied. The patient tolerated the procedure well without immediate post procedural complication. Patient received post-procedure intravenous albumin; see nursing notes for details. FINDINGS: A total of approximately 3.3 L of clear yellow fluid was removed. Samples were sent to the laboratory as requested by the clinical team. IMPRESSION: Successful ultrasound-guided paracentesis yielding 3.3 liters of peritoneal fluid. Performed by Lynnette Caffey, PA-C Electronically Signed   By: Acquanetta Belling M.D.   On: 02/23/2023 13:27   DG Chest Port 1 View  Result Date: 02/22/2023 CLINICAL DATA:  100030 Leukocytosis 100030 97749 Alcoholic hepatitis 97749 EXAM: PORTABLE CHEST 1 VIEW COMPARISON:  Chest x-ray 08/02/2022, CT chest 03/13/2014 FINDINGS: The heart and mediastinal contours are unchanged. Biapical pleural/pulmonary scarring. Cystic changes of lung apices. Question left base airspace opacity that may represent atelectasis. Chronic coarsened markings with no overt pulmonary edema. No pleural effusion. No pneumothorax. No acute osseous abnormality. IMPRESSION: 1. Question left base airspace opacity that may represent atelectasis or eventration of the hemidiaphragm. Recommend repeat PA and lateral view of the chest. 2.  Emphysema (ICD10-J43.9). Electronically Signed   By: Tish Frederickson M.D.   On:  02/22/2023 20:32   US Abdomen Limited RUQ (LIVER/GB)  Result Date: 02/22/2023 CLINICAL DATA:  Upper abdominal pain and nausea. EXAM: ULTRASOUND ABDOMEN LIMITED RIGHT UPPER QUADRANT COMPARISON:  August 02, 2022 FINDINGS: Gallbladder: Echogenic material is seen dependently within the gallbladder lumen. Gallbladder wall is thickened to 5.6 mm. No Murphy's sign. Common bile duct: Diameter: 4.5 mm. Liver: Nodular contour with increased coarse echogenicity. Severely diminished flow to the proximal portal vein with no  flow visualized to the distal main portal vein, right portal vein and left portal vein. Other: Moderate-sized ascites. IMPRESSION: Cirrhotic appearance of the liver with moderate ascites. Main portal vein thrombosis. Gallbladder sludge. Thickening of the gallbladder wall may be secondary to chronic liver disease/hypoalbuminemia. Acute cholecystitis in the absence of a sonographic Murphy's sign is less likely. These results will be called to the ordering clinician or representative by the Radiologist Assistant, and communication documented in the PACS or [employer]. Electronically Signed   By: Ted Mcalpine M.D.   On: 02/22/2023 12:29    Assessment  57 y.o. male with a history of alcoholic cirrhosis complicated by variceal bleeding, alcohol abuse, alcoholic hepatitis, and IDA following with hematology for IV iron infusions who presented to the ED with acute onset hematemesis and melena on 8/20.  GI consulted for further evaluation of upper GI bleeding and cirrhosis.  Alcoholic cirrhosis: Remains decompensated with history of esophageal variceal bleeding, ascites, coagulopathy.  MELD 3.0 29 yesterday, decreased to 25 today.  Presented with 1-2+ lower extremity edema despite given compliance with Lasix and spironolactone.  He did present with hyponatremia and hypokalemia therefore diuretics were placed on hold.  Underwent paracentesis Wednesday 8/21 with removal of 3.3 L without evidence of  SBP.  INR elevated at 2.3 therefore dose of vitamin K was given.  His ammonia level was 57 however out any overt signs of HE.  Currently not a liver transplant candidate despite multiple decompensation episodes given ongoing alcohol and tobacco use.  Elevated LFTs/hyperbilirubinemia/alcoholic hepatitis: As stated above patient with history of chronic alcohol abuse.  Earlier this week patient reported a few beers per week however discussions with patient's sister there is suspicion for increased alcohol use more than this.  DF greater than 32 this admission however given leukocytosis and history of noncompliance we are holding off on prednisolone.  Patient has plans to attempt inpatient rehab for alcohol cessation if able to get insurance coverage.    Evaluated by IR this admission as patient had RUQ [place] due to abdominal pain and vomiting which showed PVT.  IR recommended inpatient evaluation for TIPS and advised against thrombectomy and was recommending anticoagulation given his esophageal varices were banded this admission.  CT angio abdomen/pelvis BRTO this admission showed no evidence of PVT but with hypertrophic left gastric vein and short gastric veins resulting in moderate esophageal varices, cholelithiasis, hepatic cirrhosis with evidence of portal hypertension. DF on Wednesday 8/21 and 68.6 but has stated above was not started on steroids given leukocytosis and need to rule out underlying infection.  Currently without any leukocytosis.  Melena/hematemesis: On day of admission he had acute onset hematemesis and melena around 4 AM.  He reported 10-15 episodes of hematemesis and hemoglobin dropped to 11.5 from 13.1 (3 weeks prior).  He underwent EGD which revealed grade 2 esophageal varices with stigmata of recent bleeding s/p banding, portal hypertensive gastropathy, and no gastric varices.  He has been treated with PPI and octreotide infusion for 72 hours.  Will continue IV PPI twice daily while  inpatient and switch to p.o. outpatient and plan to repeat EGD in 4 weeks.  Given cirrhosis he will need to complete Rocephin for 5 days for SBP prophylaxis which he will complete tomorrow.  Plan / Recommendations  CBC, CMP, INR tomorrow IV PPI BID Rocephin 2 g daily, complete day 5 tomorrow 8/24 Soft diet, 2g sodium restriction Resume Coreg 3.125 mg twice daily along with home midodrine as long as BP stable on  discharge.  Complete alcohol cessation Continue to correct electrolytes Continue Xifaxan and lactulose. Increase lactulose to 20g TID with goal 3 BM daily., titrate as needed.   Repeat EGD in 4 weeks.   LOS: 3 days    02/25/2023, 10:30 AM   Brooke Bonito, MSN, FNP-BC, AGACNP-BC [facility]

## 2023-02-25 NOTE — Inpatient Diabetes Management (Signed)
Inpatient Diabetes Program Recommendations  AACE/ADA: New Consensus Statement on Inpatient Glycemic Control (2015)  Target Ranges:  Prepandial:   less than 140 mg/dL      Peak postprandial:   less than 180 mg/dL (1-2 hours)      Critically ill patients:  140 - 180 mg/dL    Latest Reference Range & Units 02/24/23 07:35 02/24/23 11:23 02/24/23 16:13 02/24/23 20:40  Glucose-Capillary 70 - 99 mg/dL 914 (H)  2 units Novolog  331 (H)  7 units Novolog  228 (H)  3 units Novolog  264 (H)  3 units Novolog  10 units Semglee  (H): Data is abnormally high  Latest Reference Range & Units 02/25/23 07:48  Glucose-Capillary 70 - 99 mg/dL 782 (H)  (H): Data is abnormally high   Home DM Meds: Dexcom G7 CGM                              Lantus 30 units at bedtime                              Humalog 5-11 units TID                               Current Orders: Semglee 10 units at bedtime                           Novolog Sensitive Correction Scale/ SSI (0-9 units) TID AC + HS    MD- Note CBGs elevated yest aftenroon   Getting FL diet + Ensure Enlive PO Supps BID between meals   Please Consider starting Novolog Meal Coverage: Novolog 6 units TID with meals HOLD if pt NPO HOLD if pt eats <50% meals    --Will follow patient during hospitalization--  Ambrose Finland RN, MSN, CDCES Diabetes Coordinator Inpatient Glycemic Control Team Team Pager: [phone] (8a-5p)

## 2023-02-25 NOTE — Plan of Care (Signed)

## 2023-02-26 ENCOUNTER — Inpatient Hospital Stay (HOSPITAL_COMMUNITY): Payer: BC Managed Care – PPO

## 2023-02-26 DIAGNOSIS — J441 Chronic obstructive pulmonary disease with (acute) exacerbation: Secondary | ICD-10-CM

## 2023-02-26 DIAGNOSIS — K7290 Hepatic failure, unspecified without coma: Secondary | ICD-10-CM | POA: Diagnosis not present

## 2023-02-26 DIAGNOSIS — K920 Hematemesis: Secondary | ICD-10-CM | POA: Diagnosis not present

## 2023-02-26 DIAGNOSIS — K922 Gastrointestinal hemorrhage, unspecified: Secondary | ICD-10-CM | POA: Diagnosis not present

## 2023-02-26 DIAGNOSIS — R188 Other ascites: Secondary | ICD-10-CM | POA: Diagnosis not present

## 2023-02-26 DIAGNOSIS — I8511 Secondary esophageal varices with bleeding: Secondary | ICD-10-CM | POA: Diagnosis not present

## 2023-02-26 LAB — COMPREHENSIVE METABOLIC PANEL
ALT: 42 U/L (ref 0–44)
AST: 117 U/L — ABNORMAL HIGH (ref 15–41)
Albumin: 2.3 g/dL — ABNORMAL LOW (ref 3.5–5.0)
Alkaline Phosphatase: 168 U/L — ABNORMAL HIGH (ref 38–126)
Anion gap: 8 (ref 5–15)
BUN: 6 mg/dL (ref 6–20)
CO2: 25 mmol/L (ref 22–32)
Calcium: 8.3 mg/dL — ABNORMAL LOW (ref 8.9–10.3)
Chloride: 97 mmol/L — ABNORMAL LOW (ref 98–111)
Creatinine, Ser: 0.72 mg/dL (ref 0.61–1.24)
GFR, Estimated: >60 mL/min (ref >60)
Glucose, Bld: 239 mg/dL — ABNORMAL HIGH (ref 70–99)
Potassium: 3.9 mmol/L (ref 3.5–5.1)
Sodium: 130 mmol/L — ABNORMAL LOW (ref 135–145)
Total Bilirubin: 5.9 mg/dL — ABNORMAL HIGH (ref 0.3–1.2)
Total Protein: 5.2 g/dL — ABNORMAL LOW (ref 6.5–8.1)

## 2023-02-26 LAB — CBC
HCT: 24.9 % — ABNORMAL LOW (ref 39.0–52.0)
Hemoglobin: 8.5 g/dL — ABNORMAL LOW (ref 13.0–17.0)
MCH: 37.1 pg — ABNORMAL HIGH (ref 26.0–34.0)
MCHC: 34.1 g/dL (ref 30.0–36.0)
MCV: 108.7 fL — ABNORMAL HIGH (ref 80.0–100.0)
Platelets: 77 10*3/uL — ABNORMAL LOW (ref 150–400)
RBC: 2.29 MIL/uL — ABNORMAL LOW (ref 4.22–5.81)
RDW: 15.9 % — ABNORMAL HIGH (ref 11.5–15.5)
WBC: 10.3 10*3/uL (ref 4.0–10.5)
nRBC: 0 % (ref 0.0–0.2)

## 2023-02-26 LAB — PHOSPHORUS: Phosphorus: 2.7 mg/dL (ref 2.5–4.6)

## 2023-02-26 LAB — GLUCOSE, CAPILLARY
Glucose-Capillary: 212 mg/dL — ABNORMAL HIGH (ref 70–99)
Glucose-Capillary: 308 mg/dL — ABNORMAL HIGH (ref 70–99)
Glucose-Capillary: 176 mg/dL — ABNORMAL HIGH (ref 70–99)
Glucose-Capillary: 267 mg/dL — ABNORMAL HIGH (ref 70–99)

## 2023-02-26 MED ORDER — FUROSEMIDE 20 MG PO TABS
20.0000 mg | ORAL_TABLET | Freq: Every day | ORAL | Status: DC
  Administered 2023-02-26 – 2023-02-28 (×3): 20 mg via ORAL
  Filled 2023-02-26 (×3): qty 1

## 2023-02-26 MED ORDER — INSULIN ASPART 100 UNIT/ML IJ SOLN
0.0000 [IU] | Freq: Three times a day (TID) | INTRAMUSCULAR | Status: DC
  Administered 2023-02-26: 11 [IU] via SUBCUTANEOUS
  Administered 2023-02-26: 3 [IU] via SUBCUTANEOUS

## 2023-02-26 MED ORDER — INSULIN GLARGINE-YFGN 100 UNIT/ML ~~LOC~~ SOLN
15.0000 [IU] | Freq: Every day | SUBCUTANEOUS | Status: DC
  Administered 2023-02-26: 15 [IU] via SUBCUTANEOUS
  Filled 2023-02-26 (×2): qty 0.15

## 2023-02-26 MED ORDER — METHYLPREDNISOLONE SODIUM SUCC 40 MG IJ SOLR
40.0000 mg | INTRAMUSCULAR | Status: DC
  Administered 2023-02-26 – 2023-02-27 (×2): 40 mg via INTRAVENOUS
  Filled 2023-02-26 (×2): qty 1

## 2023-02-26 MED ORDER — BUDESONIDE 0.25 MG/2ML IN SUSP
0.2500 mg | Freq: Two times a day (BID) | RESPIRATORY_TRACT | Status: DC
  Administered 2023-02-26 – 2023-02-28 (×4): 0.25 mg via RESPIRATORY_TRACT
  Filled 2023-02-26 (×4): qty 2

## 2023-02-26 MED ORDER — PANTOPRAZOLE SODIUM 40 MG PO TBEC
40.0000 mg | DELAYED_RELEASE_TABLET | Freq: Every day | ORAL | Status: DC
  Administered 2023-02-27 – 2023-02-28 (×2): 40 mg via ORAL
  Filled 2023-02-26 (×3): qty 1

## 2023-02-26 MED ORDER — POTASSIUM CHLORIDE CRYS ER 20 MEQ PO TBCR
40.0000 meq | EXTENDED_RELEASE_TABLET | ORAL | Status: AC
  Administered 2023-02-26 (×2): 40 meq via ORAL
  Filled 2023-02-26 (×2): qty 2

## 2023-02-26 MED ORDER — SPIRONOLACTONE 25 MG PO TABS
50.0000 mg | ORAL_TABLET | Freq: Every day | ORAL | Status: DC
  Administered 2023-02-26 – 2023-02-28 (×3): 50 mg via ORAL
  Filled 2023-02-26 (×3): qty 2

## 2023-02-26 MED ORDER — ARFORMOTEROL TARTRATE 15 MCG/2ML IN NEBU
15.0000 ug | INHALATION_SOLUTION | Freq: Two times a day (BID) | RESPIRATORY_TRACT | Status: DC
  Administered 2023-02-26 – 2023-02-28 (×4): 15 ug via RESPIRATORY_TRACT
  Filled 2023-02-26 (×4): qty 2

## 2023-02-26 MED ORDER — INSULIN ASPART 100 UNIT/ML IJ SOLN
3.0000 [IU] | Freq: Three times a day (TID) | INTRAMUSCULAR | Status: DC
  Administered 2023-02-26: 3 [IU] via SUBCUTANEOUS

## 2023-02-26 MED ORDER — ALBUTEROL SULFATE (2.5 MG/3ML) 0.083% IN NEBU: 2.5000 mg | INHALATION_SOLUTION | RESPIRATORY_TRACT | Status: DC | PRN

## 2023-02-26 MED ORDER — INSULIN ASPART 100 UNIT/ML IJ SOLN
0.0000 [IU] | Freq: Every day | INTRAMUSCULAR | Status: DC
  Administered 2023-02-26: 3 [IU] via SUBCUTANEOUS

## 2023-02-26 MED ORDER — INSULIN ASPART 100 UNIT/ML IJ SOLN
5.0000 [IU] | Freq: Three times a day (TID) | INTRAMUSCULAR | Status: DC
  Administered 2023-02-26 – 2023-02-28 (×6): 5 [IU] via SUBCUTANEOUS

## 2023-02-26 MED ORDER — MAGNESIUM SULFATE 2 GM/50ML IV SOLN
2.0000 g | Freq: Once | INTRAVENOUS | Status: AC
  Administered 2023-02-26: 2 g via INTRAVENOUS
  Filled 2023-02-26: qty 50

## 2023-02-26 NOTE — Progress Notes (Signed)
Gastroenterology & Hepatology   Interval History: Patient reported shortness of breath last night and was started on steroids for it.  Reports having bowel movement which was brown in color  Inpatient Medications:  Current Facility-Administered Medications:    acetaminophen (TYLENOL) tablet 650 mg, 650 mg, Oral, Q6H PRN, 650 mg at 02/24/23 0801 **OR** acetaminophen (TYLENOL) suppository 650 mg, 650 mg, Rectal, Q6H PRN, Vassie Loll, MD   albuterol (PROVENTIL) (2.5 MG/3ML) 0.083% nebulizer solution 2.5 mg, 2.5 mg, Nebulization, Q4H PRN, Hongalgi, Maximino Greenland, MD   arformoterol (BROVANA) nebulizer solution 15 mcg, 15 mcg, Nebulization, BID, Hongalgi, Anand D, MD   budesonide (PULMICORT) nebulizer solution 0.25 mg, 0.25 mg, Nebulization, BID, Hongalgi, Maximino Greenland, MD   busPIRone (BUSPAR) tablet 5 mg, 5 mg, Oral, BID, Vassie Loll, MD, 5 mg at 02/26/23 3329   feeding supplement (ENSURE ENLIVE / ENSURE PLUS) liquid 237 mL, 237 mL, Oral, BID BM, Vassie Loll, MD, 237 mL at 02/26/23 0827   FLUoxetine (PROZAC) capsule 20 mg, 20 mg, Oral, BID, Vassie Loll, MD, 20 mg at 02/26/23 5188   folic acid injection 1 mg, 1 mg, Intravenous, Daily, Vassie Loll, MD, 1 mg at 02/26/23 1048   HYDROmorphone (DILAUDID) injection 0.5 mg, 0.5 mg, Intravenous, Q3H PRN, Vassie Loll, MD   insulin aspart (novoLOG) injection 0-15 Units, 0-15 Units, Subcutaneous, TID WC, Hongalgi, Anand D, MD   insulin aspart (novoLOG) injection 0-5 Units, 0-5 Units, Subcutaneous, QHS, Hongalgi, Anand D, MD   insulin aspart (novoLOG) injection 3 Units, 3 Units, Subcutaneous, TID WC, Hongalgi, Anand D, MD   insulin glargine-yfgn (SEMGLEE) injection 15 Units, 15 Units, Subcutaneous, QHS, Hongalgi, Anand D, MD   lactulose (CHRONULAC) 10 GM/15ML solution 20 g, 20 g, Oral, BID, Tiffany Kocher, PA-C, 20 g at 02/26/23 4166   methylPREDNISolone sodium succinate (SOLU-MEDROL) 40 mg/mL injection 40 mg, 40 mg, Intravenous, Q24H, Hongalgi, Anand D,  MD   midodrine (PROAMATINE) tablet 5 mg, 5 mg, Oral, TID WC, Vassie Loll, MD, 5 mg at 02/26/23 0630   multivitamin with minerals tablet 1 tablet, 1 tablet, Oral, Daily, Vassie Loll, MD, 1 tablet at 02/26/23 1601   pantoprazole (PROTONIX) injection 40 mg, 40 mg, Intravenous, Q12H, Bethann Berkshire, MD, 40 mg at 02/26/23 0932   potassium chloride 10 mEq in 100 mL IVPB, 10 mEq, Intravenous, Once, Bethann Berkshire, MD   prochlorperazine (COMPAZINE) injection 10 mg, 10 mg, Intravenous, Q6H PRN, Clearance Coots, Kristen S, PA-C   rifaximin (XIFAXAN) tablet 550 mg, 550 mg, Oral, BID, Vassie Loll, MD, 550 mg at 02/26/23 3557   thiamine (VITAMIN B1) injection 100 mg, 100 mg, Intravenous, Daily, Vassie Loll, MD, 100 mg at 02/26/23 3220   I/O    Intake/Output Summary (Last 24 hours) at 02/26/2023 1206 Last data filed at 02/26/2023 1009 Gross per 24 hour  Intake 720 ml  Output --  Net 720 ml     Physical Exam: Temp:  [98.3 F (36.8 C)-99 F (37.2 C)] 99 F (37.2 C) (08/24 0459) Pulse Rate:  [94-99] 99 (08/24 0459) Resp:  [19] 19 (08/23 2037) BP: (111-123)/(61-85) 111/80 (08/24 0459) SpO2:  [90 %-96 %] 95 % (08/24 0459)  Temp (24hrs), Avg:98.6 F (37 C), Min:98.3 F (36.8 C), Max:99 F (37.2 C)  GENERAL: The patient is AO x3, in no acute distress. HEENT: Head is normocephalic and atraumatic. EOMI are intact. Mouth is well hydrated and without lesions. NECK: Supple. No masses LUNGS: Clear to auscultation. No presence of rhonchi/wheezing/rales. Adequate chest expansion HEART: RRR,  normal s1 and s2. ABDOMEN: Soft, nontender, no guarding, no peritoneal signs, and nondistended. BS +. No masses. EXTREMITIES: Without any cyanosis, clubbing, rash, lesions or edema. NEUROLOGIC: AOx3, no focal motor deficit. SKIN: no jaundice, no rashes  Laboratory Data: CBC:     Component Value Date/Time   WBC 10.3 02/26/2023 0546   RBC 2.29 (L) 02/26/2023 0546   HGB 8.5 (L) 02/26/2023 0546   HGB 14.4  07/31/2021 1204   HCT 24.9 (L) 02/26/2023 0546   HCT 42.4 07/31/2021 1204   PLT 77 (L) 02/26/2023 0546   PLT 146 (L) 07/31/2021 1204   MCV 108.7 (H) 02/26/2023 0546   MCV 97 07/31/2021 1204   MCH 37.1 (H) 02/26/2023 0546   MCHC 34.1 02/26/2023 0546   RDW 15.9 (H) 02/26/2023 0546   RDW 11.8 07/31/2021 1204   LYMPHSABS 2.0 02/22/2023 0905   LYMPHSABS 1.6 07/31/2021 1204   MONOABS 1.2 (H) 02/22/2023 0905   EOSABS 0.3 02/22/2023 0905   EOSABS 0.2 07/31/2021 1204   BASOSABS 0.1 02/22/2023 0905   BASOSABS 0.1 07/31/2021 1204   COAG:  Lab Results  Component Value Date   INR 1.8 (H) 02/25/2023   INR 2.0 (H) 02/24/2023   INR 2.3 (H) 02/23/2023    BMP:     Latest Ref Rng & Units 02/26/2023    5:46 AM 02/25/2023    4:14 AM 02/24/2023    4:04 AM  BMP  Glucose 70 - 99 mg/dL 295  188  416   BUN 6 - 20 mg/dL 6  8  11    Creatinine 0.61 - 1.24 mg/dL 6.06  3.01  6.01   Sodium 135 - 145 mmol/L 130  131  129   Potassium 3.5 - 5.1 mmol/L 3.9  3.6  2.6   Chloride 98 - 111 mmol/L 97  97  93   CO2 22 - 32 mmol/L 25  28  27    Calcium 8.9 - 10.3 mg/dL 8.3  8.3  7.8     HEPATIC:     Latest Ref Rng & Units 02/26/2023    5:46 AM 02/25/2023    4:14 AM 02/24/2023   11:52 AM  Hepatic Function  Total Protein 6.5 - 8.1 g/dL 5.2  5.9  5.1   Albumin 3.5 - 5.0 g/dL 2.3  2.6  2.2   AST 15 - 41 U/L 117  136  113   ALT 0 - 44 U/L 42  44  36   Alk Phosphatase 38 - 126 U/L 168  152  120   Total Bilirubin 0.3 - 1.2 mg/dL 5.9  8.5  9.3   Bilirubin, Direct 0.0 - 0.2 mg/dL   3.3     CARDIAC:  Lab Results  Component Value Date   CKTOTAL 185 03/06/2011   CKMB 4.1 (H) 03/06/2011   TROPONINI <0.30 04/20/2012      Imaging: I personally reviewed and interpreted the available labs, imaging and endoscopic files.   Assessment/Plan:  58 y.o. male with a history of alcoholic cirrhosis complicated by variceal bleeding, alcohol abuse, alcoholic hepatitis, and IDA following with hematology for IV iron infusions  who presented hematemesis and melena on 8/20 now s/p EVL .  GI is following the patient for decompensated cirrhosis with esophageal varices with bleeding status post banding 02/22/23 and alcoholic hepatitis.   #Decompensated cirrhosis #EV s/p ligation   MELD 3.0: 24 at 02/26/2023  5:46 AM MELD-Na: 25 at 02/26/2023  5:46 AM Calculated from: Serum Creatinine: 0.72 mg/dL (Using  min of 1 mg/dL) at 1/61/0960  4:54 AM Serum Sodium: 130 mmol/L at 02/26/2023  5:46 AM Total Bilirubin: 5.9 mg/dL at 0/98/1191  4:78 AM Serum Albumin: 2.3 g/dL at 2/95/6213  0:86 AM INR(ratio): 1.8 at 02/25/2023  4:14 AM Age at listing (hypothetical): 40 years Sex: Male at 02/26/2023  5:46 AM   Liver enzymes trending down including T.Bili Baseline HBG: 8.5  INR improved : 2---->1.8 K: 3.9 Mag: 1.7  Steroids started last night for AECOPD  Recs:  -Can start Lasix 20 and Spironolactone 50mg  as hydrothorax may be playing a role in shortness of breath -beta-blockers with midodrine  -Titrate lactulose to 2-3 bowel movements daily.  -completing Rocephin today.  -Change PPI to p.o.  -patient to follow-up in GI clinic to have repeat surveillance upper endoscopy for esophageal varices in 4 weeks.  -Keep K>4 and Mg>2   Vista Lawman, MD Gastroenterology and Hepatology [facility] Gastroenterology   This chart has been completed using [facility] Dictation software, and while attempts have been made to ensure accuracy , certain words and phrases may not be transcribed as intended

## 2023-02-26 NOTE — Progress Notes (Signed)
PROGRESS NOTE   Shawn Velazquez  ZOX:[id number]    DOB: Sep 16, 1965    DOA: 02/22/2023  PCP: [name], MD   I have briefly reviewed patients previous medical records in [facility]  Chief Complaint  Patient presents with   Hematemesis    Brief Narrative:  57 y.o. male with medical history significant of alcoholic cirrhosis with esophageal varices, fatty liver, gastroesophageal reflux disease, hyperlipidemia, type 2 diabetes on insulin, IDA followed by hematology for iron infusions, presented to the hospital secondary to nausea/vomiting and abdominal pain.  Patient's symptoms had been present for 2 days PTA and worsening.  Reports initially having episodes of vomiting with subsequent hematemesis events (more than 15 episodes at home).  He reported noticing 1 episode of melena on the day of admission.  Workup in the ED demonstrating decrease of patient's hemoglobin level from baseline by 2 g; MELD 3 score 29 at time of admission.  GI service consulted, s/p EGD and esophageal variceal banding 8/20.  Course complicated by COPD exacerbation.   Assessment & Plan:  Principal Problem:   GI bleed Active Problems:   Alcoholic cirrhosis of liver with ascites (HCC)   ABLA (acute blood loss anemia)   Hyperlipidemia   Alcohol abuse   Hypokalemia   Diabetes mellitus (HCC)   Elevated bilirubin   Esophageal varices with bleeding in diseases classified elsewhere [facility])   Portal vein thrombosis   Other ascites   Esophageal variceal bleeding/acute UGIB: Treated with bowel rest/n.p.o., IV octreotide drip, Protonix drip, IV vitamin K x 2, IV fluid resuscitation and IV ceftriaxone x 5 days for SBP prophylaxis. Serial CBCs were monitored but fortunately he has not required blood transfusions GI consulted, s/p EGD and esophageal variceal ligation and banding on 8/20 GI bleeding has stopped.  Hemoglobin stable. PPI switched to p.o., completes last day of ceftriaxone today.  Tolerating regular  consistency diet. Home dose of midodrine resumed, resume carvedilol at time of discharge 3.125 Mg twice daily. Per GI follow-up, follow-up in GI clinic to have repeat surveillance upper endoscopy for esophageal varices in 4 weeks  Decompensated cirrhosis with portal hypertension, esophageal varices, ascites, prior portal vein thrombosis (not seen on most recent imaging): GI follow-up appreciated Bilirubin trending down 3.3 L ultrasound-guided paracentesis by IR on 8/21 Resume diuretics at time of discharge, currently on hold Titrate lactulose to 2-3 BMs daily.  Remains on rifaximin. Completes Rocephin SBP prophylaxis today Continue PPI as noted above. Absolute alcohol cessation has been counseled. As per prior TR[facility] MD, even duration for potential TIPS down the road initiated following recommendations by IR.  IR will arrange for outpatient follow-up in the next 4 to 6 weeks after discharge  Portal vein thrombosis: As per GI service recommendation, IR was consulted for potential thrombectomy. However CTA demonstrated no thrombus in the portal vein.  Acute blood loss anemia Secondary to upper GI bleed Most recent baseline hemoglobin is in the 9-10 range.  Presented with hemoglobin of 11.6 which has gradually drifted down but stable in the low to mid 8 g range. Follow CBCs periodically.  Thrombocytopenia: Secondary to cirrhosis and alcohol use Relatively stable.  COPD exacerbation: Noted worsening dyspnea, chest congestion since 8/23 night No formal diagnosis of COPD or emphysema per chest x-ray and extended smoking history consistent with COPD Outpatient pulmonology consultation for PFTs and follow-up Started on brief steroids, IV Solu-Medrol 40 mg daily Started Brovana, Pulmicort nebs and as needed albuterol nebs Flutter valve Absolute tobacco cessation counseled. Chest  x-ray reviewed: Small left pleural effusion and left basilar atelectasis.  Incentive spirometry.  Type II  DM: A1c 6.7 CBGs uncontrolled in the 200-low 300 range.  Low-dose steroids likely to worsen it. Thereby adjusted insulins, increased Semglee from 10 to 15 units at bedtime, changed SSI to moderate sensitivity and added mealtime NovoLog 3 units Monitor closely and adjust insulins as needed  Electrolyte abnormalities (hypokalemia, hypomagnesemia and hypophosphatemia) Replaced.  Follow periodically.  Alcohol use disorder No overt withdrawal. Absolute cessation counseled.  Continue thiamine and folate. There was concern for alcoholic hepatitis but since his bilirubin is improving, no steroids started for this indication.  Hyperlipidemia Not actively taking statins  Hyponatremia Chronic in the setting of cirrhosis.  Tobacco use disorder Cessation counseled. Declines nicotine patch  Body mass index is 27.9 kg/m.  Nutritional Status Nutrition Problem: Inadequate oral intake Etiology: altered GI function Signs/Symptoms: NPO status Interventions: Ensure Enlive (each supplement provides 350kcal and 20 grams of protein), MVI  Pressure Ulcer:    ACP Documents: None present DVT prophylaxis: SCDs Start: 02/22/23 1152 Place and maintain sequential compression device Start: 02/22/23 1058     Code Status: Full Code:  Family Communication: Sister at bedside Disposition:  Status is: Inpatient Remains inpatient appropriate because: IV meds, COPD exacerbation, DC pending improvement in clinical situation, possibly in 48 hours     Consultants:   GI  Procedures:   EGD 8/20:  Findings FINDINGS: - Grade II esophageal varices with stigmata of recent bleeding.  Completely eradicated.  Banded.  - Portal hypertensive gastropathy. No gastric varices. - Normal examined duodenum.  - No specimens collected.   Antimicrobials:   IV ceftriaxone   Subjective:  Patient reports that since last night he has been having congested chest, coughing, dyspnea on minimal exertion.  Continues to  smoke.  No formal diagnosis of COPD or asthma but uses albuterol inhaler at home.  Objective:   Vitals:   02/25/23 0314 02/25/23 1332 02/25/23 2037 02/26/23 0459  BP: 121/87 115/61 123/85 111/80  Pulse: 93 94 98 99  Resp: 18  19   Temp: 98.4 F (36.9 C) 98.3 F (36.8 C) 98.5 F (36.9 C) 99 F (37.2 C)  TempSrc:  Oral Oral Oral  SpO2: 97% 90% 96% 95%  Weight:      Height:        General exam: Young male, moderately built and nourished lying propped up in bed with mild increased work of breathing while speaking.  Scleral and skin icterus. Respiratory system: Diminished breath sounds bilaterally with scattered bilateral medium pitched expiratory rhonchi.  No crackles.  Mild increased work of breathing while speaking. Cardiovascular system: S1 & S2 heard, RRR. No JVD, murmurs, rubs, gallops or clicks. No pedal edema. Gastrointestinal system: Abdomen is mildly distended, soft and nontender. No organomegaly or masses felt. Normal bowel sounds heard. Central nervous system: Alert and oriented. No focal neurological deficits. Extremities: Symmetric 5 x 5 power. Skin: No rashes, lesions or ulcers Psychiatry: Judgement and insight appear normal. Mood & affect appropriate.     Data Reviewed:   I have personally reviewed following labs and imaging studies   CBC: Recent Labs  Lab 02/22/23 0905 02/22/23 1130 02/24/23 0404 02/25/23 0414 02/26/23 0546  WBC 12.7*   < > 7.0 8.7 10.3  NEUTROABS 9.0*  --   --   --   --   HGB 11.5*   < > 8.2* 10.0* 8.5*  HCT 32.1*   < > 23.4* 29.6* 24.9*  MCV  99.7   < > 105.4* 109.6* 108.7*  PLT 110*   < > 71* 84* 77*   < > = values in this interval not displayed.    Basic Metabolic Panel: Recent Labs  Lab 02/22/23 0905 02/23/23 1300 02/24/23 0404 02/24/23 1152 02/25/23 0414 02/26/23 0546  NA 123* 129* 129*  --  131* 130*  K 2.7* 2.9* 2.6*  --  3.6 3.9  CL 77* 89* 93*  --  97* 97*  CO2 32 28 27  --  28 25  GLUCOSE 220* 292* 247*  --  226*  239*  BUN 18 17 11   --  8 6  CREATININE 0.99 0.82 0.76  --  0.80 0.72  CALCIUM 8.4* 7.7* 7.8*  --  8.3* 8.3*  MG  --   --   --  1.8 1.7  --   PHOS  --   --   --   --  2.1* 2.7    Liver Function Tests: Recent Labs  Lab 02/22/23 0905 02/23/23 1300 02/24/23 1152 02/25/23 0414 02/26/23 0546  AST 113* 98* 113* 136* 117*  ALT 38 31 36 44 42  ALKPHOS 222* 127* 120 152* 168*  BILITOT 10.3* 11.6* 9.3* 8.5* 5.9*  PROT 6.5 5.2* 5.1* 5.9* 5.2*  ALBUMIN 2.6* 2.4* 2.2* 2.6* 2.3*    CBG: Recent Labs  Lab 02/25/23 2033 02/26/23 0738 02/26/23 1142  GLUCAP 311* 212* 308*    Microbiology Studies:   Recent Results (from the past 240 hour(s))  Gram stain     Status: None   Collection Time: 02/23/23 10:10 AM   Specimen: Peritoneal Cavity  Result Value Ref Range Status   Specimen Description PERITONEAL CAVITY  Final   Special Requests NONE  Final   Gram Stain   Final    NO ORGANISMS SEEN NO WBC SEEN CYTOSPIN SMEAR Performed at [facility], [address] [place], [place] [address]    Report Status 02/23/2023 FINAL  Final  Culture, body fluid w Gram Stain-bottle     Status: None (Preliminary result)   Collection Time: 02/23/23 10:10 AM   Specimen: Peritoneal Cavity  Result Value Ref Range Status   Specimen Description PERITONEAL CAVITY  Final   Special Requests   Final    BOTTLES DRAWN AEROBIC AND ANAEROBIC Blood Culture adequate volume   Culture   Final    NO GROWTH 3 DAYS Performed at [facility], [address], [place], [place] [address]    Report Status PENDING  Incomplete    Radiology Studies:  DG Chest 2 View  Result Date: 02/26/2023 CLINICAL DATA:  COPD.  Left pleural effusion.  Cirrhosis. EXAM: CHEST - 2 VIEW COMPARISON:  02/22/2023 FINDINGS: The heart size and mediastinal contours are within normal limits. Small left pleural effusion again seen with mild left basilar atelectasis. Right lung remains clear. IMPRESSION: Small left pleural effusion and mild left  basilar atelectasis. Electronically Signed   By: Danae Orleans M.D.   On: 02/26/2023 12:00    Scheduled Meds:    arformoterol  15 mcg Nebulization BID   budesonide (PULMICORT) nebulizer solution  0.25 mg Nebulization BID   busPIRone  5 mg Oral BID   feeding supplement  237 mL Oral BID BM   FLUoxetine  20 mg Oral BID   folic acid  1 mg Intravenous Daily   furosemide  20 mg Oral Daily   insulin aspart  0-15 Units Subcutaneous TID WC   insulin aspart  0-5 Units Subcutaneous  QHS   insulin aspart  3 Units Subcutaneous TID WC   insulin glargine-yfgn  15 Units Subcutaneous QHS   lactulose  20 g Oral BID   methylPREDNISolone (SOLU-MEDROL) injection  40 mg Intravenous Q24H   midodrine  5 mg Oral TID WC   multivitamin with minerals  1 tablet Oral Daily   pantoprazole  40 mg Oral Daily   potassium chloride  40 mEq Oral Q4H   rifaximin  550 mg Oral BID   spironolactone  50 mg Oral Daily   thiamine (VITAMIN B1) injection  100 mg Intravenous Daily    Continuous Infusions:    potassium chloride       LOS: 4 days     Marcellus Scott, MD,  FACP, FHM, SFHM, [facility], [facility]   [facility] [place]      To contact the attending provider between 7A-7P or the covering provider during after hours 7P-7A, please log into the web site [email or web address] and access using universal [place] password for that web site. If you do not have the password, please call the hospital operator.  02/26/2023, 1:58 PM

## 2023-02-27 DIAGNOSIS — J441 Chronic obstructive pulmonary disease with (acute) exacerbation: Secondary | ICD-10-CM | POA: Diagnosis not present

## 2023-02-27 DIAGNOSIS — K920 Hematemesis: Secondary | ICD-10-CM | POA: Diagnosis not present

## 2023-02-27 LAB — COMPREHENSIVE METABOLIC PANEL
ALT: 41 U/L (ref 0–44)
AST: 110 U/L — ABNORMAL HIGH (ref 15–41)
Albumin: 2.3 g/dL — ABNORMAL LOW (ref 3.5–5.0)
Alkaline Phosphatase: 144 U/L — ABNORMAL HIGH (ref 38–126)
Anion gap: 8 (ref 5–15)
BUN: 8 mg/dL (ref 6–20)
CO2: 23 mmol/L (ref 22–32)
Calcium: 8.2 mg/dL — ABNORMAL LOW (ref 8.9–10.3)
Chloride: 96 mmol/L — ABNORMAL LOW (ref 98–111)
Creatinine, Ser: 0.78 mg/dL (ref 0.61–1.24)
GFR, Estimated: >60 mL/min (ref >60)
Glucose, Bld: 303 mg/dL — ABNORMAL HIGH (ref 70–99)
Potassium: 5 mmol/L (ref 3.5–5.1)
Sodium: 127 mmol/L — ABNORMAL LOW (ref 135–145)
Total Bilirubin: 6.4 mg/dL — ABNORMAL HIGH (ref 0.3–1.2)
Total Protein: 5.5 g/dL — ABNORMAL LOW (ref 6.5–8.1)

## 2023-02-27 LAB — CBC
HCT: 24.6 % — ABNORMAL LOW (ref 39.0–52.0)
Hemoglobin: 8.2 g/dL — ABNORMAL LOW (ref 13.0–17.0)
MCH: 36.9 pg — ABNORMAL HIGH (ref 26.0–34.0)
MCHC: 33.3 g/dL (ref 30.0–36.0)
MCV: 110.8 fL — ABNORMAL HIGH (ref 80.0–100.0)
Platelets: 81 10*3/uL — ABNORMAL LOW (ref 150–400)
RBC: 2.22 MIL/uL — ABNORMAL LOW (ref 4.22–5.81)
RDW: 15.9 % — ABNORMAL HIGH (ref 11.5–15.5)
WBC: 11.4 10*3/uL — ABNORMAL HIGH (ref 4.0–10.5)
nRBC: 0 % (ref 0.0–0.2)

## 2023-02-27 LAB — GLUCOSE, CAPILLARY
Glucose-Capillary: 282 mg/dL — ABNORMAL HIGH (ref 70–99)
Glucose-Capillary: 196 mg/dL — ABNORMAL HIGH (ref 70–99)
Glucose-Capillary: 259 mg/dL — ABNORMAL HIGH (ref 70–99)
Glucose-Capillary: 228 mg/dL — ABNORMAL HIGH (ref 70–99)

## 2023-02-27 MED ORDER — INSULIN ASPART 100 UNIT/ML IJ SOLN
0.0000 [IU] | Freq: Three times a day (TID) | INTRAMUSCULAR | Status: DC
  Administered 2023-02-27: 11 [IU] via SUBCUTANEOUS
  Administered 2023-02-27 – 2023-02-28 (×2): 4 [IU] via SUBCUTANEOUS
  Administered 2023-02-28: 11 [IU] via SUBCUTANEOUS

## 2023-02-27 MED ORDER — INSULIN ASPART 100 UNIT/ML IJ SOLN
0.0000 [IU] | Freq: Every day | INTRAMUSCULAR | Status: DC
  Administered 2023-02-27: 2 [IU] via SUBCUTANEOUS

## 2023-02-27 MED ORDER — PREDNISONE 20 MG PO TABS
30.0000 mg | ORAL_TABLET | Freq: Every day | ORAL | Status: DC
  Administered 2023-02-28: 30 mg via ORAL
  Filled 2023-02-27: qty 1

## 2023-02-27 MED ORDER — INSULIN GLARGINE-YFGN 100 UNIT/ML ~~LOC~~ SOLN
18.0000 [IU] | Freq: Every day | SUBCUTANEOUS | Status: DC
  Administered 2023-02-27: 18 [IU] via SUBCUTANEOUS
  Filled 2023-02-27 (×2): qty 0.18

## 2023-02-27 MED ORDER — INSULIN ASPART 100 UNIT/ML IJ SOLN: 0.0000 [IU] | Freq: Three times a day (TID) | INTRAMUSCULAR | Status: DC

## 2023-02-27 MED ORDER — INSULIN ASPART 100 UNIT/ML IJ SOLN
0.0000 [IU] | Freq: Three times a day (TID) | INTRAMUSCULAR | Status: DC
  Administered 2023-02-27: 11 [IU] via SUBCUTANEOUS

## 2023-02-27 NOTE — Progress Notes (Signed)
PROGRESS NOTE   Shawn Velazquez  ZHY:[id number]    DOB: 16-Nov-1965    DOA: 02/22/2023  PCP: [name], MD   I have briefly reviewed patients previous medical records in [facility].  Chief Complaint  Patient presents with   Hematemesis    Brief Narrative:  57 y.o. male with medical history significant of alcoholic cirrhosis with esophageal varices, fatty liver, gastroesophageal reflux disease, hyperlipidemia, type 2 diabetes on insulin, IDA followed by hematology for iron infusions, presented to the hospital secondary to nausea/vomiting and abdominal pain.  Patient's symptoms had been present for 2 days PTA and worsening.  Reports initially having episodes of vomiting with subsequent hematemesis events (more than 15 episodes at home).  He reported noticing 1 episode of melena on the day of admission.  Workup in the ED demonstrating decrease of patient's hemoglobin level from baseline by 2 g; MELD 3 score 29 at time of admission.  GI service consulted, s/p EGD and esophageal variceal banding 8/20.  Course complicated by COPD exacerbation.   Assessment & Plan:  Principal Problem:   GI bleed Active Problems:   Alcoholic cirrhosis of liver with ascites (HCC)   ABLA (acute blood loss anemia)   Hyperlipidemia   Alcohol abuse   Hypokalemia   Diabetes mellitus (HCC)   Elevated bilirubin   Esophageal varices with bleeding in diseases classified elsewhere [facility])   Portal vein thrombosis   Other ascites   Esophageal variceal bleeding/acute UGIB: Treated with bowel rest/n.p.o., IV octreotide drip, Protonix drip, IV vitamin K x 2, IV fluid resuscitation and IV ceftriaxone x 5 days for SBP prophylaxis. Serial CBCs were monitored but fortunately he has not required blood transfusions GI consulted, s/p EGD and esophageal variceal ligation and banding on 8/20 GI bleeding has stopped.  Hemoglobin stable. PPI switched to p.o., completed ceftriaxone course.  Tolerating regular  consistency diet. Home dose of midodrine resumed, resume carvedilol at time of discharge 3.125 Mg twice daily. Per GI follow-up, follow-up in GI clinic to have repeat surveillance upper endoscopy for esophageal varices in 4 weeks  Decompensated cirrhosis with portal hypertension, esophageal varices, ascites, prior portal vein thrombosis (not seen on most recent imaging): GI follow-up appreciated Bilirubin was trending down but back up again. 3.3 L ultrasound-guided paracentesis by IR on 8/21.  Patient reports that his abdomen is getting distended again, reassess in a.m. to decide if he needs paracentesis again but likely to reaccumulate. Back on home diuretics Lasix 20 mg daily and Aldactone 50 Mg daily, consider titrating up to 2 recurring ascites. Titrate lactulose to 2-3 BMs daily.  Remains on rifaximin. Completed ceftriaxone SBP prophylaxis. Continue PPI as noted above. Absolute alcohol cessation has been counseled. As per prior [facility] MD, evaluation for potential TIPS down the road initiated following recommendations by IR.  IR will arrange for outpatient follow-up in the next 4 to 6 weeks after discharge  Portal vein thrombosis: As per GI service recommendation, IR was consulted for potential thrombectomy. However CTA demonstrated no thrombus in the portal vein.  Acute blood loss anemia Secondary to upper GI bleed Most recent baseline hemoglobin is in the 9-10 range.  Presented with hemoglobin of 11.6 which has gradually drifted down but stable in the low to mid 8 g range. Follow CBCs periodically.  Thrombocytopenia: Secondary to cirrhosis and alcohol use Relatively stable.  COPD exacerbation: Noted worsening dyspnea, chest congestion since 8/23 night No formal diagnosis of COPD or emphysema per chest x-ray but extended smoking history  concerning for COPD or may have acute bronchitis. Outpatient pulmonology consultation for PFTs and follow-up Started on brief steroids, IV  Solu-Medrol 40 mg daily Started Brovana, Pulmicort nebs and as needed albuterol nebs Flutter valve Absolute tobacco cessation counseled. Chest x-ray reviewed: Small left pleural effusion and left basilar atelectasis.  Incentive spirometry. Improved.  Switch IV Solu-Medrol to oral prednisone for additional 2 days.  Type II DM: A1c 6.7 Low-dose steroids likely to worsen it. Thereby adjusted insulins, increased Semglee from 10 to 18 units at bedtime, changed SSI to moderate sensitivity and added mealtime NovoLog 5 units Monitor closely and adjust insulins as needed.  Electrolyte abnormalities (hypokalemia, hypomagnesemia and hypophosphatemia) Replaced.  Follow periodically.  Alcohol use disorder No overt withdrawal. Absolute cessation counseled.  Continue thiamine and folate. There was concern for alcoholic hepatitis but since his bilirubin is improving, no steroids started for this indication.  Hyperlipidemia Not actively taking statins  Hyponatremia Chronic in the setting of cirrhosis.  Tobacco use disorder Cessation counseled. Declines nicotine patch  Body mass index is 27.9 kg/m.  Nutritional Status Nutrition Problem: Inadequate oral intake Etiology: altered GI function Signs/Symptoms: NPO status Interventions: Ensure Enlive (each supplement provides 350kcal and 20 grams of protein), MVI  ACP Documents: None present DVT prophylaxis: SCDs Start: 02/22/23 1152 Place and maintain sequential compression device Start: 02/22/23 1058     Code Status: Full Code:  Family Communication: None at bedside Disposition:  Status is: Inpatient Pending stability of respiratory status and labs, hopeful DC home tomorrow     Consultants:   GI IR  Procedures:   EGD 8/20:  Findings FINDINGS: - Grade II esophageal varices with stigmata of recent bleeding.  Completely eradicated.  Banded.  - Portal hypertensive gastropathy. No gastric varices. - Normal examined duodenum.  - No  specimens collected.   Antimicrobials:   IV ceftriaxone-completed   Subjective:  States that he had multiple episodes of nonbloody emesis last night but none since then.  Tolerated breakfast this morning.  No further BMs.  Breathing is much better and denies dyspnea.  Objective:   Vitals:   02/27/23 0402 02/27/23 0904 02/27/23 0907 02/27/23 1228  BP: (!) 97/54   119/83  Pulse: 93   (!) 107  Resp: 16   16  Temp: 98.6 F (37 C)   98.8 F (37.1 C)  TempSrc: Oral   Oral  SpO2: 97% 94% 100% 94%  Weight:      Height:        General exam: Young male, moderately built and nourished and up in bed.  Looks much improved compared to yesterday.  Scleral and skin icterus. Respiratory system: Much improved.  Clear to auscultation except in the bases with slightly diminished breath sounds.  No wheezing or rhonchi heard.  No increased work of breathing. Cardiovascular system: S1 & S2 heard, RRR. No JVD, murmurs, rubs, gallops or clicks. No pedal edema.  Telemetry personally reviewed: Sinus rhythm with BBB morphology. Gastrointestinal system: Abdomen is mildly distended, soft and nontender. No organomegaly or masses felt. Normal bowel sounds heard. Central nervous system: Alert and oriented. No focal neurological deficits. Extremities: Symmetric 5 x 5 power. Skin: No rashes, lesions or ulcers Psychiatry: Judgement and insight appear normal. Mood & affect appropriate.     Data Reviewed:   I have personally reviewed following labs and imaging studies   CBC: Recent Labs  Lab 02/22/23 0905 02/22/23 1130 02/25/23 0414 02/26/23 0546 02/27/23 0418  WBC 12.7*   < > 8.7 10.3  11.4*  NEUTROABS 9.0*  --   --   --   --   HGB 11.5*   < > 10.0* 8.5* 8.2*  HCT 32.1*   < > 29.6* 24.9* 24.6*  MCV 99.7   < > 109.6* 108.7* 110.8*  PLT 110*   < > 84* 77* 81*   < > = values in this interval not displayed.    Basic Metabolic Panel: Recent Labs  Lab 02/23/23 1300 02/24/23 0404 02/24/23 1152  02/25/23 0414 02/26/23 0546 02/27/23 0418  NA 129* 129*  --  131* 130* 127*  K 2.9* 2.6*  --  3.6 3.9 5.0  CL 89* 93*  --  97* 97* 96*  CO2 28 27  --  28 25 23   GLUCOSE 292* 247*  --  226* 239* 303*  BUN 17 11  --  8 6 8   CREATININE 0.82 0.76  --  0.80 0.72 0.78  CALCIUM 7.7* 7.8*  --  8.3* 8.3* 8.2*  MG  --   --  1.8 1.7  --   --   PHOS  --   --   --  2.1* 2.7  --     Liver Function Tests: Recent Labs  Lab 02/23/23 1300 02/24/23 1152 02/25/23 0414 02/26/23 0546 02/27/23 0418  AST 98* 113* 136* 117* 110*  ALT 31 36 44 42 41  ALKPHOS 127* 120 152* 168* 144*  BILITOT 11.6* 9.3* 8.5* 5.9* 6.4*  PROT 5.2* 5.1* 5.9* 5.2* 5.5*  ALBUMIN 2.4* 2.2* 2.6* 2.3* 2.3*    CBG: Recent Labs  Lab 02/27/23 0720 02/27/23 1123 02/27/23 1610  GLUCAP 282* 196* 259*    Microbiology Studies:   Recent Results (from the past 240 hour(s))  Gram stain     Status: None   Collection Time: 02/23/23 10:10 AM   Specimen: Peritoneal Cavity  Result Value Ref Range Status   Specimen Description PERITONEAL CAVITY  Final   Special Requests NONE  Final   Gram Stain   Final    NO ORGANISMS SEEN NO WBC SEEN CYTOSPIN SMEAR Performed at [facility], [address] [place], [place] [address]    Report Status 02/23/2023 FINAL  Final  Culture, body fluid w Gram Stain-bottle     Status: None (Preliminary result)   Collection Time: 02/23/23 10:10 AM   Specimen: Peritoneal Cavity  Result Value Ref Range Status   Specimen Description PERITONEAL CAVITY  Final   Special Requests   Final    BOTTLES DRAWN AEROBIC AND ANAEROBIC Blood Culture adequate volume   Culture   Final    NO GROWTH 4 DAYS Performed at [facility], [address], [place], [place] [address]    Report Status PENDING  Incomplete    Radiology Studies:  DG Chest 2 View  Result Date: 02/26/2023 CLINICAL DATA:  COPD.  Left pleural effusion.  Cirrhosis. EXAM: CHEST - 2 VIEW COMPARISON:  02/22/2023 FINDINGS: The heart size and  mediastinal contours are within normal limits. Small left pleural effusion again seen with mild left basilar atelectasis. Right lung remains clear. IMPRESSION: Small left pleural effusion and mild left basilar atelectasis. Electronically Signed   By: Danae Orleans M.D.   On: 02/26/2023 12:00    Scheduled Meds:    arformoterol  15 mcg Nebulization BID   budesonide (PULMICORT) nebulizer solution  0.25 mg Nebulization BID   busPIRone  5 mg Oral BID   feeding supplement  237 mL Oral BID BM   FLUoxetine  20  mg Oral BID   folic acid  1 mg Intravenous Daily   furosemide  20 mg Oral Daily   insulin aspart  0-20 Units Subcutaneous TID WC   insulin aspart  0-5 Units Subcutaneous QHS   insulin aspart  5 Units Subcutaneous TID WC   insulin glargine-yfgn  15 Units Subcutaneous QHS   lactulose  20 g Oral BID   methylPREDNISolone (SOLU-MEDROL) injection  40 mg Intravenous Q24H   midodrine  5 mg Oral TID WC   multivitamin with minerals  1 tablet Oral Daily   pantoprazole  40 mg Oral Daily   rifaximin  550 mg Oral BID   spironolactone  50 mg Oral Daily   thiamine (VITAMIN B1) injection  100 mg Intravenous Daily    Continuous Infusions:       LOS: 5 days     Marcellus Scott, MD,  FACP, FHM, SFHM, [facility], [facility]   [facility] [place]     To contact the attending provider between 7A-7P or the covering provider during after hours 7P-7A, please log into the web site [email or web address] and access using universal [place] password for that web site. If you do not have the password, please call the hospital operator.  02/27/2023, 4:43 PM

## 2023-02-28 ENCOUNTER — Other Ambulatory Visit: Payer: Self-pay | Admitting: *Deleted

## 2023-02-28 ENCOUNTER — Inpatient Hospital Stay: Payer: BC Managed Care – PPO

## 2023-02-28 DIAGNOSIS — J441 Chronic obstructive pulmonary disease with (acute) exacerbation: Secondary | ICD-10-CM | POA: Diagnosis not present

## 2023-02-28 DIAGNOSIS — K920 Hematemesis: Secondary | ICD-10-CM | POA: Diagnosis not present

## 2023-02-28 DIAGNOSIS — K922 Gastrointestinal hemorrhage, unspecified: Secondary | ICD-10-CM

## 2023-02-28 LAB — CBC
HCT: 25.4 % — ABNORMAL LOW (ref 39.0–52.0)
Hemoglobin: 8.6 g/dL — ABNORMAL LOW (ref 13.0–17.0)
MCH: 37.4 pg — ABNORMAL HIGH (ref 26.0–34.0)
MCHC: 33.9 g/dL (ref 30.0–36.0)
MCV: 110.4 fL — ABNORMAL HIGH (ref 80.0–100.0)
Platelets: 81 10*3/uL — ABNORMAL LOW (ref 150–400)
RBC: 2.3 MIL/uL — ABNORMAL LOW (ref 4.22–5.81)
RDW: 16.6 % — ABNORMAL HIGH (ref 11.5–15.5)
WBC: 12.1 10*3/uL — ABNORMAL HIGH (ref 4.0–10.5)
nRBC: 0.2 % (ref 0.0–0.2)

## 2023-02-28 LAB — COMPREHENSIVE METABOLIC PANEL
ALT: 42 U/L (ref 0–44)
AST: 95 U/L — ABNORMAL HIGH (ref 15–41)
Albumin: 2.2 g/dL — ABNORMAL LOW (ref 3.5–5.0)
Alkaline Phosphatase: 140 U/L — ABNORMAL HIGH (ref 38–126)
Anion gap: 8 (ref 5–15)
BUN: 10 mg/dL (ref 6–20)
CO2: 21 mmol/L — ABNORMAL LOW (ref 22–32)
Calcium: 8.3 mg/dL — ABNORMAL LOW (ref 8.9–10.3)
Chloride: 98 mmol/L (ref 98–111)
Creatinine, Ser: 0.69 mg/dL (ref 0.61–1.24)
GFR, Estimated: >60 mL/min (ref >60)
Glucose, Bld: 306 mg/dL — ABNORMAL HIGH (ref 70–99)
Potassium: 4.9 mmol/L (ref 3.5–5.1)
Sodium: 127 mmol/L — ABNORMAL LOW (ref 135–145)
Total Bilirubin: 5.9 mg/dL — ABNORMAL HIGH (ref 0.3–1.2)
Total Protein: 5.4 g/dL — ABNORMAL LOW (ref 6.5–8.1)

## 2023-02-28 LAB — GLUCOSE, CAPILLARY
Glucose-Capillary: 254 mg/dL — ABNORMAL HIGH (ref 70–99)
Glucose-Capillary: 195 mg/dL — ABNORMAL HIGH (ref 70–99)

## 2023-02-28 LAB — PROTIME-INR
INR: 1.7 — ABNORMAL HIGH (ref 0.8–1.2)
Prothrombin Time: 20.3 seconds — ABNORMAL HIGH (ref 11.4–15.2)

## 2023-02-28 LAB — CULTURE, BODY FLUID W GRAM STAIN -BOTTLE
Culture: NO GROWTH
Special Requests: ADEQUATE

## 2023-02-28 MED ORDER — FUROSEMIDE 40 MG PO TABS: 20.0000 mg | ORAL_TABLET | Freq: Every day | ORAL | 1 refills | Status: DC

## 2023-02-28 MED ORDER — ALBUTEROL SULFATE HFA 108 (90 BASE) MCG/ACT IN AERS: 2.0000 | INHALATION_SPRAY | Freq: Four times a day (QID) | RESPIRATORY_TRACT | 2 refills | Status: DC | PRN

## 2023-02-28 MED ORDER — BUSPIRONE HCL 5 MG PO TABS: 5.0000 mg | ORAL_TABLET | Freq: Every day | ORAL | Status: DC | PRN

## 2023-02-28 MED ORDER — LACTULOSE 10 GM/15ML PO SOLN: 20.0000 g | Freq: Two times a day (BID) | ORAL | 2 refills | Status: DC

## 2023-02-28 MED ORDER — ENSURE ENLIVE PO LIQD: 237.0000 mL | Freq: Two times a day (BID) | ORAL | 12 refills | Status: DC

## 2023-02-28 MED ORDER — FLUOXETINE HCL 10 MG PO CAPS: 10.0000 mg | ORAL_CAPSULE | Freq: Two times a day (BID) | ORAL | Status: DC

## 2023-02-28 MED ORDER — PANTOPRAZOLE SODIUM 40 MG PO TBEC: 40.0000 mg | DELAYED_RELEASE_TABLET | Freq: Every day | ORAL | 1 refills | Status: DC

## 2023-02-28 NOTE — Discharge Summary (Signed)
Physician Discharge Summary  Shawn Velazquez ZOX:[id number] DOB: 03-19-1966  PCP: [name], MD  Admitted from: Home Discharged to: Home  Admit date: 02/22/2023 Discharge date: 02/28/2023  Recommendations for Outpatient Follow-up:    Follow-up Information     [facility] Follow up.   Why: IR schedulers will call you to discuss setting up a consultation to discuss possible TIPS procedure in about a month. Please call [phone] with questions or concerns prior to your appointment time. Contact information: [address] [place] [address] [phone]         [name], MD. Schedule an appointment as soon as possible for a visit in 1 week(s).   Specialty: Internal Medicine Why: To be seen with repeat labs (CBC, CMP, PT, INR & magnesium). Contact information: [address] [place] [address] [phone]                  Home Health: None    Equipment/Devices: None    Discharge Condition: Improved and stable.  Overall prognosis guarded and at risk for recurrent decompensation.   Code Status: Full Code Diet recommendation:  Discharge Diet Orders (From admission, onward)     Start     Ordered   02/28/23 0000  Diet - low sodium heart healthy        02/28/23 1225   02/28/23 0000  Diet Carb Modified        02/28/23 1225             Discharge Diagnoses:  Principal Problem:   GI bleed Active Problems:   Alcoholic cirrhosis of liver with ascites (HCC)   ABLA (acute blood loss anemia)   Hyperlipidemia   Alcohol abuse   Hypokalemia   Diabetes mellitus (HCC)   Elevated bilirubin   Esophageal varices with bleeding in diseases classified elsewhere [facility])   Portal vein thrombosis   Other ascites   Brief Summary: 57 y.o. male with medical history significant of alcoholic cirrhosis with esophageal varices, fatty liver, gastroesophageal reflux disease, hyperlipidemia, type 2 diabetes on insulin, IDA  followed by hematology for iron infusions, presented to the hospital secondary to nausea/vomiting and abdominal pain.  Patient's symptoms had been present for 2 days PTA and worsening.  Reports initially having episodes of vomiting with subsequent hematemesis events (more than 15 episodes at home).  He reported noticing 1 episode of melena on the day of admission.   Workup in the ED demonstrating decrease of patient's hemoglobin level from baseline by 2 g; MELD 3 score 29 at time of admission.  GI service consulted, s/p EGD and esophageal variceal banding 8/20.  Course complicated by COPD exacerbation.     Assessment & Plan:   Esophageal variceal bleeding/acute UGIB: Treated with bowel rest/n.p.o., IV octreotide drip, Protonix drip, IV vitamin K x 2, IV fluid resuscitation and IV ceftriaxone x 5 days for SBP prophylaxis. Serial CBCs were monitored but fortunately he has not required blood transfusions GI consulted, s/p EGD and esophageal variceal ligation and banding on 8/20 GI bleeding has stopped.  Hemoglobin stable. PPI switched to p.o., completed ceftriaxone course.  Tolerating regular consistency diet. Home dose of midodrine resumed, resumed carvedilol at time of discharge 3.125 Mg twice daily. Per GI follow-up, follow-up in GI clinic to have repeat surveillance upper endoscopy for esophageal varices in 4 weeks GI follow-up appreciated.  They will arrange outpatient follow-up and have cleared him for discharge home.   Decompensated cirrhosis  with portal hypertension, esophageal varices, ascites, prior portal vein thrombosis (not seen on most recent imaging): GI follow-up appreciated Bilirubin was trending down but back up again. 3.3 L ultrasound-guided paracentesis by IR on 8/21.  No complaint of worsening abdominal pain or distention.   As per GI follow-up, continue Lasix 20 mg daily and Aldactone 50 Mg daily, consider titrating up to 2 recurring ascites. Titrate lactulose to 2-3 BMs  daily, dose increased this admission to 20 g twice daily.  Remains on rifaximin. Completed ceftriaxone SBP prophylaxis. Continue PPI as noted above. Absolute alcohol cessation has been counseled. As per prior TRH MD, evaluation for potential TIPS down the road initiated following recommendations by IR.  IR will arrange for outpatient follow-up in the next 4 to 6 weeks after discharge   Portal vein thrombosis: As per GI service recommendation, IR was consulted for potential thrombectomy. However CTA demonstrated no thrombus in the portal vein.   Acute blood loss anemia Secondary to upper GI bleed Most recent baseline hemoglobin is in the 9-10 range.  Presented with hemoglobin of 11.6 which has gradually drifted down but stable in the mid 8 g range. Follow CBCs periodically.   Thrombocytopenia: Secondary to cirrhosis and alcohol use Relatively stable.   COPD exacerbation: Noted worsening dyspnea, chest congestion since 8/23 night No formal diagnosis of COPD or emphysema per chest x-ray but extended smoking history concerning for COPD or may have acute bronchitis. Outpatient pulmonology consultation for PFTs and follow-up Started on brief steroids, IV Solu-Medrol 40 mg daily x 2 days followed by oral prednisone 30 mg x 1 day Started Brovana, Pulmicort nebs and as needed albuterol nebs Flutter valve Absolute tobacco cessation counseled. Chest x-ray reviewed: Small left pleural effusion and left basilar atelectasis.  Incentive spirometry. Clinically improved.  Clinical bronchospasm has resolved even yesterday.  Completed 3 days of steroids.  Given his marked hyperglycemia and underlying cirrhosis, no further steroids at discharge.  As needed albuterol inhaler.   Type II DM: A1c 6.7 Low-dose steroids worsened control.  Moreover he was also on reduced than home dose of insulins. Continue her home dose of insulins with close CBG monitoring and outpatient follow-up.  Above A1c indicates  reasonable outpatient control.   Electrolyte abnormalities (hypokalemia, hypomagnesemia and hypophosphatemia) Replaced.  Follow periodically.   Alcohol use disorder No overt withdrawal. Absolute cessation counseled.  Continue thiamine and folate. There was concern for alcoholic hepatitis but since his bilirubin is improving, no steroids started for this indication.   Hyperlipidemia Not actively taking statins   Hyponatremia Chronic in the setting of cirrhosis.  Additionally has element of pseudohyponatremia from marked hyperglycemia.  Serum sodium corrects to 130-132 for hyperglycemia.   Tobacco use disorder Cessation counseled. Declines nicotine patch   Body mass index is 27.9 kg/m.   Nutritional Status Nutrition Problem: Inadequate oral intake Etiology: altered GI function Signs/Symptoms: NPO status Interventions: Ensure Enlive (each supplement provides 350kcal and 20 grams of protein), MVI    Consultants:   GI IR   Procedures:   EGD 8/20:   Findings FINDINGS: - Grade II esophageal varices with stigmata of recent bleeding.  Completely eradicated.  Banded.  - Portal hypertensive gastropathy. No gastric varices. - Normal examined duodenum.  - No specimens collected.   Discharge Instructions  Discharge Instructions     Call MD for:   Complete by: As directed    Vomiting coffee colored material or blood, passing black tarry stools or blood in stools   Call MD for:  difficulty breathing, headache or visual disturbances   Complete by: As directed    Call MD for:  extreme fatigue   Complete by: As directed    Call MD for:  persistant dizziness or light-headedness   Complete by: As directed    Call MD for:  persistant nausea and vomiting   Complete by: As directed    Call MD for:  severe uncontrolled pain   Complete by: As directed    Call MD for:  temperature >100.4   Complete by: As directed    Diet - low sodium heart healthy   Complete by: As directed     Diet Carb Modified   Complete by: As directed    Increase activity slowly   Complete by: As directed         Medication List     TAKE these medications    albuterol 108 (90 Base) MCG/ACT inhaler Commonly known as: VENTOLIN HFA Inhale 2 puffs into the lungs every 6 (six) hours as needed for wheezing or shortness of breath.   busPIRone 5 MG tablet Commonly known as: BUSPAR Take 1 tablet (5 mg total) by mouth daily as needed (anxiety).   carvedilol 3.125 MG tablet Commonly known as: COREG Take 1 tablet (3.125 mg total) by mouth 2 (two) times daily with a meal.   Dexcom G7 Sensor Misc Inject 1 application  into the skin as directed. Change sensor every 10 days as directed.   feeding supplement Liqd Take 237 mLs by mouth 2 (two) times daily between meals.   ferrous sulfate 324 MG Tbec Take 1 tablet (324 mg total) by mouth daily with breakfast.   FLUoxetine 10 MG capsule Commonly known as: PROZAC Take 1 capsule (10 mg total) by mouth 2 (two) times daily. What changed: See the new instructions.   folic acid 1 MG tablet Commonly known as: FOLVITE TAKE 1 TABLET BY MOUTH DAILY   furosemide 40 MG tablet Commonly known as: Lasix Take 0.5 tablets (20 mg total) by mouth daily. What changed: how much to take   gabapentin 100 MG capsule Commonly known as: NEURONTIN Take 1 tablet by mouth daily.   glucose blood test strip Use as instructed to monitor glucose 4 times daily   insulin lispro 100 UNIT/ML KwikPen Commonly known as: HumaLOG KwikPen Inject 5-11 Units into the skin 3 (three) times daily.   lactulose 10 GM/15ML solution Commonly known as: CHRONULAC Take 30 mLs (20 g total) by mouth 2 (two) times daily. What changed: how much to take   Lantus SoloStar 100 UNIT/ML Solostar Pen Generic drug: insulin glargine Inject 30 Units into the skin at bedtime.   levocetirizine 5 MG tablet Commonly known as: XYZAL Take 1 tablet by mouth daily.   midodrine 5 MG  tablet Commonly known as: PROAMATINE Take 1 tablet (5 mg total) by mouth 3 (three) times daily with meals. To help Prevent Low Blood Pressure What changed: when to take this   multivitamin tablet Take 1 tablet by mouth daily.   ondansetron 4 MG disintegrating tablet Commonly known as: ZOFRAN-ODT Take 1 tablet (4 mg total) by mouth every 8 (eight) hours as needed for nausea or vomiting.   pantoprazole 40 MG tablet Commonly known as: Protonix Take 1 tablet (40 mg total) by mouth daily. What changed: when to take this   Pen Needles 32G X 4 MM Misc 1 each by Does not apply route in the morning, at noon, in the evening, and at bedtime. Use  to inject insulin 4 times daily   spironolactone 50 MG tablet Commonly known as: ALDACTONE Take 1 tablet (50 mg total) by mouth daily.   thiamine 100 MG tablet Commonly known as: Vitamin B-1 Take 1 tablet (100 mg total) by mouth daily.   Xifaxan 550 MG Tabs tablet Generic drug: rifaximin TAKE 1 TABLET BY MOUTH TWICE DAILY       Allergies  Allergen Reactions   Neosporin + Pain Relief Max St [Neomy-Bacit-Polymyx-Pramoxine] Rash      Procedures/Studies: DG Chest 2 View  Result Date: 02/26/2023 CLINICAL DATA:  COPD.  Left pleural effusion.  Cirrhosis. EXAM: CHEST - 2 VIEW COMPARISON:  02/22/2023 FINDINGS: The heart size and mediastinal contours are within normal limits. Small left pleural effusion again seen with mild left basilar atelectasis. Right lung remains clear. IMPRESSION: Small left pleural effusion and mild left basilar atelectasis. Electronically Signed   By: Danae Orleans M.D.   On: 02/26/2023 12:00   CT ANGIO ABD/PELVIS BRTO  Result Date: 02/24/2023 CLINICAL DATA:  Hematemesis, portal vein thrombosis. History of cirrhosis and portal hypertension. EXAM: CTA ABDOMEN AND PELVIS WITHOUT AND WITH CONTRAST TECHNIQUE: Multidetector CT imaging of the abdomen and pelvis was performed using the standard protocol during bolus administration  of intravenous contrast. Multiplanar reconstructed images and MIPs were obtained and reviewed to evaluate the vascular anatomy. RADIATION DOSE REDUCTION: This exam was performed according to the departmental dose-optimization program which includes automated exposure control, adjustment of the mA and/or kV according to patient size and/or use of iterative reconstruction technique. CONTRAST:  OMNIPAQUE IOHEXOL 350 MG/ML SOLN COMPARISON:  MRI of the abdomen 08/03/2022 FINDINGS: VASCULAR Aorta: Normal caliber aorta without aneurysm, dissection, vasculitis or significant stenosis. Mild scattered atherosclerotic vascular calcifications. Celiac: Patent without evidence of aneurysm, dissection, vasculitis or significant stenosis. SMA: Patent without evidence of aneurysm, dissection, vasculitis or significant stenosis. Renals: Both main renal arteries are patent without evidence of aneurysm, dissection, vasculitis, fibromuscular dysplasia or significant stenosis. Small accessory renal arteries are noted bilaterally. IMA: Patent without evidence of aneurysm, dissection, vasculitis or significant stenosis. Inflow: Patent without evidence of aneurysm, dissection, vasculitis or significant stenosis. Proximal Outflow: Bilateral common femoral and visualized portions of the superficial and profunda femoral arteries are patent without evidence of aneurysm, dissection, vasculitis or significant stenosis. Veins: The superior mesenteric vein, inferior mesenteric vein, main portal vein and intrahepatic right and left portal veins are all widely patent. Splenic vein is relatively small in caliber but remains patent. Significant hypertrophy of the left gastric vein resulting in gastroesophageal varices. Recanalized periumbilical vein. Review of the MIP images confirms the above findings. NON-VASCULAR Lower chest: Moderately large layering left pleural effusion. Associated atelectasis. Subpleural reticulation, and architectural  distortion in the periphery of the visualized lungs consistent with pulmonary fibrosis/interstitial lung disease. The heart is normal in size. Esophageal varices are present. Hepatobiliary: Abnormal morphology of the liver. Hypertrophy of the left lobe and relative to the right with a micro nodular contour consistent with cirrhosis. No arterially enhancing mass. Small gallstones are present within the gallbladder lumen. No significant biliary ductal dilatation. Pancreas: Unremarkable. No pancreatic ductal dilatation or surrounding inflammatory changes. Spleen: Normal in size without focal abnormality. Adrenals/Urinary Tract: Unremarkable adrenal glands. No hydronephrosis, nephrolithiasis or enhancing renal mass. Stomach/Bowel: No focal bowel wall thickening or evidence of obstruction. Lymphatic: No suspicious lymphadenopathy. Reproductive: Prostate is unremarkable. Other: Small volume ascites and mesenteric edema. Right inguinal hernia containing ascitic fluid and omental fat. Musculoskeletal: No acute fracture or aggressive appearing  lytic or blastic osseous lesion. IMPRESSION: 1. Hepatic cirrhosis with evidence of portal hypertension (esophageal varices, small volume ascites and moderate left hepatic hydrothorax). 2. No evidence of portal venous thrombosis. 3. Hypertrophic left gastric vein and short gastric veins result in moderate esophageal varices. 4.  Aortic Atherosclerosis (ICD10-I70.0). 5. Right inguinal hernia containing omental fat and ascitic fluid. 6. Cholelithiasis. Electronically Signed   By: Malachy Moan M.D.   On: 02/24/2023 06:20   ECHOCARDIOGRAM COMPLETE  Result Date: 02/23/2023    ECHOCARDIOGRAM REPORT   Patient Name:   Shawn Velazquez Date of Exam: 02/23/2023 Medical Rec #:  [id number]         Height:       66.0 in Accession #:    [id number]        Weight:       172.8 lb Date of Birth:  [birth year]        BSA:          1.880 m Patient Age:    56 years          BP:           95/54 mmHg  Patient Gender: M                 HR:           71 bpm. [facility] Procedure: 2D Echo, Cardiac Doppler and Color Doppler Indications:    S/P TIPS (transjugular intrahepatic portosystemic shunt)                 [[id number]]  History:        Patient has prior history of Echocardiogram examinations, most                 recent 08/03/2022. Risk Factors:Hypertension, Diabetes and                 Dyslipidemia. Hx of Substance and Alcohol abuse.  Sonographer:    Celesta Gentile RCS Referring Phys: [phone] CARLOS MADERA IMPRESSIONS  1. Left ventricular ejection fraction, by estimation, is 70 to 75%. The left ventricle has hyperdynamic function. The left ventricle has no regional wall motion abnormalities. There is mild concentric left ventricular hypertrophy. Left ventricular diastolic parameters were normal.  2. Right ventricular systolic function is normal. The right ventricular size is normal. Tricuspid regurgitation signal is inadequate for assessing PA pressure.  3. Left atrial size was mildly dilated.  4. Apparent ascitic fluid visualized.  5. The mitral valve is grossly normal. Trivial mitral valve regurgitation.  6. The aortic valve is tricuspid. There is mild calcification of the aortic valve. Aortic valve regurgitation is not visualized. Aortic valve sclerosis is present, with no evidence of aortic valve stenosis.  7. The inferior vena cava is normal in size with greater than 50% respiratory variability, suggesting right atrial pressure of 3 mmHg. Comparison(s): Prior images reviewed side by side. LVEF vigorous at 70-75%. FINDINGS  Left Ventricle: Left ventricular ejection fraction, by estimation, is 70 to 75%. The left ventricle has hyperdynamic function. The left ventricle has no regional wall motion abnormalities. The left ventricular internal cavity size was normal in size. There is mild concentric left ventricular hypertrophy. Left ventricular diastolic parameters were normal. Right Ventricle: The right  ventricular size is normal. No increase in right ventricular wall thickness. Right ventricular systolic function is normal. Tricuspid regurgitation signal is inadequate for assessing PA pressure. Left Atrium: Left atrial size was mildly dilated. Right Atrium: Right atrial size  was normal in size. Pericardium: Apparent ascitic fluid visualized. There is no evidence of pericardial effusion. Presence of epicardial fat layer. Mitral Valve: The mitral valve is grossly normal. Trivial mitral valve regurgitation. Tricuspid Valve: The tricuspid valve is grossly normal. Tricuspid valve regurgitation is trivial. Aortic Valve: The aortic valve is tricuspid. There is mild calcification of the aortic valve. There is mild aortic valve annular calcification. Aortic valve regurgitation is not visualized. Aortic valve sclerosis is present, with no evidence of aortic valve stenosis. Pulmonic Valve: The pulmonic valve was grossly normal. Pulmonic valve regurgitation is trivial. Aorta: The aortic root is normal in size and structure. Venous: The inferior vena cava is normal in size with greater than 50% respiratory variability, suggesting right atrial pressure of 3 mmHg. IAS/Shunts: No atrial level shunt detected by color flow Doppler.  LEFT VENTRICLE PLAX 2D LVIDd:         4.70 cm   Diastology LVIDs:         2.50 cm   LV e' medial:    9.03 cm/s LV PW:         1.20 cm   LV E/e' medial:  10.0 LV IVS:        1.00 cm   LV e' lateral:   9.25 cm/s LVOT diam:     2.00 cm   LV E/e' lateral: 9.8 LV SV:         71 LV SV Index:   38 LVOT Area:     3.14 cm  RIGHT VENTRICLE RV S prime:     14.30 cm/s TAPSE (M-mode): 2.8 cm LEFT ATRIUM             Index        RIGHT ATRIUM           Index LA diam:        4.10 cm 2.18 cm/m   RA Area:     17.60 cm LA Vol (A2C):   56.0 ml 29.79 ml/m  RA Volume:   49.30 ml  26.23 ml/m LA Vol (A4C):   69.9 ml 37.19 ml/m LA Biplane Vol: 64.4 ml 34.26 ml/m  AORTIC VALVE LVOT Vmax:   109.00 cm/s LVOT Vmean:  68.500  cm/s LVOT VTI:    0.226 m  AORTA Ao Root diam: 3.40 cm MITRAL VALVE MV Area (PHT): 3.48 cm    SHUNTS MV Decel Time: 218 msec    Systemic VTI:  0.23 m MV E velocity: 90.40 cm/s  Systemic Diam: 2.00 cm MV A velocity: 84.00 cm/s MV E/A ratio:  1.08 Nona Dell MD Electronically signed by Nona Dell MD Signature Date/Time: 02/23/2023/5:26:08 PM    Final    US Paracentesis  Result Date: 02/23/2023 INDICATION: Patient with history of ETOH cirrhosis, recurrent ascites. Currently admitted for esophageal variceal bleeding. Request for diagnostic and therapeutic paracentesis. EXAM: ULTRASOUND GUIDED DIAGNOSTIC AND THERAPEUTIC PARACENTESIS MEDICATIONS: 7 mL 1% lidocaine. COMPLICATIONS: None immediate. PROCEDURE: Informed written consent was obtained from the patient after a discussion of the risks, benefits and alternatives to treatment. A timeout was performed prior to the initiation of the procedure. Initial ultrasound scanning demonstrates a moderate amount of ascites within the right lower abdominal quadrant. The right lower abdomen was prepped and draped in the usual sterile fashion. 1% lidocaine was used for local anesthesia. Following this, a 19 gauge, 7-cm, Yueh catheter was introduced. An ultrasound image was saved for documentation purposes. The paracentesis was performed. The catheter was removed and a dressing was applied.  The patient tolerated the procedure well without immediate post procedural complication. Patient received post-procedure intravenous albumin; see nursing notes for details. FINDINGS: A total of approximately 3.3 L of clear yellow fluid was removed. Samples were sent to the laboratory as requested by the clinical team. IMPRESSION: Successful ultrasound-guided paracentesis yielding 3.3 liters of peritoneal fluid. Performed by Lynnette Caffey, PA-C Electronically Signed   By: Acquanetta Belling M.D.   On: 02/23/2023 13:27   DG Chest Port 1 View  Result Date: 02/22/2023 CLINICAL DATA:   100030 Leukocytosis 100030 97749 Alcoholic hepatitis 97749 EXAM: PORTABLE CHEST 1 VIEW COMPARISON:  Chest x-ray 08/02/2022, CT chest 03/13/2014 FINDINGS: The heart and mediastinal contours are unchanged. Biapical pleural/pulmonary scarring. Cystic changes of lung apices. Question left base airspace opacity that may represent atelectasis. Chronic coarsened markings with no overt pulmonary edema. No pleural effusion. No pneumothorax. No acute osseous abnormality. IMPRESSION: 1. Question left base airspace opacity that may represent atelectasis or eventration of the hemidiaphragm. Recommend repeat PA and lateral view of the chest. 2.  Emphysema (ICD10-J43.9). Electronically Signed   By: Tish Frederickson M.D.   On: 02/22/2023 20:32   US Abdomen Limited RUQ (LIVER/GB)  Result Date: 02/22/2023 CLINICAL DATA:  Upper abdominal pain and nausea. EXAM: ULTRASOUND ABDOMEN LIMITED RIGHT UPPER QUADRANT COMPARISON:  August 02, 2022 FINDINGS: Gallbladder: Echogenic material is seen dependently within the gallbladder lumen. Gallbladder wall is thickened to 5.6 mm. No Murphy's sign. Common bile duct: Diameter: 4.5 mm. Liver: Nodular contour with increased coarse echogenicity. Severely diminished flow to the proximal portal vein with no flow visualized to the distal main portal vein, right portal vein and left portal vein. Other: Moderate-sized ascites. IMPRESSION: Cirrhotic appearance of the liver with moderate ascites. Main portal vein thrombosis. Gallbladder sludge. Thickening of the gallbladder wall may be secondary to chronic liver disease/hypoalbuminemia. Acute cholecystitis in the absence of a sonographic Murphy's sign is less likely. These results will be called to the ordering clinician or representative by the Radiologist Assistant, and communication documented in the PACS or [employer]. Electronically Signed   By: Ted Mcalpine M.D.   On: 02/22/2023 12:29      Subjective: Overall feels better.  No  further nausea or vomiting.  Patient to me stated that he had not had a BM in the last 24 hours but to GI PA indicated that he had 7 bowel movements yesterday and 2 solid stools today.  Tolerating diet.  No dyspnea, cough or chest pain.  No dizziness or lightheadedness.  Discharge Exam:  Vitals:   02/27/23 2047 02/28/23 0450 02/28/23 0751 02/28/23 0752  BP: 132/82 109/70    Pulse: 98 99 96   Resp: 20 18    Temp: 98.4 F (36.9 C) 98.7 F (37.1 C)    TempSrc: Oral     SpO2: 96% 95%  98%  Weight:      Height:        General exam: Young male, moderately built and nourished and up comfortably in bed.  Marked scleral and skin icterus. Respiratory system: Clear to auscultation without wheezing, rhonchi or crackles.  Lungs have been clear to auscultation for 2 successive days now.  No increased work of breathing. Cardiovascular system: S1 & S2 heard, RRR. No JVD, murmurs, rubs, gallops or clicks. No pedal edema.  Telemetry personally reviewed: Sinus rhythm with BBB morphology.  Unchanged and stable. Gastrointestinal system: Abdomen is mildly distended, soft and nontender. No organomegaly or masses felt. Normal bowel sounds heard. Central nervous  system: Alert and oriented. No focal neurological deficits. Extremities: Symmetric 5 x 5 power. Skin: No rashes, lesions or ulcers Psychiatry: Judgement and insight appear normal. Mood & affect appropriate.     The results of significant diagnostics from this hospitalization (including imaging, microbiology, ancillary and laboratory) are listed below for reference.     Microbiology: Recent Results (from the past 240 hour(s))  Gram stain     Status: None   Collection Time: 02/23/23 10:10 AM   Specimen: Peritoneal Cavity  Result Value Ref Range Status   Specimen Description PERITONEAL CAVITY  Final   Special Requests NONE  Final   Gram Stain   Final    NO ORGANISMS SEEN NO WBC SEEN CYTOSPIN SMEAR Performed at [facility], [address] [place], [place] [address]    Report Status 02/23/2023 FINAL  Final  Culture, body fluid w Gram Stain-bottle     Status: None   Collection Time: 02/23/23 10:10 AM   Specimen: Peritoneal Cavity  Result Value Ref Range Status   Specimen Description PERITONEAL CAVITY  Final   Special Requests   Final    BOTTLES DRAWN AEROBIC AND ANAEROBIC Blood Culture adequate volume   Culture   Final    NO GROWTH 5 DAYS Performed at [facility], [address] [place], [place] [address]    Report Status 02/28/2023 FINAL  Final     Labs: CBC: Recent Labs  Lab 02/22/23 0905 02/22/23 1130 02/24/23 0404 02/25/23 0414 02/26/23 0546 02/27/23 0418 02/28/23 0513  WBC 12.7*   < > 7.0 8.7 10.3 11.4* 12.1*  NEUTROABS 9.0*  --   --   --   --   --   --   HGB 11.5*   < > 8.2* 10.0* 8.5* 8.2* 8.6*  HCT 32.1*   < > 23.4* 29.6* 24.9* 24.6* 25.4*  MCV 99.7   < > 105.4* 109.6* 108.7* 110.8* 110.4*  PLT 110*   < > 71* 84* 77* 81* 81*   < > = values in this interval not displayed.    Basic Metabolic Panel: Recent Labs  Lab 02/24/23 0404 02/24/23 1152 02/25/23 0414 02/26/23 0546 02/27/23 0418 02/28/23 0513  NA 129*  --  131* 130* 127* 127*  K 2.6*  --  3.6 3.9 5.0 4.9  CL 93*  --  97* 97* 96* 98  CO2 27  --  28 25 23  21*  GLUCOSE 247*  --  226* 239* 303* 306*  BUN 11  --  8 6 8 10   CREATININE 0.76  --  0.80 0.72 0.78 0.69  CALCIUM 7.8*  --  8.3* 8.3* 8.2* 8.3*  MG  --  1.8 1.7  --   --   --   PHOS  --   --  2.1* 2.7  --   --     Liver Function Tests: Recent Labs  Lab 02/24/23 1152 02/25/23 0414 02/26/23 0546 02/27/23 0418 02/28/23 0513  AST 113* 136* 117* 110* 95*  ALT 36 44 42 41 42  ALKPHOS 120 152* 168* 144* 140*  BILITOT 9.3* 8.5* 5.9* 6.4* 5.9*  PROT 5.1* 5.9* 5.2* 5.5* 5.4*  ALBUMIN 2.2* 2.6* 2.3* 2.3* 2.2*    CBG: Recent Labs  Lab 02/27/23 1123 02/27/23 1610 02/27/23 2050 02/28/23 0719 02/28/23 1134  GLUCAP 196* 259* 228* 254* 195*     Urinalysis    Component  Value Date/Time   COLORURINE AMBER (A) 02/22/2023 1645   APPEARANCEUR CLEAR 02/22/2023 1645  LABSPEC 1.015 02/22/2023 1645   PHURINE 5.0 02/22/2023 1645   GLUCOSEU NEGATIVE 02/22/2023 1645   HGBUR NEGATIVE 02/22/2023 1645   BILIRUBINUR NEGATIVE 02/22/2023 1645   BILIRUBINUR negative 07/01/2020 1608   KETONESUR NEGATIVE 02/22/2023 1645   PROTEINUR NEGATIVE 02/22/2023 1645   UROBILINOGEN 0.2 07/01/2020 1608   NITRITE NEGATIVE 02/22/2023 1645   LEUKOCYTESUR NEGATIVE 02/22/2023 1645      Time coordinating discharge: 25 minutes  SIGNED:  Marcellus Scott, MD,  FACP, FHM, SFHM, [facility], [facility]   [facility] Hospitalist & Physician Advisor [place]      To contact the attending provider between 7A-7P or the covering provider during after hours 7P-7A, please log into the web site [email or web address] and access using universal [place] password for that web site. If you do not have the password, please call the hospital operator.

## 2023-02-28 NOTE — Consult Note (Signed)
[facility] Customer service manager [facility]) Accountable Care Organization (ACO) [facility] Liaison Note  02/28/2023  Shawn Velazquez [birth year] [id number]  Location: [facility] RN Hospital Liaison screened the patient remotely at [facility].  Insurance: [employer]   Shawn Velazquez is a 57 y.o. male who is a Primary Care Patient of Margo Aye, Kathleene Hazel, MD. The patient was screened for readmission hospitalization with noted extreme risk score for unplanned readmission risk with 4 IP in 6 months.  The patient was assessed for potential [facility]) Care Management service needs for post hospital transition for care coordination. Review of patient's electronic medical record reveals patient was admitted for GI Bleed. Liaison spoke with pt concerning care management services and pt was receptive to a post hospital prevention readmission follow up with a nurse care coordinator.    Plan: [facility] Liaison will continue to follow progress and disposition to asess for post hospital community care coordination/management needs.  Referral request for community care coordination: Liaison will make a referral for services for community care coordination.    [facility] Care Management/Population Health does not replace or interfere with any arrangements made by the Inpatient Transition of Care team.   For questions contact:   Elliot Cousin, RN, [facility] Liaison [facility]   Population Health Office Hours MTWF  8:00 am-6:00 pm [phone] mobile [phone] [Office toll free line] Office Hours are M-F 8:30 - 5 pm [email or web address]

## 2023-02-28 NOTE — Progress Notes (Signed)
Gastroenterology Progress Note     Patient ID: Shawn Velazquez; [id number]; 04-24-1966    Subjective   7 bowel movements yesterday. Today has had 2 solid stools. No mental status changes or confusion. No overt GI bleeding. Tolerating diet. No N/V.    Objective   Vital signs in last 24 hours Temp:  [98.4 F (36.9 C)-98.8 F (37.1 C)] 98.7 F (37.1 C) (08/26 0450) Pulse Rate:  [96-107] 96 (08/26 0751) Resp:  [16-20] 18 (08/26 0450) BP: (109-132)/(70-83) 109/70 (08/26 0450) SpO2:  [89 %-98 %] 98 % (08/26 0752) Last BM Date : 02/26/23  Physical Exam General:   Alert and oriented, pleasant, sallow-appearing Eyes:  with scleral icterus  Abdomen:  Bowel sounds present, soft, non-tender, non-distended. Small umbilical hernia Extremities:  With mild right ankle edema, no calf edema bilaterally Neurologic:  Alert and  oriented x4;  negative asterixis   Intake/Output from previous day: 08/25 0701 - 08/26 0700 In: 960 [P.O.:960] Out: -  Intake/Output this shift: No intake/output data recorded.  Lab Results  Recent Labs    02/26/23 0546 02/27/23 0418 02/28/23 0513  WBC 10.3 11.4* 12.1*  HGB 8.5* 8.2* 8.6*  HCT 24.9* 24.6* 25.4*  PLT 77* 81* 81*   BMET Recent Labs    02/26/23 0546 02/27/23 0418 02/28/23 0513  NA 130* 127* 127*  K 3.9 5.0 4.9  CL 97* 96* 98  CO2 25 23 21*  GLUCOSE 239* 303* 306*  BUN 6 8 10   CREATININE 0.72 0.78 0.69  CALCIUM 8.3* 8.2* 8.3*   LFT Recent Labs    02/26/23 0546 02/27/23 0418 02/28/23 0513  PROT 5.2* 5.5* 5.4*  ALBUMIN 2.3* 2.3* 2.2*  AST 117* 110* 95*  ALT 42 41 42  ALKPHOS 168* 144* 140*  BILITOT 5.9* 6.4* 5.9*   Lab Results  Component Value Date   INR 1.8 (H) 02/25/2023   INR 2.0 (H) 02/24/2023   INR 2.3 (H) 02/23/2023    Studies/Results DG Chest 2 View  Result Date: 02/26/2023 CLINICAL DATA:  COPD.  Left pleural effusion.  Cirrhosis. EXAM: CHEST - 2 VIEW COMPARISON:  02/22/2023 FINDINGS: The heart size  and mediastinal contours are within normal limits. Small left pleural effusion again seen with mild left basilar atelectasis. Right lung remains clear. IMPRESSION: Small left pleural effusion and mild left basilar atelectasis. Electronically Signed   By: Danae Orleans M.D.   On: 02/26/2023 12:00   CT ANGIO ABD/PELVIS BRTO  Result Date: 02/24/2023 CLINICAL DATA:  Hematemesis, portal vein thrombosis. History of cirrhosis and portal hypertension. EXAM: CTA ABDOMEN AND PELVIS WITHOUT AND WITH CONTRAST TECHNIQUE: Multidetector CT imaging of the abdomen and pelvis was performed using the standard protocol during bolus administration of intravenous contrast. Multiplanar reconstructed images and MIPs were obtained and reviewed to evaluate the vascular anatomy. RADIATION DOSE REDUCTION: This exam was performed according to the departmental dose-optimization program which includes automated exposure control, adjustment of the mA and/or kV according to patient size and/or use of iterative reconstruction technique. CONTRAST:  OMNIPAQUE IOHEXOL 350 MG/ML SOLN COMPARISON:  MRI of the abdomen 08/03/2022 FINDINGS: VASCULAR Aorta: Normal caliber aorta without aneurysm, dissection, vasculitis or significant stenosis. Mild scattered atherosclerotic vascular calcifications. Celiac: Patent without evidence of aneurysm, dissection, vasculitis or significant stenosis. SMA: Patent without evidence of aneurysm, dissection, vasculitis or significant stenosis. Renals: Both main renal arteries are patent without evidence of aneurysm, dissection, vasculitis, fibromuscular dysplasia or significant stenosis. Small accessory renal arteries are noted bilaterally. IMA: Patent  without evidence of aneurysm, dissection, vasculitis or significant stenosis. Inflow: Patent without evidence of aneurysm, dissection, vasculitis or significant stenosis. Proximal Outflow: Bilateral common femoral and visualized portions of the superficial and profunda  femoral arteries are patent without evidence of aneurysm, dissection, vasculitis or significant stenosis. Veins: The superior mesenteric vein, inferior mesenteric vein, main portal vein and intrahepatic right and left portal veins are all widely patent. Splenic vein is relatively small in caliber but remains patent. Significant hypertrophy of the left gastric vein resulting in gastroesophageal varices. Recanalized periumbilical vein. Review of the MIP images confirms the above findings. NON-VASCULAR Lower chest: Moderately large layering left pleural effusion. Associated atelectasis. Subpleural reticulation, and architectural distortion in the periphery of the visualized lungs consistent with pulmonary fibrosis/interstitial lung disease. The heart is normal in size. Esophageal varices are present. Hepatobiliary: Abnormal morphology of the liver. Hypertrophy of the left lobe and relative to the right with a micro nodular contour consistent with cirrhosis. No arterially enhancing mass. Small gallstones are present within the gallbladder lumen. No significant biliary ductal dilatation. Pancreas: Unremarkable. No pancreatic ductal dilatation or surrounding inflammatory changes. Spleen: Normal in size without focal abnormality. Adrenals/Urinary Tract: Unremarkable adrenal glands. No hydronephrosis, nephrolithiasis or enhancing renal mass. Stomach/Bowel: No focal bowel wall thickening or evidence of obstruction. Lymphatic: No suspicious lymphadenopathy. Reproductive: Prostate is unremarkable. Other: Small volume ascites and mesenteric edema. Right inguinal hernia containing ascitic fluid and omental fat. Musculoskeletal: No acute fracture or aggressive appearing lytic or blastic osseous lesion. IMPRESSION: 1. Hepatic cirrhosis with evidence of portal hypertension (esophageal varices, small volume ascites and moderate left hepatic hydrothorax). 2. No evidence of portal venous thrombosis. 3. Hypertrophic left gastric vein  and short gastric veins result in moderate esophageal varices. 4.  Aortic Atherosclerosis (ICD10-I70.0). 5. Right inguinal hernia containing omental fat and ascitic fluid. 6. Cholelithiasis. Electronically Signed   By: Malachy Moan M.D.   On: 02/24/2023 06:20   ECHOCARDIOGRAM COMPLETE  Result Date: 02/23/2023    ECHOCARDIOGRAM REPORT   Patient Name:   ANDRIE KRYSINSKI Date of Exam: 02/23/2023 Medical Rec #:  [id number]         Height:       66.0 in Accession #:    [id number]        Weight:       172.8 lb Date of Birth:  1966/01/02        BSA:          1.880 m Patient Age:    56 years          BP:           95/54 mmHg Patient Gender: M                 HR:           71 bpm. [facility] Procedure: 2D Echo, Cardiac Doppler and Color Doppler Indications:    S/P TIPS (transjugular intrahepatic portosystemic shunt)                 [[id number]]  History:        Patient has prior history of Echocardiogram examinations, most                 recent 08/03/2022. Risk Factors:Hypertension, Diabetes and                 Dyslipidemia. Hx of Substance and Alcohol abuse.  Sonographer:    Celesta Gentile RCS Referring Phys: [phone] CARLOS MADERA IMPRESSIONS  1. Left ventricular ejection fraction, by estimation, is 70 to 75%. The left ventricle has hyperdynamic function. The left ventricle has no regional wall motion abnormalities. There is mild concentric left ventricular hypertrophy. Left ventricular diastolic parameters were normal.  2. Right ventricular systolic function is normal. The right ventricular size is normal. Tricuspid regurgitation signal is inadequate for assessing PA pressure.  3. Left atrial size was mildly dilated.  4. Apparent ascitic fluid visualized.  5. The mitral valve is grossly normal. Trivial mitral valve regurgitation.  6. The aortic valve is tricuspid. There is mild calcification of the aortic valve. Aortic valve regurgitation is not visualized. Aortic valve sclerosis is present, with no  evidence of aortic valve stenosis.  7. The inferior vena cava is normal in size with greater than 50% respiratory variability, suggesting right atrial pressure of 3 mmHg. Comparison(s): Prior images reviewed side by side. LVEF vigorous at 70-75%. FINDINGS  Left Ventricle: Left ventricular ejection fraction, by estimation, is 70 to 75%. The left ventricle has hyperdynamic function. The left ventricle has no regional wall motion abnormalities. The left ventricular internal cavity size was normal in size. There is mild concentric left ventricular hypertrophy. Left ventricular diastolic parameters were normal. Right Ventricle: The right ventricular size is normal. No increase in right ventricular wall thickness. Right ventricular systolic function is normal. Tricuspid regurgitation signal is inadequate for assessing PA pressure. Left Atrium: Left atrial size was mildly dilated. Right Atrium: Right atrial size was normal in size. Pericardium: Apparent ascitic fluid visualized. There is no evidence of pericardial effusion. Presence of epicardial fat layer. Mitral Valve: The mitral valve is grossly normal. Trivial mitral valve regurgitation. Tricuspid Valve: The tricuspid valve is grossly normal. Tricuspid valve regurgitation is trivial. Aortic Valve: The aortic valve is tricuspid. There is mild calcification of the aortic valve. There is mild aortic valve annular calcification. Aortic valve regurgitation is not visualized. Aortic valve sclerosis is present, with no evidence of aortic valve stenosis. Pulmonic Valve: The pulmonic valve was grossly normal. Pulmonic valve regurgitation is trivial. Aorta: The aortic root is normal in size and structure. Venous: The inferior vena cava is normal in size with greater than 50% respiratory variability, suggesting right atrial pressure of 3 mmHg. IAS/Shunts: No atrial level shunt detected by color flow Doppler.  LEFT VENTRICLE PLAX 2D LVIDd:         4.70 cm   Diastology LVIDs:          2.50 cm   LV e' medial:    9.03 cm/s LV PW:         1.20 cm   LV E/e' medial:  10.0 LV IVS:        1.00 cm   LV e' lateral:   9.25 cm/s LVOT diam:     2.00 cm   LV E/e' lateral: 9.8 LV SV:         71 LV SV Index:   38 LVOT Area:     3.14 cm  RIGHT VENTRICLE RV S prime:     14.30 cm/s TAPSE (M-mode): 2.8 cm LEFT ATRIUM             Index        RIGHT ATRIUM           Index LA diam:        4.10 cm 2.18 cm/m   RA Area:     17.60 cm LA Vol (A2C):   56.0 ml 29.79 ml/m  RA Volume:   49.30  ml  26.23 ml/m LA Vol (A4C):   69.9 ml 37.19 ml/m LA Biplane Vol: 64.4 ml 34.26 ml/m  AORTIC VALVE LVOT Vmax:   109.00 cm/s LVOT Vmean:  68.500 cm/s LVOT VTI:    0.226 m  AORTA Ao Root diam: 3.40 cm MITRAL VALVE MV Area (PHT): 3.48 cm    SHUNTS MV Decel Time: 218 msec    Systemic VTI:  0.23 m MV E velocity: 90.40 cm/s  Systemic Diam: 2.00 cm MV A velocity: 84.00 cm/s MV E/A ratio:  1.08 Nona Dell MD Electronically signed by Nona Dell MD Signature Date/Time: 02/23/2023/5:26:08 PM    Final    US Paracentesis  Result Date: 02/23/2023 INDICATION: Patient with history of ETOH cirrhosis, recurrent ascites. Currently admitted for esophageal variceal bleeding. Request for diagnostic and therapeutic paracentesis. EXAM: ULTRASOUND GUIDED DIAGNOSTIC AND THERAPEUTIC PARACENTESIS MEDICATIONS: 7 mL 1% lidocaine. COMPLICATIONS: None immediate. PROCEDURE: Informed written consent was obtained from the patient after a discussion of the risks, benefits and alternatives to treatment. A timeout was performed prior to the initiation of the procedure. Initial ultrasound scanning demonstrates a moderate amount of ascites within the right lower abdominal quadrant. The right lower abdomen was prepped and draped in the usual sterile fashion. 1% lidocaine was used for local anesthesia. Following this, a 19 gauge, 7-cm, Yueh catheter was introduced. An ultrasound image was saved for documentation purposes. The paracentesis was performed. The  catheter was removed and a dressing was applied. The patient tolerated the procedure well without immediate post procedural complication. Patient received post-procedure intravenous albumin; see nursing notes for details. FINDINGS: A total of approximately 3.3 L of clear yellow fluid was removed. Samples were sent to the laboratory as requested by the clinical team. IMPRESSION: Successful ultrasound-guided paracentesis yielding 3.3 liters of peritoneal fluid. Performed by Lynnette Caffey, PA-C Electronically Signed   By: Acquanetta Belling M.D.   On: 02/23/2023 13:27   DG Chest Port 1 View  Result Date: 02/22/2023 CLINICAL DATA:  100030 Leukocytosis 100030 97749 Alcoholic hepatitis 97749 EXAM: PORTABLE CHEST 1 VIEW COMPARISON:  Chest x-ray 08/02/2022, CT chest 03/13/2014 FINDINGS: The heart and mediastinal contours are unchanged. Biapical pleural/pulmonary scarring. Cystic changes of lung apices. Question left base airspace opacity that may represent atelectasis. Chronic coarsened markings with no overt pulmonary edema. No pleural effusion. No pneumothorax. No acute osseous abnormality. IMPRESSION: 1. Question left base airspace opacity that may represent atelectasis or eventration of the hemidiaphragm. Recommend repeat PA and lateral view of the chest. 2.  Emphysema (ICD10-J43.9). Electronically Signed   By: Tish Frederickson M.D.   On: 02/22/2023 20:32   US Abdomen Limited RUQ (LIVER/GB)  Result Date: 02/22/2023 CLINICAL DATA:  Upper abdominal pain and nausea. EXAM: ULTRASOUND ABDOMEN LIMITED RIGHT UPPER QUADRANT COMPARISON:  August 02, 2022 FINDINGS: Gallbladder: Echogenic material is seen dependently within the gallbladder lumen. Gallbladder wall is thickened to 5.6 mm. No Murphy's sign. Common bile duct: Diameter: 4.5 mm. Liver: Nodular contour with increased coarse echogenicity. Severely diminished flow to the proximal portal vein with no flow visualized to the distal main portal vein, right portal vein and  left portal vein. Other: Moderate-sized ascites. IMPRESSION: Cirrhotic appearance of the liver with moderate ascites. Main portal vein thrombosis. Gallbladder sludge. Thickening of the gallbladder wall may be secondary to chronic liver disease/hypoalbuminemia. Acute cholecystitis in the absence of a sonographic Murphy's sign is less likely. These results will be called to the ordering clinician or representative by the Radiologist Assistant, and communication documented in the  PACS or [employer]. Electronically Signed   By: Ted Mcalpine M.D.   On: 02/22/2023 12:29    Assessment  57 y.o. male with a history of alcoholic cirrhosis complicated by variceal bleeding, alcohol abuse, alcoholic hepatitis, and IDA following with hematology for IV iron infusions who presented with hematemesis and melena on 8/20.   Acute GI bleed: s/p EGD with Grade 2 esophageal varices and stigmata of recent bleeding s/p banding. Hgb remaining stable at 8.6. No overt GI bleeding. He has completed course of Rocephin and octreotide. Will need repeat EGD for surveillance in 4 weeks as outpatient.   Decompensated cirrhosis: with history of ascites, coagulopathy, varices with bleeding, para this admission without SBP, and with alcoholic hepatitis. Despite elevated DF, prednisolone not started due to history of noncompliance with steroids in the past. Current diuretic regimen including furosemide 20 mg and spironolactone 50 mg. Hyponatremia with sodium 127 although appears near his baseline. Multiple BMs yesterday on home dosing of lactulose but only 2 today. No signs of encephalopathy.    Due to concerns for PVT, he underwent CT Angio Abd/Pelvis BRTO this admission as part of workup for TIPS, showing no evidence of portal venous thrombosis, hypertrophic left gastric vein and short gastric veins result in moderate esophageal varices, cholelithiasis, hepatic cirrhosis with evidence of portal hypertension (esophageal varices,  small volume ascites and moderate left hepatic hydrothorax).      Plan / Recommendations  Check INR today to calculate MELD  Will need outpatient follow-up with GI and arrange EGD for variceal surveillance Continue lactulose and xifaxan Continue PPI daily Continue low dose diuretic therapy     LOS: 6 days    02/28/2023, 9:32 AM  Gelene Mink, PhD, ANP-BC [facility] Gastroenterology

## 2023-03-01 ENCOUNTER — Telehealth: Payer: Self-pay | Admitting: *Deleted

## 2023-03-01 NOTE — Progress Notes (Signed)
  Care Coordination   Note   03/01/2023 Name: Shawn Velazquez MRN: [id number] DOB: [birth year]  Shawn Velazquez is a 57 y.o. year old male who sees Margo Aye, Kathleene Hazel, MD for primary care. I reached out to Delsa Bern by phone today to offer care coordination services.  [name] was given information about Care Coordination services today including:   The Care Coordination services include support from the care team which includes your Nurse Coordinator, Clinical Social Worker, or Pharmacist.  The Care Coordination team is here to help remove barriers to the health concerns and goals most important to you. Care Coordination services are voluntary, and the patient may decline or stop services at any time by request to their care team member.   Care Coordination Consent Status: Patient agreed to services and verbal consent obtained.   Follow up plan:  Telephone appointment with care coordination team member scheduled for:  03/02/23  Encounter Outcome:  Pt. Scheduled  [facility] Coordination Care Guide  Direct Dial: [phone]

## 2023-03-01 NOTE — Telephone Encounter (Signed)
Will call once we get oct schedule

## 2023-03-02 ENCOUNTER — Ambulatory Visit: Payer: Self-pay | Admitting: *Deleted

## 2023-03-02 NOTE — Patient Outreach (Signed)
  Care Coordination   Initial Visit Note   03/02/2023 Name: Shawn Velazquez MRN: [id number] DOB: Dec 08, 1965  Shawn Velazquez is a 57 y.o. year old male who sees Margo Aye, Kathleene Hazel, MD for primary care. I spoke with  Shawn Velazquez by phone today.  What matters to the patients health and wellness today?  GI bleed-no further bleeding  Hospitalized for 6 days recent discharged on 02/28/23 Requesting assistance with Friday appointments around 1000 with pcp & Gastroenterologist,  Diabetes-  03/01/23 cbg drop to 53 sweating short of breath red in face -was drinking Gatorade cbg up to 100 He states he will start keeping peanut butter crackers with him also    Reports some anxiety after smoke cessation and decreasing intake of alcohol. 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 He has the contact information. His girlfriend [name] will assist with transportation.  He understands the need to check with his insurance customer service to discuss any co pays, benefit information    Goals Addressed             This Visit's Progress    attend hospital follow up visits, attend substance abuse center, manage diabetes (RN care coordination services)   Not on track    Interventions Today    Flowsheet Row Most Recent Value  Chronic Disease   Chronic disease during today's visit Diabetes, Hypertension (HTN), Other  [GI bleed, follow up appointments, substance abuse treatment]  General Interventions   General Interventions Discussed/Reviewed General Interventions Discussed, Programmer, applications, Doctor Visits, Communication with  Doctor Visits Discussed/Reviewed Doctor Visits Discussed, PCP, Specialist  PCP/Specialist Visits Compliance with follow-up visit  Communication with PCP/Specialists   Pharmacy Interventions   Pharmacy Dicussed/Reviewed Pharmacy Topics Discussed, Medications and their functions, Affording Medications              SDOH assessments and interventions completed:  Yes  SDOH Interventions Today    Flowsheet Row Most Recent Value  SDOH Interventions   Food Insecurity Interventions Intervention Not Indicated  Transportation Interventions Intervention Not Indicated  Utilities Interventions Intervention Not Indicated  Depression Interventions/Treatment  Medication, Counseling, Currently on Treatment  Financial Strain Interventions Intervention Not Indicated  Health Literacy Interventions Intervention Not Indicated        Care Coordination Interventions:  Yes, provided   Follow up plan: Follow up call scheduled for 03/16/23    Encounter Outcome:  Pt. Visit Completed   Damaria Stofko L. Noelle Penner, RN, BSN, CCM [facility] Care Management Community Coordinator Office number [phone]

## 2023-03-02 NOTE — Patient Instructions (Addendum)
Visit Information  Thank you for taking time to visit with me today. Please don't hesitate to contact me if I can be of assistance to you.   Following are the goals we discussed today:   Goals Addressed             This Visit's Progress    attend hospital follow up visits, attend substance abuse center, manage diabetes (RN care coordination services)   Not on track    Interventions Today    Flowsheet Row Most Recent Value  Chronic Disease   Chronic disease during today's visit Diabetes, Hypertension (HTN), Other  [GI bleed, follow up appointments, substance abuse treatment]  General Interventions   General Interventions Discussed/Reviewed General Interventions Discussed, [employer], Doctor Visits, Communication with  Doctor Visits Discussed/Reviewed Doctor Visits Discussed, PCP, Specialist  PCP/Specialist Visits Compliance with follow-up visit  Communication with PCP/Specialists  [spoke with Noreene Larsson at [employer] office. Obtained-Friday Sept 6 2024 1045 pcp hospital follow up  Unable to reach GI staff, left a message for post hospitaldischarge appointment Left patient info & number for a return call to him PreferringFriday 10-12 appointment]  Education Interventions   Education Provided Provided Education, Provided Web-based Education  Provided Verbal Education On Blood Sugar Monitoring, Mental Health/Coping with Illness, Medication, Sick Day Rules, [facility], Development worker, community  [encouraged him to call insurance for substance abuse benefit, co pay etc. Helped to confirm from the online site that [facility] is listed to be in network Discussed the use of protein to increase & maintain cbg values,Confirmed no med issue]  Mental Health Interventions   Mental Health Discussed/Reviewed Mental Health Discussed, Coping Strategies, Anxiety, Depression, Other, Substance Abuse  [encouraged him to outreach to [facility] [place]]  Nutrition Interventions    Nutrition Discussed/Reviewed Nutrition Discussed, Increasing proteins  Pharmacy Interventions   Pharmacy Dicussed/Reviewed Pharmacy Topics Discussed, Medications and their functions, Affording Medications  Advanced Directive Interventions   Advanced Directives Discussed/Reviewed Advanced Directives Discussed, Advanced Care Planning  [on chart]              Our next appointment is by telephone on 03/16/23 at 1100  Please call the care guide team at [phone] if you need to cancel or reschedule your appointment.   If you are experiencing a Mental Health or Behavioral Health Crisis or need someone to talk to, please call the Suicide and Crisis Lifeline: 988 call the [place] National Suicide Prevention Lifeline: 7731596132 or TTY: 903-505-1825 TTY (484)191-6932) to talk to a trained counselor call 1-800-273-TALK (toll free, 24 hour hotline) call the [facility]: [phone] call 911   Patient verbalizes understanding of instructions and care plan provided today and agrees to view in MyChart. Active MyChart status and patient understanding of how to access instructions and care plan via MyChart confirmed with patient.     The patient has been provided with contact information for the care management team and has been advised to call with any health related questions or concerns.   Peggy Loge L. Noelle Penner, RN, BSN, CCM [facility] Care Management Community Coordinator Office number [phone]

## 2023-03-03 ENCOUNTER — Telehealth: Payer: Self-pay | Admitting: Gastroenterology

## 2023-03-03 ENCOUNTER — Encounter (HOSPITAL_COMMUNITY): Payer: Self-pay | Admitting: Gastroenterology

## 2023-03-03 NOTE — Telephone Encounter (Signed)
A representative from the hospital called and said the patient needed to make a follow up appointment.  I called the patient and he was under the impression he was to make the appointment with Dr. Margo Aye.  I called Dr. Scharlene Gloss office and was told that he did need an appointment there and they have him scheduled but she thought he might need a follow up with [place] to since he was in the hospital for a GI bleed.    Does he need a follow up appt here?

## 2023-03-11 DIAGNOSIS — F419 Anxiety disorder, unspecified: Secondary | ICD-10-CM | POA: Diagnosis not present

## 2023-03-11 DIAGNOSIS — I8511 Secondary esophageal varices with bleeding: Secondary | ICD-10-CM | POA: Diagnosis not present

## 2023-03-11 DIAGNOSIS — K7460 Unspecified cirrhosis of liver: Secondary | ICD-10-CM | POA: Diagnosis not present

## 2023-03-11 DIAGNOSIS — K7031 Alcoholic cirrhosis of liver with ascites: Secondary | ICD-10-CM | POA: Diagnosis not present

## 2023-03-11 DIAGNOSIS — K922 Gastrointestinal hemorrhage, unspecified: Secondary | ICD-10-CM | POA: Diagnosis not present

## 2023-03-11 DIAGNOSIS — J441 Chronic obstructive pulmonary disease with (acute) exacerbation: Secondary | ICD-10-CM | POA: Diagnosis not present

## 2023-03-15 ENCOUNTER — Other Ambulatory Visit: Payer: Self-pay | Admitting: Interventional Radiology

## 2023-03-15 DIAGNOSIS — K7031 Alcoholic cirrhosis of liver with ascites: Secondary | ICD-10-CM

## 2023-03-16 ENCOUNTER — Ambulatory Visit: Payer: Self-pay | Admitting: *Deleted

## 2023-03-16 NOTE — Patient Outreach (Signed)
  Care Coordination   03/16/2023 Name: ALESSIO MCQUILLIN MRN: [id number] DOB: [birth year]   Care Coordination Outreach Attempts:  An unsuccessful telephone outreach was attempted for a scheduled appointment today.  Follow Up Plan:  Additional outreach attempts will be made to offer the patient care coordination information and services.   Encounter Outcome:  No Answer   Care Coordination Interventions:  No, not indicated    Griselda Bramblett L. Noelle Penner, RN, BSN, CCM, Care Management Coordinator [phone]

## 2023-03-21 ENCOUNTER — Ambulatory Visit: Payer: BC Managed Care – PPO | Attending: Internal Medicine | Admitting: Internal Medicine

## 2023-03-21 ENCOUNTER — Encounter: Payer: Self-pay | Admitting: Internal Medicine

## 2023-03-21 VITALS — BP 108/62 | HR 65 | Ht 66.0 in | Wt 202.4 lb

## 2023-03-21 DIAGNOSIS — I4581 Long QT syndrome: Secondary | ICD-10-CM | POA: Diagnosis not present

## 2023-03-21 NOTE — Patient Instructions (Addendum)
Medication Instructions:  Your physician has recommended you make the following change in your medication:   -Start Magnesium Taurate- You can buy this over the counter.   *If you need a refill on your cardiac medications before your next appointment, please call your pharmacy*   Lab Work: None If you have labs (blood work) drawn today and your tests are completely normal, you will receive your results only by: MyChart Message (if you have MyChart) OR A paper copy in the mail If you have any lab test that is abnormal or we need to change your treatment, we will call you to review the results.   Testing/Procedures: None   Follow-Up: At [facility], you and your health needs are our priority.  As part of our continuing mission to provide you with exceptional heart care, we have created designated Provider Care Teams.  These Care Teams include your primary Cardiologist (physician) and Advanced Practice Providers (APPs -  Physician Assistants and Nurse Practitioners) who all work together to provide you with the care you need, when you need it.  We recommend signing up for the patient portal called "MyChart".  Sign up information is provided on this After Visit Summary.  MyChart is used to connect with patients for Virtual Visits (Telemedicine).  Patients are able to view lab/test results, encounter notes, upcoming appointments, etc.  Non-urgent messages can be sent to your provider as well.   To learn more about what you can do with MyChart, go to [email or web address].    Your next appointment:   1 year(s)  Provider:   You may see Vishnu P Mallipeddi, MD or one of the following Advanced Practice Providers on your designated Care Team:   [place], PA-C  Jacolyn Reedy, [place]     Other Instructions

## 2023-03-23 ENCOUNTER — Encounter: Payer: Self-pay | Admitting: Internal Medicine

## 2023-03-23 ENCOUNTER — Ambulatory Visit (INDEPENDENT_AMBULATORY_CARE_PROVIDER_SITE_OTHER): Payer: BC Managed Care – PPO | Admitting: Internal Medicine

## 2023-03-23 ENCOUNTER — Ambulatory Visit (HOSPITAL_COMMUNITY)
Admission: RE | Admit: 2023-03-23 | Discharge: 2023-03-23 | Disposition: A | Discharge status: Home / Self Care | Payer: BC Managed Care – PPO | Source: Ambulatory Visit | Attending: Internal Medicine | Admitting: Internal Medicine

## 2023-03-23 ENCOUNTER — Encounter (HOSPITAL_COMMUNITY): Payer: Self-pay

## 2023-03-23 VITALS — BP 116/72 | HR 72 | Temp 98.1°F | Resp 16

## 2023-03-23 VITALS — BP 112/76 | HR 68 | Temp 97.1°F | Ht 66.0 in | Wt 201.3 lb

## 2023-03-23 DIAGNOSIS — I8500 Esophageal varices without bleeding: Secondary | ICD-10-CM | POA: Diagnosis not present

## 2023-03-23 DIAGNOSIS — R140 Abdominal distension (gaseous): Secondary | ICD-10-CM | POA: Diagnosis not present

## 2023-03-23 DIAGNOSIS — R188 Other ascites: Secondary | ICD-10-CM | POA: Diagnosis not present

## 2023-03-23 DIAGNOSIS — K7031 Alcoholic cirrhosis of liver with ascites: Secondary | ICD-10-CM

## 2023-03-23 DIAGNOSIS — D649 Anemia, unspecified: Secondary | ICD-10-CM | POA: Diagnosis not present

## 2023-03-23 DIAGNOSIS — K766 Portal hypertension: Secondary | ICD-10-CM

## 2023-03-23 LAB — BODY FLUID CELL COUNT WITH DIFFERENTIAL
Eos, Fluid: 0 %
Lymphs, Fluid: 64 %
Monocyte-Macrophage-Serous Fluid: 36 % — ABNORMAL LOW (ref 50–90)
Neutrophil Count, Fluid: 0 % (ref 0–25)
Total Nucleated Cell Count, Fluid: 114 uL (ref 0–1000)

## 2023-03-23 LAB — GRAM STAIN

## 2023-03-23 MED ORDER — LIDOCAINE HCL (PF) 2 % IJ SOLN
INTRAMUSCULAR | Status: AC
  Filled 2023-03-23: qty 20

## 2023-03-23 MED ORDER — SPIRONOLACTONE 50 MG PO TABS: 100.0000 mg | ORAL_TABLET | Freq: Every day | ORAL | 11 refills | Status: DC

## 2023-03-23 MED ORDER — ALBUMIN HUMAN 25 % IV SOLN
INTRAVENOUS | Status: AC
  Filled 2023-03-23: qty 100

## 2023-03-23 MED ORDER — FUROSEMIDE 40 MG PO TABS: 40.0000 mg | ORAL_TABLET | Freq: Every day | ORAL | 11 refills | Status: DC

## 2023-03-23 MED ORDER — ALBUMIN HUMAN 25 % IV SOLN
0.0000 g | Freq: Once | INTRAVENOUS | Status: AC
  Administered 2023-03-23: 25 g via INTRAVENOUS
  Filled 2023-03-23: qty 400

## 2023-03-23 NOTE — Progress Notes (Signed)
Referring Provider: [name], MD Primary Care Physician:  [name], MD Primary GI:  Dr. Marletta Lor  Chief Complaint  Patient presents with   Follow-up    Patient here today for a follow up visit. He says he been having issues with abdominal swelling, shortness of breath. He is on lasix 20 mg daily and spironolactone 50 mg daily, He takes lactulose Patient is taking 15 ml bid.He is taking xifaxan 550 bid. Last paracentesis was done while he was inpatient.    HPI:   Shawn Velazquez is a 57 y.o. male who presents to the clinic today for hospital follow-up visit.  He has a history of alcoholic hepatitis, decompensated alcohol cirrhosis complicated by portal hypertension, ascites, esophageal varices status post multiple bleeds with subsequent EVL, hepatic encephalopathy.  Admitted to [facility] 02/22/2023 after presenting with hematemesis.  EGD with grade 2 esophageal varices noted, stigmata of recent bleeding, banding x 3.  Completed course of Rocephin, octreotide, IV PPI.  Today:  Decompensated alcohol cirrhosis/alcoholic hepatitis: MELD during hospital stay 29. MDF significantly elevated though steroids not started due to history of noncompliance prior and ongoing alcohol use.  Patient states he continues to drink a few cans of beer weekly.  Ascites/lower extremity edema: Currently taking Lasix 20 mg daily, spironolactone 50 mg daily.  Notes increased swelling in his abdomen and lower extremities.  Requesting paracentesis.  Esophageal varices s/p EVL: Denies any further melena, no nausea or vomiting.  On carvedilol 3.125 mg twice daily.  Needs to schedule surveillance EGD today.  Hepatic encephalopathy: At baseline, well-controlled on lactulose and Xifaxan.  HCC screening: CT abdomen pelvis 02/23/2023 cirrhotic findings, no hepatoma.  Procedure history:  EGD 08/26/2022: -grade 2 esophageal varices s/p banding x 3 with complete eradication, without bleeding -Single  nonbleeding angiodysplastic lesion in the duodenum.  -Advised Carafate suspension 1 g before meals and nightly -Repeat EGD in 4-8 weeks.  EGD 09/24/22: -4 columns of grade 2-3 esophageal varices, cherry red spots 1 on each of 2 columns without active bleeding -Portal hypertensive gastropathy with touch friability, no gastric varices -Microvasive 7 shot Bander patch -1 band placed on each column with good hemostasis -Advise repeat banding in 4 weeks. -Continue PPI  EGD 02/22/2023: -3 columns of grade esophageal varices, without active bleeding, scarring from prior banding s/p banding x3 -Portal hypertensive gastropathy, no gastric varices   Past Medical History:  Diagnosis Date   Anxiety    Arthritis    affects hands, shoulder, neck, knees, hips, ankles, toes   Chronic back pain    Diabetes mellitus    diet controlled   Eczema    Esophageal varices (HCC)    Fatty liver, alcoholic    GERD (gastroesophageal reflux disease)    Glaucoma    slight case   Hypertension    Iron deficiency anemia due to chronic blood loss 11/25/2022   Psoriasis    Sleep apnea    Substance abuse (HCC)    Vertigo 09/11/2017   Wears glasses     Past Surgical History:  Procedure Laterality Date   COLONOSCOPY WITH PROPOFOL N/A 04/05/2022   Procedure: COLONOSCOPY WITH PROPOFOL;  Surgeon: Lanelle Bal, DO;  [facility];  Service: Endoscopy;  Laterality: N/A;  10:45 AM,unable to reach pt to move up   ESOPHAGEAL BANDING N/A 08/26/2022   Procedure: ESOPHAGEAL BANDING;  Surgeon: Lanelle Bal, DO;  [facility];  Service: Endoscopy;  Laterality: N/A;  ESOPHAGEAL BANDING  09/24/2022   Procedure: ESOPHAGEAL BANDING;  Surgeon: Corbin Ade, MD;  [facility];  Service: Endoscopy;;   ESOPHAGEAL BANDING  02/22/2023   Procedure: ESOPHAGEAL BANDING;  Surgeon: Dolores Frame, MD;  [facility];  Service: Gastroenterology;;    ESOPHAGOGASTRODUODENOSCOPY (EGD) WITH PROPOFOL N/A 08/26/2022   Procedure: ESOPHAGOGASTRODUODENOSCOPY (EGD) WITH PROPOFOL;  Surgeon: Lanelle Bal, DO;  [facility];  Service: Endoscopy;  Laterality: N/A;   ESOPHAGOGASTRODUODENOSCOPY (EGD) WITH PROPOFOL N/A 09/24/2022   Procedure: ESOPHAGOGASTRODUODENOSCOPY (EGD) WITH PROPOFOL;  Surgeon: Corbin Ade, MD;  [facility];  Service: Endoscopy;  Laterality: N/A;  hematemesis, esophageal varices, melena   ESOPHAGOGASTRODUODENOSCOPY (EGD) WITH PROPOFOL N/A 02/22/2023   Procedure: ESOPHAGOGASTRODUODENOSCOPY (EGD) WITH PROPOFOL;  Surgeon: Dolores Frame, MD;  [facility];  Service: Gastroenterology;  Laterality: N/A;   MULTIPLE TOOTH EXTRACTIONS     POLYPECTOMY  04/05/2022   Procedure: POLYPECTOMY;  Surgeon: Lanelle Bal, DO;  [facility];  Service: Endoscopy;;   POSTERIOR CERVICAL LAMINECTOMY  04/21/2012   Procedure: POSTERIOR CERVICAL LAMINECTOMY;  Surgeon: Mariam Dollar, MD;  Location: [facility];  Service: Neurosurgery;  Laterality: Left;  Posterior Cervical laminectomy/foraminotomy and diskectomy, left cervical seven-thoracic one   ROTATOR CUFF REPAIR     THORACIC DISCECTOMY Left 04/03/2014   Procedure: Left Thoracic Seven to Eight, Thoracic Eight to Nine Thoracic Discectomy ;  Surgeon: Mariam Dollar, MD;  Location: [facility];  Service: Neurosurgery;  Laterality: Left;    Current Outpatient Medications  Medication Sig Dispense Refill   albuterol (VENTOLIN HFA) 108 (90 Base) MCG/ACT inhaler Inhale 2 puffs into the lungs every 6 (six) hours as needed for wheezing or shortness of breath. 8 g 2   busPIRone (BUSPAR) 5 MG tablet Take 1 tablet (5 mg total) by mouth daily as needed (anxiety).     carvedilol (COREG) 3.125 MG tablet Take 1 tablet (3.125 mg total) by mouth 2 (two) times daily with a meal. 60 tablet 2   Continuous Glucose Sensor (DEXCOM G7 SENSOR) MISC Inject 1 application  into  the skin as directed. Change sensor every 10 days as directed. 9 each 3   feeding supplement (ENSURE ENLIVE / ENSURE PLUS) LIQD Take 237 mLs by mouth 2 (two) times daily between meals. 237 mL 12   ferrous sulfate 324 MG TBEC Take 1 tablet (324 mg total) by mouth daily with breakfast. 30 tablet 3   FLUoxetine (PROZAC) 10 MG capsule Take 1 capsule (10 mg total) by mouth 2 (two) times daily.     folic acid (FOLVITE) 1 MG tablet TAKE 1 TABLET BY MOUTH DAILY 30 tablet 5   gabapentin (NEURONTIN) 100 MG capsule Take 1 tablet by mouth daily.     glucose blood test strip Use as instructed to monitor glucose 4 times daily 100 each 12   insulin glargine (LANTUS SOLOSTAR) 100 UNIT/ML Solostar Pen Inject 30 Units into the skin at bedtime. 30 mL 3   insulin lispro (HUMALOG KWIKPEN) 100 UNIT/ML KwikPen Inject 5-11 Units into the skin 3 (three) times daily. 28 mL 3   Insulin Pen Needle (PEN NEEDLES) 32G X 4 MM MISC 1 each by Does not apply route in the morning, at noon, in the evening, and at bedtime. Use to inject insulin 4 times daily 200 each 3   lactulose (CHRONULAC) 10 GM/15ML solution Take 30 mLs (20 g total) by mouth 2 (two) times daily.  946 mL 2   levocetirizine (XYZAL) 5 MG tablet Take 1 tablet by mouth daily.     MAGNESIUM PO Take by mouth. daily     midodrine (PROAMATINE) 5 MG tablet Take 1 tablet (5 mg total) by mouth 3 (three) times daily with meals. To help Prevent Low Blood Pressure (Patient taking differently: Take 5 mg by mouth 2 (two) times daily with a meal. To help Prevent Low Blood Pressure) 90 tablet 3   Multiple Vitamin (MULTIVITAMIN) tablet Take 1 tablet by mouth daily.     ondansetron (ZOFRAN-ODT) 4 MG disintegrating tablet Take 1 tablet (4 mg total) by mouth every 8 (eight) hours as needed for nausea or vomiting. 30 tablet 1   pantoprazole (PROTONIX) 40 MG tablet Take 1 tablet (40 mg total) by mouth daily. 30 tablet 1   thiamine (VITAMIN B-1) 100 MG tablet Take 1 tablet (100 mg total) by  mouth daily. 30 tablet 3   XIFAXAN 550 MG TABS tablet TAKE 1 TABLET BY MOUTH TWICE DAILY 60 tablet 11   furosemide (LASIX) 40 MG tablet Take 1 tablet (40 mg total) by mouth daily. 30 tablet 11   spironolactone (ALDACTONE) 50 MG tablet Take 2 tablets (100 mg total) by mouth daily. 60 tablet 11   No current facility-administered medications for this visit.    Allergies as of 03/23/2023 - Review Complete 03/23/2023  Allergen Reaction Noted   Neosporin + pain relief max st [neomy-bacit-polymyx-pramoxine] Rash 04/19/2012    Family History  Problem Relation Age of Onset   Diabetes Father    Cancer - Lung Father    Hypertension Other    Cancer - Lung Other     Social History   Socioeconomic History   Marital status: Divorced    Spouse name: Not on file   Number of children: Not on file   Years of education: Not on file   Highest education level: Not on file  Occupational History    Comment: Truck to storage  Tobacco Use   Smoking status: Every Day    Current packs/day: 0.00    Average packs/day: 0.3 packs/day for 40.0 years (10.0 ttl pk-yrs)    Types: Cigarettes    Start date: 05/09/1982    Last attempt to quit: 05/09/2022    Years since quitting: 0.8   Smokeless tobacco: Never   Tobacco comments:    pt trying to quit on his own  Vaping Use   Vaping status: Former  Substance and Sexual Activity   Alcohol use: Not Currently    Alcohol/week: 12.0 standard drinks of alcohol    Types: 12 Cans of beer per week    Comment: last drink 2 beers on Sunday 02/20/2023   Drug use: No   Sexual activity: Yes    Birth control/protection: None  Other Topics Concern   Not on file  Social History Narrative   Not on file   Social Determinants of Health   Financial Resource Strain: Low Risk  (03/02/2023)   Overall Financial Resource Strain (CARDIA)    Difficulty of Paying Living Expenses: Not very hard  Food Insecurity: No Food Insecurity (03/02/2023)   Hunger Vital Sign    Worried  About Running Out of Food in the Last Year: Never true    Ran Out of Food in the Last Year: Never true  Transportation Needs: No Transportation Needs (03/02/2023)   PRAPARE - Administrator, Civil Service (Medical): No    Lack of Transportation (Non-Medical):  No  Physical Activity: Not on file  Stress: Stress Concern Present (10/07/2022)   [employer] of Occupational Health - Occupational Stress Questionnaire    Feeling of Stress : To some extent  Social Connections: Not on file    Subjective: Review of Systems  Constitutional:  Negative for chills and fever.  HENT:  Negative for congestion and hearing loss.   Eyes:  Negative for blurred vision and double vision.  Respiratory:  Negative for cough and shortness of breath.   Cardiovascular:  Negative for chest pain and palpitations.  Gastrointestinal:  Positive for abdominal pain. Negative for blood in stool, constipation, diarrhea, heartburn, melena and vomiting.  Genitourinary:  Negative for dysuria and urgency.  Musculoskeletal:  Negative for joint pain and myalgias.  Skin:  Negative for itching and rash.  Neurological:  Negative for dizziness and headaches.  Psychiatric/Behavioral:  Negative for depression. The patient is not nervous/anxious.   All other systems reviewed and are negative.    Objective: BP 112/76 (BP Location: Left Arm, Patient Position: Sitting, Cuff Size: Normal)   Pulse 68   Temp (!) 97.1 F (36.2 C) (Temporal)   Ht 5\' 6"  (1.676 m)   Wt 201 lb 4.8 oz (91.3 kg)   BMI 32.49 kg/m  Physical Exam Constitutional:      Appearance: Normal appearance.  HENT:     Head: Normocephalic and atraumatic.  Eyes:     Extraocular Movements: Extraocular movements intact.     Conjunctiva/sclera: Conjunctivae normal.  Cardiovascular:     Rate and Rhythm: Normal rate and regular rhythm.  Pulmonary:     Effort: Pulmonary effort is normal.     Breath sounds: Normal breath sounds.  Abdominal:     General:  Bowel sounds are normal. There is distension.     Palpations: Abdomen is soft.  Musculoskeletal:        General: Normal range of motion.     Cervical back: Normal range of motion and neck supple.     Right lower leg: Edema present.     Left lower leg: Edema present.  Skin:    General: Skin is warm.  Neurological:     General: No focal deficit present.     Mental Status: He is alert and oriented to person, place, and time.  Psychiatric:        Mood and Affect: Mood normal.        Behavior: Behavior normal.      Assessment/Plan:  1.  Decompensated alcoholic cirrhosis-check MELD labs today.  Absolute alcohol cessation.  Following with transplant hepatology in [place], not currently a candidate for transplant given ongoing alcohol use.  2.  Portal hypertension with ascites-will order paracentesis today.  Increase Lasix to 40 mg daily, spironolactone 100 mg daily.  Monitor blood pressure at home, call if drops below 90/60.  Recheck BMP in 1 week.    Nutrition recommendations:  High-protein diet from a primarily plant-based diet. Avoid red meat.  No raw or undercooked meat, seafood, or shellfish. Low-fat/cholesterol/carbohydrate diet. Limit sodium to no more than 2000 mg/day including everything that you eat and drink. Recommend at least 30 minutes of aerobic and resistance exercise 3 days/week.  3.  Esophageal varices with recurrent bleeding s/p EVL-will schedule for upper endoscopy today for reevaluation and further banding if needed.  Check CBC.  The risks including infection, bleed, or perforation as well as benefits, limitations, alternatives and imponderables have been reviewed with the patient. Potential for esophageal dilation, biopsy, etc. have  also been reviewed.  Questions have been answered. All parties agreeable.  4.  Hepatic encephalopathy-continue on Xifaxan, continue lactulose with goal of 2-3 loose bowel movements daily.  5.  HCC screening-CT abdomen pelvis  02/23/2023 cirrhotic findings, no hepatoma.  Repeat due 08/2023 with [facility]  Follow-up in 3 to 4 weeks.  03/23/2023 11:49 AM   Disclaimer: This note was dictated with voice recognition software. Similar sounding words can inadvertently be transcribed and may not be corrected upon review.

## 2023-03-23 NOTE — Procedures (Signed)
PROCEDURE SUMMARY:  Successful ultrasound guided paracentesis from the left lower quadrant.  Yielded 4.0 L of clear yellow fluid.  No immediate complications.  The patient tolerated the procedure well.   Specimen was sent for labs.  EBL < 5mL  The patient has previously been formally evaluated by the [facility] and is being actively followed for potential future intervention.  Lynnette Caffey, PA-C

## 2023-03-23 NOTE — Progress Notes (Signed)
Left sided paracentesis procedure and 25G of IV Albumin tolerated well today and 4L of clear yellow ascites removed with labs sent for processing. PT verbalized understanding of discharge instructions and ambulatory at departure with no acute distress noted.

## 2023-03-23 NOTE — Patient Instructions (Signed)
I will order paracentesis today to help with the fluid in your abdomen.  I will place standing orders to have this done every 3 to 4 weeks as needed.  I am going to increase your spironolactone to 100 mg daily and furosemide to 40 mg daily.  Check blood work in 1 week.  Recommend you monitor your blood pressure at home and let us know if drops below 90/60.  We will schedule you for upper endoscopy given recent variceal bleed.  Follow-up in 3 to 4 weeks.  It was very nice seeing both you today.  Dr. Marletta Lor

## 2023-03-23 NOTE — Telephone Encounter (Signed)
Called pt to schedule EGD. He states he needs to look at his calendar. He will call back.

## 2023-03-24 LAB — CBC
HCT: 31.7 % — ABNORMAL LOW (ref 38.5–50.0)
Hemoglobin: 11.5 g/dL — ABNORMAL LOW (ref 13.2–17.1)
MCH: 36.9 pg — ABNORMAL HIGH (ref 27.0–33.0)
MCHC: 36.3 g/dL — ABNORMAL HIGH (ref 32.0–36.0)
MCV: 101.6 fL — ABNORMAL HIGH (ref 80.0–100.0)
MPV: 11.4 fL (ref 7.5–12.5)
Platelets: 123 10*3/uL — ABNORMAL LOW (ref 140–400)
RBC: 3.12 10*6/uL — ABNORMAL LOW (ref 4.20–5.80)
RDW: 11.8 % (ref 11.0–15.0)
WBC: 8.5 10*3/uL (ref 3.8–10.8)

## 2023-03-24 LAB — COMPREHENSIVE METABOLIC PANEL
AG Ratio: 0.9 (calc) — ABNORMAL LOW (ref 1.0–2.5)
ALT: 29 U/L (ref 9–46)
AST: 62 U/L — ABNORMAL HIGH (ref 10–35)
Albumin: 2.9 g/dL — ABNORMAL LOW (ref 3.6–5.1)
Alkaline phosphatase (APISO): 174 U/L — ABNORMAL HIGH (ref 35–144)
BUN: 7 mg/dL (ref 7–25)
CO2: 29 mmol/L (ref 20–32)
Calcium: 8.7 mg/dL (ref 8.6–10.3)
Chloride: 97 mmol/L — ABNORMAL LOW (ref 98–110)
Creat: 0.76 mg/dL (ref 0.70–1.30)
Globulin: 3.2 g/dL (calc) (ref 1.9–3.7)
Glucose, Bld: 254 mg/dL — ABNORMAL HIGH (ref 65–139)
Potassium: 3.8 mmol/L (ref 3.5–5.3)
Sodium: 134 mmol/L — ABNORMAL LOW (ref 135–146)
Total Bilirubin: 5.6 mg/dL — ABNORMAL HIGH (ref 0.2–1.2)
Total Protein: 6.1 g/dL (ref 6.1–8.1)

## 2023-03-24 LAB — PROTIME-INR
INR: 1.5 — ABNORMAL HIGH
Prothrombin Time: 15.4 s — ABNORMAL HIGH (ref 9.0–11.5)

## 2023-03-25 ENCOUNTER — Telehealth: Payer: Self-pay | Admitting: *Deleted

## 2023-03-25 ENCOUNTER — Encounter: Payer: Self-pay | Admitting: *Deleted

## 2023-03-25 NOTE — Progress Notes (Signed)
Cardiology Office Note  Date: 03/25/2023   ID: Shawn Velazquez, DOB [birth year], MRN [id number]  PCP:  [name], MD  Cardiologist:  Marjo Bicker, MD Electrophysiologist:  None   History of Present Illness: Shawn Velazquez is a 57 y.o. male known to have alcoholic cirrhosis, hypertension was referred to cardiology clinic for evaluation of prolonged QTc interval.  I reviewed all the EKGs in the EMR and after correcting for QRS duration, QTc interval ranged between 450 and 490 ms.  No family history of sudden cardiac death in the 19s and 30s.  No known history of cardiac arrest.  No syncope, palpitations.  He does have dizziness and does not take p.o. fluids.  Barely drinks water.  No angina, orthopnea or PND.  He has baseline SOB.  Echocardiogram from 02/2023 showed normal LVEF, normal diastology, normal RV function, and no valvular heart disease.  Past Medical History:  Diagnosis Date   Anxiety    Arthritis    affects hands, shoulder, neck, knees, hips, ankles, toes   Chronic back pain    Diabetes mellitus    diet controlled   Eczema    Esophageal varices (HCC)    Fatty liver, alcoholic    GERD (gastroesophageal reflux disease)    Glaucoma    slight case   Hypertension    Iron deficiency anemia due to chronic blood loss 11/25/2022   Psoriasis    Sleep apnea    Substance abuse (HCC)    Vertigo 09/11/2017   Wears glasses     Past Surgical History:  Procedure Laterality Date   COLONOSCOPY WITH PROPOFOL N/A 04/05/2022   Procedure: COLONOSCOPY WITH PROPOFOL;  Surgeon: Lanelle Bal, DO;  Location: [facility];  Service: Endoscopy;  Laterality: N/A;  10:45 AM,unable to reach pt to move up   ESOPHAGEAL BANDING N/A 08/26/2022   Procedure: ESOPHAGEAL BANDING;  Surgeon: Lanelle Bal, DO;  Location: [facility];  Service: Endoscopy;  Laterality: N/A;   ESOPHAGEAL BANDING  09/24/2022   Procedure: ESOPHAGEAL BANDING;  Surgeon: Corbin Ade, MD;  Location:  [facility];  Service: Endoscopy;;   ESOPHAGEAL BANDING  02/22/2023   Procedure: ESOPHAGEAL BANDING;  Surgeon: Dolores Frame, MD;  Location: [facility];  Service: Gastroenterology;;   ESOPHAGOGASTRODUODENOSCOPY (EGD) WITH PROPOFOL N/A 08/26/2022   Procedure: ESOPHAGOGASTRODUODENOSCOPY (EGD) WITH PROPOFOL;  Surgeon: Lanelle Bal, DO;  [facility]: [facility];  Service: Endoscopy;  Laterality: N/A;   ESOPHAGOGASTRODUODENOSCOPY (EGD) WITH PROPOFOL N/A 09/24/2022   Procedure: ESOPHAGOGASTRODUODENOSCOPY (EGD) WITH PROPOFOL;  Surgeon: Corbin Ade, MD;  [facility]: [facility];  Service: Endoscopy;  Laterality: N/A;  hematemesis, esophageal varices, melena   ESOPHAGOGASTRODUODENOSCOPY (EGD) WITH PROPOFOL N/A 02/22/2023   Procedure: ESOPHAGOGASTRODUODENOSCOPY (EGD) WITH PROPOFOL;  Surgeon: Dolores Frame, MD;  Location: [facility];  Service: Gastroenterology;  Laterality: N/A;   MULTIPLE TOOTH EXTRACTIONS     POLYPECTOMY  04/05/2022   Procedure: POLYPECTOMY;  Surgeon: Lanelle Bal, DO;  [facility]: [facility];  Service: Endoscopy;;   POSTERIOR CERVICAL LAMINECTOMY  04/21/2012   Procedure: POSTERIOR CERVICAL LAMINECTOMY;  Surgeon: Mariam Dollar, MD;  Location: [facility];  Service: Neurosurgery;  Laterality: Left;  Posterior Cervical laminectomy/foraminotomy and diskectomy, left cervical seven-thoracic one   ROTATOR CUFF REPAIR     THORACIC DISCECTOMY Left 04/03/2014   Procedure: Left Thoracic Seven to Eight, Thoracic Eight to Nine Thoracic Discectomy ;  Surgeon: Mariam Dollar, MD;  Location:  [facility];  Service: Neurosurgery;  Laterality: Left;    Current Outpatient Medications  Medication Sig Dispense Refill   albuterol (VENTOLIN HFA) 108 (90 Base) MCG/ACT inhaler Inhale 2 puffs into the lungs every 6 (six) hours as needed for wheezing or shortness of breath. 8 g 2   busPIRone (BUSPAR) 5 MG tablet Take 1 tablet (5 mg total) by mouth daily as needed (anxiety).      carvedilol (COREG) 3.125 MG tablet Take 1 tablet (3.125 mg total) by mouth 2 (two) times daily with a meal. 60 tablet 2   Continuous Glucose Sensor (DEXCOM G7 SENSOR) MISC Inject 1 application  into the skin as directed. Change sensor every 10 days as directed. 9 each 3   feeding supplement (ENSURE ENLIVE / ENSURE PLUS) LIQD Take 237 mLs by mouth 2 (two) times daily between meals. 237 mL 12   ferrous sulfate 324 MG TBEC Take 1 tablet (324 mg total) by mouth daily with breakfast. 30 tablet 3   FLUoxetine (PROZAC) 10 MG capsule Take 1 capsule (10 mg total) by mouth 2 (two) times daily.     folic acid (FOLVITE) 1 MG tablet TAKE 1 TABLET BY MOUTH DAILY 30 tablet 5   gabapentin (NEURONTIN) 100 MG capsule Take 1 tablet by mouth daily.     glucose blood test strip Use as instructed to monitor glucose 4 times daily 100 each 12   insulin glargine (LANTUS SOLOSTAR) 100 UNIT/ML Solostar Pen Inject 30 Units into the skin at bedtime. 30 mL 3   insulin lispro (HUMALOG KWIKPEN) 100 UNIT/ML KwikPen Inject 5-11 Units into the skin 3 (three) times daily. 28 mL 3   Insulin Pen Needle (PEN NEEDLES) 32G X 4 MM MISC 1 each by Does not apply route in the morning, at noon, in the evening, and at bedtime. Use to inject insulin 4 times daily 200 each 3   lactulose (CHRONULAC) 10 GM/15ML solution Take 30 mLs (20 g total) by mouth 2 (two) times daily. 946 mL 2   levocetirizine (XYZAL) 5 MG tablet Take 1 tablet by mouth daily.     MAGNESIUM PO Take by mouth. daily     midodrine (PROAMATINE) 5 MG tablet Take 1 tablet (5 mg total) by mouth 3 (three) times daily with meals. To help Prevent Low Blood Pressure (Patient taking differently: Take 5 mg by mouth 2 (two) times daily with a meal. To help Prevent Low Blood Pressure) 90 tablet 3   Multiple Vitamin (MULTIVITAMIN) tablet Take 1 tablet by mouth daily.     ondansetron (ZOFRAN-ODT) 4 MG disintegrating tablet Take 1 tablet (4 mg total) by mouth every 8 (eight) hours as needed  for nausea or vomiting. 30 tablet 1   pantoprazole (PROTONIX) 40 MG tablet Take 1 tablet (40 mg total) by mouth daily. 30 tablet 1   thiamine (VITAMIN B-1) 100 MG tablet Take 1 tablet (100 mg total) by mouth daily. 30 tablet 3   XIFAXAN 550 MG TABS tablet TAKE 1 TABLET BY MOUTH TWICE DAILY 60 tablet 11   furosemide (LASIX) 40 MG tablet Take 1 tablet (40 mg total) by mouth daily. 30 tablet 11   spironolactone (ALDACTONE) 50 MG tablet Take 2 tablets (100 mg total) by mouth daily. 60 tablet 11   No current facility-administered medications for this visit.   Allergies:  Neosporin + pain relief max st [neomy-bacit-polymyx-pramoxine]   Social History: The patient  reports that he has been smoking cigarettes. He started smoking about 40  years ago. He has a 10 pack-year smoking history. He has never used smokeless tobacco. He reports that he does not currently use alcohol after a past usage of about 12.0 standard drinks of alcohol per week. He reports that he does not use drugs.   Family History: The patient's family history includes Cancer - Lung in his father and another family member; Diabetes in his father; Hypertension in an other family member.   ROS:  Please see the history of present illness. Otherwise, complete review of systems is positive for none.  All other systems are reviewed and negative.   Physical Exam: VS:  BP 108/62 (BP Location: Left Arm, Patient Position: Sitting, Cuff Size: Normal)   Pulse 65   Ht 5\' 6"  (1.676 m)   Wt 202 lb 6.4 oz (91.8 kg)   SpO2 95%   BMI 32.67 kg/m , BMI Body mass index is 32.67 kg/m.  Wt Readings from Last 3 Encounters:  03/23/23 201 lb 4.8 oz (91.3 kg)  03/21/23 202 lb 6.4 oz (91.8 kg)  02/22/23 172 lb 13.5 oz (78.4 kg)    General: Patient appears comfortable at rest. HEENT: Conjunctiva and lids normal, oropharynx clear with moist mucosa. Neck: Supple, no elevated JVP or carotid bruits, no thyromegaly. Lungs: Clear to auscultation, nonlabored  breathing at rest. Cardiac: Regular rate and rhythm, no S3 or significant systolic murmur, no pericardial rub. Abdomen: Soft, nontender, no hepatomegaly, bowel sounds present, no guarding or rebound. Extremities: No pitting edema, distal pulses 2+. Skin: Warm and dry. Musculoskeletal: No kyphosis. Neuropsychiatric: Alert and oriented x3, affect grossly appropriate.  Recent Labwork: 08/02/2022: B Natriuretic Peptide 96.0 08/03/2022: TSH 1.256 02/25/2023: Magnesium 1.7 03/23/2023: ALT 29; AST 62; BUN 7; Creat 0.76; Hemoglobin 11.5; Platelets 123; Potassium 3.8; Sodium 134     Component Value Date/Time   CHOL 109 11/24/2021 0817   TRIG 95 11/24/2021 0817   HDL 34 (L) 11/24/2021 0817   CHOLHDL 3.2 11/24/2021 0817   LDLCALC 57 11/24/2021 0817     Assessment and Plan:  Prolonged QTc: I reviewed all the EKGs in the EMR and after correcting for the QRS duration, QTc ranged between 450 ms and 490 ms. He also has decreased p.o. intake, underlying electrolyte abnormalities which could contribute to prolonged QTc. Strongly encouraged adequate nutrition and p.o. hydration. Avoid too many QTc prolonging medications. No family history of sudden cardiac death in 58s or 30s.  No prior history of cardiac arrest.  No syncope.  No indication for genetic testing for long QT syndrome at this moment.  Alcoholic cirrhosis: Continue p.o. Lasix 40 mg once daily, spironolactone 100 mg once daily and lactulose 20 g 2 times daily.  Follows up with GI.  Quit alcohol many months ago.  Symptomatic hypotension: Patient has decreased p.o. intake of fluids.  Strongly encouraged p.o. hydration.  Currently on midodrine 5 mg twice daily which will need to be weaned off eventually.    Medication Adjustments/Labs and Tests Ordered: Current medicines are reviewed at length with the patient today.  Concerns regarding medicines are outlined above.    Disposition:  Follow up 1 year  Signed, Toshia Larkin Verne Spurr,  MD, 03/25/2023 7:30 PM     [facility] [address] [place], [place] [address]

## 2023-03-25 NOTE — Telephone Encounter (Signed)
error 

## 2023-03-28 ENCOUNTER — Ambulatory Visit: Payer: BC Managed Care – PPO | Admitting: Gastroenterology

## 2023-03-28 LAB — CULTURE, BODY FLUID W GRAM STAIN -BOTTLE: Culture: NO GROWTH

## 2023-03-28 NOTE — Progress Notes (Unsigned)
[facility] [address], [place] [address]   CLINIC:  Medical Oncology/Hematology  PCP:  [name], MD [address] [place] [place] [address] [phone]   REASON FOR VISIT:  Follow-up for macrocytic anemia and thrombocytopenia  CURRENT THERAPY: Intermittent IV iron  INTERVAL HISTORY:   Shawn Velazquez 57 y.o. male returns for routine follow-up of anemia and thrombocytopenia.  He was last seen by Rojelio Brenner PA-C on 01/26/2023.  Since his last visit, he was hospitalized from 02/22/2023 through 02/28/2023 for upper GI (variceal) bleeding s/p EGD and esophageal variceal banding on 02/22/2023.  He received IV octreotide, but did not require blood transfusion.  At today's visit, he reports feeling fair.   He denies any recurrent hematemesis, hematochezia, or melena since hospital discharge on 02/28/2023.  He has ongoing fatigue.   He feels cold all the time.   No B symptoms, masses, or lymphadenopathy.   He denies ice pica.  He reports lightheadedness and dyspnea on exertion. No syncopal episodes.     He is taking ferrous sulfate 325 mg daily.  He continues to have low appetite that comes and goes, but tries to make himself eat 3 meals a day.  He has regained some of the weight he previously lost, but his weight does fluctuate due to fluid overload and paracentesis (last paracentesis on 03/23/2023 removed 4 L of fluid).   He has 30% energy and 30% appetite.   ASSESSMENT & PLAN:  1.  Thrombocytopenia & macrocytic anemia - Seen at the request of NP Lupita Raider for evaluation and treatment of anemia and thrombocytopenia. - Diagnosed with liver cirrhosis in January 2024 CT abdomen/pelvis (08/02/2022) showed nodular liver consistent with cirrhosis as well as stigmata of portal venous hypertension including moderate volume ascites, esophageal and gastric varices, and recanalization of the umbilical vein; there were some scattered liver lesions noted, but MRI liver  appeared benign. Splenomegaly on MRI with spleen measuring about 14.4 cm. Follows with [facility].  - Hematology workup (08/18/2022): SPEP negative for M spike.  Polyclonal increase in immunoglobulins on immunofixation.  Normal FLC ratio 1.31 (elevated kappa 38.9/elevated lambda 29.7) B12, MMA, folate, copper normal.  No evidence of renal dysfunction - Creatinine 0.82/GFR >60. Cystatin C mildly elevated 1.24, correlating to EGFR around 68 Ferritin 375, iron saturation 33% Elevated immature platelet fraction 8.7% LDH 210, reticulocytes 2.3%.  Bilirubin 2.3 (recently diagnosed liver cirrhosis and other elevations in LFTs are noted).  Haptoglobin normal at 49. HIV, hepatitis panel, ANA, and RF negative. - Multiple hospitalizations for upper GI bleeds/esophageal variceal bleeds.  Most recently hospitalized 02/22/2023 through 02/28/2023. - Most recent EGD (02/22/2023):  Three columns of grade 2 varices with stigmata of recent bleeding, s/p banding and complete eradication of varices.  Diffuse portal hypertensive gastropathy in the entire examined stomach.  No gastric varices. - Colonoscopy on 04/05/2022 with nonbleeding internal hemorrhoids, polyps x 6, otherwise normal.   - Reports easy bruising.  No B symptoms. - He has had some intermittent melanotic stool, none since last hospital discharge on 02/28/2023 - Received Venofer 300 mg x 4 from 12/03/2022 through 12/24/2022 - Most recent labs (03/29/2023):  Hgb 11.4/MCV 105.4, platelets 114, normal WBC Ferritin 394, iron saturation 93% - DIFFERENTIAL DIAGNOSIS favors thrombocytopenia and macrocytic anemia secondary to liver cirrhosis/splenomegaly.  He has acute worsening of his anemia secondary to recent upper GI bleed. - PLAN: Anemia improved.  Thrombocytopenia secondary to splenomegaly is stable. - No indication for  IV iron at this time - We will decrease frequency of ferrous sulfate 325 mg to every other day. - Monthly CBC/D+  BB sample - Labs in 3 months (CBC/D, ferritin, iron/TIBC) with OFFICE visit    2.  Weight loss - Reports very poor appetite and sometimes has nausea and vomiting when he tries to eat, which has resulted in approximately 20 pound weight loss in the past 2 months. - Reviewed results from CT abdomen/pelvis and MR liver from January 2024 - no clear evidence of malignancy - PLAN: Suspect unintentional weight loss due to malnutrition and sarcopenia in the setting of liver cirrhosis, but we will continue to watch closely and will check additional imaging if he has progressive decline.  3.  Other history - PMH: Liver cirrhosis, vertigo, sleep apnea, psoriasis, hypertension, GERD, diabetes, chronic back pain, anxiety/depression - TOBACCO: Currently smokes 0.5 PPD cigarettes.  Has been smoking 0.25-0.5 PPD since age 68, but had quit for about 10 years.  Total pack-year history approximately 14. - ALCOHOL: Heavy alcohol consumption for over 30 years.  Has cut back to 1 to 2 cans of beer each week following diagnosis of liver cirrhosis in February 2024. - DRUG/SUBSTANCE ABUSE: Reports previous addiction to hydrocodone pills, which he self reported to his PCP; hydrocodone was discontinued by PCP at the request of the patient. - SOCIAL he works for [employer], but has not been back to work following hospitalization for cirrhosis in January/February 2024.  He is accompanied in clinic by his girlfriend, Shawn Velazquez. - FAMILY: No family history of blood problems.  Maternal grandfather had lung cancer.  Father deceased secondary to mesothelioma.  PLAN SUMMARY:  >> Monthly CBC/D+ BB sample >> Same-day labs (CBC/D, BB sample, ferritin, iron/TIBC) + OFFICE visit in 3 months     REVIEW OF SYSTEMS:   Review of Systems  Constitutional:  Positive for appetite change and fatigue. Negative for chills, diaphoresis, fever and unexpected weight change.  HENT:   Positive for trouble swallowing. Negative for lump/mass and  nosebleeds.   Eyes:  Negative for eye problems.  Respiratory:  Positive for cough and shortness of breath. Negative for hemoptysis.   Cardiovascular:  Negative for chest pain, leg swelling and palpitations.  Gastrointestinal:  Positive for constipation, diarrhea, nausea and vomiting. Negative for abdominal pain and blood in stool.  Genitourinary:  Negative for hematuria.   Skin: Negative.   Neurological:  Positive for dizziness and numbness. Negative for headaches and light-headedness.  Hematological:  Bruises/bleeds easily (bruising).  Psychiatric/Behavioral:  Positive for depression and sleep disturbance. The patient is not nervous/anxious.      PHYSICAL EXAM:  ECOG PERFORMANCE STATUS: 1 - Symptomatic but completely ambulatory  Vitals:   03/29/23 0946  BP: 118/80  Pulse: 68  Resp: 16  Temp: 98.1 F (36.7 C)  SpO2: 100%    Filed Weights   03/29/23 0946  Weight: 195 lb 8.8 oz (88.7 kg)    Physical Exam Constitutional:      Appearance: Normal appearance. He is obese.  Cardiovascular:     Heart sounds: Normal heart sounds.  Pulmonary:     Effort: Pulmonary effort is normal.     Breath sounds: Normal breath sounds.  Neurological:     General: No focal deficit present.     Mental Status: Mental status is at baseline.  Psychiatric:        Behavior: Behavior normal. Behavior is cooperative.     PAST MEDICAL/SURGICAL HISTORY:  Past Medical History:  Diagnosis  Date   Anxiety    Arthritis    affects hands, shoulder, neck, knees, hips, ankles, toes   Chronic back pain    Diabetes mellitus    diet controlled   Eczema    Esophageal varices (HCC)    Fatty liver, alcoholic    GERD (gastroesophageal reflux disease)    Glaucoma    slight case   Hypertension    Iron deficiency anemia due to chronic blood loss 11/25/2022   Psoriasis    Sleep apnea    Substance abuse (HCC)    Vertigo 09/11/2017   Wears glasses    Past Surgical History:  Procedure Laterality Date    COLONOSCOPY WITH PROPOFOL N/A 04/05/2022   Procedure: COLONOSCOPY WITH PROPOFOL;  Surgeon: Lanelle Bal, DO;  [facility];  Service: Endoscopy;  Laterality: N/A;  10:45 AM,unable to reach pt to move up   ESOPHAGEAL BANDING N/A 08/26/2022   Procedure: ESOPHAGEAL BANDING;  Surgeon: Lanelle Bal, DO;  [facility]: [facility];  Service: Endoscopy;  Laterality: N/A;   ESOPHAGEAL BANDING  09/24/2022   Procedure: ESOPHAGEAL BANDING;  Surgeon: Corbin Ade, MD;  [facility]: [facility];  Service: Endoscopy;;   ESOPHAGEAL BANDING  02/22/2023   Procedure: ESOPHAGEAL BANDING;  Surgeon: Dolores Frame, MD;  [facility];  Service: Gastroenterology;;   ESOPHAGOGASTRODUODENOSCOPY (EGD) WITH PROPOFOL N/A 08/26/2022   Procedure: ESOPHAGOGASTRODUODENOSCOPY (EGD) WITH PROPOFOL;  Surgeon: Lanelle Bal, DO;  [facility];  Service: Endoscopy;  Laterality: N/A;   ESOPHAGOGASTRODUODENOSCOPY (EGD) WITH PROPOFOL N/A 09/24/2022   Procedure: ESOPHAGOGASTRODUODENOSCOPY (EGD) WITH PROPOFOL;  Surgeon: Corbin Ade, MD;  [facility];  Service: Endoscopy;  Laterality: N/A;  hematemesis, esophageal varices, melena   ESOPHAGOGASTRODUODENOSCOPY (EGD) WITH PROPOFOL N/A 02/22/2023   Procedure: ESOPHAGOGASTRODUODENOSCOPY (EGD) WITH PROPOFOL;  Surgeon: Dolores Frame, MD;  [facility];  Service: Gastroenterology;  Laterality: N/A;   MULTIPLE TOOTH EXTRACTIONS     POLYPECTOMY  04/05/2022   Procedure: POLYPECTOMY;  Surgeon: Lanelle Bal, DO;  [facility];  Service: Endoscopy;;   POSTERIOR CERVICAL LAMINECTOMY  04/21/2012   Procedure: POSTERIOR CERVICAL LAMINECTOMY;  Surgeon: Mariam Dollar, MD;  [facility]: [facility];  Service: Neurosurgery;  Laterality: Left;  Posterior Cervical laminectomy/foraminotomy and diskectomy, left cervical seven-thoracic one   ROTATOR CUFF REPAIR     THORACIC DISCECTOMY Left 04/03/2014   Procedure:  Left Thoracic Seven to Eight, Thoracic Eight to Nine Thoracic Discectomy ;  Surgeon: Mariam Dollar, MD;  Location: [facility];  Service: Neurosurgery;  Laterality: Left;    SOCIAL HISTORY:  Social History   Socioeconomic History   Marital status: Divorced    Spouse name: Not on file   Number of children: Not on file   Years of education: Not on file   Highest education level: Not on file  Occupational History    Comment: Truck to storage  Tobacco Use   Smoking status: Every Day    Current packs/day: 0.00    Average packs/day: 0.3 packs/day for 40.0 years (10.0 ttl pk-yrs)    Types: Cigarettes    Start date: 05/09/1982    Last attempt to quit: 05/09/2022    Years since quitting: 0.8   Smokeless tobacco: Never   Tobacco comments:    pt trying to quit on his own  Vaping Use   Vaping status: Former  Substance and Sexual Activity   Alcohol use: Not Currently  Alcohol/week: 12.0 standard drinks of alcohol    Types: 12 Cans of beer per week    Comment: last drink 2 beers on Sunday 02/20/2023   Drug use: No   Sexual activity: Yes    Birth control/protection: None  Other Topics Concern   Not on file  Social History Narrative   Not on file   Social Determinants of Health   Financial Resource Strain: Low Risk  (03/02/2023)   Overall Financial Resource Strain (CARDIA)    Difficulty of Paying Living Expenses: Not very hard  Food Insecurity: No Food Insecurity (03/02/2023)   Hunger Vital Sign    Worried About Running Out of Food in the Last Year: Never true    Ran Out of Food in the Last Year: Never true  Transportation Needs: No Transportation Needs (03/02/2023)   PRAPARE - Administrator, Civil Service (Medical): No    Lack of Transportation (Non-Medical): No  Physical Activity: Not on file  Stress: Stress Concern Present (10/07/2022)   [employer] of Occupational Health - Occupational Stress Questionnaire    Feeling of Stress : To some extent  Social  Connections: Not on file  Intimate Partner Violence: Not At Risk (12/08/2022)   Humiliation, Afraid, Rape, and Kick questionnaire    Fear of Current or Ex-Partner: No    Emotionally Abused: No    Physically Abused: No    Sexually Abused: No    FAMILY HISTORY:  Family History  Problem Relation Age of Onset   Diabetes Father    Cancer - Lung Father    Hypertension Other    Cancer - Lung Other     CURRENT MEDICATIONS:  Outpatient Encounter Medications as of 03/29/2023  Medication Sig Note   albuterol (VENTOLIN HFA) 108 (90 Base) MCG/ACT inhaler Inhale 2 puffs into the lungs every 6 (six) hours as needed for wheezing or shortness of breath.    busPIRone (BUSPAR) 5 MG tablet Take 1 tablet (5 mg total) by mouth daily as needed (anxiety).    Continuous Glucose Sensor (DEXCOM G7 SENSOR) MISC Inject 1 application  into the skin as directed. Change sensor every 10 days as directed.    feeding supplement (ENSURE ENLIVE / ENSURE PLUS) LIQD Take 237 mLs by mouth 2 (two) times daily between meals.    ferrous sulfate 324 MG TBEC Take 1 tablet (324 mg total) by mouth daily with breakfast.    FLUoxetine (PROZAC) 10 MG capsule Take 1 capsule (10 mg total) by mouth 2 (two) times daily.    folic acid (FOLVITE) 1 MG tablet TAKE 1 TABLET BY MOUTH DAILY    furosemide (LASIX) 40 MG tablet Take 1 tablet (40 mg total) by mouth daily.    gabapentin (NEURONTIN) 100 MG capsule Take 1 tablet by mouth daily.    glucose blood test strip Use as instructed to monitor glucose 4 times daily    insulin glargine (LANTUS SOLOSTAR) 100 UNIT/ML Solostar Pen Inject 30 Units into the skin at bedtime.    insulin lispro (HUMALOG KWIKPEN) 100 UNIT/ML KwikPen Inject 5-11 Units into the skin 3 (three) times daily.    Insulin Pen Needle (PEN NEEDLES) 32G X 4 MM MISC 1 each by Does not apply route in the morning, at noon, in the evening, and at bedtime. Use to inject insulin 4 times daily    lactulose (CHRONULAC) 10 GM/15ML solution  Take 30 mLs (20 g total) by mouth 2 (two) times daily. 03/23/2023: Patient is taking 15 ml  bid.   levocetirizine (XYZAL) 5 MG tablet Take 1 tablet by mouth daily.    MAGNESIUM PO Take by mouth. daily    midodrine (PROAMATINE) 5 MG tablet Take 1 tablet (5 mg total) by mouth 3 (three) times daily with meals. To help Prevent Low Blood Pressure (Patient taking differently: Take 5 mg by mouth 2 (two) times daily with a meal. To help Prevent Low Blood Pressure)    Multiple Vitamin (MULTIVITAMIN) tablet Take 1 tablet by mouth daily.    ondansetron (ZOFRAN-ODT) 4 MG disintegrating tablet Take 1 tablet (4 mg total) by mouth every 8 (eight) hours as needed for nausea or vomiting.    pantoprazole (PROTONIX) 40 MG tablet Take 1 tablet (40 mg total) by mouth daily.    spironolactone (ALDACTONE) 50 MG tablet Take 2 tablets (100 mg total) by mouth daily.    thiamine (VITAMIN B-1) 100 MG tablet Take 1 tablet (100 mg total) by mouth daily.    XIFAXAN 550 MG TABS tablet TAKE 1 TABLET BY MOUTH TWICE DAILY    carvedilol (COREG) 3.125 MG tablet Take 1 tablet (3.125 mg total) by mouth 2 (two) times daily with a meal.    No facility-administered encounter medications on file as of 03/29/2023.    ALLERGIES:  Allergies  Allergen Reactions   Neosporin + Pain Relief Max St [Neomy-Bacit-Polymyx-Pramoxine] Rash    LABORATORY DATA:  I have reviewed the labs as listed.  CBC    Component Value Date/Time   WBC 8.5 03/29/2023 0910   RBC 2.95 (L) 03/29/2023 0910   HGB 11.4 (L) 03/29/2023 0910   HGB 14.4 07/31/2021 1204   HCT 31.1 (L) 03/29/2023 0910   HCT 42.4 07/31/2021 1204   PLT 114 (L) 03/29/2023 0910   PLT 146 (L) 07/31/2021 1204   MCV 105.4 (H) 03/29/2023 0910   MCV 97 07/31/2021 1204   MCH 38.6 (H) 03/29/2023 0910   MCHC 36.7 (H) 03/29/2023 0910   RDW 12.4 03/29/2023 0910   RDW 11.8 07/31/2021 1204   LYMPHSABS 2.0 02/22/2023 0905   LYMPHSABS 1.6 07/31/2021 1204   MONOABS 1.2 (H) 02/22/2023 0905   EOSABS  0.3 02/22/2023 0905   EOSABS 0.2 07/31/2021 1204   BASOSABS 0.1 02/22/2023 0905   BASOSABS 0.1 07/31/2021 1204      Latest Ref Rng & Units 03/23/2023    9:44 AM 02/28/2023    5:13 AM 02/27/2023    4:18 AM  CMP  Glucose 65 - 139 mg/dL 657  846  962   BUN 7 - 25 mg/dL 7  10  8    Creatinine 0.70 - 1.30 mg/dL 9.52  8.41  3.24   Sodium 135 - 146 mmol/L 134  127  127   Potassium 3.5 - 5.3 mmol/L 3.8  4.9  5.0   Chloride 98 - 110 mmol/L 97  98  96   CO2 20 - 32 mmol/L 29  21  23    Calcium 8.6 - 10.3 mg/dL 8.7  8.3  8.2   Total Protein 6.1 - 8.1 g/dL 6.1  5.4  5.5   Total Bilirubin 0.2 - 1.2 mg/dL 5.6  5.9  6.4   Alkaline Phos 38 - 126 U/L  140  144   AST 10 - 35 U/L 62  95  110   ALT 9 - 46 U/L 29  42  41     DIAGNOSTIC IMAGING:  I have independently reviewed the relevant imaging and discussed with the patient.   WRAP UP:  All questions were answered. The patient knows to call the clinic with any problems, questions or concerns.  Medical decision making: Moderate  Time spent on visit: I spent 20 minutes counseling the patient face to face. The total time spent in the appointment was 30 minutes and more than 50% was on counseling.  Carnella Guadalajara, PA-C  03/29/2023 10:40 AM

## 2023-03-29 ENCOUNTER — Inpatient Hospital Stay: Payer: BC Managed Care – PPO | Attending: Hematology

## 2023-03-29 ENCOUNTER — Inpatient Hospital Stay: Payer: BC Managed Care – PPO | Admitting: Physician Assistant

## 2023-03-29 VITALS — BP 118/80 | HR 68 | Temp 98.1°F | Resp 16 | Wt 195.5 lb

## 2023-03-29 DIAGNOSIS — D6959 Other secondary thrombocytopenia: Secondary | ICD-10-CM | POA: Diagnosis not present

## 2023-03-29 DIAGNOSIS — D539 Nutritional anemia, unspecified: Secondary | ICD-10-CM

## 2023-03-29 DIAGNOSIS — D649 Anemia, unspecified: Secondary | ICD-10-CM | POA: Diagnosis present

## 2023-03-29 DIAGNOSIS — D696 Thrombocytopenia, unspecified: Secondary | ICD-10-CM | POA: Diagnosis not present

## 2023-03-29 DIAGNOSIS — R634 Abnormal weight loss: Secondary | ICD-10-CM | POA: Insufficient documentation

## 2023-03-29 DIAGNOSIS — I959 Hypotension, unspecified: Secondary | ICD-10-CM

## 2023-03-29 DIAGNOSIS — D500 Iron deficiency anemia secondary to blood loss (chronic): Secondary | ICD-10-CM | POA: Diagnosis not present

## 2023-03-29 DIAGNOSIS — R161 Splenomegaly, not elsewhere classified: Secondary | ICD-10-CM | POA: Diagnosis not present

## 2023-03-29 LAB — CBC
HCT: 31.1 % — ABNORMAL LOW (ref 39.0–52.0)
Hemoglobin: 11.4 g/dL — ABNORMAL LOW (ref 13.0–17.0)
MCH: 38.6 pg — ABNORMAL HIGH (ref 26.0–34.0)
MCHC: 36.7 g/dL — ABNORMAL HIGH (ref 30.0–36.0)
MCV: 105.4 fL — ABNORMAL HIGH (ref 80.0–100.0)
Platelets: 114 10*3/uL — ABNORMAL LOW (ref 150–400)
RBC: 2.95 MIL/uL — ABNORMAL LOW (ref 4.22–5.81)
RDW: 12.4 % (ref 11.5–15.5)
WBC: 8.5 10*3/uL (ref 4.0–10.5)
nRBC: 0 % (ref 0.0–0.2)

## 2023-03-29 LAB — SAMPLE TO BLOOD BANK

## 2023-03-29 LAB — FERRITIN: Ferritin: 394 ng/mL — ABNORMAL HIGH (ref 24–336)

## 2023-03-29 LAB — IRON AND TIBC
Iron: 184 ug/dL — ABNORMAL HIGH (ref 45–182)
Saturation Ratios: 93 % — ABNORMAL HIGH (ref 17.9–39.5)
TIBC: 198 ug/dL — ABNORMAL LOW (ref 250–450)
UIBC: 14 ug/dL

## 2023-03-29 NOTE — Patient Instructions (Signed)
[facility] **VISIT SUMMARY & IMPORTANT INSTRUCTIONS **   You were seen today by Rojelio Brenner PA-C for your anemia (low red blood cells) and thrombocytopenia (low platelets).    IRON DEFICIENCY ANEMIA Your blood and iron levels have improved. You continue to be at risk for recurrent GI blood loss (bleeding from your esophagus and stomach related to your liver cirrhosis) You do not need any IV iron at this time. You should continue taking iron supplement (ferrous sulfate 325 mg), but can change this to EVERY OTHER DAY. Seek immediate medical attention if you notice any recurrent bleeding such as bright red blood in the toilet, vomiting blood, or black tarry bowel movements.   LOW PLATELETS ("THROMBOCYTOPENIA") Your low platelets are related to your liver cirrhosis and enlarged spleen. You do not need any treatment of your low platelets, but we will continue to monitor this at follow-up visits.  We will check your blood count in 1 month and will check full lab panel lab panel and see you for office visit in 3 months.   ** Thank you for trusting me with your healthcare!  I strive to provide all of my patients with quality care at each visit.  If you receive a survey for this visit, I would be so grateful to you for taking the time to provide feedback.  Thank you in advance!  ~ Hever Castilleja                   Dr. Doreatha Massed   &   Rojelio Brenner, PA-C   - - - - - - - - - - - - - - - - - -    Thank you for choosing [facility] at [facility] to provide your oncology and hematology care.  To afford each patient quality time with our provider, please arrive at least 15 minutes before your scheduled appointment time.   If you have a lab appointment with the Cancer Center please come in thru the Main Entrance and check in at the main information desk.  You need to re-schedule your appointment should you arrive 10 or more  minutes late.  We strive to give you quality time with our providers, and arriving late affects you and other patients whose appointments are after yours.  Also, if you no show three or more times for appointments you may be dismissed from the clinic at the providers discretion.     Again, thank you for choosing [facility]  Our hope is that these requests will decrease the amount of time that you wait before being seen by our physicians.       _____________________________________________________________  Should you have questions after your visit to [facility], please contact our office at [phone] and follow the prompts.  Our office hours are 8:00 a.m. and 4:30 p.m. Monday - Friday.  Please note that voicemails left after 4:00 p.m. may not be returned until the following business day.  We are closed weekends and major holidays.  You do have access to a nurse 24-7, just call the main number to the clinic [phone] and do not press any options, hold on the line and a nurse will answer the phone.    For prescription refill requests, have your pharmacy contact our office and allow 72 hours.

## 2023-03-30 ENCOUNTER — Other Ambulatory Visit: Payer: BC Managed Care – PPO

## 2023-03-31 ENCOUNTER — Encounter: Payer: Self-pay | Admitting: Nurse Practitioner

## 2023-03-31 ENCOUNTER — Inpatient Hospital Stay: Payer: BC Managed Care – PPO

## 2023-03-31 ENCOUNTER — Ambulatory Visit (INDEPENDENT_AMBULATORY_CARE_PROVIDER_SITE_OTHER): Payer: BC Managed Care – PPO | Admitting: Nurse Practitioner

## 2023-03-31 VITALS — BP 94/58 | HR 64 | Ht 66.0 in | Wt 192.4 lb

## 2023-03-31 DIAGNOSIS — E782 Mixed hyperlipidemia: Secondary | ICD-10-CM

## 2023-03-31 DIAGNOSIS — E1165 Type 2 diabetes mellitus with hyperglycemia: Secondary | ICD-10-CM

## 2023-03-31 DIAGNOSIS — I10 Essential (primary) hypertension: Secondary | ICD-10-CM

## 2023-03-31 DIAGNOSIS — Z7984 Long term (current) use of oral hypoglycemic drugs: Secondary | ICD-10-CM

## 2023-03-31 DIAGNOSIS — Z794 Long term (current) use of insulin: Secondary | ICD-10-CM

## 2023-03-31 MED ORDER — INSULIN LISPRO (1 UNIT DIAL) 100 UNIT/ML (KWIKPEN): 8.0000 [IU] | PEN_INJECTOR | Freq: Three times a day (TID) | SUBCUTANEOUS | 3 refills | Status: DC

## 2023-03-31 NOTE — Progress Notes (Signed)
Endocrinology Follow Up Note       03/31/2023, 11:20 AM   Subjective:    Patient ID: Shawn Velazquez, male    DOB: 12/19/1965.  Shawn Velazquez is being seen in follow up after being seen in consultation for management of currently uncontrolled symptomatic diabetes requested by  [name], MD.   Past Medical History:  Diagnosis Date   Anxiety    Arthritis    affects hands, shoulder, neck, knees, hips, ankles, toes   Chronic back pain    Diabetes mellitus    diet controlled   Eczema    Esophageal varices (HCC)    Fatty liver, alcoholic    GERD (gastroesophageal reflux disease)    Glaucoma    slight case   Hypertension    Iron deficiency anemia due to chronic blood loss 11/25/2022   Psoriasis    Sleep apnea    Substance abuse (HCC)    Vertigo 09/11/2017   Wears glasses     Past Surgical History:  Procedure Laterality Date   COLONOSCOPY WITH PROPOFOL N/A 04/05/2022   Procedure: COLONOSCOPY WITH PROPOFOL;  Surgeon: Lanelle Bal, DO;  Location: [facility];  Service: Endoscopy;  Laterality: N/A;  10:45 AM,unable to reach pt to move up   ESOPHAGEAL BANDING N/A 08/26/2022   Procedure: ESOPHAGEAL BANDING;  Surgeon: Lanelle Bal, DO;  [facility]: [facility];  Service: Endoscopy;  Laterality: N/A;   ESOPHAGEAL BANDING  09/24/2022   Procedure: ESOPHAGEAL BANDING;  Surgeon: Corbin Ade, MD;  [facility]: [facility];  Service: Endoscopy;;   ESOPHAGEAL BANDING  02/22/2023   Procedure: ESOPHAGEAL BANDING;  Surgeon: Dolores Frame, MD;  [facility]: [facility];  Service: Gastroenterology;;   ESOPHAGOGASTRODUODENOSCOPY (EGD) WITH PROPOFOL N/A 08/26/2022   Procedure: ESOPHAGOGASTRODUODENOSCOPY (EGD) WITH PROPOFOL;  Surgeon: Lanelle Bal, DO;  [facility];  Service: Endoscopy;  Laterality: N/A;   ESOPHAGOGASTRODUODENOSCOPY (EGD) WITH PROPOFOL N/A 09/24/2022   Procedure:  ESOPHAGOGASTRODUODENOSCOPY (EGD) WITH PROPOFOL;  Surgeon: Corbin Ade, MD;  [facility];  Service: Endoscopy;  Laterality: N/A;  hematemesis, esophageal varices, melena   ESOPHAGOGASTRODUODENOSCOPY (EGD) WITH PROPOFOL N/A 02/22/2023   Procedure: ESOPHAGOGASTRODUODENOSCOPY (EGD) WITH PROPOFOL;  Surgeon: Dolores Frame, MD;  [facility];  Service: Gastroenterology;  Laterality: N/A;   MULTIPLE TOOTH EXTRACTIONS     POLYPECTOMY  04/05/2022   Procedure: POLYPECTOMY;  Surgeon: Lanelle Bal, DO;  [facility]: [facility];  Service: Endoscopy;;   POSTERIOR CERVICAL LAMINECTOMY  04/21/2012   Procedure: POSTERIOR CERVICAL LAMINECTOMY;  Surgeon: Mariam Dollar, MD;  Location: [facility];  Service: Neurosurgery;  Laterality: Left;  Posterior Cervical laminectomy/foraminotomy and diskectomy, left cervical seven-thoracic one   ROTATOR CUFF REPAIR     THORACIC DISCECTOMY Left 04/03/2014   Procedure: Left Thoracic Seven to Eight, Thoracic Eight to Nine Thoracic Discectomy ;  Surgeon: Mariam Dollar, MD;  Location: [facility];  Service: Neurosurgery;  Laterality: Left;    Social History   Socioeconomic History   Marital status: Divorced    Spouse name: Not on file   Number of children: Not on file   Years  of education: Not on file   Highest education level: Not on file  Occupational History    Comment: Truck to storage  Tobacco Use   Smoking status: Every Day    Current packs/day: 0.00    Average packs/day: 0.3 packs/day for 40.0 years (10.0 ttl pk-yrs)    Types: Cigarettes    Start date: 05/09/1982    Last attempt to quit: 05/09/2022    Years since quitting: 0.8   Smokeless tobacco: Never   Tobacco comments:    pt trying to quit on his own  Vaping Use   Vaping status: Former  Substance and Sexual Activity   Alcohol use: Not Currently    Alcohol/week: 12.0 standard drinks of alcohol    Types: 12 Cans of beer per week    Comment: last drink 2 beers on Sunday  02/20/2023   Drug use: No   Sexual activity: Yes    Birth control/protection: None  Other Topics Concern   Not on file  Social History Narrative   Not on file   Social Determinants of Health   Financial Resource Strain: Low Risk  (03/02/2023)   Overall Financial Resource Strain (CARDIA)    Difficulty of Paying Living Expenses: Not very hard  Food Insecurity: No Food Insecurity (03/02/2023)   Hunger Vital Sign    Worried About Running Out of Food in the Last Year: Never true    Ran Out of Food in the Last Year: Never true  Transportation Needs: No Transportation Needs (03/02/2023)   PRAPARE - Administrator, Civil Service (Medical): No    Lack of Transportation (Non-Medical): No  Physical Activity: Not on file  Stress: Stress Concern Present (10/07/2022)   [employer] of Occupational Health - Occupational Stress Questionnaire    Feeling of Stress : To some extent  Social Connections: Not on file    Family History  Problem Relation Age of Onset   Diabetes Father    Cancer - Lung Father    Hypertension Other    Cancer - Lung Other     Outpatient Encounter Medications as of 03/31/2023  Medication Sig   albuterol (VENTOLIN HFA) 108 (90 Base) MCG/ACT inhaler Inhale 2 puffs into the lungs every 6 (six) hours as needed for wheezing or shortness of breath.   busPIRone (BUSPAR) 5 MG tablet Take 1 tablet (5 mg total) by mouth daily as needed (anxiety).   Continuous Glucose Sensor (DEXCOM G7 SENSOR) MISC Inject 1 application  into the skin as directed. Change sensor every 10 days as directed.   feeding supplement (ENSURE ENLIVE / ENSURE PLUS) LIQD Take 237 mLs by mouth 2 (two) times daily between meals.   ferrous sulfate 324 MG TBEC Take 1 tablet (324 mg total) by mouth daily with breakfast.   FLUoxetine (PROZAC) 10 MG capsule Take 1 capsule (10 mg total) by mouth 2 (two) times daily.   folic acid (FOLVITE) 1 MG tablet TAKE 1 TABLET BY MOUTH DAILY   furosemide (LASIX) 40  MG tablet Take 1 tablet (40 mg total) by mouth daily.   gabapentin (NEURONTIN) 100 MG capsule Take 1 tablet by mouth daily.   glucose blood test strip Use as instructed to monitor glucose 4 times daily   insulin glargine (LANTUS SOLOSTAR) 100 UNIT/ML Solostar Pen Inject 30 Units into the skin at bedtime.   Insulin Pen Needle (PEN NEEDLES) 32G X 4 MM MISC 1 each by Does not apply route in the morning, at noon, in the  evening, and at bedtime. Use to inject insulin 4 times daily   lactulose (CHRONULAC) 10 GM/15ML solution Take 30 mLs (20 g total) by mouth 2 (two) times daily.   levocetirizine (XYZAL) 5 MG tablet Take 1 tablet by mouth daily.   MAGNESIUM PO Take by mouth. daily   midodrine (PROAMATINE) 5 MG tablet Take 1 tablet (5 mg total) by mouth 3 (three) times daily with meals. To help Prevent Low Blood Pressure (Patient taking differently: Take 5 mg by mouth 2 (two) times daily with a meal. To help Prevent Low Blood Pressure)   Multiple Vitamin (MULTIVITAMIN) tablet Take 1 tablet by mouth daily.   ondansetron (ZOFRAN-ODT) 4 MG disintegrating tablet Take 1 tablet (4 mg total) by mouth every 8 (eight) hours as needed for nausea or vomiting.   pantoprazole (PROTONIX) 40 MG tablet Take 1 tablet (40 mg total) by mouth daily.   spironolactone (ALDACTONE) 50 MG tablet Take 2 tablets (100 mg total) by mouth daily.   thiamine (VITAMIN B-1) 100 MG tablet Take 1 tablet (100 mg total) by mouth daily.   XIFAXAN 550 MG TABS tablet TAKE 1 TABLET BY MOUTH TWICE DAILY   [DISCONTINUED] insulin lispro (HUMALOG KWIKPEN) 100 UNIT/ML KwikPen Inject 5-11 Units into the skin 3 (three) times daily.   carvedilol (COREG) 3.125 MG tablet Take 1 tablet (3.125 mg total) by mouth 2 (two) times daily with a meal.   insulin lispro (HUMALOG KWIKPEN) 100 UNIT/ML KwikPen Inject 8-14 Units into the skin 3 (three) times daily.   [DISCONTINUED] albuterol (VENTOLIN HFA) 108 (90 Base) MCG/ACT inhaler Inhale 2 puffs into the lungs every  6 (six) hours as needed for wheezing or shortness of breath. (Patient not taking: Reported on 02/22/2023)   [DISCONTINUED] busPIRone (BUSPAR) 5 MG tablet Take 1 tablet (5 mg total) by mouth 2 (two) times daily. (Patient taking differently: Take 5 mg by mouth daily as needed (anxiety).)   [DISCONTINUED] FLUoxetine (PROZAC) 10 MG capsule TAKE 4 CAPSULES BY MOUTH EVERY DAY (Patient taking differently: Take 10 mg by mouth 2 (two) times daily.)   [DISCONTINUED] furosemide (LASIX) 40 MG tablet Take 1 tablet (40 mg total) by mouth daily.   [DISCONTINUED] lactulose (CHRONULAC) 10 GM/15ML solution Take 15 mLs (10 g total) by mouth daily.   [DISCONTINUED] pantoprazole (PROTONIX) 40 MG tablet Take 1 tablet (40 mg total) by mouth 2 (two) times daily. (Patient not taking: Reported on 02/22/2023)   [DISCONTINUED] rifaximin (XIFAXAN) 550 MG TABS tablet Take 1 tablet (550 mg total) by mouth 2 (two) times daily.   [DISCONTINUED] spironolactone (ALDACTONE) 50 MG tablet Take 1 tablet (50 mg total) by mouth daily.   No facility-administered encounter medications on file as of 03/31/2023.    ALLERGIES: Allergies  Allergen Reactions   Neosporin + Pain Relief Max St [Neomy-Bacit-Polymyx-Pramoxine] Rash    VACCINATION STATUS: Immunization History  Administered Date(s) Administered   Influenza,inj,Quad PF,6+ Mos 07/31/2021   Moderna Sars-Covid-2 Vaccination 01/09/2020, 02/06/2020   Tdap 08/06/2021   Zoster Recombinant(Shingrix) 07/31/2021, 10/29/2021    Diabetes He presents for his follow-up diabetic visit. He has type 2 diabetes mellitus. Onset time: was diagnosed at approx age of 55. His disease course has been fluctuating. There are no hypoglycemic associated symptoms. Associated symptoms include blurred vision, fatigue, polydipsia and polyuria. Pertinent negatives for diabetes include no weight loss. There are no hypoglycemic complications. Symptoms are improving. Diabetic complications include nephropathy. Risk  factors for coronary artery disease include diabetes mellitus, dyslipidemia, hypertension, male sex, obesity and  sedentary lifestyle. Current diabetic treatment includes intensive insulin program. He is compliant with treatment most of the time. His weight is fluctuating minimally. He is following a generally unhealthy diet. When asked about meal planning, he reported none. He has not had a previous visit with a dietitian. He rarely participates in exercise. His home blood glucose trend is increasing steadily. His overall blood glucose range is >200 mg/dl. (He presents today with his CGM and meter showing gross hyperglycemia overall.  His most recent A1c was 6.7% on 8/20, improving from last visit of 7.6%.   Analysis of his CGM shows TIR 8%, TAR 92%, TBR 0% with a GMI of 9.8%.  He is dealing with his cirrhosis diagnosis, gets fluid drawn off every so often.) An ACE inhibitor/angiotensin II receptor blocker is being taken. He does not see a podiatrist.Eye exam is current.  Hyperlipidemia This is a chronic problem. The current episode started more than 1 year ago. The problem is controlled. Recent lipid tests were reviewed and are normal. Exacerbating diseases include chronic renal disease, diabetes and obesity. Factors aggravating his hyperlipidemia include thiazides and fatty foods. Current antihyperlipidemic treatment includes statins. The current treatment provides mild improvement of lipids. Compliance problems include adherence to diet, adherence to exercise and psychosocial issues.  Risk factors for coronary artery disease include diabetes mellitus, dyslipidemia, family history, hypertension, male sex, obesity and a sedentary lifestyle.  Hypertension This is a chronic problem. The current episode started more than 1 year ago. The problem is unchanged. The problem is uncontrolled. Associated symptoms include blurred vision. There are no associated agents to hypertension. Risk factors for coronary artery  disease include diabetes mellitus, dyslipidemia, family history, male gender, obesity and sedentary lifestyle. Past treatments include diuretics and angiotensin blockers. The current treatment provides mild improvement. Compliance problems include diet, exercise and psychosocial issues.  Hypertensive end-organ damage includes kidney disease. Identifiable causes of hypertension include chronic renal disease.     Review of systems  Constitutional: + minimally fluctuating body weight,  current Body mass index is 31.05 kg/m. , + fatigue, no subjective hyperthermia, no subjective hypothermia Eyes: + blurry vision no xerophthalmia ENT: no sore throat, no nodules palpated in throat, no dysphagia/odynophagia, no hoarseness Cardiovascular: no chest pain, no shortness of breath, no palpitations, + leg swelling Respiratory: no cough, no shortness of breath Gastrointestinal: no nausea/vomiting/diarrhea Musculoskeletal: no muscle/joint aches Skin: no rashes, no hyperemia Neurological: no tremors, no numbness, no tingling, no dizziness Psychiatric: no depression, no anxiety  Objective:     BP (!) 94/58 (BP Location: Left Arm, Patient Position: Sitting, Cuff Size: Normal)   Pulse 64   Ht 5\' 6"  (1.676 m)   Wt 192 lb 6.4 oz (87.3 kg)   BMI 31.05 kg/m   Wt Readings from Last 3 Encounters:  03/31/23 192 lb 6.4 oz (87.3 kg)  03/29/23 195 lb 8.8 oz (88.7 kg)  03/23/23 201 lb 4.8 oz (91.3 kg)     BP Readings from Last 3 Encounters:  03/31/23 (!) 94/58  03/29/23 118/80  03/23/23 116/72      Physical Exam- Limited  Constitutional:  Body mass index is 31.05 kg/m. , not in acute distress, normal state of mind Eyes:  EOMI, no exophthalmos Musculoskeletal: no gross deformities, strength intact in all four extremities, no gross restriction of joint movements Skin:  no rashes, no hyperemia, jaundiced appearance to skin and sclera of eyes Neurological: no tremor with outstretched hands  Diabetic  Foot Exam - Simple   No data  filed      CMP ( most recent) CMP     Component Value Date/Time   NA 134 (L) 03/23/2023 0944   NA 135 11/05/2022 1028   K 3.8 03/23/2023 0944   CL 97 (L) 03/23/2023 0944   CO2 29 03/23/2023 0944   GLUCOSE 254 (H) 03/23/2023 0944   BUN 7 03/23/2023 0944   BUN 5 (L) 11/05/2022 1028   CREATININE 0.76 03/23/2023 0944   CALCIUM 8.7 03/23/2023 0944   PROT 6.1 03/23/2023 0944   PROT 6.5 11/05/2022 1028   ALBUMIN 2.2 (L) 02/28/2023 0513   ALBUMIN 3.1 (L) 11/05/2022 1028   AST 62 (H) 03/23/2023 0944   ALT 29 03/23/2023 0944   ALKPHOS 140 (H) 02/28/2023 0513   BILITOT 5.6 (H) 03/23/2023 0944   BILITOT 3.3 (H) 11/05/2022 1028   GFRNONAA >60 02/28/2023 0513   GFRAA 84 07/10/2020 1005     Diabetic Labs (most recent): Lab Results  Component Value Date   HGBA1C 6.7 (H) 02/22/2023   HGBA1C 5.1 08/03/2022   HGBA1C 6.4 (A) 05/12/2022     Lipid Panel ( most recent) Lipid Panel     Component Value Date/Time   CHOL 109 11/24/2021 0817   TRIG 95 11/24/2021 0817   HDL 34 (L) 11/24/2021 0817   CHOLHDL 3.2 11/24/2021 0817   LDLCALC 57 11/24/2021 0817   LABVLDL 18 11/24/2021 0817      Lab Results  Component Value Date   TSH 1.256 08/03/2022           Assessment & Plan:   1) Controlled type 2 diabetes mellitus without complications (HCC)  He presents today with his CGM and meter showing gross hyperglycemia overall.  His most recent A1c was 6.7% on 8/20, improving from last visit of 7.6%.   Analysis of his CGM shows TIR 8%, TAR 92%, TBR 0% with a GMI of 9.8%.  He is dealing with his cirrhosis diagnosis, gets fluid drawn off every so often.  - Shawn Velazquez has currently uncontrolled symptomatic type 2 DM since 57 years of age.   -Recent labs reviewed.  - I had a long discussion with him about the progressive nature of diabetes and the pathology behind its complications. -his diabetes is complicated by chronic alcohol abuse, mild CKD and  he remains at a high risk for more acute and chronic complications which include CAD, CVA, CKD, retinopathy, and neuropathy. These are all discussed in detail with him.  The following Lifestyle Medicine recommendations according to American College of Lifestyle Medicine [facility]) were discussed and offered to patient and he agrees to start the journey:  A. Whole Foods, Plant-based plate comprising of fruits and vegetables, plant-based proteins, whole-grain carbohydrates was discussed in detail with the patient.   A list for source of those nutrients were also provided to the patient.  Patient will use only water or unsweetened tea for hydration. B.  The need to stay away from risky substances including alcohol, smoking; obtaining 7 to 9 hours of restorative sleep, at least 150 minutes of moderate intensity exercise weekly, the importance of healthy social connections,  and stress reduction techniques were discussed. C.  A full color page of  Calorie density of various food groups per pound showing examples of each food groups was provided to the patient.  - Nutritional counseling repeated at each appointment due to patients tendency to fall back in to old habits.  - The patient admits there is a room for improvement in  their diet and drink choices. -  Suggestion is made for the patient to avoid simple carbohydrates from their diet including Cakes, Sweet Desserts / Pastries, Ice Cream, Soda (diet and regular), Sweet Tea, Candies, Chips, Cookies, Sweet Pastries, Store Bought Juices, Alcohol in Excess of 1-2 drinks a day, Artificial Sweeteners, Coffee Creamer, and "Sugar-free" Products. This will help patient to have stable blood glucose profile and potentially avoid unintended weight gain.   - I encouraged the patient to switch to unprocessed or minimally processed complex starch and increased protein intake (animal or plant source), fruits, and vegetables.   - Patient is advised to stick to a routine  mealtimes to eat 3 meals a day and avoid unnecessary snacks (to snack only to correct hypoglycemia).  - I have approached him with the following individualized plan to manage  his diabetes and patient agrees:   -Avoiding hypoglycemia is a priority in his case due to new diagnosis of cirrhosis.  An acceptable target A1c for him at this point would be 6.5-7%.  -He is advised to continue his Lantus 30 units SQ nightly and change his Humalog further to 8-14 units TID with meals if glucose is above 90 and he is eating (Specific instructions on how to titrate insulin dosage based on glucose readings given to patient in writing).  He notes he cannot eat large quantities or he will get ill, so I did encourage him to eat small meals  take an additional 2 units for snacks between "meals".  He did not inject this as instructed at last visit.  I encouraged him to stick to the routine of injecting 2 units with his snacks to help prevent prandial spikes.  -he is encouraged to continue monitoring blood glucose 4 times daily (using his CGM), before meals and before bed, and to call the clinic if he has readings less than 70 or greater than 300 for 3 tests in a row.     - he is warned not to take insulin without proper monitoring per orders. - Adjustment parameters are given to him for hypo and hyperglycemia in writing.  - Specific targets for  A1c;  LDL, HDL,  and Triglycerides were discussed with the patient.  2) Blood Pressure /Hypertension:  his blood pressure is controlled to target.   Will defer changes to PCP.  3) Lipids/Hyperlipidemia:    Review of his recent lipid panel from 09/06/22 showed controlled LDL at 92.  He is not currently on lipid lowering medications likely due to elevated LFTs and cirrhosis.  4)  Weight/Diet:  his Body mass index is 31.05 kg/m.  -  clearly complicating his diabetes care.   he is a candidate for weight loss. I discussed with him the fact that loss of 5 - 10% of his  current  body weight will have the most impact on his diabetes management.  Exercise, and detailed carbohydrates information provided  -  detailed on discharge instructions.  5) Chronic Care/Health Maintenance: -he is on ACEI/ARB and Statin medications and is encouraged to initiate and continue to follow up with Ophthalmology, Dentist, Podiatrist at least yearly or according to recommendations, and advised to stay away from smoking. I have recommended yearly flu vaccine and pneumonia vaccine at least every 5 years; moderate intensity exercise for up to 150 minutes weekly; and sleep for at least 7 hours a day.  - he is advised to maintain close follow up with [name], MD for primary care needs, as well as  his other providers for optimal and coordinated care.     I spent  41  minutes in the care of the patient today including review of labs from CMP, Lipids, Thyroid Function, Hematology (current and previous including abstractions from other facilities); face-to-face time discussing  his blood glucose readings/logs, discussing hypoglycemia and hyperglycemia episodes and symptoms, medications doses, his options of short and long term treatment based on the latest standards of care / guidelines;  discussion about incorporating lifestyle medicine;  and documenting the encounter. Risk reduction counseling performed per USPSTF guidelines to reduce obesity and cardiovascular risk factors.     Please refer to Patient Instructions for Blood Glucose Monitoring and Insulin/Medications Dosing Guide"  in media tab for additional information. Please  also refer to " Patient Self Inventory" in the Media  tab for reviewed elements of pertinent patient history.  Delsa Bern participated in the discussions, expressed understanding, and voiced agreement with the above plans.  All questions were answered to his satisfaction. he is encouraged to contact clinic should he have any questions or concerns prior to his return  visit.   Follow up plan: - Return in about 3 months (around 06/30/2023) for Diabetes F/U with A1c in office, No previsit labs, Bring meter and logs.  Ronny Bacon, [facility] [address] [place], [place] [address] Phone: [phone] Fax: [phone]  03/31/2023, 11:20 AM

## 2023-04-02 ENCOUNTER — Observation Stay (HOSPITAL_COMMUNITY)
Admission: EM | Admit: 2023-04-02 | Discharge: 2023-04-05 | Disposition: A | Discharge status: Home / Self Care | Payer: BC Managed Care – PPO | Attending: Family Medicine | Admitting: Family Medicine

## 2023-04-02 ENCOUNTER — Other Ambulatory Visit: Payer: Self-pay

## 2023-04-02 ENCOUNTER — Emergency Department (HOSPITAL_COMMUNITY): Payer: BC Managed Care – PPO

## 2023-04-02 ENCOUNTER — Encounter (HOSPITAL_COMMUNITY): Payer: Self-pay | Admitting: *Deleted

## 2023-04-02 DIAGNOSIS — K7682 Hepatic encephalopathy: Secondary | ICD-10-CM | POA: Insufficient documentation

## 2023-04-02 DIAGNOSIS — E871 Hypo-osmolality and hyponatremia: Secondary | ICD-10-CM | POA: Diagnosis not present

## 2023-04-02 DIAGNOSIS — E785 Hyperlipidemia, unspecified: Secondary | ICD-10-CM | POA: Diagnosis not present

## 2023-04-02 DIAGNOSIS — Z79899 Other long term (current) drug therapy: Secondary | ICD-10-CM | POA: Diagnosis not present

## 2023-04-02 DIAGNOSIS — F17210 Nicotine dependence, cigarettes, uncomplicated: Secondary | ICD-10-CM | POA: Insufficient documentation

## 2023-04-02 DIAGNOSIS — K219 Gastro-esophageal reflux disease without esophagitis: Secondary | ICD-10-CM | POA: Diagnosis present

## 2023-04-02 DIAGNOSIS — E669 Obesity, unspecified: Secondary | ICD-10-CM | POA: Diagnosis not present

## 2023-04-02 DIAGNOSIS — R0602 Shortness of breath: Secondary | ICD-10-CM | POA: Diagnosis not present

## 2023-04-02 DIAGNOSIS — R17 Unspecified jaundice: Secondary | ICD-10-CM | POA: Diagnosis present

## 2023-04-02 DIAGNOSIS — E876 Hypokalemia: Secondary | ICD-10-CM | POA: Diagnosis not present

## 2023-04-02 DIAGNOSIS — Z794 Long term (current) use of insulin: Secondary | ICD-10-CM | POA: Insufficient documentation

## 2023-04-02 DIAGNOSIS — K7460 Unspecified cirrhosis of liver: Secondary | ICD-10-CM | POA: Diagnosis not present

## 2023-04-02 DIAGNOSIS — J811 Chronic pulmonary edema: Secondary | ICD-10-CM | POA: Diagnosis not present

## 2023-04-02 DIAGNOSIS — I10 Essential (primary) hypertension: Secondary | ICD-10-CM | POA: Insufficient documentation

## 2023-04-02 DIAGNOSIS — R55 Syncope and collapse: Secondary | ICD-10-CM | POA: Diagnosis not present

## 2023-04-02 DIAGNOSIS — Z6832 Body mass index (BMI) 32.0-32.9, adult: Secondary | ICD-10-CM | POA: Insufficient documentation

## 2023-04-02 DIAGNOSIS — E1165 Type 2 diabetes mellitus with hyperglycemia: Secondary | ICD-10-CM | POA: Insufficient documentation

## 2023-04-02 DIAGNOSIS — I959 Hypotension, unspecified: Principal | ICD-10-CM | POA: Diagnosis present

## 2023-04-02 DIAGNOSIS — F32A Depression, unspecified: Secondary | ICD-10-CM | POA: Diagnosis present

## 2023-04-02 DIAGNOSIS — F419 Anxiety disorder, unspecified: Secondary | ICD-10-CM | POA: Diagnosis present

## 2023-04-02 DIAGNOSIS — K7031 Alcoholic cirrhosis of liver with ascites: Secondary | ICD-10-CM | POA: Insufficient documentation

## 2023-04-02 DIAGNOSIS — R42 Dizziness and giddiness: Secondary | ICD-10-CM | POA: Diagnosis not present

## 2023-04-02 DIAGNOSIS — R918 Other nonspecific abnormal finding of lung field: Secondary | ICD-10-CM | POA: Diagnosis not present

## 2023-04-02 LAB — CBC WITH DIFFERENTIAL/PLATELET
Abs Immature Granulocytes: 0.01 10*3/uL (ref 0.00–0.07)
Basophils Absolute: 0.1 10*3/uL (ref 0.0–0.1)
Basophils Relative: 1 %
Eosinophils Absolute: 0.4 10*3/uL (ref 0.0–0.5)
Eosinophils Relative: 6 %
HCT: 29.2 % — ABNORMAL LOW (ref 39.0–52.0)
Hemoglobin: 10.8 g/dL — ABNORMAL LOW (ref 13.0–17.0)
Immature Granulocytes: 0 %
Lymphocytes Relative: 21 %
Lymphs Abs: 1.4 10*3/uL (ref 0.7–4.0)
MCH: 40.8 pg — ABNORMAL HIGH (ref 26.0–34.0)
MCHC: 37 g/dL — ABNORMAL HIGH (ref 30.0–36.0)
MCV: 110.2 fL — ABNORMAL HIGH (ref 80.0–100.0)
Monocytes Absolute: 0.6 10*3/uL (ref 0.1–1.0)
Monocytes Relative: 9 %
Neutro Abs: 4.1 10*3/uL (ref 1.7–7.7)
Neutrophils Relative %: 63 %
Platelets: 82 10*3/uL — ABNORMAL LOW (ref 150–400)
RBC: 2.65 MIL/uL — ABNORMAL LOW (ref 4.22–5.81)
RDW: 13.2 % (ref 11.5–15.5)
WBC: 6.5 10*3/uL (ref 4.0–10.5)
nRBC: 0 % (ref 0.0–0.2)

## 2023-04-02 LAB — COMPREHENSIVE METABOLIC PANEL
ALT: 28 U/L (ref 0–44)
AST: 66 U/L — ABNORMAL HIGH (ref 15–41)
Albumin: 2.3 g/dL — ABNORMAL LOW (ref 3.5–5.0)
Alkaline Phosphatase: 133 U/L — ABNORMAL HIGH (ref 38–126)
Anion gap: 10 (ref 5–15)
BUN: 10 mg/dL (ref 6–20)
CO2: 28 mmol/L (ref 22–32)
Calcium: 8.2 mg/dL — ABNORMAL LOW (ref 8.9–10.3)
Chloride: 92 mmol/L — ABNORMAL LOW (ref 98–111)
Creatinine, Ser: 0.85 mg/dL (ref 0.61–1.24)
GFR, Estimated: >60 mL/min (ref >60)
Glucose, Bld: 275 mg/dL — ABNORMAL HIGH (ref 70–99)
Potassium: 3.5 mmol/L (ref 3.5–5.1)
Sodium: 130 mmol/L — ABNORMAL LOW (ref 135–145)
Total Bilirubin: 7.6 mg/dL — ABNORMAL HIGH (ref 0.3–1.2)
Total Protein: 5.4 g/dL — ABNORMAL LOW (ref 6.5–8.1)

## 2023-04-02 LAB — BRAIN NATRIURETIC PEPTIDE: B Natriuretic Peptide: 341 pg/mL — ABNORMAL HIGH (ref 0.0–100.0)

## 2023-04-02 LAB — PROTIME-INR
INR: 2 — ABNORMAL HIGH (ref 0.8–1.2)
Prothrombin Time: 22.4 s — ABNORMAL HIGH (ref 11.4–15.2)

## 2023-04-02 LAB — CBG MONITORING, ED: Glucose-Capillary: 267 mg/dL — ABNORMAL HIGH (ref 70–99)

## 2023-04-02 LAB — TROPONIN I (HIGH SENSITIVITY)
Troponin I (High Sensitivity): 10 ng/L (ref <18)
Troponin I (High Sensitivity): 10 ng/L (ref <18)

## 2023-04-02 MED ORDER — SODIUM CHLORIDE 0.9 % IV BOLUS
500.0000 mL | Freq: Once | INTRAVENOUS | Status: AC
  Administered 2023-04-02: 500 mL via INTRAVENOUS

## 2023-04-02 MED ORDER — MIDODRINE HCL 5 MG PO TABS
5.0000 mg | ORAL_TABLET | Freq: Once | ORAL | Status: AC
  Administered 2023-04-02: 5 mg via ORAL
  Filled 2023-04-02: qty 1

## 2023-04-02 MED ORDER — ALBUMIN HUMAN 25 % IV SOLN
12.5000 g | Freq: Four times a day (QID) | INTRAVENOUS | Status: AC
  Administered 2023-04-02 – 2023-04-03 (×3): 12.5 g via INTRAVENOUS
  Filled 2023-04-02 (×3): qty 50

## 2023-04-02 NOTE — ED Notes (Signed)
Pt ambulated to restroom; pt stated he was a little dizzy when first standing up but dizziness went away as he stayed standing / walking to restroom; pts bp was 91/56 when pt got back in bed; pt sitting on bed, comfortable, with call light within reach

## 2023-04-02 NOTE — ED Provider Notes (Signed)
[place] EMERGENCY DEPARTMENT AT [facility] Provider Note   CSN: [id number] Arrival date & time: 04/02/23  1650     History  Chief Complaint  Patient presents with   Hypotension    Shawn Velazquez is a 57 y.o. male with past medical history of hypotension, cirrhosis of the liver, alcohol use disorder, esophageal varices, type 2 diabetes, GI bleeding and anemia who presents to the ED for hypotension.  Patient reports low blood pressure readings at home.  States he is typically 100s over 70s.  He reports compliance with midodrine although he cannot be certain because his sister manages medications.  Reports general weakness and fatigue over the last couple days.  Had recent abdominal paracentesis for ascites just over a week ago and states 4 L removed at that time.  He was short of breath prior to paracentesis but denies short of breath currently.  No chest pain, nausea, vomiting, hematemesis or other GI bleeding.  No current alcohol use.  HPI     Home Medications Prior to Admission medications   Medication Sig Start Date End Date Taking? Authorizing Provider  albuterol (VENTOLIN HFA) 108 (90 Base) MCG/ACT inhaler Inhale 2 puffs into the lungs every 6 (six) hours as needed for wheezing or shortness of breath. 02/28/23   Hongalgi, Maximino Greenland, MD  busPIRone (BUSPAR) 5 MG tablet Take 1 tablet (5 mg total) by mouth daily as needed (anxiety). 02/28/23   Hongalgi, Maximino Greenland, MD  carvedilol (COREG) 3.125 MG tablet Take 1 tablet (3.125 mg total) by mouth 2 (two) times daily with a meal. 12/09/22 03/23/23  Emokpae, Courage, MD  Continuous Glucose Sensor (DEXCOM G7 SENSOR) MISC Inject 1 application  into the skin as directed. Change sensor every 10 days as directed. 12/23/22   Dani Gobble, NP  feeding supplement (ENSURE ENLIVE / ENSURE PLUS) LIQD Take 237 mLs by mouth 2 (two) times daily between meals. 02/28/23   Hongalgi, Maximino Greenland, MD  ferrous sulfate 324 MG TBEC Take 1 tablet (324 mg  total) by mouth daily with breakfast. 12/08/22   Shon Hale, MD  FLUoxetine (PROZAC) 10 MG capsule Take 1 capsule (10 mg total) by mouth 2 (two) times daily. 02/28/23   Hongalgi, Maximino Greenland, MD  folic acid (FOLVITE) 1 MG tablet TAKE 1 TABLET BY MOUTH DAILY 01/10/23   Aida Raider, NP  furosemide (LASIX) 40 MG tablet Take 1 tablet (40 mg total) by mouth daily. 03/23/23 03/22/24  Lanelle Bal, DO  gabapentin (NEURONTIN) 100 MG capsule Take 1 tablet by mouth daily. 10/05/22   [provider]  glucose blood test strip Use as instructed to monitor glucose 4 times daily 12/09/21   Dani Gobble, NP  insulin glargine (LANTUS SOLOSTAR) 100 UNIT/ML Solostar Pen Inject 30 Units into the skin at bedtime. 12/23/22   Dani Gobble, NP  insulin lispro (HUMALOG KWIKPEN) 100 UNIT/ML KwikPen Inject 8-14 Units into the skin 3 (three) times daily. 03/31/23   Dani Gobble, NP  Insulin Pen Needle (PEN NEEDLES) 32G X 4 MM MISC 1 each by Does not apply route in the morning, at noon, in the evening, and at bedtime. Use to inject insulin 4 times daily 05/12/22   Dani Gobble, NP  lactulose (CHRONULAC) 10 GM/15ML solution Take 30 mLs (20 g total) by mouth 2 (two) times daily. 02/28/23   Hongalgi, Maximino Greenland, MD  levocetirizine (XYZAL) 5 MG tablet Take 1 tablet by mouth daily. 10/05/22  [provider]  MAGNESIUM PO Take by mouth. daily    [provider]  midodrine (PROAMATINE) 5 MG tablet Take 1 tablet (5 mg total) by mouth 3 (three) times daily with meals. To help Prevent Low Blood Pressure Patient taking differently: Take 5 mg by mouth 2 (two) times daily with a meal. To help Prevent Low Blood Pressure 12/08/22   Shon Hale, MD  Multiple Vitamin (MULTIVITAMIN) tablet Take 1 tablet by mouth daily.    [provider]  ondansetron (ZOFRAN-ODT) 4 MG disintegrating tablet Take 1 tablet (4 mg total) by mouth every 8 (eight) hours as needed for nausea or vomiting. 12/21/22    Aida Raider, NP  pantoprazole (PROTONIX) 40 MG tablet Take 1 tablet (40 mg total) by mouth daily. 02/28/23 04/29/23  Elease Etienne, MD  spironolactone (ALDACTONE) 50 MG tablet Take 2 tablets (100 mg total) by mouth daily. 03/23/23 03/22/24  Lanelle Bal, DO  thiamine (VITAMIN B-1) 100 MG tablet Take 1 tablet (100 mg total) by mouth daily. 12/08/22   Shon Hale, MD  XIFAXAN 550 MG TABS tablet TAKE 1 TABLET BY MOUTH TWICE DAILY 02/14/23 02/14/24  Aida Raider, NP      Allergies    Neosporin + pain relief max st [neomy-bacit-polymyx-pramoxine]    Review of Systems   Review of Systems  Physical Exam Updated Vital Signs BP (!) 81/52   Pulse (!) 54   Temp 97.8 F (36.6 C) (Oral)   Resp 16   Ht 5\' 6"  (1.676 m)   Wt 84.8 kg   SpO2 94%   BMI 30.18 kg/m  Physical Exam Vitals and nursing note reviewed.  HENT:     Head: Normocephalic and atraumatic.  Eyes:     Pupils: Pupils are equal, round, and reactive to light.  Cardiovascular:     Rate and Rhythm: Normal rate and regular rhythm.  Pulmonary:     Effort: Pulmonary effort is normal.     Breath sounds: Normal breath sounds.  Abdominal:     Palpations: Abdomen is soft.     Tenderness: There is no abdominal tenderness.     Comments: Protuberant abdomen without fluid wave No rebound rigidity guarding  Skin:    General: Skin is warm and dry.  Neurological:     Mental Status: He is alert.  Psychiatric:        Mood and Affect: Mood normal.     ED Results / Procedures / Treatments   Labs (all labs ordered are listed, but only abnormal results are displayed) Labs Reviewed  COMPREHENSIVE METABOLIC PANEL - Abnormal; Notable for the following components:      Result Value   Sodium 130 (*)    Chloride 92 (*)    Glucose, Bld 275 (*)    Calcium 8.2 (*)    Total Protein 5.4 (*)    Albumin 2.3 (*)    AST 66 (*)    Alkaline Phosphatase 133 (*)    Total Bilirubin 7.6 (*)    All other components within normal  limits  CBC WITH DIFFERENTIAL/PLATELET - Abnormal; Notable for the following components:   RBC 2.65 (*)    Hemoglobin 10.8 (*)    HCT 29.2 (*)    MCV 110.2 (*)    MCH 40.8 (*)    MCHC 37.0 (*)    Platelets 82 (*)    All other components within normal limits  BRAIN NATRIURETIC PEPTIDE - Abnormal; Notable for the following components:  B Natriuretic Peptide 341.0 (*)    All other components within normal limits  PROTIME-INR - Abnormal; Notable for the following components:   Prothrombin Time 22.4 (*)    INR 2.0 (*)    All other components within normal limits  CBG MONITORING, ED - Abnormal; Notable for the following components:   Glucose-Capillary 267 (*)    All other components within normal limits  TROPONIN I (HIGH SENSITIVITY)  TROPONIN I (HIGH SENSITIVITY)    EKG None  Radiology DG Chest Port 1 View  Result Date: 04/02/2023 CLINICAL DATA:  Edema. Low blood pressure. Generalized weakness and dizziness. EXAM: PORTABLE CHEST 1 VIEW COMPARISON:  Chest radiograph dated 02/26/2023. FINDINGS: 6 small left pleural effusion and left lung base atelectasis or infiltrate. There is background of chronic interstitial coarsening. No pneumothorax. Stable cardiac silhouette. No acute osseous pathology. IMPRESSION: Small left pleural effusion and left lung base atelectasis or infiltrate. Electronically Signed   By: Elgie Collard M.D.   On: 04/02/2023 18:22    Procedures Procedures    Medications Ordered in ED Medications  sodium chloride 0.9 % bolus 500 mL (has no administration in time range)  albumin human 25 % solution 12.5 g (has no administration in time range)  midodrine (PROAMATINE) tablet 5 mg (5 mg Oral Given 04/02/23 1824)    ED Course/ Medical Decision Making/ A&P Clinical Course as of 04/02/23 2330  Sat Apr 02, 2023  2328 Laboratory workup notable for hypoalbuminemia.  No leukocytosis or significant anemia requiring transfusion.  High-sensitivity troponin is 10.   Suspicion for ACS.  EKG shows no ischemic findings or evidence of dysrhythmia.  Chest x-ray shows pleural effusion small unlikely to represent pneumonia as he has no white count productive cough or fevers.  Reassessed patient.  Patient states he was very unsteady on his feet while ambulating to the bathroom.  Considering he is symptomatic from his hypotension despite his home dose of midodrine will admit to medicine.  Discussed admitting hospitalist who accepts patient for admission [MP]    Clinical Course User Index [MP] Royanne Foots, DO                                 Medical Decision Making 57 year old male with history as above presenting for hypotension and general malaise.  Blood pressure high 80s over 60s repeatedly here in the ED.  Lower from baseline of 100s over 70s.  He is currently on midodrine.  No infectious symptoms of reported GI bleeding.  Most likely etiology would be known history of hypotension in the setting of hypervolemia and cirrhosis of the liver however will obtain laboratory workup to look for evidence of anemia given history of esophageal varices requiring GI intervention.  # no reported GI bleeding here today.  Will also obtain chest x-ray to look for any evidence of pneumonia and high sensitive troponin EKG to evaluate for ACS as well as dysrhythmia as potential etiology of hypotension.  Will provide him with a home dose of midodrine while he is here in the ED and reassess  Amount and/or Complexity of Data Reviewed Labs: ordered. Radiology: ordered.  Risk Prescription drug management. Decision regarding hospitalization.           Final Clinical Impression(s) / ED Diagnoses Final diagnoses:  Hypotension, unspecified hypotension type  Near syncope  Hepatic cirrhosis, unspecified hepatic cirrhosis type, unspecified whether ascites present (HCC)    Rx / DC  Orders ED Discharge Orders     None         Royanne Foots, DO 04/02/23 2330

## 2023-04-02 NOTE — ED Triage Notes (Signed)
Pt with low BP (85/40) at home and with c/o generalized weakness and dizziness. Pt is on HTN med and has taken med in morning.

## 2023-04-03 ENCOUNTER — Observation Stay (HOSPITAL_COMMUNITY): Payer: BC Managed Care – PPO

## 2023-04-03 DIAGNOSIS — F17210 Nicotine dependence, cigarettes, uncomplicated: Secondary | ICD-10-CM | POA: Diagnosis not present

## 2023-04-03 DIAGNOSIS — F419 Anxiety disorder, unspecified: Secondary | ICD-10-CM

## 2023-04-03 DIAGNOSIS — I952 Hypotension due to drugs: Secondary | ICD-10-CM | POA: Diagnosis not present

## 2023-04-03 DIAGNOSIS — I10 Essential (primary) hypertension: Secondary | ICD-10-CM | POA: Diagnosis not present

## 2023-04-03 DIAGNOSIS — I959 Hypotension, unspecified: Secondary | ICD-10-CM

## 2023-04-03 DIAGNOSIS — K7031 Alcoholic cirrhosis of liver with ascites: Secondary | ICD-10-CM | POA: Diagnosis not present

## 2023-04-03 DIAGNOSIS — E669 Obesity, unspecified: Secondary | ICD-10-CM | POA: Diagnosis not present

## 2023-04-03 DIAGNOSIS — F32A Depression, unspecified: Secondary | ICD-10-CM

## 2023-04-03 DIAGNOSIS — Z6832 Body mass index (BMI) 32.0-32.9, adult: Secondary | ICD-10-CM | POA: Diagnosis not present

## 2023-04-03 DIAGNOSIS — E782 Mixed hyperlipidemia: Secondary | ICD-10-CM

## 2023-04-03 DIAGNOSIS — R188 Other ascites: Secondary | ICD-10-CM | POA: Diagnosis not present

## 2023-04-03 DIAGNOSIS — E785 Hyperlipidemia, unspecified: Secondary | ICD-10-CM | POA: Diagnosis not present

## 2023-04-03 DIAGNOSIS — R0602 Shortness of breath: Secondary | ICD-10-CM | POA: Diagnosis not present

## 2023-04-03 DIAGNOSIS — R140 Abdominal distension (gaseous): Secondary | ICD-10-CM | POA: Diagnosis not present

## 2023-04-03 DIAGNOSIS — E876 Hypokalemia: Secondary | ICD-10-CM | POA: Diagnosis not present

## 2023-04-03 DIAGNOSIS — K7682 Hepatic encephalopathy: Secondary | ICD-10-CM | POA: Diagnosis not present

## 2023-04-03 DIAGNOSIS — K2100 Gastro-esophageal reflux disease with esophagitis, without bleeding: Secondary | ICD-10-CM | POA: Diagnosis not present

## 2023-04-03 DIAGNOSIS — E871 Hypo-osmolality and hyponatremia: Secondary | ICD-10-CM | POA: Diagnosis not present

## 2023-04-03 DIAGNOSIS — E1165 Type 2 diabetes mellitus with hyperglycemia: Secondary | ICD-10-CM | POA: Diagnosis not present

## 2023-04-03 DIAGNOSIS — Z79899 Other long term (current) drug therapy: Secondary | ICD-10-CM | POA: Diagnosis not present

## 2023-04-03 DIAGNOSIS — R17 Unspecified jaundice: Secondary | ICD-10-CM | POA: Diagnosis not present

## 2023-04-03 DIAGNOSIS — Z794 Long term (current) use of insulin: Secondary | ICD-10-CM | POA: Diagnosis not present

## 2023-04-03 DIAGNOSIS — K7460 Unspecified cirrhosis of liver: Secondary | ICD-10-CM | POA: Diagnosis not present

## 2023-04-03 LAB — CBC WITH DIFFERENTIAL/PLATELET
Abs Immature Granulocytes: 0.02 10*3/uL (ref 0.00–0.07)
Basophils Absolute: 0.1 10*3/uL (ref 0.0–0.1)
Basophils Relative: 1 %
Eosinophils Absolute: 0.5 10*3/uL (ref 0.0–0.5)
Eosinophils Relative: 6 %
HCT: 29 % — ABNORMAL LOW (ref 39.0–52.0)
Hemoglobin: 9.9 g/dL — ABNORMAL LOW (ref 13.0–17.0)
Immature Granulocytes: 0 %
Lymphocytes Relative: 23 %
Lymphs Abs: 1.8 10*3/uL (ref 0.7–4.0)
MCH: 37.4 pg — ABNORMAL HIGH (ref 26.0–34.0)
MCHC: 34.1 g/dL (ref 30.0–36.0)
MCV: 109.4 fL — ABNORMAL HIGH (ref 80.0–100.0)
Monocytes Absolute: 0.8 10*3/uL (ref 0.1–1.0)
Monocytes Relative: 11 %
Neutro Abs: 4.4 10*3/uL (ref 1.7–7.7)
Neutrophils Relative %: 59 %
Platelets: 78 10*3/uL — ABNORMAL LOW (ref 150–400)
RBC: 2.65 MIL/uL — ABNORMAL LOW (ref 4.22–5.81)
RDW: 13.2 % (ref 11.5–15.5)
WBC: 7.5 10*3/uL (ref 4.0–10.5)
nRBC: 0 % (ref 0.0–0.2)

## 2023-04-03 LAB — COMPREHENSIVE METABOLIC PANEL
ALT: 22 U/L (ref 0–44)
AST: 58 U/L — ABNORMAL HIGH (ref 15–41)
Albumin: 2.4 g/dL — ABNORMAL LOW (ref 3.5–5.0)
Alkaline Phosphatase: 126 U/L (ref 38–126)
Anion gap: 9 (ref 5–15)
BUN: 10 mg/dL (ref 6–20)
CO2: 29 mmol/L (ref 22–32)
Calcium: 8.2 mg/dL — ABNORMAL LOW (ref 8.9–10.3)
Chloride: 94 mmol/L — ABNORMAL LOW (ref 98–111)
Creatinine, Ser: 0.81 mg/dL (ref 0.61–1.24)
GFR, Estimated: >60 mL/min (ref >60)
Glucose, Bld: 191 mg/dL — ABNORMAL HIGH (ref 70–99)
Potassium: 3.2 mmol/L — ABNORMAL LOW (ref 3.5–5.1)
Sodium: 132 mmol/L — ABNORMAL LOW (ref 135–145)
Total Bilirubin: 6.9 mg/dL — ABNORMAL HIGH (ref 0.3–1.2)
Total Protein: 5.2 g/dL — ABNORMAL LOW (ref 6.5–8.1)

## 2023-04-03 LAB — GLUCOSE, CAPILLARY
Glucose-Capillary: 261 mg/dL — ABNORMAL HIGH (ref 70–99)
Glucose-Capillary: 155 mg/dL — ABNORMAL HIGH (ref 70–99)
Glucose-Capillary: 193 mg/dL — ABNORMAL HIGH (ref 70–99)

## 2023-04-03 LAB — CBG MONITORING, ED: Glucose-Capillary: 156 mg/dL — ABNORMAL HIGH (ref 70–99)

## 2023-04-03 LAB — MAGNESIUM: Magnesium: 1.4 mg/dL — ABNORMAL LOW (ref 1.7–2.4)

## 2023-04-03 MED ORDER — THIAMINE MONONITRATE 100 MG PO TABS
100.0000 mg | ORAL_TABLET | Freq: Every day | ORAL | Status: DC
  Administered 2023-04-03 – 2023-04-05 (×3): 100 mg via ORAL
  Filled 2023-04-03 (×3): qty 1

## 2023-04-03 MED ORDER — BUSPIRONE HCL 5 MG PO TABS
5.0000 mg | ORAL_TABLET | Freq: Every day | ORAL | Status: DC | PRN
  Administered 2023-04-04: 5 mg via ORAL
  Filled 2023-04-03: qty 1

## 2023-04-03 MED ORDER — LACTULOSE 10 GM/15ML PO SOLN
20.0000 g | Freq: Two times a day (BID) | ORAL | Status: DC
  Administered 2023-04-03 – 2023-04-05 (×5): 20 g via ORAL
  Filled 2023-04-03 (×5): qty 30

## 2023-04-03 MED ORDER — INSULIN ASPART 100 UNIT/ML IJ SOLN: 0.0000 [IU] | Freq: Every day | INTRAMUSCULAR | Status: DC

## 2023-04-03 MED ORDER — INSULIN GLARGINE-YFGN 100 UNIT/ML ~~LOC~~ SOLN
15.0000 [IU] | Freq: Every day | SUBCUTANEOUS | Status: DC
  Administered 2023-04-03 – 2023-04-05 (×3): 15 [IU] via SUBCUTANEOUS
  Filled 2023-04-03 (×4): qty 0.15

## 2023-04-03 MED ORDER — FLUOXETINE HCL 10 MG PO CAPS
10.0000 mg | ORAL_CAPSULE | Freq: Two times a day (BID) | ORAL | Status: DC
  Administered 2023-04-03 – 2023-04-05 (×6): 10 mg via ORAL
  Filled 2023-04-03 (×6): qty 1

## 2023-04-03 MED ORDER — OXYCODONE HCL 5 MG PO TABS: 5.0000 mg | ORAL_TABLET | ORAL | Status: DC | PRN

## 2023-04-03 MED ORDER — ALBUTEROL SULFATE HFA 108 (90 BASE) MCG/ACT IN AERS: 2.0000 | INHALATION_SPRAY | RESPIRATORY_TRACT | Status: DC | PRN

## 2023-04-03 MED ORDER — INSULIN ASPART 100 UNIT/ML IJ SOLN
0.0000 [IU] | Freq: Three times a day (TID) | INTRAMUSCULAR | Status: DC
  Administered 2023-04-03: 3 [IU] via SUBCUTANEOUS
  Administered 2023-04-03: 8 [IU] via SUBCUTANEOUS
  Administered 2023-04-03 – 2023-04-04 (×2): 3 [IU] via SUBCUTANEOUS
  Administered 2023-04-04: 8 [IU] via SUBCUTANEOUS
  Administered 2023-04-04: 3 [IU] via SUBCUTANEOUS
  Administered 2023-04-05: 5 [IU] via SUBCUTANEOUS
  Filled 2023-04-03: qty 1

## 2023-04-03 MED ORDER — FOLIC ACID 1 MG PO TABS
1.0000 mg | ORAL_TABLET | Freq: Every day | ORAL | Status: DC
  Administered 2023-04-03 – 2023-04-05 (×3): 1 mg via ORAL
  Filled 2023-04-03 (×3): qty 1

## 2023-04-03 MED ORDER — INSULIN GLARGINE-YFGN 100 UNIT/ML ~~LOC~~ SOLN
15.0000 [IU] | Freq: Every day | SUBCUTANEOUS | Status: DC
  Filled 2023-04-03: qty 0.15

## 2023-04-03 MED ORDER — ACETAMINOPHEN 325 MG PO TABS: 650.0000 mg | ORAL_TABLET | Freq: Four times a day (QID) | ORAL | Status: DC | PRN

## 2023-04-03 MED ORDER — RIFAXIMIN 550 MG PO TABS
550.0000 mg | ORAL_TABLET | Freq: Two times a day (BID) | ORAL | Status: DC
  Administered 2023-04-03 – 2023-04-05 (×6): 550 mg via ORAL
  Filled 2023-04-03 (×6): qty 1

## 2023-04-03 MED ORDER — POTASSIUM CHLORIDE CRYS ER 20 MEQ PO TBCR
40.0000 meq | EXTENDED_RELEASE_TABLET | ORAL | Status: AC
  Administered 2023-04-03 (×2): 40 meq via ORAL
  Filled 2023-04-03 (×2): qty 2

## 2023-04-03 MED ORDER — SODIUM CHLORIDE 0.9 % IV SOLN: INTRAVENOUS | Status: DC

## 2023-04-03 MED ORDER — GABAPENTIN 100 MG PO CAPS
100.0000 mg | ORAL_CAPSULE | Freq: Every day | ORAL | Status: DC
  Administered 2023-04-03 – 2023-04-04 (×3): 100 mg via ORAL
  Filled 2023-04-03 (×3): qty 1

## 2023-04-03 MED ORDER — SPIRONOLACTONE 100 MG PO TABS: 100.0000 mg | ORAL_TABLET | Freq: Every day | ORAL | Status: DC

## 2023-04-03 MED ORDER — INSULIN GLARGINE-YFGN 100 UNIT/ML ~~LOC~~ SOLN: 15.0000 [IU] | Freq: Two times a day (BID) | SUBCUTANEOUS | Status: DC

## 2023-04-03 MED ORDER — CHLORHEXIDINE GLUCONATE CLOTH 2 % EX PADS
6.0000 | MEDICATED_PAD | Freq: Every day | CUTANEOUS | Status: DC
  Administered 2023-04-03 – 2023-04-05 (×3): 6 via TOPICAL

## 2023-04-03 MED ORDER — ONDANSETRON HCL 4 MG PO TABS: 4.0000 mg | ORAL_TABLET | Freq: Four times a day (QID) | ORAL | Status: DC | PRN

## 2023-04-03 MED ORDER — ONDANSETRON HCL 4 MG/2ML IJ SOLN: 4.0000 mg | Freq: Four times a day (QID) | INTRAMUSCULAR | Status: DC | PRN

## 2023-04-03 MED ORDER — MORPHINE SULFATE (PF) 2 MG/ML IV SOLN: 2.0000 mg | INTRAVENOUS | Status: DC | PRN

## 2023-04-03 MED ORDER — MIDODRINE HCL 5 MG PO TABS
5.0000 mg | ORAL_TABLET | Freq: Three times a day (TID) | ORAL | Status: DC
  Administered 2023-04-03 – 2023-04-05 (×7): 5 mg via ORAL
  Filled 2023-04-03 (×7): qty 1

## 2023-04-03 MED ORDER — MAGNESIUM SULFATE 2 GM/50ML IV SOLN
2.0000 g | Freq: Once | INTRAVENOUS | Status: AC
  Administered 2023-04-03: 2 g via INTRAVENOUS
  Filled 2023-04-03: qty 50

## 2023-04-03 MED ORDER — PANTOPRAZOLE SODIUM 40 MG PO TBEC
40.0000 mg | DELAYED_RELEASE_TABLET | Freq: Every day | ORAL | Status: DC
  Administered 2023-04-03 – 2023-04-05 (×3): 40 mg via ORAL
  Filled 2023-04-03 (×3): qty 1

## 2023-04-03 MED ORDER — MIDODRINE HCL 5 MG PO TABS: 5.0000 mg | ORAL_TABLET | Freq: Two times a day (BID) | ORAL | Status: DC

## 2023-04-03 MED ORDER — ALBUTEROL SULFATE (2.5 MG/3ML) 0.083% IN NEBU: 2.5000 mg | INHALATION_SOLUTION | RESPIRATORY_TRACT | Status: DC | PRN

## 2023-04-03 MED ORDER — INSULIN ASPART 100 UNIT/ML IJ SOLN
4.0000 [IU] | Freq: Three times a day (TID) | INTRAMUSCULAR | Status: DC
  Administered 2023-04-03 – 2023-04-05 (×4): 4 [IU] via SUBCUTANEOUS

## 2023-04-03 MED ORDER — ACETAMINOPHEN 650 MG RE SUPP: 650.0000 mg | Freq: Four times a day (QID) | RECTAL | Status: DC | PRN

## 2023-04-03 NOTE — Assessment & Plan Note (Signed)
-   Takes 30 units of basal insulin at home - Continue reduced dose here - Sliding scale coverage - Monitor CBG

## 2023-04-03 NOTE — Assessment & Plan Note (Signed)
-   Soft blood pressures down to 80s over 50s - Reports normal appetite - Compliant with blood pressure medication but reports it is not possible that he accidentally overdosed on blood pressure medication - Patient is also bradycardic - Hold, Lasix, spironolactone - Troponin 10, 10 - Midodrine given in the ED - Continue PTA medication midodrine - Continue IV fluids - Continue to monitor

## 2023-04-03 NOTE — Assessment & Plan Note (Addendum)
-   Bilirubin 7.6 - This is up from 5.6 - Patient appears jaundiced on exam - Consult GI for liver decompensation

## 2023-04-03 NOTE — Consult Note (Signed)
Vista Lawman, M.D. Gastroenterology & Hepatology                                           Patient Name: Shawn Velazquez  MRN: [id number] Admission Date: 04/02/2023 Date of Evaluation:  04/03/2023 Time of Evaluation: 10:00 AM  Chief Complaint:    HPI:  This is a 57 y.o. male with history of alcoholic hepatitis, decompensated alcohol cirrhosis complicated by portal hypertension, ascites, esophageal varices status post multiple bleeds with subsequent EVL, hepatic encephalopathy presented with hypotension    Patient reports that he was at home where he felt dizzy and fatigue checked his blood pressure at home and he was low.  He showed me a list of his medication with Coreg as listed twice a day.  Denies taking any extra doses than regularly prescribed.  Patient had paracentesis of removal of 4 L on 03/23/2023.  Denied any fever or chills or abdominal pain Patient was found to be bradycardiac to 50s hypotensive to 85/63 Last upper endoscopy 8/20/2024with grade 2 esophageal varices noted, stigmata of recent bleeding, banding x 3 . history of noncompliance prior and ongoing alcohol use.  Patient states he continues to drink a few cans of beer weekly, last beer was last week    Labs with sodium 130 proBNP 341 troponin negative WBC 6.5 hemoglobin 10.8 platelet 82  Procedure history:  8/20/2024with grade 2 esophageal varices noted, stigmata of recent bleeding, banding x 3 .   EGD 08/26/2022: -grade 2 esophageal varices s/p banding x 3 with complete eradication, without bleeding -Single nonbleeding angiodysplastic lesion in the duodenum.  -Advised Carafate suspension 1 g before meals and nightly -Repeat EGD in 4-8 weeks.   EGD 09/24/22: -4 columns of grade 2-3 esophageal varices, cherry red spots 1 on each of 2 columns without active bleeding -Portal hypertensive gastropathy with touch friability, no gastric varices -Microvasive 7 shot Bander patch -1 band placed on each column with good  hemostasis -Advise repeat banding in 4 weeks. -Continue PPI   EGD 02/22/2023: -3 columns of grade esophageal varices, without active bleeding, scarring from prior banding s/p banding x3 -Portal hypertensive gastropathy, no gastric varices  Past Medical History: SEE CHRONIC ISSSUES: Past Medical History:  Diagnosis Date   Anxiety    Arthritis    affects hands, shoulder, neck, knees, hips, ankles, toes   Chronic back pain    Diabetes mellitus    diet controlled   Eczema    Esophageal varices (HCC)    Fatty liver, alcoholic    GERD (gastroesophageal reflux disease)    Glaucoma    slight case   Hypertension    Iron deficiency anemia due to chronic blood loss 11/25/2022   Psoriasis    Sleep apnea    Substance abuse (HCC)    Vertigo 09/11/2017   Wears glasses    Past Surgical History:  Past Surgical History:  Procedure Laterality Date   COLONOSCOPY WITH PROPOFOL N/A 04/05/2022   Procedure: COLONOSCOPY WITH PROPOFOL;  Surgeon: Lanelle Bal, DO;  [facility];  Service: Endoscopy;  Laterality: N/A;  10:45 AM,unable to reach pt to move up   ESOPHAGEAL BANDING N/A 08/26/2022   Procedure: ESOPHAGEAL BANDING;  Surgeon: Lanelle Bal, DO;  [facility]: [facility];  Service: Endoscopy;  Laterality: N/A;   ESOPHAGEAL BANDING  09/24/2022   Procedure: ESOPHAGEAL BANDING;  Surgeon:  Corbin Ade, MD;  [facility]: [facility];  Service: Endoscopy;;   ESOPHAGEAL BANDING  02/22/2023   Procedure: ESOPHAGEAL BANDING;  Surgeon: Marguerita Merles, Reuel Boom, MD;  [facility]: [facility];  Service: Gastroenterology;;   ESOPHAGOGASTRODUODENOSCOPY (EGD) WITH PROPOFOL N/A 08/26/2022   Procedure: ESOPHAGOGASTRODUODENOSCOPY (EGD) WITH PROPOFOL;  Surgeon: Lanelle Bal, DO;  [facility];  Service: Endoscopy;  Laterality: N/A;   ESOPHAGOGASTRODUODENOSCOPY (EGD) WITH PROPOFOL N/A 09/24/2022   Procedure: ESOPHAGOGASTRODUODENOSCOPY (EGD) WITH PROPOFOL;  Surgeon: Corbin Ade,  MD;  [facility]: [facility];  Service: Endoscopy;  Laterality: N/A;  hematemesis, esophageal varices, melena   ESOPHAGOGASTRODUODENOSCOPY (EGD) WITH PROPOFOL N/A 02/22/2023   Procedure: ESOPHAGOGASTRODUODENOSCOPY (EGD) WITH PROPOFOL;  Surgeon: Dolores Frame, MD;  [facility]: [facility];  Service: Gastroenterology;  Laterality: N/A;   MULTIPLE TOOTH EXTRACTIONS     POLYPECTOMY  04/05/2022   Procedure: POLYPECTOMY;  Surgeon: Lanelle Bal, DO;  [facility]: [facility];  Service: Endoscopy;;   POSTERIOR CERVICAL LAMINECTOMY  04/21/2012   Procedure: POSTERIOR CERVICAL LAMINECTOMY;  Surgeon: Mariam Dollar, MD;  Location: [facility];  Service: Neurosurgery;  Laterality: Left;  Posterior Cervical laminectomy/foraminotomy and diskectomy, left cervical seven-thoracic one   ROTATOR CUFF REPAIR     THORACIC DISCECTOMY Left 04/03/2014   Procedure: Left Thoracic Seven to Eight, Thoracic Eight to Nine Thoracic Discectomy ;  Surgeon: Mariam Dollar, MD;  Location: [facility];  Service: Neurosurgery;  Laterality: Left;   Family History:  Family History  Problem Relation Age of Onset   Diabetes Father    Cancer - Lung Father    Hypertension Other    Cancer - Lung Other    Social History:  Social History   Tobacco Use   Smoking status: Every Day    Current packs/day: 0.00    Average packs/day: 0.3 packs/day for 40.0 years (10.0 ttl pk-yrs)    Types: Cigarettes    Start date: 05/09/1982    Last attempt to quit: 05/09/2022    Years since quitting: 0.9   Smokeless tobacco: Never   Tobacco comments:    pt trying to quit on his own  Vaping Use   Vaping status: Former  Substance Use Topics   Alcohol use: Not Currently    Alcohol/week: 12.0 standard drinks of alcohol    Types: 12 Cans of beer per week    Comment: last drink 2 beers on Sunday 02/20/2023   Drug use: No    Home Medications:  Prior to Admission medications   Medication Sig Start Date End Date Taking? Authorizing Provider   albuterol (VENTOLIN HFA) 108 (90 Base) MCG/ACT inhaler Inhale 2 puffs into the lungs every 6 (six) hours as needed for wheezing or shortness of breath. 02/28/23   Hongalgi, Maximino Greenland, MD  busPIRone (BUSPAR) 5 MG tablet Take 1 tablet (5 mg total) by mouth daily as needed (anxiety). 02/28/23   Hongalgi, Maximino Greenland, MD  carvedilol (COREG) 3.125 MG tablet Take 1 tablet (3.125 mg total) by mouth 2 (two) times daily with a meal. 12/09/22 03/23/23  Emokpae, Courage, MD  Continuous Glucose Sensor (DEXCOM G7 SENSOR) MISC Inject 1 application  into the skin as directed. Change sensor every 10 days as directed. 12/23/22   Dani Gobble, NP  feeding supplement (ENSURE ENLIVE / ENSURE PLUS) LIQD Take 237 mLs by mouth 2 (two) times daily between meals. 02/28/23   Hongalgi, Maximino Greenland, MD  ferrous sulfate 324 MG TBEC Take 1 tablet (  324 mg total) by mouth daily with breakfast. 12/08/22   Shon Hale, MD  FLUoxetine (PROZAC) 10 MG capsule Take 1 capsule (10 mg total) by mouth 2 (two) times daily. 02/28/23   Hongalgi, Maximino Greenland, MD  folic acid (FOLVITE) 1 MG tablet TAKE 1 TABLET BY MOUTH DAILY 01/10/23   Aida Raider, NP  furosemide (LASIX) 40 MG tablet Take 1 tablet (40 mg total) by mouth daily. 03/23/23 03/22/24  Lanelle Bal, DO  gabapentin (NEURONTIN) 100 MG capsule Take 1 tablet by mouth daily. 10/05/22   [provider]  glucose blood test strip Use as instructed to monitor glucose 4 times daily 12/09/21   Dani Gobble, NP  insulin glargine (LANTUS SOLOSTAR) 100 UNIT/ML Solostar Pen Inject 30 Units into the skin at bedtime. 12/23/22   Dani Gobble, NP  insulin lispro (HUMALOG KWIKPEN) 100 UNIT/ML KwikPen Inject 8-14 Units into the skin 3 (three) times daily. 03/31/23   Dani Gobble, NP  Insulin Pen Needle (PEN NEEDLES) 32G X 4 MM MISC 1 each by Does not apply route in the morning, at noon, in the evening, and at bedtime. Use to inject insulin 4 times daily 05/12/22   Dani Gobble, NP   lactulose (CHRONULAC) 10 GM/15ML solution Take 30 mLs (20 g total) by mouth 2 (two) times daily. 02/28/23   Hongalgi, Maximino Greenland, MD  levocetirizine (XYZAL) 5 MG tablet Take 1 tablet by mouth daily. 10/05/22   [provider]  MAGNESIUM PO Take by mouth. daily    [provider]  midodrine (PROAMATINE) 5 MG tablet Take 1 tablet (5 mg total) by mouth 3 (three) times daily with meals. To help Prevent Low Blood Pressure Patient taking differently: Take 5 mg by mouth 2 (two) times daily with a meal. To help Prevent Low Blood Pressure 12/08/22   Shon Hale, MD  Multiple Vitamin (MULTIVITAMIN) tablet Take 1 tablet by mouth daily.    [provider]  ondansetron (ZOFRAN-ODT) 4 MG disintegrating tablet Take 1 tablet (4 mg total) by mouth every 8 (eight) hours as needed for nausea or vomiting. 12/21/22   Aida Raider, NP  pantoprazole (PROTONIX) 40 MG tablet Take 1 tablet (40 mg total) by mouth daily. 02/28/23 04/29/23  Elease Etienne, MD  spironolactone (ALDACTONE) 50 MG tablet Take 2 tablets (100 mg total) by mouth daily. 03/23/23 03/22/24  Lanelle Bal, DO  thiamine (VITAMIN B-1) 100 MG tablet Take 1 tablet (100 mg total) by mouth daily. 12/08/22   Shon Hale, MD  XIFAXAN 550 MG TABS tablet TAKE 1 TABLET BY MOUTH TWICE DAILY 02/14/23 02/14/24  Aida Raider, NP    Inpatient Medications:  Current Facility-Administered Medications:    acetaminophen (TYLENOL) tablet 650 mg, 650 mg, Oral, Q6H PRN **OR** acetaminophen (TYLENOL) suppository 650 mg, 650 mg, Rectal, Q6H PRN, Zierle-Ghosh, Asia B, DO   albumin human 25 % solution 12.5 g, 12.5 g, Intravenous, Q6H, Zierle-Ghosh, Asia B, DO, Stopped at 04/03/23 0816   albuterol (PROVENTIL) (2.5 MG/3ML) 0.083% nebulizer solution 2.5 mg, 2.5 mg, Nebulization, Q4H PRN, Zierle-Ghosh, Asia B, DO   busPIRone (BUSPAR) tablet 5 mg, 5 mg, Oral, Daily PRN, Zierle-Ghosh, Asia B, DO   Chlorhexidine Gluconate Cloth 2 % PADS 6 each, 6  each, Topical, Q0600, Candelaria Stagers T, MD, 6 each at 04/03/23 0928   FLUoxetine (PROZAC) capsule 10 mg, 10 mg, Oral, BID, Zierle-Ghosh, Asia B, DO, 10 mg at 04/03/23 0928   gabapentin (NEURONTIN) capsule  100 mg, 100 mg, Oral, QHS, Zierle-Ghosh, Asia B, DO, 100 mg at 04/03/23 0109   insulin aspart (novoLOG) injection 0-15 Units, 0-15 Units, Subcutaneous, TID WC, Zierle-Ghosh, Asia B, DO, 3 Units at 04/03/23 0829   insulin aspart (novoLOG) injection 0-5 Units, 0-5 Units, Subcutaneous, QHS, Zierle-Ghosh, Asia B, DO   insulin glargine-yfgn (SEMGLEE) injection 15 Units, 15 Units, Subcutaneous, QHS, Zierle-Ghosh, Asia B, DO   lactulose (CHRONULAC) 10 GM/15ML solution 20 g, 20 g, Oral, BID, Zierle-Ghosh, Asia B, DO, 20 g at 04/03/23 0927   midodrine (PROAMATINE) tablet 5 mg, 5 mg, Oral, TID WC, Gonfa, Taye T, MD, 5 mg at 04/03/23 0818   morphine (PF) 2 MG/ML injection 2 mg, 2 mg, Intravenous, Q2H PRN, Zierle-Ghosh, Asia B, DO   ondansetron (ZOFRAN) tablet 4 mg, 4 mg, Oral, Q6H PRN **OR** ondansetron (ZOFRAN) injection 4 mg, 4 mg, Intravenous, Q6H PRN, Zierle-Ghosh, Asia B, DO   oxyCODONE (Oxy IR/ROXICODONE) immediate release tablet 5 mg, 5 mg, Oral, Q4H PRN, Zierle-Ghosh, Asia B, DO   pantoprazole (PROTONIX) EC tablet 40 mg, 40 mg, Oral, Daily, Zierle-Ghosh, Asia B, DO, 40 mg at 04/03/23 0928   potassium chloride SA (KLOR-CON M) CR tablet 40 mEq, 40 mEq, Oral, Q4H, Gonfa, Taye T, MD, 40 mEq at 04/03/23 0818   rifaximin (XIFAXAN) tablet 550 mg, 550 mg, Oral, BID, Zierle-Ghosh, Asia B, DO, 550 mg at 04/03/23 9528   thiamine (VITAMIN B1) tablet 100 mg, 100 mg, Oral, Daily, Zierle-Ghosh, Asia B, DO, 100 mg at 04/03/23 4132 Allergies: Neosporin + pain relief max st [neomy-bacit-polymyx-pramoxine]  Complete Review of Systems: GENERAL: negative for malaise, night sweats HEENT: No changes in hearing or vision, no nose bleeds or other nasal problems. NECK: Negative for lumps, goiter, pain and significant neck  swelling RESPIRATORY: Negative for cough, wheezing CARDIOVASCULAR: Negative for chest pain, leg swelling, palpitations, orthopnea GI: SEE HPI MUSCULOSKELETAL: Negative for joint pain or swelling, back pain, and muscle pain. SKIN: Negative for lesions, rash PSYCH: Negative for sleep disturbance, mood disorder and recent psychosocial stressors. HEMATOLOGY Negative for prolonged bleeding, bruising easily, and swollen nodes. ENDOCRINE: Negative for cold or heat intolerance, polyuria, polydipsia and goiter. NEURO: negative for tremor, gait imbalance, syncope and seizures. The remainder of the review of systems is noncontributory.  Physical Exam: BP (!) 93/50   Pulse (!) 56   Temp 97.7 F (36.5 C) (Oral)   Resp 19   Ht 5\' 6"  (1.676 m)   Wt 87 kg   SpO2 92%   BMI 30.96 kg/m  GENERAL: The patient is AO x3, in no acute distress. HEENT: Head is normocephalic and atraumatic. EOMI are intact. Mouth is well hydrated and without lesions. NECK: Supple. No masses LUNGS: Clear to auscultation. No presence of rhonchi/wheezing/rales. Adequate chest expansion HEART: RRR, normal s1 and s2. ABDOMEN: Soft, nontender, no guarding, no peritoneal signs, and nondistended. BS +. No masses. EXTREMITIES: Without any cyanosis, clubbing, rash, lesions or edema. NEUROLOGIC: AOx3, no focal motor deficit. SKIN: no jaundice, no rashes  Laboratory Data CBC:     Component Value Date/Time   WBC 7.5 04/03/2023 0545   RBC 2.65 (L) 04/03/2023 0545   HGB 9.9 (L) 04/03/2023 0545   HGB 14.4 07/31/2021 1204   HCT 29.0 (L) 04/03/2023 0545   HCT 42.4 07/31/2021 1204   PLT 78 (L) 04/03/2023 0545   PLT 146 (L) 07/31/2021 1204   MCV 109.4 (H) 04/03/2023 0545   MCV 97 07/31/2021 1204   MCH 37.4 (H) 04/03/2023 0545  MCHC 34.1 04/03/2023 0545   RDW 13.2 04/03/2023 0545   RDW 11.8 07/31/2021 1204   LYMPHSABS 1.8 04/03/2023 0545   LYMPHSABS 1.6 07/31/2021 1204   MONOABS 0.8 04/03/2023 0545   EOSABS 0.5 04/03/2023  0545   EOSABS 0.2 07/31/2021 1204   BASOSABS 0.1 04/03/2023 0545   BASOSABS 0.1 07/31/2021 1204   COAG:  Lab Results  Component Value Date   INR 2.0 (H) 04/02/2023   INR 1.5 (H) 03/23/2023   INR 1.7 (H) 02/28/2023    BMP:     Latest Ref Rng & Units 04/03/2023    5:45 AM 04/02/2023    5:27 PM 03/23/2023    9:44 AM  BMP  Glucose 70 - 99 mg/dL 161  096  045   BUN 6 - 20 mg/dL 10  10  7    Creatinine 0.61 - 1.24 mg/dL 4.09  8.11  9.14   BUN/Creat Ratio 6 - 22 (calc)   SEE NOTE:   Sodium 135 - 145 mmol/L 132  130  134   Potassium 3.5 - 5.1 mmol/L 3.2  3.5  3.8   Chloride 98 - 111 mmol/L 94  92  97   CO2 22 - 32 mmol/L 29  28  29    Calcium 8.9 - 10.3 mg/dL 8.2  8.2  8.7     HEPATIC:     Latest Ref Rng & Units 04/03/2023    5:45 AM 04/02/2023    5:27 PM 03/23/2023    9:44 AM  Hepatic Function  Total Protein 6.5 - 8.1 g/dL 5.2  5.4  6.1   Albumin 3.5 - 5.0 g/dL 2.4  2.3    AST 15 - 41 U/L 58  66  62   ALT 0 - 44 U/L 22  28  29    Alk Phosphatase 38 - 126 U/L 126  133    Total Bilirubin 0.3 - 1.2 mg/dL 6.9  7.6  5.6     CARDIAC:  Lab Results  Component Value Date   CKTOTAL 185 03/06/2011   CKMB 4.1 (H) 03/06/2011   TROPONINI <0.30 04/20/2012     Imaging: I personally reviewed and interpreted the available imaging.  Assessment & Plan: This is a 57 y.o. male with history of alcoholic hepatitis, decompensated alcohol cirrhosis complicated by portal hypertension, ascites, esophageal varices status post multiple bleeds with subsequent EVL, hepatic encephalopathy presented with hypotension   #Hypotension  Likely iatrogenic due to Coreg and inability to tolerate nonselective beta-blocker as patient was both bradycardic and hypotensive.  Will hold Coreg and continue midodrine.  Repeat paracentesis to rule out SBP  #Decompensated Cirrhosis  MELD 3.0: 25 at 04/03/2023  5:45 AM MELD-Na: 24 at 04/03/2023  5:45 AM Calculated from: Serum Creatinine: 0.81 mg/dL (Using min of 1 mg/dL)  at 7/82/9562  1:30 AM Serum Sodium: 132 mmol/L at 04/03/2023  5:45 AM Total Bilirubin: 6.9 mg/dL at 8/65/7846  9:62 AM Serum Albumin: 2.4 g/dL at 9/52/8413  2:44 AM INR(ratio): 2.0 at 04/02/2023  5:27 PM Age at listing (hypothetical): 68 years Sex: Male at 04/03/2023  5:45 AM   #Eitology : ETOH  # Hepatic encephalopathy - Titrate lactulose to achieve 2-3 Bms per day  - Xifaxan 550 mg BID  - Avoid opiates or benzodiazepines  # Ascites - Low sodium diet - Diuretics Lasix 40 mg daily, spironolactone 100 mg daily.  Would hold beta-blockers and continue midodrine at this time Obtain repeat paracentesis to rule out SBP  # HCC screening CT  abdomen pelvis 02/23/2023 cirrhotic findings, no hepatoma.  Repeat due 08/2023 with AFP  Following with transplant hepatology in [place], not currently a candidate for transplant given ongoing alcohol use.   #EV Last upper endoscopy 02/22/2023 with grade 2 varices with stigmata of recent bleed status post banding Will repeat upper endoscopy every 4 to 6 weeks until complete eradication: Inpatient versus outpatient  Vista Lawman, MD Gastroenterology and Hepatology [facility]  This chart has been completed using [facility] Dictation software, and while attempts have been made to ensure accuracy , certain words and phrases may not be transcribed as intended

## 2023-04-03 NOTE — H&P (Signed)
History and Physical    Patient: Shawn Velazquez MWU:[id number] DOB: 07/31/1965 DOA: 04/02/2023 DOS: the patient was seen and examined on 04/03/2023 PCP: [name], MD  Patient coming from: Home  Chief Complaint:  Chief Complaint  Patient presents with   Hypotension   HPI: Shawn Velazquez is a 57 y.o. male with medical history significant of anxiety, depression, diabetes mellitus type 2, esophageal varices, hypertension, history of substance abuse, and more presents the ED with a chief complaint of hypotension.  Patient reports he was hypotensive because he checked his blood pressure.  Currently this was not a routine check, he was checking it because he has been dizzy, fatigue, and generalized weakness.  Patient does have a history of vertigo, but reports that this was different than his normal vertigo.  He did not have any chest pain, palpitations, shortness of breath.  He reports has had a good appetite and been taking plenty of p.o. fluids.  He did have some nausea and vomiting about a week ago.  He reports he has diarrhea every day.  He is not sure how many times a day.  He has no hematemesis, melena, hematochezia.  Patient does not know what medications he is on and says his sister handles all of that.  He thinks that it is not possible that he had an accidental overdose of beta-blocker.  Patient reports he has been eating well.  He also has had some peripheral edema.  Patient reports his edema has been pretty bad, and that he had some ascites as well.  Paracentesis was done earlier this week.  4 L were taken off.  Patient reports he has not smoked in a week.  He no longer drinks alcohol.  He is full code. Review of Systems: As mentioned in the history of present illness. All other systems reviewed and are negative. Past Medical History:  Diagnosis Date   Anxiety    Arthritis    affects hands, shoulder, neck, knees, hips, ankles, toes   Chronic back pain    Diabetes mellitus     diet controlled   Eczema    Esophageal varices (HCC)    Fatty liver, alcoholic    GERD (gastroesophageal reflux disease)    Glaucoma    slight case   Hypertension    Iron deficiency anemia due to chronic blood loss 11/25/2022   Psoriasis    Sleep apnea    Substance abuse (HCC)    Vertigo 09/11/2017   Wears glasses    Past Surgical History:  Procedure Laterality Date   COLONOSCOPY WITH PROPOFOL N/A 04/05/2022   Procedure: COLONOSCOPY WITH PROPOFOL;  Surgeon: Lanelle Bal, DO;  Location: [facility];  Service: Endoscopy;  Laterality: N/A;  10:45 AM,unable to reach pt to move up   ESOPHAGEAL BANDING N/A 08/26/2022   Procedure: ESOPHAGEAL BANDING;  Surgeon: Lanelle Bal, DO;  Location: [facility];  Service: Endoscopy;  Laterality: N/A;   ESOPHAGEAL BANDING  09/24/2022   Procedure: ESOPHAGEAL BANDING;  Surgeon: Corbin Ade, MD;  Location: [facility];  Service: Endoscopy;;   ESOPHAGEAL BANDING  02/22/2023   Procedure: ESOPHAGEAL BANDING;  Surgeon: Dolores Frame, MD;  [facility]: [facility];  Service: Gastroenterology;;   ESOPHAGOGASTRODUODENOSCOPY (EGD) WITH PROPOFOL N/A 08/26/2022   Procedure: ESOPHAGOGASTRODUODENOSCOPY (EGD) WITH PROPOFOL;  Surgeon: Lanelle Bal, DO;  [facility];  Service: Endoscopy;  Laterality: N/A;   ESOPHAGOGASTRODUODENOSCOPY (EGD) WITH PROPOFOL N/A 09/24/2022   Procedure:  ESOPHAGOGASTRODUODENOSCOPY (EGD) WITH PROPOFOL;  Surgeon: Corbin Ade, MD;  [facility]: [facility];  Service: Endoscopy;  Laterality: N/A;  hematemesis, esophageal varices, melena   ESOPHAGOGASTRODUODENOSCOPY (EGD) WITH PROPOFOL N/A 02/22/2023   Procedure: ESOPHAGOGASTRODUODENOSCOPY (EGD) WITH PROPOFOL;  Surgeon: Dolores Frame, MD;  [facility];  Service: Gastroenterology;  Laterality: N/A;   MULTIPLE TOOTH EXTRACTIONS     POLYPECTOMY  04/05/2022   Procedure: POLYPECTOMY;  Surgeon: Lanelle Bal, DO;  [facility];  Service: Endoscopy;;   POSTERIOR CERVICAL LAMINECTOMY  04/21/2012   Procedure: POSTERIOR CERVICAL LAMINECTOMY;  Surgeon: Mariam Dollar, MD;  Location: [facility];  Service: Neurosurgery;  Laterality: Left;  Posterior Cervical laminectomy/foraminotomy and diskectomy, left cervical seven-thoracic one   ROTATOR CUFF REPAIR     THORACIC DISCECTOMY Left 04/03/2014   Procedure: Left Thoracic Seven to Eight, Thoracic Eight to Nine Thoracic Discectomy ;  Surgeon: Mariam Dollar, MD;  Location: [facility];  Service: Neurosurgery;  Laterality: Left;   Social History:  reports that he has been smoking cigarettes. He started smoking about 40 years ago. He has a 10 pack-year smoking history. He has never used smokeless tobacco. He reports that he does not currently use alcohol after a past usage of about 12.0 standard drinks of alcohol per week. He reports that he does not use drugs.  Allergies  Allergen Reactions   Neosporin + Pain Relief Max St [Neomy-Bacit-Polymyx-Pramoxine] Rash    Family History  Problem Relation Age of Onset   Diabetes Father    Cancer - Lung Father    Hypertension Other    Cancer - Lung Other     Prior to Admission medications   Medication Sig Start Date End Date Taking? Authorizing Provider  albuterol (VENTOLIN HFA) 108 (90 Base) MCG/ACT inhaler Inhale 2 puffs into the lungs every 6 (six) hours as needed for wheezing or shortness of breath. 02/28/23   Hongalgi, Maximino Greenland, MD  busPIRone (BUSPAR) 5 MG tablet Take 1 tablet (5 mg total) by mouth daily as needed (anxiety). 02/28/23   Hongalgi, Maximino Greenland, MD  carvedilol (COREG) 3.125 MG tablet Take 1 tablet (3.125 mg total) by mouth 2 (two) times daily with a meal. 12/09/22 03/23/23  Emokpae, Courage, MD  Continuous Glucose Sensor (DEXCOM G7 SENSOR) MISC Inject 1 application  into the skin as directed. Change sensor every 10 days as directed. 12/23/22   Dani Gobble, NP  feeding supplement (ENSURE ENLIVE / ENSURE PLUS) LIQD Take  237 mLs by mouth 2 (two) times daily between meals. 02/28/23   Hongalgi, Maximino Greenland, MD  ferrous sulfate 324 MG TBEC Take 1 tablet (324 mg total) by mouth daily with breakfast. 12/08/22   Shon Hale, MD  FLUoxetine (PROZAC) 10 MG capsule Take 1 capsule (10 mg total) by mouth 2 (two) times daily. 02/28/23   Hongalgi, Maximino Greenland, MD  folic acid (FOLVITE) 1 MG tablet TAKE 1 TABLET BY MOUTH DAILY 01/10/23   Aida Raider, NP  furosemide (LASIX) 40 MG tablet Take 1 tablet (40 mg total) by mouth daily. 03/23/23 03/22/24  Lanelle Bal, DO  gabapentin (NEURONTIN) 100 MG capsule Take 1 tablet by mouth daily. 10/05/22   [provider]  glucose blood test strip Use as instructed to monitor glucose 4 times daily 12/09/21   Dani Gobble, NP  insulin glargine (LANTUS SOLOSTAR) 100 UNIT/ML Solostar Pen Inject 30 Units into the skin at bedtime. 12/23/22   Ronny Bacon  J, NP  insulin lispro (HUMALOG KWIKPEN) 100 UNIT/ML KwikPen Inject 8-14 Units into the skin 3 (three) times daily. 03/31/23   Dani Gobble, NP  Insulin Pen Needle (PEN NEEDLES) 32G X 4 MM MISC 1 each by Does not apply route in the morning, at noon, in the evening, and at bedtime. Use to inject insulin 4 times daily 05/12/22   Dani Gobble, NP  lactulose (CHRONULAC) 10 GM/15ML solution Take 30 mLs (20 g total) by mouth 2 (two) times daily. 02/28/23   Hongalgi, Maximino Greenland, MD  levocetirizine (XYZAL) 5 MG tablet Take 1 tablet by mouth daily. 10/05/22   [provider]  MAGNESIUM PO Take by mouth. daily    [provider]  midodrine (PROAMATINE) 5 MG tablet Take 1 tablet (5 mg total) by mouth 3 (three) times daily with meals. To help Prevent Low Blood Pressure Patient taking differently: Take 5 mg by mouth 2 (two) times daily with a meal. To help Prevent Low Blood Pressure 12/08/22   Shon Hale, MD  Multiple Vitamin (MULTIVITAMIN) tablet Take 1 tablet by mouth daily.    [provider]  ondansetron  (ZOFRAN-ODT) 4 MG disintegrating tablet Take 1 tablet (4 mg total) by mouth every 8 (eight) hours as needed for nausea or vomiting. 12/21/22   Aida Raider, NP  pantoprazole (PROTONIX) 40 MG tablet Take 1 tablet (40 mg total) by mouth daily. 02/28/23 04/29/23  Elease Etienne, MD  spironolactone (ALDACTONE) 50 MG tablet Take 2 tablets (100 mg total) by mouth daily. 03/23/23 03/22/24  Lanelle Bal, DO  thiamine (VITAMIN B-1) 100 MG tablet Take 1 tablet (100 mg total) by mouth daily. 12/08/22   Shon Hale, MD  XIFAXAN 550 MG TABS tablet TAKE 1 TABLET BY MOUTH TWICE DAILY 02/14/23 02/14/24  Aida Raider, NP    Physical Exam: Vitals:   04/03/23 0415 04/03/23 0445 04/03/23 0500 04/03/23 0530  BP: 91/60 (!) 90/59  (!) 86/55  Pulse: 60 (!) 57  (!) 55  Resp: 15 13  10   Temp:    97.8 F (36.6 C)  TempSrc:    Oral  SpO2: 92% 91% 93% 92%  Weight:      Height:       1.  General: Patient lying supine in bed, chronically ill-appearing, appears older than stated age   2. Psychiatric: Alert and oriented x 3, mood and behavior normal for situation, pleasant and cooperative with exam   3. Neurologic: Speech and language are normal, face is symmetric, moves all 4 extremities voluntarily, at baseline without acute deficits on limited exam   4. HEENMT:  Head is atraumatic, normocephalic, pupils reactive to light, sclera appear jaundiced, neck is supple, trachea is midline, mucous membranes are moist   5. Respiratory : Lungs are clear to auscultation bilaterally without wheezing, rhonchi, rales, no cyanosis, no increase in work of breathing or accessory muscle use   6. Cardiovascular : Heart rate normal, rhythm is regular, no murmurs, rubs or gallops, no peripheral edema, peripheral pulses palpated   7. Gastrointestinal:  Abdomen is soft, nondistended, nontender to palpation bowel sounds active, no masses or organomegaly palpated   8. Skin:  Skin is warm, dry and intact without  rashes, jaundice   9.Musculoskeletal:  No acute deformities or trauma, no asymmetry in tone, no peripheral edema, peripheral pulses palpated, no tenderness to palpation in the extremities  Data Reviewed: In the ED Temp 97.8, heart rate 51-56, respiratory rate 12-17, blood pressure  81/52-94/63, satting 92-96% on room air No leukocytosis with a white blood cell count of 10.8 Thrombocytopenia with a platelet count of 82 Hyperglycemia 275 with a pseudohyponatremia 130 that corrects to almost normal Elevated alk phos, decreased albumin, elevated T. bili BNP is elevated at 341 [address] 10, 10 Chest x-ray shows small left pleural effusion and left lung base atelectasis Patient was given midodrine and 500 mL bolus Admission requested due to hypotension  Assessment and Plan: * Hypotension - Soft blood pressures down to 80s over 50s - Reports normal appetite - Compliant with blood pressure medication but reports it is not possible that he accidentally overdosed on blood pressure medication - Patient is also bradycardic - Hold, Lasix, spironolactone - Troponin 10, 10 - Midodrine given in the ED - Continue PTA medication midodrine - Continue IV fluids - Continue to monitor  Alcoholic cirrhosis of liver with ascites (HCC) - Recently had paracentesis with 4 L off - Now with symptomatic hypotension - Albumin 12.5 g x 3 ordered - Holding spironolactone due to hypotension - Continue rifaximin  Elevated bilirubin - Bilirubin 7.6 - This is up from 5.6 - Patient appears jaundiced on exam - Consult GI for liver decompensation  Gastroesophageal reflux disease - Continue Protonix  Anxiety and depression - Continue BuSpar and Prozac  Hyperlipidemia - No statin at this time due to cirrhosis  Uncontrolled type 2 diabetes mellitus with hyperglycemia (HCC) - Takes 30 units of basal insulin at home - Continue reduced dose here - Sliding scale coverage - Monitor CBG      Advance Care  Planning:   Code Status: Full Code  Consults: GI  Family Communication: No family at bedside  Severity of Illness: The appropriate patient status for this patient is OBSERVATION. Observation status is judged to be reasonable and necessary in order to provide the required intensity of service to ensure the patient's safety. The patient's presenting symptoms, physical exam findings, and initial radiographic and laboratory data in the context of their medical condition is felt to place them at decreased risk for further clinical deterioration. Furthermore, it is anticipated that the patient will be medically stable for discharge from the hospital within 2 midnights of admission.   Author: Lilyan Gilford, DO 04/03/2023 5:44 AM  For on call review [email or web address].

## 2023-04-03 NOTE — Assessment & Plan Note (Signed)
-   Recently had paracentesis with 4 L off - Now with symptomatic hypotension - Albumin 12.5 g x 3 ordered - Holding spironolactone due to hypotension - Continue rifaximin

## 2023-04-03 NOTE — Assessment & Plan Note (Signed)
- 

## 2023-04-03 NOTE — Progress Notes (Signed)
PROGRESS NOTE  Shawn Velazquez KVQ:[id number] DOB: 1966/03/31   PCP: [name], MD  Patient is from: Home.  Lives with his girlfriend.  Independently ambulates at baseline.  DOA: 04/02/2023 LOS: 0  Chief complaints Chief Complaint  Patient presents with   Hypotension     Brief Narrative / Interim history: 57 year old M with PMH of EtOH cirrhosis with recurrent ascites and EV, DM-2, HTN, anxiety, depression, vertigo and chronic diarrhea (likely from lactulose) and tobacco use disorder presented to ED with dizziness, fatigue, weakness and low blood pressure, and admitted for hypotension.  He was also bradycardic to 50s.  He is on low-dose Coreg and home.  Unsure if he took more than prescribed.  He does not know what medications he takes.  He says his sister manages medications.  Patient had paracentesis with removal of 4 L on 9/18.  In ED, hypotensive to 85/63 and bradycardic to 53. Na 130.  Glucose 275.  Total bili 7.6.  BNP 341.  Troponin negative.  WBC 6.5.  Hgb 10.8 (above baseline).  Platelet 82 (chronic).  INR 2.0.  Patient was started on IV fluid and admitted.  GI consulted.    Subjective: Seen and examined earlier this morning.  No major events overnight of this morning.  Feels better this morning.  Has no complaints.  He is sleepy but wakes to voice.  Objective: Vitals:   04/03/23 0830 04/03/23 0845 04/03/23 0900 04/03/23 1134  BP: (!) 92/56  (!) 93/50   Pulse: 60  (!) 56   Resp: 13  19   Temp: 97.7 F (36.5 C) 97.7 F (36.5 C)  97.7 F (36.5 C)  TempSrc: Oral Oral  Axillary  SpO2: 97%  92%   Weight:   87 kg   Height:   5\' 6"  (1.676 m)     Examination:  GENERAL: No apparent distress.  Nontoxic. HEENT: MMM.  Vision and hearing grossly intact.  NECK: Supple.  No apparent JVD.  RESP:  No IWOB.  Fair aeration bilaterally. CVS:  RRR. Heart sounds normal.  ABD/GI/GU: BS+. Abd full but soft.  Nontender.  1+ BLE edema. MSK/EXT:  Moves extremities. No apparent  deformity. No edema.  SKIN: no apparent skin lesion or wound NEURO: Awake, alert and oriented appropriately.  No apparent focal neuro deficit. PSYCH: Calm. Normal affect.   Procedures:  None  Microbiology summarized: None  Assessment and plan: Principal Problem:   Hypotension Active Problems:   Alcoholic cirrhosis of liver with ascites (HCC)   Uncontrolled type 2 diabetes mellitus with hyperglycemia (HCC)   Hyperlipidemia   Anxiety and depression   Gastroesophageal reflux disease   Elevated bilirubin  Hypotension/sinus bradycardia: Likely iatrogenic in the setting of liver cirrhosis.  Seems to be on Coreg at home.  Not sure if he took more than prescribed.  Per patient, patient sister manages medications.  Received IV albumin in ED.  Blood pressure improved.  Remains bradycardic to 50s. -Continue holding Coreg -Discontinue IV fluid -Increase midodrine to 5 mg 3 times daily.  Was taking twice daily -Will resume home diuretics at reduced dose based on blood pressure throughout the day. -Follow GI recommendation  Alcoholic cirrhosis of liver with ascites/hyperbilirubinemia: Denies recent alcohol consumption.  Had paracentesis with removal of 4 L on 9/18.  Still with moderate ascites on ultrasound.  Low suspicion for SBP.  -IR paracentesis with fluid studies. -Continue home rifaximin -Resume home diuretics based on blood pressure -Follow GI recommendations  Uncontrolled DM-2 with hyperglycemia:  A1c 6.7% on 8/20. Recent Labs  Lab 04/02/23 1731 04/03/23 0818 04/03/23 1143  GLUCAP 267* 156* 261*  -Continue SSI-moderate -Start NovoLog 4 units 3 times daily with meals -Semglee 15 units daily instead of at bedtime -Further adjustment as appropriate   Gastroesophageal reflux disease -Continue Protonix   Anxiety and depression -Continue BuSpar and Prozac   Hyperlipidemia -No statin at this time due to cirrhosis  Generalized weakness/dizziness: Leg gradient  hypotension. -PT/OT  Hypokalemia/hypomagnesemia -Replenish and recheck as appropriate  Mild hyponatremia: Likely hypervolemic. -Discontinue IV fluid -Resume diuretics as appropriate   Obesity Body mass index is 30.96 kg/m.           DVT prophylaxis:  SCDs Start: 04/03/23 0045  Code Status: Full code Family Communication: None at bedside Level of care: Stepdown Status is: Observation The patient will require care spanning > 2 midnights and should be moved to inpatient because: Hypotension, bradycardia, electrolyte arrangement and liver cirrhosis with ascites   Final disposition: Home Consultants:  Gastroenterology  55 minutes with more than 50% spent in reviewing records, counseling patient/family and coordinating care.   Sch Meds:  Scheduled Meds:  Chlorhexidine Gluconate Cloth  6 each Topical Q0600   FLUoxetine  10 mg Oral BID   gabapentin  100 mg Oral QHS   insulin aspart  0-15 Units Subcutaneous TID WC   insulin aspart  0-5 Units Subcutaneous QHS   insulin glargine-yfgn  15 Units Subcutaneous QHS   lactulose  20 g Oral BID   midodrine  5 mg Oral TID WC   pantoprazole  40 mg Oral Daily   potassium chloride  40 mEq Oral Q4H   rifaximin  550 mg Oral BID   thiamine  100 mg Oral Daily   Continuous Infusions: PRN Meds:.acetaminophen **OR** acetaminophen, albuterol, busPIRone, morphine injection, ondansetron **OR** ondansetron (ZOFRAN) IV, oxyCODONE  Antimicrobials: Anti-infectives (From admission, onward)    Start     Dose/Rate Route Frequency Ordered Stop   04/03/23 0045  rifaximin (XIFAXAN) tablet 550 mg        550 mg Oral 2 times daily 04/03/23 0044          I have personally reviewed the following labs and images: CBC: Recent Labs  Lab 03/29/23 0910 04/02/23 1727 04/03/23 0545  WBC 8.5 6.5 7.5  NEUTROABS  --  4.1 4.4  HGB 11.4* 10.8* 9.9*  HCT 31.1* 29.2* 29.0*  MCV 105.4* 110.2* 109.4*  PLT 114* 82* 78*   BMP &GFR Recent Labs  Lab  04/02/23 1727 04/03/23 0545  NA 130* 132*  K 3.5 3.2*  CL 92* 94*  CO2 28 29  GLUCOSE 275* 191*  BUN 10 10  CREATININE 0.85 0.81  CALCIUM 8.2* 8.2*  MG  --  1.4*   Estimated Creatinine Clearance: 102.8 mL/min (by C-G formula based on SCr of 0.81 mg/dL). Liver & Pancreas: Recent Labs  Lab 04/02/23 1727 04/03/23 0545  AST 66* 58*  ALT 28 22  ALKPHOS 133* 126  BILITOT 7.6* 6.9*  PROT 5.4* 5.2*  ALBUMIN 2.3* 2.4*   No results for input(s): "LIPASE", "AMYLASE" in the last 168 hours. No results for input(s): "AMMONIA" in the last 168 hours. Diabetic: No results for input(s): "HGBA1C" in the last 72 hours. Recent Labs  Lab 04/02/23 1731 04/03/23 0818 04/03/23 1143  GLUCAP 267* 156* 261*   Cardiac Enzymes: No results for input(s): "CKTOTAL", "CKMB", "CKMBINDEX", "TROPONINI" in the last 168 hours. No results for input(s): "PROBNP" in the last 8760 hours.  Coagulation Profile: Recent Labs  Lab 04/02/23 1727  INR 2.0*   Thyroid Function Tests: No results for input(s): "TSH", "T4TOTAL", "FREET4", "T3FREE", "THYROIDAB" in the last 72 hours. Lipid Profile: No results for input(s): "CHOL", "HDL", "LDLCALC", "TRIG", "CHOLHDL", "LDLDIRECT" in the last 72 hours. Anemia Panel: No results for input(s): "VITAMINB12", "FOLATE", "FERRITIN", "TIBC", "IRON", "RETICCTPCT" in the last 72 hours. Urine analysis:    Component Value Date/Time   COLORURINE AMBER (A) 02/22/2023 1645   APPEARANCEUR CLEAR 02/22/2023 1645   LABSPEC 1.015 02/22/2023 1645   PHURINE 5.0 02/22/2023 1645   GLUCOSEU NEGATIVE 02/22/2023 1645   HGBUR NEGATIVE 02/22/2023 1645   BILIRUBINUR NEGATIVE 02/22/2023 1645   BILIRUBINUR negative 07/01/2020 1608   KETONESUR NEGATIVE 02/22/2023 1645   PROTEINUR NEGATIVE 02/22/2023 1645   UROBILINOGEN 0.2 07/01/2020 1608   NITRITE NEGATIVE 02/22/2023 1645   LEUKOCYTESUR NEGATIVE 02/22/2023 1645   Sepsis Labs: Invalid input(s): "PROCALCITONIN",  "LACTICIDVEN"  Microbiology: No results found for this or any previous visit (from the past 240 hour(s)).  Radiology Studies: [place] ASCITES (ABDOMEN LIMITED)  Result Date: 04/03/2023 CLINICAL DATA:  57 year old male with abdominal distension. Cirrhosis. Paracentesis on 03/23/2023. EXAM: LIMITED ABDOMEN ULTRASOUND FOR ASCITES TECHNIQUE: Limited ultrasound survey for ascites was performed in all four abdominal quadrants. COMPARISON:  Ultrasound-guided paracentesis 03/23/2023. CT Abdomen and Pelvis 02/23/2023. FINDINGS: Nodular, cirrhotic liver redemonstrated (image 2). Moderate volume of right upper quadrant ascites surrounding the liver. Similar moderate volume of ascites in the other 3 abdominal quadrants. This is progressed compared to the CT on 02/23/2023. Appearance is not as pronounced as on the preprocedural ultrasound images 03/23/2023. IMPRESSION: Moderate volume ascites, fluid present in all 4 quadrants, but appears somewhat less than was encountered on the 03/23/2023 paracentesis. Electronically Signed   By: Odessa Fleming M.D.   On: 04/03/2023 09:43   DG Chest Port 1 View  Result Date: 04/02/2023 CLINICAL DATA:  Edema. Low blood pressure. Generalized weakness and dizziness. EXAM: PORTABLE CHEST 1 VIEW COMPARISON:  Chest radiograph dated 02/26/2023. FINDINGS: 6 small left pleural effusion and left lung base atelectasis or infiltrate. There is background of chronic interstitial coarsening. No pneumothorax. Stable cardiac silhouette. No acute osseous pathology. IMPRESSION: Small left pleural effusion and left lung base atelectasis or infiltrate. Electronically Signed   By: Elgie Collard M.D.   On: 04/02/2023 18:22      Jasmyn Picha T. Acelyn Basham [facility]  If 7PM-7AM, please contact night-coverage [email or web address] 04/03/2023, 12:09 PM

## 2023-04-03 NOTE — Plan of Care (Signed)

## 2023-04-03 NOTE — Evaluation (Signed)
Physical Therapy Evaluation Patient Details Name: Shawn Velazquez MRN: [id number] DOB: [birth year] Today's Date: 04/03/2023  History of Present Illness  Shawn Velazquez is a 57 y.o. male with medical history significant of anxiety, depression, diabetes mellitus type 2, esophageal varices, hypertension, history of substance abuse, and more presents the ED with a chief complaint of hypotension.  Patient reports he was hypotensive because he checked his blood pressure.  Currently this was not a routine check, he was checking it because he has been dizzy, fatigue, and generalized weakness.  Patient does have a history of vertigo, but reports that this was different than his normal vertigo.  He did not have any chest pain, palpitations, shortness of breath.  He reports has had a good appetite and been taking plenty of p.o. fluids.  He did have some nausea and vomiting about a week ago.  He reports he has diarrhea every day.  He is not sure how many times a day.  He has no hematemesis, melena, hematochezia.  Patient does not know what medications he is on and says his sister handles all of that.  He thinks that it is not possible that he had an accidental overdose of beta-blocker.  Patient reports he has been eating well.  He also has had some peripheral edema.  Patient reports his edema has been pretty bad, and that he had some ascites as well.  Paracentesis was done earlier this week.  4 L were taken off.   Clinical Impression  Patient functioning near baseline for functional mobility and gait demonstrating good return for ambulating in room, hallways without loss of balance.  Plan:  Patient discharged from physical therapy to care of nursing for ambulation daily as tolerated for length of stay.          If plan is discharge home, recommend the following: Other (comment) (near baseline)   Can travel by private vehicle        Equipment Recommendations None recommended by PT  Recommendations for  Other Services       Functional Status Assessment Patient has not had a recent decline in their functional status     Precautions / Restrictions Precautions Precautions: None Restrictions Weight Bearing Restrictions: No      Mobility  Bed Mobility Overal bed mobility: Independent                  Transfers Overall transfer level: Independent                      Ambulation/Gait Ambulation/Gait assistance: Modified independent (Device/Increase time) Gait Distance (Feet): 120 Feet Assistive device: None Gait Pattern/deviations: WFL(Within Functional Limits) Gait velocity: near normal     General Gait Details: grossly WFL with good return for ambulating in room, hallways without loss of balance  Stairs            Wheelchair Mobility     Tilt Bed    Modified Rankin (Stroke Patients Only)       Balance Overall balance assessment: No apparent balance deficits (not formally assessed)                                           Pertinent Vitals/Pain Pain Assessment Pain Assessment: No/denies pain    Home Living Family/patient expects to be discharged to:: Private residence Living Arrangements: Spouse/significant other Available Help at  Discharge: Family;Friend(s);Available 24 hours/day Type of Home: House Home Access: Level entry       Home Layout: One level Home Equipment: Agricultural consultant (2 wheels);Cane - single point;Grab bars - tub/shower;Shower seat      Prior Function Prior Level of Function : Independent/Modified Independent             Mobility Comments: Community ambulator without AD, does not drive ADLs Comments: Independent     Extremity/Trunk Assessment   Upper Extremity Assessment Upper Extremity Assessment: Defer to OT evaluation    Lower Extremity Assessment Lower Extremity Assessment: Overall WFL for tasks assessed    Cervical / Trunk Assessment Cervical / Trunk Assessment: Normal   Communication   Communication Communication: No apparent difficulties  Cognition Arousal: Alert Behavior During Therapy: WFL for tasks assessed/performed Overall Cognitive Status: Within Functional Limits for tasks assessed                                          General Comments      Exercises     Assessment/Plan    PT Assessment Patient does not need any further PT services  PT Problem List         PT Treatment Interventions      PT Goals (Current goals can be found in the Care Plan section)  Acute Rehab PT Goals Patient Stated Goal: return home PT Goal Formulation: With patient Time For Goal Achievement: 04/03/23 Potential to Achieve Goals: Good    Frequency       Co-evaluation               AM-PAC PT "6 Clicks" Mobility  Outcome Measure Help needed turning from your back to your side while in a flat bed without using bedrails?: None Help needed moving from lying on your back to sitting on the side of a flat bed without using bedrails?: None Help needed moving to and from a bed to a chair (including a wheelchair)?: None Help needed standing up from a chair using your arms (e.g., wheelchair or bedside chair)?: None Help needed to walk in hospital room?: None Help needed climbing 3-5 steps with a railing? : None 6 Click Score: 24    End of Session   Activity Tolerance: Patient tolerated treatment well Patient left: in bed;with call bell/phone within reach Nurse Communication: Mobility status PT Visit Diagnosis: Unsteadiness on feet (R26.81);Other abnormalities of gait and mobility (R26.89);Muscle weakness (generalized) (M62.81)    Time: 1610-9604 PT Time Calculation (min) (ACUTE ONLY): 20 min   Charges:   PT Evaluation $PT Eval Moderate Complexity: 1 Mod PT Treatments $Therapeutic Activity: 8-22 mins PT General Charges $$ ACUTE PT VISIT: 1 Visit         11:39 AM, 04/03/23 Ocie Bob, MPT Physical Therapist with  [facility] 336 [phone] office [phone] mobile phone

## 2023-04-03 NOTE — Progress Notes (Signed)
   04/03/23 1156  TOC Brief Assessment  Insurance and Status Reviewed  Patient has primary care physician Yes  Home environment has been reviewed From Home  Prior level of function: Independent  Prior/Current Home Services No current home services  Social Determinants of Health Reivew SDOH reviewed no interventions necessary  Readmission risk has been reviewed Yes  Transition of care needs no transition of care needs at this time     Transition of Care Department [facility]) has reviewed patient and no TOC needs have been identified at this time. We will continue to monitor patient advancement through interdisciplinary progression rounds. If new patient transition needs arise, please place a TOC consult.

## 2023-04-03 NOTE — ED Notes (Signed)
Pt placed on 2L Nasal [place]. Pts O2 Sats improved from 88% to 95% after receiving supplemental oxygen. Hospitalist notified.

## 2023-04-03 NOTE — Progress Notes (Signed)
Came in to assess patient.  Patient placed on 2L [place], with current sat of 99%.  BS were clear.  Patient stated he feels that the oxygen has helped and does not feel the need to have breathing treatment at this time.  I related to patient that MD has put in order for Albuterol Q4 PRN when he feels he needs this.  Patient seems to be stable at this time, no increased WOB, no wheeze.

## 2023-04-03 NOTE — Assessment & Plan Note (Signed)
-   No statin at this time due to cirrhosis

## 2023-04-04 ENCOUNTER — Encounter (HOSPITAL_COMMUNITY): Payer: Self-pay | Admitting: Family Medicine

## 2023-04-04 ENCOUNTER — Observation Stay (HOSPITAL_COMMUNITY): Payer: BC Managed Care – PPO

## 2023-04-04 DIAGNOSIS — Z6832 Body mass index (BMI) 32.0-32.9, adult: Secondary | ICD-10-CM | POA: Diagnosis not present

## 2023-04-04 DIAGNOSIS — R188 Other ascites: Secondary | ICD-10-CM | POA: Diagnosis not present

## 2023-04-04 DIAGNOSIS — K7682 Hepatic encephalopathy: Secondary | ICD-10-CM | POA: Diagnosis not present

## 2023-04-04 DIAGNOSIS — F17210 Nicotine dependence, cigarettes, uncomplicated: Secondary | ICD-10-CM | POA: Diagnosis not present

## 2023-04-04 DIAGNOSIS — E871 Hypo-osmolality and hyponatremia: Secondary | ICD-10-CM | POA: Diagnosis not present

## 2023-04-04 DIAGNOSIS — E876 Hypokalemia: Secondary | ICD-10-CM | POA: Diagnosis not present

## 2023-04-04 DIAGNOSIS — E1165 Type 2 diabetes mellitus with hyperglycemia: Secondary | ICD-10-CM | POA: Diagnosis not present

## 2023-04-04 DIAGNOSIS — I959 Hypotension, unspecified: Secondary | ICD-10-CM | POA: Diagnosis not present

## 2023-04-04 DIAGNOSIS — I10 Essential (primary) hypertension: Secondary | ICD-10-CM | POA: Diagnosis not present

## 2023-04-04 DIAGNOSIS — Z79899 Other long term (current) drug therapy: Secondary | ICD-10-CM | POA: Diagnosis not present

## 2023-04-04 DIAGNOSIS — K7031 Alcoholic cirrhosis of liver with ascites: Secondary | ICD-10-CM | POA: Diagnosis not present

## 2023-04-04 DIAGNOSIS — I952 Hypotension due to drugs: Secondary | ICD-10-CM | POA: Diagnosis not present

## 2023-04-04 DIAGNOSIS — E669 Obesity, unspecified: Secondary | ICD-10-CM | POA: Diagnosis not present

## 2023-04-04 DIAGNOSIS — E785 Hyperlipidemia, unspecified: Secondary | ICD-10-CM | POA: Diagnosis not present

## 2023-04-04 DIAGNOSIS — R0602 Shortness of breath: Secondary | ICD-10-CM | POA: Diagnosis not present

## 2023-04-04 DIAGNOSIS — Z794 Long term (current) use of insulin: Secondary | ICD-10-CM | POA: Diagnosis not present

## 2023-04-04 LAB — COMPREHENSIVE METABOLIC PANEL
ALT: 23 U/L (ref 0–44)
AST: 56 U/L — ABNORMAL HIGH (ref 15–41)
Albumin: 2.6 g/dL — ABNORMAL LOW (ref 3.5–5.0)
Alkaline Phosphatase: 126 U/L (ref 38–126)
Anion gap: 9 (ref 5–15)
BUN: 9 mg/dL (ref 6–20)
CO2: 26 mmol/L (ref 22–32)
Calcium: 8.2 mg/dL — ABNORMAL LOW (ref 8.9–10.3)
Chloride: 95 mmol/L — ABNORMAL LOW (ref 98–111)
Creatinine, Ser: 0.74 mg/dL (ref 0.61–1.24)
GFR, Estimated: >60 mL/min (ref >60)
Glucose, Bld: 219 mg/dL — ABNORMAL HIGH (ref 70–99)
Potassium: 3.6 mmol/L (ref 3.5–5.1)
Sodium: 130 mmol/L — ABNORMAL LOW (ref 135–145)
Total Bilirubin: 4.8 mg/dL — ABNORMAL HIGH (ref 0.3–1.2)
Total Protein: 5.5 g/dL — ABNORMAL LOW (ref 6.5–8.1)

## 2023-04-04 LAB — GRAM STAIN: Gram Stain: NONE SEEN

## 2023-04-04 LAB — CBC
HCT: 32.9 % — ABNORMAL LOW (ref 39.0–52.0)
Hemoglobin: 10.7 g/dL — ABNORMAL LOW (ref 13.0–17.0)
MCH: 34.6 pg — ABNORMAL HIGH (ref 26.0–34.0)
MCHC: 32.5 g/dL (ref 30.0–36.0)
MCV: 106.5 fL — ABNORMAL HIGH (ref 80.0–100.0)
Platelets: 74 10*3/uL — ABNORMAL LOW (ref 150–400)
RBC: 3.09 MIL/uL — ABNORMAL LOW (ref 4.22–5.81)
RDW: 13.2 % (ref 11.5–15.5)
WBC: 7.1 10*3/uL (ref 4.0–10.5)
nRBC: 0 % (ref 0.0–0.2)

## 2023-04-04 LAB — BODY FLUID CELL COUNT WITH DIFFERENTIAL
Eos, Fluid: 0 %
Lymphs, Fluid: 44 %
Monocyte-Macrophage-Serous Fluid: 48 % — ABNORMAL LOW (ref 50–90)
Neutrophil Count, Fluid: 8 % (ref 0–25)
Total Nucleated Cell Count, Fluid: 133 mm3 (ref 0–1000)

## 2023-04-04 LAB — MAGNESIUM: Magnesium: 1.6 mg/dL — ABNORMAL LOW (ref 1.7–2.4)

## 2023-04-04 LAB — PROTIME-INR
INR: 2 — ABNORMAL HIGH (ref 0.8–1.2)
Prothrombin Time: 22.5 s — ABNORMAL HIGH (ref 11.4–15.2)

## 2023-04-04 LAB — GLUCOSE, CAPILLARY
Glucose-Capillary: 181 mg/dL — ABNORMAL HIGH (ref 70–99)
Glucose-Capillary: 270 mg/dL — ABNORMAL HIGH (ref 70–99)
Glucose-Capillary: 192 mg/dL — ABNORMAL HIGH (ref 70–99)

## 2023-04-04 LAB — ALBUMIN, PLEURAL OR PERITONEAL FLUID: Albumin, Fluid: <1.5 g/dL

## 2023-04-04 LAB — MRSA NEXT GEN BY PCR, NASAL: MRSA by PCR Next Gen: NOT DETECTED

## 2023-04-04 MED ORDER — LIDOCAINE HCL (PF) 2 % IJ SOLN
INTRAMUSCULAR | Status: AC
  Filled 2023-04-04: qty 20

## 2023-04-04 MED ORDER — POTASSIUM CHLORIDE CRYS ER 20 MEQ PO TBCR
20.0000 meq | EXTENDED_RELEASE_TABLET | Freq: Once | ORAL | Status: AC
  Administered 2023-04-04: 20 meq via ORAL
  Filled 2023-04-04: qty 1

## 2023-04-04 MED ORDER — MAGNESIUM SULFATE 2 GM/50ML IV SOLN
2.0000 g | Freq: Once | INTRAVENOUS | Status: AC
  Administered 2023-04-04: 2 g via INTRAVENOUS
  Filled 2023-04-04: qty 50

## 2023-04-04 MED ORDER — LIDOCAINE HCL (PF) 2 % IJ SOLN
10.0000 mL | Freq: Once | INTRAMUSCULAR | Status: AC
  Administered 2023-04-04: 10 mL
  Filled 2023-04-04: qty 10

## 2023-04-04 NOTE — Progress Notes (Signed)
Gastroenterology Progress Note    Primary Care Physician:  [name], MD Primary Gastroenterologist:  Dr. Marletta Lor  Patient ID: Shawn Velazquez; [id number]; [birth year]    Subjective   No abdominal pain, N/V. States he follows a low salt diet. No mental status changes or confusion. Feels tired today. Awaiting para. He denies alcohol use to me but endorses few cans of beer weekly yesterday during consultation    Objective   Vital signs in last 24 hours Temp:  [97.7 F (36.5 C)-98.9 F (37.2 C)] 98.2 F (36.8 C) (09/30 0757) Pulse Rate:  [52-69] 68 (09/30 0900) Resp:  [12-20] 18 (09/30 0900) BP: (94-113)/(47-66) 99/60 (09/30 0900) SpO2:  [92 %-98 %] 95 % (09/30 0900) Weight:  [89.2 kg] 89.2 kg (09/30 0432) Last BM Date : 04/04/23 (Pt stated had BM this morning)  Physical Exam General:   Alert and oriented, pleasant Head:  Normocephalic and atraumatic. Abdomen:  Bowel sounds present, distended with moderately tense ascites, protruding umbilical hernia Msk:  Symmetrical without gross deformities. Normal posture. Extremities:  With 1+ edema and pedal edema Neurologic:  Alert and  oriented x4;  no asterixis Psych:  Alert and cooperative. Normal mood and affect.  Intake/Output from previous day: 09/29 0701 - 09/30 0700 In: 46.7 [IV Piggyback:46.7] Out: 250 [Urine:250] Intake/Output this shift: Total I/O In: 240 [P.O.:240] Out: -   Lab Results  Recent Labs    04/02/23 1727 04/03/23 0545 04/04/23 0418  WBC 6.5 7.5 7.1  HGB 10.8* 9.9* 10.7*  HCT 29.2* 29.0* 32.9*  PLT 82* 78* 74*   BMET Recent Labs    04/02/23 1727 04/03/23 0545 04/04/23 0418  NA 130* 132* 130*  K 3.5 3.2* 3.6  CL 92* 94* 95*  CO2 28 29 26   GLUCOSE 275* 191* 219*  BUN 10 10 9   CREATININE 0.85 0.81 0.74  CALCIUM 8.2* 8.2* 8.2*   LFT Recent Labs    04/02/23 1727 04/03/23 0545 04/04/23 0418  PROT 5.4* 5.2* 5.5*  ALBUMIN 2.3* 2.4* 2.6*  AST 66* 58* 56*  ALT 28 22 23   ALKPHOS  133* 126 126  BILITOT 7.6* 6.9* 4.8*   PT/INR Recent Labs    04/02/23 1727 04/04/23 0418  LABPROT 22.4* 22.5*  INR 2.0* 2.0*     Studies/Results [place] ASCITES (ABDOMEN LIMITED)  Result Date: 04/03/2023 CLINICAL DATA:  57 year old male with abdominal distension. Cirrhosis. Paracentesis on 03/23/2023. EXAM: LIMITED ABDOMEN ULTRASOUND FOR ASCITES TECHNIQUE: Limited ultrasound survey for ascites was performed in all four abdominal quadrants. COMPARISON:  Ultrasound-guided paracentesis 03/23/2023. CT Abdomen and Pelvis 02/23/2023. FINDINGS: Nodular, cirrhotic liver redemonstrated (image 2). Moderate volume of right upper quadrant ascites surrounding the liver. Similar moderate volume of ascites in the other 3 abdominal quadrants. This is progressed compared to the CT on 02/23/2023. Appearance is not as pronounced as on the preprocedural ultrasound images 03/23/2023. IMPRESSION: Moderate volume ascites, fluid present in all 4 quadrants, but appears somewhat less than was encountered on the 03/23/2023 paracentesis. Electronically Signed   By: Odessa Fleming M.D.   On: 04/03/2023 09:43   DG Chest Port 1 View  Result Date: 04/02/2023 CLINICAL DATA:  Edema. Low blood pressure. Generalized weakness and dizziness. EXAM: PORTABLE CHEST 1 VIEW COMPARISON:  Chest radiograph dated 02/26/2023. FINDINGS: 6 small left pleural effusion and left lung base atelectasis or infiltrate. There is background of chronic interstitial coarsening. No pneumothorax. Stable cardiac silhouette. No acute osseous pathology. IMPRESSION: Small left pleural effusion and left lung base atelectasis  or infiltrate. Electronically Signed   By: Elgie Collard M.D.   On: 04/02/2023 18:22   US Paracentesis  Result Date: 03/23/2023 INDICATION: Patient with history of ETOH cirrhosis, recurrent ascites. Request for diagnostic and therapeutic paracentesis. EXAM: ULTRASOUND GUIDED DIAGNOSTIC AND THERAPEUTIC PARACENTESIS MEDICATIONS: 7 mL 1% lidocaine  COMPLICATIONS: None immediate. PROCEDURE: Informed written consent was obtained from the patient after a discussion of the risks, benefits and alternatives to treatment. A timeout was performed prior to the initiation of the procedure. Initial ultrasound scanning demonstrates a large amount of ascites within the left lower abdominal quadrant. The left lower abdomen was prepped and draped in the usual sterile fashion. 1% lidocaine was used for local anesthesia. Following this, a 19 gauge, 7-cm, Yueh catheter was introduced. An ultrasound image was saved for documentation purposes. The paracentesis was performed. The catheter was removed and a dressing was applied. The patient tolerated the procedure well without immediate post procedural complication. Patient received post-procedure intravenous albumin; see nursing notes for details. FINDINGS: A total of approximately 4.0 L of clear yellow fluid was removed. Samples were sent to the laboratory as requested by the clinical team. IMPRESSION: Successful ultrasound-guided paracentesis yielding 4.0 liters of peritoneal fluid. Performed by Lynnette Caffey, PA-C PLAN: The patient has previously been formally evaluated by the [facility] and is being actively followed for potential future intervention. Electronically Signed   By: Acquanetta Belling M.D.   On: 03/23/2023 16:18    Assessment  57 y.o. male with a history of  alcoholic hepatitis, decompensated alcohol cirrhosis complicated by portal hypertension, ascites, esophageal varices status post multiple bleeds with subsequent EVL, hepatic encephalopathy,  presenting this admission with hypotension in setting of non-selective beta blocker.   Cirrhosis: MELD 3.0 today is 24. INR remains elevated at 2.0, similar to 2 days prior. Seems to be near his baseline. He is without encephalopathy on exam, continuing on lactulose and Xifaxan. Para today to r/o SBP. Notably, no PVT  on CTA BRTO in Aug 2024.   History of alcoholic hepatitis: DF elevated at 48 today with PT control of 13. However, he has had elevated DF during prior presentations and prednisolone has not been started due to poor compliance with therapy historically.   Hypotension: recent BP 107/63. Continue to hold carvedilol. Continue midodrine. Outpatient diuretic therapy of Lasix 40 mg daily and spironolactone 100 mg daily on hold until hypotension improves.    Plan / Recommendations  [place] para today Continue Lactulose and Xifaxan with goal of 2-3 BMs per day Low sodium diet Holding lasix 40 mg and spironolactone 100 mg daily until hypotension improves and would start back at half dosing and titrate as tolerated.  Continue to hold carvedilol Will need outpatient EGD for repeat banding as last Aug 2024, banding to eradication Feb 2025 [place] due, AFP tumor marker at that time    LOS: 0 days    04/04/2023, 10:30 AM  Gelene Mink, PhD, ANP-BC [facility]

## 2023-04-04 NOTE — Progress Notes (Signed)
Patient tolerated right sided paracentesis procedure well today and 3.9 Liters of yellow ascites removed with labs collected and sent for processing. Patient verbalized understanding of post procedure instructions and transported back to inpatient bed assignment at this time with no acute distress noted.

## 2023-04-04 NOTE — Progress Notes (Signed)
[facility] PROGRESS NOTE  Shawn Velazquez (DOB: 26-Sep-1965) ZOX:[id number] PCP: [name], MD Outpatient Specialists: [facility]  Brief Narrative: Shawn Velazquez is a 57 y.o. male with a history of EtOH cirrhosis with recurrent ascites, esophageal varices, HTN, tobacco use, depression/anxiety who presented to the ED on 04/02/2023 with fatigue, weakness, found to be significantly hypotensive and bradycardic. Home coreg was held, midodrine increased, and diuretics held. Paracentesis is planned 9/30 (last para 9/18 4L).   Subjective: Feels ok, no abd pain, fever, vomiting. Feet hurt which is his baseline due to neuropathy.   Objective: BP (!) 108/58   Pulse 64   Temp 97.8 F (36.6 C)   Resp 15   Ht 5\' 6"  (1.676 m)   Wt 90.2 kg   SpO2 92%   BMI 32.10 kg/m   Gen: No distress Pulm: Clear, nonlabored  CV: Regular, 1+ LE edema GI: Distended, nontender, +BS.  Neuro: Alert and oriented. No new focal deficits. Ext: Warm, no deformities. Skin: No rashes, lesions or ulcers on visualized skin   Assessment & Plan: Hypotension/sinus bradycardia:  - Holding beta blocker, would ideally be able to restart this and diuretics at some point given the severity of his decompensated cirrhosis with both EVs and recurrent ascites.  - Continue augmented midodrine.    Alcoholic cirrhosis of liver with ascites, EVs, coagulopathy: Denies recent alcohol consumption.   - Had paracentesis with removal of 4 L on 9/18.  Still with moderate ascites on ultrasound.  Low suspicion for SBP, though need to r/o with hypotension and abd distention. Para ordered today.  - Continue home rifaximin, mentation normal.  - Appreciate GI recommendations.  - INR 2, platelets 74, urge to get OOB often to reduce VTE risk. With para pending, aim to minimize bleeding risk.    Uncontrolled DM-2 with hyperglycemia: A1c 6.7% on 8/20. - Continue mod SSI, novolog 4u TIDWC, and glargine 15u.   GERD:  - Continue  PPI   Anxiety and depression -Continue BuSpar and Prozac   Hyperlipidemia -No statin at this time due to cirrhosis   Generalized weakness/dizziness:   - PT/OT   Hypokalemia/hypomagnesemia -Replenish and recheck as appropriate   Mild hyponatremia: Likely hypervolemic. - Holding diuretics, plan to restart at half dose once able as above.    Obesity: Body mass index is 32.1 kg/m.   Tyrone Nine, MD [facility] [email or web address] 04/04/2023, 1:08 PM

## 2023-04-04 NOTE — Progress Notes (Signed)
OT Cancellation Note  Patient Details Name: Shawn Velazquez MRN: [id number] DOB: 03-15-1966   Cancelled Treatment:    Reason Eval/Treat Not Completed: OT screened, no needs identified, will sign off. Pt performing ADLs and functional mobility independently. No OT needs at this time.     Ezra Sites, OTR/L  [phone] 04/04/2023, 7:29 AM

## 2023-04-05 ENCOUNTER — Telehealth: Payer: Self-pay | Admitting: Gastroenterology

## 2023-04-05 DIAGNOSIS — E1165 Type 2 diabetes mellitus with hyperglycemia: Secondary | ICD-10-CM | POA: Diagnosis not present

## 2023-04-05 DIAGNOSIS — E871 Hypo-osmolality and hyponatremia: Secondary | ICD-10-CM | POA: Diagnosis not present

## 2023-04-05 DIAGNOSIS — R0602 Shortness of breath: Secondary | ICD-10-CM | POA: Diagnosis not present

## 2023-04-05 DIAGNOSIS — I952 Hypotension due to drugs: Secondary | ICD-10-CM | POA: Diagnosis not present

## 2023-04-05 DIAGNOSIS — Z79899 Other long term (current) drug therapy: Secondary | ICD-10-CM | POA: Diagnosis not present

## 2023-04-05 DIAGNOSIS — K7031 Alcoholic cirrhosis of liver with ascites: Secondary | ICD-10-CM | POA: Diagnosis not present

## 2023-04-05 DIAGNOSIS — E876 Hypokalemia: Secondary | ICD-10-CM | POA: Diagnosis not present

## 2023-04-05 DIAGNOSIS — E785 Hyperlipidemia, unspecified: Secondary | ICD-10-CM | POA: Diagnosis not present

## 2023-04-05 DIAGNOSIS — K7460 Unspecified cirrhosis of liver: Secondary | ICD-10-CM | POA: Diagnosis not present

## 2023-04-05 DIAGNOSIS — Z794 Long term (current) use of insulin: Secondary | ICD-10-CM | POA: Diagnosis not present

## 2023-04-05 DIAGNOSIS — I10 Essential (primary) hypertension: Secondary | ICD-10-CM | POA: Diagnosis not present

## 2023-04-05 DIAGNOSIS — F17210 Nicotine dependence, cigarettes, uncomplicated: Secondary | ICD-10-CM | POA: Diagnosis not present

## 2023-04-05 DIAGNOSIS — E669 Obesity, unspecified: Secondary | ICD-10-CM | POA: Diagnosis not present

## 2023-04-05 DIAGNOSIS — Z6832 Body mass index (BMI) 32.0-32.9, adult: Secondary | ICD-10-CM | POA: Diagnosis not present

## 2023-04-05 DIAGNOSIS — I959 Hypotension, unspecified: Secondary | ICD-10-CM | POA: Diagnosis not present

## 2023-04-05 DIAGNOSIS — K7682 Hepatic encephalopathy: Secondary | ICD-10-CM | POA: Diagnosis not present

## 2023-04-05 LAB — COMPREHENSIVE METABOLIC PANEL
ALT: 23 U/L (ref 0–44)
AST: 61 U/L — ABNORMAL HIGH (ref 15–41)
Albumin: 2.6 g/dL — ABNORMAL LOW (ref 3.5–5.0)
Alkaline Phosphatase: 139 U/L — ABNORMAL HIGH (ref 38–126)
Anion gap: 7 (ref 5–15)
BUN: 7 mg/dL (ref 6–20)
CO2: 28 mmol/L (ref 22–32)
Calcium: 8.2 mg/dL — ABNORMAL LOW (ref 8.9–10.3)
Chloride: 95 mmol/L — ABNORMAL LOW (ref 98–111)
Creatinine, Ser: 0.69 mg/dL (ref 0.61–1.24)
GFR, Estimated: >60 mL/min (ref >60)
Glucose, Bld: 176 mg/dL — ABNORMAL HIGH (ref 70–99)
Potassium: 4.3 mmol/L (ref 3.5–5.1)
Sodium: 130 mmol/L — ABNORMAL LOW (ref 135–145)
Total Bilirubin: 4.4 mg/dL — ABNORMAL HIGH (ref 0.3–1.2)
Total Protein: 5.5 g/dL — ABNORMAL LOW (ref 6.5–8.1)

## 2023-04-05 LAB — GLUCOSE, CAPILLARY
Glucose-Capillary: 173 mg/dL — ABNORMAL HIGH (ref 70–99)
Glucose-Capillary: 140 mg/dL — ABNORMAL HIGH (ref 70–99)
Glucose-Capillary: 221 mg/dL — ABNORMAL HIGH (ref 70–99)
Glucose-Capillary: 216 mg/dL — ABNORMAL HIGH (ref 70–99)

## 2023-04-05 LAB — MAGNESIUM: Magnesium: 1.8 mg/dL (ref 1.7–2.4)

## 2023-04-05 MED ORDER — FUROSEMIDE 40 MG PO TABS: 20.0000 mg | ORAL_TABLET | Freq: Every day | ORAL | 0 refills | Status: DC

## 2023-04-05 MED ORDER — SPIRONOLACTONE 50 MG PO TABS: 50.0000 mg | ORAL_TABLET | Freq: Every day | ORAL | 0 refills | Status: DC

## 2023-04-05 MED ORDER — INFLUENZA VIRUS VACC SPLIT PF (FLUZONE) 0.5 ML IM SUSY: 0.5000 mL | PREFILLED_SYRINGE | INTRAMUSCULAR | Status: DC

## 2023-04-05 NOTE — Discharge Summary (Signed)
Physician Discharge Summary   Patient: Shawn Velazquez MRN: [id number] DOB: 1965/10/01  Admit date:     04/02/2023  Discharge date: 04/05/23  Discharge Physician: Tyrone Nine   PCP: [name], MD   Recommendations at discharge:   Follow up with PCP in next 1-2 weeks with repeat CMP, CBC, and vital sign check.  Follow up with [facility], will need outpatient EGD.   Discharge Diagnoses: Principal Problem:   Hypotension Active Problems:   Alcoholic cirrhosis of liver with ascites (HCC)   Uncontrolled type 2 diabetes mellitus with hyperglycemia (HCC)   Hyperlipidemia   Anxiety and depression   Gastroesophageal reflux disease   Elevated bilirubin  Hospital Course: TOYA KUCZEK is a 57 y.o. male with a history of EtOH cirrhosis with recurrent ascites, esophageal varices, HTN, tobacco use, depression/anxiety who presented to the ED on 04/02/2023 with fatigue, weakness, found to be significantly hypotensive and bradycardic. Home coreg was held, midodrine increased, and diuretics held. Paracentesis was repeated 9/30 (last para 9/18 4L), obtaining 3.9L of transudative fluid with improved symptoms. Blood pressure has normalized and HR is now also normal. The patient has been cleared by the GI and medical teams for discharge on 10/1 with plans to take half dose of diuretics and maintain close follow up after discharge.   Assessment and Plan: Hypotension/sinus bradycardia:  - Holding beta blocker at discharge. BP is better even after return to 5mg  TID dosing which we will continue at discharge.    Alcoholic cirrhosis of liver with ascites, EVs, coagulopathy: Denies recent alcohol consumption.   - Had paracentesis with removal of 4 L on 9/18, repeated 3.9L on 9/30 (transudate). Will follow final culture results, though negative gram stain and does not clinically have evidence of SBP.  - Continue home rifaximin, lactulose.  - Appreciate GI recommendations. Will arrange EGD in ~6 weeks  and sooner office visit follow up. Per our discussion, will restart lasix 20mg  and spironolactone 50mg  at discharge.  - INR 2, platelets 74.    Uncontrolled DM-2 with hyperglycemia: A1c 6.7% on 8/20. Continue home insulin Tx and PCP follow up.   GERD:  - Continue PPI   Anxiety and depression -Continue BuSpar and Prozac   Hyperlipidemia -No statin at this time due to cirrhosis   Generalized weakness/dizziness:   - PT/OT   Hypokalemia/hypomagnesemia: Resolved with supplementation. Restart spiro.   Mild hyponatremia: Likely hypervolemic. - Held diuretics, plan to restart at half dose     Obesity: Body mass index is 32.1 kg/m.    Consultants: GI Procedures performed: Paracentesis 9/30  Disposition: Home Diet recommendation: 2g sodium restriction DISCHARGE MEDICATION: Allergies as of 04/05/2023       Reactions   Neosporin + Pain Relief Max St [neomy-bacit-polymyx-pramoxine] Rash        Medication List     STOP taking these medications    carvedilol 3.125 MG tablet Commonly known as: COREG       TAKE these medications    albuterol 108 (90 Base) MCG/ACT inhaler Commonly known as: VENTOLIN HFA Inhale 2 puffs into the lungs every 6 (six) hours as needed for wheezing or shortness of breath.   Breztri Aerosphere 160-9-4.8 MCG/ACT Aero Generic drug: Budeson-Glycopyrrol-Formoterol Inhale 2 puffs into the lungs 2 (two) times daily.   busPIRone 5 MG tablet Commonly known as: BUSPAR Take 1 tablet (5 mg total) by mouth daily as needed (anxiety).   CENTRUM ADULT PO Take 1 tablet by mouth daily.   Dexcom  G7 Sensor Misc Inject 1 application  into the skin as directed. Change sensor every 10 days as directed.   feeding supplement Liqd Take 237 mLs by mouth 2 (two) times daily between meals.   FLUoxetine 10 MG capsule Commonly known as: PROZAC Take 1 capsule (10 mg total) by mouth 2 (two) times daily. What changed:  how much to take when to take this additional  instructions   folic acid 1 MG tablet Commonly known as: FOLVITE TAKE 1 TABLET BY MOUTH DAILY   furosemide 40 MG tablet Commonly known as: Lasix Take 0.5 tablets (20 mg total) by mouth daily. What changed: how much to take   gabapentin 100 MG capsule Commonly known as: NEURONTIN Take 100 mg by mouth daily.   glucose blood test strip Use as instructed to monitor glucose 4 times daily   insulin lispro 100 UNIT/ML KwikPen Commonly known as: HumaLOG KwikPen Inject 8-14 Units into the skin 3 (three) times daily. What changed:  how much to take when to take this   lactulose 10 GM/15ML solution Commonly known as: CHRONULAC Take 30 mLs (20 g total) by mouth 2 (two) times daily. What changed: how much to take   Lantus SoloStar 100 UNIT/ML Solostar Pen Generic drug: insulin glargine Inject 30 Units into the skin at bedtime.   levocetirizine 5 MG tablet Commonly known as: XYZAL Take 1 tablet by mouth daily.   MAGNESIUM PO Take 1 tablet by mouth daily. Magnesium Taurate   midodrine 5 MG tablet Commonly known as: PROAMATINE Take 1 tablet (5 mg total) by mouth 3 (three) times daily with meals. To help Prevent Low Blood Pressure What changed: when to take this   ondansetron 8 MG disintegrating tablet Commonly known as: ZOFRAN-ODT Take 8 mg by mouth every 8 (eight) hours as needed for nausea or vomiting.   pantoprazole 40 MG tablet Commonly known as: Protonix Take 1 tablet (40 mg total) by mouth daily.   Pen Needles 32G X 4 MM Misc 1 each by Does not apply route in the morning, at noon, in the evening, and at bedtime. Use to inject insulin 4 times daily   spironolactone 50 MG tablet Commonly known as: ALDACTONE Take 1 tablet (50 mg total) by mouth daily. What changed: how much to take   VITAMIN B-1 PO Take 1 tablet by mouth daily.   Xifaxan 550 MG Tabs tablet Generic drug: rifaximin TAKE 1 TABLET BY MOUTH TWICE DAILY        Discharge Exam: Filed Weights    04/03/23 0900 04/04/23 0432 04/04/23 1100  Weight: 87 kg 89.2 kg 90.2 kg  BP (!) 91/56 (BP Location: Right Arm)   Pulse 64   Temp 98.2 F (36.8 C) (Oral)   Resp 17   Ht 5\' 6"  (1.676 m)   Wt 90.2 kg   SpO2 94%   BMI 32.10 kg/m   Nontoxic, alert and oriented 56yo M in no distress requesting discharge home Clear, nonlabored Trace pitting edema, soft abdomen with no tenderness, normoactive bowel sounds.   Condition at discharge: stable  The results of significant diagnostics from this hospitalization (including imaging, microbiology, ancillary and laboratory) are listed below for reference.   Imaging Studies: US Paracentesis  Result Date: 04/04/2023 INDICATION: 57 year old male with a history of ascites EXAM: ULTRASOUND GUIDED  PARACENTESIS MEDICATIONS: None. COMPLICATIONS: None PROCEDURE: Informed written consent was obtained from the patient after a discussion of the risks, benefits and alternatives to treatment. A timeout was performed prior to  the initiation of the procedure. Initial ultrasound scanning demonstrates a moderate amount of ascites within the right lower abdominal quadrant. The right lower abdomen was prepped and draped in the usual sterile fashion. 1% lidocaine was used for local anesthesia. Following this, a 8 Fr Safe-T-Centesis catheter was introduced. An ultrasound image was saved for documentation purposes. The paracentesis was performed. The catheter was removed and a dressing was applied. The patient tolerated the procedure well without immediate post procedural complication. FINDINGS: A total of approximately 3.9 L of thin yellow fluid was removed. Samples were sent to the laboratory as requested by the clinical team. IMPRESSION: Status post ultrasound-guided paracentesis Signed, Yvone Neu. Miachel Roux, RPVI [facility] Radiology Electronically Signed   By: Gilmer Mor D.O.   On: 04/04/2023 14:40   [place] ASCITES (ABDOMEN  LIMITED)  Result Date: 04/03/2023 CLINICAL DATA:  57 year old male with abdominal distension. Cirrhosis. Paracentesis on 03/23/2023. EXAM: LIMITED ABDOMEN ULTRASOUND FOR ASCITES TECHNIQUE: Limited ultrasound survey for ascites was performed in all four abdominal quadrants. COMPARISON:  Ultrasound-guided paracentesis 03/23/2023. CT Abdomen and Pelvis 02/23/2023. FINDINGS: Nodular, cirrhotic liver redemonstrated (image 2). Moderate volume of right upper quadrant ascites surrounding the liver. Similar moderate volume of ascites in the other 3 abdominal quadrants. This is progressed compared to the CT on 02/23/2023. Appearance is not as pronounced as on the preprocedural ultrasound images 03/23/2023. IMPRESSION: Moderate volume ascites, fluid present in all 4 quadrants, but appears somewhat less than was encountered on the 03/23/2023 paracentesis. Electronically Signed   By: Odessa Fleming M.D.   On: 04/03/2023 09:43   DG Chest Port 1 View  Result Date: 04/02/2023 CLINICAL DATA:  Edema. Low blood pressure. Generalized weakness and dizziness. EXAM: PORTABLE CHEST 1 VIEW COMPARISON:  Chest radiograph dated 02/26/2023. FINDINGS: 6 small left pleural effusion and left lung base atelectasis or infiltrate. There is background of chronic interstitial coarsening. No pneumothorax. Stable cardiac silhouette. No acute osseous pathology. IMPRESSION: Small left pleural effusion and left lung base atelectasis or infiltrate. Electronically Signed   By: Elgie Collard M.D.   On: 04/02/2023 18:22   US Paracentesis  Result Date: 03/23/2023 INDICATION: Patient with history of ETOH cirrhosis, recurrent ascites. Request for diagnostic and therapeutic paracentesis. EXAM: ULTRASOUND GUIDED DIAGNOSTIC AND THERAPEUTIC PARACENTESIS MEDICATIONS: 7 mL 1% lidocaine COMPLICATIONS: None immediate. PROCEDURE: Informed written consent was obtained from the patient after a discussion of the risks, benefits and alternatives to treatment. A timeout  was performed prior to the initiation of the procedure. Initial ultrasound scanning demonstrates a large amount of ascites within the left lower abdominal quadrant. The left lower abdomen was prepped and draped in the usual sterile fashion. 1% lidocaine was used for local anesthesia. Following this, a 19 gauge, 7-cm, Yueh catheter was introduced. An ultrasound image was saved for documentation purposes. The paracentesis was performed. The catheter was removed and a dressing was applied. The patient tolerated the procedure well without immediate post procedural complication. Patient received post-procedure intravenous albumin; see nursing notes for details. FINDINGS: A total of approximately 4.0 L of clear yellow fluid was removed. Samples were sent to the laboratory as requested by the clinical team. IMPRESSION: Successful ultrasound-guided paracentesis yielding 4.0 liters of peritoneal fluid. Performed by Lynnette Caffey, PA-C PLAN: The patient has previously been formally evaluated by the [facility] and is being actively followed for potential future intervention. Electronically Signed   By: Acquanetta Belling M.D.   On: 03/23/2023 16:18  Microbiology: Results for orders placed or performed during the hospital encounter of 04/02/23  Culture, body fluid w Gram Stain-bottle     Status: None (Preliminary result)   Collection Time: 04/04/23  2:00 PM   Specimen: Ascitic  Result Value Ref Range Status   Specimen Description ASCITIC  Final   Special Requests 10CC BOTTLES DRAWN AEROBIC AND ANAEROBIC  Final   Culture   Final    NO GROWTH < 24 HOURS Performed at [facility], [address], [place], [place] [address]    Report Status PENDING  Incomplete  Gram stain     Status: None   Collection Time: 04/04/23  2:00 PM   Specimen: Ascitic  Result Value Ref Range Status   Specimen Description ASCITIC  Final   Special Requests NONE  Final   Gram Stain    Final    NO ORGANISMS SEEN CYTOSPIN SMEAR WBC PRESENT, PREDOMINANTLY MONONUCLEAR Performed at [facility], [address], [place], [place] [address]    Report Status 04/04/2023 FINAL  Final  MRSA Next Gen by PCR, Nasal     Status: None   Collection Time: 04/04/23  7:00 PM   Specimen: Nasal Mucosa; Nasal Swab  Result Value Ref Range Status   MRSA by PCR Next Gen NOT DETECTED NOT DETECTED Final    Comment: (NOTE) The GeneXpert MRSA Assay (FDA approved for NASAL specimens only), is one component of a comprehensive MRSA colonization surveillance program. It is not intended to diagnose MRSA infection nor to guide or monitor treatment for MRSA infections. Test performance is not FDA approved in patients less than 34 years old. Performed at [facility], [address] [place], [place] [address]     Labs: CBC: Recent Labs  Lab 04/02/23 1727 04/03/23 0545 04/04/23 0418  WBC 6.5 7.5 7.1  NEUTROABS 4.1 4.4  --   HGB 10.8* 9.9* 10.7*  HCT 29.2* 29.0* 32.9*  MCV 110.2* 109.4* 106.5*  PLT 82* 78* 74*   Basic Metabolic Panel: Recent Labs  Lab 04/02/23 1727 04/03/23 0545 04/04/23 0418 04/05/23 0434  NA 130* 132* 130* 130*  K 3.5 3.2* 3.6 4.3  CL 92* 94* 95* 95*  CO2 28 29 26 28   GLUCOSE 275* 191* 219* 176*  BUN 10 10 9 7   CREATININE 0.85 0.81 0.74 0.69  CALCIUM 8.2* 8.2* 8.2* 8.2*  MG  --  1.4* 1.6* 1.8   Liver Function Tests: Recent Labs  Lab 04/02/23 1727 04/03/23 0545 04/04/23 0418 04/05/23 0434  AST 66* 58* 56* 61*  ALT 28 22 23 23   ALKPHOS 133* 126 126 139*  BILITOT 7.6* 6.9* 4.8* 4.4*  PROT 5.4* 5.2* 5.5* 5.5*  ALBUMIN 2.3* 2.4* 2.6* 2.6*   CBG: Recent Labs  Lab 04/04/23 1705 04/04/23 2055 04/05/23 0731 04/05/23 1121 04/05/23 1149  GLUCAP 173* 192* 140* 221* 216*    Discharge time spent: greater than 30 minutes.  Signed: Tyrone Nine, MD [facility] 04/05/2023

## 2023-04-05 NOTE — Telephone Encounter (Signed)
Shawn Velazquez, patient likely to be discharged today. Please reach out to schedule EGD as per Dr. Queen Blossom prior orders.

## 2023-04-05 NOTE — Progress Notes (Signed)
Gastroenterology Progress Note   Referring Provider: No ref. provider found Primary Care Physician:  [name], MD Patient ID: Shawn Velazquez; [id number]; 1966-04-17   Subjective:    No complaints. No abdominal pain or vomiting. Thinks he may go home today. No issues with lightheadedness with standing. Having 2-3 stools daily.  Objective:   Vital signs in last 24 hours: Temp:  [97.8 F (36.6 C)-98.7 F (37.1 C)] 98.2 F (36.8 C) (10/01 0402) Pulse Rate:  [61-68] 64 (10/01 0402) Resp:  [15-19] 17 (10/01 0402) BP: (91-116)/(47-69) 91/56 (10/01 0402) SpO2:  [91 %-98 %] 94 % (10/01 0402) Weight:  [90.2 kg] 90.2 kg (09/30 1100) Last BM Date : 04/04/23 General:   Alert,  Well-developed, well-nourished, pleasant and cooperative in NAD Head:  Normocephalic and atraumatic. Eyes:  Sclera clear, no icterus.  Chest: CTA bilaterally without rales, rhonchi, crackles.    Heart:  Regular rate and rhythm; no murmurs, clicks, rubs,  or gallops. Abdomen:  Soft, nontender and nondistended. No masses, hepatosplenomegaly or hernias noted. Normal bowel sounds, without guarding, and without rebound.   Extremities:  1+ pitting edema.  Neurologic:  Alert and  oriented x4;  grossly normal neurologically. Skin:  Intact without significant lesions or rashes. Psych:  Alert and cooperative. Normal mood and affect.  Intake/Output from previous day: 09/30 0701 - 10/01 0700 In: 380 [P.O.:330; IV Piggyback:50] Out: 400 [Urine:400] Intake/Output this shift: No intake/output data recorded.  Lab Results: CBC Recent Labs    04/02/23 1727 04/03/23 0545 04/04/23 0418  WBC 6.5 7.5 7.1  HGB 10.8* 9.9* 10.7*  HCT 29.2* 29.0* 32.9*  MCV 110.2* 109.4* 106.5*  PLT 82* 78* 74*   BMET Recent Labs    04/03/23 0545 04/04/23 0418 04/05/23 0434  NA 132* 130* 130*  K 3.2* 3.6 4.3  CL 94* 95* 95*  CO2 29 26 28   GLUCOSE 191* 219* 176*  BUN 10 9 7   CREATININE 0.81 0.74 0.69  CALCIUM 8.2* 8.2* 8.2*    LFTs Recent Labs    04/03/23 0545 04/04/23 0418 04/05/23 0434  BILITOT 6.9* 4.8* 4.4*  ALKPHOS 126 126 139*  AST 58* 56* 61*  ALT 22 23 23   PROT 5.2* 5.5* 5.5*  ALBUMIN 2.4* 2.6* 2.6*   No results for input(s): "LIPASE" in the last 72 hours. PT/INR Recent Labs    04/02/23 1727 04/04/23 0418  LABPROT 22.4* 22.5*  INR 2.0* 2.0*         Imaging Studies: US Paracentesis  Result Date: 04/04/2023 INDICATION: 57 year old male with a history of ascites EXAM: ULTRASOUND GUIDED  PARACENTESIS MEDICATIONS: None. COMPLICATIONS: None PROCEDURE: Informed written consent was obtained from the patient after a discussion of the risks, benefits and alternatives to treatment. A timeout was performed prior to the initiation of the procedure. Initial ultrasound scanning demonstrates a moderate amount of ascites within the right lower abdominal quadrant. The right lower abdomen was prepped and draped in the usual sterile fashion. 1% lidocaine was used for local anesthesia. Following this, a 8 Fr Safe-T-Centesis catheter was introduced. An ultrasound image was saved for documentation purposes. The paracentesis was performed. The catheter was removed and a dressing was applied. The patient tolerated the procedure well without immediate post procedural complication. FINDINGS: A total of approximately 3.9 L of thin yellow fluid was removed. Samples were sent to the laboratory as requested by the clinical team. IMPRESSION: Status post ultrasound-guided paracentesis Signed, Yvone Neu. Miachel Roux, RPVI [facility]  [facility] Radiology Electronically Signed   By: Gilmer Mor D.O.   On: 04/04/2023 14:40   [place] ASCITES (ABDOMEN LIMITED)  Result Date: 04/03/2023 CLINICAL DATA:  57 year old male with abdominal distension. Cirrhosis. Paracentesis on 03/23/2023. EXAM: LIMITED ABDOMEN ULTRASOUND FOR ASCITES TECHNIQUE: Limited ultrasound survey for ascites was performed in all  four abdominal quadrants. COMPARISON:  Ultrasound-guided paracentesis 03/23/2023. CT Abdomen and Pelvis 02/23/2023. FINDINGS: Nodular, cirrhotic liver redemonstrated (image 2). Moderate volume of right upper quadrant ascites surrounding the liver. Similar moderate volume of ascites in the other 3 abdominal quadrants. This is progressed compared to the CT on 02/23/2023. Appearance is not as pronounced as on the preprocedural ultrasound images 03/23/2023. IMPRESSION: Moderate volume ascites, fluid present in all 4 quadrants, but appears somewhat less than was encountered on the 03/23/2023 paracentesis. Electronically Signed   By: Odessa Fleming M.D.   On: 04/03/2023 09:43   DG Chest Port 1 View  Result Date: 04/02/2023 CLINICAL DATA:  Edema. Low blood pressure. Generalized weakness and dizziness. EXAM: PORTABLE CHEST 1 VIEW COMPARISON:  Chest radiograph dated 02/26/2023. FINDINGS: 6 small left pleural effusion and left lung base atelectasis or infiltrate. There is background of chronic interstitial coarsening. No pneumothorax. Stable cardiac silhouette. No acute osseous pathology. IMPRESSION: Small left pleural effusion and left lung base atelectasis or infiltrate. Electronically Signed   By: Elgie Collard M.D.   On: 04/02/2023 18:22   US Paracentesis  Result Date: 03/23/2023 INDICATION: Patient with history of ETOH cirrhosis, recurrent ascites. Request for diagnostic and therapeutic paracentesis. EXAM: ULTRASOUND GUIDED DIAGNOSTIC AND THERAPEUTIC PARACENTESIS MEDICATIONS: 7 mL 1% lidocaine COMPLICATIONS: None immediate. PROCEDURE: Informed written consent was obtained from the patient after a discussion of the risks, benefits and alternatives to treatment. A timeout was performed prior to the initiation of the procedure. Initial ultrasound scanning demonstrates a large amount of ascites within the left lower abdominal quadrant. The left lower abdomen was prepped and draped in the usual sterile fashion. 1%  lidocaine was used for local anesthesia. Following this, a 19 gauge, 7-cm, Yueh catheter was introduced. An ultrasound image was saved for documentation purposes. The paracentesis was performed. The catheter was removed and a dressing was applied. The patient tolerated the procedure well without immediate post procedural complication. Patient received post-procedure intravenous albumin; see nursing notes for details. FINDINGS: A total of approximately 4.0 L of clear yellow fluid was removed. Samples were sent to the laboratory as requested by the clinical team. IMPRESSION: Successful ultrasound-guided paracentesis yielding 4.0 liters of peritoneal fluid. Performed by Lynnette Caffey, PA-C PLAN: The patient has previously been formally evaluated by the [facility] Interventional Radiology Portal Hypertension Clinic and is being actively followed for potential future intervention. Electronically Signed   By: Acquanetta Belling M.D.   On: 03/23/2023 16:18  [2 weeks]  Assessment:   57 y.o. male with a history of  alcoholic hepatitis, decompensated alcohol cirrhosis complicated by portal hypertension, ascites, esophageal varices status post multiple bleeds with subsequent EVL, hepatic encephalopathy,  presenting this admission with hypotension in setting of non-selective beta blocker.    Cirrhosis: MELD 3.0 yesterday is 24. INR remains elevated at 2.0, similar to 2 days prior. He is without encephalopathy on exam, continuing on lactulose and Xifaxan. Para yesterday to r/o SBP. Notably, no PVT on CTA BRTO in Aug 2024.    History of alcoholic hepatitis: chronically AST/ALT ratio most c/w etoh hepatitis but could in part be due to his cirrhosis. DF elevated at 48 yesterday with PT control of  13. However, he has had elevated DF during prior presentations and prednisolone has not been started due to poor compliance with therapy historically.    Hypotension: hypotensive with systolic of 85 on presentation, bradycardiac in  50s. Patient with dizziness and fatigue on carvediolol 3.125mg  BID, lasix 40mg  daily, spironolactone 100mg  daily, midodrine 5mg  BID. BPs improved with holding carvedilol, diuretics.        Plan:   Two gram sodium restricted diet.  Continue midodrine TID. Consider restarting lasix and spironolactone at 1/2 dose with close monitoring of his BP at home. We can titrate up as BP allows.  Continue lactulose to achieve 2-3 BMs daily. Continue Xifaxan 550mg  BID.  Outpatient EGD every 4-6 weeks until complete eradication of esophageal varices.  Continue to hold carvedilol.  Return ov as scheduled.   LOS: 0 days   Leanna Battles. [facility] [phone] 10/1/20248:31 AM

## 2023-04-05 NOTE — Plan of Care (Signed)
  Problem: Clinical Measurements: Goal: Diagnostic test results will improve Outcome: Progressing Goal: Cardiovascular complication will be avoided Outcome: Progressing   Problem: Coping: Goal: Level of anxiety will decrease Outcome: Progressing

## 2023-04-06 LAB — PATHOLOGIST SMEAR REVIEW

## 2023-04-07 ENCOUNTER — Encounter: Payer: Self-pay | Admitting: *Deleted

## 2023-04-07 NOTE — Telephone Encounter (Signed)
Pt has been scheduled for 05/23/23. Instructions mailed.

## 2023-04-09 LAB — CULTURE, BODY FLUID W GRAM STAIN -BOTTLE: Culture: NO GROWTH

## 2023-04-19 ENCOUNTER — Ambulatory Visit: Payer: Self-pay | Admitting: *Deleted

## 2023-04-19 DIAGNOSIS — F5104 Psychophysiologic insomnia: Secondary | ICD-10-CM | POA: Diagnosis not present

## 2023-04-19 DIAGNOSIS — K7031 Alcoholic cirrhosis of liver with ascites: Secondary | ICD-10-CM | POA: Diagnosis not present

## 2023-04-19 DIAGNOSIS — I959 Hypotension, unspecified: Secondary | ICD-10-CM | POA: Diagnosis not present

## 2023-04-19 DIAGNOSIS — E876 Hypokalemia: Secondary | ICD-10-CM | POA: Diagnosis not present

## 2023-04-19 DIAGNOSIS — Z23 Encounter for immunization: Secondary | ICD-10-CM | POA: Diagnosis not present

## 2023-04-19 NOTE — Patient Outreach (Signed)
Care Coordination   Follow Up Visit Note   04/19/2023 Name: Shawn Velazquez MRN: [id number] DOB: [birth year]  Shawn Velazquez is a 57 y.o. year old male who sees Margo Aye, Kathleene Hazel, MD for primary care. I spoke with  Delsa Bern by phone today.  What matters to the patients health and wellness today?  Hypotension, diabetes, diarrhea with lactose   125/80 at pcp office & checks at home + when he gets dizzy and reports he is not generally dehydrated but nausea  Diabetes HgA1c 6.5 managing well at home   cirrhosis -lactulose causing multiple episodes of diarrhea.   He states he will try the BRAT diet discussed   Goals Addressed             This Visit's Progress    attend hospital follow up visits, attend substance abuse center, manage diabetes (RN care coordination services)   On track    Patient will manage his diarrhea at home with assist from BRAT diet Patient will check his blood pressure prior to taking hypertension medicine    Interventions Today    Flowsheet Row Most Recent Value  Chronic Disease   Chronic disease during today's visit Diabetes, Other  [hypotension, cirrhosis, BRAT diet]  General Interventions   General Interventions Discussed/Reviewed General Interventions Reviewed, Sick Day Rules, Doctor Visits  Doctor Visits Discussed/Reviewed Doctor Visits Reviewed, PCP  PCP/Specialist Visits Compliance with follow-up visit  Exercise Interventions   Exercise Discussed/Reviewed Exercise Discussed, Physical Activity  Physical Activity Discussed/Reviewed Physical Activity Discussed, Home Exercise Program (HEP)  Education Interventions   Education Provided Provided Web-based Education, Provided Education  [hypotension, foods to eat to help diarrhea, BRAT diet (Banana , rice, applesauce, Toast)]  Provided Verbal Education On Blood Sugar Monitoring, Exercise, Medication, Sick Day Rules  Mental Health Interventions   Mental Health Discussed/Reviewed Mental Health  Reviewed, Coping Strategies  Nutrition Interventions   Nutrition Discussed/Reviewed Nutrition Reviewed, Increasing proteins, Fluid intake  Pharmacy Interventions   Pharmacy Dicussed/Reviewed Pharmacy Topics Reviewed, Medications and their functions, Affording Medications  Safety Interventions   Safety Discussed/Reviewed Safety Discussed, Home Safety  Home Safety Assistive Devices              SDOH assessments and interventions completed:  No     Care Coordination Interventions:  Yes, provided   Follow up plan: Follow up call scheduled for 06/03/23    Encounter Outcome:  Patient Visit Completed    Cala Bradford L. Noelle Penner, RN, BSN, [facility]  [facility] Care Management Coordinator  [phone]  Fax: [phone]

## 2023-04-19 NOTE — Patient Instructions (Addendum)
Visit Information  Thank you for taking time to visit with me today. Please don't hesitate to contact me if I can be of assistance to you.   Following are the goals we discussed today:   Goals Addressed             This Visit's Progress    attend hospital follow up visits, attend substance abuse center, manage diabetes (RN care coordination services)   On track    Patient will manage his diarrhea at home with assist from BRAT diet Patient will check his blood pressure prior to taking hypertension medicine    Interventions Today    Flowsheet Row Most Recent Value  Chronic Disease   Chronic disease during today's visit Diabetes, Other  [hypotension, cirrhosis, BRAT diet]  General Interventions   General Interventions Discussed/Reviewed General Interventions Reviewed, Sick Day Rules, Doctor Visits  Doctor Visits Discussed/Reviewed Doctor Visits Reviewed, PCP  PCP/Specialist Visits Compliance with follow-up visit  Exercise Interventions   Exercise Discussed/Reviewed Exercise Discussed, Physical Activity  Physical Activity Discussed/Reviewed Physical Activity Discussed, Home Exercise Program (HEP)  Education Interventions   Education Provided Provided Web-based Education, Provided Education  [hypotension, foods to eat to help diarrhea, BRAT diet (Banana , rice, applesauce, Toast)]  Provided Verbal Education On Blood Sugar Monitoring, Exercise, Medication, Sick Day Rules  Mental Health Interventions   Mental Health Discussed/Reviewed Mental Health Reviewed, Coping Strategies  Nutrition Interventions   Nutrition Discussed/Reviewed Nutrition Reviewed, Increasing proteins, Fluid intake  Pharmacy Interventions   Pharmacy Dicussed/Reviewed Pharmacy Topics Reviewed, Medications and their functions, Affording Medications  Safety Interventions   Safety Discussed/Reviewed Safety Discussed, Home Safety  Home Safety Assistive Devices              Our next appointment is by telephone  on 06/03/23 at 3 pm  Please call the care guide team at [phone] if you need to cancel or reschedule your appointment.   If you are experiencing a Mental Health or Behavioral Health Crisis or need someone to talk to, please call the Suicide and Crisis Lifeline: 988 call the [place] National Suicide Prevention Lifeline: 681 524 6931 or TTY: 781-217-7429 TTY (351)887-2336) to talk to a trained counselor call 1-800-273-TALK (toll free, 24 hour hotline) call the [facility]: [phone] call 911   Patient verbalizes understanding of instructions and care plan provided today and agrees to view in MyChart. Active MyChart status and patient understanding of how to access instructions and care plan via MyChart confirmed with patient.     The patient has been provided with contact information for the care management team and has been advised to call with any health related questions or concerns.    Cora Stetson L. Noelle Penner, RN, BSN, [facility]  VBC[facility] Care Management Coordinator  [phone]  Fax: [phone]

## 2023-04-20 ENCOUNTER — Ambulatory Visit: Payer: BC Managed Care – PPO | Admitting: Gastroenterology

## 2023-04-20 ENCOUNTER — Other Ambulatory Visit: Payer: Self-pay | Admitting: *Deleted

## 2023-04-20 ENCOUNTER — Encounter: Payer: Self-pay | Admitting: Gastroenterology

## 2023-04-20 VITALS — BP 135/75 | HR 97 | Temp 98.4°F | Ht 66.0 in | Wt 192.4 lb

## 2023-04-20 DIAGNOSIS — D696 Thrombocytopenia, unspecified: Secondary | ICD-10-CM

## 2023-04-20 DIAGNOSIS — F1010 Alcohol abuse, uncomplicated: Secondary | ICD-10-CM

## 2023-04-20 DIAGNOSIS — F1020 Alcohol dependence, uncomplicated: Secondary | ICD-10-CM

## 2023-04-20 DIAGNOSIS — K766 Portal hypertension: Secondary | ICD-10-CM

## 2023-04-20 DIAGNOSIS — I8500 Esophageal varices without bleeding: Secondary | ICD-10-CM

## 2023-04-20 DIAGNOSIS — F17210 Nicotine dependence, cigarettes, uncomplicated: Secondary | ICD-10-CM

## 2023-04-20 DIAGNOSIS — I8501 Esophageal varices with bleeding: Secondary | ICD-10-CM

## 2023-04-20 DIAGNOSIS — K7031 Alcoholic cirrhosis of liver with ascites: Secondary | ICD-10-CM

## 2023-04-20 DIAGNOSIS — K3189 Other diseases of stomach and duodenum: Secondary | ICD-10-CM | POA: Diagnosis not present

## 2023-04-20 DIAGNOSIS — K219 Gastro-esophageal reflux disease without esophagitis: Secondary | ICD-10-CM

## 2023-04-20 DIAGNOSIS — D509 Iron deficiency anemia, unspecified: Secondary | ICD-10-CM | POA: Diagnosis not present

## 2023-04-20 DIAGNOSIS — K921 Melena: Secondary | ICD-10-CM

## 2023-04-20 DIAGNOSIS — D500 Iron deficiency anemia secondary to blood loss (chronic): Secondary | ICD-10-CM

## 2023-04-20 DIAGNOSIS — I959 Hypotension, unspecified: Secondary | ICD-10-CM

## 2023-04-20 NOTE — Patient Instructions (Addendum)
Cirrhosis Lifestyle Recommendations:  High-protein diet from a primarily plant-based diet. Avoid red meat.  No raw or undercooked meat, seafood, or shellfish. Low-fat/cholesterol/carbohydrate diet. Limit sodium to no more than 2000 mg/day including everything that you eat and drink. Recommend at least 30 minutes of aerobic and resistance exercise 3 days/week. Limit Tylenol to 2000 mg daily.  -You can take this for headaches.  Continue at least 1 Ensure daily  Medications: Continue lactulose 30 mL twice daily.  If you have more than 4-5 bowel movements you can decrease your evening dose to 15 mL as needed.  The goal is for you to have 2-3 bowel movements daily Continue Xifaxan 550 mg twice daily Continue pantoprazole 40 mg twice daily Continue midodrine 3 times daily Continue spironolactone 50 mg daily Continue lasix 20 mg daily. On Monday, Wednesday, and Friday I want you to increase your lasix to 40 mg daily.   I am providing you some lab slips today, I would like for you to have this done around 10/30.  This is to recheck your hemoglobin as well as assess your potassium and sodium and kidney function with increase in diuretics.  Please let me know if your blood pressure drops below 90/60.  Avoid any NSAIDs.  Continue alcohol cessation.  Please keep your appointment for your upper endoscopy on 11/18 with Dr. Marletta Lor.  It was a pleasure to see you today. I want to create trusting relationships with patients. If you receive a survey regarding your visit,  I greatly appreciate you taking time to fill this out on paper or through your MyChart. I value your feedback.  Brooke Bonito, MSN, FNP-BC, AGACNP-BC [facility]

## 2023-04-20 NOTE — Progress Notes (Signed)
GI Office Note    Referring Provider: [name], MD Primary Care Physician:  [name], MD Primary Gastroenterologist: Hennie Duos. Marletta Lor, DO  Date:  04/20/2023  ID:  Shawn Velazquez, DOB 1966/02/05, MRN [id number]  Chief Complaint   Chief Complaint  Patient presents with   Follow-up    Follow up on alcoholic cirrhosis and hospital also pt needs EGD   History of Present Illness  Shawn Velazquez is a 57 y.o. male with a history of alcohol abuse, tobacco abuse, cirrhosis complicated by ascites, EGD, and esophageal varices, sleep apnea, diabetes, HTN presenting today for hospital follow-up.  Colonoscopy 04/05/2022: -Fair prep -2 polyps in the cecum -3 polyps in the transverse colon -8 mm polyp in the descending colon -Nonbleeding internal hemorrhoids -Path: Tubular adenomas -Repeat colonoscopy in 3 years  MRI liver with and without contrast 08/03/2022: -Chronic liver disease -Macronodular liver with multiple presumed regenerative nodules -No clear aggressive appearing lesions with restricted diffusion -Mild ascites -Varices on the stomach and lower esophagus -Mild splenic enlargement   Hospitalized at [facility] 08/26/2022 through 08/29/2022: Presented with hematemesis and melena for 3 days.  Noted to have previous admission at the end of January for anasarca.  He underwent EGD for hematemesis and melena during this hospitalization in February.  Procedure outlined below.  He was treated with octreotide and PPI infusion as well as IV Rocephin for SBP prophylaxis.  He was also treated with Carafate 1 g 4 times daily.  His MELD was 27 on admission with a DF of 45.9.  Was treated with prednisolone during his prior hospitalization in January.  Patient has reported intermittent drowsiness and confusion concerning for possible hepatic encephalopathy but has been maintained on lactulose.  Given significant bowel movements while inpatient his lactulose was decreased from 3 times daily to  twice daily.  Given he was still having alcohol use prednisolone was not started.  Prior to discharge he was advised to continue Lasix 20 mg daily 100 mg spironolactone daily.  His MELD 3.0 was 22 on discharge.  Per Dr. Levon Hedger, no need for Xifaxan at this time.  Alcohol cessation encouraged. Recommendations on discharge were to continue Carafate for 2 weeks, PPI twice daily, Lasix 40 mg daily, spironolactone 100 mg daily, lactulose 2-3 bowel movements daily, carvedilol 3.125 mg twice daily.  Need to arrange outpatient evaluation at transplant center given elevated levels.   EGD 08/26/2022: -grade 2 esophageal varices s/p banding x 3 with complete eradication, without bleeding -Single nonbleeding angiodysplastic lesion in the duodenum.  -Advised Carafate suspension 1 g before meals and nightly -Repeat EGD in 4-8 weeks.   Recent hospitalization 09/24/22-09/28/22.  He presented to the ED with hematemesis, having multiple episodes.  Reportedly continued to drink alcohol.  Reports some mild tremors and intermittent confusion but no worse than previous.  Complaining of lower extremity edema and abdominal distention.  History of Rocephin for SBP prophylaxis.  Underwent EGD with Dr. Jena Gauss.  IV twice daily and octreotide infusion.  Continued on lactulose as well as daily diuretic regimen of 40 mg of Lasix and 100 mg of spironolactone.  EGD as outlined below.  Was given vitamin K x 1 dose. Discharged on lasix 40 mg daily and spironolactone 150mg  daily (due to hypokalemia).  Recommended restarting Coreg 3.125 mg twice daily.   EGD 09/24/22: -4 columns of grade 2-3 esophageal varices, cherry red spots 1 on each of 2 columns without active bleeding -Portal hypertensive gastropathy with touch friability, no gastric  varices -Microvasive 7 shot Bander patch -1 band placed on each column with good hemostasis -Advise repeat banding in 4 weeks. -Continue PPI   Office visit 10/14/22. Advised to continue current  medications including Lasix 40 mg daily, spironolactone 150 mg daily, carvedilol 3.125 twice daily, lactulose, and PPI twice daily.  Start on Xifaxan 550 mg twice daily.  Advised he may use Gas-X as needed for bloating.  Due for liver imaging in July 2024.  Advised to proceed with EGD that was already scheduled. EGD cancelled given patient lost insurance 10/14/22.    Office visit 11/04/22. Increased N/V. Swelling fairly well controlled. Abdomen tight at times. Eating very little. Poor appetite. Was unsure if taking Xifaxin. 1-2 BM daily. Lactulose twice daily. Insurance reinstated. Advised labs and reschedule EGD. Start Xifaxin. Continue lasix, spironolactone (150) carvedilol 3.125 mg BID. Low sodium diet. Start ensures and follow up with [facility] regarding possible transplant candidacy.    Contacted by heme/onc PA 5/31 regarding patient with dizziness and shortness of breath who presented for iron infusion. He was given 500 cc bolus of NS and CBC checked. Hgb 10.6, plts 174. Last ferritin was 19 (11/11/22).  I was asked for recommendations for treatment and recommended decreasing spironolactone from 150mg  to 100 mg daily given BP was a little soft and asked to follow up in the office. His BP improved to 123/72 after fluids.Tolerated infusion without issue.    Office visit 12/07/22. BP has been on the lower side.  Stated it was fine all weekend.  Continues to have issues with insomnia.  Not yet taking his morning medications and not only had a little bit of water.  Not feeling dizzy.  Does feel sleepy.  Swelling has been controlled and denies any abdominal pain.  Taking Xifaxan as directed.  Nausea has been fairly well-controlled with Zofran.  Referral for the last couple weeks his BP has been on the lower side steadily decreasing but overall he has been feeling better than usual and no worsening confusion.  Appetite also improved.  Multiple rechecks of his blood pressure revealed ongoing hypotension therefore  he was advised to go to the ED for fluids and further evaluation.  Tried to discuss with hematology however they stated they were unable to accommodate him for fluid administration without going through the ED.  Given his blood pressure he was advised to stop carvedilol.   Hospitalization at [facility] 12/07/22 through 12/08/22.  He is orthostatic BPs were positive and he became mildly symptomatic.  He received IV hydration with fluid boluses.  Labs with AST 53, ALT 20, alk phos 131, T. bili 3.6 with indirect 2.7 and direct 0.9.  Albumin 2.3.  Sodium noted to be low at 126, potassium 3.9, and creatinine 0.83.  INR elevated at 1.9.  He was also noted to be bradycardic.  Initially held his Aldactone, Lasix, and Coreg.  He received IV fluids and albumin and was given midodrine.  He was advised to restart his Coreg on 6/6 and reduce his spironolactone to 50 mg daily starting on 6/6.  Advised to restart Lasix on Saturday 6/8 and continue to monitor BP at home.  Reportedly scheduled for iron infusion with hematology on 6/7.   Outpatient labs on 7/16 with elevated AFP at 10.1, INR 1.5, creatinine 0.92, potassium 3.3, albumin 3.5, alk phos 211, AST 73, ALT 29.  EGD 02/22/2023: -Grade 2 esophageal varices with stigmata of recent bleeding s/p banding -Portal hypertensive gastropathy -No gastric varices -Normal duodenum -Restart Coreg  on discharge -Continue Rocephin for 5 days -Continue octreotide for 3 days -Repeat EGD in 4 weeks  Last office visit 03/23/2023 with Dr. Marletta Lor.  Patient continued to report a few cans of beer weekly.  Taking 20 mg Lasix daily and Aldactone 50 mg daily but notes increased swelling in his abdomen and lower extremities.  Requested paracentesis.  Denied any melena, nausea, or vomiting.  On carvedilol 3.125 mg twice daily.  Hg well-controlled with lactulose and Xifaxan.  Nutritional recommendations given.  Advised to monitor blood pressure at home and call if his blood pressure drops below 90/60.   Continue lactulose and Xifaxan.  Due for repeat hepatoma screening in February 2025 with AFP.  Follow-up in 3-4 weeks.  Recent hospitalization at [facility] 04/02/2023 - 04/05/2023.  Patient presented with hypotension.  MELD 24, INR elevated at 2.  No encephalopathy on exam continued on lactulose and Xifaxan.  Received paracentesis to rule out SBP.  Chronically elevated AST/ALT with EtOH use.  DF elevated at 48 but steroids not given given noncompliance.  Continued to be hypotensive on presentation and bradycardic in the 50s.  He was feeling dizzy and fatigued on carvedilol 3.125 mg twice daily.  His diuretics and beta-blocker were on hold given hypotension.  He has a past continue midodrine 3 times daily.  Consider restarting Lasix and spironolactone at half dose with close monitoring of BP at home.  Advised to repeat EGD in 4 to 6 weeks until complete eradication of varices.  Advised to continue to hold carvedilol.  Last paracentesis 04/04/2023 with removal of 3.9 L of ascites.  No evidence of SBP on cell count.  Today: Still having some peripheral edema and started to have some more development in his abdomen although not quite as distended as when he was in the hospital. Some mornings he may have some peripheral edema and by lunch he has swelling that is bothersome. Some intermittent abdominal pain. Not taking NSAIDs.   Tries to adhere to diet recommendations, sometimes does not follow low-sodium.  Drinking about 1 protein shake per day (mostly ensure, sometimes boost). Usually drinks at least 30g. Has to eat before he takes medication because if he doesn't then he gets nauseas and may vomit.   Having 5 BM per day. Taking about 30ml BID usually. Has the urgency related to the lactulose.  Has had some intermittent melena or darker colored stools.  No consistent black stool.  Denies any BRBPR.  Still at home with his girlfriend.  Not living with his sister.  His sister does happen with his  medications.  Current Outpatient Medications  Medication Sig Dispense Refill   albuterol (VENTOLIN HFA) 108 (90 Base) MCG/ACT inhaler Inhale 2 puffs into the lungs every 6 (six) hours as needed for wheezing or shortness of breath. 8 g 2   Budeson-Glycopyrrol-Formoterol (BREZTRI AEROSPHERE) 160-9-4.8 MCG/ACT AERO Inhale 2 puffs into the lungs 2 (two) times daily.     busPIRone (BUSPAR) 5 MG tablet Take 1 tablet (5 mg total) by mouth daily as needed (anxiety).     Continuous Glucose Sensor (DEXCOM G7 SENSOR) MISC Inject 1 application  into the skin as directed. Change sensor every 10 days as directed. 9 each 3   feeding supplement (ENSURE ENLIVE / ENSURE PLUS) LIQD Take 237 mLs by mouth 2 (two) times daily between meals. 237 mL 12   FLUoxetine (PROZAC) 10 MG capsule Take 1 capsule (10 mg total) by mouth 2 (two) times daily. (Patient taking differently: Take 10-20 mg  by mouth See admin instructions. Take 20 mg in the morning, 10 mg at night.)     folic acid (FOLVITE) 1 MG tablet TAKE 1 TABLET BY MOUTH DAILY 30 tablet 5   furosemide (LASIX) 40 MG tablet Take 0.5 tablets (20 mg total) by mouth daily. 15 tablet 0   gabapentin (NEURONTIN) 100 MG capsule Take 100 mg by mouth daily.     glucose blood test strip Use as instructed to monitor glucose 4 times daily 100 each 12   insulin glargine (LANTUS SOLOSTAR) 100 UNIT/ML Solostar Pen Inject 30 Units into the skin at bedtime. 30 mL 3   insulin lispro (HUMALOG KWIKPEN) 100 UNIT/ML KwikPen Inject 8-14 Units into the skin 3 (three) times daily. (Patient taking differently: Inject 10-16 Units into the skin 3 (three) times daily before meals.) 36 mL 3   Insulin Pen Needle (PEN NEEDLES) 32G X 4 MM MISC 1 each by Does not apply route in the morning, at noon, in the evening, and at bedtime. Use to inject insulin 4 times daily 200 each 3   lactulose (CHRONULAC) 10 GM/15ML solution Take 30 mLs (20 g total) by mouth 2 (two) times daily. (Patient taking differently: Take  10 g by mouth 2 (two) times daily.) 946 mL 2   levocetirizine (XYZAL) 5 MG tablet Take 1 tablet by mouth daily.     MAGNESIUM PO Take 1 tablet by mouth daily. Magnesium Taurate     midodrine (PROAMATINE) 5 MG tablet Take 1 tablet (5 mg total) by mouth 3 (three) times daily with meals. To help Prevent Low Blood Pressure (Patient taking differently: Take 5 mg by mouth with breakfast, with lunch, and with evening meal. To help Prevent Low Blood Pressure) 90 tablet 3   Multiple Vitamins-Minerals (CENTRUM ADULT PO) Take 1 tablet by mouth daily.     ondansetron (ZOFRAN-ODT) 8 MG disintegrating tablet Take 8 mg by mouth every 8 (eight) hours as needed for nausea or vomiting.     pantoprazole (PROTONIX) 40 MG tablet Take 1 tablet (40 mg total) by mouth daily. 30 tablet 1   spironolactone (ALDACTONE) 50 MG tablet Take 1 tablet (50 mg total) by mouth daily. 30 tablet 0   Thiamine HCl (VITAMIN B-1 PO) Take 1 tablet by mouth daily.     XIFAXAN 550 MG TABS tablet TAKE 1 TABLET BY MOUTH TWICE DAILY 60 tablet 11   No current facility-administered medications for this visit.    Past Medical History:  Diagnosis Date   Anxiety    Arthritis    affects hands, shoulder, neck, knees, hips, ankles, toes   Chronic back pain    Diabetes mellitus    diet controlled   Eczema    Esophageal varices (HCC)    Fatty liver, alcoholic    GERD (gastroesophageal reflux disease)    Glaucoma    slight case   Hypertension    Iron deficiency anemia due to chronic blood loss 11/25/2022   Psoriasis    Sleep apnea    Substance abuse (HCC)    Vertigo 09/11/2017   Wears glasses     Past Surgical History:  Procedure Laterality Date   COLONOSCOPY WITH PROPOFOL N/A 04/05/2022   Procedure: COLONOSCOPY WITH PROPOFOL;  Surgeon: Lanelle Bal, DO;  Location: [facility];  Service: Endoscopy;  Laterality: N/A;  10:45 AM,unable to reach pt to move up   ESOPHAGEAL BANDING N/A 08/26/2022   Procedure: ESOPHAGEAL BANDING;   Surgeon: Lanelle Bal, DO;  Location:  [facility];  Service: Endoscopy;  Laterality: N/A;   ESOPHAGEAL BANDING  09/24/2022   Procedure: ESOPHAGEAL BANDING;  Surgeon: Corbin Ade, MD;  Location: [facility];  Service: Endoscopy;;   ESOPHAGEAL BANDING  02/22/2023   Procedure: ESOPHAGEAL BANDING;  Surgeon: Dolores Frame, MD;  Location: [facility];  Service: Gastroenterology;;   ESOPHAGOGASTRODUODENOSCOPY (EGD) WITH PROPOFOL N/A 08/26/2022   Procedure: ESOPHAGOGASTRODUODENOSCOPY (EGD) WITH PROPOFOL;  Surgeon: Lanelle Bal, DO;  [facility]: [facility];  Service: Endoscopy;  Laterality: N/A;   ESOPHAGOGASTRODUODENOSCOPY (EGD) WITH PROPOFOL N/A 09/24/2022   Procedure: ESOPHAGOGASTRODUODENOSCOPY (EGD) WITH PROPOFOL;  Surgeon: Corbin Ade, MD;  [facility]: [facility];  Service: Endoscopy;  Laterality: N/A;  hematemesis, esophageal varices, melena   ESOPHAGOGASTRODUODENOSCOPY (EGD) WITH PROPOFOL N/A 02/22/2023   Procedure: ESOPHAGOGASTRODUODENOSCOPY (EGD) WITH PROPOFOL;  Surgeon: Dolores Frame, MD;  [facility]: [facility];  Service: Gastroenterology;  Laterality: N/A;   MULTIPLE TOOTH EXTRACTIONS     POLYPECTOMY  04/05/2022   Procedure: POLYPECTOMY;  Surgeon: Lanelle Bal, DO;  [facility]: [facility];  Service: Endoscopy;;   POSTERIOR CERVICAL LAMINECTOMY  04/21/2012   Procedure: POSTERIOR CERVICAL LAMINECTOMY;  Surgeon: Mariam Dollar, MD;  Location: [facility];  Service: Neurosurgery;  Laterality: Left;  Posterior Cervical laminectomy/foraminotomy and diskectomy, left cervical seven-thoracic one   ROTATOR CUFF REPAIR     THORACIC DISCECTOMY Left 04/03/2014   Procedure: Left Thoracic Seven to Eight, Thoracic Eight to Nine Thoracic Discectomy ;  Surgeon: Mariam Dollar, MD;  Location: [facility];  Service: Neurosurgery;  Laterality: Left;    Family History  Problem Relation Age of Onset   Diabetes Father    Cancer - Lung Father    Hypertension Other     Cancer - Lung Other     Allergies as of 04/20/2023 - Review Complete 04/20/2023  Allergen Reaction Noted   Neosporin + pain relief max st [neomy-bacit-polymyx-pramoxine] Rash 04/19/2012    Social History   Socioeconomic History   Marital status: Divorced    Spouse name: Not on file   Number of children: Not on file   Years of education: Not on file   Highest education level: Not on file  Occupational History    Comment: Truck to storage  Tobacco Use   Smoking status: Every Day    Current packs/day: 0.00    Average packs/day: 0.3 packs/day for 40.0 years (10.0 ttl pk-yrs)    Types: Cigarettes    Start date: 05/09/1982    Last attempt to quit: 05/09/2022    Years since quitting: 0.9   Smokeless tobacco: Never   Tobacco comments:    pt trying to quit on his own  Vaping Use   Vaping status: Former  Substance and Sexual Activity   Alcohol use: Not Currently    Alcohol/week: 12.0 standard drinks of alcohol    Types: 12 Cans of beer per week    Comment: last drink 2 beers on Sunday 02/20/2023   Drug use: No   Sexual activity: Yes    Birth control/protection: None  Other Topics Concern   Not on file  Social History Narrative   Not on file   Social Determinants of Health   Financial Resource Strain: Low Risk  (03/02/2023)   Overall Financial Resource Strain (CARDIA)    Difficulty of Paying Living Expenses: Not very hard  Food Insecurity: No Food Insecurity (04/03/2023)   Hunger Vital Sign    Worried About Running  Out of Food in the Last Year: Never true    Ran Out of Food in the Last Year: Never true  Transportation Needs: No Transportation Needs (04/03/2023)   PRAPARE - Administrator, Civil Service (Medical): No    Lack of Transportation (Non-Medical): No  Physical Activity: Not on file  Stress: Stress Concern Present (10/07/2022)   [employer] of Occupational Health - Occupational Stress Questionnaire    Feeling of Stress : To some extent  Social  Connections: Not on file     Review of Systems   Gen: Denies fever, chills, anorexia. Denies fatigue, weakness, weight loss.  CV: Denies chest pain, palpitations, syncope, peripheral edema, and claudication. Resp: Denies dyspnea at rest, cough, wheezing, coughing up blood, and pleurisy. GI: See HPI Derm: Denies rash, itching, dry skin Psych: Denies depression, anxiety, memory loss, confusion. No homicidal or suicidal ideation.  Heme: Denies bruising, bleeding, and enlarged lymph nodes.   Physical Exam   BP 135/75   Pulse 97   Temp 98.4 F (36.9 C)   Ht 5\' 6"  (1.676 m)   Wt 192 lb 6.4 oz (87.3 kg)   BMI 31.05 kg/m   General:   Alert and oriented. No distress noted. Pleasant and cooperative.  Head:  Normocephalic and atraumatic. Eyes:  Conjuctiva clear without scleral icterus. Mouth:  Oral mucosa pink and moist. Good dentition. No lesions. Lungs:  Clear to auscultation bilaterally. No wheezes, rales, or rhonchi. No distress.  Heart:  S1, S2 present without murmurs appreciated.  Abdomen:  +BS, soft.  Distended, soft and not taut.  Some ascites present, not enough for paracentesis. No rebound or guarding.  Unable to assess for HSM. Msk:  Symmetrical without gross deformities. Normal posture. Extremities:  Without edema. Neurologic:  Alert and  oriented x4 Psych:  Alert and cooperative. Normal mood and affect.   Assessment  Shawn Velazquez is a 57 y.o. male with a history of alcohol abuse, tobacco abuse, cirrhosis complicated by ascites, EGD, and esophageal varices, sleep apnea, diabetes, HTN presenting today for hospital follow-up.  IDA: Continues to have fatigue however overall feeling better the last several days since hospitalization.  Hemoglobin prior to discharge stable at 10.7.  Continues to have thrombocytopenia with platelets 74.  Has continued to take oral iron supplementation daily.  Still having some intermittent melena which is likely related to his portal  hypertensive gastropathy and previously bleeding esophageal varices.  Colonoscopy up-to-date as of October 2023, due for surveillance in 2026.  He will continue oral iron.  Will have repeat EGD upcoming next month.  Alcoholic cirrhosis with ascites: Decompensated.  His course has been complicated by recurrent esophageal variceal bleeding, portal hypertensive gastropathy, hypotension, ascites, and hepatic cephalopathy.  Currently no HE maintained on lactulose and Xifaxan.  Previous CTA/BRTO in August 2024 without portal vein thrombosis.  Required paracentesis while inpatient with removal of 4 L.  No SBP.  Has had some recurrent ascites development since hospitalization given diuretics were briefly held due to hypotension.  He has been maintained on Lasix 20 mg daily Aldactone 50 mg daily since discharge and blood pressure doing well.  Will need to stay off of carvedilol given he has been intolerant.  Will refill increase Lasix to 40 mg 3 times a week and recheck labs in 2 weeks.  Discussed consultation for TIPS procedure given recurrent GI bleeding and paracenteses with difficulty maintaining adequate doses of diuretics.  He will continue on midodrine for BP stabilization.  MELD  3.0: 24 on discharge.  He reports no recent alcohol use since discharge from hospital.  Currently only having 1 cigarette/day.  GERD: Fairly well-controlled now with PPI twice daily.  As noted above is had recurrent GI bleeding.  Does well without any nausea or vomiting as long as he takes medications after eating food.  PLAN   Proceed with EGD 11/18 with Dr. Marletta Lor Continue Lasix 20 mg daily and Aldactone 50 mg daily.  Briefly increase Lasix to 40 mg Monday, Wednesday, and Friday.  Will check labs in 2 weeks. Continue to hold carvedilol.  Continue pantoprazole.  Continue midodrine TID Continue Ensure once daily Alcohol cessation Avoid NSAIDs 2 g sodium diet/high-protein diet Continue lactulose, titrate for 2-3 bowel  movements daily Continue Xifaxan 550 mg twice daily Recall for RUQ [place] February 2025 Recall for MELD labs and AFP February 2025 2g Tylenol daily limit CBC in 2 weeks Consider TIPS evaluation Follow up in 2 months.   May all okay  Brooke Bonito, MSN, FNP-BC, AGACNP-BC [facility]

## 2023-04-21 ENCOUNTER — Telehealth: Payer: Self-pay | Admitting: Internal Medicine

## 2023-04-21 DIAGNOSIS — Z0279 Encounter for issue of other medical certificate: Secondary | ICD-10-CM

## 2023-04-21 NOTE — Telephone Encounter (Signed)
There's [employer] papers in the front office drawer for patient to pick up and to pay $29. He needs to sign where I have it flagged, then make copies of papers for [place], give original to him. Then fax to [employer]. The cover sheet is already filled out. I left message for him and to come before 430 today or 1130am on Friday and there would be a $29 fee.

## 2023-04-26 ENCOUNTER — Telehealth: Payer: Self-pay

## 2023-04-26 NOTE — Telephone Encounter (Signed)
Notified Patient of completion of Disability Form. Request for medical records forwarded to [employer] with signed Release of Information Form. Fax transmission confirmations received. Copy of form and request for records mailed to Patient as requested. No other needs or concerns voiced at this time.

## 2023-04-27 ENCOUNTER — Encounter: Payer: Self-pay | Admitting: Hematology

## 2023-04-28 ENCOUNTER — Encounter: Payer: Self-pay | Admitting: Hematology

## 2023-04-28 ENCOUNTER — Inpatient Hospital Stay: Payer: BC Managed Care – PPO | Attending: Hematology

## 2023-04-28 ENCOUNTER — Ambulatory Visit (HOSPITAL_COMMUNITY): Payer: Self-pay

## 2023-04-28 DIAGNOSIS — D539 Nutritional anemia, unspecified: Secondary | ICD-10-CM

## 2023-04-28 DIAGNOSIS — D696 Thrombocytopenia, unspecified: Secondary | ICD-10-CM

## 2023-04-28 DIAGNOSIS — D649 Anemia, unspecified: Secondary | ICD-10-CM | POA: Diagnosis present

## 2023-04-28 LAB — CBC
HCT: 31.8 % — ABNORMAL LOW (ref 39.0–52.0)
Hemoglobin: 11.5 g/dL — ABNORMAL LOW (ref 13.0–17.0)
MCH: 38 pg — ABNORMAL HIGH (ref 26.0–34.0)
MCHC: 36.2 g/dL — ABNORMAL HIGH (ref 30.0–36.0)
MCV: 105 fL — ABNORMAL HIGH (ref 80.0–100.0)
Platelets: 86 10*3/uL — ABNORMAL LOW (ref 150–400)
RBC: 3.03 MIL/uL — ABNORMAL LOW (ref 4.22–5.81)
RDW: 14.3 % (ref 11.5–15.5)
WBC: 7.9 10*3/uL (ref 4.0–10.5)
nRBC: 0 % (ref 0.0–0.2)

## 2023-04-28 LAB — SAMPLE TO BLOOD BANK

## 2023-04-29 ENCOUNTER — Encounter: Payer: Self-pay | Admitting: Hematology

## 2023-05-02 ENCOUNTER — Ambulatory Visit (HOSPITAL_COMMUNITY): Payer: Self-pay

## 2023-05-02 ENCOUNTER — Telehealth: Payer: Self-pay | Admitting: Internal Medicine

## 2023-05-02 NOTE — Telephone Encounter (Signed)
I called patient again today and told him his [employer] papers had been completed and there would be a $29 fee and then I could fax them. I told him that he could come pick them up before 5pm M-Thurs, or before noon on Friday.

## 2023-05-03 ENCOUNTER — Encounter: Payer: Self-pay | Admitting: Hematology

## 2023-05-10 ENCOUNTER — Encounter: Payer: Self-pay | Admitting: Hematology

## 2023-05-13 ENCOUNTER — Other Ambulatory Visit: Payer: BC Managed Care – PPO

## 2023-05-14 ENCOUNTER — Encounter: Payer: Self-pay | Admitting: Hematology

## 2023-05-17 ENCOUNTER — Encounter: Payer: Self-pay | Admitting: Hematology

## 2023-05-18 ENCOUNTER — Inpatient Hospital Stay (HOSPITAL_COMMUNITY)
Admission: EM | Admit: 2023-05-18 | Discharge: 2023-05-24 | DRG: 432 | Disposition: A | Discharge status: Home / Self Care | Payer: BC Managed Care – PPO | Attending: Family Medicine | Admitting: Family Medicine

## 2023-05-18 ENCOUNTER — Emergency Department (HOSPITAL_COMMUNITY): Payer: BC Managed Care – PPO

## 2023-05-18 ENCOUNTER — Encounter (HOSPITAL_COMMUNITY): Payer: Self-pay | Admitting: Emergency Medicine

## 2023-05-18 ENCOUNTER — Other Ambulatory Visit: Payer: Self-pay

## 2023-05-18 DIAGNOSIS — I8500 Esophageal varices without bleeding: Secondary | ICD-10-CM | POA: Diagnosis not present

## 2023-05-18 DIAGNOSIS — R791 Abnormal coagulation profile: Secondary | ICD-10-CM | POA: Diagnosis not present

## 2023-05-18 DIAGNOSIS — D6959 Other secondary thrombocytopenia: Secondary | ICD-10-CM | POA: Diagnosis present

## 2023-05-18 DIAGNOSIS — E669 Obesity, unspecified: Secondary | ICD-10-CM

## 2023-05-18 DIAGNOSIS — R7401 Elevation of levels of liver transaminase levels: Secondary | ICD-10-CM | POA: Diagnosis not present

## 2023-05-18 DIAGNOSIS — D62 Acute posthemorrhagic anemia: Secondary | ICD-10-CM | POA: Diagnosis present

## 2023-05-18 DIAGNOSIS — J948 Other specified pleural conditions: Secondary | ICD-10-CM | POA: Diagnosis present

## 2023-05-18 DIAGNOSIS — I8510 Secondary esophageal varices without bleeding: Secondary | ICD-10-CM | POA: Diagnosis not present

## 2023-05-18 DIAGNOSIS — Z794 Long term (current) use of insulin: Secondary | ICD-10-CM | POA: Diagnosis not present

## 2023-05-18 DIAGNOSIS — Z79899 Other long term (current) drug therapy: Secondary | ICD-10-CM

## 2023-05-18 DIAGNOSIS — Z7189 Other specified counseling: Secondary | ICD-10-CM | POA: Diagnosis not present

## 2023-05-18 DIAGNOSIS — K921 Melena: Secondary | ICD-10-CM | POA: Diagnosis not present

## 2023-05-18 DIAGNOSIS — Z8719 Personal history of other diseases of the digestive system: Secondary | ICD-10-CM

## 2023-05-18 DIAGNOSIS — K7011 Alcoholic hepatitis with ascites: Secondary | ICD-10-CM | POA: Diagnosis present

## 2023-05-18 DIAGNOSIS — I10 Essential (primary) hypertension: Secondary | ICD-10-CM | POA: Diagnosis present

## 2023-05-18 DIAGNOSIS — K429 Umbilical hernia without obstruction or gangrene: Secondary | ICD-10-CM | POA: Diagnosis present

## 2023-05-18 DIAGNOSIS — J439 Emphysema, unspecified: Secondary | ICD-10-CM | POA: Diagnosis present

## 2023-05-18 DIAGNOSIS — E46 Unspecified protein-calorie malnutrition: Secondary | ICD-10-CM

## 2023-05-18 DIAGNOSIS — D689 Coagulation defect, unspecified: Secondary | ICD-10-CM | POA: Diagnosis present

## 2023-05-18 DIAGNOSIS — Z515 Encounter for palliative care: Secondary | ICD-10-CM | POA: Diagnosis not present

## 2023-05-18 DIAGNOSIS — Z888 Allergy status to other drugs, medicaments and biological substances status: Secondary | ICD-10-CM

## 2023-05-18 DIAGNOSIS — D539 Nutritional anemia, unspecified: Secondary | ICD-10-CM | POA: Diagnosis present

## 2023-05-18 DIAGNOSIS — I959 Hypotension, unspecified: Secondary | ICD-10-CM | POA: Diagnosis not present

## 2023-05-18 DIAGNOSIS — E8770 Fluid overload, unspecified: Secondary | ICD-10-CM

## 2023-05-18 DIAGNOSIS — Z01812 Encounter for preprocedural laboratory examination: Secondary | ICD-10-CM | POA: Diagnosis present

## 2023-05-18 DIAGNOSIS — J90 Pleural effusion, not elsewhere classified: Secondary | ICD-10-CM | POA: Diagnosis present

## 2023-05-18 DIAGNOSIS — K7290 Hepatic failure, unspecified without coma: Secondary | ICD-10-CM

## 2023-05-18 DIAGNOSIS — F1010 Alcohol abuse, uncomplicated: Secondary | ICD-10-CM | POA: Diagnosis present

## 2023-05-18 DIAGNOSIS — J9601 Acute respiratory failure with hypoxia: Secondary | ICD-10-CM | POA: Diagnosis present

## 2023-05-18 DIAGNOSIS — E876 Hypokalemia: Secondary | ICD-10-CM | POA: Diagnosis not present

## 2023-05-18 DIAGNOSIS — R600 Localized edema: Secondary | ICD-10-CM | POA: Diagnosis not present

## 2023-05-18 DIAGNOSIS — I8511 Secondary esophageal varices with bleeding: Secondary | ICD-10-CM | POA: Diagnosis present

## 2023-05-18 DIAGNOSIS — Z23 Encounter for immunization: Secondary | ICD-10-CM | POA: Diagnosis not present

## 2023-05-18 DIAGNOSIS — E1165 Type 2 diabetes mellitus with hyperglycemia: Secondary | ICD-10-CM | POA: Diagnosis present

## 2023-05-18 DIAGNOSIS — R54 Age-related physical debility: Secondary | ICD-10-CM | POA: Diagnosis present

## 2023-05-18 DIAGNOSIS — K7031 Alcoholic cirrhosis of liver with ascites: Secondary | ICD-10-CM | POA: Diagnosis present

## 2023-05-18 DIAGNOSIS — K7682 Hepatic encephalopathy: Secondary | ICD-10-CM | POA: Diagnosis present

## 2023-05-18 DIAGNOSIS — D696 Thrombocytopenia, unspecified: Secondary | ICD-10-CM

## 2023-05-18 DIAGNOSIS — F17210 Nicotine dependence, cigarettes, uncomplicated: Secondary | ICD-10-CM | POA: Diagnosis present

## 2023-05-18 DIAGNOSIS — K7040 Alcoholic hepatic failure without coma: Secondary | ICD-10-CM | POA: Diagnosis present

## 2023-05-18 DIAGNOSIS — K219 Gastro-esophageal reflux disease without esophagitis: Secondary | ICD-10-CM | POA: Diagnosis present

## 2023-05-18 DIAGNOSIS — K766 Portal hypertension: Secondary | ICD-10-CM | POA: Diagnosis not present

## 2023-05-18 DIAGNOSIS — E871 Hypo-osmolality and hyponatremia: Secondary | ICD-10-CM

## 2023-05-18 DIAGNOSIS — K3189 Other diseases of stomach and duodenum: Secondary | ICD-10-CM | POA: Diagnosis present

## 2023-05-18 DIAGNOSIS — Z6832 Body mass index (BMI) 32.0-32.9, adult: Secondary | ICD-10-CM

## 2023-05-18 DIAGNOSIS — K7460 Unspecified cirrhosis of liver: Secondary | ICD-10-CM | POA: Diagnosis not present

## 2023-05-18 DIAGNOSIS — E8809 Other disorders of plasma-protein metabolism, not elsewhere classified: Secondary | ICD-10-CM | POA: Diagnosis present

## 2023-05-18 DIAGNOSIS — Z833 Family history of diabetes mellitus: Secondary | ICD-10-CM

## 2023-05-18 DIAGNOSIS — Z8249 Family history of ischemic heart disease and other diseases of the circulatory system: Secondary | ICD-10-CM

## 2023-05-18 DIAGNOSIS — K700 Alcoholic fatty liver: Secondary | ICD-10-CM | POA: Diagnosis present

## 2023-05-18 DIAGNOSIS — K625 Hemorrhage of anus and rectum: Secondary | ICD-10-CM | POA: Diagnosis not present

## 2023-05-18 DIAGNOSIS — R140 Abdominal distension (gaseous): Secondary | ICD-10-CM | POA: Diagnosis not present

## 2023-05-18 DIAGNOSIS — K922 Gastrointestinal hemorrhage, unspecified: Secondary | ICD-10-CM | POA: Diagnosis present

## 2023-05-18 DIAGNOSIS — G4730 Sleep apnea, unspecified: Secondary | ICD-10-CM | POA: Diagnosis present

## 2023-05-18 DIAGNOSIS — K7030 Alcoholic cirrhosis of liver without ascites: Secondary | ICD-10-CM | POA: Diagnosis not present

## 2023-05-18 DIAGNOSIS — F1090 Alcohol use, unspecified, uncomplicated: Secondary | ICD-10-CM | POA: Diagnosis not present

## 2023-05-18 LAB — MAGNESIUM: Magnesium: 1.5 mg/dL — ABNORMAL LOW (ref 1.7–2.4)

## 2023-05-18 LAB — CBC WITH DIFFERENTIAL/PLATELET
Abs Immature Granulocytes: 0.04 10*3/uL (ref 0.00–0.07)
Basophils Absolute: 0.1 10*3/uL (ref 0.0–0.1)
Basophils Relative: 1 %
Eosinophils Absolute: 0.3 10*3/uL (ref 0.0–0.5)
Eosinophils Relative: 4 %
HCT: 25.5 % — ABNORMAL LOW (ref 39.0–52.0)
Hemoglobin: 9.5 g/dL — ABNORMAL LOW (ref 13.0–17.0)
Immature Granulocytes: 0 %
Lymphocytes Relative: 15 %
Lymphs Abs: 1.4 10*3/uL (ref 0.7–4.0)
MCH: 41.7 pg — ABNORMAL HIGH (ref 26.0–34.0)
MCHC: 37.3 g/dL — ABNORMAL HIGH (ref 30.0–36.0)
MCV: 111.8 fL — ABNORMAL HIGH (ref 80.0–100.0)
Monocytes Absolute: 1.4 10*3/uL — ABNORMAL HIGH (ref 0.1–1.0)
Monocytes Relative: 15 %
Neutro Abs: 6.1 10*3/uL (ref 1.7–7.7)
Neutrophils Relative %: 65 %
Platelets: 94 10*3/uL — ABNORMAL LOW (ref 150–400)
RBC: 2.28 MIL/uL — ABNORMAL LOW (ref 4.22–5.81)
RDW: 15.7 % — ABNORMAL HIGH (ref 11.5–15.5)
WBC: 9.2 10*3/uL (ref 4.0–10.5)
nRBC: 0 % (ref 0.0–0.2)

## 2023-05-18 LAB — COMPREHENSIVE METABOLIC PANEL
ALT: 40 U/L (ref 0–44)
AST: 116 U/L — ABNORMAL HIGH (ref 15–41)
Albumin: 2.4 g/dL — ABNORMAL LOW (ref 3.5–5.0)
Alkaline Phosphatase: 139 U/L — ABNORMAL HIGH (ref 38–126)
Anion gap: 11 (ref 5–15)
BUN: 11 mg/dL (ref 6–20)
CO2: 32 mmol/L (ref 22–32)
Calcium: 8.1 mg/dL — ABNORMAL LOW (ref 8.9–10.3)
Chloride: 86 mmol/L — ABNORMAL LOW (ref 98–111)
Creatinine, Ser: 0.79 mg/dL (ref 0.61–1.24)
GFR, Estimated: >60 mL/min (ref >60)
Glucose, Bld: 163 mg/dL — ABNORMAL HIGH (ref 70–99)
Potassium: 2.9 mmol/L — ABNORMAL LOW (ref 3.5–5.1)
Sodium: 129 mmol/L — ABNORMAL LOW (ref 135–145)
Total Bilirubin: 17.8 mg/dL — ABNORMAL HIGH (ref <1.2)
Total Protein: 5.8 g/dL — ABNORMAL LOW (ref 6.5–8.1)

## 2023-05-18 LAB — POC OCCULT BLOOD, ED

## 2023-05-18 LAB — TYPE AND SCREEN
ABO/RH(D): A POS
Antibody Screen: NEGATIVE

## 2023-05-18 LAB — ETHANOL: Alcohol, Ethyl (B): <10 mg/dL (ref <10)

## 2023-05-18 LAB — PROTIME-INR
INR: 3.1 — ABNORMAL HIGH (ref 0.8–1.2)
Prothrombin Time: 31.8 s — ABNORMAL HIGH (ref 11.4–15.2)

## 2023-05-18 MED ORDER — PROCHLORPERAZINE EDISYLATE 10 MG/2ML IJ SOLN: 10.0000 mg | Freq: Four times a day (QID) | INTRAMUSCULAR | Status: DC | PRN

## 2023-05-18 MED ORDER — OCTREOTIDE ACETATE 500 MCG/ML IJ SOLN
INTRAMUSCULAR | Status: AC
Start: 2023-05-18 — End: ?
  Filled 2023-05-18: qty 1

## 2023-05-18 MED ORDER — SODIUM CHLORIDE 0.9 % IV SOLN
1.0000 g | Freq: Once | INTRAVENOUS | Status: AC
  Administered 2023-05-18: 1 g via INTRAVENOUS
  Filled 2023-05-18: qty 10

## 2023-05-18 MED ORDER — POTASSIUM CHLORIDE 10 MEQ/100ML IV SOLN
10.0000 meq | INTRAVENOUS | Status: AC
  Administered 2023-05-18 (×2): 10 meq via INTRAVENOUS
  Filled 2023-05-18 (×2): qty 100

## 2023-05-18 MED ORDER — PANTOPRAZOLE SODIUM 40 MG IV SOLR
40.0000 mg | INTRAVENOUS | Status: AC
  Administered 2023-05-18 (×2): 40 mg via INTRAVENOUS
  Filled 2023-05-18 (×2): qty 10

## 2023-05-18 MED ORDER — INSULIN ASPART 100 UNIT/ML IJ SOLN
0.0000 [IU] | INTRAMUSCULAR | Status: DC
  Administered 2023-05-19: 2 [IU] via SUBCUTANEOUS
  Administered 2023-05-19: 3 [IU] via SUBCUTANEOUS
  Administered 2023-05-19: 8 [IU] via SUBCUTANEOUS
  Administered 2023-05-20: 2 [IU] via SUBCUTANEOUS
  Administered 2023-05-20 (×3): 3 [IU] via SUBCUTANEOUS
  Administered 2023-05-20: 5 [IU] via SUBCUTANEOUS
  Administered 2023-05-20: 2 [IU] via SUBCUTANEOUS
  Administered 2023-05-21: 8 [IU] via SUBCUTANEOUS
  Administered 2023-05-21 (×4): 3 [IU] via SUBCUTANEOUS
  Administered 2023-05-22: 8 [IU] via SUBCUTANEOUS
  Administered 2023-05-22 (×3): 3 [IU] via SUBCUTANEOUS
  Administered 2023-05-22: 2 [IU] via SUBCUTANEOUS
  Administered 2023-05-23: 8 [IU] via SUBCUTANEOUS
  Administered 2023-05-23: 2 [IU] via SUBCUTANEOUS
  Administered 2023-05-23: 3 [IU] via SUBCUTANEOUS
  Administered 2023-05-23: 5 [IU] via SUBCUTANEOUS
  Administered 2023-05-23: 3 [IU] via SUBCUTANEOUS
  Administered 2023-05-23: 5 [IU] via SUBCUTANEOUS
  Administered 2023-05-24: 3 [IU] via SUBCUTANEOUS
  Administered 2023-05-24: 5 [IU] via SUBCUTANEOUS
  Administered 2023-05-24: 3 [IU] via SUBCUTANEOUS
  Administered 2023-05-24: 11 [IU] via SUBCUTANEOUS
  Administered 2023-05-24: 3 [IU] via SUBCUTANEOUS

## 2023-05-18 MED ORDER — MAGNESIUM SULFATE 2 GM/50ML IV SOLN
2.0000 g | Freq: Once | INTRAVENOUS | Status: AC
  Administered 2023-05-18: 2 g via INTRAVENOUS
  Filled 2023-05-18: qty 50

## 2023-05-18 MED ORDER — PNEUMOCOCCAL 20-VAL CONJ VACC 0.5 ML IM SUSY
0.5000 mL | PREFILLED_SYRINGE | INTRAMUSCULAR | Status: AC
  Administered 2023-05-19: 0.5 mL via INTRAMUSCULAR
  Filled 2023-05-18: qty 0.5

## 2023-05-18 MED ORDER — SODIUM CHLORIDE 0.9 % IV SOLN
50.0000 ug/h | INTRAVENOUS | Status: DC
  Administered 2023-05-18 – 2023-05-21 (×7): 50 ug/h via INTRAVENOUS
  Filled 2023-05-18 (×10): qty 1

## 2023-05-18 MED ORDER — VITAMIN K1 10 MG/ML IJ SOLN
5.0000 mg | Freq: Once | INTRAMUSCULAR | Status: AC
  Administered 2023-05-18: 5 mg via SUBCUTANEOUS
  Filled 2023-05-18: qty 1
  Filled 2023-05-18: qty 0.5

## 2023-05-18 MED ORDER — POTASSIUM CHLORIDE CRYS ER 20 MEQ PO TBCR
40.0000 meq | EXTENDED_RELEASE_TABLET | Freq: Once | ORAL | Status: AC
Start: 2023-05-18 — End: 2023-05-18
  Administered 2023-05-18: 40 meq via ORAL
  Filled 2023-05-18: qty 2

## 2023-05-18 MED ORDER — PANTOPRAZOLE SODIUM 40 MG IV SOLR
40.0000 mg | Freq: Two times a day (BID) | INTRAVENOUS | Status: DC
  Administered 2023-05-19 – 2023-05-24 (×11): 40 mg via INTRAVENOUS
  Filled 2023-05-18 (×11): qty 10

## 2023-05-18 MED ORDER — OCTREOTIDE LOAD VIA INFUSION
50.0000 ug | Freq: Once | INTRAVENOUS | Status: AC
  Administered 2023-05-18: 50 ug via INTRAVENOUS
  Filled 2023-05-18: qty 25

## 2023-05-18 NOTE — ED Provider Notes (Incomplete)
[place] EMERGENCY DEPARTMENT AT [facility] Provider Note   CSN: [id number] Arrival date & time: 05/18/23  1850     History {Add pertinent medical, surgical, social history, OB history to HPI:1} Chief Complaint  Patient presents with   GI Bleeding    Shawn Velazquez is a 57 y.o. male.  57 year old male with a history of alcoholic cirrhosis and esophageal varices sp banding who presents to the emergency department with rectal bleeding.  Patient states that today he started having maroon-colored stools.  Has had 3.  Denies any abdominal pain or fevers.  Nausea but no vomiting.  Says that he still drinks alcohol occasionally.  Not on blood thinners.  No confusion recently and has been compliant with his lactulose.       Home Medications Prior to Admission medications   Medication Sig Start Date End Date Taking? Authorizing Provider  albuterol (VENTOLIN HFA) 108 (90 Base) MCG/ACT inhaler Inhale 2 puffs into the lungs every 6 (six) hours as needed for wheezing or shortness of breath. 02/28/23   Hongalgi, Maximino Greenland, MD  Budeson-Glycopyrrol-Formoterol (BREZTRI AEROSPHERE) 160-9-4.8 MCG/ACT AERO Inhale 2 puffs into the lungs 2 (two) times daily.    [provider]  busPIRone (BUSPAR) 5 MG tablet Take 1 tablet (5 mg total) by mouth daily as needed (anxiety). 02/28/23   Hongalgi, Maximino Greenland, MD  Continuous Glucose Sensor (DEXCOM G7 SENSOR) MISC Inject 1 application  into the skin as directed. Change sensor every 10 days as directed. 12/23/22   Dani Gobble, NP  feeding supplement (ENSURE ENLIVE / ENSURE PLUS) LIQD Take 237 mLs by mouth 2 (two) times daily between meals. 02/28/23   Hongalgi, Maximino Greenland, MD  FLUoxetine (PROZAC) 10 MG capsule Take 1 capsule (10 mg total) by mouth 2 (two) times daily. Patient taking differently: Take 10-20 mg by mouth See admin instructions. Take 20 mg in the morning, 10 mg at night. 02/28/23   Hongalgi, Maximino Greenland, MD  folic acid (FOLVITE) 1 MG  tablet TAKE 1 TABLET BY MOUTH DAILY 01/10/23   Aida Raider, NP  furosemide (LASIX) 40 MG tablet Take 0.5 tablets (20 mg total) by mouth daily. 04/05/23 04/04/24  Tyrone Nine, MD  gabapentin (NEURONTIN) 100 MG capsule Take 100 mg by mouth daily. 10/05/22   [provider]  glucose blood test strip Use as instructed to monitor glucose 4 times daily 12/09/21   Dani Gobble, NP  insulin glargine (LANTUS SOLOSTAR) 100 UNIT/ML Solostar Pen Inject 30 Units into the skin at bedtime. 12/23/22   Dani Gobble, NP  insulin lispro (HUMALOG KWIKPEN) 100 UNIT/ML KwikPen Inject 8-14 Units into the skin 3 (three) times daily. Patient taking differently: Inject 10-16 Units into the skin 3 (three) times daily before meals. 03/31/23   Dani Gobble, NP  Insulin Pen Needle (PEN NEEDLES) 32G X 4 MM MISC 1 each by Does not apply route in the morning, at noon, in the evening, and at bedtime. Use to inject insulin 4 times daily 05/12/22   Dani Gobble, NP  lactulose (CHRONULAC) 10 GM/15ML solution Take 30 mLs (20 g total) by mouth 2 (two) times daily. Patient taking differently: Take 10 g by mouth 2 (two) times daily. 02/28/23   Hongalgi, Maximino Greenland, MD  levocetirizine (XYZAL) 5 MG tablet Take 1 tablet by mouth daily. 10/05/22   [provider]  MAGNESIUM PO Take 1 tablet by mouth daily. Magnesium Taurate    [provider]  midodrine (PROAMATINE) 5 MG tablet Take 1 tablet (5 mg total) by mouth 3 (three) times daily with meals. To help Prevent Low Blood Pressure Patient taking differently: Take 5 mg by mouth with breakfast, with lunch, and with evening meal. To help Prevent Low Blood Pressure 12/08/22   Shon Hale, MD  Multiple Vitamins-Minerals (CENTRUM ADULT PO) Take 1 tablet by mouth daily.    [provider]  ondansetron (ZOFRAN-ODT) 8 MG disintegrating tablet Take 8 mg by mouth every 8 (eight) hours as needed for nausea or vomiting.    [provider]   pantoprazole (PROTONIX) 40 MG tablet Take 1 tablet (40 mg total) by mouth daily. 02/28/23 04/29/23  Elease Etienne, MD  spironolactone (ALDACTONE) 50 MG tablet Take 1 tablet (50 mg total) by mouth daily. 04/05/23   Tyrone Nine, MD  Thiamine HCl (VITAMIN B-1 PO) Take 1 tablet by mouth daily.    [provider]  XIFAXAN 550 MG TABS tablet TAKE 1 TABLET BY MOUTH TWICE DAILY 02/14/23 02/14/24  Aida Raider, NP      Allergies    Neosporin + pain relief max st [neomy-bacit-polymyx-pramoxine]    Review of Systems   Review of Systems  Physical Exam Updated Vital Signs BP 123/76   Pulse (!) 105   Temp 99.1 F (37.3 C)   Resp 18   Ht 5\' 6"  (1.676 m)   Wt 85.3 kg   SpO2 97%   BMI 30.34 kg/m  Physical Exam Vitals and nursing note reviewed.  Constitutional:      General: He is not in acute distress.    Appearance: He is well-developed.  HENT:     Head: Normocephalic and atraumatic.     Right Ear: External ear normal.     Left Ear: External ear normal.     Nose: Nose normal.  Eyes:     Extraocular Movements: Extraocular movements intact.     Conjunctiva/sclera: Conjunctivae normal.     Pupils: Pupils are equal, round, and reactive to light.  Cardiovascular:     Rate and Rhythm: Regular rhythm. Tachycardia present.  Pulmonary:     Effort: Pulmonary effort is normal. No respiratory distress.  Abdominal:     General: There is distension.     Palpations: Abdomen is soft. There is no mass.     Tenderness: There is no abdominal tenderness. There is no guarding.  Musculoskeletal:     Cervical back: Normal range of motion and neck supple.     Right lower leg: Edema present.     Left lower leg: Edema present.  Skin:    General: Skin is warm and dry.  Neurological:     Mental Status: He is alert. Mental status is at baseline.  Psychiatric:        Mood and Affect: Mood normal.        Behavior: Behavior normal.     ED Results / Procedures / Treatments   Labs (all  labs ordered are listed, but only abnormal results are displayed) Labs Reviewed  COMPREHENSIVE METABOLIC PANEL  CBC WITH DIFFERENTIAL/PLATELET  PROTIME-INR  ETHANOL  POC OCCULT BLOOD, ED  TYPE AND SCREEN    EKG None  Radiology No results found.  Procedures Procedures  {Document cardiac monitor, telemetry assessment procedure when appropriate:1}  Medications Ordered in ED Medications  pantoprazole (PROTONIX) injection 40 mg (has no administration in time range)    Followed by  pantoprazole (PROTONIX) injection 40 mg (has no administration in time  range)  octreotide (SANDOSTATIN) 2 mcg/mL load via infusion 50 mcg (has no administration in time range)    And  octreotide (SANDOSTATIN) 500 mcg in sodium chloride 0.9 % 250 mL (2 mcg/mL) infusion (has no administration in time range)  cefTRIAXone (ROCEPHIN) 1 g in sodium chloride 0.9 % 100 mL IVPB (has no administration in time range)    ED Course/ Medical Decision Making/ A&P   {   Click here for ABCD2, HEART and other calculatorsREFRESH Note before signing :1}                              Medical Decision Making Amount and/or Complexity of Data Reviewed Labs: ordered. Radiology: ordered.  Risk Prescription drug management. Decision regarding hospitalization.   ***  {Document critical care time when appropriate:1} {Document review of labs and clinical decision tools ie heart score, Chads2Vasc2 etc:1}  {Document your independent review of radiology images, and any outside records:1} {Document your discussion with family members, caretakers, and with consultants:1} {Document social determinants of health affecting pt's care:1} {Document your decision making why or why not admission, treatments were needed:1} Final Clinical Impression(s) / ED Diagnoses Final diagnoses:  None    Rx / DC Orders ED Discharge Orders     None

## 2023-05-18 NOTE — H&P (Signed)
History and Physical    Patient: Shawn Velazquez ZOX:[id number] DOB: 07-08-1965 DOA: 05/18/2023 DOS: the patient was seen and examined on 05/18/2023 PCP: [name], MD  Patient coming from: Home  Chief Complaint:  Chief Complaint  Patient presents with   GI Bleeding   HPI: Shawn Velazquez is a 57 y.o. male with medical history significant of GERD, esophageal varices, alcoholic cirrhosis who presents to the emergency department due to bleeding in the rectum.  Patient complained of 3-4-day onset of nausea and nonbloody vomiting as well as increased abdominal bloating.  Vomiting was 7-8 episodes daily.  He had about 3 -12 ounce beers yesterday, today, he complained of having blood in his stool which started this morning and he endorsed about 3 episodes already, so he activated EMS.  On arrival of EMS, patient was noted to be hypoxic with an O2 sat with 3% on room air, supplemental oxygen via [place] was provided at 3 LPM and patient was sent to the ED for further evaluation and management.  Patient states that he has been compliant with his lactulose.  He states that he does not shake or avoid withdrawal symptoms if he does not drink alcohol.  ED Course:  In the ED, pulse was 105 bpm, other vital signs were within normal range; O2 sat was 97% on supplemental oxygen via [place] at 3 LPM (patient does not use oxygen at baseline).  Workup in the ED showed macrocytic anemia and thrombocytopenia.  BMP showed sodium 139, potassium 2.9, chloride 86, bicarb 32, blood glucose 163, BUN 11, creatinine 0.79, Albumin 2.4 AST 116, ALT 40, alkaline phosphatase 139, total bilirubin 17.8, magnesium 1.5.  FOBT was positive Chest x-ray showed moderate  left-sided effusion, increased compared to prior. Probable small right effusion. Airspace disease at the left base, probable atelectasis. Emphysema Patient was treated with IV ceftriaxone, octreotide drip, IV Protonix. Gastroenterologist (Dr. Jena Gauss) was consulted and  recommended admitting patient and place him n.p.o. at midnight.  Hospitalist was asked to admit patient for further evaluation and management.  Review of Systems: Review of systems as noted in the HPI. All other systems reviewed and are negative.   Past Medical History:  Diagnosis Date   Anxiety    Arthritis    affects hands, shoulder, neck, knees, hips, ankles, toes   Chronic back pain    Diabetes mellitus    diet controlled   Eczema    Esophageal varices (HCC)    Fatty liver, alcoholic    GERD (gastroesophageal reflux disease)    Glaucoma    slight case   Hypertension    Iron deficiency anemia due to chronic blood loss 11/25/2022   Psoriasis    Sleep apnea    Substance abuse (HCC)    Vertigo 09/11/2017   Wears glasses    Past Surgical History:  Procedure Laterality Date   COLONOSCOPY WITH PROPOFOL N/A 04/05/2022   Procedure: COLONOSCOPY WITH PROPOFOL;  Surgeon: Lanelle Bal, DO;  Location: [facility];  Service: Endoscopy;  Laterality: N/A;  10:45 AM,unable to reach pt to move up   ESOPHAGEAL BANDING N/A 08/26/2022   Procedure: ESOPHAGEAL BANDING;  Surgeon: Lanelle Bal, DO;  Location: [facility];  Service: Endoscopy;  Laterality: N/A;   ESOPHAGEAL BANDING  09/24/2022   Procedure: ESOPHAGEAL BANDING;  Surgeon: Corbin Ade, MD;  Location: [facility];  Service: Endoscopy;;   ESOPHAGEAL BANDING  02/22/2023   Procedure: ESOPHAGEAL BANDING;  Surgeon: Dolores Frame, MD;  Location: [facility];  Service: Gastroenterology;;   ESOPHAGOGASTRODUODENOSCOPY (EGD) WITH PROPOFOL N/A 08/26/2022   Procedure: ESOPHAGOGASTRODUODENOSCOPY (EGD) WITH PROPOFOL;  Surgeon: Lanelle Bal, DO;  [facility]: [facility];  Service: Endoscopy;  Laterality: N/A;   ESOPHAGOGASTRODUODENOSCOPY (EGD) WITH PROPOFOL N/A 09/24/2022   Procedure: ESOPHAGOGASTRODUODENOSCOPY (EGD) WITH PROPOFOL;  Surgeon: Corbin Ade, MD;  Location: [facility];  Service: Endoscopy;   Laterality: N/A;  hematemesis, esophageal varices, melena   ESOPHAGOGASTRODUODENOSCOPY (EGD) WITH PROPOFOL N/A 02/22/2023   Procedure: ESOPHAGOGASTRODUODENOSCOPY (EGD) WITH PROPOFOL;  Surgeon: Dolores Frame, MD;  [facility]: [facility];  Service: Gastroenterology;  Laterality: N/A;   MULTIPLE TOOTH EXTRACTIONS     POLYPECTOMY  04/05/2022   Procedure: POLYPECTOMY;  Surgeon: Lanelle Bal, DO;  Location: [facility];  Service: Endoscopy;;   POSTERIOR CERVICAL LAMINECTOMY  04/21/2012   Procedure: POSTERIOR CERVICAL LAMINECTOMY;  Surgeon: Mariam Dollar, MD;  Location: [facility];  Service: Neurosurgery;  Laterality: Left;  Posterior Cervical laminectomy/foraminotomy and diskectomy, left cervical seven-thoracic one   ROTATOR CUFF REPAIR     THORACIC DISCECTOMY Left 04/03/2014   Procedure: Left Thoracic Seven to Eight, Thoracic Eight to Nine Thoracic Discectomy ;  Surgeon: Mariam Dollar, MD;  Location: [facility];  Service: Neurosurgery;  Laterality: Left;    Social History:  reports that he has been smoking cigarettes. He started smoking about 41 years ago. He has a 10 pack-year smoking history. He has never used smokeless tobacco. He reports that he does not currently use alcohol after a past usage of about 12.0 standard drinks of alcohol per week. He reports that he does not use drugs.   Allergies  Allergen Reactions   Neosporin + Pain Relief Max St [Neomy-Bacit-Polymyx-Pramoxine] Rash    Family History  Problem Relation Age of Onset   Diabetes Father    Cancer - Lung Father    Hypertension Other    Cancer - Lung Other      Prior to Admission medications   Medication Sig Start Date End Date Taking? Authorizing Provider  albuterol (VENTOLIN HFA) 108 (90 Base) MCG/ACT inhaler Inhale 2 puffs into the lungs every 6 (six) hours as needed for wheezing or shortness of breath. 02/28/23  Yes Hongalgi, Maximino Greenland, MD  BELSOMRA 10 MG TABS Take 1 tablet by mouth daily. 04/19/23  Yes  [provider]  Budeson-Glycopyrrol-Formoterol (BREZTRI AEROSPHERE) 160-9-4.8 MCG/ACT AERO Inhale 2 puffs into the lungs 2 (two) times daily.   Yes [provider]  busPIRone (BUSPAR) 5 MG tablet Take 1 tablet (5 mg total) by mouth daily as needed (anxiety). 02/28/23  Yes Hongalgi, Maximino Greenland, MD  feeding supplement (ENSURE ENLIVE / ENSURE PLUS) LIQD Take 237 mLs by mouth 2 (two) times daily between meals. 02/28/23  Yes Hongalgi, Maximino Greenland, MD  FLUoxetine (PROZAC) 10 MG capsule Take 1 capsule (10 mg total) by mouth 2 (two) times daily. Patient taking differently: Take 10-20 mg by mouth See admin instructions. Take 20 mg in the morning, 10 mg at night. 02/28/23  Yes Hongalgi, Maximino Greenland, MD  folic acid (FOLVITE) 1 MG tablet TAKE 1 TABLET BY MOUTH DAILY 01/10/23  Yes Aida Raider, NP  furosemide (LASIX) 40 MG tablet Take 0.5 tablets (20 mg total) by mouth daily. 04/05/23 04/04/24 Yes Tyrone Nine, MD  gabapentin (NEURONTIN) 100 MG capsule Take 100 mg by mouth daily. 10/05/22  Yes [provider]  insulin glargine (LANTUS SOLOSTAR) 100 UNIT/ML Solostar Pen Inject  30 Units into the skin at bedtime. 12/23/22  Yes Reardon, Freddi Starr, NP  insulin lispro (HUMALOG KWIKPEN) 100 UNIT/ML KwikPen Inject 8-14 Units into the skin 3 (three) times daily. Patient taking differently: Inject 10-16 Units into the skin 3 (three) times daily before meals. 03/31/23  Yes Reardon, Freddi Starr, NP  lactulose (CHRONULAC) 10 GM/15ML solution Take 30 mLs (20 g total) by mouth 2 (two) times daily. Patient taking differently: Take 10 g by mouth 2 (two) times daily. 02/28/23  Yes Hongalgi, Maximino Greenland, MD  levocetirizine (XYZAL) 5 MG tablet Take 1 tablet by mouth daily. 10/05/22  Yes [provider]  MAGNESIUM PO Take 1 tablet by mouth daily. Magnesium Taurate   Yes [provider]  midodrine (PROAMATINE) 5 MG tablet Take 1 tablet (5 mg total) by mouth 3 (three) times daily with meals. To help Prevent Low  Blood Pressure Patient taking differently: Take 5 mg by mouth with breakfast, with lunch, and with evening meal. To help Prevent Low Blood Pressure 12/08/22  Yes Emokpae, Courage, MD  Multiple Vitamins-Minerals (CENTRUM ADULT PO) Take 1 tablet by mouth daily.   Yes [provider]  ondansetron (ZOFRAN-ODT) 8 MG disintegrating tablet Take 8 mg by mouth every 8 (eight) hours as needed for nausea or vomiting.   Yes [provider]  pantoprazole (PROTONIX) 40 MG tablet Take 1 tablet (40 mg total) by mouth daily. 02/28/23 05/18/23 Yes Hongalgi, Maximino Greenland, MD  spironolactone (ALDACTONE) 50 MG tablet Take 1 tablet (50 mg total) by mouth daily. 04/05/23  Yes Tyrone Nine, MD  Thiamine HCl (VITAMIN B-1 PO) Take 1 tablet by mouth daily.   Yes [provider]  Continuous Glucose Sensor (DEXCOM G7 SENSOR) MISC Inject 1 application  into the skin as directed. Change sensor every 10 days as directed. 12/23/22   Dani Gobble, NP  glucose blood test strip Use as instructed to monitor glucose 4 times daily 12/09/21   Dani Gobble, NP  Insulin Pen Needle (PEN NEEDLES) 32G X 4 MM MISC 1 each by Does not apply route in the morning, at noon, in the evening, and at bedtime. Use to inject insulin 4 times daily 05/12/22   Dani Gobble, NP    Physical Exam: BP 107/63   Pulse (!) 104   Temp 99.1 F (37.3 C)   Resp 11   Ht 5\' 6"  (1.676 m)   Wt 85.3 kg   SpO2 99%   BMI 30.34 kg/m   General: 57 y.o. year-old male well developed well nourished in no acute distress.  Alert and oriented x3. HEENT: NCAT, EOMI, icteric sclera. Neck: Supple, trachea medial Cardiovascular: Tachycardia.  Regular rate and rhythm with no rubs or gallops.  No thyromegaly or JVD noted.  Bilateral +2 lower extremity edema. 2/4 pulses in all 4 extremities. Respiratory: Clear to auscultation with no wheezes or rales. Good inspiratory effort. Abdomen: Soft, ascites, nontender but distended with normal bowel  sounds x4 quadrants. Muskuloskeletal: No cyanosis, clubbing or edema noted bilaterally Neuro: CN II-XII intact, strength 5/5 x 4, sensation, reflexes intact Skin: No ulcerative lesions noted or rashes Psychiatry: Judgement and insight appear normal. Mood is appropriate for condition and setting          Labs on Admission:  Basic Metabolic Panel: Recent Labs  Lab 05/18/23 1944  NA 129*  K 2.9*  CL 86*  CO2 32  GLUCOSE 163*  BUN 11  CREATININE 0.79  CALCIUM 8.1*  MG 1.5*  Liver Function Tests: Recent Labs  Lab 05/18/23 1944  AST 116*  ALT 40  ALKPHOS 139*  BILITOT 17.8*  PROT 5.8*  ALBUMIN 2.4*   No results for input(s): "LIPASE", "AMYLASE" in the last 168 hours. No results for input(s): "AMMONIA" in the last 168 hours. CBC: Recent Labs  Lab 05/18/23 1944  WBC 9.2  NEUTROABS 6.1  HGB 9.5*  HCT 25.5*  MCV 111.8*  PLT 94*   Cardiac Enzymes: No results for input(s): "CKTOTAL", "CKMB", "CKMBINDEX", "TROPONINI" in the last 168 hours.  BNP (last 3 results) Recent Labs    08/02/22 1224 04/02/23 1727  BNP 96.0 341.0*    ProBNP (last 3 results) No results for input(s): "PROBNP" in the last 8760 hours.  CBG: No results for input(s): "GLUCAP" in the last 168 hours.  Radiological Exams on Admission: DG Chest Portable 1 View  Result Date: 05/18/2023 CLINICAL DATA:  Shortness of breath EXAM: PORTABLE CHEST 1 VIEW COMPARISON:  04/02/2023 FINDINGS: Moderate left-sided effusion is increased compared to prior. Probable small right effusion. Airspace disease at the left base. Stable cardiomediastinal silhouette. Underlying emphysema and bronchitic changes. IMPRESSION: Moderate left-sided effusion, increased compared to prior. Probable small right effusion. Airspace disease at the left base, probable atelectasis. Emphysema Electronically Signed   By: Jasmine Pang M.D.   On: 05/18/2023 21:08    EKG: I independently viewed the EKG done and my findings are as followed:  EKG was not done in the ED  Assessment/Plan Present on Admission:  GI bleed  Alcoholic cirrhosis of liver with ascites (HCC)  Hypokalemia  Hyponatremia  Macrocytic anemia  Uncontrolled type 2 diabetes mellitus with hyperglycemia (HCC)  Principal Problem:   GI bleed Active Problems:   Hyponatremia   Alcoholic cirrhosis of liver with ascites (HCC)   Uncontrolled type 2 diabetes mellitus with hyperglycemia (HCC)   Hypokalemia   Macrocytic anemia   Acute respiratory failure with hypoxia (HCC)   Hypomagnesemia   Hypoalbuminemia due to protein-calorie malnutrition (HCC)   Obesity (BMI 30-39.9)  GI bleed H/H= 9.5/25.5, this was 11.5/31.8 on 04/28/2023 Hemoccult was positive Continue IV Protonix 40 mg twice daily IV octreotide drip started in the ED Gastroenterologist was consulted by EDP and will follow-up with patient in the morning  Acute respiratory failure with hypoxia This may be due to patient being fluid overloaded Continue supplemental oxygen via  at 3 LPM to maintain O2 sats > 92% with plan to wean patient off this as tolerated  Hypomagnesemia Magnesium 1.5, this will be repleted  Hypokalemia K+ 2.9, this was replenished  Alcoholic cirrhosis of liver with ascites IR will be consulted for possible paracentesis Continue home rifaximin, lactulose  Hypoalbuminemia possibly secondary to moderate protein calorie malnutrition Albumin 2.4.  Consider IV albumin during paracentesis  Transaminitis This is possibly due to patient's history of alcoholic cirrhosis AST 116, ALT 40, ALP 139; continue to monitor liver enzymes  Hyperbilirubinemia/thrombocytopenia possibly secondary to alcoholic cirrhosis Total bilirubin 17.8, continue to monitor bilirubin level  Hyponatremia This is possibly secondary to diuretics and hypovolemia Hold diuretics at this time  T2DM with hyperglycemia Continue ISS and hypoglycemia protocol  Macrocytic anemia Vitamin B12 and folate levels  will be checked  GERD Continue Protonix  Obesity (BMI 30.34) Diet and lifestyle modification   DVT prophylaxis: SCDs  Code Status: Full code  Family Communication: None at bedside  Consults: Gastroenterology  Severity of Illness: The appropriate patient status for this patient is INPATIENT. Inpatient status is judged to be reasonable  and necessary in order to provide the required intensity of service to ensure the patient's safety. The patient's presenting symptoms, physical exam findings, and initial radiographic and laboratory data in the context of their chronic comorbidities is felt to place them at high risk for further clinical deterioration. Furthermore, it is not anticipated that the patient will be medically stable for discharge from the hospital within 2 midnights of admission.   * I certify that at the point of admission it is my clinical judgment that the patient will require inpatient hospital care spanning beyond 2 midnights from the point of admission due to high intensity of service, high risk for further deterioration and high frequency of surveillance required.*  Author: Frankey Shown, DO 05/18/2023 11:07 PM  For on call review [email or web address].

## 2023-05-18 NOTE — ED Notes (Signed)
POC occult blood positive  

## 2023-05-18 NOTE — ED Triage Notes (Signed)
Pt c/o dark urine and bright red blood in stool. Pt was 83 % on room air, ems started him on 3lpm of oxygen.

## 2023-05-19 DIAGNOSIS — K7011 Alcoholic hepatitis with ascites: Secondary | ICD-10-CM | POA: Diagnosis not present

## 2023-05-19 DIAGNOSIS — E1165 Type 2 diabetes mellitus with hyperglycemia: Secondary | ICD-10-CM | POA: Diagnosis not present

## 2023-05-19 DIAGNOSIS — K922 Gastrointestinal hemorrhage, unspecified: Secondary | ICD-10-CM | POA: Diagnosis not present

## 2023-05-19 DIAGNOSIS — I8511 Secondary esophageal varices with bleeding: Secondary | ICD-10-CM | POA: Diagnosis not present

## 2023-05-19 DIAGNOSIS — K7031 Alcoholic cirrhosis of liver with ascites: Secondary | ICD-10-CM | POA: Diagnosis not present

## 2023-05-19 DIAGNOSIS — F1090 Alcohol use, unspecified, uncomplicated: Secondary | ICD-10-CM

## 2023-05-19 DIAGNOSIS — K921 Melena: Secondary | ICD-10-CM

## 2023-05-19 LAB — COMPREHENSIVE METABOLIC PANEL
ALT: 37 U/L (ref 0–44)
AST: 98 U/L — ABNORMAL HIGH (ref 15–41)
Albumin: 2 g/dL — ABNORMAL LOW (ref 3.5–5.0)
Alkaline Phosphatase: 111 U/L (ref 38–126)
Anion gap: 12 (ref 5–15)
BUN: 13 mg/dL (ref 6–20)
CO2: 30 mmol/L (ref 22–32)
Calcium: 7.8 mg/dL — ABNORMAL LOW (ref 8.9–10.3)
Chloride: 90 mmol/L — ABNORMAL LOW (ref 98–111)
Creatinine, Ser: 0.73 mg/dL (ref 0.61–1.24)
GFR, Estimated: >60 mL/min (ref >60)
Glucose, Bld: 106 mg/dL — ABNORMAL HIGH (ref 70–99)
Potassium: 3.4 mmol/L — ABNORMAL LOW (ref 3.5–5.1)
Sodium: 132 mmol/L — ABNORMAL LOW (ref 135–145)
Total Bilirubin: 16.3 mg/dL — ABNORMAL HIGH (ref <1.2)
Total Protein: 4.9 g/dL — ABNORMAL LOW (ref 6.5–8.1)

## 2023-05-19 LAB — CBC
HCT: 24.7 % — ABNORMAL LOW (ref 39.0–52.0)
Hemoglobin: 8.4 g/dL — ABNORMAL LOW (ref 13.0–17.0)
MCH: 37.7 pg — ABNORMAL HIGH (ref 26.0–34.0)
MCHC: 34 g/dL (ref 30.0–36.0)
MCV: 110.8 fL — ABNORMAL HIGH (ref 80.0–100.0)
Platelets: 74 10*3/uL — ABNORMAL LOW (ref 150–400)
RBC: 2.23 MIL/uL — ABNORMAL LOW (ref 4.22–5.81)
RDW: 15.8 % — ABNORMAL HIGH (ref 11.5–15.5)
WBC: 10.4 10*3/uL (ref 4.0–10.5)
nRBC: 0 % (ref 0.0–0.2)

## 2023-05-19 LAB — PROTIME-INR
INR: 3 — ABNORMAL HIGH (ref 0.8–1.2)
Prothrombin Time: 31.1 s — ABNORMAL HIGH (ref 11.4–15.2)

## 2023-05-19 LAB — GLUCOSE, CAPILLARY
Glucose-Capillary: 101 mg/dL — ABNORMAL HIGH (ref 70–99)
Glucose-Capillary: 112 mg/dL — ABNORMAL HIGH (ref 70–99)
Glucose-Capillary: 143 mg/dL — ABNORMAL HIGH (ref 70–99)
Glucose-Capillary: 110 mg/dL — ABNORMAL HIGH (ref 70–99)
Glucose-Capillary: 152 mg/dL — ABNORMAL HIGH (ref 70–99)
Glucose-Capillary: 285 mg/dL — ABNORMAL HIGH (ref 70–99)

## 2023-05-19 LAB — VITAMIN B12: Vitamin B-12: 1797 pg/mL — ABNORMAL HIGH (ref 180–914)

## 2023-05-19 LAB — MRSA NEXT GEN BY PCR, NASAL: MRSA by PCR Next Gen: NOT DETECTED

## 2023-05-19 LAB — FOLATE: Folate: 16.6 ng/mL (ref >5.9)

## 2023-05-19 MED ORDER — VITAMIN K1 10 MG/ML IJ SOLN
10.0000 mg | Freq: Every day | INTRAVENOUS | Status: AC
  Administered 2023-05-20 – 2023-05-21 (×2): 10 mg via INTRAVENOUS
  Filled 2023-05-19 (×2): qty 1

## 2023-05-19 MED ORDER — SODIUM CHLORIDE 0.9 % IV SOLN
1.0000 g | INTRAVENOUS | Status: AC
Start: 2023-05-19 — End: 2023-05-22
  Administered 2023-05-19 – 2023-05-22 (×4): 1 g via INTRAVENOUS
  Filled 2023-05-19 (×4): qty 10

## 2023-05-19 MED ORDER — VITAMIN K1 10 MG/ML IJ SOLN
10.0000 mg | Freq: Once | INTRAVENOUS | Status: AC
  Administered 2023-05-19: 10 mg via INTRAVENOUS
  Filled 2023-05-19: qty 1

## 2023-05-19 MED ORDER — CHLORHEXIDINE GLUCONATE CLOTH 2 % EX PADS
6.0000 | MEDICATED_PAD | Freq: Every day | CUTANEOUS | Status: DC
  Administered 2023-05-19 – 2023-05-21 (×4): 6 via TOPICAL

## 2023-05-19 MED ORDER — POTASSIUM CHLORIDE CRYS ER 20 MEQ PO TBCR
40.0000 meq | EXTENDED_RELEASE_TABLET | ORAL | Status: AC
  Administered 2023-05-19 (×2): 40 meq via ORAL
  Filled 2023-05-19 (×2): qty 2

## 2023-05-19 MED ORDER — OCTREOTIDE ACETATE 500 MCG/ML IJ SOLN
INTRAMUSCULAR | Status: AC
  Filled 2023-05-19: qty 1

## 2023-05-19 MED ORDER — MAGNESIUM SULFATE 4 GM/100ML IV SOLN
4.0000 g | Freq: Once | INTRAVENOUS | Status: AC
  Administered 2023-05-19: 4 g via INTRAVENOUS
  Filled 2023-05-19: qty 100

## 2023-05-19 NOTE — Pre-Procedure Instructions (Signed)
CBC sent to Dr Marletta Lor.

## 2023-05-19 NOTE — Plan of Care (Signed)
  Problem: Education: Goal: Knowledge of General Education information will improve Description: Including pain rating scale, medication(s)/side effects and non-pharmacologic comfort measures Outcome: Progressing   Problem: Health Behavior/Discharge Planning: Goal: Ability to manage health-related needs will improve Outcome: Progressing   Problem: Clinical Measurements: Goal: Ability to maintain clinical measurements within normal limits will improve Outcome: Progressing Goal: Will remain free from infection Outcome: Progressing Goal: Diagnostic test results will improve Outcome: Progressing Goal: Respiratory complications will improve Outcome: Progressing Goal: Cardiovascular complication will be avoided Outcome: Progressing   Problem: Activity: Goal: Risk for activity intolerance will decrease Outcome: Progressing   Problem: Nutrition: Goal: Adequate nutrition will be maintained Outcome: Progressing   Problem: Coping: Goal: Level of anxiety will decrease Outcome: Progressing   Problem: Elimination: Goal: Will not experience complications related to bowel motility Outcome: Progressing Goal: Will not experience complications related to urinary retention Outcome: Progressing   Problem: Safety: Goal: Ability to remain free from injury will improve Outcome: Progressing   Problem: Pain Management: Goal: General experience of comfort will improve Outcome: Progressing   Problem: Education: Goal: Ability to describe self-care measures that may prevent or decrease complications (Diabetes Survival Skills Education) will improve Outcome: Progressing Goal: Individualized Educational Video(s) Outcome: Progressing   Problem: Coping: Goal: Ability to adjust to condition or change in health will improve Outcome: Progressing   Problem: Fluid Volume: Goal: Ability to maintain a balanced intake and output will improve Outcome: Progressing   Problem: Health  Behavior/Discharge Planning: Goal: Ability to identify and utilize available resources and services will improve Outcome: Progressing Goal: Ability to manage health-related needs will improve Outcome: Progressing   Problem: Metabolic: Goal: Ability to maintain appropriate glucose levels will improve Outcome: Progressing   Problem: Nutritional: Goal: Maintenance of adequate nutrition will improve Outcome: Progressing Goal: Progress toward achieving an optimal weight will improve Outcome: Progressing   Problem: Skin Integrity: Goal: Risk for impaired skin integrity will decrease Outcome: Progressing   Problem: Tissue Perfusion: Goal: Adequacy of tissue perfusion will improve Outcome: Progressing

## 2023-05-19 NOTE — TOC Initial Note (Signed)
Transition of Care [facility]) - Initial/Assessment Note    Patient Details  Name: Shawn Velazquez MRN: [id number] Date of Birth: [birth year]  Transition of [facility]) CM/SW Contact:    Leitha Bleak, RN Phone Number: 05/19/2023, 4:35 PM  Clinical Narrative:      Patient admitted with GI bleed. Patient has a high risk for readmission. TOC at the bedside. Patient lives with his girlfriend. She provides transportation as needed. He is independent of ADL's. TOC consulted for Substance abuse, Patient states he drives once a week. He is open to [employer] TOC adding resources to AVS. TOC following. Patient will have procedures tomorrow.         Expected Discharge Plan: Home/Self Care Barriers to Discharge: Barriers Resolved  Patient Goals and CMS Choice Patient states their goals for this hospitalization and ongoing recovery are:: to get better CMS Medicare.gov Compare Post Acute Care list provided to:: Patient Choice offered to / list presented to : Patient     Expected Discharge Plan and Services      Living arrangements for the past 2 months: Single Family Home                      Prior Living Arrangements/Services Living arrangements for the past 2 months: Single Family Home Lives with:: Significant Other Patient language and need for interpreter reviewed:: Yes Do you feel safe going back to the place where you live?: Yes      Need for Family Participation in Patient Care: Yes (Comment) Care giver support system in place?: Yes (comment)   Criminal Activity/Legal Involvement Pertinent to Current Situation/Hospitalization: No - Comment as needed  Activities of Daily Living   ADL Screening (condition at time of admission) Independently performs ADLs?: Yes (appropriate for developmental age) Is the patient deaf or have difficulty hearing?: No Does the patient have difficulty seeing, even when wearing glasses/contacts?: No Does the patient have difficulty concentrating,  remembering, or making decisions?: No  Permission Sought/Granted                  Emotional Assessment     Affect (typically observed): Accepting Orientation: : Oriented to Self, Oriented to Place, Oriented to  Time, Oriented to Situation Alcohol / Substance Use: Alcohol Use Psych Involvement: No (comment)  Admission diagnosis:  GI bleed [K92.2] Patient Active Problem List   Diagnosis Date Noted   Acute respiratory failure with hypoxia (HCC) 05/18/2023   Hypomagnesemia 05/18/2023   Hypoalbuminemia due to protein-calorie malnutrition (HCC) 05/18/2023   Obesity (BMI 30-39.9) 05/18/2023   Hepatic cirrhosis (HCC) 04/05/2023   Hypotension 04/02/2023   Other ascites 02/24/2023   Portal vein thrombosis 02/23/2023   GI bleed 02/22/2023   Elevated bilirubin 02/22/2023   Esophageal varices with bleeding in diseases classified elsewhere (HCC) 02/22/2023   Symptomatic hypotension 12/07/2022   Iron deficiency anemia due to chronic blood loss 11/25/2022   Decompensated hepatic cirrhosis (HCC) 09/27/2022   Thrombocytopenia 09/26/2022   Acute GI bleeding 09/25/2022   Gastrointestinal hemorrhage with hematemesis 09/24/2022   Encephalopathy, hepatic (HCC) 08/28/2022   Secondary esophageal varices with bleeding (HCC) 08/27/2022   ABLA (acute blood loss anemia) 08/26/2022   Upper GI bleed 08/25/2022   Hyponatremia 08/25/2022   QT prolongation 08/25/2022   Macrocytic anemia 08/25/2022   Alcoholic hepatitis with ascites 08/04/2022   Alcoholic cirrhosis of liver with ascites (HCC) 08/03/2022   Hypokalemia 08/03/2022   Anasarca 08/02/2022   Alcohol abuse 08/02/2022   Lower abdominal pain  05/12/2022   Elevated LFTs 05/12/2022   Diabetes mellitus (HCC) 02/18/2022   Alcoholic fatty liver 02/18/2022   Arthritis 02/18/2022   Chronic back pain 02/18/2022   Eczema 02/18/2022   Gastroesophageal reflux disease 02/18/2022   Glaucoma 02/18/2022   Psoriasis 02/18/2022   Substance abuse (HCC)  02/18/2022   Class 1 obesity 10/29/2021   Anxiety and depression 08/01/2021   Acute bronchitis 07/21/2021   Sleep apnea 07/21/2021   Uncontrolled type 2 diabetes mellitus with hyperglycemia (HCC) 07/01/2020   Anxiety 07/01/2020   Essential hypertension 07/01/2020   Hyperlipidemia 07/01/2020   Screening for colon cancer 07/01/2020   Vertigo 09/11/2017   HNP (herniated nucleus pulposus), thoracic 04/03/2014   PCP:  [name], MD Pharmacy:   [facility] [phone] - [place], [place] - [address] [place] [address] Phone: [phone] Fax: [phone]  Social Determinants of Health (SDOH) Social History: SDOH Screenings   Food Insecurity: No Food Insecurity (05/18/2023)  Housing: Low Risk  (05/18/2023)  Transportation Needs: No Transportation Needs (05/18/2023)  Utilities: Not At Risk (05/18/2023)  Depression (PHQ2-9): Medium Risk (03/02/2023)  Financial Resource Strain: Low Risk  (03/02/2023)  Stress: Stress Concern Present (10/07/2022)  Tobacco Use: High Risk (05/18/2023)  Health Literacy: Adequate Health Literacy (03/02/2023)   SDOH Interventions:     Readmission Risk Interventions    05/19/2023    4:21 PM 09/27/2022   11:21 AM 08/26/2022   11:17 AM  Readmission Risk Prevention Plan  Transportation Screening Complete Complete Complete  Home Care Screening   Complete  Medication Review (RN CM)   Complete  HRI or Home Care Consult  Complete   Social Work Consult for Recovery Care Planning/Counseling  Complete   Palliative Care Screening  Not Applicable   Medication Review Oceanographer) Complete Complete   PCP or Specialist appointment within 3-5 days of discharge Not Complete    HRI or Home Care Consult Complete    SW Recovery Care/Counseling Consult Complete    Palliative Care Screening Not Applicable    Skilled Nursing Facility Not Applicable

## 2023-05-19 NOTE — Plan of Care (Signed)
  Problem: Education: Goal: Knowledge of General Education information will improve Description: Including pain rating scale, medication(s)/side effects and non-pharmacologic comfort measures Outcome: Progressing   Problem: Health Behavior/Discharge Planning: Goal: Ability to manage health-related needs will improve Outcome: Progressing   Problem: Clinical Measurements: Goal: Ability to maintain clinical measurements within normal limits will improve Outcome: Progressing Goal: Respiratory complications will improve Outcome: Progressing Goal: Cardiovascular complication will be avoided Outcome: Progressing   Problem: Elimination: Goal: Will not experience complications related to urinary retention Outcome: Progressing   Problem: Pain Management: Goal: General experience of comfort will improve Outcome: Progressing

## 2023-05-19 NOTE — Progress Notes (Signed)
PROGRESS NOTE     Shawn Velazquez, is a 57 y.o. male, DOB - August 06, 1965, [id number]  Admit date - 05/18/2023   Admitting Physician Frankey Shown, DO  Outpatient Primary MD for the patient is [name], MD  LOS - 1  Chief Complaint  Patient presents with   GI Bleeding        Brief Narrative:  57 y.o. male with medical history significant of GERD, esophageal varices, alcoholic cirrhosis admitted on 05/18/2023 with concerns about GI bleed and electrolyte abnormalities    -Assessment and Plan: 1) acute GI bleed -GI consult appreciated -INR too high to allow for endoluminal evaluation by GI team -Continue IV octreotide -Monitor H&H and transfuse as clinically indicated -INR 3.1 give vitamin K -Continue Protonix  2) hyponatremia/hypomagnesemia/hypokalemia--in the setting of alcohol abuse, replace and recheck electrolytes  3) alcoholic liver cirrhosis with ascites--decompensated liver disease -Elevated LFTs noted pattern consistent with alcoholic liver disease -GI consult appreciated -Lactulose as ordered- --continue rifaximin -Rocephin for SBP prophylaxis  4)DM2- Use Novolog/Humalog Sliding scale insulin with Accu-Cheks/Fingersticks as ordered   5) acute on chronic anemia--- Hgb down to 8.4 from a baseline usually around 10 -Monitor closely and transfuse as clinically indicated  6) thrombocytopenia--due to underlying liver cirrhosis, -Monitor closely and transfuse as clinically indicated - Status is: Inpatient   Disposition: The patient is from: Home              Anticipated d/c is to: Home              Anticipated d/c date is: 3 days              Patient currently is not medically stable to d/c. Barriers: Not Clinically Stable-   Code Status :  -  Code Status: Full Code   Family Communication:    NA (patient is alert, awake and coherent)   DVT Prophylaxis  :   - SCDs *  SCDs Start: 05/18/23 2248   Lab Results  Component Value Date   PLT 74 (L)  05/19/2023    Inpatient Medications  Scheduled Meds:  Chlorhexidine Gluconate Cloth  6 each Topical Daily   insulin aspart  0-15 Units Subcutaneous Q4H   pantoprazole (PROTONIX) IV  40 mg Intravenous Q12H   potassium chloride  40 mEq Oral Q3H   Continuous Infusions:  cefTRIAXone (ROCEPHIN)  IV Stopped (05/19/23 1102)   octreotide (SANDOSTATIN) 500 mcg in sodium chloride 0.9 % 250 mL (2 mcg/mL) infusion 50 mcg/hr (05/19/23 1805)   [START ON 05/20/2023] phytonadione (VITAMIN K) 10 mg in dextrose 5 % 50 mL IVPB     PRN Meds:.prochlorperazine   Anti-infectives (From admission, onward)    Start     Dose/Rate Route Frequency Ordered Stop   05/19/23 1030  cefTRIAXone (ROCEPHIN) 1 g in sodium chloride 0.9 % 100 mL IVPB        1 g 200 mL/hr over 30 Minutes Intravenous Every 24 hours 05/19/23 0942     05/18/23 1915  cefTRIAXone (ROCEPHIN) 1 g in sodium chloride 0.9 % 100 mL IVPB        1 g 200 mL/hr over 30 Minutes Intravenous  Once 05/18/23 1911 05/18/23 2016         Subjective: Keaghan Kovalenko today has no fevers, no emesis,  No chest pain,   Objective: Vitals:   05/19/23 1300 05/19/23 1400 05/19/23 1500 05/19/23 1636  BP: (!) 99/54 (!) 103/50 114/61   Pulse: 95     Resp:  13 11    Temp:    97.6 F (36.4 C)  TempSrc:    Oral  SpO2: 97% 98%    Weight:      Height:        Intake/Output Summary (Last 24 hours) at 05/19/2023 1827 Last data filed at 05/19/2023 1805 Gross per 24 hour  Intake 1148.87 ml  Output 175 ml  Net 973.87 ml   Filed Weights   05/18/23 1857 05/19/23 0821  Weight: 85.3 kg 90 kg    Physical Exam  Gen:- Awake Alert,  in no apparent distress  HEENT:- [place].AT,  +ve sclera icterus Neck-Supple Neck,No JVD,.  Lungs-  CTAB , fair symmetrical air movement CV- S1, S2 normal, regular  Abd-  +ve B.Sounds, Abd Soft, epigastric tenderness,    Extremity/Skin:-Jaundice, pedal pulses present  Psych-affect is appropriate, oriented x3 Neuro-no new focal  deficits, no tremors  Data Reviewed: I have personally reviewed following labs and imaging studies  CBC: Recent Labs  Lab 05/18/23 1944 05/19/23 0415  WBC 9.2 10.4  NEUTROABS 6.1  --   HGB 9.5* 8.4*  HCT 25.5* 24.7*  MCV 111.8* 110.8*  PLT 94* 74*   Basic Metabolic Panel: Recent Labs  Lab 05/18/23 1944 05/19/23 0415  NA 129* 132*  K 2.9* 3.4*  CL 86* 90*  CO2 32 30  GLUCOSE 163* 106*  BUN 11 13  CREATININE 0.79 0.73  CALCIUM 8.1* 7.8*  MG 1.5*  --    GFR: Estimated Creatinine Clearance: 104.5 mL/min (by C-G formula based on SCr of 0.73 mg/dL). Liver Function Tests: Recent Labs  Lab 05/18/23 1944 05/19/23 0415  AST 116* 98*  ALT 40 37  ALKPHOS 139* 111  BILITOT 17.8* 16.3*  PROT 5.8* 4.9*  ALBUMIN 2.4* 2.0*    Recent Results (from the past 240 hour(s))  MRSA Next Gen by PCR, Nasal     Status: None   Collection Time: 05/19/23 12:13 AM   Specimen: Nasal Mucosa; Nasal Swab  Result Value Ref Range Status   MRSA by PCR Next Gen NOT DETECTED NOT DETECTED Final    Comment: (NOTE) The GeneXpert MRSA Assay (FDA approved for NASAL specimens only), is one component of a comprehensive MRSA colonization surveillance program. It is not intended to diagnose MRSA infection nor to guide or monitor treatment for MRSA infections. Test performance is not FDA approved in patients less than 73 years old. Performed at [facility], [address], [place], [place] [address]       Radiology Studies: DG Chest Portable 1 View  Result Date: 05/18/2023 CLINICAL DATA:  Shortness of breath EXAM: PORTABLE CHEST 1 VIEW COMPARISON:  04/02/2023 FINDINGS: Moderate left-sided effusion is increased compared to prior. Probable small right effusion. Airspace disease at the left base. Stable cardiomediastinal silhouette. Underlying emphysema and bronchitic changes. IMPRESSION: Moderate left-sided effusion, increased compared to prior. Probable small right effusion. Airspace disease at the  left base, probable atelectasis. Emphysema Electronically Signed   By: Jasmine Pang M.D.   On: 05/18/2023 21:08     Scheduled Meds:  Chlorhexidine Gluconate Cloth  6 each Topical Daily   insulin aspart  0-15 Units Subcutaneous Q4H   pantoprazole (PROTONIX) IV  40 mg Intravenous Q12H   potassium chloride  40 mEq Oral Q3H   Continuous Infusions:  cefTRIAXone (ROCEPHIN)  IV Stopped (05/19/23 1102)   octreotide (SANDOSTATIN) 500 mcg in sodium chloride 0.9 % 250 mL (2 mcg/mL) infusion 50 mcg/hr (05/19/23 1805)   [START ON 05/20/2023] phytonadione (  VITAMIN K) 10 mg in dextrose 5 % 50 mL IVPB       LOS: 1 day    Shon Hale M.D on 05/19/2023 at 6:27 PM  Go to [email or web address] - for contact info  [facility] - Office  [phone]  If 7PM-7AM, please contact night-coverage [email or web address] 05/19/2023, 6:27 PM

## 2023-05-19 NOTE — Consult Note (Addendum)
Gastroenterology Consult   Referring Provider: No ref. provider found Primary Care Physician:  [name], MD Primary Gastroenterologist:  Hennie Duos. Marletta Lor, DO   Patient ID: Shawn Velazquez; [id number]; [birth year]   Admit date: 05/18/2023  LOS: 1 day   Date of Consultation: 05/19/2023  Reason for Consultation:  GI bleed    History of Present Illness   Shawn Velazquez is a 57 y.o. male with history of alcohol abuse, alcoholic hepatitis, decompensated etoh cirrhosis complicated by portal HTN, ascites, hepatic encephalopathy, esophageal varices status post multiple bleeds with subsequent EVL, hypotension, sleep apnea, diabetes, GERD, hypertension, IDA presented to the emergency department with 3 to 4-day history of nausea with nonbloody emesis, abdominal bloating, blood in the stool which started the morning of presentation.  In the ED: On arrival of EMS, patient noted to be hypoxic with O2 sats of 83% on room air, supplemental oxygen via nasal cannula provided at 3 L/min patient brought to the ED for further evaluation.  In the ED heart rate of 105, O2 sats 97% on supplemental oxygen via nasal cannula at 3 L/min, blood pressure 123/76.  Chest x-ray showed moderate left-sided effusion, increased compared to prior.  Probable small right effusion.  Airspace disease at the left base, probable atelectasis.  Emphysema.  Patient was started with IV ceftriaxone, octreotide drip, IV Protonix.  Labs while in the ED sodium 129, potassium 2.9, creatinine 0.79, albumin 2.4, AST 116, ALT 40, total bilirubin 17.8, alk phos 139, hemoglobin 9.5 (down from 11.5 three weeks prior), MCV 111.8, platelets 94,000, INR 3.1, alcohol less than 10, magnesium 1.5, Hemoccult positive stool  Today: B12 1797, folate 16.6, sodium 132, potassium 3.4, creatinine 0.73, albumin 2, AST 98, ALT 37, alk phos 111, total bilirubin 16.3. CBC white blood cell count 10,400, hemoglobin 8.4, hematocrit 24.7, platelets  74000.  GI consult: Patient states that he really has not had vomiting, more like coughing up stuff.  Having some heartburn issues.  Some nausea.  Was having some abdominal pain yesterday but none today.  Bowel movements 3-4 times per day with lactulose.  Stools are loose.  Denies melena.  Yesterday went to stand up and had started "pouring blood" from his rectum.  States blood was fresh looking and maroon and had several episodes.  Denies clots.  Again denies melena.  No alcohol in close to 48 hours.  States he does not drink every day at this point but still drinking 312 ounce beers when he does drink.  Since admission he has not been able to urinate.  Just urinated 175 cc tea colored urine.   MELD 3.0: currently 77, was 24 April 04, 2023. [place]: HCC screening completed CT August 2024 with no hepatoma.  Recall for RUQ U/S 08/2023 AFP: 10.1 on 01/18/23. 3.9 on 07/2022 Hep A/B vaccination: 03/2022 hep b surf ab NR EGD:  02/2023 BB: Carvedilol stopped due to hypotension. Midodrine TID. Ascites/peripheral edema: Yes Diuretics: Did not tolerate lasix 40mg , Aldactone 150mg , Coreg 3.125mg  BID due to hypotension. Coreg stopped briefly in 12/2022 but resumed with reduced dosing of Lasix/Aldactone. Ultimately taking Lasix 20mg  daily, Aldactone 50mg  daily, Midodrine 5mg  TID, and Coreg on hold.    Paracentesis: Yes, last para 04/04/23 with 3.9L removed History of SBP: No Encephalopathy:  Yes, on lactulose and Xifaxan 550mg  BID.   CTA A/P BRTO 02/2023 IMPRESSION: 1. Hepatic cirrhosis with evidence of portal hypertension (esophageal varices, small volume ascites and moderate left hepatic hydrothorax). 2. No evidence of  portal venous thrombosis. 3. Hypertrophic left gastric vein and short gastric veins result in moderate esophageal varices. 4.  Aortic Atherosclerosis (ICD10-I70.0). 5. Right inguinal hernia containing omental fat and ascitic fluid. 6. Cholelithiasis.   Procedure history:   EGD  08/26/2022: -grade 2 esophageal varices s/p banding x 3 with complete eradication, without bleeding -Single nonbleeding angiodysplastic lesion in the duodenum.  -Advised Carafate suspension 1 g before meals and nightly -Repeat EGD in 4-8 weeks.   EGD 09/24/22: -4 columns of grade 2-3 esophageal varices, cherry red spots 1 on each of 2 columns without active bleeding -Portal hypertensive gastropathy with touch friability, no gastric varices -Microvasive 7 shot Bander patch -1 band placed on each column with good hemostasis -Advise repeat banding in 4 weeks. -Continue PPI   EGD 02/22/2023: -3 columns of grade 2 esophageal varices, with stigmata of recent bleeding, scarring from prior banding s/p banding x3 -Portal hypertensive gastropathy, no gastric varices  Colonoscopy 04/05/2022: -Fair prep -2 polyps in the cecum -3 polyps in the transverse colon -8 mm polyp in the descending colon -Nonbleeding internal hemorrhoids -Path: Tubular adenomas -Repeat colonoscopy in 3 years   Prior to Admission medications   Medication Sig Start Date End Date Taking? Authorizing Provider  albuterol (VENTOLIN HFA) 108 (90 Base) MCG/ACT inhaler Inhale 2 puffs into the lungs every 6 (six) hours as needed for wheezing or shortness of breath. 02/28/23  Yes Hongalgi, Maximino Greenland, MD  BELSOMRA 10 MG TABS Take 1 tablet by mouth daily. 04/19/23  Yes [provider]  Budeson-Glycopyrrol-Formoterol (BREZTRI AEROSPHERE) 160-9-4.8 MCG/ACT AERO Inhale 2 puffs into the lungs 2 (two) times daily.   Yes [provider]  busPIRone (BUSPAR) 5 MG tablet Take 1 tablet (5 mg total) by mouth daily as needed (anxiety). 02/28/23  Yes Hongalgi, Maximino Greenland, MD  feeding supplement (ENSURE ENLIVE / ENSURE PLUS) LIQD Take 237 mLs by mouth 2 (two) times daily between meals. 02/28/23  Yes Hongalgi, Maximino Greenland, MD  FLUoxetine (PROZAC) 10 MG capsule Take 1 capsule (10 mg total) by mouth 2 (two) times daily. Patient taking  differently: Take 10-20 mg by mouth See admin instructions. Take 20 mg in the morning, 10 mg at night. 02/28/23  Yes Hongalgi, Maximino Greenland, MD  folic acid (FOLVITE) 1 MG tablet TAKE 1 TABLET BY MOUTH DAILY 01/10/23  Yes Aida Raider, NP  furosemide (LASIX) 40 MG tablet Take 0.5 tablets (20 mg total) by mouth daily. 04/05/23 04/04/24 Yes Tyrone Nine, MD  gabapentin (NEURONTIN) 100 MG capsule Take 100 mg by mouth daily. 10/05/22  Yes [provider]  insulin glargine (LANTUS SOLOSTAR) 100 UNIT/ML Solostar Pen Inject 30 Units into the skin at bedtime. 12/23/22  Yes Reardon, Freddi Starr, NP  insulin lispro (HUMALOG KWIKPEN) 100 UNIT/ML KwikPen Inject 8-14 Units into the skin 3 (three) times daily. Patient taking differently: Inject 10-16 Units into the skin 3 (three) times daily before meals. 03/31/23  Yes Reardon, Freddi Starr, NP  lactulose (CHRONULAC) 10 GM/15ML solution Take 30 mLs (20 g total) by mouth 2 (two) times daily. Patient taking differently: Take 10 g by mouth 2 (two) times daily. 02/28/23  Yes Hongalgi, Maximino Greenland, MD  levocetirizine (XYZAL) 5 MG tablet Take 1 tablet by mouth daily. 10/05/22  Yes [provider]  MAGNESIUM PO Take 1 tablet by mouth daily. Magnesium Taurate   Yes [provider]  midodrine (PROAMATINE) 5 MG tablet Take 1 tablet (5 mg total) by mouth 3 (three) times daily with  meals. To help Prevent Low Blood Pressure Patient taking differently: Take 5 mg by mouth with breakfast, with lunch, and with evening meal. To help Prevent Low Blood Pressure 12/08/22  Yes Emokpae, Courage, MD  Multiple Vitamins-Minerals (CENTRUM ADULT PO) Take 1 tablet by mouth daily.   Yes [provider]  ondansetron (ZOFRAN-ODT) 8 MG disintegrating tablet Take 8 mg by mouth every 8 (eight) hours as needed for nausea or vomiting.   Yes [provider]  pantoprazole (PROTONIX) 40 MG tablet Take 1 tablet (40 mg total) by mouth daily. 02/28/23 05/18/23 Yes Hongalgi, Maximino Greenland, MD   spironolactone (ALDACTONE) 50 MG tablet Take 1 tablet (50 mg total) by mouth daily. 04/05/23  Yes Tyrone Nine, MD  Thiamine HCl (VITAMIN B-1 PO) Take 1 tablet by mouth daily.   Yes [provider]  Continuous Glucose Sensor (DEXCOM G7 SENSOR) MISC Inject 1 application  into the skin as directed. Change sensor every 10 days as directed. 12/23/22   Dani Gobble, NP  glucose blood test strip Use as instructed to monitor glucose 4 times daily 12/09/21   Dani Gobble, NP  Insulin Pen Needle (PEN NEEDLES) 32G X 4 MM MISC 1 each by Does not apply route in the morning, at noon, in the evening, and at bedtime. Use to inject insulin 4 times daily 05/12/22   Dani Gobble, NP    Current Facility-Administered Medications  Medication Dose Route Frequency Provider Last Rate Last Admin   Chlorhexidine Gluconate Cloth 2 % PADS 6 each  6 each Topical Daily Adefeso, Oladapo, DO   6 each at 05/19/23 0012   insulin aspart (novoLOG) injection 0-15 Units  0-15 Units Subcutaneous Q4H Adefeso, Oladapo, DO   2 Units at 05/19/23 0021   octreotide (SANDOSTATIN) 500 mcg in sodium chloride 0.9 % 250 mL (2 mcg/mL) infusion  50 mcg/hr Intravenous Continuous Rondel Baton, MD 25 mL/hr at 05/19/23 0600 50 mcg/hr at 05/19/23 0600   pantoprazole (PROTONIX) injection 40 mg  40 mg Intravenous Q12H Rondel Baton, MD   40 mg at 05/19/23 0553   pneumococcal 20-valent conjugate vaccine (PREVNAR 20) injection 0.5 mL  0.5 mL Intramuscular Tomorrow-1000 Adefeso, Oladapo, DO       prochlorperazine (COMPAZINE) injection 10 mg  10 mg Intravenous Q6H PRN Adefeso, Oladapo, DO        Allergies as of 05/18/2023 - Review Complete 05/18/2023  Allergen Reaction Noted   Neosporin + pain relief max st [neomy-bacit-polymyx-pramoxine] Rash 04/19/2012    Past Medical History:  Diagnosis Date   Anxiety    Arthritis    affects hands, shoulder, neck, knees, hips, ankles, toes   Chronic back pain    Diabetes  mellitus    diet controlled   Eczema    Esophageal varices (HCC)    Fatty liver, alcoholic    GERD (gastroesophageal reflux disease)    Glaucoma    slight case   Hypertension    Iron deficiency anemia due to chronic blood loss 11/25/2022   Psoriasis    Sleep apnea    Substance abuse (HCC)    Vertigo 09/11/2017   Wears glasses     Past Surgical History:  Procedure Laterality Date   COLONOSCOPY WITH PROPOFOL N/A 04/05/2022   Procedure: COLONOSCOPY WITH PROPOFOL;  Surgeon: Lanelle Bal, DO;  Location: [facility];  Service: Endoscopy;  Laterality: N/A;  10:45 AM,unable to reach pt to move up   ESOPHAGEAL BANDING N/A 08/26/2022  Procedure: ESOPHAGEAL BANDING;  Surgeon: Lanelle Bal, DO;  Location: [facility];  Service: Endoscopy;  Laterality: N/A;   ESOPHAGEAL BANDING  09/24/2022   Procedure: ESOPHAGEAL BANDING;  Surgeon: Corbin Ade, MD;  [facility]: [facility];  Service: Endoscopy;;   ESOPHAGEAL BANDING  02/22/2023   Procedure: ESOPHAGEAL BANDING;  Surgeon: Dolores Frame, MD;  [facility]: [facility];  Service: Gastroenterology;;   ESOPHAGOGASTRODUODENOSCOPY (EGD) WITH PROPOFOL N/A 08/26/2022   Procedure: ESOPHAGOGASTRODUODENOSCOPY (EGD) WITH PROPOFOL;  Surgeon: Lanelle Bal, DO;  [facility]: [facility];  Service: Endoscopy;  Laterality: N/A;   ESOPHAGOGASTRODUODENOSCOPY (EGD) WITH PROPOFOL N/A 09/24/2022   Procedure: ESOPHAGOGASTRODUODENOSCOPY (EGD) WITH PROPOFOL;  Surgeon: Corbin Ade, MD;  [facility]: [facility];  Service: Endoscopy;  Laterality: N/A;  hematemesis, esophageal varices, melena   ESOPHAGOGASTRODUODENOSCOPY (EGD) WITH PROPOFOL N/A 02/22/2023   Procedure: ESOPHAGOGASTRODUODENOSCOPY (EGD) WITH PROPOFOL;  Surgeon: Dolores Frame, MD;  [facility]: [facility];  Service: Gastroenterology;  Laterality: N/A;   MULTIPLE TOOTH EXTRACTIONS     POLYPECTOMY  04/05/2022   Procedure: POLYPECTOMY;  Surgeon: Lanelle Bal, DO;   [facility]: [facility];  Service: Endoscopy;;   POSTERIOR CERVICAL LAMINECTOMY  04/21/2012   Procedure: POSTERIOR CERVICAL LAMINECTOMY;  Surgeon: Mariam Dollar, MD;  Location: [facility];  Service: Neurosurgery;  Laterality: Left;  Posterior Cervical laminectomy/foraminotomy and diskectomy, left cervical seven-thoracic one   ROTATOR CUFF REPAIR     THORACIC DISCECTOMY Left 04/03/2014   Procedure: Left Thoracic Seven to Eight, Thoracic Eight to Nine Thoracic Discectomy ;  Surgeon: Mariam Dollar, MD;  Location: [facility];  Service: Neurosurgery;  Laterality: Left;    Family History  Problem Relation Age of Onset   Diabetes Father    Cancer - Lung Father    Hypertension Other    Cancer - Lung Other     Social History   Socioeconomic History   Marital status: Divorced    Spouse name: Not on file   Number of children: Not on file   Years of education: Not on file   Highest education level: Not on file  Occupational History    Comment: Truck to storage  Tobacco Use   Smoking status: Every Day    Current packs/day: 0.00    Average packs/day: 0.3 packs/day for 40.0 years (10.0 ttl pk-yrs)    Types: Cigarettes    Start date: 05/09/1982    Last attempt to quit: 05/09/2022    Years since quitting: 1.0   Smokeless tobacco: Never   Tobacco comments:    pt trying to quit on his own  Vaping Use   Vaping status: Former  Substance and Sexual Activity   Alcohol use: Not Currently    Alcohol/week: 12.0 standard drinks of alcohol    Types: 12 Cans of beer per week    Comment: last drink 2 beers on Sunday 02/20/2023   Drug use: No   Sexual activity: Yes    Birth control/protection: None  Other Topics Concern   Not on file  Social History Narrative   Not on file   Social Determinants of Health   Financial Resource Strain: Low Risk  (03/02/2023)   Overall Financial Resource Strain (CARDIA)    Difficulty of Paying Living Expenses: Not very hard  Food Insecurity: No Food Insecurity  (05/18/2023)   Hunger Vital Sign    Worried About Running Out of Food in the Last Year: Never true    Ran Out  of Food in the Last Year: Never true  Transportation Needs: No Transportation Needs (05/18/2023)   PRAPARE - Administrator, Civil Service (Medical): No    Lack of Transportation (Non-Medical): No  Physical Activity: Not on file  Stress: Stress Concern Present (10/07/2022)   [employer] of Occupational Health - Occupational Stress Questionnaire    Feeling of Stress : To some extent  Social Connections: Not on file  Intimate Partner Violence: Not At Risk (05/18/2023)   Humiliation, Afraid, Rape, and Kick questionnaire    Fear of Current or Ex-Partner: No    Emotionally Abused: No    Physically Abused: No    Sexually Abused: No     Review of System:   General: Negative for anorexia, weight loss, fever, chills, fatigue,+ weakness. Eyes: Negative for vision changes.  ENT: Negative for hoarseness, difficulty swallowing , nasal congestion. CV: Negative for chest pain, angina, palpitations, dyspnea on exertion, +peripheral edema.  Respiratory: Negative for dyspnea at rest, dyspnea on exertion, cough, sputum, wheezing. Feels like stomach distention events deep breath GI: See history of present illness. GU:  Negative for dysuria, hematuria, urinary incontinence, urinary frequency, nocturnal urination.  MS: Negative for joint pain, low back pain.  Derm: Negative for rash or itching.  Neuro: Negative for weakness, abnormal sensation, seizure, frequent headaches, memory loss, confusion.  Psych: Negative for anxiety, depression, suicidal ideation, hallucinations.  Endo: Negative for unusual weight change.  Heme: Negative for bruising or bleeding. Allergy: Negative for rash or hives.      Physical Examination:   Vital signs in last 24 hours: Temp:  [98.1 F (36.7 C)-99.1 F (37.3 C)] 98.1 F (36.7 C) (11/14 0400) Pulse Rate:  [89-105] 101 (11/14 0720) Resp:   [10-23] 17 (11/14 0720) BP: (95-123)/(50-79) 95/50 (11/14 0720) SpO2:  [89 %-100 %] 89 % (11/14 0720) Weight:  [85.3 kg] 85.3 kg (11/13 1857) Last BM Date : 05/18/23  General: Well-nourished, well-developed in no acute distress.  Head: Normocephalic, atraumatic.   Eyes: Conjunctiva pink, no icterus. Mouth: Oropharyngeal mucosa moist and pink , no lesions erythema or exudate. Neck: Supple without thyromegaly, masses, or lymphadenopathy.  Lungs: Clear to auscultation bilaterally. Decreased breath sounds in the bases Heart: Regular rate and rhythm, no murmurs rubs or gallops.  Abdomen: Bowel sounds are normal, nontender, no hepatosplenomegaly or masses, no abdominal bruits or hernia , no rebound or guarding.  Disdented but not tense Rectal: not performed Extremities: No lower extremity edema, clubbing, deformity.  Neuro: Alert and oriented x 4 , grossly normal neurologically.  Skin: Warm and dry, no rash or jaundice.   Psych: Alert and cooperative, normal mood and affect.        Intake/Output from previous day: 11/13 0701 - 11/14 0700 In: 611.8 [I.V.:261.8; IV Piggyback:350] Out: -  Intake/Output this shift: No intake/output data recorded.  Lab Results:   CBC Recent Labs    05/18/23 1944 05/19/23 0415  WBC 9.2 10.4  HGB 9.5* 8.4*  HCT 25.5* 24.7*  MCV 111.8* 110.8*  PLT 94* 74*   BMET Recent Labs    05/18/23 1944 05/19/23 0415  NA 129* 132*  K 2.9* 3.4*  CL 86* 90*  CO2 32 30  GLUCOSE 163* 106*  BUN 11 13  CREATININE 0.79 0.73  CALCIUM 8.1* 7.8*   LFT Recent Labs    05/18/23 1944  BILITOT 17.8*  ALKPHOS 139*  AST 116*  ALT 40  PROT 5.8*  ALBUMIN 2.4*    Lipase No  results for input(s): "LIPASE" in the last 72 hours.  PT/INR Recent Labs    05/18/23 1944  LABPROT 31.8*  INR 3.1*     Hepatitis Panel No results for input(s): "HEPBSAG", "HCVAB", "HEPAIGM", "HEPBIGM" in the last 72 hours.   Imaging Studies:   DG Chest Portable 1 View  Result  Date: 05/18/2023 CLINICAL DATA:  Shortness of breath EXAM: PORTABLE CHEST 1 VIEW COMPARISON:  04/02/2023 FINDINGS: Moderate left-sided effusion is increased compared to prior. Probable small right effusion. Airspace disease at the left base. Stable cardiomediastinal silhouette. Underlying emphysema and bronchitic changes. IMPRESSION: Moderate left-sided effusion, increased compared to prior. Probable small right effusion. Airspace disease at the left base, probable atelectasis. Emphysema Electronically Signed   By: Jasmine Pang M.D.   On: 05/18/2023 21:08  [4 week]  Assessment:   57 y/o male with history of alcohol abuse, alcoholic hepatitis, decompensated etoh cirrhosis complicated by portal HTN, ascites, hepatic encephalopathy, esophageal varices status post multiple bleeds with subsequent EVL, hypotension, sleep apnea, diabetes, GERD, hypertension, IDA presented to the emergency department with 3 to 4-day history of nausea with nonbloody emesis, abdominal bloating, blood in the stool which started the morning of presentation.GI consulted for GI bleeding.  GI bleeding: history of esophageal variceal bleeding in the past, last EGD February 22, 2023 with 3 columns of grade 2 esophageal varices with stigmata of recent bleeding status post banding x 3.  Portal hypertensive gastropathy.  No gastric varices.  He was on the schedule for repeat EGD in the next several days.  Presenting now with GI bleeding, would need to consider possibility of rapid upper GI bleeding from esophageal varices.  Last colonoscopy 2 with fair prep 1 year ago.  Will start him on Rocephin 1 g every 24 hours for SBP prophylaxis, he received first dose yesterday.  Patient is on octreotide and IV PPI twice daily.  ETOH hepatitis: DF of 104.3 using PT control of 13. With prior noncompliance, prednisolone has been held in the past. Monitor for now.  Cirrhosis: decompensated with ongoing alcohol use.  MELD 3.0 of 32.  INR over 3.  Denies  blood thinners.  Diuretics have been reduced due to hypotension issues as outlined.  Carvedilol has been on hold as well.  Fortunately has very little lower extremity edema, last paracentesis 6 weeks ago.  Recent hepatic encephalopathy. Per [facility] transplant team, patient currently not a liver transplant candidate due to ongoing etoh use despite recurrent esophageal variceal bleeding, follows with them in 07/2023.   HCC screening, CT abdomen and pelvis August 2024 with no hepatoma.  Last AFP over 10, 01/2023 ordered by [facility].   Plan:   EGD with esophageal variceal banding this admission once stable, recheck INR now. Continue octreotide, PPI. Add Rocephin 1g daily. Check INR now, patient received vit K SQ 5mg  yesterday. Daily CMET, CBC, PT/INR. Paracentesis this admission, ideally after GI bleeding addressed.  Complete etoh cessation advised.    LOS: 1 day   We would like to thank you for the opportunity to participate in the care of IMRE GUERRIERI.  Leanna Battles. Dixon Boos [facility] [phone] 11/14/20247:52 AM  Addendum: discussed with Dr. Tasia Catchings today. Given no current active GI bleeding, we will hold off on EGD due to INR of 3. Plan for vit K 10mg  IV daily for 3 days. Reassess in AM for possible EGD, if INR < 2.5. If overtly bleeding, would consider EGD regardless of INR.   Leanna Battles. Melvyn Neth,  [facility] [phone] 11/14/20244:05 PM

## 2023-05-20 ENCOUNTER — Inpatient Hospital Stay (HOSPITAL_COMMUNITY): Payer: BC Managed Care – PPO

## 2023-05-20 ENCOUNTER — Encounter (HOSPITAL_COMMUNITY)
Admission: EM | Disposition: A | Discharge status: Home / Self Care | Payer: Self-pay | Source: Home / Self Care | Attending: Family Medicine

## 2023-05-20 ENCOUNTER — Inpatient Hospital Stay (HOSPITAL_COMMUNITY): Payer: BC Managed Care – PPO | Admitting: Anesthesiology

## 2023-05-20 ENCOUNTER — Encounter (HOSPITAL_COMMUNITY): Payer: Self-pay | Admitting: Internal Medicine

## 2023-05-20 ENCOUNTER — Ambulatory Visit (HOSPITAL_COMMUNITY): Admission: RE | Admit: 2023-05-20 | Payer: BC Managed Care – PPO | Source: Ambulatory Visit

## 2023-05-20 ENCOUNTER — Encounter (HOSPITAL_COMMUNITY)
Admission: RE | Admit: 2023-05-20 | Discharge: 2023-05-20 | Disposition: A | Discharge status: Home / Self Care | Payer: BC Managed Care – PPO | Source: Ambulatory Visit | Attending: Internal Medicine | Admitting: Internal Medicine

## 2023-05-20 DIAGNOSIS — K7011 Alcoholic hepatitis with ascites: Secondary | ICD-10-CM | POA: Diagnosis not present

## 2023-05-20 DIAGNOSIS — K3189 Other diseases of stomach and duodenum: Secondary | ICD-10-CM

## 2023-05-20 DIAGNOSIS — I8500 Esophageal varices without bleeding: Secondary | ICD-10-CM | POA: Diagnosis not present

## 2023-05-20 DIAGNOSIS — K922 Gastrointestinal hemorrhage, unspecified: Secondary | ICD-10-CM | POA: Diagnosis not present

## 2023-05-20 DIAGNOSIS — Z01812 Encounter for preprocedural laboratory examination: Secondary | ICD-10-CM | POA: Diagnosis present

## 2023-05-20 DIAGNOSIS — J9601 Acute respiratory failure with hypoxia: Secondary | ICD-10-CM | POA: Diagnosis not present

## 2023-05-20 DIAGNOSIS — E1165 Type 2 diabetes mellitus with hyperglycemia: Secondary | ICD-10-CM | POA: Diagnosis not present

## 2023-05-20 DIAGNOSIS — K7031 Alcoholic cirrhosis of liver with ascites: Secondary | ICD-10-CM | POA: Diagnosis not present

## 2023-05-20 DIAGNOSIS — K766 Portal hypertension: Secondary | ICD-10-CM | POA: Diagnosis not present

## 2023-05-20 DIAGNOSIS — K7682 Hepatic encephalopathy: Secondary | ICD-10-CM

## 2023-05-20 DIAGNOSIS — E871 Hypo-osmolality and hyponatremia: Secondary | ICD-10-CM | POA: Diagnosis not present

## 2023-05-20 HISTORY — PX: ESOPHAGOGASTRODUODENOSCOPY (EGD) WITH PROPOFOL: SHX5813

## 2023-05-20 HISTORY — PX: ESOPHAGEAL BANDING: SHX5518

## 2023-05-20 LAB — CBC WITH DIFFERENTIAL/PLATELET
Abs Immature Granulocytes: 0 10*3/uL (ref 0.00–0.07)
Basophils Absolute: 0 10*3/uL (ref 0.0–0.1)
Basophils Relative: 0 %
Eosinophils Absolute: 1.4 10*3/uL — ABNORMAL HIGH (ref 0.0–0.5)
Eosinophils Relative: 11 %
HCT: 24.1 % — ABNORMAL LOW (ref 39.0–52.0)
Hemoglobin: 8.2 g/dL — ABNORMAL LOW (ref 13.0–17.0)
Lymphocytes Relative: 10 %
Lymphs Abs: 1.3 10*3/uL (ref 0.7–4.0)
MCH: 38.1 pg — ABNORMAL HIGH (ref 26.0–34.0)
MCHC: 34 g/dL (ref 30.0–36.0)
MCV: 112.1 fL — ABNORMAL HIGH (ref 80.0–100.0)
Monocytes Absolute: 1 10*3/uL (ref 0.1–1.0)
Monocytes Relative: 8 %
Neutro Abs: 9.1 10*3/uL — ABNORMAL HIGH (ref 1.7–7.7)
Neutrophils Relative %: 71 %
Platelets: 94 10*3/uL — ABNORMAL LOW (ref 150–400)
RBC: 2.15 MIL/uL — ABNORMAL LOW (ref 4.22–5.81)
RDW: 15.9 % — ABNORMAL HIGH (ref 11.5–15.5)
WBC: 12.8 10*3/uL — ABNORMAL HIGH (ref 4.0–10.5)
nRBC: 0 % (ref 0.0–0.2)

## 2023-05-20 LAB — COMPREHENSIVE METABOLIC PANEL
ALT: 36 U/L (ref 0–44)
AST: 95 U/L — ABNORMAL HIGH (ref 15–41)
Albumin: 2.1 g/dL — ABNORMAL LOW (ref 3.5–5.0)
Alkaline Phosphatase: 115 U/L (ref 38–126)
Anion gap: 10 (ref 5–15)
BUN: 14 mg/dL (ref 6–20)
CO2: 29 mmol/L (ref 22–32)
Calcium: 7.9 mg/dL — ABNORMAL LOW (ref 8.9–10.3)
Chloride: 88 mmol/L — ABNORMAL LOW (ref 98–111)
Creatinine, Ser: 0.78 mg/dL (ref 0.61–1.24)
GFR, Estimated: >60 mL/min (ref >60)
Glucose, Bld: 127 mg/dL — ABNORMAL HIGH (ref 70–99)
Potassium: 3.7 mmol/L (ref 3.5–5.1)
Sodium: 127 mmol/L — ABNORMAL LOW (ref 135–145)
Total Bilirubin: 17.4 mg/dL — ABNORMAL HIGH (ref <1.2)
Total Protein: 5.4 g/dL — ABNORMAL LOW (ref 6.5–8.1)

## 2023-05-20 LAB — GRAM STAIN

## 2023-05-20 LAB — BODY FLUID CELL COUNT WITH DIFFERENTIAL
Eos, Fluid: 1 %
Eos, Fluid: 0 %
Lymphs, Fluid: 20 %
Lymphs, Fluid: 47 %
Monocyte-Macrophage-Serous Fluid: 34 % — ABNORMAL LOW (ref 50–90)
Monocyte-Macrophage-Serous Fluid: 47 % — ABNORMAL LOW (ref 50–90)
Neutrophil Count, Fluid: 46 % — ABNORMAL HIGH (ref 0–25)
Neutrophil Count, Fluid: 6 % (ref 0–25)
Total Nucleated Cell Count, Fluid: 285 uL (ref 0–1000)
Total Nucleated Cell Count, Fluid: 81 uL (ref 0–1000)

## 2023-05-20 LAB — PROTIME-INR
INR: 2.7 — ABNORMAL HIGH (ref 0.8–1.2)
INR: 2.4 — ABNORMAL HIGH (ref 0.8–1.2)
Prothrombin Time: 28.6 s — ABNORMAL HIGH (ref 11.4–15.2)
Prothrombin Time: 26.4 s — ABNORMAL HIGH (ref 11.4–15.2)

## 2023-05-20 LAB — LACTATE DEHYDROGENASE: LDH: 246 U/L — ABNORMAL HIGH (ref 98–192)

## 2023-05-20 LAB — GLUCOSE, CAPILLARY
Glucose-Capillary: 129 mg/dL — ABNORMAL HIGH (ref 70–99)
Glucose-Capillary: 133 mg/dL — ABNORMAL HIGH (ref 70–99)
Glucose-Capillary: 138 mg/dL — ABNORMAL HIGH (ref 70–99)
Glucose-Capillary: 161 mg/dL — ABNORMAL HIGH (ref 70–99)
Glucose-Capillary: 192 mg/dL — ABNORMAL HIGH (ref 70–99)
Glucose-Capillary: 195 mg/dL — ABNORMAL HIGH (ref 70–99)
Glucose-Capillary: 220 mg/dL — ABNORMAL HIGH (ref 70–99)
Glucose-Capillary: 96 mg/dL (ref 70–99)

## 2023-05-20 LAB — ALBUMIN, PLEURAL OR PERITONEAL FLUID: Albumin, Fluid: <1.5 g/dL

## 2023-05-20 LAB — PROTEIN, PLEURAL OR PERITONEAL FLUID
Total protein, fluid: <3 g/dL
Total protein, fluid: <3 g/dL

## 2023-05-20 LAB — LACTATE DEHYDROGENASE, PLEURAL OR PERITONEAL FLUID: LD, Fluid: 62 U/L — ABNORMAL HIGH (ref 3–23)

## 2023-05-20 SURGERY — ESOPHAGOGASTRODUODENOSCOPY (EGD) WITH PROPOFOL
Anesthesia: General

## 2023-05-20 MED ORDER — PHENYLEPHRINE 80 MCG/ML (10ML) SYRINGE FOR IV PUSH (FOR BLOOD PRESSURE SUPPORT)
PREFILLED_SYRINGE | INTRAVENOUS | Status: DC | PRN
  Administered 2023-05-20: 160 ug via INTRAVENOUS
  Administered 2023-05-20: 800 ug via INTRAVENOUS

## 2023-05-20 MED ORDER — LACTATED RINGERS IV SOLN: INTRAVENOUS | Status: DC

## 2023-05-20 MED ORDER — LIDOCAINE HCL (CARDIAC) PF 100 MG/5ML IV SOSY
PREFILLED_SYRINGE | INTRAVENOUS | Status: DC | PRN
  Administered 2023-05-20: 60 mg via INTRAVENOUS

## 2023-05-20 MED ORDER — PROPOFOL 10 MG/ML IV BOLUS
INTRAVENOUS | Status: DC | PRN
  Administered 2023-05-20: 20 mg via INTRAVENOUS
  Administered 2023-05-20: 60 mg via INTRAVENOUS
  Administered 2023-05-20: 40 mg via INTRAVENOUS
  Administered 2023-05-20: 30 mg via INTRAVENOUS

## 2023-05-20 MED ORDER — LIDOCAINE HCL (PF) 2 % IJ SOLN
INTRAMUSCULAR | Status: AC
Start: 2023-05-20 — End: ?
  Filled 2023-05-20: qty 5

## 2023-05-20 MED ORDER — ALBUMIN HUMAN 25 % IV SOLN
12.5000 g | Freq: Once | INTRAVENOUS | Status: AC
  Administered 2023-05-20: 12.5 g via INTRAVENOUS
  Filled 2023-05-20: qty 50

## 2023-05-20 MED ORDER — LACTATED RINGERS IV SOLN: INTRAVENOUS | Status: DC | PRN

## 2023-05-20 MED ORDER — LIDOCAINE HCL (PF) 2 % IJ SOLN
10.0000 mL | Freq: Once | INTRAMUSCULAR | Status: AC
  Administered 2023-05-20: 10 mL
  Filled 2023-05-20: qty 10

## 2023-05-20 MED ORDER — PROPOFOL 500 MG/50ML IV EMUL
INTRAVENOUS | Status: DC | PRN
  Administered 2023-05-20: 100 ug/kg/min via INTRAVENOUS

## 2023-05-20 MED ORDER — IPRATROPIUM-ALBUTEROL 0.5-2.5 (3) MG/3ML IN SOLN
3.0000 mL | RESPIRATORY_TRACT | Status: DC | PRN
  Administered 2023-05-20: 3 mL via RESPIRATORY_TRACT
  Filled 2023-05-20: qty 3

## 2023-05-20 MED ORDER — PROPOFOL 500 MG/50ML IV EMUL
INTRAVENOUS | Status: AC
  Filled 2023-05-20: qty 50

## 2023-05-20 NOTE — Progress Notes (Signed)
Patient tolerated right sided diagnostic Paracentesis for labs only well today and 120 mL of clear yellow ascites removed and taken to lab for processing. Patient verbalized understanding of post procedure instructions and repositioned back into bed comfortably with no acute distress at completion of procedure. Report given to Victorino Dike, RN assignment to patient.

## 2023-05-20 NOTE — Anesthesia Preprocedure Evaluation (Addendum)
Anesthesia Evaluation  Patient identified by MRN, date of birth, ID band Patient awake    Reviewed: Allergy & Precautions, H&P , NPO status , Patient's Chart, lab work & pertinent test results, reviewed documented beta blocker date and time   Airway Mallampati: II  TM Distance: >3 FB Neck ROM: full    Dental no notable dental hx. (+) Dental Advisory Given   Pulmonary sleep apnea , Current Smoker and Patient abstained from smoking.   Pulmonary exam normal breath sounds clear to auscultation       Cardiovascular Exercise Tolerance: Good hypertension, Normal cardiovascular exam Rhythm:regular Rate:Normal     Neuro/Psych  PSYCHIATRIC DISORDERS Anxiety Depression    negative neurological ROS  negative psych ROS   GI/Hepatic negative GI ROS,GERD  ,,(+) Cirrhosis   Esophageal Varices    Jaundice   Endo/Other  diabetes    Renal/GU negative Renal ROS  negative genitourinary   Musculoskeletal  (+) Arthritis , Osteoarthritis,    Abdominal   Peds  Hematology negative hematology ROS (+) Blood dyscrasia, anemia   Anesthesia Other Findings   Reproductive/Obstetrics negative OB ROS                             Anesthesia Physical Anesthesia Plan  ASA: 4 and emergent  Anesthesia Plan: General and General ETT   Post-op Pain Management: Minimal or no pain anticipated   Induction: Intravenous  PONV Risk Score and Plan:   Airway Management Planned: Nasal Cannula, Natural Airway and Simple Face Mask  Additional Equipment: None  Intra-op Plan:   Post-operative Plan:   Informed Consent: I have reviewed the patients History and Physical, chart, labs and discussed the procedure including the risks, benefits and alternatives for the proposed anesthesia with the patient or authorized representative who has indicated his/her understanding and acceptance.     Dental Advisory Given  Plan Discussed  with: CRNA  Anesthesia Plan Comments:        Anesthesia Quick Evaluation

## 2023-05-20 NOTE — Anesthesia Postprocedure Evaluation (Signed)
Anesthesia Post Note  Patient: Shawn Velazquez  Procedure(s) Performed: ESOPHAGOGASTRODUODENOSCOPY (EGD) WITH PROPOFOL ESOPHAGEAL BANDING  Patient location during evaluation: PACU Anesthesia Type: General Level of consciousness: awake and alert Pain management: pain level controlled Vital Signs Assessment: post-procedure vital signs reviewed and stable Respiratory status: spontaneous breathing, nonlabored ventilation, respiratory function stable and patient connected to nasal cannula oxygen Cardiovascular status: blood pressure returned to baseline and stable Postop Assessment: no apparent nausea or vomiting Anesthetic complications: no   There were no known notable events for this encounter.   Last Vitals:  Vitals:   05/20/23 1445 05/20/23 1500  BP:  (!) 98/56  Pulse: 70 78  Resp: 17 17  Temp:    SpO2: 94% (!) 85%    Last Pain:  Vitals:   05/20/23 1421  TempSrc:   PainSc: Asleep                 Yuritzi Kamp L Romayne Ticas

## 2023-05-20 NOTE — Plan of Care (Signed)

## 2023-05-20 NOTE — Transfer of Care (Addendum)
Immediate Anesthesia Transfer of Care Note  Patient: Shawn Velazquez  Procedure(s) Performed: ESOPHAGOGASTRODUODENOSCOPY (EGD) WITH PROPOFOL ESOPHAGEAL BANDING  Patient Location: PACU  Anesthesia Type:General  Level of Consciousness: drowsy and patient cooperative  Airway & Oxygen Therapy: Patient Spontanous Breathing and Patient connected to face mask oxygen  Post-op Assessment: Report given to RN and Post -op Vital signs reviewed and stable  Post vital signs: Reviewed and stable  Last Vitals:  Vitals Value Taken Time  BP 110/77 05/20/23 1421  Temp    Pulse 63 05/20/23 1427  Resp 15 05/20/23 1427  SpO2 99 % 05/20/23 1427  Vitals shown include unfiled device data.  Last Pain:  Vitals:   05/20/23 1356  TempSrc:   PainSc: 0-No pain         Complications: No notable events documented.

## 2023-05-20 NOTE — Op Note (Signed)
[facility] Patient Name: Shawn Velazquez Procedure Date: 05/20/2023 1:28 PM MRN: [id number] Date of Birth: June 30, 1966 Attending MD: Hennie Duos. Marletta Lor , [place], [id number] CSN: [id number] Age: 57 Admit Type: Inpatient Procedure:                Upper GI endoscopy Indications:              Acute post hemorrhagic anemia, Hematochezia Providers:                Hennie Duos. Marletta Lor, DO, Tammy Vaught, RN, Zena Amos Referring MD:              Medicines:                See the Anesthesia note for documentation of the                            administered medications Complications:            No immediate complications. Estimated Blood Loss:     Estimated blood loss was minimal. Estimated blood                            loss: none. Procedure:                Pre-Anesthesia Assessment:                           - The anesthesia plan was to use monitored                            anesthesia care (MAC).                           After obtaining informed consent, the endoscope was                            passed under direct vision. Throughout the                            procedure, the patient's blood pressure, pulse, and                            oxygen saturations were monitored continuously. The                            [id number]-[id number] ([id number]) scope was introduced through the                            mouth, and advanced to the second part of duodenum.                            The upper GI endoscopy was accomplished without                            difficulty. The patient tolerated the  procedure                            well. Scope In: 2:02:42 PM Scope Out: 2:16:12 PM Total Procedure Duration: 0 hours 13 minutes 30 seconds  Findings:      Three columns of grade I varices with no bleeding and no stigmata of       recent bleeding were found in the lower third of the esophagus. No red       wale signs were present. Scarring from prior treatment  was visible.       These appeared innocent, successfully eradicated from prior EBL One       grade II varix noted, also innocent appearing. EBL attempted but       unsuccessful. Band deployed but eventually fell off. No bleeding during       or after procedure.      Moderate portal hypertensive gastropathy was found in the entire       examined stomach.      The duodenal bulb, first portion of the duodenum and second portion of       the duodenum were normal. Impression:               - Grade I esophageal varices with no bleeding and                            no stigmata of recent bleeding.                           - Portal hypertensive gastropathy.                           - Normal duodenal bulb, first portion of the                            duodenum and second portion of the duodenum.                           - No specimens collected. Moderate Sedation:      Per Anesthesia Care Recommendation:           - Return patient to hospital ward for ongoing care.                           - Soft diet.                           - Continue to monitor H&H, transfuse for <7.                           - IV Ceftriaxone x5 days.                           - PPI BID, okay to switch to oral                           - Absolute alcohol cessation Procedure Code(s):        --- Professional ---  65784, Esophagogastroduodenoscopy, flexible,                            transoral; diagnostic, including collection of                            specimen(s) by brushing or washing, when performed                            (separate procedure) Diagnosis Code(s):        --- Professional ---                           I85.00, Esophageal varices without bleeding                           K76.6, Portal hypertension                           K31.89, Other diseases of stomach and duodenum                           D62, Acute posthemorrhagic anemia                           K92.1, Melena  (includes Hematochezia) CPT copyright 2022 American [employer] Association. All rights reserved. The codes documented in this report are preliminary and upon coder review may  be revised to meet current compliance requirements. Hennie Duos. Marletta Lor, DO Hennie Duos. Marletta Lor, DO 05/20/2023 2:24:44 PM This report has been signed electronically. Number of Addenda: 0

## 2023-05-20 NOTE — Procedures (Signed)
PROCEDURE SUMMARY:  Successful ultrasound guided paracentesis from the right lower quadrant.  Yielded 100 ml of clear yellow fluid.  No immediate complications.  The patient tolerated the procedure well.   Specimen sent for labs.  EBL < 2 mL  The patient has previously been formally evaluated by the [facility] Inter[facility] Radiology Portal Hypertension [facility] and is being actively followed for potential future intervention. ** Pending clinic evaluation to discuss TIPS  Alwyn Ren, AGACNP-BC [phone] 05/20/2023, 12:16 PM

## 2023-05-20 NOTE — Progress Notes (Signed)
Gastroenterology Progress Note   Referring Provider: No ref. provider found Primary Care Physician:  [name], MD Primary Gastroenterologist:  Dr.  Patient ID: Shawn Velazquez; [id number]; 1965/09/23    Subjective   He reports 3 twelve ounce beers since Friday last week. Reported brbpr and some maroon colored stool the other day when he went to urinate. Denies any N/V. Has had significant ascites development with shortness of breath but minimal peripheral edema. Had some mild hypoxia overnight requiring 2-3lpm [place].   Objective   Vital signs in last 24 hours Temp:  [97.6 F (36.4 C)-100.8 F (38.2 C)] 100.8 F (38.2 C) (11/15 0900) Pulse Rate:  [90-97] 93 (11/15 0900) Resp:  [11-22] 22 (11/15 0900) BP: (97-123)/(47-80) 112/64 (11/15 0900) SpO2:  [91 %-99 %] 99 % (11/15 0900) Last BM Date : 05/18/23  Physical Exam General:   Alert and oriented, fatigued. Ill appearing.  Head:  Normocephalic and atraumatic. Eyes:  Scleral icterus Neck:  Supple, without thyromegaly or masses.  Heart:  S1, S2 present, no murmurs noted.  Lungs: tachypneic, shallow breaths, increased WOB. Mild wheezing, diminished breath sounds L>R.  Abdomen:  Bowel sounds present, soft. Distended and fluid filled.  non tender. Umbilical hernia noted. . No rebound or guarding.  Msk:  Symmetrical without gross deformities. Normal posture. Pulses:  Normal pulses noted. Extremities:  + 1 pitting edema bilaterally.  Neurologic:  Alert and  oriented x4;  grossly normal neurologically. No asterixis.  Skin:  Warm and dry, intact without significant lesions.  Psych:  Alert and cooperative. Normal mood and affect.  Intake/Output from previous day: 11/14 0701 - 11/15 0700 In: 752.8 [I.V.:517.9; IV Piggyback:234.9] Out: 475 [Urine:475] Intake/Output this shift: No intake/output data recorded.  Lab Results  Recent Labs    05/18/23 1944 05/19/23 0415 05/20/23 0501  WBC 9.2 10.4 12.8*  HGB 9.5* 8.4* 8.2*   HCT 25.5* 24.7* 24.1*  PLT 94* 74* 94*   BMET Recent Labs    05/18/23 1944 05/19/23 0415 05/20/23 0501  NA 129* 132* 127*  K 2.9* 3.4* 3.7  CL 86* 90* 88*  CO2 32 30 29  GLUCOSE 163* 106* 127*  BUN 11 13 14   CREATININE 0.79 0.73 0.78  CALCIUM 8.1* 7.8* 7.9*   LFT Recent Labs    05/18/23 1944 05/19/23 0415 05/20/23 0501  PROT 5.8* 4.9* 5.4*  ALBUMIN 2.4* 2.0* 2.1*  AST 116* 98* 95*  ALT 40 37 36  ALKPHOS 139* 111 115  BILITOT 17.8* 16.3* 17.4*   PT/INR Recent Labs    05/19/23 1016 05/20/23 0501  LABPROT 31.1* 28.6*  INR 3.0* 2.7*   Hepatitis Panel No results for input(s): "HEPBSAG", "HCVAB", "HEPAIGM", "HEPBIGM" in the last 72 hours.  Studies/Results DG Chest Portable 1 View  Result Date: 05/18/2023 CLINICAL DATA:  Shortness of breath EXAM: PORTABLE CHEST 1 VIEW COMPARISON:  04/02/2023 FINDINGS: Moderate left-sided effusion is increased compared to prior. Probable small right effusion. Airspace disease at the left base. Stable cardiomediastinal silhouette. Underlying emphysema and bronchitic changes. IMPRESSION: Moderate left-sided effusion, increased compared to prior. Probable small right effusion. Airspace disease at the left base, probable atelectasis. Emphysema Electronically Signed   By: Jasmine Pang M.D.   On: 05/18/2023 21:08    Assessment  57 y.o. male with a history of alcohol abuse, alcoholic hepatitis, decompensated EtOH cirrhosis C/B portal HTN, ascites, EGD, and varices s/p multiple bleeds with EBL, hypertension, sleep apnea, diabetes, GERD, HTN, and IDA who presented to the ED  with 3-4-day history of nausea with nonbloody emesis, abdominal distention, and hematochezia.  GI consulted for concern for GI bleeding.   GI bleed, hematochezia: History of esophageal variceal bleeding with last EGD in August with 3 columns of grade 2 esophageal varices with stigmata of recent bleeding s/p banding x 3 as well as PHG.  He was on outpatient schedule for EGD for  surveillance within the next several days but presented with hematochezia concerning for possible rapid upper GI bleed.  Last colonoscopy in October 2023 with fair prep.  He was started on IV PPI twice daily and he has been given octreotide as well as Rocephin 1 g every 24H for SBP prophylaxis.  He developed low-grade temperature at 38.2C/100.79F this morning and given ascites he underwent diagnostic paracentesis given he was also in need of thoracentesis and has been having some mild hypotension.  Repeating INR early afternoon and pending results will undergo EGD this afternoon.  EtoH hepatitis: DF calculated yesterday 104.3 using PT control 13.  Given his noncompliance in the past, steroids are on hold and will continue to monitor.  Decompensated cirrhosis: MELD elevated at 31/32 this admission.  Previous INR over 3.  INR this morning at 2.7 but did receive a dose of vitamin K this morning therefore we are rechecking at noon.  Diuretics have been on hold given hypotension.  Beta-blocker also on hold.  Has very mild lower extremity edema but significant ascites and has dealt with recurrent hepatic encephalopathy for which she is maintained on Xifaxan and lactulose.  He has seen [facility] liver transplant team and currently deemed to not be a liver transplant candidate given ongoing EtOH use.  Has follow-up with them scheduled in January.  Had [facility] screening in August 2024 without hepatoma.  aFP was elevated at 10 in July, repeat is pending.  Plan / Recommendations  Repeat INR early afternoon Diagnostic bedside para today.  CBC, CMP, INR tomorrow PPI IV BID and octreotide infusion Continue Vitamin K 10 mg today and tomorrow Continue midodrine, hold BB If remains hemodynamically stable can consider resuming low dose diuretics tomorrow.  Continue Xifaxin 550 mg BID Titrate lactulose for 2-3 BM daily Continue rocephin 1g daily for SBP prophylaxis NPO Possible EGD for variceal banding this afternoon pending  INR   Update: Repeat INR came back at 2.4.  Given this and tolerating diagnostic paracentesis with stable vitals we will undergo upper endoscopy this afternoon.   LOS: 2 days    05/20/2023, 10:01 AM   Brooke Bonito, MSN, FNP-BC, AGACNP-BC [facility]

## 2023-05-20 NOTE — Progress Notes (Signed)
PROGRESS NOTE  Shawn Velazquez, is a 57 y.o. male, DOB - [birth year], ZYS:[id number]  Admit date - 05/18/2023   Admitting Physician Frankey Shown, DO  Outpatient Primary MD for the patient is [name], MD  LOS - 2  Chief Complaint  Patient presents with   GI Bleeding      Brief Narrative:  57 y.o. male with medical history significant of GERD, esophageal varices, alcoholic cirrhosis admitted on 05/18/2023 with concerns about GI bleed and electrolyte abnormalities    -Assessment and Plan: 1)Acute GI bleed -GI consult appreciated -INR too high to allow for endoluminal evaluation by GI team -Continue IV octreotide -Continue Protonix -EGD on 05/20/2023 shows--Grade I esophageal varices with no bleeding and          no stigmata of recent bleeding. Portal hypertensive gastropathy.- Normal duodenal bulb, first portion of the duodenum and second portion of the duodenum.  2)Hyponatremia/Hypomagnesemia/Hypokalemia--in the setting of alcohol abuse, replace and recheck electrolytes  3) acute hypoxic respiratory failure---Significant respiratory distress overnight requiring up to 6 L of oxygen with chest x-ray showing left-sided pleural effusion -On 05/20/2023 patient underwent left-sided thoracentesis with 1.5 L of blood-tinged pleural fluid removed  4)alcoholic liver cirrhosis with ascites--decompensated liver disease -Elevated LFTs noted pattern consistent with alcoholic liver disease -GI consult appreciated -Lactulose as ordered- --continue rifaximin -Rocephin for SBP prophylaxis -Patient had diagnostic paracentesis on 05/20/2023 with 100 mL removed -Plans for therapeutic para on Monday, 05/23/2023 -Patient is being evaluated for possible TIPS by IR  5) acute on chronic anemia--- Hgb down to 8.4 from a baseline usually around 10 -Monitor closely and transfuse as clinically indicated  6)Thrombocytopenia--due to underlying liver cirrhosis, -Monitor closely and transfuse as  clinically indicated  7)DM2- Use Novolog/Humalog Sliding scale insulin with Accu-Cheks/Fingersticks as ordered  - Status is: Inpatient   Disposition: The patient is from: Home              Anticipated d/c is to: Home              Anticipated d/c date is: 3 days              Patient currently is not medically stable to d/c. Barriers: Not Clinically Stable-   Code Status :  -  Code Status: Full Code   Family Communication:    NA (patient is alert, awake and coherent)   DVT Prophylaxis  :   - SCDs *  SCDs Start: 05/18/23 2248   Lab Results  Component Value Date   PLT 94 (L) 05/20/2023   Inpatient Medications Scheduled Meds:  Chlorhexidine Gluconate Cloth  6 each Topical Daily   insulin aspart  0-15 Units Subcutaneous Q4H   pantoprazole (PROTONIX) IV  40 mg Intravenous Q12H   Continuous Infusions:  cefTRIAXone (ROCEPHIN)  IV 1 g (05/20/23 1008)   octreotide (SANDOSTATIN) 500 mcg in sodium chloride 0.9 % 250 mL (2 mcg/mL) infusion 50 mcg/hr (05/20/23 1356)   phytonadione (VITAMIN K) 10 mg in dextrose 5 % 50 mL IVPB 10 mg (05/20/23 0902)   PRN Meds:.ipratropium-albuterol, prochlorperazine   Anti-infectives (From admission, onward)    Start     Dose/Rate Route Frequency Ordered Stop   05/19/23 1030  cefTRIAXone (ROCEPHIN) 1 g in sodium chloride 0.9 % 100 mL IVPB        1 g 200 mL/hr over 30 Minutes Intravenous Every 24 hours 05/19/23 0942     05/18/23 1915  cefTRIAXone (ROCEPHIN) 1 g in sodium chloride 0.9 % 100  mL IVPB        1 g 200 mL/hr over 30 Minutes Intravenous  Once 05/18/23 1911 05/18/23 2016      Subjective: Shawn Velazquez today has no fevers, no emesis,  No chest pain, No further bleeding concerns -Significant respiratory distress overnight requiring up to 6 L of oxygen with chest x-ray showing left-sided pleural effusion  Objective: Vitals:   05/20/23 1445 05/20/23 1500 05/20/23 1533 05/20/23 1627  BP: 95/68 (!) 98/56    Pulse: 70 78 78   Resp: 17 17  17    Temp:   98 F (36.7 C) 98 F (36.7 C)  TempSrc:   Oral Oral  SpO2: 94% 94% 100%   Weight:      Height:        Intake/Output Summary (Last 24 hours) at 05/20/2023 1821 Last data filed at 05/20/2023 1427 Gross per 24 hour  Intake 415.7 ml  Output 275 ml  Net 140.7 ml   Filed Weights   05/18/23 1857 05/19/23 0821  Weight: 85.3 kg 90 kg   Physical Exam Gen:- Awake Alert,  in no apparent distress  HEENT:- [place].AT,  +ve sclera icterus Nose-[place] 6 L/min Neck-Supple Neck,No JVD,.  Lungs-improved air movement on the left after thoracentesis, no wheezing  CV- S1, S2 normal, regular  Abd-  +ve B.Sounds, Abd Soft, epigastric tenderness, small umbilical hernia noted, distention/ascites noted  Extremity/Skin:-Jaundice, pedal pulses present  Psych-affect is appropriate, oriented x3 Neuro-Generalized weakness, no new focal deficits, no tremors  Data Reviewed: I have personally reviewed following labs and imaging studies  CBC: Recent Labs  Lab 05/18/23 1944 05/19/23 0415 05/20/23 0501  WBC 9.2 10.4 12.8*  NEUTROABS 6.1  --  9.1*  HGB 9.5* 8.4* 8.2*  HCT 25.5* 24.7* 24.1*  MCV 111.8* 110.8* 112.1*  PLT 94* 74* 94*   Basic Metabolic Panel: Recent Labs  Lab 05/18/23 1944 05/19/23 0415 05/20/23 0501  NA 129* 132* 127*  K 2.9* 3.4* 3.7  CL 86* 90* 88*  CO2 32 30 29  GLUCOSE 163* 106* 127*  BUN 11 13 14   CREATININE 0.79 0.73 0.78  CALCIUM 8.1* 7.8* 7.9*  MG 1.5*  --   --    GFR: Estimated Creatinine Clearance: 104.5 mL/min (by C-G formula based on SCr of 0.78 mg/dL). Liver Function Tests: Recent Labs  Lab 05/18/23 1944 05/19/23 0415 05/20/23 0501  AST 116* 98* 95*  ALT 40 37 36  ALKPHOS 139* 111 115  BILITOT 17.8* 16.3* 17.4*  PROT 5.8* 4.9* 5.4*  ALBUMIN 2.4* 2.0* 2.1*   Recent Results (from the past 240 hour(s))  MRSA Next Gen by PCR, Nasal     Status: None   Collection Time: 05/19/23 12:13 AM   Specimen: Nasal Mucosa; Nasal Swab  Result Value Ref Range  Status   MRSA by PCR Next Gen NOT DETECTED NOT DETECTED Final    Comment: (NOTE) The GeneXpert MRSA Assay (FDA approved for NASAL specimens only), is one component of a comprehensive MRSA colonization surveillance program. It is not intended to diagnose MRSA infection nor to guide or monitor treatment for MRSA infections. Test performance is not FDA approved in patients less than 33 years old. Performed at [facility], [address], [place], [place] [address]   Gram stain     Status: None   Collection Time: 05/20/23 11:32 AM   Specimen: Pleura  Result Value Ref Range Status   Specimen Description PLEURAL LEFT  Final   Special Requests NONE  Final  Gram Stain   Final    WBC PRESENT,BOTH PMN AND MONONUCLEAR NO ORGANISMS SEEN CYTOSPIN SMEAR Performed at [facility], [address] [place], [place] [address]    Report Status 05/20/2023 FINAL  Final  Gram stain     Status: None   Collection Time: 05/20/23 11:45 AM   Specimen: Ascitic  Result Value Ref Range Status   Specimen Description ASCITIC  Final   Special Requests ASCITIC  Final   Gram Stain   Final    WBC PRESENT,BOTH PMN AND MONONUCLEAR NO ORGANISMS SEEN CYTOSPIN SMEAR Performed at [facility], [address] [place], [place] [address]    Report Status 05/20/2023 FINAL  Final    Radiology Studies: US Paracentesis  Result Date: 05/20/2023 INDICATION: Patient with a history of alcoholic cirrhosis with recurrent ascites. Diagnostic paracentesis requested. EXAM: ULTRASOUND GUIDED PARACENTESIS MEDICATIONS: 1% lidocaine 10 ml. COMPLICATIONS: None immediate. PROCEDURE: Informed written consent was obtained from the patient after a discussion of the risks, benefits and alternatives to treatment. A timeout was performed prior to the initiation of the procedure. Initial ultrasound scanning demonstrates a large amount of ascites within the right lower abdominal quadrant. The right lower abdomen was prepped and draped in the  usual sterile fashion. 1% lidocaine was used for local anesthesia. Following this, a 19 gauge, 7-cm, Yueh catheter was introduced. An ultrasound image was saved for documentation purposes. The paracentesis was performed. The catheter was removed and a dressing was applied. The patient tolerated the procedure well without immediate post procedural complication. A total of approximately 100 ml of clear yellow fluid was removed. Samples were sent to the laboratory as requested by the clinical team. IMPRESSION: Successful ultrasound-guided paracentesis yielding 100 ml of peritoneal fluid. Procedure performed by Alwyn Ren, NP PLAN: The patient has previously been formally evaluated by the [facility] Interventional Radiology Portal Hypertension Clinic and is being actively followed for potential future intervention. ** Pending clinic evaluation to discuss TIPS. Electronically Signed   By: Marliss Coots M.D.   On: 05/20/2023 12:36   DG Chest Port 1 View  Result Date: 05/20/2023 CLINICAL DATA:  57 year old male status post left thoracentesis. EXAM: PORTABLE CHEST - 1 VIEW COMPARISON:  05/18/2023 FINDINGS: The mediastinal contours are within normal limits. No cardiomegaly. No evidence of significant left pleural effusion or evidence of left pneumothorax after thoracentesis. Low lung volumes bilaterally. No new focal consolidation. No acute osseous abnormality. IMPRESSION: No evidence of significant left pleural effusion or pneumothorax after thoracentesis. Electronically Signed   By: Marliss Coots M.D.   On: 05/20/2023 12:28   US THORACENTESIS ASP PLEURAL SPACE W/IMG GUIDE  Result Date: 05/20/2023 INDICATION: Patient with a history of cirrhosis found to have left pleural effusion. Diagnostic and therapeutic thoracentesis requested. EXAM: ULTRASOUND GUIDED THORACENTESIS MEDICATIONS: 1% lidocaine 10 ml . COMPLICATIONS: None immediate. PROCEDURE: An ultrasound guided thoracentesis was thoroughly discussed with  the patient and questions answered. The benefits, risks, alternatives and complications were also discussed. The patient understands and wishes to proceed with the procedure. Written consent was obtained. Ultrasound was performed to localize and mark an adequate pocket of fluid in the left chest. The area was then prepped and draped in the normal sterile fashion. 1% Lidocaine was used for local anesthesia. Under ultrasound guidance a 6 Fr Safe-T-Centesis catheter was introduced. Thoracentesis was performed. The catheter was removed and a dressing applied. FINDINGS: A total of approximately 1.5 L of blood-tinged fluid was removed. Samples were sent to the laboratory as requested by  the clinical team. IMPRESSION: Successful ultrasound guided left thoracentesis yielding 1.5 L of pleural fluid. Procedure performed by Alwyn Ren, NP Electronically Signed   By: Marliss Coots M.D.   On: 05/20/2023 12:26   DG Chest Portable 1 View  Result Date: 05/18/2023 CLINICAL DATA:  Shortness of breath EXAM: PORTABLE CHEST 1 VIEW COMPARISON:  04/02/2023 FINDINGS: Moderate left-sided effusion is increased compared to prior. Probable small right effusion. Airspace disease at the left base. Stable cardiomediastinal silhouette. Underlying emphysema and bronchitic changes. IMPRESSION: Moderate left-sided effusion, increased compared to prior. Probable small right effusion. Airspace disease at the left base, probable atelectasis. Emphysema Electronically Signed   By: Jasmine Pang M.D.   On: 05/18/2023 21:08    Scheduled Meds:  Chlorhexidine Gluconate Cloth  6 each Topical Daily   insulin aspart  0-15 Units Subcutaneous Q4H   pantoprazole (PROTONIX) IV  40 mg Intravenous Q12H   Continuous Infusions:  cefTRIAXone (ROCEPHIN)  IV 1 g (05/20/23 1008)   octreotide (SANDOSTATIN) 500 mcg in sodium chloride 0.9 % 250 mL (2 mcg/mL) infusion 50 mcg/hr (05/20/23 1356)   phytonadione (VITAMIN K) 10 mg in dextrose 5 % 50 mL IVPB 10 mg  (05/20/23 0902)    LOS: 2 days   Shon Hale M.D on 05/20/2023 at 6:21 PM  Go to [email or web address] - for contact info  [facility] - Office  [phone]  If 7PM-7AM, please contact night-coverage [email or web address] 05/20/2023, 6:21 PM

## 2023-05-20 NOTE — Procedures (Signed)
PROCEDURE SUMMARY:  Successful US guided left thoracentesis. Yielded 1.5 L of blood-tinged fluid. Pt tolerated procedure well. No immediate complications.  Specimen sent for labs. CXR ordered; results pending  EBL < 2 mL  Mickie Kay, NP 05/20/2023 12:20 PM

## 2023-05-20 NOTE — Progress Notes (Signed)
Patient tolerated left sided thoracentesis procedure well today and 1.5 Liters of pleural fluid removed and sent to lab for processing. Post portable chest xray done at bedside at this time. Patient verbalized understanding of post procedure instructions and no acute distress noted.

## 2023-05-21 DIAGNOSIS — K921 Melena: Secondary | ICD-10-CM | POA: Diagnosis not present

## 2023-05-21 DIAGNOSIS — E871 Hypo-osmolality and hyponatremia: Secondary | ICD-10-CM | POA: Diagnosis not present

## 2023-05-21 DIAGNOSIS — E1165 Type 2 diabetes mellitus with hyperglycemia: Secondary | ICD-10-CM | POA: Diagnosis not present

## 2023-05-21 DIAGNOSIS — F1010 Alcohol abuse, uncomplicated: Secondary | ICD-10-CM | POA: Diagnosis not present

## 2023-05-21 DIAGNOSIS — E8809 Other disorders of plasma-protein metabolism, not elsewhere classified: Secondary | ICD-10-CM | POA: Diagnosis not present

## 2023-05-21 DIAGNOSIS — K7011 Alcoholic hepatitis with ascites: Secondary | ICD-10-CM | POA: Diagnosis not present

## 2023-05-21 DIAGNOSIS — K7031 Alcoholic cirrhosis of liver with ascites: Secondary | ICD-10-CM | POA: Diagnosis not present

## 2023-05-21 DIAGNOSIS — K7682 Hepatic encephalopathy: Secondary | ICD-10-CM | POA: Diagnosis not present

## 2023-05-21 LAB — CBC
HCT: 24.7 % — ABNORMAL LOW (ref 39.0–52.0)
Hemoglobin: 8.2 g/dL — ABNORMAL LOW (ref 13.0–17.0)
MCH: 38.3 pg — ABNORMAL HIGH (ref 26.0–34.0)
MCHC: 33.2 g/dL (ref 30.0–36.0)
MCV: 115.4 fL — ABNORMAL HIGH (ref 80.0–100.0)
Platelets: 86 10*3/uL — ABNORMAL LOW (ref 150–400)
RBC: 2.14 MIL/uL — ABNORMAL LOW (ref 4.22–5.81)
RDW: 15.8 % — ABNORMAL HIGH (ref 11.5–15.5)
WBC: 10.5 10*3/uL (ref 4.0–10.5)
nRBC: 0.2 % (ref 0.0–0.2)

## 2023-05-21 LAB — COMPREHENSIVE METABOLIC PANEL
ALT: 32 U/L (ref 0–44)
AST: 105 U/L — ABNORMAL HIGH (ref 15–41)
Albumin: 2.1 g/dL — ABNORMAL LOW (ref 3.5–5.0)
Alkaline Phosphatase: 107 U/L (ref 38–126)
Anion gap: 9 (ref 5–15)
BUN: 12 mg/dL (ref 6–20)
CO2: 30 mmol/L (ref 22–32)
Calcium: 8 mg/dL — ABNORMAL LOW (ref 8.9–10.3)
Chloride: 92 mmol/L — ABNORMAL LOW (ref 98–111)
Creatinine, Ser: 0.72 mg/dL (ref 0.61–1.24)
GFR, Estimated: >60 mL/min (ref >60)
Glucose, Bld: 205 mg/dL — ABNORMAL HIGH (ref 70–99)
Potassium: 3.8 mmol/L (ref 3.5–5.1)
Sodium: 131 mmol/L — ABNORMAL LOW (ref 135–145)
Total Bilirubin: 18.3 mg/dL (ref <1.2)
Total Protein: 5.1 g/dL — ABNORMAL LOW (ref 6.5–8.1)

## 2023-05-21 LAB — GLUCOSE, CAPILLARY
Glucose-Capillary: 100 mg/dL — ABNORMAL HIGH (ref 70–99)
Glucose-Capillary: 196 mg/dL — ABNORMAL HIGH (ref 70–99)
Glucose-Capillary: 289 mg/dL — ABNORMAL HIGH (ref 70–99)

## 2023-05-21 LAB — AFP TUMOR MARKER: AFP, Serum, Tumor Marker: 4.1 ng/mL (ref 0.0–8.4)

## 2023-05-21 LAB — PROTIME-INR
INR: 2.5 — ABNORMAL HIGH (ref 0.8–1.2)
Prothrombin Time: 27.1 s — ABNORMAL HIGH (ref 11.4–15.2)

## 2023-05-21 MED ORDER — LACTULOSE 10 GM/15ML PO SOLN
20.0000 g | Freq: Two times a day (BID) | ORAL | Status: DC
  Administered 2023-05-21 – 2023-05-24 (×7): 20 g via ORAL
  Filled 2023-05-21 (×7): qty 30

## 2023-05-21 MED ORDER — OXYCODONE HCL 5 MG PO TABS
5.0000 mg | ORAL_TABLET | Freq: Once | ORAL | Status: AC
  Administered 2023-05-21: 5 mg via ORAL
  Filled 2023-05-21: qty 1

## 2023-05-21 MED ORDER — RIFAXIMIN 550 MG PO TABS
550.0000 mg | ORAL_TABLET | Freq: Two times a day (BID) | ORAL | Status: DC
  Administered 2023-05-21 – 2023-05-24 (×7): 550 mg via ORAL
  Filled 2023-05-21 (×7): qty 1

## 2023-05-21 NOTE — Plan of Care (Signed)
  Problem: Activity: Goal: Risk for activity intolerance will decrease Outcome: Progressing   Problem: Coping: Goal: Level of anxiety will decrease Outcome: Progressing   Problem: Pain Management: Goal: General experience of comfort will improve Outcome: Progressing

## 2023-05-21 NOTE — Progress Notes (Signed)
SATURATION QUALIFICATIONS: (This note is used to comply with regulatory documentation for home oxygen)  Patient Saturations on Room Air at Rest = 84%  Patient Saturations on 2 Liters of oxygen while Rest = 97%  Please briefly explain why patient needs home oxygen: De-sat on room air at rest at this time

## 2023-05-21 NOTE — Progress Notes (Signed)
PROGRESS NOTE  Shawn Velazquez, is a 57 y.o. male, DOB - 1966/01/28, JXB:[id number]  Admit date - 05/18/2023   Admitting Physician Frankey Shown, DO  Outpatient Primary MD for the patient is [name], MD  LOS - 3  Chief Complaint  Patient presents with   GI Bleeding      Brief Narrative:  57 y.o. male with medical history significant of GERD, esophageal varices, alcoholic cirrhosis admitted on 05/18/2023 with concerns about GI bleed and electrolyte abnormalities    -Assessment and Plan: 1)Acute GI bleed -Discontinued IV octreotide on 05/20/2023 -Continue Protonix -EGD on 05/20/2023 shows--with 3 columns grade 1 esophageal varices improved from prior banding. , no bleeding and no stigmata of recent bleeding. Portal hypertensive gastropathy.- Normal duodenal bulb, first portion of the duodenum and second portion of the duodenum.  2)Hyponatremia/Hypomagnesemia/Hypokalemia--in the setting of alcohol abuse, replace and recheck electrolytes  3) acute hypoxic respiratory failure--with significant left-sided effusion -On 05/20/2023 patient underwent left-sided thoracentesis with 1.5 L of blood-tinged pleural fluid removed -Pleural fluid analysis consistent with transudate per lights criteria -Hypoxia improving currently requiring 2 L of oxygen via nasal cannula down from 6 L  4)Alcoholic Liver cirrhosis with ascites--decompensated liver disease -Elevated LFTs noted pattern consistent with alcoholic liver disease -GI consult appreciated -Lactulose as ordered- --continue rifaximin -Rocephin x 5 days  for SBP prophylaxis -Continue midodrine -Patient had diagnostic paracentesis on 05/20/2023 with 100 mL removed -Plans for therapeutic para on Monday, 05/23/2023 -Patient is being evaluated for possible TIPS by IR -Apparently was previously evaluated by the transplant team at [facility] and deemed not to be a candidate for liver transplant due to ongoing EtOH use -Per GI team not an  ideal candidate for steroids at this time due to concerns about GI bleed  5) acute on chronic anemia--- Hgb down to 8.2 from a baseline usually around 10 -Monitor closely and transfuse as clinically indicated  6)Thrombocytopenia--due to underlying liver cirrhosis, -Platelets currently greater than 80K -Monitor closely and transfuse as clinically indicated  7)DM2- Use Novolog/Humalog Sliding scale insulin with Accu-Cheks/Fingersticks as ordered   8)Social/Ethics---overall poor prognosis given liver failure, patient not a candidate for transplant and ongoing alcohol use -Get official palliative care consult for goals of care delineation - Status is: Inpatient   Disposition: The patient is from: Home              Anticipated d/c is to: Home              Anticipated d/c date is: 3 days              Patient currently is not medically stable to d/c. Barriers: Not Clinically Stable-   Code Status :  -  Code Status: Full Code   Family Communication:    NA (patient is alert, awake and coherent)   DVT Prophylaxis  :   - SCDs *  SCDs Start: 05/18/23 2248   Lab Results  Component Value Date   PLT 86 (L) 05/21/2023   Inpatient Medications Scheduled Meds:  Chlorhexidine Gluconate Cloth  6 each Topical Daily   insulin aspart  0-15 Units Subcutaneous Q4H   lactulose  20 g Oral BID   pantoprazole (PROTONIX) IV  40 mg Intravenous Q12H   rifaximin  550 mg Oral BID   Continuous Infusions:  cefTRIAXone (ROCEPHIN)  IV Stopped (05/21/23 1056)   PRN Meds:.ipratropium-albuterol, prochlorperazine   Anti-infectives (From admission, onward)    Start     Dose/Rate Route Frequency Ordered Stop  05/21/23 1115  rifaximin (XIFAXAN) tablet 550 mg        550 mg Oral 2 times daily 05/21/23 1020     05/19/23 1030  cefTRIAXone (ROCEPHIN) 1 g in sodium chloride 0.9 % 100 mL IVPB        1 g 200 mL/hr over 30 Minutes Intravenous Every 24 hours 05/19/23 0942     05/18/23 1915  cefTRIAXone (ROCEPHIN) 1  g in sodium chloride 0.9 % 100 mL IVPB        1 g 200 mL/hr over 30 Minutes Intravenous  Once 05/18/23 1911 05/18/23 2016      Subjective: Shawn Velazquez today has no fevers, no emesis,  No chest pain, -No BM -No BM since admission--- no further GI bleed concerns  Objective: Vitals:   05/21/23 1100 05/21/23 1123 05/21/23 1200 05/21/23 1313  BP: 101/61  109/65 107/65  Pulse: 89  91 91  Resp: 12  19   Temp:  98.3 F (36.8 C)  98.2 F (36.8 C)  TempSrc:  Oral  Oral  SpO2: 96%  95% 95%  Weight:      Height:        Intake/Output Summary (Last 24 hours) at 05/21/2023 1506 Last data filed at 05/21/2023 1420 Gross per 24 hour  Intake 1813.45 ml  Output 1255 ml  Net 558.45 ml   Filed Weights   05/18/23 1857 05/19/23 0821 05/21/23 0459  Weight: 85.3 kg 90 kg 91.9 kg   Physical Exam Gen:- Awake Alert,  in no apparent distress  HEENT:- [place].AT,  +ve sclera icterus Nose-Daniel 2 L/min Neck-Supple Neck,No JVD,.  Lungs-improved air movement on the left after thoracentesis, no wheezing  CV- S1, S2 normal, regular  Abd-  +ve B.Sounds, Abd Soft, improved epigastric tenderness, small umbilical hernia noted, distention/ascites noted  Extremity/Skin:-Jaundice, pedal pulses present  Psych-affect is appropriate, oriented x3 Neuro-Generalized weakness, no new focal deficits, no tremors  Data Reviewed: I have personally reviewed following labs and imaging studies  CBC: Recent Labs  Lab 05/18/23 1944 05/19/23 0415 05/20/23 0501 05/21/23 0450  WBC 9.2 10.4 12.8* 10.5  NEUTROABS 6.1  --  9.1*  --   HGB 9.5* 8.4* 8.2* 8.2*  HCT 25.5* 24.7* 24.1* 24.7*  MCV 111.8* 110.8* 112.1* 115.4*  PLT 94* 74* 94* 86*   Basic Metabolic Panel: Recent Labs  Lab 05/18/23 1944 05/19/23 0415 05/20/23 0501 05/21/23 0450  NA 129* 132* 127* 131*  K 2.9* 3.4* 3.7 3.8  CL 86* 90* 88* 92*  CO2 32 30 29 30   GLUCOSE 163* 106* 127* 205*  BUN 11 13 14 12   CREATININE 0.79 0.73 0.78 0.72  CALCIUM 8.1*  7.8* 7.9* 8.0*  MG 1.5*  --   --   --    GFR: Estimated Creatinine Clearance: 105.5 mL/min (by C-G formula based on SCr of 0.72 mg/dL). Liver Function Tests: Recent Labs  Lab 05/18/23 1944 05/19/23 0415 05/20/23 0501 05/21/23 0450  AST 116* 98* 95* 105*  ALT 40 37 36 32  ALKPHOS 139* 111 115 107  BILITOT 17.8* 16.3* 17.4* 18.3*  PROT 5.8* 4.9* 5.4* 5.1*  ALBUMIN 2.4* 2.0* 2.1* 2.1*   Recent Results (from the past 240 hour(s))  MRSA Next Gen by PCR, Nasal     Status: None   Collection Time: 05/19/23 12:13 AM   Specimen: Nasal Mucosa; Nasal Swab  Result Value Ref Range Status   MRSA by PCR Next Gen NOT DETECTED NOT DETECTED Final    Comment: (NOTE)  The GeneXpert MRSA Assay (FDA approved for NASAL specimens only), is one component of a comprehensive MRSA colonization surveillance program. It is not intended to diagnose MRSA infection nor to guide or monitor treatment for MRSA infections. Test performance is not FDA approved in patients less than 20 years old. Performed at [facility], [address] [place], [place] [address]   Gram stain     Status: None   Collection Time: 05/20/23 11:32 AM   Specimen: Pleura  Result Value Ref Range Status   Specimen Description PLEURAL LEFT  Final   Special Requests NONE  Final   Gram Stain   Final    WBC PRESENT,BOTH PMN AND MONONUCLEAR NO ORGANISMS SEEN CYTOSPIN SMEAR Performed at [facility], [address], [place], [place] [address]    Report Status 05/20/2023 FINAL  Final  Culture, body fluid w Gram Stain-bottle     Status: None (Preliminary result)   Collection Time: 05/20/23 11:32 AM   Specimen: Pleura  Result Value Ref Range Status   Specimen Description PLEURAL LEFT  Final   Special Requests 10CC  Final   Culture   Final    NO GROWTH < 24 HOURS Performed at [facility], [address] [place], [place] [address]    Report Status PENDING  Incomplete  Culture, body fluid w Gram Stain-bottle     Status: None  (Preliminary result)   Collection Time: 05/20/23 11:45 AM   Specimen: Ascitic  Result Value Ref Range Status   Specimen Description ASCITIC  Final   Special Requests 10CC BOTTLES DRAWN AEROBIC AND ANAEROBIC  Final   Culture   Final    NO GROWTH < 24 HOURS Performed at [facility], [address], [place], [place] [address]    Report Status PENDING  Incomplete  Gram stain     Status: None   Collection Time: 05/20/23 11:45 AM   Specimen: Ascitic  Result Value Ref Range Status   Specimen Description ASCITIC  Final   Special Requests ASCITIC  Final   Gram Stain   Final    WBC PRESENT,BOTH PMN AND MONONUCLEAR NO ORGANISMS SEEN CYTOSPIN SMEAR Performed at [facility], [address], [place], [place] [address]    Report Status 05/20/2023 FINAL  Final    Radiology Studies: US Paracentesis  Result Date: 05/20/2023 INDICATION: Patient with a history of alcoholic cirrhosis with recurrent ascites. Diagnostic paracentesis requested. EXAM: ULTRASOUND GUIDED PARACENTESIS MEDICATIONS: 1% lidocaine 10 ml. COMPLICATIONS: None immediate. PROCEDURE: Informed written consent was obtained from the patient after a discussion of the risks, benefits and alternatives to treatment. A timeout was performed prior to the initiation of the procedure. Initial ultrasound scanning demonstrates a large amount of ascites within the right lower abdominal quadrant. The right lower abdomen was prepped and draped in the usual sterile fashion. 1% lidocaine was used for local anesthesia. Following this, a 19 gauge, 7-cm, Yueh catheter was introduced. An ultrasound image was saved for documentation purposes. The paracentesis was performed. The catheter was removed and a dressing was applied. The patient tolerated the procedure well without immediate post procedural complication. A total of approximately 100 ml of clear yellow fluid was removed. Samples were sent to the laboratory as requested by the clinical team. IMPRESSION:  Successful ultrasound-guided paracentesis yielding 100 ml of peritoneal fluid. Procedure performed by Alwyn Ren, NP PLAN: The patient has previously been formally evaluated by the [facility] Interventional Radiology Portal Hypertension [facility] and is being actively followed for potential future intervention. **  Pending clinic evaluation to discuss TIPS. Electronically Signed   By: Marliss Coots M.D.   On: 05/20/2023 12:36   DG Chest Port 1 View  Result Date: 05/20/2023 CLINICAL DATA:  57 year old male status post left thoracentesis. EXAM: PORTABLE CHEST - 1 VIEW COMPARISON:  05/18/2023 FINDINGS: The mediastinal contours are within normal limits. No cardiomegaly. No evidence of significant left pleural effusion or evidence of left pneumothorax after thoracentesis. Low lung volumes bilaterally. No new focal consolidation. No acute osseous abnormality. IMPRESSION: No evidence of significant left pleural effusion or pneumothorax after thoracentesis. Electronically Signed   By: Marliss Coots M.D.   On: 05/20/2023 12:28   US THORACENTESIS ASP PLEURAL SPACE W/IMG GUIDE  Result Date: 05/20/2023 INDICATION: Patient with a history of cirrhosis found to have left pleural effusion. Diagnostic and therapeutic thoracentesis requested. EXAM: ULTRASOUND GUIDED THORACENTESIS MEDICATIONS: 1% lidocaine 10 ml . COMPLICATIONS: None immediate. PROCEDURE: An ultrasound guided thoracentesis was thoroughly discussed with the patient and questions answered. The benefits, risks, alternatives and complications were also discussed. The patient understands and wishes to proceed with the procedure. Written consent was obtained. Ultrasound was performed to localize and mark an adequate pocket of fluid in the left chest. The area was then prepped and draped in the normal sterile fashion. 1% Lidocaine was used for local anesthesia. Under ultrasound guidance a 6 Fr Safe-T-Centesis catheter was introduced. Thoracentesis was performed.  The catheter was removed and a dressing applied. FINDINGS: A total of approximately 1.5 L of blood-tinged fluid was removed. Samples were sent to the laboratory as requested by the clinical team. IMPRESSION: Successful ultrasound guided left thoracentesis yielding 1.5 L of pleural fluid. Procedure performed by Alwyn Ren, NP Electronically Signed   By: Marliss Coots M.D.   On: 05/20/2023 12:26    Scheduled Meds:  Chlorhexidine Gluconate Cloth  6 each Topical Daily   insulin aspart  0-15 Units Subcutaneous Q4H   lactulose  20 g Oral BID   pantoprazole (PROTONIX) IV  40 mg Intravenous Q12H   rifaximin  550 mg Oral BID   Continuous Infusions:  cefTRIAXone (ROCEPHIN)  IV Stopped (05/21/23 1056)    LOS: 3 days   Shon Hale M.D on 05/21/2023 at 3:06 PM  Go to [email or web address] - for contact info  [facility] - Office  [phone]  If 7PM-7AM, please contact night-coverage [email or web address] 05/21/2023, 3:06 PM

## 2023-05-21 NOTE — Plan of Care (Signed)

## 2023-05-21 NOTE — Progress Notes (Addendum)
Subjective: Patient seen and examined.  No bowel movement in 3 days.  Hemoglobin stable.  Tolerating soft diet though appetite decreased.  Objective: Vital signs in last 24 hours: Temp:  [97.9 F (36.6 C)-98.5 F (36.9 C)] 98.5 F (36.9 C) (11/16 0724) Pulse Rate:  [66-94] 93 (11/16 0900) Resp:  [13-21] 20 (11/16 0900) BP: (90-110)/(45-83) 94/49 (11/16 0900) SpO2:  [81 %-100 %] 96 % (11/16 0938) Weight:  [91.9 kg] 91.9 kg (11/16 0459) Last BM Date : 05/18/23 General:   Alert and oriented, pleasant Head:  Normocephalic and atraumatic. Eyes: Scleral icterus conjuctiva pink.  Abdomen:  Bowel sounds present, soft, non-tender, non-distended. No HSM or hernias noted. No rebound or guarding. No masses appreciated  Msk:  Symmetrical without gross deformities. Normal posture. Extremities:  Without clubbing or edema. Neurologic:  Alert and  oriented x4;  grossly normal neurologically. Skin:  Warm and dry, intact without significant lesions.  Diffuse jaundice Cervical Nodes:  No significant cervical adenopathy. Psych:  Alert and cooperative. Normal mood and affect.  Intake/Output from previous day: 11/15 0701 - 11/16 0700 In: 1471.6 [P.O.:480; I.V.:848.3; IV Piggyback:143.2] Out: 1130 [Urine:1130] Intake/Output this shift: No intake/output data recorded.  Lab Results: Recent Labs    05/19/23 0415 05/20/23 0501 05/21/23 0450  WBC 10.4 12.8* 10.5  HGB 8.4* 8.2* 8.2*  HCT 24.7* 24.1* 24.7*  PLT 74* 94* 86*   BMET Recent Labs    05/19/23 0415 05/20/23 0501 05/21/23 0450  NA 132* 127* 131*  K 3.4* 3.7 3.8  CL 90* 88* 92*  CO2 30 29 30   GLUCOSE 106* 127* 205*  BUN 13 14 12   CREATININE 0.73 0.78 0.72  CALCIUM 7.8* 7.9* 8.0*   LFT Recent Labs    05/19/23 0415 05/20/23 0501 05/21/23 0450  PROT 4.9* 5.4* 5.1*  ALBUMIN 2.0* 2.1* 2.1*  AST 98* 95* 105*  ALT 37 36 32  ALKPHOS 111 115 107  BILITOT 16.3* 17.4* 18.3*   PT/INR Recent Labs    05/20/23 1204 05/21/23 0450   LABPROT 26.4* 27.1*  INR 2.4* 2.5*   Hepatitis Panel No results for input(s): "HEPBSAG", "HCVAB", "HEPAIGM", "HEPBIGM" in the last 72 hours.   Studies/Results: US Paracentesis  Result Date: 05/20/2023 INDICATION: Patient with a history of alcoholic cirrhosis with recurrent ascites. Diagnostic paracentesis requested. EXAM: ULTRASOUND GUIDED PARACENTESIS MEDICATIONS: 1% lidocaine 10 ml. COMPLICATIONS: None immediate. PROCEDURE: Informed written consent was obtained from the patient after a discussion of the risks, benefits and alternatives to treatment. A timeout was performed prior to the initiation of the procedure. Initial ultrasound scanning demonstrates a large amount of ascites within the right lower abdominal quadrant. The right lower abdomen was prepped and draped in the usual sterile fashion. 1% lidocaine was used for local anesthesia. Following this, a 19 gauge, 7-cm, Yueh catheter was introduced. An ultrasound image was saved for documentation purposes. The paracentesis was performed. The catheter was removed and a dressing was applied. The patient tolerated the procedure well without immediate post procedural complication. A total of approximately 100 ml of clear yellow fluid was removed. Samples were sent to the laboratory as requested by the clinical team. IMPRESSION: Successful ultrasound-guided paracentesis yielding 100 ml of peritoneal fluid. Procedure performed by Alwyn Ren, NP PLAN: The patient has previously been formally evaluated by the [facility] Interventional Radiology Portal Hypertension Clinic and is being actively followed for potential future intervention. ** Pending clinic evaluation to discuss TIPS. Electronically Signed   By: Marliss Coots M.D.   On:  05/20/2023 12:36   DG Chest Port 1 View  Result Date: 05/20/2023 CLINICAL DATA:  57 year old male status post left thoracentesis. EXAM: PORTABLE CHEST - 1 VIEW COMPARISON:  05/18/2023 FINDINGS: The mediastinal  contours are within normal limits. No cardiomegaly. No evidence of significant left pleural effusion or evidence of left pneumothorax after thoracentesis. Low lung volumes bilaterally. No new focal consolidation. No acute osseous abnormality. IMPRESSION: No evidence of significant left pleural effusion or pneumothorax after thoracentesis. Electronically Signed   By: Marliss Coots M.D.   On: 05/20/2023 12:28   US THORACENTESIS ASP PLEURAL SPACE W/IMG GUIDE  Result Date: 05/20/2023 INDICATION: Patient with a history of cirrhosis found to have left pleural effusion. Diagnostic and therapeutic thoracentesis requested. EXAM: ULTRASOUND GUIDED THORACENTESIS MEDICATIONS: 1% lidocaine 10 ml . COMPLICATIONS: None immediate. PROCEDURE: An ultrasound guided thoracentesis was thoroughly discussed with the patient and questions answered. The benefits, risks, alternatives and complications were also discussed. The patient understands and wishes to proceed with the procedure. Written consent was obtained. Ultrasound was performed to localize and mark an adequate pocket of fluid in the left chest. The area was then prepped and draped in the normal sterile fashion. 1% Lidocaine was used for local anesthesia. Under ultrasound guidance a 6 Fr Safe-T-Centesis catheter was introduced. Thoracentesis was performed. The catheter was removed and a dressing applied. FINDINGS: A total of approximately 1.5 L of blood-tinged fluid was removed. Samples were sent to the laboratory as requested by the clinical team. IMPRESSION: Successful ultrasound guided left thoracentesis yielding 1.5 L of pleural fluid. Procedure performed by Alwyn Ren, NP Electronically Signed   By: Marliss Coots M.D.   On: 05/20/2023 12:26    Assessment/Plan:  1.  GI bleed/hematochezia-no further bleeding since a day prior to admission.  Upper endoscopy 05/20/2023 with 3 columns grade 1 esophageal varices improved from prior banding.  No active or stigmata of  bleeding.  Hemoglobin stable.  Continue to monitor.  Continue IV Rocephin x 5 days total.  Okay to discontinue octreotide.  2.  Decompensated alcoholic cirrhosis complicated by portal hypertension, ascites, hydrothorax, hepatic encephalopathy. MELD 3.0 of 31.  Continue daily LFTs and coags.  Status post thoracentesis 1.5L removed 05/20/2023, diagnostic paracentesis negative for SBP.  Resume lactulose today as patient has not had a bowel movement in 3 days given his history of hepatic encephalopathy prior.  Xifaxan 550 mg twice daily.  Continue midodrine.  Unfortunate poor prognosis for this patient due to acute alcoholic hepatitis in the setting of decompensated cirrhosis, worsening liver synthetic function, ongoing alcohol use.  Patient has been evaluated by [facility] liver transplant team and currently deemed to not be a liver transplant candidate given ongoing EtOH use. Has follow-up with them scheduled in January.   Consider palliative care involvement  3.  Alcoholic hepatitis-hold off on steroids given GI bleed.  Absolute alcohol cessation going forward.     Hennie Duos. Marletta Lor, D.O. Gastroenterology and Hepatology [facility]   LOS: 3 days    05/21/2023, 10:11 AM

## 2023-05-22 DIAGNOSIS — J9601 Acute respiratory failure with hypoxia: Secondary | ICD-10-CM | POA: Diagnosis not present

## 2023-05-22 DIAGNOSIS — K7031 Alcoholic cirrhosis of liver with ascites: Secondary | ICD-10-CM | POA: Diagnosis not present

## 2023-05-22 DIAGNOSIS — K922 Gastrointestinal hemorrhage, unspecified: Secondary | ICD-10-CM | POA: Diagnosis not present

## 2023-05-22 LAB — GLUCOSE, CAPILLARY
Glucose-Capillary: 173 mg/dL — ABNORMAL HIGH (ref 70–99)
Glucose-Capillary: 137 mg/dL — ABNORMAL HIGH (ref 70–99)
Glucose-Capillary: 167 mg/dL — ABNORMAL HIGH (ref 70–99)
Glucose-Capillary: 193 mg/dL — ABNORMAL HIGH (ref 70–99)
Glucose-Capillary: 282 mg/dL — ABNORMAL HIGH (ref 70–99)

## 2023-05-22 LAB — CBC
HCT: 25.2 % — ABNORMAL LOW (ref 39.0–52.0)
Hemoglobin: 8.3 g/dL — ABNORMAL LOW (ref 13.0–17.0)
MCH: 38.1 pg — ABNORMAL HIGH (ref 26.0–34.0)
MCHC: 32.9 g/dL (ref 30.0–36.0)
MCV: 115.6 fL — ABNORMAL HIGH (ref 80.0–100.0)
Platelets: 91 10*3/uL — ABNORMAL LOW (ref 150–400)
RBC: 2.18 MIL/uL — ABNORMAL LOW (ref 4.22–5.81)
RDW: 15.7 % — ABNORMAL HIGH (ref 11.5–15.5)
WBC: 10.8 10*3/uL — ABNORMAL HIGH (ref 4.0–10.5)
nRBC: 0 % (ref 0.0–0.2)

## 2023-05-22 NOTE — Progress Notes (Signed)
This nurse had to put his o2 back on, this nurse went to check on him and he was sleeping and o2 was in the 80's. 2L n/c back on pt and o2 sat brought back up to 94%

## 2023-05-22 NOTE — Progress Notes (Signed)
PROGRESS NOTE  Shawn Velazquez, is a 57 y.o. male, DOB - September 07, 1965, ZOX:[id number]  Admit date - 05/18/2023   Admitting Physician Frankey Shown, DO  Outpatient Primary MD for the patient is [name], MD  LOS - 4  Chief Complaint  Patient presents with   GI Bleeding      Brief Narrative:  57 y.o. male with medical history significant of GERD, esophageal varices, alcoholic cirrhosis admitted on 05/18/2023 with concerns about GI bleed and electrolyte abnormalities    -Assessment and Plan: 1)Acute GI bleed -Discontinued IV octreotide on 05/20/2023 -Continue Protonix -EGD on 05/20/2023 shows--with 3 columns grade 1 esophageal varices improved from prior banding. , no bleeding and no stigmata of recent bleeding. Portal hypertensive gastropathy.- Normal duodenal bulb, first portion of the duodenum and second portion of the duodenum.  2)Hyponatremia/Hypomagnesemia/Hypokalemia--in the setting of alcohol abuse, replace and recheck electrolytes  3) acute hypoxic respiratory failure--with significant left-sided effusion -On 05/20/2023 patient underwent left-sided thoracentesis with 1.5 L of blood-tinged pleural fluid removed -Pleural fluid analysis consistent with transudate per lights criteria 05/22/23 --Attempted to wean patient off oxygen but desaturated again--hypoxia improved patient current requiring 2 L of oxygen down from 6 L  4)Alcoholic Liver cirrhosis with ascites--decompensated liver disease -Elevated LFTs noted pattern consistent with alcoholic liver disease -GI consult appreciated -Lactulose as ordered- --continue rifaximin -Rocephin x 5 days  for SBP prophylaxis -Continue midodrine -Patient had diagnostic paracentesis on 05/20/2023 with 100 mL removed -Plans for therapeutic para on Monday, 05/23/2023 -Patient is being evaluated for possible TIPS by IR -Apparently was previously evaluated by the transplant team at [facility] and deemed not to be a candidate for liver  transplant due to ongoing EtOH use -Per GI team not an ideal candidate for steroids at this time due to concerns about GI bleed  5) acute on chronic anemia--- Hgb down to 8.2 from a baseline usually around 10 -Monitor closely and transfuse as clinically indicated  6)Thrombocytopenia--due to underlying liver cirrhosis, -Platelets currently greater than 80K -Monitor closely and transfuse as clinically indicated  7)DM2- Use Novolog/Humalog Sliding scale insulin with Accu-Cheks/Fingersticks as ordered   8)Social/Ethics---overall poor prognosis given liver failure, patient not a candidate for transplant and ongoing alcohol use -Get official palliative care consult for goals of care delineation - Status is: Inpatient   Disposition: The patient is from: Home              Anticipated d/c is to: Home              Anticipated d/c date is: 3 days              Patient currently is not medically stable to d/c. Barriers: Not Clinically Stable-   Code Status :  -  Code Status: Full Code   Family Communication:    NA (patient is alert, awake and coherent)   DVT Prophylaxis  :   - SCDs *  SCDs Start: 05/18/23 2248   Lab Results  Component Value Date   PLT 91 (L) 05/22/2023   Inpatient Medications Scheduled Meds:  insulin aspart  0-15 Units Subcutaneous Q4H   lactulose  20 g Oral BID   pantoprazole (PROTONIX) IV  40 mg Intravenous Q12H   rifaximin  550 mg Oral BID   Continuous Infusions:  PRN Meds:.ipratropium-albuterol, prochlorperazine   Anti-infectives (From admission, onward)    Start     Dose/Rate Route Frequency Ordered Stop   05/21/23 1115  rifaximin (XIFAXAN) tablet 550 mg  550 mg Oral 2 times daily 05/21/23 1020     05/19/23 1030  cefTRIAXone (ROCEPHIN) 1 g in sodium chloride 0.9 % 100 mL IVPB        1 g 200 mL/hr over 30 Minutes Intravenous Every 24 hours 05/19/23 0942 05/22/23 0907   05/18/23 1915  cefTRIAXone (ROCEPHIN) 1 g in sodium chloride 0.9 % 100 mL IVPB         1 g 200 mL/hr over 30 Minutes Intravenous  Once 05/18/23 1911 05/18/23 2016      Subjective: Meribeth Mattes today has no fevers, no emesis,  No chest pain,  no further GI bleed concerns -Attempted to wean patient off oxygen but desaturated again  Objective: Vitals:   05/21/23 2024 05/22/23 0404 05/22/23 0840 05/22/23 1304  BP: 116/80 123/82  (!) 83/57  Pulse: 92 97  98  Resp: 15 15  18   Temp: 98.2 F (36.8 C) 98.3 F (36.8 C)  98.1 F (36.7 C)  TempSrc: Oral Oral  Oral  SpO2: 93% 95% 93% 94%  Weight:      Height:        Intake/Output Summary (Last 24 hours) at 05/22/2023 1837 Last data filed at 05/22/2023 1742 Gross per 24 hour  Intake 600 ml  Output 600 ml  Net 0 ml   Filed Weights   05/18/23 1857 05/19/23 0821 05/21/23 0459  Weight: 85.3 kg 90 kg 91.9 kg   Physical Exam Gen:- Awake Alert,  in no apparent distress  HEENT:- [place].AT,  +ve sclera icterus Nose-[place] 2 L/min Neck-Supple Neck,No JVD,.  Lungs-improved air movement on the left after thoracentesis, no wheezing  CV- S1, S2 normal, regular  Abd-  +ve B.Sounds, Abd Soft, improved epigastric tenderness, small umbilical hernia noted, distention/ascites noted  Extremity/Skin:-Jaundice, pedal pulses present  Psych-affect is appropriate, oriented x3 Neuro-Generalized weakness, no new focal deficits, no tremors  Data Reviewed: I have personally reviewed following labs and imaging studies  CBC: Recent Labs  Lab 05/18/23 1944 05/19/23 0415 05/20/23 0501 05/21/23 0450 05/22/23 0415  WBC 9.2 10.4 12.8* 10.5 10.8*  NEUTROABS 6.1  --  9.1*  --   --   HGB 9.5* 8.4* 8.2* 8.2* 8.3*  HCT 25.5* 24.7* 24.1* 24.7* 25.2*  MCV 111.8* 110.8* 112.1* 115.4* 115.6*  PLT 94* 74* 94* 86* 91*   Basic Metabolic Panel: Recent Labs  Lab 05/18/23 1944 05/19/23 0415 05/20/23 0501 05/21/23 0450  NA 129* 132* 127* 131*  K 2.9* 3.4* 3.7 3.8  CL 86* 90* 88* 92*  CO2 32 30 29 30   GLUCOSE 163* 106* 127* 205*  BUN 11 13  14 12   CREATININE 0.79 0.73 0.78 0.72  CALCIUM 8.1* 7.8* 7.9* 8.0*  MG 1.5*  --   --   --    GFR: Estimated Creatinine Clearance: 105.5 mL/min (by C-G formula based on SCr of 0.72 mg/dL). Liver Function Tests: Recent Labs  Lab 05/18/23 1944 05/19/23 0415 05/20/23 0501 05/21/23 0450  AST 116* 98* 95* 105*  ALT 40 37 36 32  ALKPHOS 139* 111 115 107  BILITOT 17.8* 16.3* 17.4* 18.3*  PROT 5.8* 4.9* 5.4* 5.1*  ALBUMIN 2.4* 2.0* 2.1* 2.1*   Recent Results (from the past 240 hour(s))  MRSA Next Gen by PCR, Nasal     Status: None   Collection Time: 05/19/23 12:13 AM   Specimen: Nasal Mucosa; Nasal Swab  Result Value Ref Range Status   MRSA by PCR Next Gen NOT DETECTED NOT DETECTED Final  Comment: (NOTE) The GeneXpert MRSA Assay (FDA approved for NASAL specimens only), is one component of a comprehensive MRSA colonization surveillance program. It is not intended to diagnose MRSA infection nor to guide or monitor treatment for MRSA infections. Test performance is not FDA approved in patients less than 69 years old. Performed at [facility], [address], [place], [place] [address]   Gram stain     Status: None   Collection Time: 05/20/23 11:32 AM   Specimen: Pleura  Result Value Ref Range Status   Specimen Description PLEURAL LEFT  Final   Special Requests NONE  Final   Gram Stain   Final    WBC PRESENT,BOTH PMN AND MONONUCLEAR NO ORGANISMS SEEN CYTOSPIN SMEAR Performed at [facility], [address], [place], [place] [address]    Report Status 05/20/2023 FINAL  Final  Culture, body fluid w Gram Stain-bottle     Status: None (Preliminary result)   Collection Time: 05/20/23 11:32 AM   Specimen: Pleura  Result Value Ref Range Status   Specimen Description PLEURAL LEFT  Final   Special Requests 10CC  Final   Culture   Final    NO GROWTH 2 DAYS Performed at [facility], [address], [place], [place] [address]    Report Status PENDING  Incomplete  Culture, body  fluid w Gram Stain-bottle     Status: None (Preliminary result)   Collection Time: 05/20/23 11:45 AM   Specimen: Ascitic  Result Value Ref Range Status   Specimen Description ASCITIC  Final   Special Requests 10CC BOTTLES DRAWN AEROBIC AND ANAEROBIC  Final   Culture   Final    NO GROWTH 2 DAYS Performed at [facility], [address] [place], [place] [address]    Report Status PENDING  Incomplete  Gram stain     Status: None   Collection Time: 05/20/23 11:45 AM   Specimen: Ascitic  Result Value Ref Range Status   Specimen Description ASCITIC  Final   Special Requests ASCITIC  Final   Gram Stain   Final    WBC PRESENT,BOTH PMN AND MONONUCLEAR NO ORGANISMS SEEN CYTOSPIN SMEAR Performed at [facility], [address] [place], [place] [address]    Report Status 05/20/2023 FINAL  Final    Scheduled Meds:  insulin aspart  0-15 Units Subcutaneous Q4H   lactulose  20 g Oral BID   pantoprazole (PROTONIX) IV  40 mg Intravenous Q12H   rifaximin  550 mg Oral BID   Continuous Infusions:   LOS: 4 days   Shon Hale M.D on 05/22/2023 at 6:37 PM  Go to [email or web address] - for contact info  [facility] - Office  [phone]  If 7PM-7AM, please contact night-coverage [email or web address] 05/22/2023, 6:37 PM

## 2023-05-22 NOTE — Plan of Care (Signed)

## 2023-05-22 NOTE — Progress Notes (Signed)
This nurse did a trial for o2 saturation as the patient does not wear oxygen at home. Pt was 88%-90% on RA with ambulation and after resting in the bed he jumped back up to 93-95%. Left patient on RA and gave patient a spirometry and educated on how to do it and had pt used the teach back method. Notified. Courage, MD.

## 2023-05-23 ENCOUNTER — Inpatient Hospital Stay (HOSPITAL_COMMUNITY): Payer: BC Managed Care – PPO

## 2023-05-23 ENCOUNTER — Ambulatory Visit (HOSPITAL_COMMUNITY): Admission: RE | Admit: 2023-05-23 | Payer: BC Managed Care – PPO | Source: Home / Self Care

## 2023-05-23 ENCOUNTER — Encounter (HOSPITAL_COMMUNITY): Payer: Self-pay | Admitting: Internal Medicine

## 2023-05-23 ENCOUNTER — Encounter (HOSPITAL_COMMUNITY): Admission: RE | Payer: Self-pay | Source: Home / Self Care

## 2023-05-23 DIAGNOSIS — K922 Gastrointestinal hemorrhage, unspecified: Secondary | ICD-10-CM | POA: Diagnosis not present

## 2023-05-23 DIAGNOSIS — I8500 Esophageal varices without bleeding: Secondary | ICD-10-CM

## 2023-05-23 DIAGNOSIS — D689 Coagulation defect, unspecified: Secondary | ICD-10-CM

## 2023-05-23 DIAGNOSIS — R600 Localized edema: Secondary | ICD-10-CM

## 2023-05-23 DIAGNOSIS — K7031 Alcoholic cirrhosis of liver with ascites: Secondary | ICD-10-CM | POA: Diagnosis not present

## 2023-05-23 DIAGNOSIS — Z7189 Other specified counseling: Secondary | ICD-10-CM | POA: Diagnosis not present

## 2023-05-23 DIAGNOSIS — Z515 Encounter for palliative care: Secondary | ICD-10-CM | POA: Diagnosis not present

## 2023-05-23 DIAGNOSIS — E871 Hypo-osmolality and hyponatremia: Secondary | ICD-10-CM | POA: Diagnosis not present

## 2023-05-23 DIAGNOSIS — F1010 Alcohol abuse, uncomplicated: Secondary | ICD-10-CM | POA: Diagnosis not present

## 2023-05-23 DIAGNOSIS — K7030 Alcoholic cirrhosis of liver without ascites: Secondary | ICD-10-CM | POA: Diagnosis not present

## 2023-05-23 DIAGNOSIS — E876 Hypokalemia: Secondary | ICD-10-CM | POA: Diagnosis not present

## 2023-05-23 DIAGNOSIS — K625 Hemorrhage of anus and rectum: Secondary | ICD-10-CM

## 2023-05-23 LAB — GLUCOSE, CAPILLARY
Glucose-Capillary: 146 mg/dL — ABNORMAL HIGH (ref 70–99)
Glucose-Capillary: 158 mg/dL — ABNORMAL HIGH (ref 70–99)
Glucose-Capillary: 174 mg/dL — ABNORMAL HIGH (ref 70–99)
Glucose-Capillary: 195 mg/dL — ABNORMAL HIGH (ref 70–99)
Glucose-Capillary: 196 mg/dL — ABNORMAL HIGH (ref 70–99)
Glucose-Capillary: 198 mg/dL — ABNORMAL HIGH (ref 70–99)
Glucose-Capillary: 201 mg/dL — ABNORMAL HIGH (ref 70–99)
Glucose-Capillary: 207 mg/dL — ABNORMAL HIGH (ref 70–99)
Glucose-Capillary: 257 mg/dL — ABNORMAL HIGH (ref 70–99)

## 2023-05-23 LAB — CBC
HCT: 25.4 % — ABNORMAL LOW (ref 39.0–52.0)
Hemoglobin: 8.3 g/dL — ABNORMAL LOW (ref 13.0–17.0)
MCH: 37.4 pg — ABNORMAL HIGH (ref 26.0–34.0)
MCHC: 32.7 g/dL (ref 30.0–36.0)
MCV: 114.4 fL — ABNORMAL HIGH (ref 80.0–100.0)
Platelets: 80 10*3/uL — ABNORMAL LOW (ref 150–400)
RBC: 2.22 MIL/uL — ABNORMAL LOW (ref 4.22–5.81)
RDW: 15.4 % (ref 11.5–15.5)
WBC: 11 10*3/uL — ABNORMAL HIGH (ref 4.0–10.5)
nRBC: 0 % (ref 0.0–0.2)

## 2023-05-23 LAB — COMPREHENSIVE METABOLIC PANEL
ALT: 38 U/L (ref 0–44)
AST: 122 U/L — ABNORMAL HIGH (ref 15–41)
Albumin: 2 g/dL — ABNORMAL LOW (ref 3.5–5.0)
Alkaline Phosphatase: 131 U/L — ABNORMAL HIGH (ref 38–126)
Anion gap: 9 (ref 5–15)
BUN: 10 mg/dL (ref 6–20)
CO2: 27 mmol/L (ref 22–32)
Calcium: 8 mg/dL — ABNORMAL LOW (ref 8.9–10.3)
Chloride: 90 mmol/L — ABNORMAL LOW (ref 98–111)
Creatinine, Ser: 0.86 mg/dL (ref 0.61–1.24)
GFR, Estimated: >60 mL/min (ref >60)
Glucose, Bld: 170 mg/dL — ABNORMAL HIGH (ref 70–99)
Potassium: 3.7 mmol/L (ref 3.5–5.1)
Sodium: 126 mmol/L — ABNORMAL LOW (ref 135–145)
Total Bilirubin: 16.6 mg/dL — ABNORMAL HIGH (ref <1.2)
Total Protein: 5.2 g/dL — ABNORMAL LOW (ref 6.5–8.1)

## 2023-05-23 LAB — CYTOLOGY - NON PAP

## 2023-05-23 LAB — PROTIME-INR
INR: 2.7 — ABNORMAL HIGH (ref 0.8–1.2)
Prothrombin Time: 28.5 s — ABNORMAL HIGH (ref 11.4–15.2)

## 2023-05-23 SURGERY — ESOPHAGOGASTRODUODENOSCOPY (EGD) WITH PROPOFOL
Anesthesia: Monitor Anesthesia Care

## 2023-05-23 MED ORDER — PHYTONADIONE 5 MG PO TABS
5.0000 mg | ORAL_TABLET | Freq: Once | ORAL | Status: AC
  Administered 2023-05-23: 5 mg via ORAL
  Filled 2023-05-23: qty 1

## 2023-05-23 MED ORDER — KETOROLAC TROMETHAMINE 15 MG/ML IJ SOLN
15.0000 mg | Freq: Once | INTRAMUSCULAR | Status: AC
  Administered 2023-05-23: 15 mg via INTRAVENOUS
  Filled 2023-05-23: qty 1

## 2023-05-23 MED ORDER — ALBUMIN HUMAN 25 % IV SOLN
INTRAVENOUS | Status: AC
  Filled 2023-05-23: qty 200

## 2023-05-23 MED ORDER — LIDOCAINE HCL (PF) 2 % IJ SOLN
10.0000 mL | Freq: Once | INTRAMUSCULAR | Status: AC
  Administered 2023-05-23: 10 mL
  Filled 2023-05-23: qty 10

## 2023-05-23 MED ORDER — SODIUM CHLORIDE 0.9 % IV SOLN
1.0000 g | Freq: Once | INTRAVENOUS | Status: AC
  Administered 2023-05-23: 1 g via INTRAVENOUS
  Filled 2023-05-23: qty 10

## 2023-05-23 MED ORDER — ALBUMIN HUMAN 25 % IV SOLN
50.0000 g | Freq: Once | INTRAVENOUS | Status: AC
  Administered 2023-05-23: 50 g via INTRAVENOUS

## 2023-05-23 MED ORDER — LIDOCAINE HCL (PF) 2 % IJ SOLN
INTRAMUSCULAR | Status: AC
  Filled 2023-05-23: qty 10

## 2023-05-23 NOTE — Progress Notes (Signed)
Patient tolerated left sided paracentesis procedure and 50G of IV albumin well today and 5 Liters of clear yellow ascites removed. Patient verbalized understanding of post procedure instructions and transported via stretcher at this time for xray that is ordered with no acute distress noted.

## 2023-05-23 NOTE — Plan of Care (Signed)
  Problem: Education: Goal: Knowledge of General Education information will improve Description: Including pain rating scale, medication(s)/side effects and non-pharmacologic comfort measures Outcome: Progressing   Problem: Health Behavior/Discharge Planning: Goal: Ability to manage health-related needs will improve Outcome: Progressing   Problem: Clinical Measurements: Goal: Respiratory complications will improve Outcome: Progressing   

## 2023-05-23 NOTE — Consult Note (Signed)
Consultation Note Date: 05/23/2023   Patient Name: Shawn Velazquez  DOB: 1966-05-31  MRN: [id number]  Age / Sex: 57 y.o., male  PCP: [name], MD Referring Physician: Shon Hale, MD  Reason for Consultation: Establishing goals of care  HPI/Patient Profile: 57 y.o. male  with past medical history of GERD, esophageal varices, alcoholic cirrhosis, obese  admitted on 05/18/2023 with acute GI bleed, hyponatremia/hypomagnesia him, hypokalemia, acute hypoxic respiratory failure with plan for repair today, alcoholic liver cirrhosis with ascites.   Clinical Assessment and Goals of Care: I have reviewed medical records including EPIC notes, labs and imaging, received report from RN, assessed the patient.  [name] is sitting up in the [place] chair in his room.  He appears chronically ill, and jaundice.  He greets me, making and somewhat keeping eye contact.  He is alert and oriented, able to make his needs known.  There is no family at bedside at this time.  We meet at the bedside to discuss diagnosis prognosis, GOC, EOL wishes, disposition and options.  I introduced Palliative Medicine as specialized medical care for people living with serious illness. It focuses on providing relief from the symptoms and stress of a serious illness. The goal is to improve quality of life for both the patient and the family.  We focused on their current illness.  We talk about [name]'s acute health concerns including, but not limited to his oxygen needs, his hemoglobin which has remained stable, left pleural effusion, plan for paracentesis today possible workup for TIPS.  Overall [name] is seems knowledgeable about his acute and chronic health concerns.  The natural disease trajectory and expectations at EOL were discussed.  CODE STATUS not discussed today secondary to relationship building  Palliative Care  services outpatient were explained and offered.  We talked about the effects of outpatient palliative services for continued support.  [name] is considering.  Discussed the importance of continued conversation with family and the medical providers regarding overall plan of care and treatment options, ensuring decisions are within the context of the patient's values and GOCs.  Questions and concerns were addressed.  The patient was encouraged to call with questions or concerns.  PMT will continue to support holistically.  Conference with attending, bedside nursing staff, transition of care team related to patient condition, needs, goals of care, disposition.   HCPOA HCPOA -[name] has completed documents naming his Sister Rivka Safer as his healthcare surrogate.  His significant other, [name], is second.    SUMMARY OF RECOMMENDATIONS   At this point continue full scope/full code Time for outcomes Prefers to return home. Considering outpatient palliative services Considering workup for TIPS   Code Status/Advance Care Planning: Full code  Symptom Management:  Per hospitalist, no additional needs  Palliative Prophylaxis:  Frequent Pain Assessment and Oral Care  Additional Recommendations (Limitations, Scope, Preferences): Full Scope Treatment  Psycho-social/Spiritual:  Desire for further Chaplaincy support:no Additional Recommendations: Caregiving  Support/Resources  Prognosis:  Unable to determine, based on outcomes.  6 months or less would not be surprising based on chronic illness burden.  If he is able to have a turnaround, several years may be possible.  Discharge Planning: Prefers home with home health if needed      Primary Diagnoses: Present on Admission:  GI bleed  Alcoholic cirrhosis of liver with ascites (HCC)  Hypokalemia  Hyponatremia  Macrocytic anemia  Uncontrolled type 2 diabetes mellitus with hyperglycemia (HCC)   I have reviewed the medical  record, interviewed the patient and family, and examined the patient. The following aspects are pertinent.  Past Medical History:  Diagnosis Date   Anxiety    Arthritis    affects hands, shoulder, neck, knees, hips, ankles, toes   Chronic back pain    Diabetes mellitus    diet controlled   Eczema    Esophageal varices (HCC)    Fatty liver, alcoholic    GERD (gastroesophageal reflux disease)    Glaucoma    slight case   Hypertension    Iron deficiency anemia due to chronic blood loss 11/25/2022   Psoriasis    Sleep apnea    Substance abuse (HCC)    Vertigo 09/11/2017   Wears glasses    Social History   Socioeconomic History   Marital status: Divorced    Spouse name: Not on file   Number of children: Not on file   Years of education: Not on file   Highest education level: Not on file  Occupational History    Comment: Truck to storage  Tobacco Use   Smoking status: Every Day    Current packs/day: 0.00    Average packs/day: 0.3 packs/day for 40.0 years (10.0 ttl pk-yrs)    Types: Cigarettes    Start date: 05/09/1982    Last attempt to quit: 05/09/2022    Years since quitting: 1.0   Smokeless tobacco: Never   Tobacco comments:    pt trying to quit on his own  Vaping Use   Vaping status: Former  Substance and Sexual Activity   Alcohol use: Not Currently    Alcohol/week: 12.0 standard drinks of alcohol    Types: 12 Cans of beer per week    Comment: last drink 2 beers on Sunday 02/20/2023   Drug use: No   Sexual activity: Yes    Birth control/protection: None  Other Topics Concern   Not on file  Social History Narrative   Not on file   Social Determinants of Health   Financial Resource Strain: Low Risk  (03/02/2023)   Overall Financial Resource Strain (CARDIA)    Difficulty of Paying Living Expenses: Not very hard  Food Insecurity: No Food Insecurity (05/18/2023)   Hunger Vital Sign    Worried About Running Out of Food in the Last Year: Never true    Ran Out of  Food in the Last Year: Never true  Transportation Needs: No Transportation Needs (05/18/2023)   PRAPARE - Administrator, Civil Service (Medical): No    Lack of Transportation (Non-Medical): No  Physical Activity: Not on file  Stress: Stress Concern Present (10/07/2022)   [employer] of Occupational Health - Occupational Stress Questionnaire    Feeling of Stress : To some extent  Social Connections: Not on file   Family History  Problem Relation Age of Onset   Diabetes Father    Cancer - Lung Father    Hypertension Other    Cancer - Lung Other    Scheduled Meds:  insulin aspart  0-15 Units Subcutaneous Q4H   lactulose  20 g Oral BID   pantoprazole (PROTONIX) IV  40 mg Intravenous Q12H   rifaximin  550 mg Oral BID   Continuous Infusions: PRN Meds:.ipratropium-albuterol, prochlorperazine Medications Prior to Admission:  Prior to Admission medications   Medication Sig Start Date End Date Taking? Authorizing Provider  albuterol (VENTOLIN HFA) 108 (90 Base) MCG/ACT inhaler Inhale 2 puffs into the lungs every 6 (six) hours as needed for wheezing or shortness of breath. 02/28/23  Yes Hongalgi, Maximino Greenland, MD  BELSOMRA 10 MG TABS Take 1 tablet by mouth daily. 04/19/23  Yes [provider]  Budeson-Glycopyrrol-Formoterol (BREZTRI AEROSPHERE) 160-9-4.8 MCG/ACT AERO Inhale 2 puffs into the lungs 2 (two) times daily.   Yes [provider]  busPIRone (BUSPAR) 5 MG tablet Take 1 tablet (5 mg total) by mouth daily as needed (anxiety). 02/28/23  Yes Hongalgi, Maximino Greenland, MD  feeding supplement (ENSURE ENLIVE / ENSURE PLUS) LIQD Take 237 mLs by mouth 2 (two) times daily between meals. 02/28/23  Yes Hongalgi, Maximino Greenland, MD  FLUoxetine (PROZAC) 10 MG capsule Take 1 capsule (10 mg total) by mouth 2 (two) times daily. Patient taking differently: Take 10-20 mg by mouth See admin instructions. Take 20 mg in the morning, 10 mg at night. 02/28/23  Yes Hongalgi, Maximino Greenland, MD  folic acid  (FOLVITE) 1 MG tablet TAKE 1 TABLET BY MOUTH DAILY 01/10/23  Yes Aida Raider, NP  furosemide (LASIX) 40 MG tablet Take 0.5 tablets (20 mg total) by mouth daily. 04/05/23 04/04/24 Yes Tyrone Nine, MD  gabapentin (NEURONTIN) 100 MG capsule Take 100 mg by mouth daily. 10/05/22  Yes [provider]  insulin glargine (LANTUS SOLOSTAR) 100 UNIT/ML Solostar Pen Inject 30 Units into the skin at bedtime. 12/23/22  Yes Reardon, Freddi Starr, NP  insulin lispro (HUMALOG KWIKPEN) 100 UNIT/ML KwikPen Inject 8-14 Units into the skin 3 (three) times daily. Patient taking differently: Inject 10-16 Units into the skin 3 (three) times daily before meals. 03/31/23  Yes Reardon, Freddi Starr, NP  lactulose (CHRONULAC) 10 GM/15ML solution Take 30 mLs (20 g total) by mouth 2 (two) times daily. Patient taking differently: Take 10 g by mouth 2 (two) times daily. 02/28/23  Yes Hongalgi, Maximino Greenland, MD  levocetirizine (XYZAL) 5 MG tablet Take 1 tablet by mouth daily. 10/05/22  Yes [provider]  MAGNESIUM PO Take 1 tablet by mouth daily. Magnesium Taurate   Yes [provider]  midodrine (PROAMATINE) 5 MG tablet Take 1 tablet (5 mg total) by mouth 3 (three) times daily with meals. To help Prevent Low Blood Pressure Patient taking differently: Take 5 mg by mouth with breakfast, with lunch, and with evening meal. To help Prevent Low Blood Pressure 12/08/22  Yes Emokpae, Courage, MD  Multiple Vitamins-Minerals (CENTRUM ADULT PO) Take 1 tablet by mouth daily.   Yes [provider]  ondansetron (ZOFRAN-ODT) 8 MG disintegrating tablet Take 8 mg by mouth every 8 (eight) hours as needed for nausea or vomiting.   Yes [provider]  pantoprazole (PROTONIX) 40 MG tablet Take 1 tablet (40 mg total) by mouth daily. 02/28/23 05/18/23 Yes Hongalgi, Maximino Greenland, MD  spironolactone (ALDACTONE) 50 MG tablet Take 1 tablet (50 mg total) by mouth daily. 04/05/23  Yes Tyrone Nine, MD  Thiamine HCl (VITAMIN B-1 PO)  Take 1 tablet by mouth daily.   Yes [provider]  Continuous Glucose Sensor (DEXCOM G7 SENSOR) MISC Inject 1 application  into the skin as directed. Change sensor every 10 days as directed. 12/23/22   Dani Gobble, NP  glucose blood test strip Use as instructed to monitor glucose 4 times daily 12/09/21   Dani Gobble, NP  Insulin Pen Needle (PEN NEEDLES) 32G X 4 MM MISC 1 each by Does not apply route in the morning, at noon, in the evening, and at bedtime. Use to inject insulin 4 times daily 05/12/22   Dani Gobble, NP   Allergies  Allergen Reactions   Neosporin + Pain Relief Max St [Neomy-Bacit-Polymyx-Pramoxine] Rash   Review of Systems  Unable to perform ROS: Other    Physical Exam Vitals and nursing note reviewed.     Vital Signs: BP 93/66 (BP Location: Left Arm)   Pulse 98   Temp 98.5 F (36.9 C) (Oral)   Resp 19   Ht 5\' 6"  (1.676 m)   Wt 91.9 kg   SpO2 95%   BMI 32.70 kg/m  Pain Scale: 0-10 POSS *See Group Information*: 1-Acceptable,Awake and alert Pain Score: 0-No pain   SpO2: SpO2: 95 % O2 Device:SpO2: 95 % O2 Flow Rate: .O2 Flow Rate (L/min): 2 L/min  IO: Intake/output summary:  Intake/Output Summary (Last 24 hours) at 05/23/2023 0847 Last data filed at 05/23/2023 0617 Gross per 24 hour  Intake 958 ml  Output 400 ml  Net 558 ml    LBM: Last BM Date : 05/22/23 Baseline Weight: Weight: 85.3 kg Most recent weight: Weight: 91.9 kg     Palliative Assessment/Data:     Time In: 1145    Time Out: 1240 Time Total: 40 minutes  Greater than 50%  of this time was spent counseling and coordinating care related to the above assessment and plan.  Signed by: Katheran Awe, NP   Please contact Palliative Medicine Team phone at [phone] for questions and concerns.  For individual provider: See Loretha Stapler

## 2023-05-23 NOTE — Progress Notes (Signed)
PROGRESS NOTE  Shawn Velazquez, is a 57 y.o. male, DOB - 1966-02-04, UJW:[id number]  Admit date - 05/18/2023   Admitting Physician Frankey Shown, DO  Outpatient Primary MD for the patient is [name], MD  LOS - 5  Chief Complaint  Patient presents with   GI Bleeding      Brief Narrative:  57 y.o. male with medical history significant of GERD, esophageal varices, alcoholic cirrhosis admitted on 05/18/2023 with concerns about GI bleed and electrolyte abnormalities    -Assessment and Plan: 1)Acute GI bleed -Discontinued IV octreotide on 05/20/2023 -Continue Protonix -EGD on 05/20/2023 shows--with 3 columns grade 1 esophageal varices improved from prior banding. , no bleeding and no stigmata of recent bleeding. Portal hypertensive gastropathy.- Normal duodenal bulb, first portion of the duodenum and second portion of the duodenum. -Hgb greater than 3 and stable at this time  2)Hyponatremia/Hypomagnesemia/Hypokalemia--in the setting of alcohol abuse and underlying liver cirrhosis --replace and recheck electrolytes  3) acute hypoxic respiratory failure--with significant left-sided effusion -On 05/20/2023 patient underwent left-sided thoracentesis with 1.5 L of blood-tinged pleural fluid removed -Pleural fluid analysis consistent with transudate per lights criteria 05/23/23 --Attempted to wean patient off oxygen but desaturated again--hypoxia improved patient current requiring 2 L of oxygen down from 6 L -SpO2 92% on 2L at rest SpO2 85% on 2L during ambulation SpO2 91% on 3L during ambulation SpO2 92% on 2L at rest after session  4)Alcoholic Liver cirrhosis with ascites--decompensated liver disease -Elevated LFTs noted pattern consistent with alcoholic liver disease -GI consult appreciated -Lactulose as ordered- --continue rifaximin -Rocephin x 5 days  for SBP prophylaxis -Continue midodrine -Patient had diagnostic paracentesis on 05/20/2023 with 100 mL removed -Plans  for therapeutic para on Monday, 05/23/2023 -Patient is being evaluated for possible TIPS by IR -Apparently was previously evaluated by the transplant team at [facility] and deemed not to be a candidate for liver transplant due to ongoing EtOH use -Per GI team not an ideal candidate for steroids at this time due to concerns about GI bleed -Plan for repeat therapeutic paracentesis on 05/23/2023  5) acute on chronic anemia---  baseline usually around 10 --Hgb greater than 3 and stable at this time -Monitor closely and transfuse as clinically indicated  6)Thrombocytopenia--due to underlying liver cirrhosis, -Platelets currently around 80K -Monitor closely and transfuse as clinically indicated  7)DM2- Use Novolog/Humalog Sliding scale insulin with Accu-Cheks/Fingersticks as ordered   8)Social/Ethics---overall poor prognosis given liver failure, patient not a candidate for transplant and ongoing alcohol use Palliative care consult requested for goals of care delineation - Status is: Inpatient   Disposition: The patient is from: Home              Anticipated d/c is to: Home              Anticipated d/c date is: 1 day              Patient currently is not medically stable to d/c. Barriers: Not Clinically Stable-   Code Status :  -  Code Status: Full Code   Family Communication:    NA (patient is alert, awake and coherent)   DVT Prophylaxis  :   - SCDs *  SCDs Start: 05/18/23 2248   Lab Results  Component Value Date   PLT 80 (L) 05/23/2023   Inpatient Medications Scheduled Meds:  insulin aspart  0-15 Units Subcutaneous Q4H   lactulose  20 g Oral BID   pantoprazole (PROTONIX) IV  40 mg Intravenous  Q12H   rifaximin  550 mg Oral BID   Continuous Infusions:  cefTRIAXone (ROCEPHIN)  IV     PRN Meds:.ipratropium-albuterol, prochlorperazine   Anti-infectives (From admission, onward)    Start     Dose/Rate Route Frequency Ordered Stop   05/23/23 1330  cefTRIAXone (ROCEPHIN) 1 g in  sodium chloride 0.9 % 100 mL IVPB        1 g 200 mL/hr over 30 Minutes Intravenous  Once 05/23/23 1238     05/21/23 1115  rifaximin (XIFAXAN) tablet 550 mg        550 mg Oral 2 times daily 05/21/23 1020     05/19/23 1030  cefTRIAXone (ROCEPHIN) 1 g in sodium chloride 0.9 % 100 mL IVPB        1 g 200 mL/hr over 30 Minutes Intravenous Every 24 hours 05/19/23 0942 05/22/23 0907   05/18/23 1915  cefTRIAXone (ROCEPHIN) 1 g in sodium chloride 0.9 % 100 mL IVPB        1 g 200 mL/hr over 30 Minutes Intravenous  Once 05/18/23 1911 05/18/23 2016      Subjective: Meribeth Mattes today has no fevers, no emesis,  No chest pain, -Hypoxia persist -Ambulating with mobility specialist with O2 desaturation with ambulation -Awaiting paracentesis and repeat chest x-ray  Objective: Vitals:   05/22/23 2100 05/23/23 0105 05/23/23 0342 05/23/23 0809  BP: 107/61 114/73 101/63 93/66  Pulse: 92  91 98  Resp: 18  18 19   Temp: 98.3 F (36.8 C)  98.1 F (36.7 C) 98.5 F (36.9 C)  TempSrc: Oral   Oral  SpO2: 98%  95% 95%  Weight:      Height:        Intake/Output Summary (Last 24 hours) at 05/23/2023 1240 Last data filed at 05/23/2023 0617 Gross per 24 hour  Intake 478 ml  Output 400 ml  Net 78 ml   Filed Weights   05/18/23 1857 05/19/23 0821 05/21/23 0459  Weight: 85.3 kg 90 kg 91.9 kg   Physical Exam Gen:- Awake Alert,  in no apparent distress  HEENT:- [place].AT,  +ve sclera icterus Nose-Fowler 2 L/min Neck-Supple Neck,No JVD,.  Lungs-improved air movement on the left after thoracentesis, no wheezing  CV- S1, S2 normal, regular  Abd-  +ve B.Sounds, Abd Soft, improved epigastric tenderness, small umbilical hernia noted, distention/ascites noted  Extremity/Skin:-Jaundice, pedal pulses present  Psych-affect is appropriate, oriented x3 Neuro-Generalized weakness, no new focal deficits, no tremors  Data Reviewed: I have personally reviewed following labs and imaging studies  CBC: Recent Labs   Lab 05/18/23 1944 05/19/23 0415 05/20/23 0501 05/21/23 0450 05/22/23 0415 05/23/23 0414  WBC 9.2 10.4 12.8* 10.5 10.8* 11.0*  NEUTROABS 6.1  --  9.1*  --   --   --   HGB 9.5* 8.4* 8.2* 8.2* 8.3* 8.3*  HCT 25.5* 24.7* 24.1* 24.7* 25.2* 25.4*  MCV 111.8* 110.8* 112.1* 115.4* 115.6* 114.4*  PLT 94* 74* 94* 86* 91* 80*   Basic Metabolic Panel: Recent Labs  Lab 05/18/23 1944 05/19/23 0415 05/20/23 0501 05/21/23 0450 05/23/23 0414  NA 129* 132* 127* 131* 126*  K 2.9* 3.4* 3.7 3.8 3.7  CL 86* 90* 88* 92* 90*  CO2 32 30 29 30 27   GLUCOSE 163* 106* 127* 205* 170*  BUN 11 13 14 12 10   CREATININE 0.79 0.73 0.78 0.72 0.86  CALCIUM 8.1* 7.8* 7.9* 8.0* 8.0*  MG 1.5*  --   --   --   --  GFR: Estimated Creatinine Clearance: 98.1 mL/min (by C-G formula based on SCr of 0.86 mg/dL). Liver Function Tests: Recent Labs  Lab 05/18/23 1944 05/19/23 0415 05/20/23 0501 05/21/23 0450 05/23/23 0414  AST 116* 98* 95* 105* 122*  ALT 40 37 36 32 38  ALKPHOS 139* 111 115 107 131*  BILITOT 17.8* 16.3* 17.4* 18.3* 16.6*  PROT 5.8* 4.9* 5.4* 5.1* 5.2*  ALBUMIN 2.4* 2.0* 2.1* 2.1* 2.0*   Recent Results (from the past 240 hour(s))  MRSA Next Gen by PCR, Nasal     Status: None   Collection Time: 05/19/23 12:13 AM   Specimen: Nasal Mucosa; Nasal Swab  Result Value Ref Range Status   MRSA by PCR Next Gen NOT DETECTED NOT DETECTED Final    Comment: (NOTE) The GeneXpert MRSA Assay (FDA approved for NASAL specimens only), is one component of a comprehensive MRSA colonization surveillance program. It is not intended to diagnose MRSA infection nor to guide or monitor treatment for MRSA infections. Test performance is not FDA approved in patients less than 57 years old. Performed at [facility], [address], [place], [place] [address]   Gram stain     Status: None   Collection Time: 05/20/23 11:32 AM   Specimen: Pleura  Result Value Ref Range Status   Specimen Description PLEURAL LEFT   Final   Special Requests NONE  Final   Gram Stain   Final    WBC PRESENT,BOTH PMN AND MONONUCLEAR NO ORGANISMS SEEN CYTOSPIN SMEAR Performed at [facility], [address] [place], [place] [address]    Report Status 05/20/2023 FINAL  Final  Culture, body fluid w Gram Stain-bottle     Status: None (Preliminary result)   Collection Time: 05/20/23 11:32 AM   Specimen: Pleura  Result Value Ref Range Status   Specimen Description PLEURAL LEFT  Final   Special Requests 10CC  Final   Culture   Final    NO GROWTH 3 DAYS Performed at [facility], [address], [place], [place] [address]    Report Status PENDING  Incomplete  Culture, body fluid w Gram Stain-bottle     Status: None (Preliminary result)   Collection Time: 05/20/23 11:45 AM   Specimen: Ascitic  Result Value Ref Range Status   Specimen Description ASCITIC  Final   Special Requests 10CC BOTTLES DRAWN AEROBIC AND ANAEROBIC  Final   Culture   Final    NO GROWTH 3 DAYS Performed at [facility], [address] [place], [place] [address]    Report Status PENDING  Incomplete  Gram stain     Status: None   Collection Time: 05/20/23 11:45 AM   Specimen: Ascitic  Result Value Ref Range Status   Specimen Description ASCITIC  Final   Special Requests ASCITIC  Final   Gram Stain   Final    WBC PRESENT,BOTH PMN AND MONONUCLEAR NO ORGANISMS SEEN CYTOSPIN SMEAR Performed at [facility], [address] [place], [place] [address]    Report Status 05/20/2023 FINAL  Final    Scheduled Meds:  insulin aspart  0-15 Units Subcutaneous Q4H   lactulose  20 g Oral BID   pantoprazole (PROTONIX) IV  40 mg Intravenous Q12H   rifaximin  550 mg Oral BID   Continuous Infusions:  cefTRIAXone (ROCEPHIN)  IV      LOS: 5 days   Shon Hale M.D on 05/23/2023 at 12:40 PM  Go to [email or web address] - for contact info  [facility] - Office  [phone]  If 7PM-7AM, please contact night-coverage [email or web address] 05/23/2023, 12:40 PM

## 2023-05-23 NOTE — Progress Notes (Addendum)
Patient c/o pain in L lower side. Pain 7/10. Stabbing description. Adefeso, DO made aware. Received one time order for Toradol. IV medication given.

## 2023-05-23 NOTE — Progress Notes (Signed)
Mobility Specialist Progress Note:    05/23/23 1130  Mobility  Activity Ambulated with assistance in hallway  Level of Assistance Standby assist, set-up cues, supervision of patient - no hands on  Assistive Device None  Distance Ambulated (ft) 40 ft  Range of Motion/Exercises Active;All extremities  Activity Response Tolerated well  Mobility Referral Yes  $Mobility charge 1 Mobility  Mobility Specialist Start Time (ACUTE ONLY) 1120  Mobility Specialist Stop Time (ACUTE ONLY) 1130  Mobility Specialist Time Calculation (min) (ACUTE ONLY) 10 min   Pt received in chair, agreeable to mobility. Required SBA to stand and ambulate with no AD. Tolerated well, see O2 sats below. Returned pt to room, left in chair. Call bell in reach, all needs met.  SpO2 92% on 2L at rest SpO2 85% on 2L during ambulation SpO2 91% on 3L during ambulation SpO2 92% on 2L at rest after session  Lawerance Bach Mobility Specialist Please contact via SecureChat or  Rehab office at [phone]

## 2023-05-23 NOTE — Progress Notes (Signed)
Gastroenterology Progress Note    Patient ID: Shawn Velazquez; [id number]; 1966-03-21    Subjective   Sitting up in chair. Abdomen feels tense. No mental status changes or confusion. Tolerating diet. [place] para planned for today.    Objective   Vital signs in last 24 hours Temp:  [98.1 F (36.7 C)-98.5 F (36.9 C)] 98.5 F (36.9 C) (11/18 0809) Pulse Rate:  [91-98] 98 (11/18 0809) Resp:  [18-19] 19 (11/18 0809) BP: (83-114)/(57-73) 93/66 (11/18 0809) SpO2:  [94 %-98 %] 95 % (11/18 0809) Last BM Date : 05/22/23  Physical Exam General:   Alert and oriented, pleasant, sallow-appearing, sitting up in chair Head:  Normocephalic and atraumatic. Eyes:  scleral icterus Abdomen:  Bowel sounds present, distended and tense, small umbilical hernia, sitting up in chair Extremities:  With 2+ edema to thigh Neurologic:  Alert and  oriented x4; negative asterixis  Psych:  Alert and cooperative. Normal mood and affect.  Intake/Output from previous day: 11/17 0701 - 11/18 0700 In: 958 [P.O.:958] Out: 400 [Urine:400] Intake/Output this shift: No intake/output data recorded.  Lab Results  Recent Labs    05/21/23 0450 05/22/23 0415 05/23/23 0414  WBC 10.5 10.8* 11.0*  HGB 8.2* 8.3* 8.3*  HCT 24.7* 25.2* 25.4*  PLT 86* 91* 80*   BMET Recent Labs    05/21/23 0450 05/23/23 0414  NA 131* 126*  K 3.8 3.7  CL 92* 90*  CO2 30 27  GLUCOSE 205* 170*  BUN 12 10  CREATININE 0.72 0.86  CALCIUM 8.0* 8.0*   LFT Recent Labs    05/21/23 0450 05/23/23 0414  PROT 5.1* 5.2*  ALBUMIN 2.1* 2.0*  AST 105* 122*  ALT 32 38  ALKPHOS 107 131*  BILITOT 18.3* 16.6*   PT/INR Recent Labs    05/21/23 0450 05/23/23 0414  LABPROT 27.1* 28.5*  INR 2.5* 2.7*    Studies/Results US Paracentesis  Result Date: 05/20/2023 INDICATION: Patient with a history of alcoholic cirrhosis with recurrent ascites. Diagnostic paracentesis requested. EXAM: ULTRASOUND GUIDED PARACENTESIS  MEDICATIONS: 1% lidocaine 10 ml. COMPLICATIONS: None immediate. PROCEDURE: Informed written consent was obtained from the patient after a discussion of the risks, benefits and alternatives to treatment. A timeout was performed prior to the initiation of the procedure. Initial ultrasound scanning demonstrates a large amount of ascites within the right lower abdominal quadrant. The right lower abdomen was prepped and draped in the usual sterile fashion. 1% lidocaine was used for local anesthesia. Following this, a 19 gauge, 7-cm, Yueh catheter was introduced. An ultrasound image was saved for documentation purposes. The paracentesis was performed. The catheter was removed and a dressing was applied. The patient tolerated the procedure well without immediate post procedural complication. A total of approximately 100 ml of clear yellow fluid was removed. Samples were sent to the laboratory as requested by the clinical team. IMPRESSION: Successful ultrasound-guided paracentesis yielding 100 ml of peritoneal fluid. Procedure performed by Alwyn Ren, NP PLAN: The patient has previously been formally evaluated by the [facility] and is being actively followed for potential future intervention. ** Pending clinic evaluation to discuss TIPS. Electronically Signed   By: Shawn Velazquez M.D.   On: 05/20/2023 12:36   DG Chest Port 1 View  Result Date: 05/20/2023 CLINICAL DATA:  57 year old male status post left thoracentesis. EXAM: PORTABLE CHEST - 1 VIEW COMPARISON:  05/18/2023 FINDINGS: The mediastinal contours are within normal limits. No cardiomegaly. No evidence of significant left pleural effusion or  evidence of left pneumothorax after thoracentesis. Low lung volumes bilaterally. No new focal consolidation. No acute osseous abnormality. IMPRESSION: No evidence of significant left pleural effusion or pneumothorax after thoracentesis. Electronically Signed   By: Shawn Velazquez M.D.   On: 05/20/2023 12:28   US THORACENTESIS ASP PLEURAL SPACE W/IMG GUIDE  Result Date: 05/20/2023 INDICATION: Patient with a history of cirrhosis found to have left pleural effusion. Diagnostic and therapeutic thoracentesis requested. EXAM: ULTRASOUND GUIDED THORACENTESIS MEDICATIONS: 1% lidocaine 10 ml . COMPLICATIONS: None immediate. PROCEDURE: An ultrasound guided thoracentesis was thoroughly discussed with the patient and questions answered. The benefits, risks, alternatives and complications were also discussed. The patient understands and wishes to proceed with the procedure. Written consent was obtained. Ultrasound was performed to localize and mark an adequate pocket of fluid in the left chest. The area was then prepped and draped in the normal sterile fashion. 1% Lidocaine was used for local anesthesia. Under ultrasound guidance a 6 Fr Safe-T-Centesis catheter was introduced. Thoracentesis was performed. The catheter was removed and a dressing applied. FINDINGS: A total of approximately 1.5 L of blood-tinged fluid was removed. Samples were sent to the laboratory as requested by the clinical team. IMPRESSION: Successful ultrasound guided left thoracentesis yielding 1.5 L of pleural fluid. Procedure performed by Alwyn Ren, NP Electronically Signed   By: Shawn Velazquez M.D.   On: 05/20/2023 12:26   DG Chest Portable 1 View  Result Date: 05/18/2023 CLINICAL DATA:  Shortness of breath EXAM: PORTABLE CHEST 1 VIEW COMPARISON:  04/02/2023 FINDINGS: Moderate left-sided effusion is increased compared to prior. Probable small right effusion. Airspace disease at the left base. Stable cardiomediastinal silhouette. Underlying emphysema and bronchitic changes. IMPRESSION: Moderate left-sided effusion, increased compared to prior. Probable small right effusion. Airspace disease at the left base, probable atelectasis. Emphysema Electronically Signed   By: Jasmine Pang M.D.   On: 05/18/2023 21:08     Assessment  57 y.o. male with a history of alcohol abuse, alcoholic hepatitis, decompensated alcoholic cirrhosis complicated by portal hypertension, ascites, hydrothorax, hepatic encephalopathy , not transplant candidate, prior variceal bleeds, presenting this admission with N/V, abdominal distension, and rectal bleeding,.   EGD on 05/20/23 with 3 columns Grade 1 esophageal varices improved from prior EGD but no active bleeding or stigmata of bleeding.  Colonoscopy on file a year ago without rectal varices and surveillance needed in 2026. Hgb remaining stable, and no overt GI bleeding.   Decompensated cirrhosis: MELD 3.0 is 32 today. DF is elevated greater than 32 (87.9) but not candidate for steroids in light of bleeding. Will need one final dose of Rocephin IV today to complete full course of empiric antibiotics in setting of GI bleeding. Tense abdomen today with [place] para ordered already. He has lower extremity edema/anasarca but is not on diuretics in light of hypotension and hyponatremia. Remains with coagulopathy despite Vit K IV (last Nov 16th). Overall poor prognosis and not transplant candidate at last visit in July 2024 with Dr. Hamilton Capri (continued alcohol following variceal bleeding, questionable insight, cigarette smoking). Appreciate palliative care. If he were to have further decline, encephalopathy, etc., could reach out to [facility] and update to ensure all bases have been covered.   Plan / Recommendations  Last dose of IV Rocephin today [place] para as ordered Continue lactulose and Xifaxan  Appreciate palliative care involvement Absolute ETOH cessation Outpatient cirrhosis care    LOS: 5 days    05/23/2023, 9:36 AM  Gelene Mink, PhD, ANP-BC [facility]

## 2023-05-24 ENCOUNTER — Encounter: Payer: Self-pay | Admitting: Hematology

## 2023-05-24 ENCOUNTER — Telehealth: Payer: Self-pay | Admitting: Gastroenterology

## 2023-05-24 ENCOUNTER — Other Ambulatory Visit (HOSPITAL_COMMUNITY): Payer: Self-pay

## 2023-05-24 DIAGNOSIS — I959 Hypotension, unspecified: Secondary | ICD-10-CM

## 2023-05-24 DIAGNOSIS — K922 Gastrointestinal hemorrhage, unspecified: Secondary | ICD-10-CM | POA: Diagnosis not present

## 2023-05-24 DIAGNOSIS — K7040 Alcoholic hepatic failure without coma: Secondary | ICD-10-CM | POA: Diagnosis not present

## 2023-05-24 DIAGNOSIS — R140 Abdominal distension (gaseous): Secondary | ICD-10-CM | POA: Diagnosis not present

## 2023-05-24 DIAGNOSIS — K625 Hemorrhage of anus and rectum: Secondary | ICD-10-CM | POA: Diagnosis not present

## 2023-05-24 DIAGNOSIS — J9601 Acute respiratory failure with hypoxia: Secondary | ICD-10-CM | POA: Diagnosis not present

## 2023-05-24 DIAGNOSIS — Z7189 Other specified counseling: Secondary | ICD-10-CM | POA: Diagnosis not present

## 2023-05-24 DIAGNOSIS — R600 Localized edema: Secondary | ICD-10-CM | POA: Diagnosis not present

## 2023-05-24 DIAGNOSIS — K7011 Alcoholic hepatitis with ascites: Secondary | ICD-10-CM | POA: Diagnosis not present

## 2023-05-24 DIAGNOSIS — Z515 Encounter for palliative care: Secondary | ICD-10-CM | POA: Diagnosis not present

## 2023-05-24 DIAGNOSIS — R791 Abnormal coagulation profile: Secondary | ICD-10-CM

## 2023-05-24 LAB — CBC
HCT: 24.8 % — ABNORMAL LOW (ref 39.0–52.0)
Hemoglobin: 8.4 g/dL — ABNORMAL LOW (ref 13.0–17.0)
MCH: 38.5 pg — ABNORMAL HIGH (ref 26.0–34.0)
MCHC: 33.9 g/dL (ref 30.0–36.0)
MCV: 113.8 fL — ABNORMAL HIGH (ref 80.0–100.0)
Platelets: 82 10*3/uL — ABNORMAL LOW (ref 150–400)
RBC: 2.18 MIL/uL — ABNORMAL LOW (ref 4.22–5.81)
RDW: 14.9 % (ref 11.5–15.5)
WBC: 10.7 10*3/uL — ABNORMAL HIGH (ref 4.0–10.5)
nRBC: 0 % (ref 0.0–0.2)

## 2023-05-24 LAB — PROTIME-INR
INR: 2.7 — ABNORMAL HIGH (ref 0.8–1.2)
Prothrombin Time: 29 s — ABNORMAL HIGH (ref 11.4–15.2)

## 2023-05-24 LAB — COMPREHENSIVE METABOLIC PANEL
ALT: 36 U/L (ref 0–44)
AST: 112 U/L — ABNORMAL HIGH (ref 15–41)
Albumin: 2.6 g/dL — ABNORMAL LOW (ref 3.5–5.0)
Alkaline Phosphatase: 131 U/L — ABNORMAL HIGH (ref 38–126)
Anion gap: 10 (ref 5–15)
BUN: 9 mg/dL (ref 6–20)
CO2: 28 mmol/L (ref 22–32)
Calcium: 8.3 mg/dL — ABNORMAL LOW (ref 8.9–10.3)
Chloride: 89 mmol/L — ABNORMAL LOW (ref 98–111)
Creatinine, Ser: 0.64 mg/dL (ref 0.61–1.24)
GFR, Estimated: >60 mL/min (ref >60)
Glucose, Bld: 225 mg/dL — ABNORMAL HIGH (ref 70–99)
Potassium: 3.8 mmol/L (ref 3.5–5.1)
Sodium: 127 mmol/L — ABNORMAL LOW (ref 135–145)
Total Bilirubin: 17.8 mg/dL — ABNORMAL HIGH (ref <1.2)
Total Protein: 5.4 g/dL — ABNORMAL LOW (ref 6.5–8.1)

## 2023-05-24 LAB — GLUCOSE, CAPILLARY
Glucose-Capillary: 168 mg/dL — ABNORMAL HIGH (ref 70–99)
Glucose-Capillary: 188 mg/dL — ABNORMAL HIGH (ref 70–99)
Glucose-Capillary: 199 mg/dL — ABNORMAL HIGH (ref 70–99)
Glucose-Capillary: 212 mg/dL — ABNORMAL HIGH (ref 70–99)
Glucose-Capillary: 334 mg/dL — ABNORMAL HIGH (ref 70–99)

## 2023-05-24 MED ORDER — SPIRONOLACTONE 25 MG PO TABS
50.0000 mg | ORAL_TABLET | Freq: Every day | ORAL | Status: DC
  Administered 2023-05-24: 50 mg via ORAL
  Filled 2023-05-24: qty 2

## 2023-05-24 MED ORDER — PANTOPRAZOLE SODIUM 40 MG PO TBEC: 40.0000 mg | DELAYED_RELEASE_TABLET | Freq: Two times a day (BID) | ORAL | 3 refills | Status: DC

## 2023-05-24 MED ORDER — FLUOXETINE HCL 10 MG PO CAPS: 10.0000 mg | ORAL_CAPSULE | ORAL | Status: DC

## 2023-05-24 MED ORDER — FOLIC ACID 1 MG PO TABS: 1.0000 mg | ORAL_TABLET | Freq: Every day | ORAL | 3 refills | Status: DC

## 2023-05-24 MED ORDER — BUSPIRONE HCL 5 MG PO TABS: 5.0000 mg | ORAL_TABLET | Freq: Every day | ORAL | 3 refills | Status: DC | PRN

## 2023-05-24 MED ORDER — VITAMIN B-1 100 MG PO TABS: 100.0000 mg | ORAL_TABLET | Freq: Every day | ORAL | 3 refills | Status: DC

## 2023-05-24 MED ORDER — FUROSEMIDE 10 MG/ML IJ SOLN
40.0000 mg | Freq: Once | INTRAMUSCULAR | Status: AC
  Administered 2023-05-24: 40 mg via INTRAVENOUS
  Filled 2023-05-24: qty 4

## 2023-05-24 MED ORDER — ALBUTEROL SULFATE HFA 108 (90 BASE) MCG/ACT IN AERS: 2.0000 | INHALATION_SPRAY | Freq: Four times a day (QID) | RESPIRATORY_TRACT | 2 refills | Status: DC | PRN

## 2023-05-24 MED ORDER — BREZTRI AEROSPHERE 160-9-4.8 MCG/ACT IN AERO: 2.0000 | INHALATION_SPRAY | Freq: Two times a day (BID) | RESPIRATORY_TRACT | 3 refills | Status: DC

## 2023-05-24 MED ORDER — RIFAXIMIN 550 MG PO TABS: 550.0000 mg | ORAL_TABLET | Freq: Two times a day (BID) | ORAL | 3 refills | Status: DC

## 2023-05-24 MED ORDER — FUROSEMIDE 40 MG PO TABS: 20.0000 mg | ORAL_TABLET | Freq: Every day | ORAL | 3 refills | Status: DC

## 2023-05-24 MED ORDER — LANTUS SOLOSTAR 100 UNIT/ML ~~LOC~~ SOPN: 20.0000 [IU] | PEN_INJECTOR | Freq: Every day | SUBCUTANEOUS | 3 refills | Status: DC

## 2023-05-24 MED ORDER — MIDODRINE HCL 5 MG PO TABS: 5.0000 mg | ORAL_TABLET | Freq: Three times a day (TID) | ORAL | 2 refills | Status: DC

## 2023-05-24 MED ORDER — LACTULOSE 10 GM/15ML PO SOLN: 20.0000 g | Freq: Two times a day (BID) | ORAL | 2 refills | Status: DC

## 2023-05-24 NOTE — Progress Notes (Signed)
    SATURATION QUALIFICATIONS: (This note is used to comply with regulatory documentation for home oxygen)   Patient Saturations on Room Air at Rest = 92 %   Patient Saturations on Room Air while Ambulating = 85 %   Patient Saturations on 2 Liters of oxygen while Ambulating = 93 %     SpO2 92% on RA at rest SpO2 85% on RA during ambulation SpO2 93% on 2L during ambulation   Patient needs continuous O2 at 2 L/min continuously via nasal cannula with humidifier, with gaseous portability and conserving device   Dx-- COPD  Shon Hale, MD

## 2023-05-24 NOTE — Plan of Care (Signed)
  Problem: Education: Goal: Knowledge of General Education information will improve Description: Including pain rating scale, medication(s)/side effects and non-pharmacologic comfort measures Outcome: Adequate for Discharge   Problem: Health Behavior/Discharge Planning: Goal: Ability to manage health-related needs will improve Outcome: Adequate for Discharge   Problem: Clinical Measurements: Goal: Respiratory complications will improve Outcome: Adequate for Discharge   Problem: Education: Goal: Knowledge of General Education information will improve Description: Including pain rating scale, medication(s)/side effects and non-pharmacologic comfort measures Outcome: Adequate for Discharge   Problem: Health Behavior/Discharge Planning: Goal: Ability to manage health-related needs will improve Outcome: Adequate for Discharge   Problem: Clinical Measurements: Goal: Respiratory complications will improve Outcome: Adequate for Discharge

## 2023-05-24 NOTE — Progress Notes (Signed)
Patient discharged with instructions given on medications and follow up visits,patient and family verbalized understanding. Prescriptions sent to Pharmacy of choice documented on AVS. IV  discontinued,catheter intact. Accompanied by staff to an awaiting vehicle. 

## 2023-05-24 NOTE — Progress Notes (Signed)
Palliative:  Mr. Connerly is lying quietly in bed.  He appears acutely/chronically ill and frail, jaundiced.  He greets me, making and mostly keeping eye contact. He is alert and oriented, able to make his needs known.  GI NP is present at bedside.   We briefly talk about his acute health issues and plan for DC home today.  I share that he will be checked for O2 needs, and will have oxygen if needed/qualified.  No questions at this time.    Plan:  Continue to treat the treatable.  Follow up with GI outpatient. Possible workup for TIPS.  Encouraged out patient palliative.   25 minutes   Lillia Carmel, NP Palliative Medicine Team  Team phone [phone]

## 2023-05-24 NOTE — Discharge Summary (Signed)
Shawn Velazquez, is a 57 y.o. male  DOB 01-09-1966  MRN [id number].  Admission date:  05/18/2023  Admitting Physician  Frankey Shown, DO  Discharge Date:  05/24/2023   Primary MD  [name], MD  Recommendations for primary care physician for things to follow:  1)Avoid ibuprofen/Advil/Aleve/Motrin/Goody Powders/Naproxen/BC powders/Meloxicam/Diclofenac/Indomethacin and other Nonsteroidal anti-inflammatory medications as these will make you more likely to bleed and can cause stomach ulcers, can also cause Kidney problems.   2)Repeat CMP, CBC and PT/INR Blood Tests every Friday for next 3 weeks   3)Repeat paracentesis procedure to remove fluid from your belly every other Friday--Dr. Marletta Lor from GI service can arrange this for you  4) left-sided thoracentesis to remove fluid from your left chest area--every couple of weeks as needed--Dr. Dwana Melena or Dr. Marletta Lor can arrange this for you  5) complete abstinence from alcohol advised--consider outpatient alcohol rehab program  6)you need oxygen at home at 2 L via nasal cannula continuously while awake and while asleep--- smoking or having open fires around oxygen can cause fire, significant injury and death  Admission Diagnosis  GI bleed [K92.2]   Discharge Diagnosis  GI bleed [K92.2]   Principal Problem:   GI bleed Active Problems:   Hyponatremia   Alcoholic cirrhosis of liver with ascites (HCC)   Uncontrolled type 2 diabetes mellitus with hyperglycemia (HCC)   Hypokalemia   Macrocytic anemia   Acute respiratory failure with hypoxia (HCC)   Hypomagnesemia   Hypoalbuminemia due to protein-calorie malnutrition (HCC)   Obesity (BMI 30-39.9)      Past Medical History:  Diagnosis Date   Anxiety    Arthritis    affects hands, shoulder, neck, knees, hips, ankles, toes   Chronic back pain    Diabetes mellitus    diet controlled   Eczema    Esophageal  varices (HCC)    Fatty liver, alcoholic    GERD (gastroesophageal reflux disease)    Glaucoma    slight case   Hypertension    Iron deficiency anemia due to chronic blood loss 11/25/2022   Psoriasis    Sleep apnea    Substance abuse (HCC)    Vertigo 09/11/2017   Wears glasses     Past Surgical History:  Procedure Laterality Date   COLONOSCOPY WITH PROPOFOL N/A 04/05/2022   Procedure: COLONOSCOPY WITH PROPOFOL;  Surgeon: Lanelle Bal, DO;  [facility];  Service: Endoscopy;  Laterality: N/A;  10:45 AM,unable to reach pt to move up   ESOPHAGEAL BANDING N/A 08/26/2022   Procedure: ESOPHAGEAL BANDING;  Surgeon: Lanelle Bal, DO;  [facility];  Service: Endoscopy;  Laterality: N/A;   ESOPHAGEAL BANDING  09/24/2022   Procedure: ESOPHAGEAL BANDING;  Surgeon: Corbin Ade, MD;  [facility];  Service: Endoscopy;;   ESOPHAGEAL BANDING  02/22/2023   Procedure: ESOPHAGEAL BANDING;  Surgeon: Marguerita Merles, Reuel Boom, MD;  [facility];  Service: Gastroenterology;;   ESOPHAGOGASTRODUODENOSCOPY (EGD) WITH PROPOFOL N/A 08/26/2022   Procedure: ESOPHAGOGASTRODUODENOSCOPY (EGD) WITH PROPOFOL;  Surgeon: Lanelle Bal, DO;  [facility];  Service: Endoscopy;  Laterality: N/A;   ESOPHAGOGASTRODUODENOSCOPY (EGD) WITH PROPOFOL N/A 09/24/2022   Procedure: ESOPHAGOGASTRODUODENOSCOPY (EGD) WITH PROPOFOL;  Surgeon: Corbin Ade, MD;  [facility];  Service: Endoscopy;  Laterality: N/A;  hematemesis, esophageal varices, melena   ESOPHAGOGASTRODUODENOSCOPY (EGD) WITH PROPOFOL N/A 02/22/2023   Procedure: ESOPHAGOGASTRODUODENOSCOPY (EGD) WITH PROPOFOL;  Surgeon: Dolores Frame, MD;  [facility];  Service: Gastroenterology;  Laterality: N/A;   MULTIPLE TOOTH EXTRACTIONS     POLYPECTOMY  04/05/2022   Procedure: POLYPECTOMY;  Surgeon: Lanelle Bal, DO;  [facility];  Service: Endoscopy;;   POSTERIOR  CERVICAL LAMINECTOMY  04/21/2012   Procedure: POSTERIOR CERVICAL LAMINECTOMY;  Surgeon: Mariam Dollar, MD;  [facility];  Service: Neurosurgery;  Laterality: Left;  Posterior Cervical laminectomy/foraminotomy and diskectomy, left cervical seven-thoracic one   ROTATOR CUFF REPAIR     THORACIC DISCECTOMY Left 04/03/2014   Procedure: Left Thoracic Seven to Eight, Thoracic Eight to Nine Thoracic Discectomy ;  Surgeon: Mariam Dollar, MD;  [facility]: [facility];  Service: Neurosurgery;  Laterality: Left;       HPI  from the history and physical done on the day of admission:   HPI: Shawn Velazquez is a 57 y.o. male with medical history significant of GERD, esophageal varices, alcoholic cirrhosis who presents to the emergency department due to bleeding in the rectum.  Patient complained of 3-4-day onset of nausea and nonbloody vomiting as well as increased abdominal bloating.  Vomiting was 7-8 episodes daily.  He had about 3 -12 ounce beers yesterday, today, he complained of having blood in his stool which started this morning and he endorsed about 3 episodes already, so he activated EMS.  On arrival of EMS, patient was noted to be hypoxic with an O2 sat with 3% on room air, supplemental oxygen via [place] was provided at 3 LPM and patient was sent to the ED for further evaluation and management.  Patient states that he has been compliant with his lactulose.  He states that he does not shake or avoid withdrawal symptoms if he does not drink alcohol.   ED Course:  In the ED, pulse was 105 bpm, other vital signs were within normal range; O2 sat was 97% on supplemental oxygen via [place] at 3 LPM (patient does not use oxygen at baseline).  Workup in the ED showed macrocytic anemia and thrombocytopenia.  BMP showed sodium 139, potassium 2.9, chloride 86, bicarb 32, blood glucose 163, BUN 11, creatinine 0.79, Albumin 2.4 AST 116, ALT 40, alkaline phosphatase 139, total bilirubin 17.8, magnesium 1.5.  FOBT was  positive Chest x-ray showed moderate  left-sided effusion, increased compared to prior. Probable small right effusion. Airspace disease at the left base, probable atelectasis. Emphysema Patient was treated with IV ceftriaxone, octreotide drip, IV Protonix. Gastroenterologist (Dr. Jena Gauss) was consulted and recommended admitting patient and place him n.p.o. at midnight.  Hospitalist was asked to admit patient for further evaluation and management.   Review of Systems: Review of systems as noted in the HPI. All other systems reviewed and are negative.     Hospital Course:   Brief Narrative:  57 y.o. male with medical history significant of GERD, esophageal varices, alcoholic cirrhosis admitted on 05/18/2023 with concerns about GI bleed and electrolyte abnormalities     -Assessment and Plan: 1)Acute GI bleed -Discontinued IV octreotide on 05/20/2023 -Continue Protonix -EGD on 05/20/2023  shows--with 3 columns grade 1 esophageal varices improved from prior banding. , no bleeding and no stigmata of recent bleeding. Portal hypertensive gastropathy.- Normal duodenal bulb, first portion of the duodenum and second portion of the duodenum. -Hgb greater than 8 and stable at this time -GI team she is okay to discharge home with outpatient follow-up -Avoid NSAIDs -Repeat CBC and PT/INR every Friday for the next 3 weeks   2)Hyponatremia/Hypomagnesemia/Hypokalemia--in the setting of alcohol abuse and underlying liver cirrhosis Potassium and magnesium replaced -Repeat CMP every Friday for the next 3 weeks as outpatient   3) acute hypoxic respiratory failure--with significant left-sided effusion -On 05/20/2023 patient underwent left-sided thoracentesis with 1.5 L of blood-tinged pleural fluid removed -Pleural fluid analysis consistent with transudate per lights criteria --Attempted to wean patient off oxygen but desaturated again--hypoxia improved patient current requiring 2 L of oxygen down from 6  L -SpO2 92% on 2L at rest SpO2 85% on 2L during ambulation SpO2 91% on 3L during ambulation SpO2 92% on 2L at rest after session -Discharge home on home O2 at 2 L via nasal cannula continuously   4)Alcoholic Liver cirrhosis with ascites--decompensated liver disease---patient is not encephalopathic currently -Elevated LFTs noted pattern consistent with alcoholic liver disease -GI consult appreciated -Continue lactulose- --continue rifaximin -Treated with IV Rocephin for SBP prophylaxis -Continue midodrine -Patient had diagnostic paracentesis on 05/20/2023 with 100 mL removed -Status post for therapeutic/palliative para on Monday, 05/23/2023 -Patient is being evaluated for possible TIPS by IR -Apparently was previously evaluated by the transplant team at [facility] and deemed not to be a candidate for liver transplant due to ongoing EtOH use -Per GI team not an ideal candidate for steroids at this time due to concerns about GI bleed -Patient will require outpatient paracentesis from time to time to be arranged by GI team   5) acute on chronic anemia---  baseline usually around 10 --Hgb greater than 8 and stable at this time -Repeat CBC and PT/INR every Friday for the next 3 weeks   6)Thrombocytopenia--due to underlying liver cirrhosis, -Platelets currently around 80K -Repeat CBC every Friday for the next 3 weeks as advised   7)DM2- resume PTA insulin regimen   8)Social/Ethics---overall poor prognosis given liver failure, patient not a candidate for transplant and ongoing alcohol use Palliative care consult requested for goals of care delineation -Plan of care discussed with patient's sister and patient's significant other/live-in partner   Disposition: The patient is from: Home              Anticipated d/c is to: Home  Discharge Condition: Stable, overall prognosis is poor  Follow UP   Follow-up Information     [facility] Follow up.   Why: Home  Oxygen Contact information: [address] [address] [phone]         Lanelle Bal, DO. Schedule an appointment as soon as possible for a visit in 1 week(s).   Specialty: Gastroenterology Contact information: [address] [place] [address] [phone]                  Consults obtained -GI  Diet and Activity recommendation:  As advised  Discharge Instructions    Discharge Instructions     Call MD for:  difficulty breathing, headache or visual disturbances   Complete by: As directed    Call MD for:  persistant dizziness or light-headedness   Complete by: As directed    Call MD for:  persistant  nausea and vomiting   Complete by: As directed    Call MD for:  severe uncontrolled pain   Complete by: As directed    Call MD for:  temperature >100.4   Complete by: As directed    Diet Carb Modified   Complete by: As directed    Discharge instructions   Complete by: As directed    1)Avoid ibuprofen/Advil/Aleve/Motrin/Goody Powders/Naproxen/BC powders/Meloxicam/Diclofenac/Indomethacin and other Nonsteroidal anti-inflammatory medications as these will make you more likely to bleed and can cause stomach ulcers, can also cause Kidney problems.   2)Repeat CMP, CBC and PT/INR Blood Tests every Friday for next 3 weeks   3)Repeat paracentesis procedure to remove fluid from your belly every other Friday--Dr. Marletta Lor from GI service can arrange this for you  4) left-sided thoracentesis to remove fluid from your left chest area--every couple of weeks as needed--Dr. Dwana Melena or Dr. Marletta Lor can arrange this for you  5) complete abstinence from alcohol advised--consider outpatient alcohol rehab program  6)you need oxygen at home at 2 L via nasal cannula continuously while awake and while asleep--- smoking or having open fires around oxygen can cause fire, significant injury and death   Increase activity slowly   Complete by: As directed        Discharge  Medications     Allergies as of 05/24/2023       Reactions   Neosporin + Pain Relief Max St [neomy-bacit-polymyx-pramoxine] Rash        Medication List     TAKE these medications    albuterol 108 (90 Base) MCG/ACT inhaler Commonly known as: VENTOLIN HFA Inhale 2 puffs into the lungs every 6 (six) hours as needed for wheezing or shortness of breath.   Belsomra 10 MG Tabs Generic drug: Suvorexant Take 1 tablet by mouth daily.   Breztri Aerosphere 160-9-4.8 MCG/ACT Aero Generic drug: Budeson-Glycopyrrol-Formoterol Inhale 2 puffs into the lungs 2 (two) times daily.   busPIRone 5 MG tablet Commonly known as: BUSPAR Take 1 tablet (5 mg total) by mouth daily as needed (anxiety).   CENTRUM ADULT PO Take 1 tablet by mouth daily.   Dexcom G7 Sensor Misc Inject 1 application  into the skin as directed. Change sensor every 10 days as directed.   feeding supplement Liqd Take 237 mLs by mouth 2 (two) times daily between meals.   FLUoxetine 10 MG capsule Commonly known as: PROZAC Take 1-2 capsules (10-20 mg total) by mouth See admin instructions. Take 20 mg in the morning, 10 mg at night.   folic acid 1 MG tablet Commonly known as: FOLVITE Take 1 tablet (1 mg total) by mouth daily.   furosemide 40 MG tablet Commonly known as: Lasix Take 0.5 tablets (20 mg total) by mouth daily.   gabapentin 100 MG capsule Commonly known as: NEURONTIN Take 100 mg by mouth daily.   glucose blood test strip Use as instructed to monitor glucose 4 times daily   insulin lispro 100 UNIT/ML KwikPen Commonly known as: HumaLOG KwikPen Inject 8-14 Units into the skin 3 (three) times daily. What changed:  how much to take when to take this   lactulose 10 GM/15ML solution Commonly known as: CHRONULAC Take 30 mLs (20 g total) by mouth 2 (two) times daily. What changed: how much to take   Lantus SoloStar 100 UNIT/ML Solostar Pen Generic drug: insulin glargine Inject 20 Units into the skin  at bedtime. What changed: how much to take   levocetirizine 5 MG tablet Commonly known as:  XYZAL Take 1 tablet by mouth daily.   MAGNESIUM PO Take 1 tablet by mouth daily. Magnesium Taurate   midodrine 5 MG tablet Commonly known as: PROAMATINE Take 1 tablet (5 mg total) by mouth with breakfast, with lunch, and with evening meal. To help Prevent Low Blood Pressure   ondansetron 8 MG disintegrating tablet Commonly known as: ZOFRAN-ODT Take 8 mg by mouth every 8 (eight) hours as needed for nausea or vomiting.   pantoprazole 40 MG tablet Commonly known as: Protonix Take 1 tablet (40 mg total) by mouth 2 (two) times daily. What changed: when to take this   Pen Needles 32G X 4 MM Misc 1 each by Does not apply route in the morning, at noon, in the evening, and at bedtime. Use to inject insulin 4 times daily   rifaximin 550 MG Tabs tablet Commonly known as: XIFAXAN Take 1 tablet (550 mg total) by mouth 2 (two) times daily.   spironolactone 50 MG tablet Commonly known as: ALDACTONE Take 1 tablet (50 mg total) by mouth daily.   thiamine 100 MG tablet Commonly known as: Vitamin B-1 Take 1 tablet (100 mg total) by mouth daily. What changed:  medication strength how much to take               Durable Medical Equipment  (From admission, onward)           Start     Ordered   05/24/23 1256  For home use only DME oxygen  Once       Comments: SATURATION QUALIFICATIONS: (This note is used to comply with regulatory documentation for home oxygen)   Patient Saturations on Room Air at Rest = 92 %   Patient Saturations on Room Air while Ambulating = 85 %   Patient Saturations on 2 Liters of oxygen while Ambulating = 93 %     SpO2 92% on RA at rest SpO2 85% on RA during ambulation SpO2 93% on 2L during ambulation   Patient needs continuous O2 at 2 L/min continuously via nasal cannula with humidifier, with gaseous portability and conserving device   Dx-- COPD  Question  Answer Comment  Length of Need Lifetime   Mode or (Route) Nasal cannula   Liters per Minute 2   Frequency Continuous (stationary and portable oxygen unit needed)   Oxygen conserving device Yes   Oxygen delivery system Gas      05/24/23 1255            Major procedures and Radiology Reports - PLEASE review detailed and final reports for all details, in brief -   DG Chest 2 View  Result Date: 05/23/2023 CLINICAL DATA:  Shortness of breath. EXAM: CHEST - 2 VIEW COMPARISON:  05/20/2023; 05/18/2023 FINDINGS: Grossly unchanged cardiac silhouette and mediastinal contours given patient rotation. Interval recurrence of small to moderate-sized left-sided pleural effusion. Pulmonary vasculature remains indistinct with cephalization of flow. No definite right-sided pleural effusion. No pneumothorax. No acute osseous abnormalities. IMPRESSION: Findings suggestive of pulmonary edema with interval recurrence of small to moderate-sized left-sided pleural effusion. Electronically Signed   By: Simonne Come M.D.   On: 05/23/2023 17:04   US Paracentesis  Result Date: 05/23/2023 INDICATION: History of alcoholic cirrhosis, now with recurrent symptomatic ascites. Please perform ultrasound-guided paracentesis for diagnostic purposes. Max volume of 5 L. EXAM: ULTRASOUND-GUIDED PARACENTESIS COMPARISON:  Ultrasound-guided paracentesis performed 05/20/2023 MEDICATIONS: None. COMPLICATIONS: None immediate. TECHNIQUE: Informed written consent was obtained from the patient after a discussion of the risks,  benefits and alternatives to treatment. A timeout was performed prior to the initiation of the procedure. Initial ultrasound scanning demonstrates a large amount of ascites within the left lower abdomen which was subsequently prepped and draped in the usual sterile fashion. 1% lidocaine with epinephrine was used for local anesthesia. An ultrasound image was saved for documentation purposed. An 8 Fr Safe-T-Centesis  catheter was introduced. The paracentesis was performed. The catheter was removed and a dressing was applied. The patient tolerated the procedure well without immediate post procedural complication. FINDINGS: A total of approximately 5 liters of serous fluid was removed. IMPRESSION: Successful ultrasound-guided paracentesis yielding 5 liters of peritoneal fluid. Electronically Signed   By: Simonne Come M.D.   On: 05/23/2023 17:03   US Paracentesis  Result Date: 05/20/2023 INDICATION: Patient with a history of alcoholic cirrhosis with recurrent ascites. Diagnostic paracentesis requested. EXAM: ULTRASOUND GUIDED PARACENTESIS MEDICATIONS: 1% lidocaine 10 ml. COMPLICATIONS: None immediate. PROCEDURE: Informed written consent was obtained from the patient after a discussion of the risks, benefits and alternatives to treatment. A timeout was performed prior to the initiation of the procedure. Initial ultrasound scanning demonstrates a large amount of ascites within the right lower abdominal quadrant. The right lower abdomen was prepped and draped in the usual sterile fashion. 1% lidocaine was used for local anesthesia. Following this, a 19 gauge, 7-cm, Yueh catheter was introduced. An ultrasound image was saved for documentation purposes. The paracentesis was performed. The catheter was removed and a dressing was applied. The patient tolerated the procedure well without immediate post procedural complication. A total of approximately 100 ml of clear yellow fluid was removed. Samples were sent to the laboratory as requested by the clinical team. IMPRESSION: Successful ultrasound-guided paracentesis yielding 100 ml of peritoneal fluid. Procedure performed by Alwyn Ren, NP PLAN: The patient has previously been formally evaluated by the [facility] and is being actively followed for potential future intervention. ** Pending clinic evaluation to discuss TIPS.  Electronically Signed   By: Marliss Coots M.D.   On: 05/20/2023 12:36   DG Chest Port 1 View  Result Date: 05/20/2023 CLINICAL DATA:  57 year old male status post left thoracentesis. EXAM: PORTABLE CHEST - 1 VIEW COMPARISON:  05/18/2023 FINDINGS: The mediastinal contours are within normal limits. No cardiomegaly. No evidence of significant left pleural effusion or evidence of left pneumothorax after thoracentesis. Low lung volumes bilaterally. No new focal consolidation. No acute osseous abnormality. IMPRESSION: No evidence of significant left pleural effusion or pneumothorax after thoracentesis. Electronically Signed   By: Marliss Coots M.D.   On: 05/20/2023 12:28   US THORACENTESIS ASP PLEURAL SPACE W/IMG GUIDE  Result Date: 05/20/2023 INDICATION: Patient with a history of cirrhosis found to have left pleural effusion. Diagnostic and therapeutic thoracentesis requested. EXAM: ULTRASOUND GUIDED THORACENTESIS MEDICATIONS: 1% lidocaine 10 ml . COMPLICATIONS: None immediate. PROCEDURE: An ultrasound guided thoracentesis was thoroughly discussed with the patient and questions answered. The benefits, risks, alternatives and complications were also discussed. The patient understands and wishes to proceed with the procedure. Written consent was obtained. Ultrasound was performed to localize and mark an adequate pocket of fluid in the left chest. The area was then prepped and draped in the normal sterile fashion. 1% Lidocaine was used for local anesthesia. Under ultrasound guidance a 6 Fr Safe-T-Centesis catheter was introduced. Thoracentesis was performed. The catheter was removed and a dressing applied. FINDINGS: A total of approximately 1.5 L of blood-tinged fluid was removed. Samples were sent to the  laboratory as requested by the clinical team. IMPRESSION: Successful ultrasound guided left thoracentesis yielding 1.5 L of pleural fluid. Procedure performed by Alwyn Ren, NP Electronically Signed   By:  Marliss Coots M.D.   On: 05/20/2023 12:26   DG Chest Portable 1 View  Result Date: 05/18/2023 CLINICAL DATA:  Shortness of breath EXAM: PORTABLE CHEST 1 VIEW COMPARISON:  04/02/2023 FINDINGS: Moderate left-sided effusion is increased compared to prior. Probable small right effusion. Airspace disease at the left base. Stable cardiomediastinal silhouette. Underlying emphysema and bronchitic changes. IMPRESSION: Moderate left-sided effusion, increased compared to prior. Probable small right effusion. Airspace disease at the left base, probable atelectasis. Emphysema Electronically Signed   By: Jasmine Pang M.D.   On: 05/18/2023 21:08    Micro Results   Recent Results (from the past 240 hour(s))  MRSA Next Gen by PCR, Nasal     Status: None   Collection Time: 05/19/23 12:13 AM   Specimen: Nasal Mucosa; Nasal Swab  Result Value Ref Range Status   MRSA by PCR Next Gen NOT DETECTED NOT DETECTED Final    Comment: (NOTE) The GeneXpert MRSA Assay (FDA approved for NASAL specimens only), is one component of a comprehensive MRSA colonization surveillance program. It is not intended to diagnose MRSA infection nor to guide or monitor treatment for MRSA infections. Test performance is not FDA approved in patients less than 73 years old. Performed at [facility], [address], [place], [place] [address]   Gram stain     Status: None   Collection Time: 05/20/23 11:32 AM   Specimen: Pleura  Result Value Ref Range Status   Specimen Description PLEURAL LEFT  Final   Special Requests NONE  Final   Gram Stain   Final    WBC PRESENT,BOTH PMN AND MONONUCLEAR NO ORGANISMS SEEN CYTOSPIN SMEAR Performed at [facility], [address], [place], [place] [address]    Report Status 05/20/2023 FINAL  Final  Culture, body fluid w Gram Stain-bottle     Status: None (Preliminary result)   Collection Time: 05/20/23 11:32 AM   Specimen: Pleura  Result Value Ref Range Status   Specimen Description PLEURAL LEFT   Final   Special Requests 10CC  Final   Culture   Final    NO GROWTH 4 DAYS Performed at [facility], [address], [place], [place] [address]    Report Status PENDING  Incomplete  Culture, body fluid w Gram Stain-bottle     Status: None (Preliminary result)   Collection Time: 05/20/23 11:45 AM   Specimen: Ascitic  Result Value Ref Range Status   Specimen Description ASCITIC  Final   Special Requests 10CC BOTTLES DRAWN AEROBIC AND ANAEROBIC  Final   Culture   Final    NO GROWTH 4 DAYS Performed at [facility], [address] [place], [place] [address]    Report Status PENDING  Incomplete  Gram stain     Status: None   Collection Time: 05/20/23 11:45 AM   Specimen: Ascitic  Result Value Ref Range Status   Specimen Description ASCITIC  Final   Special Requests ASCITIC  Final   Gram Stain   Final    WBC PRESENT,BOTH PMN AND MONONUCLEAR NO ORGANISMS SEEN CYTOSPIN SMEAR Performed at [facility], [address] [place], [place] [address]    Report Status 05/20/2023 FINAL  Final    Today   Subjective    Shawn Velazquez today has no new complaints -Eating and drinking well -No vomiting  No fever  Or chills  -Had mushy stool   Patient has been seen and examined prior to discharge   Objective   Blood pressure (!) 106/58, pulse 90, temperature 98.4 F (36.9 C), temperature source Oral, resp. rate 18, height 5\' 6"  (1.676 m), weight 91.9 kg, SpO2 100%.   Intake/Output Summary (Last 24 hours) at 05/24/2023 1617 Last data filed at 05/24/2023 1300 Gross per 24 hour  Intake 1000 ml  Output 400 ml  Net 600 ml    Exam Gen:- Awake Alert,  in no apparent distress  HEENT:- [place].AT,  +ve sclera icterus Nose-[place] 2 L/min Neck-Supple Neck,No JVD,.  Lungs-improved air movement on the left after thoracentesis, no wheezing  CV- S1, S2 normal, regular  Abd-  +ve B.Sounds, Abd Soft,-NT, small umbilical hernia noted, much less distended after paracentesis Extremity/Skin:-Jaundice,  pedal pulses present  Psych-affect is appropriate, oriented x3 Neuro-Generalized weakness, no new focal deficits, no tremors   Data Review   CBC w Diff:  Lab Results  Component Value Date   WBC 10.7 (H) 05/24/2023   HGB 8.4 (L) 05/24/2023   HGB 14.4 07/31/2021   HCT 24.8 (L) 05/24/2023   HCT 42.4 07/31/2021   PLT 82 (L) 05/24/2023   PLT 146 (L) 07/31/2021   LYMPHOPCT 10 05/20/2023   MONOPCT 8 05/20/2023   EOSPCT 11 05/20/2023   BASOPCT 0 05/20/2023    CMP:  Lab Results  Component Value Date   NA 127 (L) 05/24/2023   NA 135 11/05/2022   K 3.8 05/24/2023   CL 89 (L) 05/24/2023   CO2 28 05/24/2023   BUN 9 05/24/2023   BUN 5 (L) 11/05/2022   CREATININE 0.64 05/24/2023   CREATININE 0.76 03/23/2023   PROT 5.4 (L) 05/24/2023   PROT 6.5 11/05/2022   ALBUMIN 2.6 (L) 05/24/2023   ALBUMIN 3.1 (L) 11/05/2022   BILITOT 17.8 (H) 05/24/2023   BILITOT 3.3 (H) 11/05/2022   ALKPHOS 131 (H) 05/24/2023   AST 112 (H) 05/24/2023   ALT 36 05/24/2023  .  Total Discharge time is about 33 minutes  Shon Hale M.D on 05/24/2023 at 4:17 PM  Go to [email or web address] -  for contact info  [facility] - Office  [phone]

## 2023-05-24 NOTE — TOC Transition Note (Signed)
Transition of Care [facility]) - CM/SW Discharge Note   Patient Details  Name: Shawn Velazquez MRN: [id number] Date of Birth: [birth year]  [facility]) CM/SW Contact:  Leitha Bleak, RN Phone Number: 05/24/2023, 1:18 PM   Clinical Narrative:   Patient discharging home, needing home oxygen. Orders and note are in the chart. CMS choices given. Referral sent to [facility] with [employer], he will deliver to the room. Team updated, added info to AVS.     Final next level of care: Home/Self Care Barriers to Discharge: Barriers Resolved   Patient Goals and CMS Choice CMS Medicare.gov Compare Post Acute Care list provided to:: Patient Choice offered to / list presented to : Patient  Discharge Placement        Patient and family notified of of transfer: 05/24/23  Discharge Plan and Services Additional resources added to the After Visit Summary for                  DME Arranged: Oxygen DME Agency: [employer] Date DME Agency Contacted: 05/24/23 Time DME Agency Contacted: 1318 Representative spoke with at DME Agency: Ian Malkin     Social Determinants of Health (SDOH) Interventions SDOH Screenings   Food Insecurity: No Food Insecurity (05/18/2023)  Housing: Low Risk  (05/18/2023)  Transportation Needs: No Transportation Needs (05/18/2023)  Utilities: Not At Risk (05/18/2023)  Depression (PHQ2-9): Medium Risk (03/02/2023)  Financial Resource Strain: Low Risk  (03/02/2023)  Stress: Stress Concern Present (10/07/2022)  Tobacco Use: High Risk (05/23/2023)  Health Literacy: Adequate Health Literacy (03/02/2023)     Readmission Risk Interventions    05/19/2023    4:21 PM 09/27/2022   11:21 AM 08/26/2022   11:17 AM  Readmission Risk Prevention Plan  Transportation Screening Complete Complete Complete  Home Care Screening   Complete  Medication Review (RN CM)   Complete  HRI or Home Care Consult  Complete   Social Work Consult for Recovery Care Planning/Counseling  Complete    Palliative Care Screening  Not Applicable   Medication Review Oceanographer) Complete Complete   PCP or Specialist appointment within 3-5 days of discharge Not Complete    HRI or Home Care Consult Complete    SW Recovery Care/Counseling Consult Complete    Palliative Care Screening Not Applicable    Skilled Nursing Facility Not Applicable

## 2023-05-24 NOTE — Telephone Encounter (Signed)
Patient needs hospital follow-up in 1 week.

## 2023-05-24 NOTE — Discharge Instructions (Signed)
1)Avoid ibuprofen/Advil/Aleve/Motrin/Goody Powders/Naproxen/BC powders/Meloxicam/Diclofenac/Indomethacin and other Nonsteroidal anti-inflammatory medications as these will make you more likely to bleed and can cause stomach ulcers, can also cause Kidney problems.   2)Repeat CMP, CBC and PT/INR Blood Tests every Friday for next 3 weeks   3)Repeat paracentesis procedure to remove fluid from your belly every other Friday--Dr. Marletta Lor from GI service can arrange this for you  4) left-sided thoracentesis to remove fluid from your left chest area--every couple of weeks as needed--Dr. Dwana Melena or Dr. Marletta Lor can arrange this for you  5) complete abstinence from alcohol advised--consider outpatient alcohol rehab program  6)you need oxygen at home at 2 L via nasal cannula continuously while awake and while asleep--- smoking or having open fires around oxygen can cause fire, significant injury and death

## 2023-05-24 NOTE — Progress Notes (Signed)
Mobility Specialist Progress Note:    05/24/23 1216  Mobility  Activity Ambulated with assistance in hallway  Level of Assistance Standby assist, set-up cues, supervision of patient - no hands on  Assistive Device None  Distance Ambulated (ft) 125 ft  Range of Motion/Exercises Active;All extremities  Activity Response Tolerated well  Mobility Referral Yes  $Mobility charge 1 Mobility  Mobility Specialist Start Time (ACUTE ONLY) 1140  Mobility Specialist Stop Time (ACUTE ONLY) 1155  Mobility Specialist Time Calculation (min) (ACUTE ONLY) 15 min   Pt received in bed, agreeable to mobility. Required SBA to stand and ambulate with no AD. Tolerated well, see O2 sats below. Returned pt to room, left in chair. Family in room, all needs met.  SpO2 92% on RA at rest SpO2 85% on RA during ambulation SpO2 93% on 2L during ambulation  Lawerance Bach Mobility Specialist Please contact via SecureChat or  Rehab office at [phone]

## 2023-05-24 NOTE — Progress Notes (Signed)
Subjective: Reports feeling better today. Paracentesis helped significantly. Abdomen is much softer. No abdominal pain. No nausea, vomiting, brbpr, melena, confusion/disorientation. Continues with LE edema since admission. States he didn't have this at home. No significant SOB on 2.5 L [place].   Hoping to go home today.   Objective: Vital signs in last 24 hours: Temp:  [98.1 F (36.7 C)-99 F (37.2 C)] 99 F (37.2 C) (11/19 0823) Pulse Rate:  [85-103] 90 (11/19 0823) Resp:  [16-20] 18 (11/19 0823) BP: (97-124)/(63-81) 106/68 (11/19 0823) SpO2:  [93 %-99 %] 99 % (11/19 0823) Last BM Date : 05/23/23 General:   Alert and oriented, pleasant, jaundiced.  Head:  Normocephalic and atraumatic. Eyes:  with scleral icterus.  Abdomen:  Bowel sounds present, soft, non-tender, non-distended. No HSM or hernias noted. No rebound or guarding. No masses appreciated.  Msk:  Symmetrical without gross deformities. Normal posture. Extremities:  With pitting edema up to the hips.  Neurologic:  Alert and  oriented x4;  grossly normal neurologically. No asterixis.  Skin:  Warm and dry, intact without significant lesions.  Psych:   Normal mood and affect.  Intake/Output from previous day: 11/18 0701 - 11/19 0700 In: 1110 [P.O.:960; IV Piggyback:150] Out: 200 [Urine:200] Intake/Output this shift: Total I/O In: 400 [P.O.:400] Out: 200 [Urine:200]  Lab Results: Recent Labs    05/22/23 0415 05/23/23 0414 05/24/23 0418  WBC 10.8* 11.0* 10.7*  HGB 8.3* 8.3* 8.4*  HCT 25.2* 25.4* 24.8*  PLT 91* 80* 82*   BMET Recent Labs    05/23/23 0414 05/24/23 0418  NA 126* 127*  K 3.7 3.8  CL 90* 89*  CO2 27 28  GLUCOSE 170* 225*  BUN 10 9  CREATININE 0.86 0.64  CALCIUM 8.0* 8.3*   LFT Recent Labs    05/23/23 0414 05/24/23 0418  PROT 5.2* 5.4*  ALBUMIN 2.0* 2.6*  AST 122* 112*  ALT 38 36  ALKPHOS 131* 131*  BILITOT 16.6* 17.8*   PT/INR Recent Labs    05/23/23 0414 05/24/23 0418   LABPROT 28.5* 29.0*  INR 2.7* 2.7*    Studies/Results: DG Chest 2 View  Result Date: 05/23/2023 CLINICAL DATA:  Shortness of breath. EXAM: CHEST - 2 VIEW COMPARISON:  05/20/2023; 05/18/2023 FINDINGS: Grossly unchanged cardiac silhouette and mediastinal contours given patient rotation. Interval recurrence of small to moderate-sized left-sided pleural effusion. Pulmonary vasculature remains indistinct with cephalization of flow. No definite right-sided pleural effusion. No pneumothorax. No acute osseous abnormalities. IMPRESSION: Findings suggestive of pulmonary edema with interval recurrence of small to moderate-sized left-sided pleural effusion. Electronically Signed   By: Simonne Come M.D.   On: 05/23/2023 17:04   US Paracentesis  Result Date: 05/23/2023 INDICATION: History of alcoholic cirrhosis, now with recurrent symptomatic ascites. Please perform ultrasound-guided paracentesis for diagnostic purposes. Max volume of 5 L. EXAM: ULTRASOUND-GUIDED PARACENTESIS COMPARISON:  Ultrasound-guided paracentesis performed 05/20/2023 MEDICATIONS: None. COMPLICATIONS: None immediate. TECHNIQUE: Informed written consent was obtained from the patient after a discussion of the risks, benefits and alternatives to treatment. A timeout was performed prior to the initiation of the procedure. Initial ultrasound scanning demonstrates a large amount of ascites within the left lower abdomen which was subsequently prepped and draped in the usual sterile fashion. 1% lidocaine with epinephrine was used for local anesthesia. An ultrasound image was saved for documentation purposed. An 8 Fr Safe-T-Centesis catheter was introduced. The paracentesis was performed. The catheter was removed and a dressing was applied. The patient tolerated the procedure well without  immediate post procedural complication. FINDINGS: A total of approximately 5 liters of serous fluid was removed. IMPRESSION: Successful ultrasound-guided paracentesis  yielding 5 liters of peritoneal fluid. Electronically Signed   By: Simonne Come M.D.   On: 05/23/2023 17:03    Assessment: 57 y.o. male with a history of alcohol abuse, alcoholic hepatitis, decompensated alcoholic cirrhosis complicated by portal hypertension, ascites, hydrothorax, hepatic encephalopathy , not transplant candidate, prior variceal bleeds, presenting this admission with N/V, abdominal distension, and rectal bleeding.   Rectal bleeding:  No overt GI bleeding since admission. EGD on 05/20/23 with 3 columns Grade 1 esophageal varices improved from prior EGD but no active bleeding or stigmata of bleeding. Colonoscopy on file a year ago without rectal varices and surveillance needed in 2026. Hgb was 9.5 on admission (down from 11.5). Hgb declined to 8 range but has been stable since 11/14. While etiology of bleeding not identified, no plans for further inpatient endoscopic evaluation as Hgb remains stable. He has completed course of IV rocephin yesterday for SBP prophylaxis.   Decompensated cirrhosis:  MELD 3.0 is 31 today.  Not a transplant candidate according to last visit with Dr. Hamilton Capri (continued alcohol following variceal bleeding, questionable insight, cigarette smoking).   Presented with worsening abdominal distention with concern for ascites.  Initial paracentesis 11/15 yielding a 100 mL of peritoneal fluid.  Developed tense ascites during admission and underwent paracentesis yesterday yielding 5 L.  Notably, he has also developed significant lower extremity pitting edema up to his hips during admission as diuretics have been placed on hold due to hypotension and hyponatremia.  BP remains soft.  Could consider midodrine for blood pressure support to allow for diuretics, but this would not help with hyponatremia which is persistent, sodium 127 today.   He remains with coagulopathy in the setting of thrombocytopenia and elevated INR of 2.7.  He did receive IV vitamin K this admission with  no real improvement.  He has no evidence of encephalopathy on lactulose and Xifaxan.   Overall, poor prognosis.  Palliative care has met with patient this admission.   Alcoholic hepatitis:  DF is elevated greater than 32 (91.4) but we have not recommended steroids in light of GI bleeding and prior non compliance. Bilirubin is fluctuating, but fairly stable since 11/13, peaked at 18.3 on 11/16 and is 17.8 today.  We discussed the importance of absolute alcohol cessation today.    Plan: Continue lactulose and Xifaxan.  Absolute ETOH cessation  Unable to resume diuretics at this time.  Appreciate palliative care involvement  Outpatient cirrhosis care. As patient is planning for discharge today, will arrange 1 week follow-up with repeat labs. Can consider resuming diuretics if Na improved and BP stable.     LOS: 6 days    05/24/2023, 11:11 AM   Ermalinda Memos, [facility]

## 2023-05-25 LAB — CULTURE, BODY FLUID W GRAM STAIN -BOTTLE
Culture: NO GROWTH
Culture: NO GROWTH

## 2023-05-26 ENCOUNTER — Encounter (HOSPITAL_COMMUNITY): Payer: Self-pay | Admitting: Internal Medicine

## 2023-05-27 ENCOUNTER — Encounter (HOSPITAL_COMMUNITY): Payer: Self-pay

## 2023-05-27 ENCOUNTER — Telehealth: Payer: Self-pay | Admitting: Gastroenterology

## 2023-05-27 ENCOUNTER — Ambulatory Visit (HOSPITAL_COMMUNITY): Admission: RE | Admit: 2023-05-27 | Payer: BC Managed Care – PPO | Source: Ambulatory Visit

## 2023-05-27 ENCOUNTER — Other Ambulatory Visit: Payer: Self-pay | Admitting: Gastroenterology

## 2023-05-27 ENCOUNTER — Ambulatory Visit (HOSPITAL_COMMUNITY)
Admission: RE | Admit: 2023-05-27 | Discharge: 2023-05-27 | Disposition: A | Discharge status: Home / Self Care | Payer: BC Managed Care – PPO | Source: Ambulatory Visit | Attending: Internal Medicine | Admitting: Internal Medicine

## 2023-05-27 DIAGNOSIS — K7031 Alcoholic cirrhosis of liver with ascites: Secondary | ICD-10-CM | POA: Diagnosis present

## 2023-05-27 DIAGNOSIS — J90 Pleural effusion, not elsewhere classified: Secondary | ICD-10-CM

## 2023-05-27 DIAGNOSIS — J45909 Unspecified asthma, uncomplicated: Secondary | ICD-10-CM

## 2023-05-27 LAB — BODY FLUID CELL COUNT WITH DIFFERENTIAL
Eos, Fluid: 2 %
Lymphs, Fluid: 50 %
Monocyte-Macrophage-Serous Fluid: 44 % — ABNORMAL LOW (ref 50–90)
Neutrophil Count, Fluid: 4 % (ref 0–25)
Total Nucleated Cell Count, Fluid: 79 uL (ref 0–1000)

## 2023-05-27 LAB — GRAM STAIN: Gram Stain: NONE SEEN

## 2023-05-27 MED ORDER — LIDOCAINE HCL (PF) 2 % IJ SOLN
10.0000 mL | Freq: Once | INTRAMUSCULAR | Status: AC
  Administered 2023-05-27: 10 mL

## 2023-05-27 NOTE — Telephone Encounter (Signed)
Referral placed.

## 2023-05-27 NOTE — Addendum Note (Signed)
Addended by: Armstead Peaks on: 05/27/2023 10:17 AM   Modules accepted: Orders

## 2023-05-27 NOTE — Progress Notes (Signed)
Patient tolerated left sided paracentesis procedure well today and 4 Liters of clear yellow ascites removed with labs collected and sent for processing. Patient verbalized understanding of discharge instructions and had no acute distress noted at departure.

## 2023-05-27 NOTE — Telephone Encounter (Signed)
Please send an urgent referral to pulmonology for patient given recurrent pleural effusions and need for intermittent thoracenteses. DX: pleural effusions, asthma  Brooke Bonito, MSN, APRN, FNP-BC, AGACNP-BC [facility] at [facility]

## 2023-05-30 ENCOUNTER — Inpatient Hospital Stay: Payer: BC Managed Care – PPO | Attending: Hematology

## 2023-05-30 DIAGNOSIS — D649 Anemia, unspecified: Secondary | ICD-10-CM | POA: Diagnosis present

## 2023-05-30 DIAGNOSIS — D696 Thrombocytopenia, unspecified: Secondary | ICD-10-CM

## 2023-05-30 DIAGNOSIS — D539 Nutritional anemia, unspecified: Secondary | ICD-10-CM

## 2023-05-30 LAB — SAMPLE TO BLOOD BANK

## 2023-05-30 LAB — CBC
HCT: 32.5 % — ABNORMAL LOW (ref 39.0–52.0)
Hemoglobin: 11.2 g/dL — ABNORMAL LOW (ref 13.0–17.0)
MCH: 38.9 pg — ABNORMAL HIGH (ref 26.0–34.0)
MCHC: 34.5 g/dL (ref 30.0–36.0)
MCV: 112.8 fL — ABNORMAL HIGH (ref 80.0–100.0)
Platelets: 149 10*3/uL — ABNORMAL LOW (ref 150–400)
RBC: 2.88 MIL/uL — ABNORMAL LOW (ref 4.22–5.81)
RDW: 13.9 % (ref 11.5–15.5)
WBC: 13.6 10*3/uL — ABNORMAL HIGH (ref 4.0–10.5)
nRBC: 0 % (ref 0.0–0.2)

## 2023-05-31 NOTE — Progress Notes (Unsigned)
GI Office Note    Referring Provider: [name], MD Primary Care Physician:  [name], MD Primary Gastroenterologist: Hennie Duos. Marletta Lor, DO  Date:  06/01/2023  ID:  [name], DOB Nov 06, 1965, MRN [id number]  Chief Complaint   Chief Complaint  Patient presents with   Follow-up    Hospital follow up.    History of Present Illness  Shawn Velazquez is a 57 y.o. male with a history of alcohol abuse with alcoholic hepatitis as well as decompensated alcoholic cirrhosis complicated by portal hypertension, esophageal variceal bleeding, ascites, hydrothorax, and HE and not currently a transplant candidate presenting today for hospital follow-up.  He also has a history of GERD, anemia, diabetes, and IDA.  History of hypertension however given decompensated liver disease he recently has been hypotensive requiring midodrine.  Endoscopic workup: Colonoscopy 04/05/2022: -Fair prep -2 polyps in the cecum -3 polyps in the transverse colon -8 mm polyp in the descending colon -Nonbleeding internal hemorrhoids -Path: Tubular adenomas -Repeat colonoscopy in 3 years  EGD 08/26/2022: -grade 2 esophageal varices s/p banding x 3 with complete eradication, without bleeding -Single nonbleeding angiodysplastic lesion in the duodenum.  -Advised Carafate suspension 1 g before meals and nightly -Repeat EGD in 4-8 weeks.  EGD 09/24/22: -4 columns of grade 2-3 esophageal varices, cherry red spots 1 on each of 2 columns without active bleeding -Portal hypertensive gastropathy with touch friability, no gastric varices -Microvasive 7 shot Bander patch -1 band placed on each column with good hemostasis -Advise repeat banding in 4 weeks. -Continue PPI  EGD 02/22/2023: -Grade 2 esophageal varices with stigmata of recent bleeding s/p banding -Portal hypertensive gastropathy -No gastric varices -Normal duodenum -Restart Coreg on discharge -Continue Rocephin for 5 days -Continue octreotide  for 3 days -Repeat EGD in 4 weeks  EGD 05/20/2023: - Grade I esophageal varices with no bleeding and no stigmata of recent bleeding.  - Portal hypertensive gastropathy.  - Normal duodenal bulb, first portion of the duodenum and second portion of the duodenum.  - No specimens collected. -Advised 5 days of ceftriaxone, switch PPI to twice daily, advised complete alcohol cessation  Prior imaging: MRI liver with and without contrast 08/03/2022: -Chronic liver disease -Macronodular liver with multiple presumed regenerative nodules -No clear aggressive appearing lesions with restricted diffusion -Mild ascites -Varices on the stomach and lower esophagus -Mild splenic enlargement   Hospitalized at [facility] 08/26/2022 through 08/29/2022: Presented with hematemesis and melena for 3 days.  Noted to have previous admission at the end of January for anasarca.  He underwent EGD for hematemesis and melena during this hospitalization in February.  Procedure outlined below.  He was treated with octreotide and PPI infusion as well as IV Rocephin for SBP prophylaxis.  He was also treated with Carafate 1 g 4 times daily.  His MELD was 27 on admission with a DF of 45.9.  Was treated with prednisolone during his prior hospitalization in January.  Patient has reported intermittent drowsiness and confusion concerning for possible hepatic encephalopathy but has been maintained on lactulose.  Given significant bowel movements while inpatient his lactulose was decreased from 3 times daily to twice daily.  Given he was still having alcohol use prednisolone was not started.  Prior to discharge he was advised to continue Lasix 20 mg daily 100 mg spironolactone daily.  His MELD 3.0 was 22 on discharge.  Per Dr. Levon Hedger, no need for Xifaxan at this time.  Alcohol cessation encouraged. Recommendations on discharge were to  continue Carafate for 2 weeks, PPI twice daily, Lasix 40 mg daily, spironolactone 100 mg daily, lactulose 2-3 bowel  movements daily, carvedilol 3.125 mg twice daily.  Need to arrange outpatient evaluation at transplant center given elevated levels.    Recent hospitalization 09/24/22-09/28/22.  He presented to the ED with hematemesis, having multiple episodes.  Reportedly continued to drink alcohol.  Reports some mild tremors and intermittent confusion but no worse than previous.  Complaining of lower extremity edema and abdominal distention.  History of Rocephin for SBP prophylaxis.  Underwent EGD with Dr. Jena Gauss.  IV twice daily and octreotide infusion.  Continued on lactulose as well as daily diuretic regimen of 40 mg of Lasix and 100 mg of spironolactone.  EGD as outlined above.  Was given vitamin K x 1 dose. Discharged on lasix 40 mg daily and spironolactone 150mg  daily (due to hypokalemia).  Recommended restarting Coreg 3.125 mg twice daily.    OV 10/14/22. Advised to continue current medications including Lasix 40 mg daily, spironolactone 150 mg daily, carvedilol 3.125 twice daily, lactulose, and PPI twice daily.  Start on Xifaxan 550 mg twice daily.  Advised he may use Gas-X as needed for bloating.  Due for liver imaging in July 2024.  Advised to proceed with EGD that was already scheduled. EGD cancelled given patient lost insurance 10/14/22.    OV 11/04/22. Increased N/V. Swelling fairly well controlled. Abdomen tight at times. Eating very little. Poor appetite. Was unsure if taking Xifaxin. 1-2 BM daily. Lactulose twice daily. Insurance reinstated. Advised labs and reschedule EGD. Start Xifaxin. Continue lasix, spironolactone (150) carvedilol 3.125 mg BID. Low sodium diet. Start ensures and follow up with [facility] regarding possible transplant candidacy.    Contacted by heme/onc PA 5/31 regarding patient with dizziness and shortness of breath who presented for iron infusion. He was given 500 cc bolus of NS and CBC checked. Hgb 10.6, plts 174. Last ferritin was 19 (11/11/22).  I was asked for recommendations for  treatment and recommended decreasing spironolactone from 150mg  to 100 mg daily given BP was a little soft and asked to follow up in the office. His BP improved to 123/72 after fluids.Tolerated infusion without issue.   OV 12/07/22. BP has been on the lower side.  Stated it was fine all weekend.  Continues to have issues with insomnia.  Not yet taking his morning medications and not only had a little bit of water.  Not feeling dizzy.  Does feel sleepy.  Swelling has been controlled and denies any abdominal pain.  Taking Xifaxan as directed.  Nausea has been fairly well-controlled with Zofran.  Referral for the last couple weeks his BP has been on the lower side steadily decreasing but overall he has been feeling better than usual and no worsening confusion.  Appetite also improved.  Multiple rechecks of his blood pressure revealed ongoing hypotension therefore he was advised to go to the ED for fluids and further evaluation.  Tried to discuss with hematology however they stated they were unable to accommodate him for fluid administration without going through the ED.  Given his blood pressure he was advised to stop carvedilol.   Hospitalization at [facility] 12/07/22 through 12/08/22.  He is orthostatic BPs were positive and he became mildly symptomatic.  He received IV hydration with fluid boluses.  Labs with AST 53, ALT 20, alk phos 131, T. bili 3.6 with indirect 2.7 and direct 0.9.  Albumin 2.3.  Sodium noted to be low at 126, potassium 3.9,  and creatinine 0.83.  INR elevated at 1.9.  He was also noted to be bradycardic.  Initially held his Aldactone, Lasix, and Coreg.  He received IV fluids and albumin and was given midodrine.  He was advised to restart his Coreg on 6/6 and reduce his spironolactone to 50 mg daily starting on 6/6.  Advised to restart Lasix on Saturday 6/8 and continue to monitor BP at home.  Reportedly scheduled for iron infusion with hematology on 6/7.   OV 03/23/2023 with Dr. Marletta Lor.  Patient continued  to report a few cans of beer weekly.  Taking 20 mg Lasix daily and Aldactone 50 mg daily but notes increased swelling in his abdomen and lower extremities.  Requested paracentesis.  Denied any melena, nausea, or vomiting.  On carvedilol 3.125 mg twice daily.  Hg well-controlled with lactulose and Xifaxan.  Nutritional recommendations given.  Advised to monitor blood pressure at home and call if his blood pressure drops below 90/60.  Continue lactulose and Xifaxan.  Due for repeat hepatoma screening in February 2025 with AFP.  Follow-up in 3-4 weeks.   Hospitalization at [facility] 04/02/2023 - 04/05/2023.  Patient presented with hypotension.  MELD 24, INR elevated at 2.  No encephalopathy on exam continued on lactulose and Xifaxan.  Received paracentesis to rule out SBP.  Chronically elevated AST/ALT with EtOH use.  DF elevated at 48 but steroids not given given noncompliance.  Continued to be hypotensive on presentation and bradycardic in the 50s.  He was feeling dizzy and fatigued on carvedilol 3.125 mg twice daily.  His diuretics and beta-blocker were on hold given hypotension.  He has a past continue midodrine 3 times daily.  Consider restarting Lasix and spironolactone at half dose with close monitoring of BP at home.  Advised to repeat EGD in 4 to 6 weeks until complete eradication of varices.  Advised to continue to hold carvedilol.   Paracentesis 04/04/2023 with removal of 3.9 L of ascites.  No evidence of SBP on cell count.  Last office visit 04/20/23.  Still having peripheral edema and some mild abdominal distention although not as much as previous.  Having intermittent abdominal pain but not taking any NSAIDs.  Reportedly trying to adhere to diet recommendations but does not always follow this diet.  States he is drinking about 1 Ensure per day, sometimes boost.  States he has to eat when he takes medication in order to avoid nausea and vomiting.  Having about 5 bowel movements per day.  Taking lactulose 30  mL twice daily.  Had noticed intermittent melena and darker colored stools but not consistently black.  Denied any BRBPR.  Advised he needed proceed with upper endoscopy on 11/18 with Dr. Marletta Lor.  Continue Lasix 20 and Aldactone 50 daily.  Advised a brief increase in diuretics to Lasix to 40 mg on Monday Wednesday Friday and recheck labs in 2 weeks.  Continue to hold carvedilol, continue midodrine 3 times daily.  Continue Ensure and 2 g sodium diet.  Continue lactulose and Xifaxan.Marland Kitchen  Recent hospitalization 05/19/2023 - 05/24/2023.  He presented with 3-4 days of onset nonbloody vomiting as well as increased abdominal bloating.  He did report a recent alcohol use and was having complaints of blood in his stool.  He was found to be hypoxic on EMS arrival and was given oxygen therapy.  He was found to have a large left-sided pleural effusion on chest x-ray, macrocytic anemia and thrombocytopenia and was admitted to the ICU.  He ultimately underwent  thoracentesis during his admission as well as diagnostic paracentesis given reports of abdominal pain.  Given his low potassium and his hypotension his diuretics were placed on hold.  He was treated with IV ceftriaxone and was on octreotide and Protonix briefly until EGD performed which was unrevealing other than grade 1 esophageal varices and portal hypertensive gastropathy without any signs of active bleeding.  He also underwent full paracentesis during his admission for recurrent ascites.  He was discharged home and advised to have frequent lab checks as well as to get scheduled for as needed paracenteses and to consider thoracentesis every few weeks and given prescription for oxygen at home.  Paracentesis 05/23/23: 5L removed Paracentesis 05/27/23: 4L removed Diagnostic paracentesis 05/20/23: 100 cc removed - negative SBP Thoracentesis 05/20/23: 1500 cc removed from L pleural space  Patient was given referral to pulmonology last week by myself.  Today: Having  insomnia still.  Has thoracentesis today and is due to see pulmonology on 12/4.   Was not miserable when he first got home. Swelling in his abdomen is bothering him the most. He has oxygen at home. When he is standing at home or bends over he feels like he can't breathe. He needs help with dressing his lower extremities due to the swelling.   No alcohol since his discharge. Eating very littl because he constantly feels full. Not really nauseas since discharge. BM about 4 times a day. Taking the lactulose 2 times a day. Tacking 30 ml. Tacking the Xifaxin right.   BP at home has been in the 114/78 range.   [name] does appointment, his sister does his medications. He has not been driving.   Labs 05/30/23: Hgb 11.2, sodium 128  MELD 3.0: 31 at 05/24/2023  4:18 AM MELD-Na: 32 at 05/24/2023  4:18 AM Calculated from: Serum Creatinine: 0.64 mg/dL (Using min of 1 mg/dL) at 29/56/2130  8:65 AM Serum Sodium: 127 mmol/L at 05/24/2023  4:18 AM Total Bilirubin: 17.8 mg/dL at 78/46/9629  5:28 AM Serum Albumin: 2.6 g/dL at 41/32/4401  0:27 AM INR(ratio): 2.7 at 05/24/2023  4:18 AM Age at listing (hypothetical): 57 years Sex: Male at 05/24/2023  4:18 AM   Wt Readings from Last 3 Encounters:  06/01/23 205 lb 6.4 oz (93.2 kg)  05/21/23 202 lb 9.6 oz (91.9 kg)  04/20/23 192 lb 6.4 oz (87.3 kg)    Current Outpatient Medications  Medication Sig Dispense Refill   albuterol (VENTOLIN HFA) 108 (90 Base) MCG/ACT inhaler Inhale 2 puffs into the lungs every 6 (six) hours as needed for wheezing or shortness of breath. 8 g 2   BELSOMRA 10 MG TABS Take 1 tablet by mouth daily.     Budeson-Glycopyrrol-Formoterol (BREZTRI AEROSPHERE) 160-9-4.8 MCG/ACT AERO Inhale 2 puffs into the lungs 2 (two) times daily. 10.7 g 3   busPIRone (BUSPAR) 5 MG tablet Take 1 tablet (5 mg total) by mouth daily as needed (anxiety). 90 tablet 3   Continuous Glucose Sensor (DEXCOM G7 SENSOR) MISC Inject 1 application  into the skin as  directed. Change sensor every 10 days as directed. 9 each 3   feeding supplement (ENSURE ENLIVE / ENSURE PLUS) LIQD Take 237 mLs by mouth 2 (two) times daily between meals. 237 mL 12   FLUoxetine (PROZAC) 10 MG capsule Take 1-2 capsules (10-20 mg total) by mouth See admin instructions. Take 20 mg in the morning, 10 mg at night.     folic acid (FOLVITE) 1 MG tablet Take 1 tablet (1 mg total)  by mouth daily. 90 tablet 3   furosemide (LASIX) 40 MG tablet Take 0.5 tablets (20 mg total) by mouth daily. 15 tablet 3   gabapentin (NEURONTIN) 100 MG capsule Take 100 mg by mouth daily.     glucose blood test strip Use as instructed to monitor glucose 4 times daily 100 each 12   insulin glargine (LANTUS SOLOSTAR) 100 UNIT/ML Solostar Pen Inject 20 Units into the skin at bedtime. 30 mL 3   insulin lispro (HUMALOG KWIKPEN) 100 UNIT/ML KwikPen Inject 8-14 Units into the skin 3 (three) times daily. (Patient taking differently: Inject 10-16 Units into the skin 3 (three) times daily before meals.) 36 mL 3   Insulin Pen Needle (PEN NEEDLES) 32G X 4 MM MISC 1 each by Does not apply route in the morning, at noon, in the evening, and at bedtime. Use to inject insulin 4 times daily 200 each 3   lactulose (CHRONULAC) 10 GM/15ML solution Take 30 mLs (20 g total) by mouth 2 (two) times daily. 946 mL 2   levocetirizine (XYZAL) 5 MG tablet Take 1 tablet by mouth daily.     MAGNESIUM PO Take 1 tablet by mouth daily. Magnesium Taurate     midodrine (PROAMATINE) 5 MG tablet Take 1 tablet (5 mg total) by mouth with breakfast, with lunch, and with evening meal. To help Prevent Low Blood Pressure 90 tablet 2   Multiple Vitamins-Minerals (CENTRUM ADULT PO) Take 1 tablet by mouth daily.     ondansetron (ZOFRAN-ODT) 8 MG disintegrating tablet Take 8 mg by mouth every 8 (eight) hours as needed for nausea or vomiting.     pantoprazole (PROTONIX) 40 MG tablet Take 1 tablet (40 mg total) by mouth 2 (two) times daily. 60 tablet 3    rifaximin (XIFAXAN) 550 MG TABS tablet Take 1 tablet (550 mg total) by mouth 2 (two) times daily. 60 tablet 3   spironolactone (ALDACTONE) 50 MG tablet Take 1 tablet (50 mg total) by mouth daily. 30 tablet 0   thiamine (VITAMIN B-1) 100 MG tablet Take 1 tablet (100 mg total) by mouth daily. 90 tablet 3   No current facility-administered medications for this visit.    Past Medical History:  Diagnosis Date   Anxiety    Arthritis    affects hands, shoulder, neck, knees, hips, ankles, toes   Chronic back pain    Diabetes mellitus    diet controlled   Eczema    Esophageal varices (HCC)    Fatty liver, alcoholic    GERD (gastroesophageal reflux disease)    Glaucoma    slight case   Hypertension    Iron deficiency anemia due to chronic blood loss 11/25/2022   Psoriasis    Sleep apnea    Substance abuse (HCC)    Vertigo 09/11/2017   Wears glasses     Past Surgical History:  Procedure Laterality Date   COLONOSCOPY WITH PROPOFOL N/A 04/05/2022   Procedure: COLONOSCOPY WITH PROPOFOL;  Surgeon: Lanelle Bal, DO;  Location: [facility];  Service: Endoscopy;  Laterality: N/A;  10:45 AM,unable to reach pt to move up   ESOPHAGEAL BANDING N/A 08/26/2022   Procedure: ESOPHAGEAL BANDING;  Surgeon: Lanelle Bal, DO;  Location: [facility];  Service: Endoscopy;  Laterality: N/A;   ESOPHAGEAL BANDING  09/24/2022   Procedure: ESOPHAGEAL BANDING;  Surgeon: Corbin Ade, MD;  Location: [facility];  Service: Endoscopy;;   ESOPHAGEAL BANDING  02/22/2023   Procedure: ESOPHAGEAL BANDING;  Surgeon:  Dolores Frame, MD;  Location: [facility];  Service: Gastroenterology;;   ESOPHAGEAL BANDING  05/20/2023   Procedure: ESOPHAGEAL BANDING;  Surgeon: Lanelle Bal, DO;  [facility]: [facility];  Service: Endoscopy;;   ESOPHAGOGASTRODUODENOSCOPY (EGD) WITH PROPOFOL N/A 08/26/2022   Procedure: ESOPHAGOGASTRODUODENOSCOPY (EGD) WITH PROPOFOL;  Surgeon: Lanelle Bal, DO;   [facility]: [facility];  Service: Endoscopy;  Laterality: N/A;   ESOPHAGOGASTRODUODENOSCOPY (EGD) WITH PROPOFOL N/A 09/24/2022   Procedure: ESOPHAGOGASTRODUODENOSCOPY (EGD) WITH PROPOFOL;  Surgeon: Corbin Ade, MD;  Location: [facility];  Service: Endoscopy;  Laterality: N/A;  hematemesis, esophageal varices, melena   ESOPHAGOGASTRODUODENOSCOPY (EGD) WITH PROPOFOL N/A 02/22/2023   Procedure: ESOPHAGOGASTRODUODENOSCOPY (EGD) WITH PROPOFOL;  Surgeon: Dolores Frame, MD;  [facility]: [facility];  Service: Gastroenterology;  Laterality: N/A;   ESOPHAGOGASTRODUODENOSCOPY (EGD) WITH PROPOFOL N/A 05/20/2023   Procedure: ESOPHAGOGASTRODUODENOSCOPY (EGD) WITH PROPOFOL;  Surgeon: Lanelle Bal, DO;  Location: [facility];  Service: Endoscopy;  Laterality: N/A;   MULTIPLE TOOTH EXTRACTIONS     POLYPECTOMY  04/05/2022   Procedure: POLYPECTOMY;  Surgeon: Lanelle Bal, DO;  [facility]: [facility];  Service: Endoscopy;;   POSTERIOR CERVICAL LAMINECTOMY  04/21/2012   Procedure: POSTERIOR CERVICAL LAMINECTOMY;  Surgeon: Mariam Dollar, MD;  Location: [facility];  Service: Neurosurgery;  Laterality: Left;  Posterior Cervical laminectomy/foraminotomy and diskectomy, left cervical seven-thoracic one   ROTATOR CUFF REPAIR     THORACIC DISCECTOMY Left 04/03/2014   Procedure: Left Thoracic Seven to Eight, Thoracic Eight to Nine Thoracic Discectomy ;  Surgeon: Mariam Dollar, MD;  Location: [facility];  Service: Neurosurgery;  Laterality: Left;    Family History  Problem Relation Age of Onset   Diabetes Father    Cancer - Lung Father    Hypertension Other    Cancer - Lung Other     Allergies as of 06/01/2023 - Review Complete 06/01/2023  Allergen Reaction Noted   Neosporin + pain relief max st [neomy-bacit-polymyx-pramoxine] Rash 04/19/2012    Social History   Socioeconomic History   Marital status: Divorced    Spouse name: Not on file   Number of children: Not on file   Years  of education: Not on file   Highest education level: Not on file  Occupational History    Comment: Truck to storage  Tobacco Use   Smoking status: Every Day    Current packs/day: 0.00    Average packs/day: 0.3 packs/day for 40.0 years (10.0 ttl pk-yrs)    Types: Cigarettes    Start date: 05/09/1982    Last attempt to quit: 05/09/2022    Years since quitting: 1.0   Smokeless tobacco: Never   Tobacco comments:    pt trying to quit on his own  Vaping Use   Vaping status: Former  Substance and Sexual Activity   Alcohol use: Not Currently    Alcohol/week: 12.0 standard drinks of alcohol    Types: 12 Cans of beer per week    Comment: last drink 2 beers on Sunday 02/20/2023   Drug use: No   Sexual activity: Yes    Birth control/protection: None  Other Topics Concern   Not on file  Social History Narrative   Not on file   Social Determinants of Health   Financial Resource Strain: Low Risk  (03/02/2023)   Overall Financial Resource Strain (CARDIA)    Difficulty of Paying Living Expenses: Not very hard  Food Insecurity: No Food Insecurity (05/18/2023)  Hunger Vital Sign    Worried About Running Out of Food in the Last Year: Never true    Ran Out of Food in the Last Year: Never true  Transportation Needs: No Transportation Needs (05/18/2023)   PRAPARE - Administrator, Civil Service (Medical): No    Lack of Transportation (Non-Medical): No  Physical Activity: Not on file  Stress: Stress Concern Present (10/07/2022)   [employer] of Occupational Health - Occupational Stress Questionnaire    Feeling of Stress : To some extent  Social Connections: Not on file     Review of Systems   Gen: Denies fever, chills, anorexia. Denies fatigue, weakness, weight loss.  CV: Denies chest pain, palpitations, syncope, peripheral edema, and claudication. Resp: Denies dyspnea at rest, cough, wheezing, coughing up blood, and pleurisy. GI: See HPI Derm: Denies rash, itching, dry  skin Psych: Denies depression, anxiety, memory loss, confusion. No homicidal or suicidal ideation.  Heme: Denies bruising, bleeding, and enlarged lymph nodes.  Physical Exam   BP 118/75 (BP Location: Right Arm, Patient Position: Sitting, Cuff Size: Large)   Pulse (!) 108   Temp 97.6 F (36.4 C) (Temporal)   Ht 5\' 6"  (1.676 m)   Wt 205 lb 6.4 oz (93.2 kg)   BMI 33.15 kg/m   General:   Alert and oriented. Pleasant.  Jaundice.  Ill-appearing. Head:  Normocephalic and atraumatic. Eyes: Scleral icterus Lungs:  Clear to auscultation bilaterally, significantly diminished to LLL  Heart:  S1, S2 present without murmurs appreciated.  Abdomen:  +BS, soft. Mild generalized ttp. Significant distention. Umbilical hernia present and soft. No rebound or guarding. Rectal: deferred Msk:  Symmetrical without gross deformities. Normal posture. Extremities:  non pitting significant edema BLE Neurologic:  Alert and  oriented x4. Mild tremor with mild asterixis.  Psych:  Alert and cooperative. Normal mood and affect.  Assessment  Shawn Velazquez is a 57 y.o. male with a history of GERD, anemia, diabetes, HTN (now with hypotension requiring midodrine), alcohol abuse with alcoholic hepatitis as well as decompensated alcoholic cirrhosis complicated by portal hypertension, esophageal variceal bleeding, ascites, hydrothorax, and HE and not currently a transplant candidate presenting today for hospital follow-up.  Decompensated alcoholic cirrhosis, alcoholic hepatitis, hydrothorax, upper GI bleed:  MELD 31/32 since admission and discharge.  Outpatient labs for Monday with pending INR.  She PCP.  Had admitted to some previous intermittent alcohol use prior to recent admission.  Today significant other and patient both admit to no alcohol use since discharge.  Has had multiple recent upper endoscopies with repeated esophageal varices, most recently grade 1 without intervention completed.  Has ongoing portal  hypertensive gastropathy which is likely been causing intermittent using and melena.  Recently treated with 1 g Rocephin for SBP prophylaxis.  Underwent diagnostic and therapeutic paracentesis while inpatient.  Has had recurrent development of ascites since discharge and very told today, will see if we can get a paracentesis for Friday instead of Wednesday next week.  He is undergoing thoracentesis this week given hydrothorax and is due to see pulmonology next week.  He has been compliant with lactulose and Xifaxan.  Not a transplant candidate for [facility] given ongoing alcohol use.  Advised to complete labs next week and we will continue weekly paracenteses given we cannot restart diuretics given borderline low blood pressures as well as hyponatremia. (Recent sodium 128).  Due for hepatoma screening in February.  Discussed with patient and significant other today extensively regarding consideration of palliative care  and discussing that his prognosis is guarded.  In detail today we discussed options including palliative care, hospice, and potentially pursuing TIPS although I expressed to them that given his MELD score this may not be a possibility.  We also discussed that he is not a liver transplant candidate.  GERD: Fairly well-controlled with pantoprazole 40 mg twice daily.  Recently not with any severe nausea or vomiting.  IDA: Recent hemoglobin stable at 11.  Endoscopic workup outlined in HPI.  PLAN   CBC, CMP, INR next week. Weekly therapeutic paracentesis as needed, remove no more than 6 L Pulmonology for as needed thoracentesis, has thoracentesis scheduled today. 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  Continue lactulose and Xifaxin Continue pantoprazole 40 mg twice daily Avoid NSAIDs 2g sodium  diet Continue Ensure Follow-up in 4 weeks  Spent 50 minutes face to face with patient today,> 75% of visit time spent discussing plan of care and providing patient education. Spent an additional 12 minutes on after visit activities.   Brooke Bonito, MSN, FNP-BC, AGACNP-BC [facility]

## 2023-06-01 ENCOUNTER — Encounter: Payer: Self-pay | Admitting: Gastroenterology

## 2023-06-01 ENCOUNTER — Ambulatory Visit: Payer: BC Managed Care – PPO | Admitting: Gastroenterology

## 2023-06-01 ENCOUNTER — Ambulatory Visit (HOSPITAL_COMMUNITY)
Admission: RE | Admit: 2023-06-01 | Discharge: 2023-06-01 | Disposition: A | Discharge status: Home / Self Care | Payer: BC Managed Care – PPO | Source: Ambulatory Visit | Attending: Interventional Radiology

## 2023-06-01 ENCOUNTER — Encounter (HOSPITAL_COMMUNITY): Payer: Self-pay

## 2023-06-01 ENCOUNTER — Ambulatory Visit (HOSPITAL_COMMUNITY): Admission: RE | Admit: 2023-06-01 | Payer: BC Managed Care – PPO | Source: Ambulatory Visit

## 2023-06-01 ENCOUNTER — Ambulatory Visit (HOSPITAL_COMMUNITY)
Admission: RE | Admit: 2023-06-01 | Discharge: 2023-06-01 | Disposition: A | Discharge status: Home / Self Care | Payer: BC Managed Care – PPO | Source: Ambulatory Visit | Attending: Internal Medicine | Admitting: Internal Medicine

## 2023-06-01 ENCOUNTER — Other Ambulatory Visit (HOSPITAL_COMMUNITY): Payer: Self-pay | Admitting: Internal Medicine

## 2023-06-01 ENCOUNTER — Other Ambulatory Visit: Payer: Self-pay | Admitting: *Deleted

## 2023-06-01 VITALS — BP 118/75 | HR 108 | Temp 97.6°F | Ht 66.0 in | Wt 205.4 lb

## 2023-06-01 DIAGNOSIS — K7010 Alcoholic hepatitis without ascites: Secondary | ICD-10-CM

## 2023-06-01 DIAGNOSIS — K219 Gastro-esophageal reflux disease without esophagitis: Secondary | ICD-10-CM

## 2023-06-01 DIAGNOSIS — K7031 Alcoholic cirrhosis of liver with ascites: Secondary | ICD-10-CM

## 2023-06-01 DIAGNOSIS — F1020 Alcohol dependence, uncomplicated: Secondary | ICD-10-CM

## 2023-06-01 DIAGNOSIS — J948 Other specified pleural conditions: Secondary | ICD-10-CM | POA: Diagnosis not present

## 2023-06-01 DIAGNOSIS — D500 Iron deficiency anemia secondary to blood loss (chronic): Secondary | ICD-10-CM

## 2023-06-01 DIAGNOSIS — D509 Iron deficiency anemia, unspecified: Secondary | ICD-10-CM

## 2023-06-01 DIAGNOSIS — I959 Hypotension, unspecified: Secondary | ICD-10-CM

## 2023-06-01 DIAGNOSIS — I8511 Secondary esophageal varices with bleeding: Secondary | ICD-10-CM

## 2023-06-01 DIAGNOSIS — J90 Pleural effusion, not elsewhere classified: Secondary | ICD-10-CM | POA: Diagnosis not present

## 2023-06-01 LAB — BODY FLUID CELL COUNT WITH DIFFERENTIAL
Eos, Fluid: 0 %
Lymphs, Fluid: 24 %
Monocyte-Macrophage-Serous Fluid: 70 % (ref 50–90)
Neutrophil Count, Fluid: 6 % (ref 0–25)
Total Nucleated Cell Count, Fluid: 114 uL (ref 0–1000)

## 2023-06-01 MED ORDER — LACTULOSE 10 GM/15ML PO SOLN: 20.0000 g | Freq: Two times a day (BID) | ORAL | 6 refills | Status: DC

## 2023-06-01 MED ORDER — LIDOCAINE HCL (PF) 2 % IJ SOLN
INTRAMUSCULAR | Status: AC
  Filled 2023-06-01: qty 10

## 2023-06-01 MED ORDER — LACTULOSE 10 GM/15ML PO SOLN: 20.0000 g | Freq: Two times a day (BID) | ORAL | 0 refills | Status: DC

## 2023-06-01 MED ORDER — LIDOCAINE HCL (PF) 2 % IJ SOLN
10.0000 mL | Freq: Once | INTRAMUSCULAR | Status: AC
  Administered 2023-06-01: 10 mL

## 2023-06-01 NOTE — Patient Instructions (Addendum)
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  Our office will send a note with his office visit today requesting that he consider sending in the palliative care consult.  I do encourage you to reach out to him as well.  We will send a referral for TIPS today to see if he would be a candidate.  Complete alcohol cessation is vital in your case.  For now we are can continue to hold diuretics given your sodium is low.  We will try to arrange for your paracentesis to be on Friday instead of Wednesday next week.  Continue current medications as ordered including your lactulose and Xifaxan.  Cirrhosis Lifestyle Recommendations:  High-protein diet from a primarily plant-based diet. Avoid red meat.  No raw or undercooked meat, seafood, or shellfish. Low-fat/cholesterol/carbohydrate diet. Limit sodium to no more than 2000 mg/day including everything that you eat and drink. Recommend at least 30 minutes of aerobic and resistance exercise 3 days/week. Limit Tylenol to 2000 mg daily.  Continue your protein shakes daily as tolerated  Despite having a low sodium level in your blood it is imperative that you follow a 2 g sodium diet to prevent further fluid development in your legs and abdomen.  I will see you again in about 4 weeks!  It was a pleasure to see you today. I want to create trusting relationships with patients. If you receive a survey regarding your visit,  I greatly appreciate you taking time to fill this out on paper or through your MyChart. I value your feedback.  Brooke Bonito, MSN, FNP-BC, AGACNP-BC [facility]

## 2023-06-01 NOTE — Progress Notes (Signed)
Left sided thoracentesis procedure complete at this time and 2 Liters of pleural fluid removed with labs collected and sent to lab for processing. Patient verbalized understanding of discharge instructions and left via wheelchair with family at this time with no acute distress noted.

## 2023-06-02 LAB — CULTURE, BODY FLUID W GRAM STAIN -BOTTLE: Culture: NO GROWTH

## 2023-06-03 ENCOUNTER — Ambulatory Visit: Payer: Self-pay | Admitting: *Deleted

## 2023-06-03 ENCOUNTER — Ambulatory Visit (HOSPITAL_COMMUNITY)
Admission: RE | Admit: 2023-06-03 | Discharge: 2023-06-03 | Disposition: A | Discharge status: Home / Self Care | Payer: BC Managed Care – PPO | Source: Ambulatory Visit | Attending: Internal Medicine | Admitting: Internal Medicine

## 2023-06-03 ENCOUNTER — Ambulatory Visit (HOSPITAL_COMMUNITY): Payer: BC Managed Care – PPO

## 2023-06-03 ENCOUNTER — Encounter (HOSPITAL_COMMUNITY): Payer: Self-pay

## 2023-06-03 ENCOUNTER — Ambulatory Visit (HOSPITAL_COMMUNITY): Admission: RE | Admit: 2023-06-03 | Payer: BC Managed Care – PPO | Source: Ambulatory Visit

## 2023-06-03 DIAGNOSIS — K7031 Alcoholic cirrhosis of liver with ascites: Secondary | ICD-10-CM | POA: Insufficient documentation

## 2023-06-03 LAB — BODY FLUID CELL COUNT WITH DIFFERENTIAL
Eos, Fluid: 0 %
Lymphs, Fluid: 64 %
Monocyte-Macrophage-Serous Fluid: 26 % — ABNORMAL LOW (ref 50–90)
Neutrophil Count, Fluid: 10 % (ref 0–25)
Other Cells, Fluid: NONE SEEN %
Total Nucleated Cell Count, Fluid: 71 uL (ref 0–1000)

## 2023-06-03 LAB — GRAM STAIN

## 2023-06-03 NOTE — Patient Outreach (Signed)
  Care Coordination   06/03/2023 Name: Shawn Velazquez MRN: [id number] DOB: 10/13/1965   Care Coordination Outreach Attempts:  An unsuccessful telephone outreach was attempted for a scheduled appointment today.  Follow Up Plan:  Additional outreach attempts will be made to offer the patient care coordination information and services.   Encounter Outcome:  No Answer   Care Coordination Interventions:  No, not indicated    Alexy Bringle L. Noelle Penner, RN, BSN, [facility]  VBCI Care Management Coordinator  [phone]  Fax: [phone]

## 2023-06-03 NOTE — Progress Notes (Signed)
Patient tolerated left sided paracentesis procedure well today and 4 Liters of clear  yellow ascites removed with labs collected and sent for processing. Patient verbalized understanding of discharge instructions and left via wheelchair with family with no acute distress noted.

## 2023-06-05 DEATH — deceased

## 2023-06-06 ENCOUNTER — Other Ambulatory Visit: Payer: BC Managed Care – PPO

## 2023-06-06 LAB — PATHOLOGIST SMEAR REVIEW

## 2023-06-08 ENCOUNTER — Institutional Professional Consult (permissible substitution): Payer: BC Managed Care – PPO | Admitting: Internal Medicine

## 2023-06-08 ENCOUNTER — Ambulatory Visit (HOSPITAL_COMMUNITY): Admission: RE | Admit: 2023-06-08 | Payer: BC Managed Care – PPO | Source: Ambulatory Visit

## 2023-06-08 LAB — CULTURE, BODY FLUID W GRAM STAIN -BOTTLE: Culture: NO GROWTH

## 2023-06-14 ENCOUNTER — Ambulatory Visit: Payer: Self-pay | Admitting: *Deleted

## 2023-06-14 NOTE — Patient Outreach (Signed)
  Care Coordination   06/14/2023 Name: Shawn Velazquez MRN: [id number] DOB: 1966-01-11   Care Coordination Outreach Attempts:  An unsuccessful telephone outreach was attempted today to offer the patient information about available care coordination services.  Follow Up Plan:  Additional outreach attempts will be made to offer the patient care coordination information and services.   Encounter Outcome:  No Answer   Care Coordination Interventions:  Yes, provided  case closure passed Jul 01, 2023   Lena Gores L. Noelle Penner, RN, BSN, [facility]  VBCI Care Management Coordinator  [phone]  Fax: [phone]

## 2023-06-14 NOTE — Patient Instructions (Signed)
case closure passed Jun 23, 2023

## 2023-07-01 ENCOUNTER — Ambulatory Visit: Payer: BC Managed Care – PPO | Admitting: Nurse Practitioner

## 2023-07-04 ENCOUNTER — Inpatient Hospital Stay: Payer: BC Managed Care – PPO

## 2023-07-11 ENCOUNTER — Inpatient Hospital Stay: Payer: BC Managed Care – PPO

## 2023-07-11 ENCOUNTER — Inpatient Hospital Stay: Payer: BC Managed Care – PPO | Admitting: Physician Assistant
